# Patient Record
Sex: Female | Born: 1990 | Hispanic: No | Marital: Single | Smoking: Never smoker
Health system: Southern US, Community
[De-identification: ages and names within clinical notes are randomized; demographics above are authoritative.]

## PROBLEM LIST (undated history)

## (undated) DIAGNOSIS — G932 Benign intracranial hypertension: Secondary | ICD-10-CM

## (undated) DIAGNOSIS — Z789 Other specified health status: Secondary | ICD-10-CM

## (undated) DIAGNOSIS — D649 Anemia, unspecified: Secondary | ICD-10-CM

## (undated) DIAGNOSIS — O1410 Severe pre-eclampsia, unspecified trimester: Secondary | ICD-10-CM

## (undated) DIAGNOSIS — D6859 Other primary thrombophilia: Secondary | ICD-10-CM

## (undated) DIAGNOSIS — I82409 Acute embolism and thrombosis of unspecified deep veins of unspecified lower extremity: Secondary | ICD-10-CM

## (undated) DIAGNOSIS — Z86711 Personal history of pulmonary embolism: Secondary | ICD-10-CM

## (undated) DIAGNOSIS — I639 Cerebral infarction, unspecified: Secondary | ICD-10-CM

## (undated) HISTORY — DX: Acute embolism and thrombosis of unspecified deep veins of unspecified lower extremity: I82.409

## (undated) HISTORY — PX: NO PAST SURGERIES: SHX2092

---
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
## 2012-10-23 ENCOUNTER — Emergency Department (HOSPITAL_BASED_OUTPATIENT_CLINIC_OR_DEPARTMENT_OTHER)
Admission: EM | Admit: 2012-10-23 | Discharge: 2012-10-23 | Disposition: A | Discharge status: Home / Self Care | Payer: Self-pay | Attending: Emergency Medicine | Admitting: Emergency Medicine

## 2012-10-23 ENCOUNTER — Encounter (HOSPITAL_BASED_OUTPATIENT_CLINIC_OR_DEPARTMENT_OTHER): Payer: Self-pay | Admitting: *Deleted

## 2012-10-23 ENCOUNTER — Other Ambulatory Visit: Payer: Self-pay

## 2012-10-23 DIAGNOSIS — R55 Syncope and collapse: Secondary | ICD-10-CM | POA: Insufficient documentation

## 2012-10-23 LAB — CBC WITH DIFFERENTIAL/PLATELET
Basophils Absolute: 0.1 10*3/uL (ref 0.0–0.1)
Basophils Relative: 1 % (ref 0–1)
Eosinophils Absolute: 0.5 10*3/uL (ref 0.0–0.7)
Eosinophils Relative: 7 % — ABNORMAL HIGH (ref 0–5)
HCT: 41.5 % (ref 36.0–46.0)
Hemoglobin: 13.3 g/dL (ref 12.0–15.0)
Lymphocytes Relative: 27 % (ref 12–46)
Lymphs Abs: 1.8 10*3/uL (ref 0.7–4.0)
MCH: 26.3 pg (ref 26.0–34.0)
MCHC: 32 g/dL (ref 30.0–36.0)
MCV: 82.2 fL (ref 78.0–100.0)
Monocytes Absolute: 0.7 10*3/uL (ref 0.1–1.0)
Monocytes Relative: 11 % (ref 3–12)
Neutro Abs: 3.7 10*3/uL (ref 1.7–7.7)
Neutrophils Relative %: 55 % (ref 43–77)
Platelets: 317 10*3/uL (ref 150–400)
RBC: 5.05 MIL/uL (ref 3.87–5.11)
RDW: 14.6 % (ref 11.5–15.5)
WBC: 6.7 10*3/uL (ref 4.0–10.5)

## 2012-10-23 LAB — URINE MICROSCOPIC-ADD ON

## 2012-10-23 LAB — BASIC METABOLIC PANEL WITH GFR
BUN: 8 mg/dL (ref 6–23)
CO2: 26 meq/L (ref 19–32)
Calcium: 10.3 mg/dL (ref 8.4–10.5)
Chloride: 101 meq/L (ref 96–112)
Creatinine, Ser: 0.7 mg/dL (ref 0.50–1.10)
GFR calc Af Amer: >90 mL/min
GFR calc non Af Amer: >90 mL/min
Glucose, Bld: 88 mg/dL (ref 70–99)
Potassium: 3.8 meq/L (ref 3.5–5.1)
Sodium: 139 meq/L (ref 135–145)

## 2012-10-23 LAB — URINALYSIS, ROUTINE W REFLEX MICROSCOPIC
Bilirubin Urine: NEGATIVE
Glucose, UA: NEGATIVE mg/dL
Hgb urine dipstick: NEGATIVE
Ketones, ur: NEGATIVE mg/dL
Nitrite: NEGATIVE
Protein, ur: NEGATIVE mg/dL
Specific Gravity, Urine: 1.009 (ref 1.005–1.030)
Urobilinogen, UA: 0.2 mg/dL (ref 0.0–1.0)
pH: 7 (ref 5.0–8.0)

## 2012-10-23 LAB — HCG, QUANTITATIVE, PREGNANCY: hCG, Beta Chain, Quant, S: 1 m[IU]/mL (ref <5)

## 2012-10-23 MED ORDER — SODIUM CHLORIDE 0.9 % IV BOLUS (SEPSIS)
1000.0000 mL | Freq: Once | INTRAVENOUS | Status: AC
  Administered 2012-10-23: 1000 mL via INTRAVENOUS
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
## 2012-10-23 NOTE — ED Notes (Signed)
Per ems report of witnesses on scene, pt slid out of her chair onto the floor while at work, laid down on the floor and refused to speak to anyone. Pt responds to ammonia capsule waved under her nose, but will not answer questions or follow commands. Iv access obtained and ekg performed while pt being triaged by this rn. 3 family members now at bedside. Pt cont refuse to speak to family or this rn.

## 2012-10-23 NOTE — ED Notes (Signed)
Pt is crying, comforted by her family members.

## 2012-10-23 NOTE — ED Notes (Signed)
Pt is now awake and alert, talking with her family members. Pt denies any pain or any other c/o.

## 2012-10-23 NOTE — ED Notes (Signed)
Patient ambulates to restroom & around RN station with emt without difficulty.

## 2012-10-23 NOTE — ED Notes (Signed)
When asked if she has any pain, pt nods head to indicate "no".

## 2012-10-23 NOTE — ED Notes (Signed)
Pt to room 9 by ems via stretcher. Pt refuses to open her eyes or follow commands, when arm is raised over her head and dropped, pt moves arm to fall onto chest. Ammonia ampule waved under pt nose, she opens her eyes and states "where am I?" pt is now awake and alert, moe + x 4 ext. , resp are easy and regular.
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
## 2015-01-04 ENCOUNTER — Encounter: Payer: Self-pay | Admitting: *Deleted

## 2015-01-04 ENCOUNTER — Ambulatory Visit (INDEPENDENT_AMBULATORY_CARE_PROVIDER_SITE_OTHER): Payer: 59 | Admitting: Family Medicine

## 2015-01-04 ENCOUNTER — Ambulatory Visit (INDEPENDENT_AMBULATORY_CARE_PROVIDER_SITE_OTHER): Payer: 59

## 2015-01-04 VITALS — BP 96/64 | HR 74 | Temp 98.0°F | Resp 16 | Ht 64.5 in | Wt 115.0 lb

## 2015-01-04 DIAGNOSIS — M79645 Pain in left finger(s): Secondary | ICD-10-CM

## 2015-01-04 DIAGNOSIS — S60012A Contusion of left thumb without damage to nail, initial encounter: Secondary | ICD-10-CM

## 2015-01-04 NOTE — Progress Notes (Signed)
  Subjective:  Patient ID: Andrea Hayes, female    DOB: 12/08/1990  Age: 24 y.o. MRN: [id number]  24 year old lady who is from [place]. She works doing Education officer, environmentalcleaning. Last night at about 8 PM she closed a door on her left thumb. It is swollen some and been very painful. She is not scheduled to work today. She told her managers, and has been in phone conversation with them today, but workers compensation has not yet been filed apparently.  Menstrual cycle onset last Thursday. Denies pregnancy.   Objective:   Very tender DIP joint of the left thumb and very tender around the tip of the finger including the nailbed. The nail does not appear to be a viral syndrome at all. There is a small puncture wound in the flash of the palmar surface of the thumb tip. No other lacerations. Neurovascular appears to be intact. Too tender to really test the strength.  Assessment & Plan:   Assessment: Contusion and pain left thumb  Plan:  [facility] reading (PRIMARY) by  Dr. Alwyn RenHopper No fracture  Advised her that she needs to go ahead and make sure that she is properly filed for a workers compensation since this appears to be a workers comp injury..   There are no Patient Instructions on file for this visit.   Gianno Volner, MD 01/04/2015

## 2015-01-04 NOTE — Patient Instructions (Addendum)
I advise you to speak to your boss about getting this filed on workers compensation since it occurred at work.  Apply a baggy of ice to the fingertip for 15-30 minutes 3-4 times daily  Take ibuprofen 800 mg (available over-the-counter, 200 mg 4 equals 800 mg) 3 times daily for pain. Take with food   If you are continuing to have a great deal of pain plan to stay off work on Thursday and return Friday for a recheck  Wear the thumb protector until you're thumb is feeling better so you do not reinjure it.

## 2017-03-09 ENCOUNTER — Inpatient Hospital Stay (HOSPITAL_COMMUNITY)
Admission: AD | Admit: 2017-03-09 | Discharge: 2017-03-09 | Disposition: A | Discharge status: Home / Self Care | Payer: Self-pay | Source: Ambulatory Visit | Attending: Obstetrics & Gynecology | Admitting: Obstetrics & Gynecology

## 2017-03-09 ENCOUNTER — Encounter (HOSPITAL_COMMUNITY): Payer: Self-pay

## 2017-03-09 DIAGNOSIS — R1030 Lower abdominal pain, unspecified: Secondary | ICD-10-CM

## 2017-03-09 DIAGNOSIS — N926 Irregular menstruation, unspecified: Secondary | ICD-10-CM

## 2017-03-09 DIAGNOSIS — R109 Unspecified abdominal pain: Secondary | ICD-10-CM | POA: Insufficient documentation

## 2017-03-09 DIAGNOSIS — Z3202 Encounter for pregnancy test, result negative: Secondary | ICD-10-CM | POA: Insufficient documentation

## 2017-03-09 LAB — URINALYSIS, ROUTINE W REFLEX MICROSCOPIC
Bilirubin Urine: NEGATIVE
Glucose, UA: NEGATIVE mg/dL
Hgb urine dipstick: NEGATIVE
Ketones, ur: NEGATIVE mg/dL
Nitrite: NEGATIVE
Protein, ur: NEGATIVE mg/dL
Specific Gravity, Urine: 1.017 (ref 1.005–1.030)
pH: 7 (ref 5.0–8.0)

## 2017-03-09 LAB — WET PREP, GENITAL
Clue Cells Wet Prep HPF POC: NONE SEEN
Sperm: NONE SEEN
Trich, Wet Prep: NONE SEEN
Yeast Wet Prep HPF POC: NONE SEEN

## 2017-03-09 LAB — POCT PREGNANCY, URINE: Preg Test, Ur: NEGATIVE

## 2017-03-09 NOTE — Discharge Instructions (Signed)

## 2017-03-09 NOTE — MAU Provider Note (Signed)
History   nulparous in with abd pain that has been going on for apx 1 month. States her periods are not reg and she often skips periods. She has never been pregnant. States was on antibiotic and now has severe vag itching.  CSN: [id number]  Arrival date & time 03/09/17  1227   None     Chief Complaint  Patient presents with  . Abdominal Pain  . Vaginal Itching    HPI  No past medical history on file.  No past surgical history on file.  No family history on file.  Social History  Substance Use Topics  . Smoking status: Never Smoker  . Smokeless tobacco: Not on file  . Alcohol use Not on file    OB History    No data available      Review of Systems  Constitutional: Negative.   HENT: Negative.   Eyes: Negative.   Respiratory: Negative.   Cardiovascular: Negative.   Gastrointestinal: Positive for abdominal pain.  Endocrine: Negative.   Genitourinary: Positive for vaginal discharge.       Vag itching.  Musculoskeletal: Negative.   Skin: Negative.   Allergic/Immunologic: Negative.   Neurological: Negative.   Hematological: Negative.   Psychiatric/Behavioral: Negative.     Allergies  Patient has no known allergies.  Home Medications    BP 113/73   Pulse 80   Temp 97.8 F (36.6 C) (Oral)   Resp 16   Ht 5\' 3"  (1.6 m)   Wt 140 lb (63.5 kg)   LMP 01/14/2017   BMI 24.80 kg/m   Physical Exam  Constitutional: She is oriented to person, place, and time. She appears well-developed and well-nourished.  HENT:  Head: Normocephalic.  Eyes: Pupils are equal, round, and reactive to light.  Neck: Normal range of motion.  Cardiovascular: Normal rate, regular rhythm, normal heart sounds and intact distal pulses.   Pulmonary/Chest: Effort normal and breath sounds normal.  Abdominal: Soft. Bowel sounds are normal.  Genitourinary: Vagina normal and uterus normal.  Musculoskeletal: Normal range of motion.  Neurological: She is alert and oriented to person, place,  and time. She has normal reflexes.  Skin: Skin is warm and dry.  Psychiatric: She has a normal mood and affect. Her behavior is normal. Judgment and thought content normal.    MAU Course  Procedures (including critical care time)  Labs Reviewed  WET PREP, GENITAL  URINALYSIS, ROUTINE W REFLEX MICROSCOPIC  POCT PREGNANCY, URINE  GC/CHLAMYDIA PROBE AMP ([place]) NOT AT [facility]   No results found.   No diagnosis found.    MDM  Wet prep neg , preg test neg, VSS, exam WNL. Cultures obtained. Will d/c home to follow up with primary care.

## 2017-03-09 NOTE — MAU Note (Addendum)
Pt. States she has not had a menstrual cycle since July 16,2018. Currently she is having pain in her abdominal area and vaginal itching that started about a month ago.

## 2017-03-11 LAB — GC/CHLAMYDIA PROBE AMP (~~LOC~~) NOT AT ARMC
Chlamydia: NEGATIVE
Neisseria Gonorrhea: NEGATIVE

## 2017-07-13 ENCOUNTER — Other Ambulatory Visit: Payer: Self-pay

## 2017-07-13 ENCOUNTER — Encounter (HOSPITAL_COMMUNITY): Payer: Self-pay | Admitting: Emergency Medicine

## 2017-07-13 ENCOUNTER — Emergency Department (HOSPITAL_COMMUNITY): Payer: Self-pay

## 2017-07-13 ENCOUNTER — Emergency Department (HOSPITAL_COMMUNITY)
Admission: EM | Admit: 2017-07-13 | Discharge: 2017-07-13 | Disposition: A | Discharge status: Home / Self Care | Payer: Self-pay | Attending: Emergency Medicine | Admitting: Emergency Medicine

## 2017-07-13 DIAGNOSIS — K5900 Constipation, unspecified: Secondary | ICD-10-CM | POA: Insufficient documentation

## 2017-07-13 DIAGNOSIS — R1084 Generalized abdominal pain: Secondary | ICD-10-CM | POA: Insufficient documentation

## 2017-07-13 DIAGNOSIS — K5909 Other constipation: Secondary | ICD-10-CM

## 2017-07-13 LAB — COMPREHENSIVE METABOLIC PANEL
ALT: 23 U/L (ref 14–54)
AST: 24 U/L (ref 15–41)
Albumin: 3.7 g/dL (ref 3.5–5.0)
Alkaline Phosphatase: 51 U/L (ref 38–126)
Anion gap: 8 (ref 5–15)
BUN: 5 mg/dL — ABNORMAL LOW (ref 6–20)
CO2: 21 mmol/L — ABNORMAL LOW (ref 22–32)
Calcium: 9.2 mg/dL (ref 8.9–10.3)
Chloride: 104 mmol/L (ref 101–111)
Creatinine, Ser: 0.7 mg/dL (ref 0.44–1.00)
GFR calc Af Amer: >60 mL/min (ref >60)
GFR calc non Af Amer: >60 mL/min (ref >60)
Glucose, Bld: 89 mg/dL (ref 65–99)
Potassium: 4.2 mmol/L (ref 3.5–5.1)
Sodium: 133 mmol/L — ABNORMAL LOW (ref 135–145)
Total Bilirubin: 1.2 mg/dL (ref 0.3–1.2)
Total Protein: 7.5 g/dL (ref 6.5–8.1)

## 2017-07-13 LAB — URINALYSIS, ROUTINE W REFLEX MICROSCOPIC
Bilirubin Urine: NEGATIVE
Glucose, UA: NEGATIVE mg/dL
Hgb urine dipstick: NEGATIVE
Ketones, ur: NEGATIVE mg/dL
Leukocytes, UA: NEGATIVE
Nitrite: NEGATIVE
Protein, ur: NEGATIVE mg/dL
Specific Gravity, Urine: 1.035 — ABNORMAL HIGH (ref 1.005–1.030)
pH: 6 (ref 5.0–8.0)

## 2017-07-13 LAB — CBC
HCT: 42.1 % (ref 36.0–46.0)
Hemoglobin: 13.7 g/dL (ref 12.0–15.0)
MCH: 28.1 pg (ref 26.0–34.0)
MCHC: 32.5 g/dL (ref 30.0–36.0)
MCV: 86.3 fL (ref 78.0–100.0)
Platelets: 133 10*3/uL — ABNORMAL LOW (ref 150–400)
RBC: 4.88 MIL/uL (ref 3.87–5.11)
RDW: 13.9 % (ref 11.5–15.5)
WBC: 6.3 10*3/uL (ref 4.0–10.5)

## 2017-07-13 LAB — LIPASE, BLOOD: Lipase: 34 U/L (ref 11–51)

## 2017-07-13 LAB — I-STAT BETA HCG BLOOD, ED (MC, WL, AP ONLY): I-stat hCG, quantitative: <5 m[IU]/mL (ref <5)

## 2017-07-13 MED ORDER — SODIUM CHLORIDE 0.9 % IV BOLUS (SEPSIS)
1000.0000 mL | Freq: Once | INTRAVENOUS | Status: AC
  Administered 2017-07-13: 1000 mL via INTRAVENOUS

## 2017-07-13 MED ORDER — MORPHINE SULFATE (PF) 4 MG/ML IV SOLN
4.0000 mg | Freq: Once | INTRAVENOUS | Status: AC
  Administered 2017-07-13: 4 mg via INTRAVENOUS
  Filled 2017-07-13: qty 1

## 2017-07-13 MED ORDER — IOPAMIDOL (ISOVUE-300) INJECTION 61%
INTRAVENOUS | Status: AC
  Administered 2017-07-13: 100 mL
  Filled 2017-07-13: qty 100

## 2017-07-13 MED ORDER — DOCUSATE SODIUM 100 MG PO CAPS: 100.0000 mg | ORAL_CAPSULE | Freq: Two times a day (BID) | ORAL | 0 refills | Status: DC

## 2017-07-13 NOTE — ED Triage Notes (Signed)
Pt. Stated, Andrea Hayes had stomach pain since  Dec. 24, denies any other symptoms.

## 2017-07-13 NOTE — ED Provider Notes (Signed)
[facility] EMERGENCY DEPARTMENT Provider Note   CSN: [id number] Arrival date & time: 07/13/17  0957     History   Chief Complaint Chief Complaint  Patient presents with  . Abdominal Pain    HPI Andrea Hayes is a 27 y.o. female.  HPI Patient presents to the emergency department with generalized abdominal pain this been ongoing since December 24.  The patient states that the pain seems to come and go.  She states that her pain is diffuse throughout her entire abdomen.  Patient states she did not take any medications prior to arrival for her symptoms she states nothing seems to make the condition better or worse.  Patient states that she has not seen anyone else for the symptoms.  She states she has had similar symptoms in the past intermittently.  The patient denies chest pain, shortness of breath, headache,blurred vision, neck pain, fever, cough, weakness, numbness, dizziness, anorexia, edema,nausea, vomiting, diarrhea, rash, back pain, dysuria, hematemesis, bloody stool, near syncope, or syncope. History reviewed. No pertinent past medical history.  There are no active problems to display for this patient.   History reviewed. No pertinent surgical history.  OB History    No data available       Home Medications    Prior to Admission medications   Medication Sig Start Date End Date Taking? Authorizing Provider  nystatin-triamcinolone (MYCOLOG II) cream Apply 1 application topically 2 (two) times daily.   Yes [provider]  docusate sodium (COLACE) 100 MG capsule Take 1 capsule (100 mg total) by mouth every 12 (twelve) hours. 07/13/17   Brittinee Risk, Cristal Deer, PA-C    Family History No family history on file.  Social History Social History   Tobacco Use  . Smoking status: Never Smoker  . Smokeless tobacco: Never Used  Substance Use Topics  . Alcohol use: No    Alcohol/week: 0.0 oz    Frequency: Never  . Drug use: No     Allergies     Patient has no known allergies.   Review of Systems Review of Systems All other systems negative except as documented in the HPI. All pertinent positives and negatives as reviewed in the HPI.  Physical Exam Updated Vital Signs BP 108/78 (BP Location: Right Arm)   Pulse 89   Temp 98.6 F (37 C) (Oral)   Resp 14   Ht 5\' 3"  (1.6 m)   Wt 54.4 kg (120 lb)   LMP 06/19/2017   SpO2 99%   BMI 21.26 kg/m   Physical Exam  Constitutional: She is oriented to person, place, and time. She appears well-developed and well-nourished. No distress.  HENT:  Head: Normocephalic and atraumatic.  Mouth/Throat: Oropharynx is clear and moist.  Eyes: Pupils are equal, round, and reactive to light.  Neck: Normal range of motion. Neck supple.  Cardiovascular: Normal rate, regular rhythm and normal heart sounds. Exam reveals no gallop and no friction rub.  No murmur heard. Pulmonary/Chest: Effort normal and breath sounds normal. No respiratory distress. She has no wheezes.  Abdominal: Soft. Bowel sounds are normal. She exhibits no distension. There is generalized tenderness. There is no rigidity, no rebound and no guarding.  Neurological: She is alert and oriented to person, place, and time. She exhibits normal muscle tone. Coordination normal.  Skin: Skin is warm and dry. Capillary refill takes less than 2 seconds. No rash noted. No erythema.  Psychiatric: She has a normal mood and affect. Her behavior is normal.  Nursing note  and vitals reviewed.    ED Treatments / Results  Labs (all labs ordered are listed, but only abnormal results are displayed) Labs Reviewed  COMPREHENSIVE METABOLIC PANEL - Abnormal; Notable for the following components:      Result Value   Sodium 133 (*)    CO2 21 (*)    BUN 5 (*)    All other components within normal limits  CBC - Abnormal; Notable for the following components:   Platelets 133 (*)    All other components within normal limits  URINALYSIS, ROUTINE W  REFLEX MICROSCOPIC - Abnormal; Notable for the following components:   Color, Urine STRAW (*)    Specific Gravity, Urine 1.035 (*)    All other components within normal limits  LIPASE, BLOOD  I-STAT BETA HCG BLOOD, ED ([facility], [facility], [facility] ONLY)    EKG  EKG Interpretation None       Radiology Ct Abdomen Pelvis W Contrast  Result Date: 07/13/2017 CLINICAL DATA:  Abdominal pain since Christmas. EXAM: CT ABDOMEN AND PELVIS WITH CONTRAST TECHNIQUE: Multidetector CT imaging of the abdomen and pelvis was performed using the standard protocol following bolus administration of intravenous contrast. CONTRAST:  ISOVUE-300 IOPAMIDOL (ISOVUE-300) INJECTION 61% COMPARISON:  None. FINDINGS: Lower chest: Clear lung bases. Normal heart size without pericardial or pleural effusion. Hepatobiliary: Variant lateral segment left liver lobe extending in the left upper quadrant. Normal gallbladder, without biliary ductal dilatation. Pancreas: Normal, without mass or ductal dilatation. Spleen: Normal in size, without focal abnormality. Adrenals/Urinary Tract: Normal adrenal glands. Normal kidneys, without hydronephrosis. Normal urinary bladder. Stomach/Bowel: Normal stomach, without wall thickening. Colonic stool burden suggests constipation. Normal terminal ileum. Appendix partially visualized on image 67/series 6. Normal small bowel. Vascular/Lymphatic: Normal caliber of the aorta and branch vessels. No abdominopelvic adenopathy. Reproductive: Normal uterus. Suspect a right ovarian corpus luteal cyst, including at 1.4 cm on image 66/series 3. Other: Small volume cul-de-sac and right adnexal fluid could relate to recent cyst rupture. Musculoskeletal: No acute osseous abnormality. Variant transitional anatomy at S1. Minimal motion degradation throughout. IMPRESSION: 1. Minimally motion degraded exam. 2.  Possible constipation. 3. Probable right ovarian corpus luteal cyst. Electronically Signed   By: Jeronimo Greaves M.D.   On:  07/13/2017 13:27    Procedures Procedures (including critical care time)  Medications Ordered in ED Medications  sodium chloride 0.9 % bolus 1,000 mL (0 mLs Intravenous Stopped 07/13/17 1234)  morphine 4 MG/ML injection 4 mg (4 mg Intravenous Given 07/13/17 1225)  iopamidol (ISOVUE-300) 61 % injection (100 mLs  Contrast Given 07/13/17 1235)     Initial Impression / Assessment and Plan / ED Course  I have reviewed the triage vital signs and the nursing notes.  Pertinent labs & imaging results that were available during my care of the patient were reviewed by me and considered in my medical decision making (see chart for details).     She has negative CT scan imaging.  The patient will be referred to GI.  Patient will be referred to primary doctor told return here as needed patient has no pain at this time I do not feel there is a significant physiological issue causing her abdominal pain at this time based on her testing.  Patient agrees to the plan and all questions were answered  Final Clinical Impressions(s) / ED Diagnoses   Final diagnoses:  Generalized abdominal pain  Other constipation    ED Discharge Orders        Ordered  docusate sodium (COLACE) 100 MG capsule  Every 12 hours     07/13/17 1455       Charlestine NightLawyer, Damaree Sargent, PA-C 07/14/17 1535    Doug SouJacubowitz, Sam, MD 07/14/17 1537

## 2017-07-13 NOTE — ED Notes (Signed)
Patient transported to CT 

## 2017-07-13 NOTE — ED Notes (Signed)
ED Provider at bedside. 

## 2017-07-13 NOTE — Discharge Instructions (Signed)
Use Miralax daily. Return here as needed. Follow up with a primary doctor..Marland Kitchen

## 2018-06-13 ENCOUNTER — Emergency Department (HOSPITAL_COMMUNITY)
Admission: EM | Admit: 2018-06-13 | Discharge: 2018-06-13 | Disposition: A | Discharge status: Home / Self Care | Payer: BLUE CROSS/BLUE SHIELD | Attending: Emergency Medicine | Admitting: Emergency Medicine

## 2018-06-13 ENCOUNTER — Emergency Department (HOSPITAL_COMMUNITY): Payer: BLUE CROSS/BLUE SHIELD

## 2018-06-13 DIAGNOSIS — Z5320 Procedure and treatment not carried out because of patient's decision for unspecified reasons: Secondary | ICD-10-CM | POA: Diagnosis not present

## 2018-06-13 DIAGNOSIS — R51 Headache: Secondary | ICD-10-CM | POA: Insufficient documentation

## 2018-06-13 DIAGNOSIS — R519 Headache, unspecified: Secondary | ICD-10-CM

## 2018-06-13 DIAGNOSIS — H538 Other visual disturbances: Secondary | ICD-10-CM | POA: Diagnosis present

## 2018-06-13 DIAGNOSIS — G932 Benign intracranial hypertension: Secondary | ICD-10-CM | POA: Diagnosis not present

## 2018-06-13 LAB — CBC WITH DIFFERENTIAL/PLATELET
Abs Immature Granulocytes: 0.02 10*3/uL (ref 0.00–0.07)
Basophils Absolute: 0 10*3/uL (ref 0.0–0.1)
Basophils Relative: 1 %
Eosinophils Absolute: 0.4 10*3/uL (ref 0.0–0.5)
Eosinophils Relative: 6 %
HCT: 41.5 % (ref 36.0–46.0)
Hemoglobin: 12.7 g/dL (ref 12.0–15.0)
Immature Granulocytes: 0 %
Lymphocytes Relative: 24 %
Lymphs Abs: 1.4 10*3/uL (ref 0.7–4.0)
MCH: 26.7 pg (ref 26.0–34.0)
MCHC: 30.6 g/dL (ref 30.0–36.0)
MCV: 87.2 fL (ref 80.0–100.0)
Monocytes Absolute: 0.6 10*3/uL (ref 0.1–1.0)
Monocytes Relative: 10 %
Neutro Abs: 3.6 10*3/uL (ref 1.7–7.7)
Neutrophils Relative %: 59 %
Platelets: 190 10*3/uL (ref 150–400)
RBC: 4.76 MIL/uL (ref 3.87–5.11)
RDW: 14.6 % (ref 11.5–15.5)
WBC: 6.1 10*3/uL (ref 4.0–10.5)
nRBC: 0 % (ref 0.0–0.2)

## 2018-06-13 LAB — BASIC METABOLIC PANEL
Anion gap: 9 (ref 5–15)
BUN: 9 mg/dL (ref 6–20)
CO2: 24 mmol/L (ref 22–32)
Calcium: 9.2 mg/dL (ref 8.9–10.3)
Chloride: 106 mmol/L (ref 98–111)
Creatinine, Ser: 0.69 mg/dL (ref 0.44–1.00)
GFR calc Af Amer: >60 mL/min (ref >60)
GFR calc non Af Amer: >60 mL/min (ref >60)
Glucose, Bld: 106 mg/dL — ABNORMAL HIGH (ref 70–99)
Potassium: 3.9 mmol/L (ref 3.5–5.1)
Sodium: 139 mmol/L (ref 135–145)

## 2018-06-13 MED ORDER — GADOBUTROL 1 MMOL/ML IV SOLN
6.0000 mL | Freq: Once | INTRAVENOUS | Status: AC | PRN
  Administered 2018-06-13: 6 mL via INTRAVENOUS

## 2018-06-13 NOTE — ED Triage Notes (Signed)
[facility] times 1 month

## 2018-06-13 NOTE — ED Notes (Signed)
Pt returned from MRI °

## 2018-06-13 NOTE — ED Notes (Signed)
Patient verbalizes understanding of discharge instructions. Opportunity for questioning and answers were provided. Armband removed by staff, pt discharged from ED ambulatory w/ husband  

## 2018-06-13 NOTE — ED Provider Notes (Signed)
[facility] EMERGENCY DEPARTMENT Provider Note   CSN: [id number] Arrival date & time: 06/13/18  1106     History   Chief Complaint Chief Complaint  Patient presents with  . Blurred Vision    HPI Andrea Hayes is a 27 y.o. female who presents to the ED with complaints of 2 months of blurry vision, difficulty adjusting to light, and intermittent headaches.  She was seen by Dr. Arvil Chaco, Montez Hageman. of the eye consultants of 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  She states that her vision has slowly deteriorated and that occasionally she cannot see very well when she turns on lights, she describes it as a blurry vision.  No known aggravating or alleviating factors, no treatments tried.  She also reports intermittent headaches.  She denies any double vision or loss of vision, spots in her vision, dizziness, lightheadedness, fevers, chills, CP, SOB, abd pain, N/V/D/C, hematuria, dysuria, myalgias, arthralgias, numbness, tingling, focal weakness, or any other complaints at this time.   The history is provided by the patient and medical records. No language interpreter was used.    No past medical history on file.  There are no active problems to display for this patient.   No past surgical history on file.   OB History   No obstetric history on file.      Home Medications    Prior to Admission medications   Medication Sig Start Date End Date Taking? Authorizing Provider  docusate sodium (COLACE) 100 MG capsule Take 1 capsule (100 mg total) by mouth every 12 (twelve) hours. 07/13/17   Lawyer, Cristal Deer, PA-C  nystatin-triamcinolone (MYCOLOG II) cream Apply 1 application topically 2 (two) times daily.    [provider]    Family History No family history on file.  Social  History Social History   Tobacco Use  . Smoking status: Never Smoker  . Smokeless tobacco: Never Used  Substance Use Topics  . Alcohol use: No    Alcohol/week: 0.0 standard drinks    Frequency: Never  . Drug use: No     Allergies   Patient has no known allergies.   Review of Systems Review of Systems  Constitutional: Negative for chills and fever.  Eyes: Positive for visual disturbance.  Respiratory: Negative for shortness of breath.   Cardiovascular: Negative for chest pain.  Gastrointestinal: Negative for abdominal pain, constipation, diarrhea, nausea and vomiting.  Genitourinary: Negative for dysuria and hematuria.  Musculoskeletal: Negative for arthralgias and myalgias.  Skin: Negative for color change.  Allergic/Immunologic: Negative for immunocompromised state.  Neurological: Positive for headaches. Negative for dizziness, weakness, light-headedness and numbness.  Psychiatric/Behavioral: Negative for confusion.   All other systems reviewed and are negative for acute change except as noted in the HPI.    Physical Exam Updated Vital Signs BP 99/66 (BP Location: Right Arm)   Pulse 85   Temp 97.7 F (36.5 C) (Oral)   Resp 16   Ht 5\' 6"  (1.676 m)   Wt 63.5 kg   SpO2 99%   BMI 22.60 kg/m   Physical Exam Vitals signs and nursing note reviewed.  Constitutional:      General: She is not in acute distress.    Appearance: Normal appearance. She is well-developed. She is not toxic-appearing.     Comments: Afebrile, nontoxic, NAD  HENT:     Head: Normocephalic and atraumatic.  Eyes:     General: Vision grossly intact. No visual field deficit.       Right eye: No discharge.        Left eye: No discharge.     Extraocular Movements: Extraocular movements intact.     Conjunctiva/sclera: Conjunctivae normal.     Pupils: Pupils are equal, round, and reactive to light.     Comments: PERRL, EOMI, no nystagmus, unable to evaluate for papilledema due to undilated exam    Neck:     Musculoskeletal: Normal range of motion and neck supple. Normal range of motion. No neck rigidity, spinous process tenderness or muscular tenderness.     Comments: FROM intact without spinous process TTP, no bony stepoffs or deformities, no paraspinous muscle TTP or muscle spasms. No rigidity or meningeal signs. No bruising or swelling.  Cardiovascular:     Rate and Rhythm: Normal rate and regular rhythm.     Heart sounds: Normal heart sounds. No murmur. No friction rub. No gallop.   Pulmonary:     Effort: Pulmonary effort is normal. No respiratory distress.     Breath sounds: Normal breath sounds. No decreased breath sounds, wheezing, rhonchi or rales.  Abdominal:     General: Bowel sounds are normal. There is no distension.     Palpations: Abdomen is soft. Abdomen is not rigid.     Tenderness: There is no abdominal tenderness. There is no guarding or rebound. Negative signs include Murphy's sign and McBurney's sign.  Musculoskeletal: Normal range of motion.     Comments: MAE x4 Strength and sensation grossly intact in all extremities Distal pulses intact Gait steady  Skin:    General: Skin is warm and dry.     Findings: No rash.  Neurological:     General: No focal deficit present.     Mental Status: She is alert and oriented to person, place, and time.     GCS: GCS eye subscore is 4. GCS verbal subscore is 5. GCS motor subscore is 6.     Cranial Nerves: No cranial nerve deficit.     Sensory: Sensation is intact. No sensory deficit.     Motor: Motor function is intact.     Coordination: Coordination is intact. Coordination normal.     Gait: Gait normal.     Comments: CN 2-12 grossly intact A&O x4 GCS 15 Sensation and strength intact Gait nonataxic including with tandem walking Coordination with finger-to-nose WNL Neg pronator drift   Psychiatric:        Mood and Affect: Mood and affect normal.        Behavior: Behavior normal.      ED Treatments / Results   Labs (all labs ordered are listed, but only abnormal results are displayed) Labs Reviewed  BASIC METABOLIC PANEL - Abnormal; Notable for the following components:      Result Value   Glucose, Bld 106 (*)    All other components within normal limits  CBC WITH DIFFERENTIAL/PLATELET    EKG None  Radiology Mr Laqueta JeanBrain W And Wo Contrast  Result Date: 06/13/2018 CLINICAL DATA:  Headaches and blurred vision over the last 2 months. Papilledema. EXAM: MRI HEAD WITHOUT AND WITH CONTRAST TECHNIQUE: Multiplanar, multiecho pulse sequences of the brain and surrounding structures were obtained without and with intravenous contrast. CONTRAST:  6 cc Gadavist COMPARISON:  None. FINDINGS: Brain: The brain itself has a normal appearance without evidence of malformation, atrophy, old  or acute infarction, mass lesion, hemorrhage, hydrocephalus or extra-axial collection. No abnormal contrast enhancement occurs. Vascular: Major vessels at the base of the brain show flow. Skull and upper cervical spine: Negative Sinuses/Orbits: Sinuses are clear. Orbits are normal except for some tortuosity of the optic nerves with prominence of the fluid in the optic nerve sheaths. This can be seen with intracranial hypertension. Other: None IMPRESSION: Normal appearance of the brain itself. Tortuous optic nerves with prominent optic nerve sheath fluid, which can be seen with intracranial hypertension. Electronically Signed   By: Paulina Fusi M.D.   On: 06/13/2018 16:17    Procedures Procedures (including critical care time)  Medications Ordered in ED Medications  gadobutrol (GADAVIST) 1 MMOL/ML injection 6 mL (6 mLs Intravenous Contrast Given 06/13/18 1611)     Initial Impression / Assessment and Plan / ED Course  I have reviewed the triage vital signs and the nursing notes.  Pertinent labs & imaging results that were available during my care of the patient were reviewed by me and considered in my medical decision making  (see chart for details).     27 y.o. female here with 2 months of blurred vision and intermittent headaches, was sent here by her eye doctor for MRI because papilledema showed up on her eye exam.  Physical exam benign here, papilledema unable to be evaluated due to on dilated eye exam, no focal neurologic deficits noted.  Given that her eye specialist recommended MRI, will proceed with this, will get basic labs, and reassess shortly. Discussed case with my attending Dr. Adriana Simas who agrees with plan.   4:31 PM CBC w/diff WNL. BMP WNL. MRI brain showing tortuous optic nerve with prominent optic nerve sheath fluid which can be seen with intracranial hypertension. Will consult neurology. Pt not yet back from MRI, will update when she returns.   5:35 PM Dr. 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 I explained this to the pt, regarding performing LP today, and she does not want this done. I informed the pt that her issue could lead to permanent profound vision loss, and although she understands this, she prefers to not have an LP done today. I asked the pt if she would like a translator to ensure she fully understands the gravity of the situation, and she declined this service, states she understands and does not want an interpreter. She would like to f/up outpatient, requests that we give her the f/up information. I advised that I will have my attending also come speak with her to ensure she has fully understood the situation before she makes a decision. Will have my current attending Dr. Madilyn Hook go speak with the pt.   6:31 PM Myself and my attending Dr. Madilyn Hook both spoke with pt, she still wishes to leave AGAINST MEDICAL  ADVICE. I discussed with her that she needs to f/up with neurology ASAP, ambulatory referral was placed. Advised that she could lose her vision or have progressively worsening vision loss, and she still wants to leave AMA. I explained the diagnosis and have given explicit precautions to return to the ER including for any other new or worsening symptoms. The patient understands and accepts the medical plan as it's been  dictated and I have answered their questions. The patient is STABLE and is discharged to home AGAINST MEDICAL ADVICE.    Final Clinical Impressions(s) / ED Diagnoses   Final diagnoses:  Blurred vision, bilateral  Generalized headaches  Intracranial hypertension    ED Discharge Orders         Ordered    Ambulatory referral to Neurology    Comments:  An appointment is requested in approximately: 1 week   06/13/18 [address], [place], [place] 06/13/18 1832    Donnetta Hutching, MD 06/16/18 1308

## 2018-06-13 NOTE — Discharge Instructions (Addendum)
YOU HAVE DECIDED TO LEAVE AGAINST MEDICAL ADVICE BEFORE THE ENTIRE WORK UP IS COMPLETED. YOU MUST FOLLOW UP WITH NEUROLOGY AS SOON AS POSSIBLE. They will need to perform further testing on you to continue your evaluation. Please keep in mind that your vision may worsen and you could have permanent vision loss and blindness if you don't take care of the issue you have going on. Please call the neurologist on Monday and set up an appointment as soon as possible. Return to the ER for emergent changes or worsening symptoms.

## 2018-06-29 NOTE — Progress Notes (Signed)
NEUROLOGY CONSULTATION NOTE  Armanda Magicissatou Tisdell MRN: [id number] DOB: 09/11/1990  Referring provider: Rhona RaiderMercedes Street, PA-C (ED) Primary care provider: No PCP  Reason for consult:  Headaches, papilledema  HISTORY OF PRESENT ILLNESS: Andrea Hayes is a 27 year old female who presents for papilledema and headache.  History supplemented by referring provider's note.  She began experiencing blurred vision and intermittent headaches on top of head.  In October.  She also reports visual obscurations as well.  No pulsatile tinnitus.  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  The next day she went to the ED for further evaluation and treatment.  MRI of brain with and without contrast was personally reviewed and demonstrated tortuous optic nerves with prominent optic nerve sheath fluid which may be seen in setting of intracranial hypertension.  Neurology was consulted and LP was recommended.  However, she declined the procedure and left against medical advice.   CBC and BMP from 06/13/18 were unremarkable.  PAST MEDICAL HISTORY: No past medical history on file.  PAST SURGICAL HISTORY: No past surgical history on file.  MEDICATIONS: Current Outpatient Medications on File Prior to Visit  Medication Sig Dispense Refill  . docusate sodium (COLACE) 100 MG capsule Take 1 capsule (100 mg total) by mouth every 12 (twelve) hours. (Patient not taking: Reported on 06/13/2018) 60 capsule 0  . ibuprofen (ADVIL,MOTRIN) 200 MG tablet Take 200 mg by mouth every 6 (six) hours as needed for moderate pain.    Marland Kitchen. nystatin-triamcinolone (MYCOLOG II) cream Apply 1 application topically 2 (two) times daily.     No current facility-administered medications on file prior to visit.     ALLERGIES: No Known Allergies  FAMILY HISTORY: No family history on file.  SOCIAL HISTORY: Social History   Socioeconomic History  . Marital status: Single    Spouse name: Not on file  . Number of  children: Not on file  . Years of education: Not on file  . Highest education level: Not on file  Occupational History  . Not on file  Social Needs  . Financial resource strain: Not on file  . Food insecurity:    Worry: Not on file    Inability: Not on file  . Transportation needs:    Medical: Not on file    Non-medical: Not on file  Tobacco Use  . Smoking status: Never Smoker  . Smokeless tobacco: Never Used  Substance and Sexual Activity  . Alcohol use: No    Alcohol/week: 0.0 standard drinks    Frequency: Never  . Drug use: No  . Sexual activity: Not on file  Lifestyle  . Physical activity:    Days per week: Not on file    Minutes per session: Not on file  . Stress: Not on file  Relationships  . Social connections:    Talks on phone: Not on file    Gets together: Not on file    Attends religious service: Not on file    Active member of club or organization: Not on file    Attends meetings of clubs or organizations: Not on file    Relationship status: Not on file  . Intimate partner violence:    Fear of current or ex partner: Not on file    Emotionally abused: Not on file    Physically abused: Not on file    Forced sexual activity: Not on file  Other Topics Concern  . Not on file  Social History  Narrative  . Not on file    REVIEW OF SYSTEMS: Constitutional: No fevers, chills, or sweats, no generalized fatigue, change in appetite Eyes: as above Ear, nose and throat: No hearing loss, ear pain, nasal congestion, sore throat Cardiovascular: No chest pain, palpitations Respiratory:  No shortness of breath at rest or with exertion, wheezes GastrointestinaI: No nausea, vomiting, diarrhea, abdominal pain, fecal incontinence Genitourinary:  No dysuria, urinary retention or frequency Musculoskeletal:  No neck pain, back pain Integumentary: No rash, pruritus, skin lesions Neurological: as above Psychiatric: No depression, insomnia, anxiety Endocrine: No palpitations,  fatigue, diaphoresis, mood swings, change in appetite, change in weight, increased thirst Hematologic/Lymphatic:  No purpura, petechiae. Allergic/Immunologic: no itchy/runny eyes, nasal congestion, recent allergic reactions, rashes  PHYSICAL EXAM: Blood pressure 90/60, pulse 96, height 5' 4.5" (1.638 m), weight 136 lb 4 oz (61.8 kg), SpO2 97 %. General: No acute distress.  Patient appears well-groomed.   Head:  Normocephalic/atraumatic Eyes:  fundi examined but not visualized Neck: supple, no paraspinal tenderness, full range of motion Back: No paraspinal tenderness Heart: regular rate and rhythm Lungs: Clear to auscultation bilaterally. Vascular: No carotid bruits. Neurological Exam: Mental status: alert and oriented to person, place, and time, recent and remote memory intact, fund of knowledge intact, attention and concentration intact, speech fluent and not dysarthric, language intact. Cranial nerves: CN I: not tested CN II: pupils equal, round and reactive to light, visual fields intact CN III, IV, VI:  full range of motion, no nystagmus, no ptosis CN V: facial sensation intact CN VII: upper and lower face symmetric CN VIII: hearing intact CN IX, X: gag intact, uvula midline CN XI: sternocleidomastoid and trapezius muscles intact CN XII: tongue midline Bulk & Tone: normal, no fasciculations. Motor:  5/5 throughout  Sensation: temperature and vibration sensation intact. Deep Tendon Reflexes:  2+ throughout, toes downgoing.   Finger to nose testing:  Without dysmetria.   Heel to shin:  Without dysmetria.   Gait:  Normal station and stride.  Romberg negative.  IMPRESSION: Papilledema, intermittent headache, visual obscurations.  Concern for intracranial hypertension  PLAN: 1.  Will schedule lumbar puncture to assess opening pressure.  CSF analysis for cell count, gram stain and culture, protein and glucose. 2.  Check MRV of head to rule out venous sinus thrombosis. 3.   Pending results, will likely start acetazolamide 500mg  twice daily 4.  Follow up after tesing.  Thank you for allowing me to take part in the care of this patient.  Shon MilletAdam Calisa Luckenbaugh, DO

## 2018-06-30 ENCOUNTER — Ambulatory Visit (INDEPENDENT_AMBULATORY_CARE_PROVIDER_SITE_OTHER): Payer: BLUE CROSS/BLUE SHIELD | Admitting: Neurology

## 2018-06-30 ENCOUNTER — Encounter: Payer: Self-pay | Admitting: Neurology

## 2018-06-30 ENCOUNTER — Encounter

## 2018-06-30 VITALS — BP 90/60 | HR 96 | Ht 64.5 in | Wt 136.2 lb

## 2018-06-30 DIAGNOSIS — H547 Unspecified visual loss: Secondary | ICD-10-CM

## 2018-06-30 DIAGNOSIS — H4710 Unspecified papilledema: Secondary | ICD-10-CM

## 2018-06-30 NOTE — Patient Instructions (Addendum)
1.  We need to first order a spinal tap to check the pressure of your spinal fluid.  We will also check spinal fluid for cell count, protein, glucose and gram stain and culture. 2.  We will also check MRV of brain to look at the blood vessels 3.  Follow up after testing.   [facility] and [facility] and they will call you directly to schedule your appt. They are located at [address] If you need to contact them directly please call [phone].

## 2018-07-09 ENCOUNTER — Telehealth: Payer: Self-pay

## 2018-07-09 ENCOUNTER — Ambulatory Visit
Admission: RE | Admit: 2018-07-09 | Discharge: 2018-07-09 | Disposition: A | Discharge status: Home / Self Care | Payer: BLUE CROSS/BLUE SHIELD | Source: Ambulatory Visit | Attending: Neurology | Admitting: Neurology

## 2018-07-09 VITALS — BP 106/74 | HR 71

## 2018-07-09 DIAGNOSIS — H547 Unspecified visual loss: Secondary | ICD-10-CM

## 2018-07-09 MED ORDER — ACETAZOLAMIDE 250 MG PO TABS: 500.0000 mg | ORAL_TABLET | Freq: Two times a day (BID) | ORAL | 3 refills | Status: DC

## 2018-07-09 NOTE — Telephone Encounter (Signed)
Called and spoke with Pt, advised her of LP results and to start acetazolamide 500 mg BID. I advised her of possible side effects and confirmed pharmacy

## 2018-07-09 NOTE — Discharge Instructions (Signed)

## 2018-07-09 NOTE — Telephone Encounter (Signed)
-----   Message from Van Clines, MD sent at 07/09/2018 12:11 PM EST ----- Pls let her know the spinal tap confirmed elevated pressure, Dr. Everlena Cooper wanted to start acetazolamide 500mg  BID after LP, pls send in Rx. Pls inform her of side effects,most commonly paresthesias. Thanks

## 2018-07-12 ENCOUNTER — Ambulatory Visit
Admission: RE | Admit: 2018-07-12 | Discharge: 2018-07-12 | Disposition: A | Discharge status: Home / Self Care | Payer: BLUE CROSS/BLUE SHIELD | Source: Ambulatory Visit | Attending: Neurology | Admitting: Neurology

## 2018-07-12 DIAGNOSIS — H547 Unspecified visual loss: Secondary | ICD-10-CM

## 2018-07-12 DIAGNOSIS — H4710 Unspecified papilledema: Secondary | ICD-10-CM

## 2018-07-14 LAB — CSF CELL COUNT WITH DIFFERENTIAL
RBC Count, CSF: 2 cells/uL (ref 0–10)
WBC, CSF: 2 cells/uL (ref 0–5)

## 2018-07-14 LAB — CSF CULTURE W GRAM STAIN
MICRO NUMBER:: 27643
Result:: NO GROWTH
SPECIMEN QUALITY:: ADEQUATE

## 2018-07-14 LAB — PROTEIN, CSF: Total Protein, CSF: 23 mg/dL (ref 15–45)

## 2018-07-14 LAB — GLUCOSE, CSF: Glucose, CSF: 57 mg/dL (ref 40–80)

## 2018-07-15 ENCOUNTER — Telehealth: Payer: Self-pay

## 2018-07-15 NOTE — Telephone Encounter (Signed)
Called and spoke with Pt, advised her of imaging results. Made follow up appointment. Pt states headaches are still resent and extremely painful, wanted to know if there was a medication she could take.  Ok to start the azetazolamide 500 mg BID?

## 2018-07-15 NOTE — Telephone Encounter (Signed)
-----   Message from Glendale Chard, DO sent at 07/14/2018  7:50 AM EST ----- Please inform patient that her MR venogram is normal.  No signs of blood clots or blood vessel problems causing increased pressure.

## 2018-07-15 NOTE — Telephone Encounter (Signed)
Yes, please inform her to start Diamox 500mg  BID, this is to treat her headaches.

## 2018-07-16 ENCOUNTER — Telehealth: Payer: Self-pay | Admitting: Neurology

## 2018-07-16 NOTE — Telephone Encounter (Signed)
Patient returned your call.  Thanks!

## 2018-07-16 NOTE — Telephone Encounter (Signed)
Called and LMOVM advising Pt if has not started azetazolamide, to do so for her headaches.

## 2018-07-24 ENCOUNTER — Other Ambulatory Visit: Payer: Self-pay

## 2018-07-24 ENCOUNTER — Inpatient Hospital Stay (HOSPITAL_COMMUNITY)
Admission: EM | Admit: 2018-07-24 | Discharge: 2018-07-29 | DRG: 093 | Disposition: A | Discharge status: Home / Self Care | Payer: BLUE CROSS/BLUE SHIELD | Attending: Neurology | Admitting: Neurology

## 2018-07-24 ENCOUNTER — Encounter (HOSPITAL_COMMUNITY): Payer: Self-pay

## 2018-07-24 ENCOUNTER — Emergency Department (HOSPITAL_COMMUNITY): Payer: BLUE CROSS/BLUE SHIELD

## 2018-07-24 ENCOUNTER — Inpatient Hospital Stay (HOSPITAL_COMMUNITY): Payer: BLUE CROSS/BLUE SHIELD

## 2018-07-24 DIAGNOSIS — I63511 Cerebral infarction due to unspecified occlusion or stenosis of right middle cerebral artery: Secondary | ICD-10-CM | POA: Diagnosis not present

## 2018-07-24 DIAGNOSIS — G08 Intracranial and intraspinal phlebitis and thrombophlebitis: Secondary | ICD-10-CM | POA: Diagnosis present

## 2018-07-24 DIAGNOSIS — E876 Hypokalemia: Secondary | ICD-10-CM | POA: Diagnosis not present

## 2018-07-24 DIAGNOSIS — R51 Headache: Secondary | ICD-10-CM | POA: Diagnosis present

## 2018-07-24 DIAGNOSIS — I959 Hypotension, unspecified: Secondary | ICD-10-CM | POA: Diagnosis present

## 2018-07-24 DIAGNOSIS — Z79899 Other long term (current) drug therapy: Secondary | ICD-10-CM

## 2018-07-24 DIAGNOSIS — D62 Acute posthemorrhagic anemia: Secondary | ICD-10-CM | POA: Diagnosis not present

## 2018-07-24 DIAGNOSIS — Z793 Long term (current) use of hormonal contraceptives: Secondary | ICD-10-CM | POA: Diagnosis not present

## 2018-07-24 DIAGNOSIS — G932 Benign intracranial hypertension: Secondary | ICD-10-CM | POA: Diagnosis present

## 2018-07-24 LAB — I-STAT BETA HCG BLOOD, ED (MC, WL, AP ONLY): I-stat hCG, quantitative: <5 m[IU]/mL (ref <5)

## 2018-07-24 LAB — BASIC METABOLIC PANEL
Anion gap: 9 (ref 5–15)
BUN: 10 mg/dL (ref 6–20)
CO2: 16 mmol/L — ABNORMAL LOW (ref 22–32)
Calcium: 9.6 mg/dL (ref 8.9–10.3)
Chloride: 113 mmol/L — ABNORMAL HIGH (ref 98–111)
Creatinine, Ser: 0.7 mg/dL (ref 0.44–1.00)
GFR calc Af Amer: >60 mL/min (ref >60)
GFR calc non Af Amer: >60 mL/min (ref >60)
Glucose, Bld: 107 mg/dL — ABNORMAL HIGH (ref 70–99)
Potassium: 3.5 mmol/L (ref 3.5–5.1)
Sodium: 138 mmol/L (ref 135–145)

## 2018-07-24 LAB — CBC WITH DIFFERENTIAL/PLATELET
Abs Immature Granulocytes: 0.03 10*3/uL (ref 0.00–0.07)
Basophils Absolute: 0 10*3/uL (ref 0.0–0.1)
Basophils Relative: 0 %
Eosinophils Absolute: 0 10*3/uL (ref 0.0–0.5)
Eosinophils Relative: 0 %
HCT: 41.6 % (ref 36.0–46.0)
Hemoglobin: 13 g/dL (ref 12.0–15.0)
Immature Granulocytes: 0 %
Lymphocytes Relative: 10 %
Lymphs Abs: 0.9 10*3/uL (ref 0.7–4.0)
MCH: 26.5 pg (ref 26.0–34.0)
MCHC: 31.3 g/dL (ref 30.0–36.0)
MCV: 84.7 fL (ref 80.0–100.0)
Monocytes Absolute: 0.4 10*3/uL (ref 0.1–1.0)
Monocytes Relative: 5 %
Neutro Abs: 7.7 10*3/uL (ref 1.7–7.7)
Neutrophils Relative %: 85 %
Platelets: 163 10*3/uL (ref 150–400)
RBC: 4.91 MIL/uL (ref 3.87–5.11)
RDW: 15.7 % — ABNORMAL HIGH (ref 11.5–15.5)
WBC: 9.1 10*3/uL (ref 4.0–10.5)
nRBC: 0 % (ref 0.0–0.2)

## 2018-07-24 LAB — ANTITHROMBIN III: AntiThromb III Func: 128 % — ABNORMAL HIGH (ref 75–120)

## 2018-07-24 LAB — PROTIME-INR
INR: 1.09
Prothrombin Time: 14 seconds (ref 11.4–15.2)

## 2018-07-24 LAB — APTT: aPTT: 23 seconds — ABNORMAL LOW (ref 24–36)

## 2018-07-24 MED ORDER — HEPARIN (PORCINE) 25000 UT/250ML-% IV SOLN
1150.0000 [IU]/h | INTRAVENOUS | Status: AC
  Administered 2018-07-25 – 2018-07-26 (×2): 1100 [IU]/h via INTRAVENOUS
  Filled 2018-07-24 (×3): qty 250

## 2018-07-24 MED ORDER — HEPARIN BOLUS VIA INFUSION
4000.0000 [IU] | Freq: Once | INTRAVENOUS | Status: DC
  Filled 2018-07-24: qty 4000

## 2018-07-24 MED ORDER — HEPARIN (PORCINE) 25000 UT/250ML-% IV SOLN: 1100.0000 [IU]/h | INTRAVENOUS | Status: DC

## 2018-07-24 MED ORDER — IOHEXOL 300 MG/ML  SOLN
75.0000 mL | Freq: Once | INTRAMUSCULAR | Status: AC | PRN
  Administered 2018-07-24: 75 mL via INTRAVENOUS

## 2018-07-24 MED ORDER — ACETAMINOPHEN 325 MG PO TABS: 650.0000 mg | ORAL_TABLET | ORAL | Status: DC | PRN

## 2018-07-24 MED ORDER — STROKE: EARLY STAGES OF RECOVERY BOOK
Freq: Once | Status: AC
  Administered 2018-07-25: 01:00:00
  Filled 2018-07-24: qty 1

## 2018-07-24 MED ORDER — SENNOSIDES-DOCUSATE SODIUM 8.6-50 MG PO TABS
1.0000 | ORAL_TABLET | Freq: Every evening | ORAL | Status: DC | PRN
  Administered 2018-07-26 – 2018-07-27 (×2): 1 via ORAL
  Filled 2018-07-24 (×2): qty 1

## 2018-07-24 MED ORDER — ACETAMINOPHEN 160 MG/5ML PO SOLN: 650.0000 mg | ORAL | Status: DC | PRN

## 2018-07-24 MED ORDER — ACETAMINOPHEN 650 MG RE SUPP: 650.0000 mg | RECTAL | Status: DC | PRN

## 2018-07-24 MED ORDER — SODIUM CHLORIDE 0.9 % IV SOLN
INTRAVENOUS | Status: DC
  Administered 2018-07-25 – 2018-07-28 (×8): via INTRAVENOUS

## 2018-07-24 NOTE — ED Provider Notes (Signed)
[facility] Provider Note   CSN: [id number] Arrival date & time: 07/24/18  1805     History   Chief Complaint Chief Complaint  Patient presents with  . Headache    HPI Andrea Hayes is a 28 y.o. female who presents to ED for frontal headache, dizziness, blurry vision that began 36 hours ago.  States that she woke up with the symptoms yesterday.  She has had 3 episodes of vomiting today.  She has not tried any medications at home to help with her symptoms.  She was seen and evaluated in the ED on 06/13/2018 and had negative MRI of her brain.  She left AMA prior to further work-up.  She did follow-up with neurology on 06/30/2018. MRV of her head was done on 07/12/2017 which was negative. LP was also done with increased OP of 40.  She was given Diamox 500 mg twice a day.  She felt better until yesterday.  She denies any injuries or falls, numbness in arms or legs, shortness of breath, fever or neck pain.  HPI  History reviewed. No pertinent past medical history.  There are no active problems to display for this patient.   History reviewed. No pertinent surgical history.   OB History   No obstetric history on file.      Home Medications    Prior to Admission medications   Medication Sig Start Date End Date Taking? Authorizing Provider  acetaZOLAMIDE (DIAMOX) 250 MG tablet Take 2 tablets (500 mg total) by mouth 2 (two) times daily. 07/09/18   Drema DallasJaffe, Adam R, DO  ibuprofen (ADVIL,MOTRIN) 200 MG tablet Take 200 mg by mouth every 6 (six) hours as needed for moderate pain.    [provider]    Family History History reviewed. No pertinent family history.  Social History Social History   Tobacco Use  . Smoking status: Never Smoker  . Smokeless tobacco: Never Used  Substance Use Topics  . Alcohol use: No    Alcohol/week: 0.0 standard drinks    Frequency: Never  . Drug use: No     Allergies   Patient has no known  allergies.   Review of Systems Review of Systems  Constitutional: Negative for appetite change, chills and fever.  HENT: Negative for ear pain, rhinorrhea, sneezing and sore throat.   Eyes: Positive for visual disturbance. Negative for photophobia.  Respiratory: Negative for cough, chest tightness, shortness of breath and wheezing.   Cardiovascular: Negative for chest pain and palpitations.  Gastrointestinal: Positive for vomiting. Negative for abdominal pain, blood in stool, constipation, diarrhea and nausea.  Genitourinary: Negative for dysuria, hematuria and urgency.  Musculoskeletal: Negative for myalgias.  Skin: Negative for rash.  Neurological: Positive for dizziness and headaches. Negative for weakness and light-headedness.     Physical Exam Updated Vital Signs BP 108/81   Pulse (!) 53   Temp 98.2 F (36.8 C) (Oral)   Resp (!) 21   Wt 63.5 kg   LMP 07/20/2018   SpO2 100%   BMI 23.66 kg/m   Physical Exam Vitals signs and nursing note reviewed.  Constitutional:      General: She is not in acute distress.    Appearance: She is well-developed.  HENT:     Head: Normocephalic and atraumatic.     Nose: Nose normal.  Eyes:     General: No scleral icterus.       Left eye: No discharge.     Conjunctiva/sclera: Conjunctivae normal.  Pupils: Pupils are equal, round, and reactive to light.  Neck:     Musculoskeletal: Normal range of motion and neck supple.  Cardiovascular:     Rate and Rhythm: Normal rate and regular rhythm.     Heart sounds: Normal heart sounds. No murmur. No friction rub. No gallop.   Pulmonary:     Effort: Pulmonary effort is normal. No respiratory distress.     Breath sounds: Normal breath sounds.  Abdominal:     General: Bowel sounds are normal. There is no distension.     Palpations: Abdomen is soft.     Tenderness: There is no abdominal tenderness. There is no guarding.  Musculoskeletal: Normal range of motion.  Skin:    General: Skin is  warm and dry.     Findings: No rash.  Neurological:     Mental Status: She is alert.     Cranial Nerves: No cranial nerve deficit.     Sensory: No sensory deficit.     Motor: No abnormal muscle tone.     Coordination: Coordination normal.     Comments: Alert and oriented x3. Unable to perform finger to nose coordination secondary to blurry vision. No facial asymmetry noted. No sensory deficit of face, bilateral upper and lower extremities.  Ambulatory.      ED Treatments / Results  Labs (all labs ordered are listed, but only abnormal results are displayed) Labs Reviewed  BASIC METABOLIC PANEL - Abnormal; Notable for the following components:      Result Value   Chloride 113 (*)    CO2 16 (*)    Glucose, Bld 107 (*)    All other components within normal limits  CBC WITH DIFFERENTIAL/PLATELET - Abnormal; Notable for the following components:   RDW 15.7 (*)    All other components within normal limits  CBC WITH DIFFERENTIAL/PLATELET  CBC  ANTITHROMBIN III  PROTEIN C ACTIVITY  PROTEIN C, TOTAL  PROTEIN S ACTIVITY  PROTEIN S, TOTAL  LUPUS ANTICOAGULANT PANEL  BETA-2-GLYCOPROTEIN I ABS, IGG/M/A  HOMOCYSTEINE  FACTOR 5 LEIDEN  PROTHROMBIN GENE MUTATION  CARDIOLIPIN ANTIBODIES, IGG, IGM, IGA  SICKLE CELL SCREEN  HEPARIN LEVEL (UNFRACTIONATED)  I-STAT BETA HCG BLOOD, ED ([facility], [facility], [facility] ONLY)    EKG None  Radiology Ct Head Wo Contrast  Result Date: 07/24/2018 CLINICAL DATA:  28 y/o  F; severe headache. EXAM: CT HEAD WITHOUT CONTRAST TECHNIQUE: Contiguous axial images were obtained from the base of the skull through the vertex without intravenous contrast. COMPARISON:  07/12/2018 MRV head. FINDINGS: Brain: No evidence of acute infarction, hemorrhage, hydrocephalus, extra-axial collection or mass lesion/mass effect. Vascular: Increased density within the posterior aspect of superior sagittal sinus, torcula, straight sinus, right transverse sinus, right sigmoid sinus, a large  posterior falcine vein, the right basal vein of Rosenthal, and the left internal cerebral vein compatible with dural venous sinus thrombosis. Skull: Normal. Negative for fracture or focal lesion. Sinuses/Orbits: No acute finding. Other: None. IMPRESSION: 1. 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 2. No acute abnormality of the brain parenchyma identified. These results were called by telephone at the time of interpretation on 07/24/2018 at 7:36 pm to Dr. Silverio LayYao, who verbally acknowledged these results. Electronically Signed   By: Mitzi HansenLance  Furusawa-Stratton M.D.   On: 07/24/2018 19:51    Procedures Procedures (including critical care time)  CRITICAL CARE Performed by: Dietrich PatesHina Mirelle Biskup   Total critical care  time: 45 minutes  Critical care time was exclusive of separately billable procedures and treating other patients.  Critical care was necessary to treat or prevent imminent or life-threatening deterioration.  Critical care was time spent personally by me on the following activities: development of treatment plan with patient and/or surrogate as well as nursing, discussions with consultants, evaluation of patient's response to treatment, examination of patient, obtaining history from patient or surrogate, ordering and performing treatments and interventions, ordering and review of laboratory studies, ordering and review of radiographic studies, pulse oximetry and re-evaluation of patient's condition.   Medications Ordered in ED Medications  heparin ADULT infusion 100 units/mL (25000 units/262mL sodium chloride 0.45%) (has no administration in time range)     Initial Impression / Assessment and Plan / ED Course  I have reviewed the triage vital signs and the nursing notes.  Pertinent labs & imaging results that were available during my care of the patient were reviewed  by me and considered in my medical decision making (see chart for details).  Clinical Course as of Jul 25 2219  Thu Jul 24, 2018  2143 Neurology, Dr. Laurence Slate states that he will put in heparin orders as well as other lab work which needs to be completed before heparin.  He will be in to evaluate patient.   [HK]    Clinical Course User Index [HK] Dietrich Pates, PA-C    28 year old female with a past medical history of intracranial hypertension presents to ED complaining of a frontal headache, vomiting, dizziness and lightheadedness since yesterday.  Patient was seen and evaluated for similar complaints last month, negative MRI.  She was evaluated by neurology about 3 weeks ago with a negative MRV.  She had an LP done at the neurologist office showing opening pressure of 40.  States that she felt better with the Diamox given.  She returns today for ongoing symptoms since yesterday.  CT of the head done in triage shows dural venous sinus thrombosis.  I talked to Dr. Jerrell Belfast of neurology who will consult on the patient, asked that we obtain lab work as noted above, placed on heparin drip, obtain CT venogram of the head and admit to neuro ICU. Dr. Laurence Slate to admit.     Portions of this note were generated with Scientist, clinical (histocompatibility and immunogenetics). Dictation errors may occur despite best attempts at proofreading.  Final Clinical Impressions(s) / ED Diagnoses   Final diagnoses:  Dural venous sinus thrombosis    ED Discharge Orders    None       Dietrich Pates, PA-C 07/24/18 2221    Benjiman Core, MD 07/25/18 0001

## 2018-07-24 NOTE — Progress Notes (Addendum)
ANTICOAGULATION CONSULT NOTE - Initial Consult  Pharmacy Consult for heparin Indication: sinus thrombus  No Known Allergies  Patient Measurements: Weight: 140 lb (63.5 kg)  Vital Signs: Temp: 98.2 F (36.8 C) (01/23 2052) Temp Source: Oral (01/23 2052) BP: 110/68 (01/23 2115) Pulse Rate: 44 (01/23 2115)  Labs: No results for input(s): HGB, HCT, PLT, APTT, LABPROT, INR, HEPARINUNFRC, HEPRLOWMOCWT, CREATININE, CKTOTAL, CKMB, TROPONINI in the last 72 hours.  CrCl cannot be calculated (Patient's most recent lab result is older than the maximum 21 days allowed.).  Assessment: CC/HPI: 28 yo f presenting with severe frontal HA  PMH: idiopathic intracranial HTN  Anticoag: none pta - venous sinus thrombosis on ct head  Goal of Therapy:  Heparin level 0.3-0.7 units/ml Monitor platelets by anticoagulation protocol: Yes   Plan:  No bolus per neuro; but asked for frequent checks to get therapeutic faster, to start after hypercoagulable labs Heparin gtt 1100 units/hr Initial HL 0400 Daily HL CBC F/U oral ac plans/w/u  Isaac Bliss, PharmD, BCPS, BCCCP Clinical Pharmacist [phone]  Please check AMION for all [facility] numbers

## 2018-07-24 NOTE — ED Notes (Signed)
Lab to add on PT INR STAT per Neurology, if results in normal range, start Heparin infusion

## 2018-07-24 NOTE — ED Triage Notes (Signed)
Pt endorses severe frontal ha that began this morning with vomiting. No neuro deficits.

## 2018-07-25 LAB — RAPID URINE DRUG SCREEN, HOSP PERFORMED
Amphetamines: NOT DETECTED
Barbiturates: NOT DETECTED
Benzodiazepines: NOT DETECTED
Cocaine: NOT DETECTED
Opiates: POSITIVE — AB
Tetrahydrocannabinol: NOT DETECTED

## 2018-07-25 LAB — CBC
HCT: 38.9 % (ref 36.0–46.0)
Hemoglobin: 12.6 g/dL (ref 12.0–15.0)
MCH: 26.7 pg (ref 26.0–34.0)
MCHC: 32.4 g/dL (ref 30.0–36.0)
MCV: 82.4 fL (ref 80.0–100.0)
Platelets: 161 10*3/uL (ref 150–400)
RBC: 4.72 MIL/uL (ref 3.87–5.11)
RDW: 15.4 % (ref 11.5–15.5)
WBC: 8.7 10*3/uL (ref 4.0–10.5)
nRBC: 0 % (ref 0.0–0.2)

## 2018-07-25 LAB — LIPID PANEL
Cholesterol: 111 mg/dL (ref 0–200)
HDL: 50 mg/dL (ref >40)
LDL Cholesterol: 51 mg/dL (ref 0–99)
Total CHOL/HDL Ratio: 2.2 RATIO
Triglycerides: 50 mg/dL (ref <150)
VLDL: 10 mg/dL (ref 0–40)

## 2018-07-25 LAB — PREGNANCY, URINE: Preg Test, Ur: NEGATIVE

## 2018-07-25 LAB — HEPARIN LEVEL (UNFRACTIONATED)
Heparin Unfractionated: 0.13 IU/mL — ABNORMAL LOW (ref 0.30–0.70)
Heparin Unfractionated: 0.46 IU/mL (ref 0.30–0.70)
Heparin Unfractionated: 0.51 IU/mL (ref 0.30–0.70)

## 2018-07-25 LAB — MRSA PCR SCREENING: MRSA by PCR: NEGATIVE

## 2018-07-25 LAB — HEMOGLOBIN A1C
Hgb A1c MFr Bld: 5.4 % (ref 4.8–5.6)
Mean Plasma Glucose: 108.28 mg/dL

## 2018-07-25 MED ORDER — ACETAZOLAMIDE 250 MG PO TABS
500.0000 mg | ORAL_TABLET | Freq: Two times a day (BID) | ORAL | Status: DC
  Administered 2018-07-25 – 2018-07-29 (×9): 500 mg via ORAL
  Filled 2018-07-25 (×11): qty 2

## 2018-07-25 MED ORDER — SODIUM CHLORIDE 0.9 % IV BOLUS: 500.0000 mL | Freq: Once | INTRAVENOUS | Status: DC

## 2018-07-25 MED ORDER — WARFARIN - PHARMACIST DOSING INPATIENT
Freq: Every day | Status: DC
  Administered 2018-07-28: 17:00:00

## 2018-07-25 MED ORDER — ONDANSETRON HCL 4 MG/2ML IJ SOLN
4.0000 mg | Freq: Four times a day (QID) | INTRAMUSCULAR | Status: DC | PRN
  Administered 2018-07-25: 4 mg via INTRAVENOUS
  Filled 2018-07-25: qty 2

## 2018-07-25 MED ORDER — TOPIRAMATE 25 MG PO TABS
25.0000 mg | ORAL_TABLET | Freq: Two times a day (BID) | ORAL | Status: DC
  Administered 2018-07-25 – 2018-07-26 (×3): 25 mg via ORAL
  Filled 2018-07-25 (×3): qty 1

## 2018-07-25 MED ORDER — ACETAMINOPHEN-CODEINE #3 300-30 MG PO TABS
1.0000 | ORAL_TABLET | Freq: Four times a day (QID) | ORAL | Status: DC | PRN
  Administered 2018-07-25 – 2018-07-26 (×4): 1 via ORAL
  Filled 2018-07-25 (×4): qty 1

## 2018-07-25 MED ORDER — ONDANSETRON HCL 4 MG/2ML IJ SOLN: 4.0000 mg | Freq: Four times a day (QID) | INTRAMUSCULAR | Status: DC

## 2018-07-25 MED ORDER — WARFARIN SODIUM 5 MG PO TABS
5.0000 mg | ORAL_TABLET | Freq: Once | ORAL | Status: AC
  Administered 2018-07-25: 5 mg via ORAL
  Filled 2018-07-25: qty 1

## 2018-07-25 MED ORDER — SODIUM CHLORIDE 0.9 % IV BOLUS
500.0000 mL | Freq: Once | INTRAVENOUS | Status: AC
  Administered 2018-07-25: 500 mL via INTRAVENOUS

## 2018-07-25 NOTE — Evaluation (Signed)
Physical Therapy Evaluation Patient Details Name: Andrea Hayes MRN: [id number] DOB: [birth year] Today's Date: 07/25/2018   History of Present Illness  28 y.o. female with past medical history of newly diagnosed idiopathic intracranial hypertension started on Diamox presents to the ER with severe headache, generalized weakness. Pt reports headache is continuous, dull and associated with nausea and vomiting and photophobia. 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  Clinical Impression  Pt admitted with/for severe headache and found to have new dx of idiopathic intracranial HTN with dural venous sinus thrombosis.  Pt is now not at her functional baseline.  She is needing minimal to light moderate assist from basic mobility and gait and she is not answering questions reliably..  Pt currently limited functionally due to the problems listed. ( See problems list.)   Pt will benefit from PT to maximize function and safety in order to get ready for next venue listed below.     Follow Up Recommendations CIR;Supervision/Assistance - 24 hour    Equipment Recommendations  Other (comment)(TBA)    Recommendations for Other Services Rehab consult     Precautions / Restrictions Precautions Precautions: Fall Restrictions Weight Bearing Restrictions: No      Mobility  Bed Mobility Overal bed mobility: Needs Assistance Bed Mobility: Supine to Sit;Sit to Supine     Supine to sit: Min assist Sit to supine: Min guard      Transfers Overall transfer level: Needs assistance Equipment used: 1 person hand held assist Transfers: Sit to/from Stand Sit to Stand: Min assist         General transfer comment: steady assist  Ambulation/Gait Ambulation/Gait assistance: Min assist(to light mod assist with fatigue ) Gait Distance (Feet): 200 Feet Assistive  device: 1 person hand held assist Gait Pattern/deviations: Step-through pattern Gait velocity: moderate to slower Gait velocity interpretation: <1.8 ft/sec, indicate of risk for recurrent falls General Gait Details: mildly unsteady overall, with drifting and scissoring.  Pt not scanning or interact unless spoken to.  Stairs            Wheelchair Mobility    Modified Rankin (Stroke Patients Only)       Balance Overall balance assessment: Needs assistance Sitting-balance support: Single extremity supported;Bilateral upper extremity supported Sitting balance-Leahy Scale: Poor Sitting balance - Comments: listing off to the Left in bed.  Could be due to just getting up for the first time.     Standing balance-Leahy Scale: Poor Standing balance comment: reliant on external support                             Pertinent Vitals/Pain Pain Assessment: Faces Faces Pain Scale: Hurts a little bit Pain Location: pt not able to specify where, generalized Pain Descriptors / Indicators: Grimacing(maintains eyes closed intermittently) Pain Intervention(s): Monitored during session    Home Living Family/patient expects to be discharged to:: Private residence Living Arrangements: Parent Available Help at Discharge: Family Type of Home: Other(Comment)           Additional Comments: pt unable at this time to relate her home situation or/ potential plans for post discharge    Prior Function           Comments: pt unable to accurately respond to questions regarding home setup, pt's mother present during session and per RN father has also been  present; 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, suspect she is most likely independent PTA      Hand Dominance        Extremity/Trunk Assessment   Upper Extremity Assessment Upper Extremity Assessment: Generalized weakness    Lower Extremity Assessment Lower Extremity Assessment: Generalized weakness(no  discernable focal weakness with MMT not tested formally)       Communication   Communication: Prefers language other than English  Cognition Arousal/Alertness: Lethargic;Awake/alert(intermittently keeps eyes closed) Behavior During Therapy: Flat affect Overall Cognitive Status: Impaired/Different from baseline Area of Impairment: Safety/judgement;Following commands;Orientation                 Orientation Level: Disoriented to;Place;Situation     Following Commands: Follows one step commands inconsistently;Follows one step commands with increased time Safety/Judgement: Decreased awareness of safety;Decreased awareness of deficits     General Comments: difficult to fully assess due to pt non-english speaking, though able to state simple words/phrases; pt stating she needs to see a doctor (after given cues related to orientation/location); pt often responds yes to all questions; states she is 28 when asked       General Comments      Exercises     Assessment/Plan    PT Assessment Patient needs continued PT services  PT Problem List Decreased strength;Decreased activity tolerance;Decreased balance;Decreased mobility;Decreased cognition;Decreased safety awareness;Pain       PT Treatment Interventions DME instruction;Gait training;Functional mobility training;Stair training;Therapeutic activities;Balance training;Patient/family education    PT Goals (Current goals can be found in the Care Plan section)  Acute Rehab PT Goals Patient Stated Goal: pt unable to participate with goals PT Goal Formulation: Patient unable to participate in goal setting Time For Goal Achievement: 08/08/18 Potential to Achieve Goals: Good    Frequency Min 3X/week   Barriers to discharge        Co-evaluation PT/OT/SLP Co-Evaluation/Treatment: Yes Reason for Co-Treatment: For patient/therapist safety PT goals addressed during session: Mobility/safety with mobility OT goals addressed during  session: Strengthening/ROM;ADL's and self-care       AM-PAC PT "6 Clicks" Mobility  Outcome Measure Help needed turning from your back to your side while in a flat bed without using bedrails?: A Little Help needed moving from lying on your back to sitting on the side of a flat bed without using bedrails?: A Little Help needed moving to and from a bed to a chair (including a wheelchair)?: A Little Help needed standing up from a chair using your arms (e.g., wheelchair or bedside chair)?: A Little Help needed to walk in hospital room?: A Lot Help needed climbing 3-5 steps with a railing? : A Lot 6 Click Score: 16    End of Session   Activity Tolerance: Patient tolerated treatment well Patient left: in bed;with call bell/phone within reach;with family/visitor present Nurse Communication: Mobility status PT Visit Diagnosis: Unsteadiness on feet (R26.81);Muscle weakness (generalized) (M62.81);Pain Pain - part of body: (headache)    Time: [id number] PT Time Calculation (min) (ACUTE ONLY): 24 min   Charges:   PT Evaluation $PT Eval Moderate Complexity: 1 Mod          07/25/2018  Perryton BingKen Ahad Colarusso, PT Acute Rehabilitation Services [phone]  (pager) [phone]  (office)  Eliseo GumKenneth V Meka Lewan 07/25/2018, 4:08 PM

## 2018-07-25 NOTE — Progress Notes (Signed)
BP soft; 85/45 (57). MD aware. Orders for a 500cc bolus. Family at the bedside. Justen Fonda, [place] Scrape, RN

## 2018-07-25 NOTE — Progress Notes (Addendum)
STROKE TEAM PROGRESS NOTE   INTERVAL HISTORY Her mother is at the bedside.  [place] from [place], speaks [place]. She complains of a HA - no help from plan tylenol. Will add additional prn meds and add topamax. On IV heparin, start warfarin per pharmacy. No hx miscarriage, use of BCPs. [place] she was not sick there. Denies any other medical issues. Her father arrived at the end of rounds. Dr. Pearlean BrownieSethi went back to discuss dx and treatment with him as well.  Vitals:   07/25/18 0500 07/25/18 0600 07/25/18 0700 07/25/18 0800  BP: 90/62 93/61 (!) 84/50 (!) 90/52  Pulse: (!) 56 84 (!) 48 (!) 50  Resp: (!) 27 14 11 14   Temp:      TempSrc:      SpO2: 100% 100% 100% 100%  Weight:        CBC:  Recent Labs  Lab 07/24/18 2148 07/25/18 0140  WBC 9.1 8.7  NEUTROABS 7.7  --   HGB 13.0 12.6  HCT 41.6 38.9  MCV 84.7 82.4  PLT 163 161    Basic Metabolic Panel:  Recent Labs  Lab 07/24/18 2148  NA 138  K 3.5  CL 113*  CO2 16*  GLUCOSE 107*  BUN 10  CREATININE 0.70  CALCIUM 9.6   Lipid Panel:     Component Value Date/Time   CHOL 111 07/25/2018 0140   TRIG 50 07/25/2018 0140   HDL 50 07/25/2018 0140   CHOLHDL 2.2 07/25/2018 0140   VLDL 10 07/25/2018 0140   LDLCALC 51 07/25/2018 0140   HgbA1c:  Lab Results  Component Value Date   HGBA1C 5.4 07/25/2018   Urine Drug Screen: No results found for: LABOPIA, COCAINSCRNUR, LABBENZ, AMPHETMU, THCU, LABBARB  Alcohol Level No results found for: [facility]ETH  IMAGING Dg Chest 2 View  Result Date: 07/24/2018 CLINICAL DATA:  28 year old female with headache and nausea. EXAM: CHEST - 2 VIEW COMPARISON:  None. FINDINGS: The lungs are clear. There is no pleural effusion or pneumothorax. The cardiac silhouette is within normal limits. No acute osseous pathology. Excreted contrast within the renal collecting system. IMPRESSION: No active cardiopulmonary disease. Electronically Signed   By: Elgie CollardArash  Radparvar M.D.    On: 07/24/2018 23:21   Ct Head Wo Contrast  Result Date: 07/24/2018 CLINICAL DATA:  28 y/o  F; severe headache. EXAM: CT HEAD WITHOUT CONTRAST TECHNIQUE: Contiguous axial images were obtained from the base of the skull through the vertex without intravenous contrast. COMPARISON:  07/12/2018 MRV head. FINDINGS: Brain: No evidence of acute infarction, hemorrhage, hydrocephalus, extra-axial collection or mass lesion/mass effect. Vascular: Increased density within the posterior aspect of superior sagittal sinus, torcula, straight sinus, right transverse sinus, right sigmoid sinus, a large posterior falcine vein, the right basal vein of Rosenthal, and the left internal cerebral vein compatible with dural venous sinus thrombosis. Skull: Normal. Negative for fracture or focal lesion. Sinuses/Orbits: No acute finding. Other: None. IMPRESSION: 1. 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 2. No acute abnormality of the brain parenchyma identified. These results were called by telephone at the time of interpretation on 07/24/2018 at 7:36 pm to Dr. Silverio LayYao, who verbally acknowledged these results. Electronically Signed   By: Mitzi HansenLance  Furusawa-Stratton M.D.   On: 07/24/2018 19:51   Ct Venogram Head  Result Date: 07/24/2018 CLINICAL DATA:  Follow-up suspected dural venous sinus  thrombosis. EXAM: CT VENOGRAM HEAD TECHNIQUE: CT HEAD July 24, 2018 at 1913 hours CONTRAST:  31mL OMNIPAQUE IOHEXOL 300 MG/ML  SOLN COMPARISON:  CT HEAD July 24, 2018 at 1913 hours. FINDINGS: Intrinsically hypodense thrombus from proximal superior sagittal sinus extending into torcula parotid Foley, RIGHT transverse and sigmoid sinus. In addition, thrombus within the internal cerebral veins including straight vein. Contrast surrounds the thrombus within the superior sagittal sinus with patent remaining superior  sagittal sinus. IMPRESSION: 1. Acute dural venous sinus thrombosis from superior sagittal sinus to RIGHT sigmoid sinus and, within internal cerebral veins. 2. Critical Value/emergent results text paged to Dr.AROOR, Neurology via AMION secure system on 07/24/2018 at 11:23 pm, including interpreting physician's phone number. Electronically Signed   By: Awilda Metro M.D.   On: 07/24/2018 23:24    PHYSICAL EXAM Per Dr. Pearlean Brownie    ASSESSMENT/PLAN Ms. Freda Amborn is a 28 y.o. female with history of newly diagnosed idiopathic intracranial hypertension started on Diamox presenting with severe headache and generalized weakness with nausea, vomiting and photophobia.   Dural venous sinus thrombosis Headache, secondary  CT head dural venous sinus thrombosis involving superior sagittal sinus, torcula, straight sinus, right transverse sinus, right sigmoid sinus and several small branching veins  CT VENOGRAM acute dural venous sinus thrombosis from superior sagittal sinus to right sigmoid sinus and within intracerebral veins  CXR no active disease follow  LDL 51  HgbA1c 5.4  Has SCD and IV heparin for VTE prophylaxis. d/c SCDs  No antithrombotic prior to admission, now on heparin IV. Will ask pharmacy to dose warfarin for transition from IV heparin. D/c when INR therapeutic  Increase IVF  to 125/h  Add tylenol. #3 for HA management as well as topamax 25 bid  Resume diamox Friday 1/25  Therapy recommendations:  pending   Disposition:  pending - anticipate return home  Idiopathic intracranial hypertension   On Diamox   Followed by Dr. Everlena Cooper   hypotension  90-110s  Asymptomatic. Appears baseline  1x saline bolus w/ SBP 84  Hospital day # 1  Annie Main, MSN, APRN, ANVP-BC, AGPCNP-BC Advanced Practice Stroke Nurse [facility] See Amion for Schedule & Pager information 07/25/2018 2:22 PM  I have personally obtained history,examined this patient, reviewed notes,  independently viewed imaging studies, participated in medical decision making and plan of care.ROS completed by me personally and pertinent positives fully documented  I have made any additions or clarifications directly to the above note. Agree with note above. .she presented with subacute headaches secondary to benign intracranial Hypertension likely from sagittal and lateral sinus thrombosis.recommend anticoagulation with IV heparin and start warfarin with target INR goal of 2-3 for at least 6-9 months. Check hypercoagulable panel labs and if abnormal may need longer anticoagulation duration. Maintain aggressive hydration. Add Topamax 25 mg twice daily for headaches as well as continue Diamox for intracranial hypertension.long discussion with patient, mother and father and answered questions. This patient is critically ill and at significant risk of neurological worsening, death and care requires constant monitoring of vital signs, hemodynamics,respiratory and cardiac monitoring, extensive review of multiple databases, frequent neurological assessment, discussion with family, other specialists and medical decision making of high complexity.I have made any additions or clarifications directly to the above note.This critical care time does not reflect procedure time, or teaching time or supervisory time of PA/NP/Med Resident etc but could involve care discussion time.  I spent 45 minutes of neurocritical care time  in the care of  this patient.  Delia HeadyPramod Albertus Chiarelli, MD Medical Director [facility] Pager: [phone] 07/25/2018 3:57 PM  To contact Stroke Continuity provider, please refer to [email or web address]. After hours, contact General Neurology

## 2018-07-25 NOTE — Progress Notes (Addendum)
ANTICOAGULATION CONSULT NOTE - Initial Consult  Pharmacy Consult for heparin / warfarin Indication: sinus thrombus  Patient Measurements: Weight: 126 lb 12.2 oz (57.5 kg)  Vital Signs: Temp: 99 F (37.2 C) (01/24 1200) Temp Source: Axillary (01/24 1200) BP: 97/66 (01/24 1200) Pulse Rate: 71 (01/24 1200)  Labs: Recent Labs    07/24/18 2148 07/24/18 2251 07/25/18 0140 07/25/18 0636  HGB 13.0  --  12.6  --   HCT 41.6  --  38.9  --   PLT 163  --  161  --   APTT  --  23*  --   --   LABPROT  --  14.0  --   --   INR  --  1.09  --   --   HEPARINUNFRC  --   --  0.13* 0.46  CREATININE 0.70  --   --   --     Assessment: CC/HPI: 28 yo f presenting with severe frontal headache  She was started on heparin for venous sinus thrombosis. Planning now to initiate warfarin. Baseline INR 1. Heparin level this morning is therapeutic. She was not taking any anticoagulation prior to admission.   Goal of Therapy:  Heparin level 0.3-0.7 units/ml Monitor platelets by anticoagulation protocol: Yes    Plan:  -Continue heparin at 1100 units/hr -Daily HL, CBC -Warfarin 5 mg po x1 -Daily INR -Discontinue heparin when INR > 2   Baldemar FridayMasters, Odella Appelhans M 07/25/2018 12:26 PM  Addendum -Confirmatory heparin level is therapeutic -Next level with AM labs  Baldemar FridayMasters, Bianco Cange M 07/25/2018 2:37 PM

## 2018-07-25 NOTE — H&P (Signed)
Chief Complaint: Headache, weakness  History obtained from: Patient and Chart     HPI:                                                                                                                                       Andrea Hayes is an 28 y.o. female with past medical history of newly diagnosed idiopathic intracranial hypertension started on Diamox presents to the ER with severe headache that began this morning as well as generalized weakness.  Headache is continuous, dull and associated with nausea and vomiting and photophobia.  She underwent a CT head shows dural venous sinus thrombosis involving the superior sagittal sinus, straight sinus transverse sinus on the right side as well as sigmoid sinus and smaller branching veins.  Recently under and MR venogram on 07/12/2018 which is negative for any venous thrombosis.  She has been having having intermittent headaches and evaluation by outpatient neurologist showed papilledema.  She saw Dr. Everlena Cooper who diagnosed her with idiopathic intracranial hypertension and started her on Diamox after opening pressure was 47 on LP performed on 1/8 /2020.    PMH:  Idiopathic intracranial hypertension   History reviewed. No pertinent surgical history.  Family History  No family h/o of blood clots/stroke/multiple miscarriages  Social History:  reports that she has never smoked. She has never used smokeless tobacco. She reports that she does not drink alcohol or use drugs.  Allergies: No Known Allergies  Medications:                                                                                                                        I reviewed home medications   ROS:  14 systems reviewed and negative except above   Examination:                                                                                                       General: Appears well-developed  Psych: Affect appropriate to situation Eyes: No scleral injection HENT: No OP obstrucion Head: Normocephalic.  Cardiovascular: Normal rate and regular rhythm.  Respiratory: Effort normal and breath sounds normal to anterior ascultation GI: Soft.  No distension. There is no tenderness.  Skin: WDI    Neurological Examination Mental Status: Alert, oriented, thought content appropriate.  Speech fluent without evidence of aphasia. Able to follow 3 step commands without difficulty. Cranial Nerves: II: Visual fields grossly normal,  III,IV, VI: ptosis not present, extra-ocular motions intact bilaterally, pupils equal, round, reactive to light and accommodation V,VII: smile symmetric, facial light touch sensation normal bilaterally VIII: hearing normal bilaterally IX,X: uvula rises symmetrically XI: bilateral shoulder shrug XII: midline tongue extension Motor: Right : Upper extremity   5/5    Left:     Upper extremity   4+/5  Lower extremity   5/5     Lower extremity   4/5 Tone and bulk:normal tone throughout; no atrophy noted Sensory: Pinprick and light touch intact throughout, bilaterally Deep Tendon Reflexes: 2+ and symmetric throughout Plantars: Right: downgoing   Left: downgoing Cerebellar: Mild ataxia on the left upper and lower extremity Gait: normal gait and station     Lab Results: Basic Metabolic Panel: Recent Labs  Lab 07/24/18 2148  NA 138  K 3.5  CL 113*  CO2 16*  GLUCOSE 107*  BUN 10  CREATININE 0.70  CALCIUM 9.6    CBC: Recent Labs  Lab 07/24/18 2148 07/25/18 0140  WBC 9.1 8.7  NEUTROABS 7.7  --   HGB 13.0 12.6  HCT 41.6 38.9  MCV 84.7 82.4  PLT 163 161    Coagulation Studies: Recent Labs    07/24/18 2251  LABPROT 14.0  INR 1.09    Imaging: Dg Chest 2 View  Result Date: 07/24/2018 CLINICAL DATA:  28 year old female with headache and nausea. EXAM: CHEST - 2 VIEW COMPARISON:  None. FINDINGS:  The lungs are clear. There is no pleural effusion or pneumothorax. The cardiac silhouette is within normal limits. No acute osseous pathology. Excreted contrast within the renal collecting system. IMPRESSION: No active cardiopulmonary disease. Electronically Signed   By: Elgie CollardArash  Radparvar M.D.   On: 07/24/2018 23:21   Ct Head Wo Contrast  Result Date: 07/24/2018 CLINICAL DATA:  28 y/o  F; severe headache. EXAM: CT HEAD WITHOUT CONTRAST TECHNIQUE: Contiguous axial images were obtained from the base of the skull through the vertex without intravenous contrast. COMPARISON:  07/12/2018 MRV head. FINDINGS: Brain: No evidence of acute infarction, hemorrhage, hydrocephalus, extra-axial collection or mass lesion/mass effect. Vascular: Increased density within the posterior aspect of superior sagittal sinus, torcula, straight sinus, right transverse sinus, right sigmoid sinus, a large posterior falcine vein, the right basal vein of Rosenthal, and the left internal cerebral vein compatible with dural venous sinus thrombosis. Skull: Normal.  Negative for fracture or focal lesion. Sinuses/Orbits: No acute finding. Other: None. IMPRESSION: 1. 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 2. No acute abnormality of the brain parenchyma identified. These results were called by telephone at the time of interpretation on 07/24/2018 at 7:36 pm to Dr. Silverio Lay, who verbally acknowledged these results. Electronically Signed   By: Mitzi Hansen M.D.   On: 07/24/2018 19:51   Ct Venogram Head  Result Date: 07/24/2018 CLINICAL DATA:  Follow-up suspected dural venous sinus thrombosis. EXAM: CT VENOGRAM HEAD TECHNIQUE: CT HEAD July 24, 2018 at 1913 hours CONTRAST:  50mL OMNIPAQUE IOHEXOL 300 MG/ML  SOLN COMPARISON:  CT HEAD July 24, 2018 at 1913 hours. FINDINGS: Intrinsically hypodense thrombus from  proximal superior sagittal sinus extending into torcula parotid Foley, RIGHT transverse and sigmoid sinus. In addition, thrombus within the internal cerebral veins including straight vein. Contrast surrounds the thrombus within the superior sagittal sinus with patent remaining superior sagittal sinus. IMPRESSION: 1. Acute dural venous sinus thrombosis from superior sagittal sinus to RIGHT sigmoid sinus and, within internal cerebral veins. 2. Critical Value/emergent results text paged to Dr.Jeriah Skufca, Neurology via AMION secure system on 07/24/2018 at 11:23 pm, including interpreting physician's phone number. Electronically Signed   By: Awilda Metro M.D.   On: 07/24/2018 23:24     ASSESSMENT AND PLAN  28 y.o. female with past medical history of newly diagnosed idiopathic intracranial hypertension started on Diamox.  Presented with headache this morning. CT head shows extensive sinus venous thrombosis.   Discussed MR venogram performed on 1/11 with Dr. Grace Isaac -agrees that there was no thrombus noted on MRV, however sagittal sinus appeared abnormal and and feels patient may have had previous sinus venous thrombus with recanalization left transverse sinus stenosis.  Likely Diamox may have precipitated this event.  We will hold off Diamox and start patient on heparin drip.  Sinus venous thrombosis -Start therapeutic anticoagulation with heparin drip -Neurochecks every 4 -Admit to neuro ICU -We will check hypercoagulable panel -Will discontinue Diamox   Please page stroke NP  Or  PA  Or MD from 8am -4 pm  as this patient from this time will be  followed by the stroke.   You can look them up on [email or web address]  Password [email or web address]    This patient is neurologically critically ill due to extensive cerebral venous thrombosis.  She is at risk for significant risk of neurological worsening from cerebral edema, hemorrhagic conversion, infection, respiratory failure and seizure. This patient's care requires constant  monitoring of vital signs, hemodynamics, respiratory and cardiac monitoring, review of multiple databases, neurological assessment, discussion with family, other specialists and medical decision making of high complexity.  I spent 50  minutes of neurocritical time in the care of this patient.     Ceclia Koker [facility] Pager Number [phone]

## 2018-07-25 NOTE — Progress Notes (Signed)
Rehab Admissions Coordinator Note:  Patient was screened by Clois Dupes for appropriateness for an Inpatient Acute Rehab Consult per PT and SLP recommendations.  At this time, we are recommending Inpatient Rehab consult if Dr. Pearlean Brownie feels appropriate. Please advise.  Clois Dupes RN MSN 07/25/2018, 4:30 PM  I can be reached at [phone].

## 2018-07-25 NOTE — Evaluation (Signed)
Speech Language Pathology Evaluation Patient Details Name: Andrea Hayes MRN: [id number] DOB: [birth year] Today's Date: 07/25/2018 Time: 3536-1443 SLP Time Calculation (min) (ACUTE ONLY): 15 min  Problem List:  Patient Active Problem List   Diagnosis Date Noted  . Cerebral venous thrombosis 07/24/2018   Past Medical History: History reviewed. No pertinent past medical history. Past Surgical History: History reviewed. No pertinent surgical history. HPI:  Andrea Hayes is an 28 y.o. female with past medical history of newly diagnosed idiopathic intracranial hypertension started on Diamox presents to the ER with severe headache and generalized weakness associated with nausea and vomiting and photophobia. 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   Assessment / Plan / Recommendation Clinical Impression  Pt was unable to sustain attention or achieve alert state via verbal and tactile measures with SLP. PT/OT arrived and facilitated movement stimulating pt's reticular activating system. Accompanied pt as she walked around unit; requiring cues to open eyes; pt is lethargic as well as photophobic. Presently she demonstrates impairments impacting ability to effectively communicate or navigate cognitively in environment without significant assistance. She responded "yes" to most questions, accurately stated her name, mom's name not intelligible and pt unable to overarticulate given model/cues. She affirmed she worked, could not elaborate and mother communicated that pt is in school although difficulty to confirm with pt. As alertness improves, SLP will be able to gain knowledge of pt's comprehension, expression and cognition. Continue ST.       SLP Assessment  SLP Recommendation/Assessment: Patient needs continued Speech Lanaguage Pathology Services SLP Visit  Diagnosis: Cognitive communication deficit (R41.841)    Follow Up Recommendations  Inpatient Rehab    Frequency and Duration min 2x/week  2 weeks      SLP Evaluation Cognition  Overall Cognitive Status: Impaired/Different from baseline Orientation Level: Disoriented to place Attention: Sustained Sustained Attention: Impaired Sustained Attention Impairment: Verbal basic Memory: (TBA) Awareness: Impaired Awareness Impairment: Intellectual impairment;Emergent impairment;Anticipatory impairment Problem Solving: Impaired Problem Solving Impairment: Functional basic Safety/Judgment: Impaired       Comprehension  Auditory Comprehension Overall Auditory Comprehension: Impaired Yes/No Questions: (2/4) Commands: Impaired One Step Basic Commands: 75-100% accurate Interfering Components: Attention Visual Recognition/Discrimination Discrimination: Not tested Reading Comprehension Reading Status: (TBA)    Expression Expression Primary Mode of Expression: Verbal Verbal Expression Overall Verbal Expression: Impaired Initiation: Impaired(possibly d/t lethargy) Level of Generative/Spontaneous Verbalization: Word Repetition: (TBA) Naming: (TBA) Pragmatics: Impairment Impairments: Abnormal affect;Eye contact Written Expression Written Expression: Not tested   Oral / Motor  Oral Motor/Sensory Function Overall Oral Motor/Sensory Function: (will assess further) Motor Speech Overall Motor Speech: Appears within functional limits for tasks assessed   GO                    Andrea Hayes 07/25/2018, 3:05 PM  Andrea Hayes.Ed Nurse, children's [phone] Office [phone]

## 2018-07-25 NOTE — Evaluation (Signed)
Occupational Therapy Evaluation Patient Details Name: Andrea Hayes MRN: [id number] DOB: [birth year] Today's Date: 07/25/2018    History of Present Illness 28 y.o. female with past medical history of newly diagnosed idiopathic intracranial hypertension started on Diamox presents to the ER with severe headache, generalized weakness. Pt reports headache is continuous, dull and associated with nausea and vomiting and photophobia. 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   Clinical Impression   This 28 y/o female presents with the above. Difficult to obtain full PLOF as pt demonstrating cognitive impairments and also minimally speaks [place]. She intermittently understands/follows simple commands given multimodal cues, though often only responds yes to all questions. 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 Pt requiring at least minA (use of HHA, +2 for lines/safety), and intermittently modA for functional mobility. Increased assist required when pt fatigued and with direction changes, occasionally with staggering/scissoring gait. Pt currently requires minA for UB ADL, maxA for LB ADL. Pt requires cues to maintain eyes open during session. She will benefit from continued acute OT services and recommend follow up therapy services at CIR level to maximize her safety and independence with ADL and mobility prior to return home. Will follow.     Follow Up Recommendations  CIR;Supervision/Assistance - 24 hour    Equipment Recommendations  Other (comment)(TBD in next venue)           Precautions / Restrictions Precautions Precautions: Fall Restrictions Weight Bearing Restrictions: No      Mobility Bed Mobility Overal bed mobility: Needs  Assistance Bed Mobility: Supine to Sit;Sit to Supine     Supine to sit: Min assist Sit to supine: Min guard   General bed mobility comments: some assist to come to EOB due to lethargy  Transfers Overall transfer level: Needs assistance Equipment used: 1 person hand held assist Transfers: Sit to/from Stand Sit to Stand: Min assist         General transfer comment: steady assist    Balance Overall balance assessment: Needs assistance Sitting-balance support: Single extremity supported;Bilateral upper extremity supported Sitting balance-Leahy Scale: Poor Sitting balance - Comments: listing off to the Left in bed.  Could be due to just getting up for the first time.     Standing balance-Leahy Scale: Poor Standing balance comment: reliant on external support                           ADL either performed or assessed with clinical judgement   ADL Overall ADL's : Needs assistance/impaired Eating/Feeding: Minimal assistance;Sitting   Grooming: Minimal assistance;Set up;Sitting Grooming Details (indicate cue type and reason): at least minA for standing balance, suspect pt may require increased assist due to impaired cognition; pt would not complete during this session Upper Body Bathing: Minimal assistance;Sitting   Lower Body Bathing: Moderate assistance;Sit to/from stand;+2 for safety/equipment   Upper Body Dressing : Minimal assistance;Sitting   Lower Body Dressing: Maximal assistance;+2 for safety/equipment;Sit to/from stand Lower Body Dressing Details (indicate cue type and reason): pt with pants around her legs upon entry; mother assisting to pull up pants over hips in standing with minA for standing balance Toilet Transfer: Minimal assistance;+2 for safety/equipment;Ambulation Toilet Transfer Details (indicate cue type and reason): simulated in transfer to/from EOB Toileting- Clothing  Manipulation and Hygiene: Minimal assistance;Sit to/from stand        Functional mobility during ADLs: Minimal assistance;Moderate assistance;+2 for safety/equipment General ADL Comments: pt requires at least minA up to [facility] for functional mobility when fatigued, occasionally with staggering/scissoring gait; pt maintains eyes closed at times during mobility requiring cues to maintain open      Vision Baseline Vision/History: No visual deficits Additional Comments: pt unable to fully state; occasionally keeping eyes closed requires cues to maintain open; will continue to assess     Perception     Praxis      Pertinent Vitals/Pain Pain Assessment: Faces Faces Pain Scale: Hurts a little bit Pain Location: pt not able to specify where, generalized Pain Descriptors / Indicators: Grimacing(maintains eyes closed intermittently) Pain Intervention(s): Monitored during session     Hand Dominance     Extremity/Trunk Assessment Upper Extremity Assessment Upper Extremity Assessment: Generalized weakness;Difficult to assess due to impaired cognition   Lower Extremity Assessment Lower Extremity Assessment: Defer to PT evaluation       Communication Communication Communication: Prefers language other than English(French (unsure of dialect))   Cognition Arousal/Alertness: Lethargic;Awake/alert(intermittently keeps eyes closed) Behavior During Therapy: Flat affect Overall Cognitive Status: Impaired/Different from baseline Area of Impairment: Safety/judgement;Following commands;Orientation                 Orientation Level: Disoriented to;Place;Situation     Following Commands: Follows one step commands inconsistently;Follows one step commands with increased time Safety/Judgement: Decreased awareness of safety;Decreased awareness of deficits     General Comments: difficult to fully assess due to pt non-english speaking, though able to state simple words/phrases; pt stating she needs to see a doctor (after given cues related to orientation/location);  pt often responds yes to all questions; states she is 20 when asked    General Comments       Exercises     Shoulder Instructions      Home Living Family/patient expects to be discharged to:: Private residence Living Arrangements: Parent Available Help at Discharge: Family Type of Home: Other(Comment)(pt unable to accurately state)                           Additional Comments: pt unable at this time to relate her home situation or/ potential plans for post discharge  Lives With: Other (Comment)(unknown)    Prior Functioning/Environment          Comments: pt unable to accurately respond to questions regarding home setup, pt's mother present during session and per RN father has also been present; 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, suspect she is most likely independent PTA         OT Problem List: Decreased strength;Decreased range of motion;Decreased activity tolerance;Impaired balance (sitting and/or standing);Impaired vision/perception;Decreased cognition;Decreased safety awareness      OT Treatment/Interventions: Self-care/ADL training;Therapeutic exercise;Neuromuscular education;Energy conservation;DME and/or AE instruction;Therapeutic activities;Visual/perceptual remediation/compensation;Patient/family education;Balance training    OT Goals(Current goals can be found in the care plan section) Acute Rehab OT Goals Patient Stated Goal: pt unable to participate with goals OT Goal Formulation: Patient unable to participate in goal setting Time For Goal Achievement: 08/08/18 Potential to Achieve Goals: Good  OT Frequency: Min 2X/week   Barriers to D/C:            Co-evaluation PT/OT/SLP Co-Evaluation/Treatment: Yes Reason for Co-Treatment: For patient/therapist safety PT goals addressed during session: Mobility/safety with mobility OT goals addressed during session: Strengthening/ROM;ADL's and  self-care      AM-PAC OT "6 Clicks"  Daily Activity     Outcome Measure Help from another person eating meals?: None Help from another person taking care of personal grooming?: A Little Help from another person toileting, which includes using toliet, bedpan, or urinal?: A Lot Help from another person bathing (including washing, rinsing, drying)?: A Lot Help from another person to put on and taking off regular upper body clothing?: A Little Help from another person to put on and taking off regular lower body clothing?: A Lot 6 Click Score: 16   End of Session Equipment Utilized During Treatment: Gait belt Nurse Communication: Mobility status  Activity Tolerance: Patient tolerated treatment well;Patient limited by fatigue;Patient limited by lethargy(fatigue vs lethargy) Patient left: in bed;with call bell/phone within reach;with nursing/sitter in room;with family/visitor present  OT Visit Diagnosis: Unsteadiness on feet (R26.81);Muscle weakness (generalized) (M62.81);Other symptoms and signs involving cognitive function                Time: 1423-1440 OT Time Calculation (min): 17 min Charges:  OT General Charges $OT Visit: 1 Visit OT Evaluation $OT Eval Moderate Complexity: 1 Mod  Marcy Siren, OT [employer] Pager [phone] Office [phone]   Orlando Penner 07/25/2018, 4:25 PM

## 2018-07-25 NOTE — Progress Notes (Signed)
PIV consult: Arrived to unit, site has already been established- per Maralyn Sago, Charity fundraiser

## 2018-07-26 LAB — HOMOCYSTEINE: Homocysteine: 4.8 umol/L (ref 0.0–14.5)

## 2018-07-26 LAB — CARDIOLIPIN ANTIBODIES, IGG, IGM, IGA
Anticardiolipin IgA: <9 APL U/mL (ref 0–11)
Anticardiolipin IgG: <9 GPL U/mL (ref 0–14)
Anticardiolipin IgM: <9 MPL U/mL (ref 0–12)

## 2018-07-26 LAB — CBC
HCT: 35.3 % — ABNORMAL LOW (ref 36.0–46.0)
Hemoglobin: 11.4 g/dL — ABNORMAL LOW (ref 12.0–15.0)
MCH: 27.2 pg (ref 26.0–34.0)
MCHC: 32.3 g/dL (ref 30.0–36.0)
MCV: 84.2 fL (ref 80.0–100.0)
Platelets: 145 10*3/uL — ABNORMAL LOW (ref 150–400)
RBC: 4.19 MIL/uL (ref 3.87–5.11)
RDW: 15.9 % — ABNORMAL HIGH (ref 11.5–15.5)
WBC: 7.2 10*3/uL (ref 4.0–10.5)
nRBC: 0 % (ref 0.0–0.2)

## 2018-07-26 LAB — PROTEIN C, TOTAL: Protein C, Total: 127 % (ref 60–150)

## 2018-07-26 LAB — PROTIME-INR
INR: 1.21
Prothrombin Time: 15.2 seconds (ref 11.4–15.2)

## 2018-07-26 LAB — SICKLE CELL SCREEN: Sickle Cell Screen: NEGATIVE

## 2018-07-26 LAB — HEPARIN LEVEL (UNFRACTIONATED): Heparin Unfractionated: 0.66 IU/mL (ref 0.30–0.70)

## 2018-07-26 MED ORDER — ACETAMINOPHEN 325 MG PO TABS
650.0000 mg | ORAL_TABLET | Freq: Four times a day (QID) | ORAL | Status: DC | PRN
  Administered 2018-07-26 – 2018-07-28 (×3): 650 mg via ORAL
  Filled 2018-07-26 (×3): qty 2

## 2018-07-26 MED ORDER — WARFARIN SODIUM 5 MG PO TABS
5.0000 mg | ORAL_TABLET | Freq: Once | ORAL | Status: AC
  Administered 2018-07-26: 5 mg via ORAL
  Filled 2018-07-26 (×2): qty 1

## 2018-07-26 MED ORDER — TOPIRAMATE 25 MG PO TABS
50.0000 mg | ORAL_TABLET | Freq: Two times a day (BID) | ORAL | Status: DC
  Administered 2018-07-26 – 2018-07-29 (×6): 50 mg via ORAL
  Filled 2018-07-26 (×6): qty 2

## 2018-07-26 MED ORDER — BUTALBITAL-APAP-CAFFEINE 50-325-40 MG PO TABS
1.0000 | ORAL_TABLET | Freq: Three times a day (TID) | ORAL | Status: DC | PRN
  Administered 2018-07-26 – 2018-07-29 (×7): 1 via ORAL
  Filled 2018-07-26 (×8): qty 1

## 2018-07-26 NOTE — Progress Notes (Addendum)
STROKE TEAM PROGRESS NOTE   INTERVAL HISTORY Her mother is at the bedside.  Pt from [place], speaks [place] and [place]. She complains of a HA - no help from plan tylenol. Will add additional prn meds and add topamax. On IV heparin, start warfarin per pharmacy. No hx miscarriage, use of BCPs. Last month she went to [place], she was not sick there. Denies any other medical issues. Her father arrived at the end of rounds. Dr. Pearlean Brownie went back to discuss dx and treatment with him as well.  Vitals:   07/26/18 0400 07/26/18 0500 07/26/18 0600 07/26/18 0700  BP: 116/77 118/77 119/73 (!) 103/59  Pulse: 82 67 (!) 47 91  Resp: 15 16 17  (!) 23  Temp: 98.4 F (36.9 C)     TempSrc: Oral     SpO2: 100% 99% 100% 100%  Weight:        CBC:  Recent Labs  Lab 07/24/18 2148 07/25/18 0140 07/26/18 0550  WBC 9.1 8.7 7.2  NEUTROABS 7.7  --   --   HGB 13.0 12.6 11.4*  HCT 41.6 38.9 35.3*  MCV 84.7 82.4 84.2  PLT 163 161 145*    Basic Metabolic Panel:  Recent Labs  Lab 07/24/18 2148  NA 138  K 3.5  CL 113*  CO2 16*  GLUCOSE 107*  BUN 10  CREATININE 0.70  CALCIUM 9.6   Lipid Panel:     Component Value Date/Time   CHOL 111 07/25/2018 0140   TRIG 50 07/25/2018 0140   HDL 50 07/25/2018 0140   CHOLHDL 2.2 07/25/2018 0140   VLDL 10 07/25/2018 0140   LDLCALC 51 07/25/2018 0140   HgbA1c:  Lab Results  Component Value Date   HGBA1C 5.4 07/25/2018   Urine Drug Screen:     Component Value Date/Time   LABOPIA POSITIVE (A) 07/25/2018 1033   COCAINSCRNUR NONE DETECTED 07/25/2018 1033   LABBENZ NONE DETECTED 07/25/2018 1033   AMPHETMU NONE DETECTED 07/25/2018 1033   THCU NONE DETECTED 07/25/2018 1033   LABBARB NONE DETECTED 07/25/2018 1033    Alcohol Level No results found for: [facility]  IMAGING  Dg Chest 2 View 07/24/2018 IMPRESSION:  No active cardiopulmonary disease.    Ct Head Wo Contrast 07/24/2018 IMPRESSION:  1. Dural venous sinus thrombosis involving the  superior sagittal sinus, torcula, straight sinus, right transverse sinus, right sigmoid sinus, and severed small branching veins.  2. No acute abnormality of the brain parenchyma identified.    Ct Venogram Head 07/24/2018 IMPRESSION:  Acute dural venous sinus thrombosis from superior sagittal sinus to RIGHT sigmoid sinus and, within internal cerebral veins.      PHYSICAL EXAM  Temp:  [97.6 F (36.4 C)-98.5 F (36.9 C)] 97.7 F (36.5 C) (01/25 1942) Pulse Rate:  [47-94] 71 (01/25 1942) Resp:  [13-27] 18 (01/25 1942) BP: (85-132)/(59-88) 107/80 (01/25 1942) SpO2:  [98 %-100 %] 100 % (01/25 1942)  General - Well nourished, well developed, in no apparent distress.  Ophthalmologic - fundi not visualized due to photophobia.  Cardiovascular - Regular rate and rhythm.  Mental Status -  Level of arousal and orientation to time, place, and person were intact. Language including expression, naming, repetition, comprehension was assessed and found intact. Fund of Knowledge was assessed and was intact.  Cranial Nerves II - XII - II - Visual field intact OU. III, IV, VI - Extraocular movements intact. V - Facial sensation intact bilaterally. VII - Facial movement intact bilaterally. VIII - Hearing &  vestibular intact bilaterally. X - Palate elevates symmetrically. XI - Chin turning & shoulder shrug intact bilaterally. XII - Tongue protrusion intact.  Motor Strength - The patient's strength was normal in all extremities and pronator drift was absent.  Bulk was normal and fasciculations were absent.   Motor Tone - Muscle tone was assessed at the neck and appendages and was normal.  Reflexes - The patient's reflexes were symmetrical in all extremities and she had no pathological reflexes.  Sensory - Light touch, temperature/pinprick were assessed and were symmetrical.    Coordination - The patient had normal movements in the hands and feet with no ataxia or dysmetria.  Tremor was  absent.  Gait and Station - deferred.    ASSESSMENT/PLAN [name] is a 28 y.o. female with history of newly diagnosed idiopathic intracranial hypertension started on Diamox presenting with severe headache and generalized weakness with nausea, vomiting and photophobia.   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 d/c SCDs  No antithrombotic prior to admission, now on heparin IV and coumadin. D/c heparin IV when INR 2-3. Consider to transition to lovenox therapeutic dosing tomorrow if stable.   Continue IVF  Therapy recommendations:  CIR   Disposition: CIR  Idiopathic intracranial hypertension   On Diamox 500mg  bid  Followed by Dr. Everlena Cooper   07/09/18 LP opening pressure 47 cmH2O  07/12/18 MRV negative for CVT  Also increase topamax to 50mg  bid  Still has HA - on fioricet and tylenol PRN  Other Problems    Hospital day # 2  This patient is critically ill and at significant risk of neurological worsening, death and care requires constant monitoring of vital signs, hemodynamics,respiratory and cardiac monitoring, extensive review of multiple databases, frequent neurological assessment, discussion with family, other specialists and medical decision making of high complexity.I have made any additions or clarifications directly to the above note.This critical care time does not reflect procedure time, or teaching time or supervisory time of PA/NP/Med Resident etc but could involve care discussion time.  I spent 35 minutes of neurocritical care  time  in the care of  this patient.   Marvel Plan, MD PhD Stroke Neurology 07/26/2018 10:25 PM    To contact Stroke Continuity provider, please refer to [email or web address]. After hours, contact General Neurology

## 2018-07-26 NOTE — Progress Notes (Signed)
ANTICOAGULATION CONSULT NOTE  Pharmacy Consult for heparin / warfarin Indication: sinus thrombus  Vital Signs: Temp: 97.6 F (36.4 C) (01/25 1200) Temp Source: Oral (01/25 1200) BP: 111/83 (01/25 1300) Pulse Rate: 80 (01/25 1300)  Labs: Recent Labs    07/24/18 2148 07/24/18 2251  07/25/18 0140 07/25/18 0636 07/25/18 1330 07/26/18 0550  HGB 13.0  --   --  12.6  --   --  11.4*  HCT 41.6  --   --  38.9  --   --  35.3*  PLT 163  --   --  161  --   --  145*  APTT  --  23*  --   --   --   --   --   LABPROT  --  14.0  --   --   --   --  15.2  INR  --  1.09  --   --   --   --  1.21  HEPARINUNFRC  --   --    < > 0.13* 0.46 0.51 0.66  CREATININE 0.70  --   --   --   --   --   --    < > = values in this interval not displayed.    Assessment: CC/HPI: 28 yo f presenting with severe frontal headache  She was started on heparin for venous sinus thrombosis and now have initiated warfarin. Heparin level this morning is therapeutic. She was not taking any anticoagulation prior to admission. Current INR 1.2.   Goal of Therapy:  Heparin level 0.3-0.7 units/ml Monitor platelets by anticoagulation protocol: Yes    Plan:  -Continue heparin at 1100 units/hr -Daily HL, CBC -Warfarin 5 mg po x1 -Daily INR -Discontinue heparin when INR > 2   Baldemar Friday 07/26/2018 2:30 PM

## 2018-07-27 LAB — BASIC METABOLIC PANEL
Anion gap: 3 — ABNORMAL LOW (ref 5–15)
BUN: 8 mg/dL (ref 6–20)
CO2: 18 mmol/L — ABNORMAL LOW (ref 22–32)
Calcium: 8.4 mg/dL — ABNORMAL LOW (ref 8.9–10.3)
Chloride: 117 mmol/L — ABNORMAL HIGH (ref 98–111)
Creatinine, Ser: 0.63 mg/dL (ref 0.44–1.00)
GFR calc Af Amer: >60 mL/min (ref >60)
GFR calc non Af Amer: >60 mL/min (ref >60)
Glucose, Bld: 105 mg/dL — ABNORMAL HIGH (ref 70–99)
Potassium: 3.2 mmol/L — ABNORMAL LOW (ref 3.5–5.1)
Sodium: 138 mmol/L (ref 135–145)

## 2018-07-27 LAB — CBC
HCT: 34 % — ABNORMAL LOW (ref 36.0–46.0)
Hemoglobin: 10.9 g/dL — ABNORMAL LOW (ref 12.0–15.0)
MCH: 26.7 pg (ref 26.0–34.0)
MCHC: 32.1 g/dL (ref 30.0–36.0)
MCV: 83.1 fL (ref 80.0–100.0)
Platelets: 171 10*3/uL (ref 150–400)
RBC: 4.09 MIL/uL (ref 3.87–5.11)
RDW: 15.7 % — ABNORMAL HIGH (ref 11.5–15.5)
WBC: 8 10*3/uL (ref 4.0–10.5)
nRBC: 0 % (ref 0.0–0.2)

## 2018-07-27 LAB — MAGNESIUM: Magnesium: 1.8 mg/dL (ref 1.7–2.4)

## 2018-07-27 LAB — PROTIME-INR
INR: 1.14
Prothrombin Time: 14.5 seconds (ref 11.4–15.2)

## 2018-07-27 LAB — HEPARIN LEVEL (UNFRACTIONATED): Heparin Unfractionated: 0.34 IU/mL (ref 0.30–0.70)

## 2018-07-27 MED ORDER — POTASSIUM CHLORIDE 10 MEQ/100ML IV SOLN
10.0000 meq | INTRAVENOUS | Status: AC
  Administered 2018-07-27 (×3): 10 meq via INTRAVENOUS
  Filled 2018-07-27 (×3): qty 100

## 2018-07-27 MED ORDER — ENOXAPARIN SODIUM 60 MG/0.6ML ~~LOC~~ SOLN
60.0000 mg | Freq: Two times a day (BID) | SUBCUTANEOUS | Status: DC
  Administered 2018-07-27 – 2018-07-29 (×4): 60 mg via SUBCUTANEOUS
  Filled 2018-07-27 (×5): qty 0.6

## 2018-07-27 MED ORDER — WARFARIN SODIUM 7.5 MG PO TABS
7.5000 mg | ORAL_TABLET | Freq: Once | ORAL | Status: AC
  Administered 2018-07-27: 7.5 mg via ORAL
  Filled 2018-07-27: qty 1

## 2018-07-27 MED ORDER — POTASSIUM CHLORIDE CRYS ER 20 MEQ PO TBCR
40.0000 meq | EXTENDED_RELEASE_TABLET | Freq: Once | ORAL | Status: AC
  Administered 2018-07-27: 40 meq via ORAL
  Filled 2018-07-27: qty 2

## 2018-07-27 NOTE — Progress Notes (Signed)
STROKE TEAM PROGRESS NOTE   INTERVAL HISTORY Her mother is at the bedside. Pt stated that she took fioricet yesterday, HA much better but this morning her HA still bad. She still on heparin IV, will switch to lovenox. She seems more awake and active than yesterday. She follows with Dr. [facility]lena [facility] at [facility].   Vitals:   07/26/18 1400 07/26/18 1942 07/26/18 2330 07/27/18 0414  BP: 97/66 107/80 99/64 99/66   Pulse: 88 71 86 63  Resp: 19 18 18 18   Temp:  97.7 F (36.5 C) 98.2 F (36.8 C) (!) 97.4 F (36.3 C)  TempSrc:  Oral Oral Oral  SpO2: 98% 100% 100% 100%  Weight:        CBC:  Recent Labs  Lab 07/24/18 2148  07/26/18 0550 07/27/18 0002  WBC 9.1   < > 7.2 8.0  NEUTROABS 7.7  --   --   --   HGB 13.0   < > 11.4* 10.9*  HCT 41.6   < > 35.3* 34.0*  MCV 84.7   < > 84.2 83.1  PLT 163   < > 145* 171   < > = values in this interval not displayed.    Basic Metabolic Panel:  Recent Labs  Lab 07/24/18 2148 07/27/18 0002  NA 138 138  K 3.5 3.2*  CL 113* 117*  CO2 16* 18*  GLUCOSE 107* 105*  BUN 10 8  CREATININE 0.70 0.63  CALCIUM 9.6 8.4*  MG  --  1.8   Lipid Panel:     Component Value Date/Time   CHOL 111 07/25/2018 0140   TRIG 50 07/25/2018 0140   HDL 50 07/25/2018 0140   CHOLHDL 2.2 07/25/2018 0140   VLDL 10 07/25/2018 0140   LDLCALC 51 07/25/2018 0140   HgbA1c:  Lab Results  Component Value Date   HGBA1C 5.4 07/25/2018   Urine Drug Screen:     Component Value Date/Time   LABOPIA POSITIVE (A) 07/25/2018 1033   COCAINSCRNUR NONE DETECTED 07/25/2018 1033   LABBENZ NONE DETECTED 07/25/2018 1033   AMPHETMU NONE DETECTED 07/25/2018 1033   THCU NONE DETECTED 07/25/2018 1033   LABBARB NONE DETECTED 07/25/2018 1033    Alcohol Level No results found for: [facility]  IMAGING  Dg Chest 2 View 07/24/2018 IMPRESSION:  No active cardiopulmonary disease.    Ct Head Wo Contrast 07/24/2018 IMPRESSION:  1. Dural venous sinus thrombosis involving the superior sagittal sinus,  torcula, straight sinus, right transverse sinus, right sigmoid sinus, and severed small branching veins.  2. No acute abnormality of the brain parenchyma identified.    Ct Venogram Head 07/24/2018 IMPRESSION:  Acute dural venous sinus thrombosis from superior sagittal sinus to RIGHT sigmoid sinus and, within internal cerebral veins.      PHYSICAL EXAM  Temp:  [97.4 F (36.3 C)-98.4 F (36.9 C)] 97.4 F (36.3 C) (01/26 0414) Pulse Rate:  [51-94] 63 (01/26 0414) Resp:  [15-27] 18 (01/26 0414) BP: (97-132)/(64-88) 99/66 (01/26 0414) SpO2:  [98 %-100 %] 100 % (01/26 0414)  General - Well nourished, well developed, in no apparent distress.  Ophthalmologic - fundi not visualized due to photophobia.  Cardiovascular - Regular rate and rhythm.  Mental Status -  Level of arousal and orientation to time, place, and person were intact. Language including expression, naming, repetition, comprehension was assessed and found intact. Fund of Knowledge was assessed and was intact.  Cranial Nerves II - XII - II - Visual field intact OU. III, IV, VI - Extraocular  movements intact. V - Facial sensation intact bilaterally. VII - Facial movement intact bilaterally. VIII - Hearing & vestibular intact bilaterally. X - Palate elevates symmetrically. XI - Chin turning & shoulder shrug intact bilaterally. XII - Tongue protrusion intact.  Motor Strength - The patient's strength was normal in all extremities and pronator drift was absent.  Bulk was normal and fasciculations were absent.   Motor Tone - Muscle tone was assessed at the neck and appendages and was normal.  Reflexes - The patient's reflexes were symmetrical in all extremities and she had no pathological reflexes.  Sensory - Light touch, temperature/pinprick were assessed and were symmetrical.    Coordination - The patient had normal movements in the hands and feet with no ataxia or dysmetria.  Tremor was absent.  Gait and Station -  deferred.    ASSESSMENT/PLAN [name] is a 28 y.o. female with history of newly diagnosed idiopathic intracranial hypertension started on Diamox presenting with severe headache and generalized weakness with nausea, vomiting and photophobia.   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 d/c SCDs  No antithrombotic prior to admission, now on heparin IV and coumadin. Will transition to lovenox therapeutic dosing. INR 1.21->1.14  Continue IVF  Therapy recommendations:  CIR   Disposition: CIR  Idiopathic intracranial hypertension   On Diamox 500mg  bid  Followed by Dr. Everlena Cooper   07/09/18 LP opening pressure 47 cmH2O  07/12/18 MRV negative for CVT  Continue topamax to 50mg  bid  Still has HA - continue fioricet and tylenol PRN  Other Problems  Hypokalemia - supplement  Hospital day # 3  Marvel Plan, MD PhD Stroke Neurology 07/27/2018 1:34 PM     To contact Stroke Continuity provider, please refer to [email or web address]. After hours, contact General Neurology

## 2018-07-27 NOTE — Progress Notes (Addendum)
ANTICOAGULATION CONSULT NOTE  Pharmacy Consult for heparin / warfarin>>enoxaparin/warfarin Indication: sinus thrombus  Vital Signs: Temp: 98 F (36.7 C) (01/26 0803) Temp Source: Oral (01/26 0803) BP: 96/68 (01/26 0803) Pulse Rate: 72 (01/26 0803)  Labs: Recent Labs    07/24/18 2148 07/24/18 2251 07/25/18 0140  07/25/18 1330 07/26/18 0550 07/27/18 0002  HGB 13.0  --  12.6  --   --  11.4* 10.9*  HCT 41.6  --  38.9  --   --  35.3* 34.0*  PLT 163  --  161  --   --  145* 171  APTT  --  23*  --   --   --   --   --   LABPROT  --  14.0  --   --   --  15.2 14.5  INR  --  1.09  --   --   --  1.21 1.14  HEPARINUNFRC  --   --  0.13*   < > 0.51 0.66 0.34  CREATININE 0.70  --   --   --   --   --  0.63   < > = values in this interval not displayed.    Assessment: CC/HPI: 28 yo f presenting with severe frontal headache  She was started on heparin for venous sinus thrombosis and now have initiated warfarin. Heparin level this morning is therapeutic. She was not taking any anticoagulation prior to admission.   Heparin level is on lower end of therapeutic range. INR down to 1.14   Goal of Therapy:  INR 2-3 Heparin level 0.3-0.7 units/ml Monitor platelets by anticoagulation protocol: Yes    Plan:  -Increase heparin to 1150 units/hr - Heparin level in 6 hours -Daily HL, CBC -Warfarin 7.5 mg po x1 -Daily INR -Discontinue heparin when INR > 2   Ryman Rathgeber A. Jeanella Craze, PharmD, BCPS Clinical Pharmacist [place] Please utilize Amion for appropriate phone number to reach the unit pharmacist [facility])   Addendum:  MD wants to transition to an enoxaparin bridge to warfarin. Patient weighs  57.5 kg. CrCl ~90 ml/min. Will round to 60mg  SQ q12h. Will d/c heparin at 1700 and give first dose of enoxaparin at 1800. Continue with warfarin 7.5mg  tonight.   Harmonii Karle A. Jeanella Craze, PharmD, BCPS Clinical Pharmacist [place] Please utilize Amion for appropriate phone number to reach the unit  pharmacist [facility])    07/27/2018 10:17 AM

## 2018-07-27 NOTE — Plan of Care (Signed)
  Problem: Education: Goal: Knowledge of patient specific risk factors addressed and post discharge goals established will improve Outcome: Progressing   Problem: Coping: Goal: Will verbalize positive feelings about self Outcome: Progressing   Problem: Coping: Goal: Will identify appropriate support needs Outcome: Progressing

## 2018-07-27 NOTE — Progress Notes (Signed)
Pt continue to c/o fh/a pain medication  administered with  snack as pt did not want pain medication in an empty tommy. Will continue to monitor.  P.t K +  level was 3.2 neurololoy on call text paged,  No new orders at this time.

## 2018-07-28 DIAGNOSIS — G08 Intracranial and intraspinal phlebitis and thrombophlebitis: Principal | ICD-10-CM

## 2018-07-28 DIAGNOSIS — D62 Acute posthemorrhagic anemia: Secondary | ICD-10-CM | POA: Diagnosis not present

## 2018-07-28 DIAGNOSIS — I63511 Cerebral infarction due to unspecified occlusion or stenosis of right middle cerebral artery: Secondary | ICD-10-CM

## 2018-07-28 DIAGNOSIS — G932 Benign intracranial hypertension: Secondary | ICD-10-CM

## 2018-07-28 LAB — BASIC METABOLIC PANEL
Anion gap: 8 (ref 5–15)
BUN: 6 mg/dL (ref 6–20)
CO2: 16 mmol/L — ABNORMAL LOW (ref 22–32)
Calcium: 8.8 mg/dL — ABNORMAL LOW (ref 8.9–10.3)
Chloride: 113 mmol/L — ABNORMAL HIGH (ref 98–111)
Creatinine, Ser: 0.7 mg/dL (ref 0.44–1.00)
GFR calc Af Amer: >60 mL/min (ref >60)
GFR calc non Af Amer: >60 mL/min (ref >60)
Glucose, Bld: 97 mg/dL (ref 70–99)
Potassium: 3.6 mmol/L (ref 3.5–5.1)
Sodium: 137 mmol/L (ref 135–145)

## 2018-07-28 LAB — CBC
HCT: 34.8 % — ABNORMAL LOW (ref 36.0–46.0)
Hemoglobin: 11.6 g/dL — ABNORMAL LOW (ref 12.0–15.0)
MCH: 27.6 pg (ref 26.0–34.0)
MCHC: 33.3 g/dL (ref 30.0–36.0)
MCV: 82.9 fL (ref 80.0–100.0)
Platelets: 193 10*3/uL (ref 150–400)
RBC: 4.2 MIL/uL (ref 3.87–5.11)
RDW: 15.7 % — ABNORMAL HIGH (ref 11.5–15.5)
WBC: 6.7 10*3/uL (ref 4.0–10.5)
nRBC: 0 % (ref 0.0–0.2)

## 2018-07-28 LAB — PROTEIN S ACTIVITY: Protein S Activity: 22 % — ABNORMAL LOW (ref 63–140)

## 2018-07-28 LAB — BETA-2-GLYCOPROTEIN I ABS, IGG/M/A
Beta-2 Glyco I IgG: <9 GPI IgG units (ref 0–20)
Beta-2-Glycoprotein I IgA: <9 GPI IgA units (ref 0–25)
Beta-2-Glycoprotein I IgM: <9 GPI IgM units (ref 0–32)

## 2018-07-28 LAB — LUPUS ANTICOAGULANT PANEL
DRVVT: 44.7 s (ref 0.0–47.0)
PTT Lupus Anticoagulant: 25.3 s (ref 0.0–51.9)

## 2018-07-28 LAB — PROTIME-INR
INR: 1.74
Prothrombin Time: 20.2 seconds — ABNORMAL HIGH (ref 11.4–15.2)

## 2018-07-28 LAB — PROTEIN C ACTIVITY: Protein C Activity: 180 % (ref 73–180)

## 2018-07-28 LAB — PROTEIN S, TOTAL: Protein S Ag, Total: 109 % (ref 60–150)

## 2018-07-28 MED ORDER — WARFARIN SODIUM 1 MG PO TABS
1.0000 mg | ORAL_TABLET | Freq: Once | ORAL | Status: AC
  Administered 2018-07-28: 1 mg via ORAL
  Filled 2018-07-28: qty 1

## 2018-07-28 NOTE — Progress Notes (Addendum)
Occupational Therapy Treatment Patient Details Name: Andrea Hayes MRN: [id number] DOB: Oct 17, 1990 Today's Date: 07/28/2018    History of present illness 28 y.o. female with past medical history of newly diagnosed idiopathic intracranial hypertension started on Diamox presents to the ER with severe headache, generalized weakness. Pt reports headache is continuous, dull and associated with nausea and vomiting and photophobia. 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   OT comments  Patient supine in bed and eager to participate.    Able to complete basic ADL activities with min guard to close supervision, functional mobility approx 75 feet with min guard for safety but noted impaired dynamic balance with head turns. Patient reports "wanting to walk more" and requested to exit room, and upon returning reports significantly worse headache and automatically returning to bed/closing eyes.  Plan for next session to focus on sensory re-integration and compensatory techniques to introduce light into her room in order to better engage in ADLs; as well as higher level executive functioning tasks. DC plan updated to OP OT, as patient progressing well.    Follow Up Recommendations  Outpatient OT;Supervision/Assistance - 24 hour    Equipment Recommendations  3 in 1 bedside commode    Recommendations for Other Services      Precautions / Restrictions Precautions Precautions: Fall Restrictions Weight Bearing Restrictions: No       Mobility Bed Mobility Overal bed mobility: Needs Assistance Bed Mobility: Supine to Sit;Sit to Supine     Supine to sit: Supervision Sit to supine: Supervision   General bed mobility comments: supervision for safety, no assist required  Transfers Overall transfer level: Needs assistance Equipment used: None Transfers:  Sit to/from Stand Sit to Stand: Min guard;Supervision         General transfer comment: min guard to close supervision for transfers     Balance Overall balance assessment: Needs assistance Sitting-balance support: No upper extremity supported;Feet supported Sitting balance-Leahy Scale: Good     Standing balance support: No upper extremity supported;During functional activity Standing balance-Leahy Scale: Fair Standing balance comment: min guard to close supervision for safety                           ADL either performed or assessed with clinical judgement   ADL Overall ADL's : Needs assistance/impaired     Grooming: Supervision/safety;Standing;Oral care;Wash/dry hands Grooming Details (indicate cue type and reason): sequencing oral care with supervision, no assist required                 Toilet Transfer: Ambulation;Min guard Toilet Transfer Details (indicate cue type and reason): simulated in room         Functional mobility during ADLs: Min guard General ADL Comments: min guard for safety during mobility, limited by headache and impaired balance      Vision   Additional Comments: visual scanning to locate items on wall with supervision, denies visual changes; continue to assess   Perception     Praxis      Cognition Arousal/Alertness: Awake/alert Behavior During Therapy: Flat affect Overall Cognitive Status: Impaired/Different from baseline Area of Impairment: Safety/judgement;Awareness;Problem solving                         Safety/Judgement: Decreased awareness of safety Awareness: Emergent Problem Solving: Requires verbal cues General  Comments: Patient oriented and alert today.  Difficulty recalling/explaining "situation" but reports a really bad headache that worsens with mobility and light.  Basic ADL problem solving with superivison, some emergent awareness. Further assessment for higher level cognition required.          Exercises     Shoulder Instructions       General Comments patient requested to complete increased functional mobility today, mobility in hall with min guard for safety no AD, with noted slight losses of balance with head turns and greatly increased headache once returning to room     Pertinent Vitals/ Pain       Pain Assessment: Faces Faces Pain Scale: Hurts whole lot(initially 4, but increases with activity out of room) Pain Intervention(s): Monitored during session;Repositioned;Other (comment)(RN notified )  Home Living                                          Prior Functioning/Environment              Frequency  Min 2X/week        Progress Toward Goals  OT Goals(current goals can now be found in the care plan section)  Progress towards OT goals: Progressing toward goals;OT to reassess next treatment  Acute Rehab OT Goals Patient Stated Goal: to get home  OT Goal Formulation: With patient Time For Goal Achievement: 08/08/18 Potential to Achieve Goals: Good  Plan Discharge plan needs to be updated;Frequency remains appropriate    Co-evaluation                 AM-PAC OT "6 Clicks" Daily Activity     Outcome Measure   Help from another person eating meals?: None Help from another person taking care of personal grooming?: A Little Help from another person toileting, which includes using toliet, bedpan, or urinal?: A Little Help from another person bathing (including washing, rinsing, drying)?: A Little Help from another person to put on and taking off regular upper body clothing?: A Little Help from another person to put on and taking off regular lower body clothing?: A Little 6 Click Score: 19    End of Session Equipment Utilized During Treatment: Gait belt  OT Visit Diagnosis: Unsteadiness on feet (R26.81);Muscle weakness (generalized) (M62.81);Other symptoms and signs involving cognitive function   Activity Tolerance Patient  tolerated treatment well;Patient limited by pain   Patient Left in bed;with call bell/phone within reach;with family/visitor present   Nurse Communication Mobility status        Time: 1505-6979 OT Time Calculation (min): 19 min  Charges: OT General Charges $OT Visit: 1 Visit OT Treatments $Self Care/Home Management : 8-22 mins  Chancy Milroy, OT Acute Rehabilitation Services Pager [phone] Office [phone]    Chancy Milroy 07/28/2018, 4:43 PM

## 2018-07-28 NOTE — Progress Notes (Signed)
ANTICOAGULATION CONSULT NOTE  Pharmacy Consult for enoxaparin / warfarin Indication: sinus thrombus  Vital Signs: Temp: 97.7 F (36.5 C) (01/27 0338) Temp Source: Oral (01/27 0338) BP: 97/74 (01/27 0338) Pulse Rate: 50 (01/27 0338)  Labs: Recent Labs    07/25/18 1330  07/26/18 0550 07/27/18 0002 07/28/18 0555  HGB  --    < > 11.4* 10.9* 11.6*  HCT  --   --  35.3* 34.0* 34.8*  PLT  --   --  145* 171 193  LABPROT  --   --  15.2 14.5 20.2*  INR  --   --  1.21 1.14 1.74  HEPARINUNFRC 0.51  --  0.66 0.34  --   CREATININE  --   --   --  0.63 0.70   < > = values in this interval not displayed.    Assessment: CC/HPI: 28 yo f presenting with severe frontal headache. No anticoag PTA. Venous sinus thrombosis on CT head. Using Lovenox to bridge to Coumadin. INR this morning jumped to 1.74 after 3 doses of Coumadin. Hgb 11.6, plts wnl.  Goal of Therapy:  INR 2-3 Heparin level 0.3-0.7 units/ml Monitor platelets by anticoagulation protocol: Yes   Plan:  Continue enoxaparin 60mg  [place] Q12h Give Coumadin 1mg  PO x 1 tonight Monitor daily INR, CBC, s/s of bleed  Enzo Bi, PharmD, BCPS, [facility] Clinical Pharmacist Phone number [phone] 07/28/2018 7:41 AM

## 2018-07-28 NOTE — Progress Notes (Addendum)
STROKE TEAM PROGRESS NOTE   INTERVAL HISTORY Mom at bedside.  Patient sitting up in bed, awake, talkative.  Looks much better than when I saw her last on Friday.  States she still has headache but it is much improved.  States when she walks she zigzags and is unsteady.  Therapy has not seen her yet today or over the weekend.  Rehab consult is pending. She is bright, awake and alert. Translating for her mother. She has little memory of last week d/t severe HA.  Vitals:   07/27/18 2258 07/28/18 0005 07/28/18 0338 07/28/18 0758  BP:  95/61 97/74 102/72  Pulse:  69 (!) 50 61  Resp:  18 18 18   Temp:  98.4 F (36.9 C) 97.7 F (36.5 C) 98.1 F (36.7 C)  TempSrc:  Oral Oral Oral  SpO2:  100% 100% 100%  Weight: 57.5 kg       CBC:  Recent Labs  Lab 07/24/18 2148  07/27/18 0002 07/28/18 0555  WBC 9.1   < > 8.0 6.7  NEUTROABS 7.7  --   --   --   HGB 13.0   < > 10.9* 11.6*  HCT 41.6   < > 34.0* 34.8*  MCV 84.7   < > 83.1 82.9  PLT 163   < > 171 193   < > = values in this interval not displayed.    Basic Metabolic Panel:  Recent Labs  Lab 07/27/18 0002 07/28/18 0555  NA 138 137  K 3.2* 3.6  CL 117* 113*  CO2 18* 16*  GLUCOSE 105* 97  BUN 8 6  CREATININE 0.63 0.70  CALCIUM 8.4* 8.8*  MG 1.8  --    Lipid Panel:     Component Value Date/Time   CHOL 111 07/25/2018 0140   TRIG 50 07/25/2018 0140   HDL 50 07/25/2018 0140   CHOLHDL 2.2 07/25/2018 0140   VLDL 10 07/25/2018 0140   LDLCALC 51 07/25/2018 0140   HgbA1c:  Lab Results  Component Value Date   HGBA1C 5.4 07/25/2018   Urine Drug Screen:     Component Value Date/Time   LABOPIA POSITIVE (A) 07/25/2018 1033   COCAINSCRNUR NONE DETECTED 07/25/2018 1033   LABBENZ NONE DETECTED 07/25/2018 1033   AMPHETMU NONE DETECTED 07/25/2018 1033   THCU NONE DETECTED 07/25/2018 1033   LABBARB NONE DETECTED 07/25/2018 1033    Alcohol Level No results found for: ETH  IMAGING Ct Head Wo Contrast 07/24/2018 IMPRESSION:  1.  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  2. No acute abnormality of the brain parenchyma identified.   Ct Venogram Head 07/24/2018 IMPRESSION:  Acute dural venous sinus thrombosis from superior sagittal sinus to RIGHT sigmoid sinus and, within internal cerebral veins.   Dg Chest 2 View 07/24/2018 IMPRESSION:  No active cardiopulmonary disease.   Imaging past 24h No results found. .   PHYSICAL EXAM General - Well nourished, well developed, in no apparent distress. Cardiovascular - Regular rate and rhythm.  Mental Status -  Level of arousal and orientation to time, place, and person were intact. Language including expression, naming, repetition, comprehension was assessed and found intact. Fund of Knowledge was assessed and was intact.  Cranial Nerves II - XII - II - Visual field intact OU. III, IV, VI - Extraocular movements intact. V - Facial sensation intact bilaterally. VII - Facial movement intact bilaterally. VIII - Hearing &  vestibular intact bilaterally. X - Palate elevates symmetrically. XI - Chin turning & shoulder shrug intact bilaterally. XII - Tongue protrusion intact.  Motor Strength - The patient's strength was normal in all extremities and pronator drift was absent.  Bulk was normal and fasciculations were absent.   Motor Tone - Muscle tone was assessed at the neck and appendages and was normal.  Reflexes - The patient's reflexes were symmetrical in all extremities and she had no pathological reflexes.  Sensory - Light touch, temperature/pinprick were assessed and were symmetrical.    Coordination - The patient had normal movements in the hands and feet with no ataxia or dysmetria.  Tremor was absent.  Gait and Station - deferred.   ASSESSMENT/PLAN [name] is a 28 y.o. female with history of newly diagnosed  idiopathic intracranial hypertension started on Diamox presenting with severe headache and generalized weakness with nausea, vomiting and photophobia.   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 d/c SCDs  No antithrombotic prior to admission, now on heparin IV and coumadin. Will transition to lovenox therapeutic dosing. INR 1.21->1.74. pharmacy managing transition.   Continue IVF  Therapy recommendations:  CIR   Disposition: pending  - pt states she weaves when she walks - CIR to see today - therapy to reassess - ok for d/c from medical standpoint once bed available on CIR or cleared from rehab need  Follow up Dr. Everlena Cooper at d/c  Idiopathic intracranial hypertension   On Diamox 500mg  bid  Followed by Dr. Everlena Cooper   07/09/18 LP opening pressure 47 cmH2O  07/12/18 MRV negative for CVT  Continue topamax to 50mg  bid  Still has HA - continue fioricet and tylenol PRN  Other Problems  Hypokalemia - supplement - 3.6 - resolved  Hospital day # 4  Annie Main, MSN, APRN, ANVP-BC, AGPCNP-BC Advanced Practice Stroke Nurse [facility] See Amion for Schedule & Pager information 07/28/2018 4:08 PM   ATTENDING NOTE: I reviewed above note and agree with the assessment and plan. Pt was seen and examined.   No acute event overnight.  No neuro changes, denies vision changes.  Still has severe headache, Fioricet helps but not lasting long.  Heparin IV switched to Lovenox therapeutic dosing.  INR this morning 1.74.  CIR consulted, recommend home health  PT/OT.  Continue Diamox and Topamax as well as Fioricet as needed for headache control.  Plan to discharge tomorrow.  Marvel Plan, MD PhD Stroke Neurology 07/28/2018 6:54 PM    To contact Stroke Continuity provider, please refer to [email or web address]. After hours, contact General Neurology

## 2018-07-28 NOTE — Consult Note (Signed)
Physical Medicine and Rehabilitation Consult Reason for Consult:  Decreased functional mobility with headache and generalized weakness Referring Physician: Dr.Xu   HPI: Charlsey Jeni Sallesiang is a 28 y.o.right handed female with past medical history of newly diagnosed idiopathic intracranial hypertension per Dr. Everlena CooperJaffe and recently started on Diamox. Per chart review and patient, patient lives with her parents. Independent prior to admission. Family can assist as needed. Presented to the ED 07/24/2018 with severe headache and generalized weakness. Noted bouts of nausea vomiting and photophobia. 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 No acute abnormality of the brain parenchyma identified. Recently underwent MR venogram 07/12/2018 which was negative for any venous thrombosis. 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 Initially placed on intravenous heparin transition to Lovenox/Coumadin. Hypercoagulable workup pending. Maintained on Topamax for headaches. Tolerating a regular diet. Therapy evaluations completed with recommendations of physical medicine rehabilitation consult.  Review of Systems  Constitutional: Negative for chills and fever.  HENT: Negative for hearing loss.   Eyes: Positive for photophobia. Negative for blurred vision and double vision.  Respiratory: Negative for cough and shortness of breath.   Cardiovascular: Negative for chest pain, palpitations and leg swelling.  Gastrointestinal: Positive for nausea and vomiting.  Genitourinary: Negative for dysuria, flank pain and hematuria.  Skin: Negative for rash.  Neurological: Positive for dizziness, focal weakness and headaches. Negative for seizures and weakness.  All other systems reviewed and  are negative.  Past medical history: intracranial hypertension No past surgical history. No pertinent family history of intracranial hypertention Social History:  reports that she has never smoked. She has never used smokeless tobacco. She reports that she does not drink alcohol or use drugs. Allergies: No Known Allergies Medications Prior to Admission  Medication Sig Dispense Refill  . acetaZOLAMIDE (DIAMOX) 250 MG tablet Take 2 tablets (500 mg total) by mouth 2 (two) times daily. (Patient not taking: Reported on 07/24/2018) 120 tablet 3    Home: Home Living Family/patient expects to be discharged to:: Private residence Living Arrangements: Parent Available Help at Discharge: Family Type of Home: Other(Comment)(pt unable to accurately state) Additional Comments: pt unable at this time to relate her home situation or/ potential plans for post discharge  Lives With: Other (Comment)(unknown)  Functional History: Prior Function Comments: pt unable to accurately respond to questions regarding home setup, pt's mother present during session and per RN father has also been present; 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, suspect she is most likely independent PTA  Functional Status:  Mobility: Bed Mobility Overal bed mobility: Needs Assistance Bed Mobility: Supine to Sit, Sit to Supine Supine to sit: Min assist Sit to supine: Min guard General bed mobility comments: some assist to come to EOB due to lethargy Transfers Overall transfer level: Needs assistance Equipment used: 1 person hand held assist Transfers: Sit to/from Stand Sit to Stand: Min assist General transfer comment: steady assist Ambulation/Gait Ambulation/Gait assistance: Min assist(to light mod assist with fatigue ) Gait Distance (Feet): 200 Feet Assistive device: 1 person hand held assist Gait Pattern/deviations: Step-through pattern General Gait Details: mildly unsteady overall, with drifting and  scissoring.  Pt not scanning or interact unless spoken to. Gait velocity: moderate to slower Gait velocity interpretation: <1.8 ft/sec, indicate of risk for recurrent falls  ADL: ADL Overall ADL's : Needs assistance/impaired Eating/Feeding: Minimal assistance, Sitting Grooming: Minimal assistance, Set up, Sitting Grooming Details (indicate cue type and reason): at least minA for standing balance, suspect pt may require increased assist due to impaired cognition; pt would not complete during this session Upper Body Bathing: Minimal assistance, Sitting Lower Body Bathing: Moderate assistance, Sit to/from stand, +2 for safety/equipment Upper Body Dressing : Minimal assistance, Sitting Lower Body Dressing: Maximal assistance, +2 for safety/equipment, Sit to/from stand Lower Body Dressing Details (indicate cue type and reason): pt with pants around her legs upon entry; mother assisting to pull up pants over hips in standing with minA for standing balance Toilet Transfer: Minimal assistance, +2 for safety/equipment, Ambulation Toilet Transfer Details (indicate cue type and reason): simulated in transfer to/from EOB Toileting- Clothing Manipulation and Hygiene: Minimal assistance, Sit to/from stand Functional mobility during ADLs: Minimal assistance, Moderate assistance, +2 for safety/equipment General ADL Comments: pt requires at least minA up to [facility] for functional mobility when fatigued, occasionally with staggering/scissoring gait; pt maintains eyes closed at times during mobility requiring cues to maintain open   Cognition: Cognition Overall Cognitive Status: Impaired/Different from baseline Orientation Level: Oriented X4 Attention: Sustained Sustained Attention: Impaired Sustained Attention Impairment: Verbal basic Memory: (TBA) Awareness: Impaired Awareness Impairment: Intellectual impairment, Emergent impairment, Anticipatory impairment Problem Solving: Impaired Problem Solving  Impairment: Functional basic Safety/Judgment: Impaired Cognition Arousal/Alertness: Lethargic, Awake/alert(intermittently keeps eyes closed) Behavior During Therapy: Flat affect Overall Cognitive Status: Impaired/Different from baseline Area of Impairment: Safety/judgement, Following commands, Orientation Orientation Level: Disoriented to, Place, Situation Following Commands: Follows one step commands inconsistently, Follows one step commands with increased time Safety/Judgement: Decreased awareness of safety, Decreased awareness of deficits General Comments: difficult to fully assess due to pt non-english speaking, though able to state simple words/phrases; pt stating she needs to see a doctor (after given cues related to orientation/location); pt often responds yes to all questions; states she is 20 when asked   Blood pressure 97/74, pulse (!) 50, temperature 97.7 F (36.5 C), temperature source Oral, resp. rate 18, weight 57.5 kg, last menstrual period 07/20/2018, SpO2 100 %. Physical Exam  Vitals reviewed. Constitutional: She appears well-developed and well-nourished.  HENT:  Head: Normocephalic and atraumatic.  Eyes: EOM are normal. Right eye exhibits no discharge. Left eye exhibits no discharge.  Neck: Normal range of motion. Neck supple. No thyromegaly present.  Cardiovascular: Normal rate and regular rhythm.  Respiratory: Effort normal and breath sounds normal. No respiratory distress.  GI: Soft. Bowel sounds are normal. She exhibits no distension.  Musculoskeletal:     Comments: No edema or tenderness in extremities  Neurological: She is alert.  Follows commands Motor: RUE: 5/5 proximal to distal RLE: 5/5 proximal to distal LUE: 4+/5 proximal to distal LLE: 4-4+/5 proxima to distal  Skin: Skin is warm and dry.  Psychiatric: She has a normal mood and affect. Her behavior is normal.    No results found for this or any previous visit (from the past 24 hour(s)). No results  found.  Assessment/Plan: Diagnosis: Intracranial thrombosis Labs independently reviewed.  Records reviewed and summated above.  1. Does the need for close, 24 hr/day medical supervision in concert with the patient's rehab needs make it unreasonable for this patient to be served in a less intensive setting? No  2. Co-Morbidities requiring supervision/potential complications: newly diagnosed idiopathic intracranial hypertension (cont to monitor, cont meds), ABLA (repeat labs, transfuse to ensure appropriate perfusion for increased activity tolerance) 3. Due to safety, skin/wound care,  disease management, pain management and patient education, does the patient require 24 hr/day rehab nursing? Potentially 4. Does the patient require coordinated care of a physician, rehab nurse, PT (1-2 hrs/day, 5 days/week) and OT (1-2 hrs/day, 5 days/week) to address physical and functional deficits in the context of the above medical diagnosis(es)? No Addressing deficits in the following areas: balance, endurance, locomotion, strength, transferring, bathing, dressing, toileting and psychosocial support 5. Can the patient actively participate in an intensive therapy program of at least 3 hrs of therapy per day at least 5 days per week? Yes 6. The potential for patient to make measurable gains while on inpatient rehab is good 7. Anticipated functional outcomes upon discharge from inpatient rehab are modified independent  with PT, modified independent with OT, n/a with SLP. 8. Estimated rehab length of stay to reach the above functional goals is: 3-5 days. 9. Anticipated D/C setting: Home 10. Anticipated post D/C treatments: HH therapy and Home excercise program 11. Overall Rehab/Functional Prognosis: excellent and good  RECOMMENDATIONS: This patient's condition is appropriate for continued rehabilitative care in the following setting: Patient progressing well, seen ambulating with IV pole from bed to restroom and  toileting without assistance.  [facility]. Further, patient does not believe she requires CIR at present. Patient has agreed to participate in recommended program. No Note that insurance prior authorization may be required for reimbursement for recommended care.  Comment: Rehab Admissions Coordinator to follow up.   I have personally performed a face to face diagnostic evaluation, including, but not limited to relevant history and physical exam findings, of this patient and developed relevant assessment and plan.  Additionally, I have reviewed and concur with the physician assistant's documentation above.   Maryla MorrowAnkit Katriana Dortch, MD, ABPMR Mcarthur Rossettianiel J Angiulli, PA-C 07/28/2018

## 2018-07-28 NOTE — Progress Notes (Addendum)
  Speech Language Pathology Treatment: Cognitive-Linquistic  Patient Details Name: Andrea Hayes MRN: [id number] DOB: [birth year] Today's Date: 07/28/2018 Time: 8453-6468 SLP Time Calculation (min) (ACUTE ONLY): 33 min  Assessment / Plan / Recommendation Clinical Impression  Pt has made significant improvements since evaluation Friday. She reports "waking up" sometime Friday for the first time since admit. She is alert, oriented to person, place, time with some confusion re: her diagnosis (language would be a barrier for more abstract/medical info- recommend MD review diagnosis, future treatment/expectations using the interpreter pad to fully comprehend). She wrote a basic sentence re: her siblings but had trouble expounding on different topics). Required max verbal/visual cues for simple math calculation and hypothetical situation/problem solving re: time. Speech is intelligible from neurologic standpoint (sometimes challenging due to accent). 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   Pt needs further cognitive therapy re: executive functioning to return to independence at work and college. (home health)     HPI HPI: Andrea Hayes is an 28 y.o. female with past medical history of newly diagnosed idiopathic intracranial hypertension started on Diamox presents to the ER with severe headache and generalized weakness associated with nausea and vomiting and photophobia. 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

## 2018-07-28 NOTE — Progress Notes (Signed)
Physical Therapy Treatment Patient Details Name: Andrea Hayes MRN: [id number] DOB: January 28, 1991 Today's Date: 07/28/2018    History of Present Illness 28 y.o. female with past medical history of newly diagnosed idiopathic intracranial hypertension started on Diamox presents to the ER with severe headache, generalized weakness. Pt reports headache is continuous, dull and associated with nausea and vomiting and photophobia. 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    PT Comments    Pt was seen after OT had visited, and pt was experiencing a HA related apparently to the exposure of hallway light and activity.  Her plan with PT and OT conferring is to add sunglasses and even a hat if needed to avoid stimulation that will increase her vascular HA.  Pt is in bed at end of session and asked nursing to please help with her pain.  Follow acutely for progression of mobility after 1) pain managed and 2) pt has sufficient comfort of protection from the factors that are still irritating her pain.  Will work on more standing balance challenges after her symptoms are better managed.  Follow Up Recommendations  CIR;Supervision/Assistance - 24 hour     Equipment Recommendations  Other (comment)(determine in CIR)    Recommendations for Other Services Rehab consult     Precautions / Restrictions Precautions Precautions: Fall Restrictions Weight Bearing Restrictions: No    Mobility  Bed Mobility Overal bed mobility: Needs Assistance Bed Mobility: Supine to Sit;Sit to Supine     Supine to sit: Supervision Sit to supine: Supervision   General bed mobility comments: for safety  Transfers Overall transfer level: Needs assistance Equipment used: None Transfers: Sit to/from Stand Sit to Stand: Supervision         General transfer comment: for  safety  Ambulation/Gait Ambulation/Gait assistance: Min guard(for safety) Gait Distance (Feet): 200 Feet Assistive device: 1 person hand held assist Gait Pattern/deviations: Step-through pattern;Wide base of support;Trunk flexed;Decreased stride length Gait velocity: reduced Gait velocity interpretation: <1.31 ft/sec, indicative of household ambulator General Gait Details: guarded pattern but after walk pt then told PT her HA is a Environmental consultant    Modified Rankin (Stroke Patients Only)       Balance Overall balance assessment: Needs assistance Sitting-balance support: No upper extremity supported;Feet supported Sitting balance-Leahy Scale: Good     Standing balance support: Single extremity supported Standing balance-Leahy Scale: Fair Standing balance comment: min guard, not significant                            Cognition Arousal/Alertness: Awake/alert Behavior During Therapy: Flat affect Overall Cognitive Status: Impaired/Different from baseline Area of Impairment: Awareness;Problem solving                       Following Commands: Follows one step commands with increased time Safety/Judgement: Decreased awareness of safety Awareness: Emergent Problem Solving: Slow processing;Requires verbal cues General Comments: pt was reminded she could ask to hold PT or ask for meds      Exercises      General Comments General comments (skin integrity, edema, etc.): walked with slow pace and pt could control her balance, but would not be able to add turns of head      Pertinent  Vitals/Pain Pain Assessment: 0-10 Pain Score: 10-Worst pain ever Faces Pain Scale: Hurts whole lot(initially 4, but increases with activity out of room) Pain Location: HA Pain Descriptors / Indicators: Grimacing Pain Intervention(s): Monitored during session;Repositioned;Other (comment);Patient requesting pain meds-RN notified(contacted RN  to ask for meds)    Home Living                      Prior Function            PT Goals (current goals can now be found in the care plan section) Acute Rehab PT Goals Patient Stated Goal: to get home  Progress towards PT goals: Progressing toward goals    Frequency    Min 3X/week      PT Plan Current plan remains appropriate    Co-evaluation              AM-PAC PT "6 Clicks" Mobility   Outcome Measure  Help needed turning from your back to your side while in a flat bed without using bedrails?: None Help needed moving from lying on your back to sitting on the side of a flat bed without using bedrails?: A Little Help needed moving to and from a bed to a chair (including a wheelchair)?: A Little Help needed standing up from a chair using your arms (e.g., wheelchair or bedside chair)?: A Little Help needed to walk in hospital room?: A Little Help needed climbing 3-5 steps with a railing? : A Lot 6 Click Score: 18    End of Session Equipment Utilized During Treatment: Gait belt Activity Tolerance: Patient limited by pain Patient left: in bed;with call bell/phone within reach;with family/visitor present Nurse Communication: Mobility status PT Visit Diagnosis: Unsteadiness on feet (R26.81);Muscle weakness (generalized) (M62.81);Pain Pain - Right/Left: (head) Pain - part of body: (head)     Time: [id number] PT Time Calculation (min) (ACUTE ONLY): 13 min  Charges:  $Gait Training: 8-22 mins                    Ivar DrapeRuth E Kramer Hanrahan 07/28/2018, 7:19 PM  Samul Dadauth Octavie Westerhold, PT MS Acute Rehab Dept. Number: [facility] [phone] and [facility] [phone]

## 2018-07-29 LAB — CBC
HCT: 37.6 % (ref 36.0–46.0)
Hemoglobin: 12.2 g/dL (ref 12.0–15.0)
MCH: 26.8 pg (ref 26.0–34.0)
MCHC: 32.4 g/dL (ref 30.0–36.0)
MCV: 82.5 fL (ref 80.0–100.0)
Platelets: 193 10*3/uL (ref 150–400)
RBC: 4.56 MIL/uL (ref 3.87–5.11)
RDW: 15.8 % — ABNORMAL HIGH (ref 11.5–15.5)
WBC: 5.6 10*3/uL (ref 4.0–10.5)
nRBC: 0 % (ref 0.0–0.2)

## 2018-07-29 LAB — BASIC METABOLIC PANEL
Anion gap: 9 (ref 5–15)
BUN: 9 mg/dL (ref 6–20)
CO2: 16 mmol/L — ABNORMAL LOW (ref 22–32)
Calcium: 8.8 mg/dL — ABNORMAL LOW (ref 8.9–10.3)
Chloride: 113 mmol/L — ABNORMAL HIGH (ref 98–111)
Creatinine, Ser: 0.67 mg/dL (ref 0.44–1.00)
GFR calc Af Amer: >60 mL/min (ref >60)
GFR calc non Af Amer: >60 mL/min (ref >60)
Glucose, Bld: 96 mg/dL (ref 70–99)
Potassium: 3.5 mmol/L (ref 3.5–5.1)
Sodium: 138 mmol/L (ref 135–145)

## 2018-07-29 LAB — PROTIME-INR
INR: 1.51
Prothrombin Time: 18 seconds — ABNORMAL HIGH (ref 11.4–15.2)

## 2018-07-29 MED ORDER — ACETAMINOPHEN 325 MG PO TABS: 650.0000 mg | ORAL_TABLET | Freq: Four times a day (QID) | ORAL | Status: DC | PRN

## 2018-07-29 MED ORDER — TOPIRAMATE 50 MG PO TABS: 50.0000 mg | ORAL_TABLET | Freq: Two times a day (BID) | ORAL | 2 refills | Status: DC

## 2018-07-29 MED ORDER — BUTALBITAL-APAP-CAFFEINE 50-325-40 MG PO TABS: 1.0000 | ORAL_TABLET | Freq: Three times a day (TID) | ORAL | 0 refills | Status: DC | PRN

## 2018-07-29 MED ORDER — WARFARIN SODIUM 5 MG PO TABS: 5.0000 mg | ORAL_TABLET | Freq: Once | ORAL | Status: DC

## 2018-07-29 MED ORDER — WARFARIN SODIUM 5 MG PO TABS: 5.0000 mg | ORAL_TABLET | Freq: Once | ORAL | 2 refills | Status: DC

## 2018-07-29 MED ORDER — ENOXAPARIN SODIUM 60 MG/0.6ML ~~LOC~~ SOLN: 60.0000 mg | Freq: Two times a day (BID) | SUBCUTANEOUS | 0 refills | Status: DC

## 2018-07-29 NOTE — Discharge Instructions (Signed)
Due to hemorrhage and risk of bleeding, do not take aspirin, aspirin-containing medications, or ibuprofen products (for example, Ibuprofen, Anacin, Alka-Seltzer, Bufferin, Ecotrin, Dristan, Sine-Off, Midol, Nuprin, Aleve, Thera-Flu, Advil). You may take Tylenol (acetaminophen). Do not take over 3000 mg of tylenol in a 24h period.   You need to get your blood checked in 2 days by your medical doctor to monitor and manage warfarin. Once warfarin is therapeutic, we will stop the lovenox (shots). Only anticipate you will need 2-3 days. I tried to get you an appt at [facility] but they were closed for lunch. They should call me back. Please call them to arrange an appt on Thursday.   Avoid pregnancy while on coumadin. Follow up with OB/GYN for birth control concerns. Barrier method recommended (avoid birth control pills and other types of estrogen)

## 2018-07-29 NOTE — Progress Notes (Signed)
ANTICOAGULATION CONSULT NOTE  Pharmacy Consult for enoxaparin / warfarin Indication: sinus thrombus  Vital Signs: Temp: 98.2 F (36.8 C) (01/28 0512) Temp Source: Oral (01/28 0512) BP: 100/74 (01/28 0512) Pulse Rate: 73 (01/28 0512)  Labs: Recent Labs    07/27/18 0002 07/28/18 0555 07/29/18 0501  HGB 10.9* 11.6* 12.2  HCT 34.0* 34.8* 37.6  PLT 171 193 193  LABPROT 14.5 20.2* 18.0*  INR 1.14 1.74 1.51  HEPARINUNFRC 0.34  --   --   CREATININE 0.63 0.70 0.67    Assessment: CC/HPI: 28 yo f presenting with severe frontal headache. No anticoag PTA. Venous sinus thrombosis on CT head. Using Lovenox to bridge to Coumadin.   INR subtherapeutic at 1.51, CBC wnl and no bleeding reported.  Goal of Therapy:  INR 2-3 Heparin level 0.3-0.7 units/ml Monitor platelets by anticoagulation protocol: Yes   Plan:  Continue enoxaparin 60mg  [place] q12h Warfarin 5mg  PO x 1 tonight Daily INR, CBC, s/s bleeding  Daylene Posey, PharmD Clinical Pharmacist Please check AMION for all [facility] numbers 07/29/2018 8:07 AM

## 2018-07-29 NOTE — Progress Notes (Signed)
Irregular hr noted, labeled afib by tele, however p-waves are seen. MD notified.

## 2018-07-29 NOTE — Progress Notes (Signed)
Physical Therapy Treatment Patient Details Name: Andrea Hayes MRN: [id number] DOB: 03/03/1991 Today's Date: 07/29/2018    History of Present Illness Pt is a 28 y.o. female admitted 07/24/18 with severe headache, vomiting and photophobia. 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   PT Comments    Pt progressing with mobility. Continues to demonstrate unsteady gait, but pt not interested in DME use for added stability. Able to ambulate in hallway while wearing sunglasses to reduce photophobia; sunglasses provided to aide with transition to outdoor sunlight since pt discharging home this afternoon. Will have necessary assist from home. Recommend follow-up with outpatient neuro PT to address higher level balance deficits.   Follow Up Recommendations  Outpatient PT (neuro);Supervision for mobility/OOB     Equipment Recommendations  None recommended by PT    Recommendations for Other Services       Precautions / Restrictions Precautions Precautions: Fall;Other (comment) Precaution Comments: Photophobia Restrictions Weight Bearing Restrictions: No    Mobility  Bed Mobility Overal bed mobility: Modified Independent                Transfers Overall transfer level: Needs assistance Equipment used: None Transfers: Sit to/from Stand Sit to Stand: Supervision         General transfer comment: for safety  Ambulation/Gait Ambulation/Gait assistance: Supervision;Min guard Gait Distance (Feet): 200 Feet Assistive device: None;Straight cane Gait Pattern/deviations: Step-through pattern;Decreased stride length;Scissoring;Narrow base of support Gait velocity: Decreased Gait velocity interpretation: <1.8 ft/sec, indicate of risk for recurrent falls General Gait Details: Slow, intermittently unsteady gait without DME and intermittent min guard for balance. Attempted gait training with SPC, but pt did not enjoy using. Pt  able to self-correct instability   Stairs             Wheelchair Mobility    Modified Rankin (Stroke Patients Only)       Balance Overall balance assessment: Needs assistance Sitting-balance support: No upper extremity supported;Feet supported Sitting balance-Leahy Scale: Good     Standing balance support: No upper extremity supported;During functional activity Standing balance-Leahy Scale: Fair Standing balance comment: min guard, patient reliant on UE support (1)              High level balance activites: Backward walking;Side stepping;Direction changes;Sudden stops;Turns;Head turns High Level Balance Comments: 1x self-corrected LOB when turning quickly to look behind her, reliant on UE support on furniture to prevent fall            Cognition Arousal/Alertness: Awake/alert Behavior During Therapy: WFL for tasks assessed/performed Overall Cognitive Status: Impaired/Different from baseline Area of Impairment: Awareness;Safety/judgement                         Safety/Judgement: Decreased awareness of deficits Awareness: Emergent Problem Solving: Requires verbal cues General Comments: patient with improving awareness to deficits and sensory needs, continues to require cueing to problem solve through needs when she leaves controlled hospital room enviorment       Exercises      General Comments General comments (skin integrity, edema, etc.): pt reports more comfortable with UE support during mobility, educated on use of 3:1 as chair in bathroom       Pertinent Vitals/Pain Pain Assessment: Faces Pain Score: 1  Faces Pain Scale: Hurts little more Pain Location: Headache with increased light brightness Pain Descriptors / Indicators: Grimacing;Guarding Pain Intervention(s): Monitored during session;Limited activity within patient's tolerance;Other (comment)(wore sunglasses)    Home Living  Prior Function             PT Goals (current goals can now be found in the care plan section) Acute Rehab PT Goals Patient Stated Goal: to get home  PT Goal Formulation: With patient Time For Goal Achievement: 08/08/18 Potential to Achieve Goals: Good Progress towards PT goals: Progressing toward goals    Frequency    Min 3X/week      PT Plan Discharge plan needs to be updated;Frequency needs to be updated    Co-evaluation              AM-PAC PT "6 Clicks" Mobility   Outcome Measure  Help needed turning from your back to your side while in a flat bed without using bedrails?: None Help needed moving from lying on your back to sitting on the side of a flat bed without using bedrails?: None Help needed moving to and from a bed to a chair (including a wheelchair)?: A Little Help needed standing up from a chair using your arms (e.g., wheelchair or bedside chair)?: A Little Help needed to walk in hospital room?: A Little Help needed climbing 3-5 steps with a railing? : A Little 6 Click Score: 20    End of Session Equipment Utilized During Treatment: Gait belt Activity Tolerance: Patient tolerated treatment well;Patient limited by pain Patient left: in bed;with call bell/phone within reach;with family/visitor present Nurse Communication: Mobility status PT Visit Diagnosis: Unsteadiness on feet (R26.81);Muscle weakness (generalized) (M62.81);Pain     Time: 2440-1027 PT Time Calculation (min) (ACUTE ONLY): 20 min  Charges:  $Gait Training: 8-22 mins                    Ina Homes, PT, DPT Acute Rehabilitation Services  Pager [phone] Office [phone]  Malachy Chamber 07/29/2018, 3:08 PM

## 2018-07-29 NOTE — Progress Notes (Signed)
IP rehab admissions - Please see rehab consult recommending HH for therapies.  Please also note that OT has changed recommendations to outpatient therapies.  Patient will not need a CIR stay since she is doing very well.  Call me for questions.  [phone]

## 2018-07-29 NOTE — Progress Notes (Addendum)
Occupational Therapy Treatment Patient Details Name: Andrea Hayes MRN: [id number] DOB: 04/12/1991 Today's Date: 07/29/2018    History of present illness 28 y.o. female with past medical history of newly diagnosed idiopathic intracranial hypertension started on Diamox presents to the ER with severe headache, generalized weakness. Pt reports headache is continuous, dull and associated with nausea and vomiting and photophobia. 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   OT comments  Patient pleasant and cooperative.  Supine in bed with 'reading light' on upon therapist entering room.  Session focused on education of sensory input and understanding of effects to assist in engaging in environment in order to optimize independence and decrease headache.  Therapist turned off reading light and used natural light from blinds, pt only able to comfortably tolerate 1/2 of the blinds open during session.  Positional changes affecting headache as well, quick transitions from supine to sitting increase to 10/10 headache, but able to transition from supine to sidelying with headache decreasing to 5/10 then to sitting increased to 9/10.  Educated slowly reintegrating positional changes, light and sounds, adapting environment, and compensating when needed, as well as continued re-assessment of her needs during each activity.   Plan to return and see how patient is incorporating recommendations into day.  Recommend OP OT. Will continue to follow.     Follow Up Recommendations  Outpatient OT;Supervision/Assistance - 24 hour    Equipment Recommendations  3 in 1 bedside commode    Recommendations for Other Services      Precautions / Restrictions Precautions Precautions: Fall Restrictions Weight Bearing Restrictions: No       Mobility Bed Mobility Overal bed  mobility: Needs Assistance Bed Mobility: Rolling;Sidelying to Sit;Supine to Sit;Sit to Supine Rolling: Supervision Sidelying to sit: Supervision Supine to sit: Supervision Sit to supine: Supervision      Transfers                      Balance                                           ADL either performed or assessed with clinical judgement   ADL Overall ADL's : Needs assistance/impaired                                       General ADL Comments: session focused on sensory integration and desensatization to prepare for increased participation in ADLs without intense headache.  Reviewed sensory input (light, sound, smells), enviormental affect on ADLs, and positional changes.       Vision       Perception     Praxis      Cognition Arousal/Alertness: Awake/alert Behavior During Therapy: Flat affect Overall Cognitive Status: Impaired/Different from baseline Area of Impairment: Awareness;Problem solving                           Awareness: Emergent Problem Solving: Requires verbal cues          Exercises     Shoulder Instructions       General Comments      Pertinent Vitals/ Pain  Pain Assessment: 0-10 Pain Score: 8 (flucutates from 5-10 throughout session ) Pain Location: HA Pain Descriptors / Indicators: Grimacing Pain Intervention(s): Monitored during session;Other (comment);Utilized relaxation techniques(RN notified, utilized decreased sensory inputs )  Home Living                                          Prior Functioning/Environment              Frequency  Min 2X/week        Progress Toward Goals  OT Goals(current goals can now be found in the care plan section)  Progress towards OT goals: Progressing toward goals  Acute Rehab OT Goals Patient Stated Goal: to get home  OT Goal Formulation: With patient Time For Goal Achievement: 08/08/18 Potential to  Achieve Goals: Good ADL Goals Additional ADL Goal #3: Patient will demonstrate self awareness and activity modification to control sensory input with supervision.  Plan Discharge plan remains appropriate;Frequency remains appropriate    Co-evaluation                 AM-PAC OT "6 Clicks" Daily Activity     Outcome Measure   Help from another person eating meals?: None Help from another person taking care of personal grooming?: A Little Help from another person toileting, which includes using toliet, bedpan, or urinal?: A Little Help from another person bathing (including washing, rinsing, drying)?: A Little Help from another person to put on and taking off regular upper body clothing?: A Little Help from another person to put on and taking off regular lower body clothing?: A Little 6 Click Score: 19    End of Session    OT Visit Diagnosis: Unsteadiness on feet (R26.81);Muscle weakness (generalized) (M62.81);Other symptoms and signs involving cognitive function   Activity Tolerance Patient tolerated treatment well;Patient limited by pain   Patient Left in bed;with call bell/phone within reach;with family/visitor present   Nurse Communication Mobility status        Time: 0731-0802 OT Time Calculation (min): 31 min  Charges: OT General Charges $OT Visit: 1 Visit OT Treatments $Self Care/Home Management : 8-22 mins $Therapeutic Activity: 8-22 mins  Chancy Milroy, OT Acute Rehabilitation Services Pager [phone] Office [phone]    Chancy Milroy 07/29/2018, 8:30 AM

## 2018-07-29 NOTE — Progress Notes (Signed)
Occupational Therapy Treatment Patient Details Name: Andrea Hayes MRN: [id number] DOB: 04/10/1991 Today's Date: 07/29/2018    History of present illness 28 y.o. female with past medical history of newly diagnosed idiopathic intracranial hypertension started on Diamox presents to the ER with severe headache, generalized weakness. Pt reports headache is continuous, dull and associated with nausea and vomiting and photophobia. 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   OT comments  Patient in much better spirits this afternoon.  Reports headache at 1/10, reports it taking about an hour of environmental changes to improve headache (probably combination of medication as well). Patient declined continued gradual opening of blinds at this time.  Continued education on compensatory techniques and adaptations to environment upon dc for home and community reintegration (ex: wearing sunglasses/hat, slow progression of light into room in the morning, reclining seat back in car if needed, allowing more time to adjust to activities, etc), as well as self 'checks' to determine if she needs to adjust her input or take breaks, patient verbalizes understanding. Educated on continued recommendation of outpatient OT services, pt agreeable.  Will continue to follow.     Follow Up Recommendations  Outpatient OT;Supervision/Assistance - 24 hour    Equipment Recommendations  3 in 1 bedside commode    Recommendations for Other Services      Precautions / Restrictions Precautions Precautions: Fall Restrictions Weight Bearing Restrictions: No       Mobility Bed Mobility Overal bed mobility: Modified Independent                Transfers Overall transfer level: Needs assistance Equipment used: None Transfers: Sit to/from Stand Sit to Stand: Supervision          General transfer comment: for safety    Balance Overall balance assessment: Needs assistance   Sitting balance-Leahy Scale: Good     Standing balance support: No upper extremity supported;During functional activity Standing balance-Leahy Scale: Fair Standing balance comment: min guard, patient reliant on UE support (1)                            ADL either performed or assessed with clinical judgement   ADL Overall ADL's : Needs assistance/impaired     Grooming: Supervision/safety;Standing                   Toilet Transfer: Ambulation;Min P[employer] scheme managerguard Toilet Transfer Details (indicate cue type and reason): simulated in room     Tub/ Shower Transfer: Tub transfer;Min guard;Ambulation;3 in 1 Tub/Shower Transfer Details (indicate cue type and reason): simulated in room  Functional mobility during ADLs: Min guard General ADL Comments: continued sensory integration education, patient with no increased pain throughout session reporting it took about an hour for pain to decrease with natural light in room.  pt declined when therapist requested to allow more natural light into room today.  reviewed enviomental adapations, positional changes and plans for home/community re-integration     Vision       Perception     Praxis      Cognition Arousal/Alertness: Awake/alert Behavior During Therapy: WFL for tasks assessed/performed Overall Cognitive Status: Impaired/Different from baseline Area of Impairment: Awareness;Problem solving                           Awareness:  Emergent Problem Solving: Requires verbal cues General Comments: patient with improving awareness to deficits and sensory needs, continues to require cueing to problem solve through needs when she leaves controlled hospital room enviorment         Exercises     Shoulder Instructions       General Comments pt reports more comfortable with UE support during mobility, educated on  use of 3:1 as chair in bathroom     Pertinent Vitals/ Pain       Pain Assessment: 0-10 Pain Score: 1  Pain Intervention(s): Monitored during session  Home Living                                          Prior Functioning/Environment              Frequency  Min 2X/week        Progress Toward Goals  OT Goals(current goals can now be found in the care plan section)  Progress towards OT goals: Progressing toward goals  Acute Rehab OT Goals Patient Stated Goal: to get home  OT Goal Formulation: With patient Time For Goal Achievement: 08/08/18 Potential to Achieve Goals: Good  Plan Discharge plan remains appropriate;Frequency remains appropriate    Co-evaluation                 AM-PAC OT "6 Clicks" Daily Activity     Outcome Measure   Help from another person eating meals?: None Help from another person taking care of personal grooming?: None Help from another person toileting, which includes using toliet, bedpan, or urinal?: A Little Help from another person bathing (including washing, rinsing, drying)?: A Little Help from another person to put on and taking off regular upper body clothing?: None Help from another person to put on and taking off regular lower body clothing?: A Little 6 Click Score: 21    End of Session    OT Visit Diagnosis: Unsteadiness on feet (R26.81);Muscle weakness (generalized) (M62.81);Other symptoms and signs involving cognitive function   Activity Tolerance Patient tolerated treatment well;Patient limited by pain   Patient Left in bed;with call bell/phone within reach;with family/visitor present   Nurse Communication Mobility status        Time: 1202-1215 OT Time Calculation (min): 13 min  Charges: OT General Charges $OT Visit: 1 Visit OT Treatments $Self Care/Home Management : 8-22 mins  Chancy Milroy, OT Acute Rehabilitation Services Pager [phone] Office [phone]    Chancy Milroy 07/29/2018, 1:25 PM

## 2018-07-29 NOTE — Discharge Summary (Addendum)
Stroke Discharge Summary  Patient ID: Andrea Hayes    l   MRN: [id number]      DOB: 04/19/1991  Date of Admission: 07/24/2018 Date of Discharge: 07/29/2018  Attending Physician:  Marvel PlanXu, Stephanie Mcglone, MD, Stroke MD Consultant(s):     Maryla MorrowAnkit Patel, MD (Physical Medicine & Rehabtilitation)  Patient's PCP:  Patient, No Pcp Per  DISCHARGE DIAGNOSIS:  Principal Problem:   Dural venous sinus thrombosis Active Problems:   Acute blood loss anemia   Intracranial hypertension   History reviewed. No pertinent past medical history. History reviewed. No pertinent surgical history.  Allergies as of 07/29/2018   No Known Allergies     Medication List    TAKE these medications   acetaminophen 325 MG tablet Commonly known as:  TYLENOL Take 2 tablets (650 mg total) by mouth every 6 (six) hours as needed for mild pain or headache (do not take over 3000 mg in a day of tylenol. please note there is tylenol in the fiorecet).   acetaZOLAMIDE 250 MG tablet Commonly known as:  DIAMOX Take 2 tablets (500 mg total) by mouth 2 (two) times daily.   butalbital-acetaminophen-caffeine 50-325-40 MG tablet Commonly known as:  FIORICET, ESGIC Take 1 tablet by mouth every 8 (eight) hours as needed for headache.   enoxaparin 60 MG/0.6ML injection Commonly known as:  LOVENOX Inject 0.6 mLs (60 mg total) into the skin every 12 (twelve) hours.   topiramate 50 MG tablet Commonly known as:  TOPAMAX Take 1 tablet (50 mg total) by mouth 2 (two) times daily.   warfarin 5 MG tablet Commonly known as:  COUMADIN Take 1 tablet (5 mg total) by mouth one time only at 6 PM.            Durable Medical Equipment  (From admission, onward)         Start     Ordered   07/29/18 1146  For home use only DME 3 n 1  Once     07/29/18 1145          LABORATORY STUDIES CBC    Component Value Date/Time   WBC 5.6 07/29/2018 0501   RBC 4.56 07/29/2018 0501   HGB 12.2 07/29/2018 0501   HCT 37.6 07/29/2018 0501   PLT  193 07/29/2018 0501   MCV 82.5 07/29/2018 0501   MCH 26.8 07/29/2018 0501   MCHC 32.4 07/29/2018 0501   RDW 15.8 (H) 07/29/2018 0501   LYMPHSABS 0.9 07/24/2018 2148   MONOABS 0.4 07/24/2018 2148   EOSABS 0.0 07/24/2018 2148   BASOSABS 0.0 07/24/2018 2148   CMP    Component Value Date/Time   NA 138 07/29/2018 0501   K 3.5 07/29/2018 0501   CL 113 (H) 07/29/2018 0501   CO2 16 (L) 07/29/2018 0501   GLUCOSE 96 07/29/2018 0501   BUN 9 07/29/2018 0501   CREATININE 0.67 07/29/2018 0501   CALCIUM 8.8 (L) 07/29/2018 0501   PROT 7.5 07/13/2017 1011   ALBUMIN 3.7 07/13/2017 1011   AST 24 07/13/2017 1011   ALT 23 07/13/2017 1011   ALKPHOS 51 07/13/2017 1011   BILITOT 1.2 07/13/2017 1011   GFRNONAA >60 07/29/2018 0501   GFRAA >60 07/29/2018 0501   COAGS Lab Results  Component Value Date   INR 1.51 07/29/2018   INR 1.74 07/28/2018   INR 1.14 07/27/2018   Lipid Panel    Component Value Date/Time   CHOL 111 07/25/2018 0140   TRIG 50 07/25/2018 0140   HDL 50  07/25/2018 0140   CHOLHDL 2.2 07/25/2018 0140   VLDL 10 07/25/2018 0140   LDLCALC 51 07/25/2018 0140   HgbA1C  Lab Results  Component Value Date   HGBA1C 5.4 07/25/2018   Urinalysis    Component Value Date/Time   COLORURINE STRAW (A) 07/13/2017 1425   APPEARANCEUR CLEAR 07/13/2017 1425   LABSPEC 1.035 (H) 07/13/2017 1425   PHURINE 6.0 07/13/2017 1425   GLUCOSEU NEGATIVE 07/13/2017 1425   HGBUR NEGATIVE 07/13/2017 1425   BILIRUBINUR NEGATIVE 07/13/2017 1425   KETONESUR NEGATIVE 07/13/2017 1425   PROTEINUR NEGATIVE 07/13/2017 1425   UROBILINOGEN 0.2 10/23/2012 1135   NITRITE NEGATIVE 07/13/2017 1425   LEUKOCYTESUR NEGATIVE 07/13/2017 1425   Urine Drug Screen     Component Value Date/Time   LABOPIA POSITIVE (A) 07/25/2018 1033   COCAINSCRNUR NONE DETECTED 07/25/2018 1033   LABBENZ NONE DETECTED 07/25/2018 1033   AMPHETMU NONE DETECTED 07/25/2018 1033   THCU NONE DETECTED 07/25/2018 1033   LABBARB NONE  DETECTED 07/25/2018 1033    Alcohol Level No results found for: [facility]   SIGNIFICANT DIAGNOSTIC STUDIES Ct Head Wo Contrast 07/24/2018 IMPRESSION:  1. 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  2. No acute abnormality of the brain parenchyma identified.   Ct Venogram Head 07/24/2018 IMPRESSION:  Acute dural venous sinus thrombosis from superior sagittal sinus to RIGHT sigmoid sinus and, within internal cerebral veins.   Dg Chest 2 View 07/24/2018 IMPRESSION:  No active cardiopulmonary disease.      HISTORY OF PRESENT ILLNESS Andrea Hayes is an 28 y.o. female with past medical history of newly diagnosed idiopathic intracranial hypertension started on Diamox presents to the ER with severe headache that began this morning as well as generalized weakness.  Headache is continuous, dull and associated with nausea and vomiting and photophobia. She underwent a CT head shows dural venous sinus thrombosis involving the superior sagittal sinus, straight sinus transverse sinus on the right side as well as sigmoid sinus and smaller branching veins. Recent MR venogram on 07/12/2018 was negative for venous thrombosis.  She has been having having intermittent headaches and evaluation by outpatient neurologist showed papilledema.  She saw Dr. Everlena CooperJaffe who diagnosed her with idiopathic intracranial hypertension and started her on Diamox after opening pressure was 47 on LP performed on 1/8 /2020. She has known PMH of Idiopathic intracranial hypertension. She was admitted to the neuro ICU for further evaluation and treatment.    HOSPITAL COURSE Andrea Hayes is a 28 y.o. female with history of newly diagnosed idiopathic intracranial hypertension started on Diamox presenting with severe headache and generalized weakness with nausea, vomiting and photophobia.   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 INR 1.21->1.74->1.51. in hospital, pharmacy managing transition. Can continue as an OP by PCP.  Therapy recommendations:  CIR -> progressed to OP therapy, equipment  Disposition: home  Idiopathic intracranial hypertension   On  Diamox 500mg  bid  Followed by Dr. Everlena Cooper   07/09/18 LP opening pressure 47 cmH2O  07/12/18 MRV negative for CVT  Continue topamax to 50mg  bid  Still has HA - continue fioricet and tylenol PRN  Other Problems  Hypokalemia - supplement - 3.6 - resolved   DISCHARGE EXAM Blood pressure 99/67, pulse 86, temperature 98 F (36.7 C), temperature source Oral, resp. rate 20, weight 57.5 kg, last menstrual period 07/20/2018, SpO2 100 %. General - Well nourished, well developed, in no apparent distress. Cardiovascular - Regular rate and rhythm.  Mental Status -  Level of arousal and orientation to time, place, and person were intact. Language including expression, naming, repetition, comprehension was assessed and found intact. Fund of Knowledge was assessed and was intact.  Cranial Nerves II - XII - II - Visual field intact OU. III, IV, VI - Extraocular movements intact. V - Facial sensation intact bilaterally. VII - Facial movement intact bilaterally. VIII - Hearing & vestibular intact bilaterally. X - Palate elevates symmetrically. XI - Chin turning & shoulder shrug intact  bilaterally. XII - Tongue protrusion intact.  Motor Strength - The patient's strength was normal in all extremities and pronator drift was absent.  Bulk was normal and fasciculations were absent.   Motor Tone - Muscle tone was assessed at the neck and appendages and was normal.  Reflexes - The patient's reflexes were symmetrical in all extremities and she had no pathological reflexes.  Sensory - Light touch, temperature/pinprick were assessed and were symmetrical.    Coordination - The patient had normal movements in the hands and feet with no ataxia or dysmetria.  Tremor was absent.  Gait and Station - slightly unsteady  Discharge Diet   Regular diet, thin liquids  DISCHARGE PLAN  Disposition:  Home  Outpatient PT and OT  lovenox bridge to warfarin. Anticipate 2-3 more days of lovenox needed. Goal INR 2-3  Advised to avoid pregnancy using barrier methods. Not ok for OCP.  Follow-up [facility] in 2 days for INR check, adjust warfarin. Have called office for appt. Instructed pt to call for appt if not in place prior to d/c. She understands and is agreeable  Follow-up Dr. Everlena Cooper on 08/13/18.   45 minutes were spent preparing discharge.  Annie Main, MSN, APRN, ANVP-BC, AGPCNP-BC Advanced Practice Stroke Nurse [facility] See Amion for Schedule & Pager information 07/29/2018 1:36 PM    ATTENDING NOTE: I reviewed above note and agree with the assessment and plan. Pt was seen and examined.   Pt no acute event overnight. Neuro intact. Still complains of HA but some better than yesterday. INR 1.51. pt still on lovenox for bridge. She worked with PT/OT and recommend HH vs. outpt therapy. Pt will be trained how to inject lovenox at home. She needs to see her PCP in Thursday or Friday to check INR. Once INR at goal 2-3, lovenox can be discontinued. Continue diamox and topamax now. Topamax may be discontinued later once pt improved to avoid weight loss side effect.  She will also follow up with Dr. Everlena Cooper on 08/13/18 as scheduled.   Marvel Plan, MD PhD Stroke Neurology 07/29/2018 3:12 PM

## 2018-07-29 NOTE — Care Management Note (Addendum)
Case Management Note  Patient Details  Name: Andrea Hayes MRN: [id number] Date of Birth: 07/03/1990  Subjective/Objective:   Pt admitted with cerebral venous thrombosis. She is from home with her family. DME; none PCP: Dr Jeanie Cooks at [facility] No issues obtaining meds. No issues with transportation.                 Action/Plan: CM consulted for outpatient therapy. CM met with the patient and she would like to attend [facility] Neuro rehab. Orders in Epic and information on the AVS.  Pt with orders for 3 in 1. James with [facility] DME notified and will deliver to the room. Family able to provide transport home.    Addendum: checked the co pay for home lovenox. Came back at $10.   Expected Discharge Date:                  Expected Discharge Plan:  OP Rehab  In-House Referral:     Discharge planning Services  CM Consult  Post Acute Care Choice:  Durable Medical Equipment Choice offered to:  Patient  DME Arranged:  3-N-1 DME Agency:  [place]:    [place]:     Status of Service:  Completed, signed off  If discussed at [place] of Stay Meetings, dates discussed:    Additional Comments:  Pollie Friar, RN 07/29/2018, 11:48 AM

## 2018-07-29 NOTE — Progress Notes (Signed)
Pt and family given discharge instructions and paperwork, able to verbalize understanding and teach back. No new questions or concerns. IV and telemetry d/c.  Pt asked RN to call MD before d/c. When RN returned to room, pt and family not in room. D/c summary and handouts given.

## 2018-07-31 LAB — FACTOR 5 LEIDEN

## 2018-08-01 LAB — PROTHROMBIN GENE MUTATION

## 2018-08-04 ENCOUNTER — Emergency Department (HOSPITAL_COMMUNITY)
Admission: EM | Admit: 2018-08-04 | Discharge: 2018-08-05 | Disposition: A | Discharge status: Home / Self Care | Payer: BLUE CROSS/BLUE SHIELD | Attending: Emergency Medicine | Admitting: Emergency Medicine

## 2018-08-04 ENCOUNTER — Encounter (HOSPITAL_COMMUNITY): Payer: Self-pay | Admitting: *Deleted

## 2018-08-04 DIAGNOSIS — R51 Headache: Secondary | ICD-10-CM | POA: Diagnosis not present

## 2018-08-04 DIAGNOSIS — Z79899 Other long term (current) drug therapy: Secondary | ICD-10-CM | POA: Diagnosis not present

## 2018-08-04 DIAGNOSIS — G08 Intracranial and intraspinal phlebitis and thrombophlebitis: Secondary | ICD-10-CM

## 2018-08-04 DIAGNOSIS — R519 Headache, unspecified: Secondary | ICD-10-CM

## 2018-08-04 MED ORDER — DIPHENHYDRAMINE HCL 50 MG/ML IJ SOLN
25.0000 mg | Freq: Once | INTRAMUSCULAR | Status: AC
  Administered 2018-08-04: 25 mg via INTRAVENOUS
  Filled 2018-08-04: qty 1

## 2018-08-04 MED ORDER — METOCLOPRAMIDE HCL 5 MG/ML IJ SOLN
10.0000 mg | Freq: Once | INTRAMUSCULAR | Status: AC
  Administered 2018-08-05: 10 mg via INTRAVENOUS
  Filled 2018-08-04: qty 2

## 2018-08-04 MED ORDER — SODIUM CHLORIDE 0.9 % IV BOLUS (SEPSIS)
1000.0000 mL | Freq: Once | INTRAVENOUS | Status: AC
  Administered 2018-08-05: 1000 mL via INTRAVENOUS

## 2018-08-04 NOTE — ED Triage Notes (Signed)
Pt in c/o headache for the last week, recently admitted for the same

## 2018-08-05 ENCOUNTER — Emergency Department (HOSPITAL_COMMUNITY): Payer: BLUE CROSS/BLUE SHIELD

## 2018-08-05 ENCOUNTER — Encounter (HOSPITAL_COMMUNITY): Payer: Self-pay | Admitting: Radiology

## 2018-08-05 LAB — I-STAT BETA HCG BLOOD, ED (MC, WL, AP ONLY): I-stat hCG, quantitative: <5 m[IU]/mL (ref <5)

## 2018-08-05 LAB — CBC WITH DIFFERENTIAL/PLATELET
Abs Immature Granulocytes: 0.03 10*3/uL (ref 0.00–0.07)
Basophils Absolute: 0.1 10*3/uL (ref 0.0–0.1)
Basophils Relative: 1 %
Eosinophils Absolute: 0.3 10*3/uL (ref 0.0–0.5)
Eosinophils Relative: 4 %
HCT: 43.2 % (ref 36.0–46.0)
Hemoglobin: 13.1 g/dL (ref 12.0–15.0)
Immature Granulocytes: 0 %
Lymphocytes Relative: 28 %
Lymphs Abs: 1.9 10*3/uL (ref 0.7–4.0)
MCH: 26.5 pg (ref 26.0–34.0)
MCHC: 30.3 g/dL (ref 30.0–36.0)
MCV: 87.4 fL (ref 80.0–100.0)
Monocytes Absolute: 0.7 10*3/uL (ref 0.1–1.0)
Monocytes Relative: 10 %
Neutro Abs: 3.9 10*3/uL (ref 1.7–7.7)
Neutrophils Relative %: 57 %
Platelets: 259 10*3/uL (ref 150–400)
RBC: 4.94 MIL/uL (ref 3.87–5.11)
RDW: 15.3 % (ref 11.5–15.5)
WBC: 6.9 10*3/uL (ref 4.0–10.5)
nRBC: 0 % (ref 0.0–0.2)

## 2018-08-05 LAB — BASIC METABOLIC PANEL
Anion gap: 11 (ref 5–15)
BUN: 8 mg/dL (ref 6–20)
CO2: 21 mmol/L — ABNORMAL LOW (ref 22–32)
Calcium: 9.7 mg/dL (ref 8.9–10.3)
Chloride: 105 mmol/L (ref 98–111)
Creatinine, Ser: 0.77 mg/dL (ref 0.44–1.00)
GFR calc Af Amer: >60 mL/min (ref >60)
GFR calc non Af Amer: >60 mL/min (ref >60)
Glucose, Bld: 101 mg/dL — ABNORMAL HIGH (ref 70–99)
Potassium: 3.8 mmol/L (ref 3.5–5.1)
Sodium: 137 mmol/L (ref 135–145)

## 2018-08-05 LAB — PROTIME-INR
INR: 2.43
Prothrombin Time: 26.1 seconds — ABNORMAL HIGH (ref 11.4–15.2)

## 2018-08-05 MED ORDER — METOCLOPRAMIDE HCL 10 MG PO TABS: 10.0000 mg | ORAL_TABLET | Freq: Three times a day (TID) | ORAL | 0 refills | Status: DC | PRN

## 2018-08-05 MED ORDER — MORPHINE SULFATE (PF) 4 MG/ML IV SOLN: 4.0000 mg | Freq: Once | INTRAVENOUS | Status: DC

## 2018-08-05 MED ORDER — IOHEXOL 300 MG/ML  SOLN
75.0000 mL | Freq: Once | INTRAMUSCULAR | Status: AC | PRN
  Administered 2018-08-05: 75 mL via INTRAVENOUS

## 2018-08-05 MED ORDER — ONDANSETRON HCL 4 MG/2ML IJ SOLN: 4.0000 mg | Freq: Once | INTRAMUSCULAR | Status: DC

## 2018-08-05 NOTE — ED Provider Notes (Signed)
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  States that she has had headaches ever since her admission on 07/24/2018 when she had an abnormal CT and CTV.  States her headache never got better.  Is compliant with her Coumadin.  Denies head injury.  Denies numbness, tingling or focal weakness.  No fever.  No vision changes.  No medications tried prior to arrival.  ROS: See HPI Constitutional: no fever  Eyes: no drainage  ENT: no runny nose   Cardiovascular:  no chest pain  Resp: no SOB  GI: no vomiting GU: no dysuria Integumentary: no rash  Allergy: no hives  Musculoskeletal: no leg swelling  Neurological: no slurred speech ROS otherwise negative  PAST MEDICAL HISTORY/PAST SURGICAL HISTORY:  History reviewed. No pertinent past medical history.  MEDICATIONS:  Prior to Admission medications   Medication Sig Start Date End Date Taking? Authorizing Provider  acetaminophen (TYLENOL) 325 MG tablet Take 2 tablets (650 mg total) by mouth every 6 (six) hours as needed for mild pain or headache (do not take over 3000 mg in a day of tylenol. please note there is tylenol in the fiorecet). 07/29/18   Layne Benton, NP  acetaZOLAMIDE (DIAMOX) 250 MG tablet Take 2 tablets (500 mg total) by mouth 2 (two) times daily. Patient not taking: Reported on 07/24/2018 07/09/18   Drema Dallas, DO  butalbital-acetaminophen-caffeine (FIORICET, ESGIC) [id number] MG tablet Take 1 tablet by mouth every 8 (eight) hours as needed for headache. 07/29/18   Layne Benton, NP  enoxaparin (LOVENOX) 60 MG/0.6ML injection Inject 0.6 mLs (60 mg total) into the skin every 12 (twelve) hours. 07/29/18   Layne Benton, NP  topiramate (TOPAMAX) 50 MG tablet Take 1 tablet (50 mg total) by mouth 2 (two) times daily. 07/29/18   Layne Benton, NP   warfarin (COUMADIN) 5 MG tablet Take 1 tablet (5 mg total) by mouth one time only at 6 PM. 07/29/18   Layne Benton, NP    ALLERGIES:  No Known Allergies  SOCIAL HISTORY:  Social History   Tobacco Use  . Smoking status: Never Smoker  . Smokeless tobacco: Never Used  Substance Use Topics  . Alcohol use: No    Alcohol/week: 0.0 standard drinks    Frequency: Never    FAMILY HISTORY: History reviewed. No pertinent family history.  EXAM: BP 108/88   Pulse 80   Temp 98.7 F (37.1 C) (Oral)   Resp 16   LMP 07/20/2018   SpO2 100%  CONSTITUTIONAL: Alert and oriented and responds appropriately to questions. Well-appearing; well-nourished, appears uncomfortable, afebrile HEAD: Normocephalic EYES: Conjunctivae clear, pupils appear equal, EOMI, I do not notice any papilledema ENT: normal nose; moist mucous membranes NECK: Supple, no meningismus, no nuchal rigidity, no LAD  CARD: RRR; S1 and S2 appreciated; no murmurs, no clicks, no rubs, no gallops RESP: Normal chest excursion without splinting or tachypnea; breath sounds clear and equal bilaterally; no wheezes, no rhonchi, no rales, no hypoxia or respiratory distress, speaking full sentences ABD/GI: Normal bowel sounds; non-distended; soft, non-tender, no rebound, no guarding, no peritoneal signs, no hepatosplenomegaly BACK:  The back appears normal and is non-tender to palpation, there is no CVA tenderness EXT: Normal ROM in all joints; non-tender to palpation; no edema; normal capillary refill; no cyanosis, no calf tenderness or swelling  SKIN: Normal color for age and race; warm; no rash NEURO: Moves all extremities equally, strength 5/5 in all 4 extremities, cranial nerves II through XII intact, normal speech, normal sensation diffusely PSYCH: The patient's mood and manner are appropriate. Grooming and personal hygiene are appropriate.  MEDICAL DECISION MAKING: Patient here with complaints of headache.  States that the headache  is similar to when she was diagnosed with a cavernous sinus thrombosis and admitted to the hospital on January 23.  Reports compliance with Coumadin.  No focal neurologic deficits.  No head injury.  Will obtain imaging of her head to ensure there is no intracranial hemorrhage today or worsening of her cavernous sinus thrombosis.  She also has history of pseudotumor but denies vision changes.  He was taken off of Diamox during her last admission.  Unable to perform lumbar puncture at this time given she is anticoagulated as I feel risk outweigh benefits.  No papilledema on examination or vision changes.  Doubt meningitis or encephalitis.  She is currently neurologically intact.  Will treat with IV Reglan, Benadryl, IV fluids.  ED PROGRESS: Patient's labs unremarkable.  INR is therapeutic.  CT scan shows subacute dural venous sinus thrombosis and internal cerebral vein thrombosis with partial recanalization and no acute component or propagation.  Headache completely resolved with IV Reglan and Benadryl.  She is smiling, asking to be discharged.  She has outpatient neurology follow-up.  Recommended Tylenol as needed for pain and will discharge with prescription of Reglan.  Discussed return precautions.   At this time, I do not feel there is any life-threatening condition present. I have reviewed and discussed all results (EKG, imaging, lab, urine as appropriate) and exam findings with patient/family. I have reviewed nursing notes and appropriate previous records.  I feel the patient is safe to be discharged home without further emergent workup and can continue workup as an outpatient as needed. Discussed usual and customary return precautions. Patient/family verbalize understanding and are comfortable with this plan.  Outpatient follow-up has been provided as needed. All questions have been answered.     Khyren Hing, Layla Maw, DO 08/05/18 [id number]

## 2018-08-05 NOTE — Discharge Instructions (Signed)
You may take Tylenol 1000 mg every 6 hours as needed for pain.  This medication is found over-the-counter.  Please follow-up with your neurologist if headaches return.  Please continue your Coumadin as prescribed.  Your labs were normal today including a normal, therapeutic Coumadin level.  Your head CT showed no changes compared to previous.  If you develop worsening headaches that are uncontrolled at home, vision changes, numbness or weakness on one side of your body, fever, please return to the emergency department.

## 2018-08-05 NOTE — ED Notes (Signed)
Patient verbalizes understanding of discharge instructions. Opportunity for questioning and answers were provided. Armband removed by staff, pt discharged from ED ambulatory.   

## 2018-08-11 NOTE — Progress Notes (Signed)
NEUROLOGY FOLLOW UP OFFICE NOTE  Andrea Hayes [id number]  HISTORY OF PRESENT ILLNESS: Andrea Hayes is a 28 year old woman who follow up for papilledema.  UPDATE: Since last visit, she underwent workup for intracranial hypertension.  Lumbar puncture from 07/09/18 demonstrated an opening pressure of 47 cm water with closing pressure of 16 cm water.  CSF cell count, glucose, protein and culture were normal.  She was started on Diamox 500mg  twice daily.  MRV of head from 07/12/18 was personally reviewed and was normal with no evidence of thrombosis or stenosis.  She [facility] on 07/24/18 for severe headache with nausea, vomiting and photophobia.  CT of head was personally reviewed and showed dural venous sinus thrombosis involving the superior sagittal sinus, straight sinus, right transverse sinus and sigmoid sinus and smaller branching veins.  Follow up CTV of head confirmed acute venous sinus thrombosis from superior sagittal sinus to right sigmoid sinus and within intracerebral veins.  Hypercoagulable workup was unremarkable, including homocystein 4.8, antithrombin III mildly elevated at 128, low protein S activity of 22, total protein S 109, protein C activity 180, total protein C 127, lupus anticoagulant 25.3, DRVVT 44.7, beta-2-glycoprotein antibodies <9, factor V leiden mutation negative, prothrombin gene mutation negative, cardiolipin antibodies negative, sickle cell screen negative and negative for pregnancy.  She was started on lovenox and bridged to warfarin.  She was discharged on Fioricet for her headaches.  She was started on topiramate 50mg  twice daily.  Headache never improved and returned to the ED on 08/04/18. INR was therapeutic at 2.43. CT personally reviewed and showed subacute dural venous sinus thrombosis and intracerebral veins with partial recanalization and no acute component.  She received IV Reglan and Benadryl and headache resolved.  She always has a constant  10/10 left sided and bifrontal headache.  She reports nausea.   Fioricet has ran out.    HISTORY: She began experiencing blurred vision and intermittent headaches on top of head.  In October.  She also reports visual obscurations as well.  No pulsatile tinnitus.  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  The next day she went to the ED for further evaluation and treatment.  MRI of brain with and without contrast was personally reviewed and demonstrated tortuous optic nerves with prominent optic nerve sheath fluid which may be seen in setting of intracranial hypertension.  Neurology was consulted and LP was recommended.  However, she declined the procedure and left against medical advice.   PAST MEDICAL HISTORY: No past medical history on file.  MEDICATIONS: Current Outpatient Medications on File Prior to Visit  Medication Sig Dispense Refill  . acetaminophen (TYLENOL) 325 MG tablet Take 2 tablets (650 mg total) by mouth every 6 (six) hours as needed for mild pain or headache (do not take over 3000 mg in a day of tylenol. please note there is tylenol in the fiorecet).    Marland Kitchen. acetaZOLAMIDE (DIAMOX) 250 MG tablet Take 2 tablets (500 mg total) by mouth 2 (two) times daily. (Patient not taking: Reported on 07/24/2018) 120 tablet 3  . butalbital-acetaminophen-caffeine (FIORICET, ESGIC) 50-325-40 MG tablet Take 1 tablet by mouth every 8 (eight) hours as needed for headache. 14 tablet 0  . enoxaparin (LOVENOX) 60 MG/0.6ML injection Inject 0.6 mLs (60 mg total) into the skin every 12 (twelve) hours. (Patient not taking: Reported on 08/05/2018) 6 Syringe 0  . metoCLOPramide (REGLAN) 10 MG tablet Take 1 tablet (10 mg total)  by mouth every 8 (eight) hours as needed for nausea or vomiting (headache). 30 tablet 0  . topiramate (TOPAMAX) 50 MG tablet Take 1 tablet (50 mg total) by mouth 2 (two) times daily. 60 tablet 2  . warfarin (COUMADIN) 5 MG tablet Take 1 tablet (5 mg total) by mouth one  time only at 6 PM. 60 tablet 2   No current facility-administered medications on file prior to visit.     ALLERGIES: No Known Allergies  FAMILY HISTORY: No family history on file.  SOCIAL HISTORY: Social History   Socioeconomic History  . Marital status: Single    Spouse name: Not on file  . Number of children: Not on file  . Years of education: Not on file  . Highest education level: Not on file  Occupational History  . Not on file  Social Needs  . Financial resource strain: Not on file  . Food insecurity:    Worry: Not on file    Inability: Not on file  . Transportation needs:    Medical: Not on file    Non-medical: Not on file  Tobacco Use  . Smoking status: Never Smoker  . Smokeless tobacco: Never Used  Substance and Sexual Activity  . Alcohol use: No    Alcohol/week: 0.0 standard drinks    Frequency: Never  . Drug use: No  . Sexual activity: Not on file  Lifestyle  . Physical activity:    Days per week: Not on file    Minutes per session: Not on file  . Stress: Not on file  Relationships  . Social connections:    Talks on phone: Not on file    Gets together: Not on file    Attends religious service: Not on file    Active member of club or organization: Not on file    Attends meetings of clubs or organizations: Not on file    Relationship status: Not on file  . Intimate partner violence:    Fear of current or ex partner: Not on file    Emotionally abused: Not on file    Physically abused: Not on file    Forced sexual activity: Not on file  Other Topics Concern  . Not on file  Social History Narrative  . Not on file    REVIEW OF SYSTEMS: Constitutional: No fevers, chills, or sweats, no generalized fatigue, change in appetite Eyes: No visual changes, double vision, eye pain Ear, nose and throat: No hearing loss, ear pain, nasal congestion, sore throat Cardiovascular: No chest pain, palpitations Respiratory:  No shortness of breath at rest or with  exertion, wheezes GastrointestinaI: No nausea, vomiting, diarrhea, abdominal pain, fecal incontinence Genitourinary:  No dysuria, urinary retention or frequency Musculoskeletal:  No neck pain, back pain Integumentary: No rash, pruritus, skin lesions Neurological: as above Psychiatric: No depression, insomnia, anxiety Endocrine: No palpitations, fatigue, diaphoresis, mood swings, change in appetite, change in weight, increased thirst Hematologic/Lymphatic:  No purpura, petechiae. Allergic/Immunologic: no itchy/runny eyes, nasal congestion, recent allergic reactions, rashes  PHYSICAL EXAM: Blood pressure 98/78, pulse 84, height 5' 4.5" (1.638 m), weight 132 lb (59.9 kg), last menstrual period 07/20/2018, SpO2 99 %. General: No acute distress.  Patient appears well-groomed.   Head:  Normocephalic/atraumatic Eyes:  Fundi examined but not visualized Neck: supple, no paraspinal tenderness, full range of motion Heart:  Regular rate and rhythm Lungs:  Clear to auscultation bilaterally Back: No paraspinal tenderness Neurological Exam: alert and oriented to person, place, and time. Attention  span and concentration intact, recent and remote memory intact, fund of knowledge intact.  Speech fluent and not dysarthric, language intact.  CN II-XII intact. Bulk and tone normal, muscle strength 5/5 throughout.  Sensation to light touch, temperature and vibration intact.  Deep tendon reflexes 2+ throughout, toes downgoing.  Finger to nose and heel to shin testing intact.  Gait normal, Romberg negative.  IMPRESSION: 1.  Idiopathic intracranial hypertension 2.  Cerebral venous thrombosis, secondary to #1.  Likely will need to be on anticoagulation for at least 6 months. 3.  Intractable chronic headache, secondary to #1 and 2   PLAN: 1.  Continue Diamox 500mg  twice daily and topiramate 50mg  twice daily.  If headaches not improved in 2 weeks, she is to contact us and we can titrate topiramate up to 100mg  twice  daily.  Check BMP prior to follow up. 2.  Acetaminophen-caffeine for acute headache pain.  Limit use of pain relievers to no more than 2 days out of week to prevent risk of rebound or medication-overuse headache. 3.  Zofran for nausea 4.  Warfarin.  Should be managed by PCP as I do not manage anticoagulation. 5.  Referral to ophthalmology 6.  Follow up with PCP.  Follow up with me in 4 months.  Shon MilletAdam Malini Flemings, DO  CC: Fleet ContrasEdwin Avbuere, MD

## 2018-08-13 ENCOUNTER — Encounter

## 2018-08-13 ENCOUNTER — Ambulatory Visit: Payer: BLUE CROSS/BLUE SHIELD | Admitting: Neurology

## 2018-08-13 ENCOUNTER — Encounter: Payer: Self-pay | Admitting: Neurology

## 2018-08-13 VITALS — BP 98/78 | HR 84 | Ht 64.5 in | Wt 132.0 lb

## 2018-08-13 DIAGNOSIS — G08 Intracranial and intraspinal phlebitis and thrombophlebitis: Secondary | ICD-10-CM

## 2018-08-13 DIAGNOSIS — R51 Headache: Secondary | ICD-10-CM | POA: Diagnosis not present

## 2018-08-13 DIAGNOSIS — G932 Benign intracranial hypertension: Secondary | ICD-10-CM

## 2018-08-13 DIAGNOSIS — R519 Headache, unspecified: Secondary | ICD-10-CM

## 2018-08-13 DIAGNOSIS — G8929 Other chronic pain: Secondary | ICD-10-CM

## 2018-08-13 DIAGNOSIS — Z79899 Other long term (current) drug therapy: Secondary | ICD-10-CM | POA: Diagnosis not present

## 2018-08-13 MED ORDER — TOPIRAMATE 50 MG PO TABS: 50.0000 mg | ORAL_TABLET | Freq: Two times a day (BID) | ORAL | 3 refills | Status: DC

## 2018-08-13 MED ORDER — ACETAZOLAMIDE 250 MG PO TABS: 500.0000 mg | ORAL_TABLET | Freq: Two times a day (BID) | ORAL | 3 refills | Status: DC

## 2018-08-13 MED ORDER — ONDANSETRON HCL 4 MG PO TABS: 4.0000 mg | ORAL_TABLET | Freq: Three times a day (TID) | ORAL | 3 refills | Status: DC | PRN

## 2018-08-13 MED ORDER — ACETAMINOPHEN-CAFFEINE 500-65 MG PO TABS: 1.0000 | ORAL_TABLET | Freq: Four times a day (QID) | ORAL | 3 refills | Status: DC | PRN

## 2018-08-13 NOTE — Progress Notes (Signed)
Faxed office notes to PCP at  [facility] [address] [place] [address] P# [phone] F# [phone] Dr. Fleet Contras, M.D.

## 2018-08-13 NOTE — Patient Instructions (Addendum)
1.  Continue acetazolamide 500mg  twice daily and topiramate 50mg  twice daily 2.  Take acetaminophen-caffeine as directed for acute headache pain.  Limit use of pain relievers to no more than 2 days out of week to prevent risk of rebound or medication-overuse headache.  Ondansetron for nausea. 3.  Follow up with the eye doctor 4.  Continue warfarin.  It should be handled by Dr. Concepcion Elk at [facility].  Will send him my note. 5.  Repeat BMP in 4 months (prior to follow up)

## 2018-08-19 ENCOUNTER — Ambulatory Visit: Payer: BLUE CROSS/BLUE SHIELD | Admitting: Neurology

## 2018-09-10 ENCOUNTER — Telehealth: Payer: Self-pay | Admitting: Neurology

## 2018-09-10 MED ORDER — ACETAZOLAMIDE ER 500 MG PO CP12: 1000.0000 mg | ORAL_CAPSULE | Freq: Two times a day (BID) | ORAL | 3 refills | Status: DC

## 2018-09-10 NOTE — Telephone Encounter (Signed)
Called and spoke with Pt, advised her to take two 500 mg acetazolamide BID and to make appt with Dr Alben Spittle in 6 weeks.

## 2018-09-10 NOTE — Telephone Encounter (Signed)
Recent eye exam revealed significant papilledema with visual field defects encroaching central fixation (left greater than right).  Would increase acetazolamide to 1000mg  twice daily.  Should follow up with Dr. Alben Spittle in 6 weeks.

## 2018-09-18 ENCOUNTER — Ambulatory Visit: Payer: BLUE CROSS/BLUE SHIELD | Admitting: Neurology

## 2018-10-19 ENCOUNTER — Other Ambulatory Visit: Payer: Self-pay | Admitting: Neurology

## 2018-12-15 ENCOUNTER — Telehealth: Payer: Self-pay

## 2018-12-15 NOTE — Telephone Encounter (Signed)
Called Dr. Cherlynn Kaiser office to check status of repeat eye exam. Pt had appt 3/26 that had to cx due to COVID, appt 5/14 she r/s till 7/16.

## 2018-12-15 NOTE — Progress Notes (Signed)
-  Virtual Visit via Video Note The purpose of this virtual visit is to provide medical care while limiting exposure to the novel coronavirus.    Consent was obtained for video visit:  Yes.   Answered questions that patient had about telehealth interaction:  Yes.   I discussed the limitations, risks, security and privacy concerns of performing an evaluation and management service by telemedicine. I also discussed with the patient that there may be a patient responsible charge related to this service. The patient expressed understanding and agreed to proceed.  Pt location: Home Physician Location: office Name of referring provider:  Fleet ContrasAvbuere, Edwin, MD I connected with Sharian Goffredo at patients initiation/request on 12/16/2018 at 10:10 AM EDT by video enabled telemedicine application and verified that I am speaking with the correct person using two identifiers. Pt MRN:  [id number] Pt DOB:  09/03/1990 Video Participants:  Shere Villada   History of Present Illness:  Andrea Hayes is a 28 year old woman who follow up for papilledema.  UPDATE: Current medications:  Diamox 1000mg  twice daily; topiramate 50mg  twice daily; acetaminophen-caffeine PRN; Zofran; Warfarin.  She had a repeat eye exam by Dr. Alben SpittleWeaver in March 2020 and was found to have significant papilledema with visual field defects encroaching central fixation (left greater than right).  She was advised to increase Diamox to 1000mg  twice daily.   She may get a moderate headache once a week for a couple of hours.  Often triggered by hunger.    HISTORY: She began experiencing blurred vision and intermittent headacheson top of head. In October.She also reports visual obscurations as well. 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 The next day she went to the ED for further evaluation and treatment. MRI of brain with and without contrast was personally reviewed and demonstrated  tortuous optic nerves with prominent optic nerve sheath fluid which may be seen in setting of intracranial hypertension. Neurology was consulted and LP was recommended. However, she declined the procedure and left against medical advice.   She followed up with me in January 2020 and underwent workup for intracranial hypertension.  Lumbar puncture from 07/09/18 demonstrated an opening pressure of 47 cm water with closing pressure of 16 cm water.  CSF cell count, glucose, protein and culture were normal.  She was started on Diamox 500mg  twice daily.  MRV of head from 07/12/18 was personally reviewed and was normal with no evidence of thrombosis or stenosis.  She presented to [facility] on 07/24/18 for severe headache with nausea, vomiting and photophobia.  CT of head was personally reviewed and showed dural venous sinus thrombosis involving the superior sagittal sinus, straight sinus, right transverse sinus and sigmoid sinus and smaller branching veins.  Follow up CTV of head confirmed acute venous sinus thrombosis from superior sagittal sinus to right sigmoid sinus and within intracerebral veins.  Hypercoagulable workup was unremarkable, including homocystein 4.8, antithrombin III mildly elevated at 128, low protein S activity of 22, total protein S 109, protein C activity 180, total protein C 127, lupus anticoagulant 25.3, DRVVT 44.7, beta-2-glycoprotein antibodies <9, factor V leiden mutation negative, prothrombin gene mutation negative, cardiolipin antibodies negative, sickle cell screen negative and negative for pregnancy.  She was started on lovenox and bridged to warfarin.  She was discharged on Fioricet for her headaches.  She was started on topiramate 50mg  twice daily.  Headache never improved and returned to the ED on 08/04/18. INR was therapeutic at  2.43. CT personally reviewed and showed subacute dural venous sinus thrombosis and intracerebral veins with partial recanalization and no acute  component.  She received IV Reglan and Benadryl and headache resolved.  She continued to have a constant 10/10 left sided and bifrontal headache.   Past Medical History: No past medical history on file.  Medications: Outpatient Encounter Medications as of 12/16/2018  Medication Sig  . acetaminophen (TYLENOL) 325 MG tablet Take 2 tablets (650 mg total) by mouth every 6 (six) hours as needed for mild pain or headache (do not take over 3000 mg in a day of tylenol. please note there is tylenol in the fiorecet).  . Acetaminophen-Caffeine 500-65 MG TABS Take 1 tablet by mouth every 6 (six) hours as needed.  Marland Kitchen acetaZOLAMIDE (DIAMOX SEQUELS) 500 MG capsule Take 2 capsules (1,000 mg total) by mouth 2 (two) times daily.  . butalbital-acetaminophen-caffeine (FIORICET, ESGIC) 50-325-40 MG tablet Take 1 tablet by mouth every 8 (eight) hours as needed for headache.  . enoxaparin (LOVENOX) 60 MG/0.6ML injection Inject 0.6 mLs (60 mg total) into the skin every 12 (twelve) hours. (Patient not taking: Reported on 08/05/2018)  . metoCLOPramide (REGLAN) 10 MG tablet Take 1 tablet (10 mg total) by mouth every 8 (eight) hours as needed for nausea or vomiting (headache).  . ondansetron (ZOFRAN) 4 MG tablet Take 1 tablet (4 mg total) by mouth every 8 (eight) hours as needed for nausea or vomiting.  . topiramate (TOPAMAX) 50 MG tablet TAKE 1 TABLET BY MOUTH TWICE A DAY  . warfarin (COUMADIN) 5 MG tablet Take 1 tablet (5 mg total) by mouth one time only at 6 PM.   No facility-administered encounter medications on file as of 12/16/2018.     Allergies: No Known Allergies  Family History: No family history on file.  Social History: Social History   Socioeconomic History  . Marital status: Single    Spouse name: Not on file  . Number of children: Not on file  . Years of education: Not on file  . Highest education level: Not on file  Occupational History  . Not on file  Social Needs  . Financial resource strain:  Not on file  . Food insecurity    Worry: Not on file    Inability: Not on file  . Transportation needs    Medical: Not on file    Non-medical: Not on file  Tobacco Use  . Smoking status: Never Smoker  . Smokeless tobacco: Never Used  Substance and Sexual Activity  . Alcohol use: No    Alcohol/week: 0.0 standard drinks    Frequency: Never  . Drug use: No  . Sexual activity: Not on file  Lifestyle  . Physical activity    Days per week: Not on file    Minutes per session: Not on file  . Stress: Not on file  Relationships  . Social Herbalist on phone: Not on file    Gets together: Not on file    Attends religious service: Not on file    Active member of club or organization: Not on file    Attends meetings of clubs or organizations: Not on file    Relationship status: Not on file  . Intimate partner violence    Fear of current or ex partner: Not on file    Emotionally abused: Not on file    Physically abused: Not on file    Forced sexual activity: Not on file  Other Topics  Concern  . Not on file  Social History Narrative  . Not on file   Observations/Objective:   There were no vitals taken for this visit. No acute distress, alert and oriented.  Speech fluent and not dysarthric.  Language intact.  Face symmetric.    Assessment and Plan:   1.  Intracranial Hypertension 2.  Cerebral venous thrombosis 3.  Headache, not intractable, improved  1.  Continue Diamox 1000mg  twice daily and topiramate 50mg  twice daily. 2.  Fioricet PRN for headache.  Limit use of pain relievers to no more than 2 days out of week to prevent risk of rebound or medication-overuse headache. 3.  Warfarin as per PCP 4.  Follow up with Dr. Alben SpittleWeaver in July as scheduled 5.  Follow up with me in 4 months.  Follow Up Instructions:    -I discussed the assessment and treatment plan with the patient. The patient was provided an opportunity to ask questions and all were answered. The patient  agreed with the plan and demonstrated an understanding of the instructions.   The patient was advised to call back or seek an in-person evaluation if the symptoms worsen or if the condition fails to improve as anticipated.    Cira ServantAdam Robert Devora Tortorella, DO

## 2018-12-16 ENCOUNTER — Telehealth (INDEPENDENT_AMBULATORY_CARE_PROVIDER_SITE_OTHER): Payer: BLUE CROSS/BLUE SHIELD | Admitting: Neurology

## 2018-12-16 ENCOUNTER — Encounter: Payer: Self-pay | Admitting: Neurology

## 2018-12-16 ENCOUNTER — Other Ambulatory Visit: Payer: Self-pay

## 2018-12-16 DIAGNOSIS — G932 Benign intracranial hypertension: Secondary | ICD-10-CM | POA: Diagnosis not present

## 2018-12-16 DIAGNOSIS — R519 Headache, unspecified: Secondary | ICD-10-CM

## 2018-12-16 DIAGNOSIS — G08 Intracranial and intraspinal phlebitis and thrombophlebitis: Secondary | ICD-10-CM

## 2019-01-14 ENCOUNTER — Other Ambulatory Visit: Payer: Self-pay | Admitting: Neurology

## 2019-04-16 NOTE — Progress Notes (Deleted)
NEUROLOGY FOLLOW UP OFFICE NOTE  [name] [id number]  HISTORY OF PRESENT ILLNESS: Andrea Hayes is a 28 year old woman who follow up for intracranial hypertension and cerebral venous thrombosis  UPDATE: Current medications:  Diamox 1000mg  twice daily; topiramate 50mg  twice daily; acetaminophen-caffeine PRN; Zofran; Warfarin.  She had a repeat eye exam by Dr. in March 2020 and was found to have significant papilledema with visual field defects encroaching central fixation (left greater than right).  She was advised to increase Diamox to 1000mg  twice daily.   She may get a moderate headache once a week for a couple of hours.  Often triggered by hunger.    HISTORY: She began experiencing blurred vision and intermittent headacheson top of head. In October.She also reports visual obscurations as well. 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 The next day she went to the ED for further evaluation and treatment. MRI of brain with and without contrast was personally reviewed and demonstrated tortuous optic nerves with prominent optic nerve sheath fluid which may be seen in setting of intracranial hypertension. Neurology was consulted and LP was recommended. However, she declined the procedure and left against medical advice.  She followed up with me in January 2020 and underwent workup for intracranial hypertension. Lumbar puncture from 07/09/18 demonstrated an opening pressure of 47 cm water with closing pressure of 16 cm water. CSF cell count, glucose, protein and culture were normal. She was started on Diamox 500mg  twice daily. MRV of head from 07/12/18 was personally reviewed and was normal with no evidence of thrombosis or stenosis. She presented to [facility] on 07/24/18 for severe headache with nausea, vomiting and photophobia. CT of head was personally reviewed and showed dural venous sinus thrombosis  involving the superior sagittal sinus, straight sinus, right transverse sinus and sigmoid sinus and smaller branching veins. Follow up CTV of head confirmed acute venous sinus thrombosis from superior sagittal sinus to right sigmoid sinus and within intracerebral veins. Hypercoagulable workup was unremarkable, includinghomocystein 4.8, antithrombin III mildly elevated at 128, low protein S activity of 22, total protein S 109, protein C activity 180, total protein C 127, lupus anticoagulant 25.3, DRVVT 44.7, beta-2-glycoprotein antibodies <9, factor V leiden mutation negative, prothrombin gene mutation negative, cardiolipin antibodies negative, sickle cell screen negative and negative for pregnancy. She was started on lovenox and bridged to warfarin. She was discharged on Fioricet for her headaches. She was started on topiramate 50mg  twice daily.Headache never improved and returned to the ED on 08/04/18. INR was therapeutic at2.43. CT personally reviewed and showed subacute dural venous sinus thrombosis and intracerebral veins with partial recanalization and no acute component. She received IV Reglan and Benadryl and headache resolved. She continued to have a constant 10/10 left sided and bifrontal headache.   PAST MEDICAL HISTORY: No past medical history on file.  MEDICATIONS: Current Outpatient Medications on File Prior to Visit  Medication Sig Dispense Refill  . acetaminophen (TYLENOL) 325 MG tablet Take 2 tablets (650 mg total) by mouth every 6 (six) hours as needed for mild pain or headache (do not take over 3000 mg in a day of tylenol. please note there is tylenol in the fiorecet).    . Acetaminophen-Caffeine 500-65 MG TABS Take 1 tablet by mouth every 6 (six) hours as needed. 30 each 3  . acetaZOLAMIDE (DIAMOX SEQUELS) 500 MG capsule Take 2 capsules (1,000 mg total) by mouth 2 (two) times daily. 120 capsule  3  . butalbital-acetaminophen-caffeine (FIORICET, ESGIC) 50-325-40 MG tablet Take 1  tablet by mouth every 8 (eight) hours as needed for headache. 14 tablet 0  . metoCLOPramide (REGLAN) 10 MG tablet Take 1 tablet (10 mg total) by mouth every 8 (eight) hours as needed for nausea or vomiting (headache). 30 tablet 0  . ondansetron (ZOFRAN) 4 MG tablet Take 1 tablet (4 mg total) by mouth every 8 (eight) hours as needed for nausea or vomiting. 20 tablet 3  . topiramate (TOPAMAX) 50 MG tablet TAKE 1 TABLET BY MOUTH TWICE A DAY 180 tablet 1  . warfarin (COUMADIN) 5 MG tablet Take 1 tablet (5 mg total) by mouth one time only at 6 PM. 60 tablet 2   No current facility-administered medications on file prior to visit.     ALLERGIES: No Known Allergies  FAMILY HISTORY: ***.  SOCIAL HISTORY: Social History   Socioeconomic History  . Marital status: Single    Spouse name: Not on file  . Number of children: Not on file  . Years of education: Not on file  . Highest education level: Not on file  Occupational History  . Not on file  Social Needs  . Financial resource strain: Not on file  . Food insecurity    Worry: Not on file    Inability: Not on file  . Transportation needs    Medical: Not on file    Non-medical: Not on file  Tobacco Use  . Smoking status: Never Smoker  . Smokeless tobacco: Never Used  Substance and Sexual Activity  . Alcohol use: No    Alcohol/week: 0.0 standard drinks    Frequency: Never  . Drug use: No  . Sexual activity: Not on file  Lifestyle  . Physical activity    Days per week: Not on file    Minutes per session: Not on file  . Stress: Not on file  Relationships  . Social Musicianconnections    Talks on phone: Not on file    Gets together: Not on file    Attends religious service: Not on file    Active member of club or organization: Not on file    Attends meetings of clubs or organizations: Not on file    Relationship status: Not on file  . Intimate partner violence    Fear of current or ex partner: Not on file    Emotionally abused: Not on  file    Physically abused: Not on file    Forced sexual activity: Not on file  Other Topics Concern  . Not on file  Social History Narrative  . Not on file    REVIEW OF SYSTEMS: Constitutional: No fevers, chills, or sweats, no generalized fatigue, change in appetite Eyes: No visual changes, double vision, eye pain Ear, nose and throat: No hearing loss, ear pain, nasal congestion, sore throat Cardiovascular: No chest pain, palpitations Respiratory:  No shortness of breath at rest or with exertion, wheezes GastrointestinaI: No nausea, vomiting, diarrhea, abdominal pain, fecal incontinence Genitourinary:  No dysuria, urinary retention or frequency Musculoskeletal:  No neck pain, back pain Integumentary: No rash, pruritus, skin lesions Neurological: as above Psychiatric: No depression, insomnia, anxiety Endocrine: No palpitations, fatigue, diaphoresis, mood swings, change in appetite, change in weight, increased thirst Hematologic/Lymphatic:  No purpura, petechiae. Allergic/Immunologic: no itchy/runny eyes, nasal congestion, recent allergic reactions, rashes  PHYSICAL EXAM: *** General: No acute distress.  Patient appears ***-groomed.   Head:  Normocephalic/atraumatic Eyes:  Fundi examined but not  visualized Neck: supple, no paraspinal tenderness, full range of motion Heart:  Regular rate and rhythm Lungs:  Clear to auscultation bilaterally Back: No paraspinal tenderness Neurological Exam: alert and oriented to person, place, and time. Attention span and concentration intact, recent and remote memory intact, fund of knowledge intact.  Speech fluent and not dysarthric, language intact.  CN II-XII intact. Bulk and tone normal, muscle strength 5/5 throughout.  Sensation to light touch, temperature and vibration intact.  Deep tendon reflexes 2+ throughout, toes downgoing.  Finger to nose and heel to shin testing intact.  Gait normal, Romberg negative.  IMPRESSION: 1.  Intracranial  Hypertension 2.  Cerebral venous thrombosis 3.  Headache, not intractable, improved  PLAN: ***  Metta Clines, DO  CC: [name], MD

## 2019-04-20 ENCOUNTER — Ambulatory Visit: Payer: BLUE CROSS/BLUE SHIELD | Admitting: Neurology

## 2019-05-26 ENCOUNTER — Other Ambulatory Visit: Payer: Self-pay

## 2019-05-26 ENCOUNTER — Emergency Department (HOSPITAL_COMMUNITY): Payer: BLUE CROSS/BLUE SHIELD

## 2019-05-26 ENCOUNTER — Emergency Department (HOSPITAL_COMMUNITY)
Admission: EM | Admit: 2019-05-26 | Discharge: 2019-05-26 | Disposition: A | Discharge status: Home / Self Care | Payer: BLUE CROSS/BLUE SHIELD | Attending: Emergency Medicine | Admitting: Emergency Medicine

## 2019-05-26 DIAGNOSIS — Y999 Unspecified external cause status: Secondary | ICD-10-CM | POA: Insufficient documentation

## 2019-05-26 DIAGNOSIS — Z79899 Other long term (current) drug therapy: Secondary | ICD-10-CM | POA: Insufficient documentation

## 2019-05-26 DIAGNOSIS — M25532 Pain in left wrist: Secondary | ICD-10-CM | POA: Diagnosis not present

## 2019-05-26 DIAGNOSIS — S6992XA Unspecified injury of left wrist, hand and finger(s), initial encounter: Secondary | ICD-10-CM | POA: Diagnosis present

## 2019-05-26 DIAGNOSIS — X500XXA Overexertion from strenuous movement or load, initial encounter: Secondary | ICD-10-CM | POA: Diagnosis not present

## 2019-05-26 DIAGNOSIS — Y92481 Parking lot as the place of occurrence of the external cause: Secondary | ICD-10-CM | POA: Insufficient documentation

## 2019-05-26 DIAGNOSIS — Y939 Activity, unspecified: Secondary | ICD-10-CM | POA: Insufficient documentation

## 2019-05-26 NOTE — Progress Notes (Signed)
Orthopedic Tech Progress Note Patient Details:  Andrea Hayes [birth year] [id number]  Ortho Devices Type of Ortho Device: Velcro wrist splint Ortho Device/Splint Location: LUE Ortho Device/Splint Interventions: Ordered, Application, Adjustment   Post Interventions Patient Tolerated: Well Instructions Provided: Adjustment of device, Care of device   [place] 05/26/2019, 8:10 PM
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
## 2019-05-26 NOTE — ED Notes (Signed)
Called ortho for splint 

## 2019-05-26 NOTE — Discharge Instructions (Signed)
Your arm and wrist are not broken. I have placed your arm in a splint to help with stability and pain. You may take over the counter ibuprofen as needed for pain. Follow-up with your PCP if your symptoms do not improve within the next week. Return to the ER for new or worsening symptoms.
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
## 2019-05-26 NOTE — ED Notes (Signed)
Patient verbalizes understanding of discharge instructions. Opportunity for questioning and answers were provided. Armband removed by staff, pt discharged from ED.  

## 2019-05-26 NOTE — ED Notes (Signed)
Unaware pt had legal guardian until dispo. Do not see numbers for names mentioned as guardians.

## 2019-11-03 ENCOUNTER — Encounter (HOSPITAL_COMMUNITY): Payer: Self-pay

## 2019-11-03 ENCOUNTER — Emergency Department (HOSPITAL_COMMUNITY)
Admission: EM | Admit: 2019-11-03 | Discharge: 2019-11-03 | Disposition: A | Discharge status: Home / Self Care | Payer: 59 | Attending: Emergency Medicine | Admitting: Emergency Medicine

## 2019-11-03 DIAGNOSIS — H60391 Other infective otitis externa, right ear: Secondary | ICD-10-CM | POA: Diagnosis not present

## 2019-11-03 DIAGNOSIS — Z79899 Other long term (current) drug therapy: Secondary | ICD-10-CM | POA: Diagnosis not present

## 2019-11-03 DIAGNOSIS — H9201 Otalgia, right ear: Secondary | ICD-10-CM | POA: Diagnosis present

## 2019-11-03 MED ORDER — ACETAMINOPHEN 500 MG PO TABS
1000.0000 mg | ORAL_TABLET | Freq: Once | ORAL | Status: AC
  Administered 2019-11-03: 1000 mg via ORAL
  Filled 2019-11-03: qty 2

## 2019-11-03 MED ORDER — NEOMYCIN-POLYMYXIN-HC 3.5-10000-1 OT SUSP: 4.0000 [drp] | Freq: Three times a day (TID) | OTIC | 0 refills | Status: AC

## 2019-11-03 NOTE — Discharge Instructions (Addendum)
Use Tylenol every 4 hours as needed for pain. Use antibiotic drops in your ear 3 times a day. Return for worsening swelling, no improvement in 1 week or new concerns. Avoid swimming pools and bathtubs until healed

## 2019-11-03 NOTE — ED Triage Notes (Signed)
Pt reports R ear pain since Sat that is getting worse

## 2019-11-03 NOTE — ED Provider Notes (Signed)
[facility] EMERGENCY DEPARTMENT Provider Note   CSN: [id number] Arrival date & time: 11/03/19  2841     History Chief Complaint  Patient presents with  . Otalgia    Andrea Hayes is a 29 y.o. female.  Patient with history of stroke, on Coumadin presents with right ear pain since Saturday gradually worsening.  Patient denies any swimming pool or injury.  No history of similar.  Pain with movement of her ear.  No fevers chills or vomiting.  No other concerns.        History reviewed. No pertinent past medical history.  Patient Active Problem List   Diagnosis Date Noted  . Acute right arterial ischemic stroke, middle cerebral artery (MCA) ([place])   . Dural venous sinus thrombosis   . Acute blood loss anemia   . Intracranial hypertension   . Cerebral venous thrombosis 07/24/2018    History reviewed. No pertinent surgical history.   OB History   No obstetric history on file.     No family history on file.  Social History   Tobacco Use  . Smoking status: Never Smoker  . Smokeless tobacco: Never Used  Substance Use Topics  . Alcohol use: No    Alcohol/week: 0.0 standard drinks  . Drug use: No    Home Medications Prior to Admission medications   Medication Sig Start Date End Date Taking? Authorizing Provider  acetaminophen (TYLENOL) 325 MG tablet Take 2 tablets (650 mg total) by mouth every 6 (six) hours as needed for mild pain or headache (do not take over 3000 mg in a day of tylenol. please note there is tylenol in the fiorecet). 07/29/18   Donzetta Starch, NP  Acetaminophen-Caffeine 500-65 MG TABS Take 1 tablet by mouth every 6 (six) hours as needed. 08/13/18   Tomi Likens, Adam R, DO  acetaZOLAMIDE (DIAMOX SEQUELS) 500 MG capsule Take 2 capsules (1,000 mg total) by mouth 2 (two) times daily. 09/10/18   [name], DO  butalbital-acetaminophen-caffeine (FIORICET, ESGIC) [phone] MG tablet Take 1 tablet by mouth every 8 (eight) hours as needed for  headache. 07/29/18   Donzetta Starch, NP  metoCLOPramide (REGLAN) 10 MG tablet Take 1 tablet (10 mg total) by mouth every 8 (eight) hours as needed for nausea or vomiting (headache). 08/05/18   Ward, Delice Bison, DO  neomycin-polymyxin-hydrocortisone (CORTISPORIN) 3.5-10000-1 OTIC suspension Place 4 drops into the right ear 3 (three) times daily for 7 days. 11/03/19 11/10/19  Elnora Morrison, MD  ondansetron (ZOFRAN) 4 MG tablet Take 1 tablet (4 mg total) by mouth every 8 (eight) hours as needed for nausea or vomiting. 08/13/18   Tomi Likens, Adam R, DO  topiramate (TOPAMAX) 50 MG tablet TAKE 1 TABLET BY MOUTH TWICE A DAY 01/14/19   Tomi Likens, Adam R, DO  warfarin (COUMADIN) 5 MG tablet Take 1 tablet (5 mg total) by mouth one time only at 6 PM. 07/29/18   Donzetta Starch, NP    Allergies    Patient has no known allergies.  Review of Systems   Review of Systems  Constitutional: Negative for chills and fever.  HENT: Positive for ear pain. Negative for congestion.   Eyes: Negative for visual disturbance.  Gastrointestinal: Negative for vomiting.  Musculoskeletal: Negative for back pain, neck pain and neck stiffness.  Skin: Negative for rash.  Neurological: Negative for light-headedness and headaches.    Physical Exam Updated Vital Signs BP 110/71 (BP Location: Left Arm)   Pulse 94   Temp 97.6  F (36.4 C) (Oral)   Resp 16   SpO2 98%   Physical Exam Vitals and nursing note reviewed.  Constitutional:      General: She is not in acute distress.    Appearance: She is well-developed.  HENT:     Head: Normocephalic and atraumatic.     Comments: Patient has inflamed external auditory canal on the right, tympanic membrane intact without sign of deep infection.  Pain with moving ear on the right.  Mild lymphadenopathy tender periauricular.  No mastoid tenderness. Eyes:     Conjunctiva/sclera: Conjunctivae normal.  Cardiovascular:     Rate and Rhythm: Normal rate.     Heart sounds: No murmur.  Pulmonary:      Effort: Pulmonary effort is normal. No respiratory distress.  Musculoskeletal:     Cervical back: Neck supple.  Skin:    General: Skin is warm and dry.  Neurological:     Mental Status: She is alert.     ED Results / Procedures / Treatments   Labs (all labs ordered are listed, but only abnormal results are displayed) Labs Reviewed - No data to display  EKG None  Radiology No results found.  Procedures Procedures (including critical care time)  Medications Ordered in ED Medications  acetaminophen (TYLENOL) tablet 1,000 mg (has no administration in time range)    ED Course  I have reviewed the triage vital signs and the nursing notes.  Pertinent labs & imaging results that were available during my care of the patient were reviewed by me and considered in my medical decision making (see chart for details).    MDM Rules/Calculators/A&P                      Patient presents with clinical concern for otitis externa.  No sign of mastoiditis on exam.  Plan for Tylenol for pain and topical antibiotics with steroids with reassessment outpatient. Final Clinical Impression(s) / ED Diagnoses Final diagnoses:  Acute infective otitis externa, right    Rx / DC Orders ED Discharge Orders         Ordered    neomycin-polymyxin-hydrocortisone (CORTISPORIN) 3.5-10000-1 OTIC suspension  3 times daily     11/03/19 0737           Blane Ohara, MD 11/03/19 [phone]

## 2020-01-27 ENCOUNTER — Emergency Department (HOSPITAL_COMMUNITY)
Admission: EM | Admit: 2020-01-27 | Discharge: 2020-01-27 | Disposition: A | Discharge status: Home / Self Care | Payer: 59 | Attending: Emergency Medicine | Admitting: Emergency Medicine

## 2020-01-27 ENCOUNTER — Other Ambulatory Visit: Payer: Self-pay

## 2020-01-27 ENCOUNTER — Emergency Department (HOSPITAL_COMMUNITY): Payer: 59

## 2020-01-27 ENCOUNTER — Encounter (HOSPITAL_COMMUNITY): Payer: Self-pay | Admitting: *Deleted

## 2020-01-27 DIAGNOSIS — Z5321 Procedure and treatment not carried out due to patient leaving prior to being seen by health care provider: Secondary | ICD-10-CM | POA: Diagnosis not present

## 2020-01-27 DIAGNOSIS — R0789 Other chest pain: Secondary | ICD-10-CM | POA: Diagnosis not present

## 2020-01-27 LAB — BASIC METABOLIC PANEL
Anion gap: 8 (ref 5–15)
BUN: 10 mg/dL (ref 6–20)
CO2: 26 mmol/L (ref 22–32)
Calcium: 9.4 mg/dL (ref 8.9–10.3)
Chloride: 99 mmol/L (ref 98–111)
Creatinine, Ser: 0.59 mg/dL (ref 0.44–1.00)
GFR calc Af Amer: >60 mL/min (ref >60)
GFR calc non Af Amer: >60 mL/min (ref >60)
Glucose, Bld: 120 mg/dL — ABNORMAL HIGH (ref 70–99)
Potassium: 4.4 mmol/L (ref 3.5–5.1)
Sodium: 133 mmol/L — ABNORMAL LOW (ref 135–145)

## 2020-01-27 LAB — CBC
HCT: 41.3 % (ref 36.0–46.0)
Hemoglobin: 13.4 g/dL (ref 12.0–15.0)
MCH: 29.5 pg (ref 26.0–34.0)
MCHC: 32.4 g/dL (ref 30.0–36.0)
MCV: 90.8 fL (ref 80.0–100.0)
Platelets: 251 10*3/uL (ref 150–400)
RBC: 4.55 MIL/uL (ref 3.87–5.11)
RDW: 13.6 % (ref 11.5–15.5)
WBC: 6.4 10*3/uL (ref 4.0–10.5)
nRBC: 0 % (ref 0.0–0.2)

## 2020-01-27 LAB — I-STAT BETA HCG BLOOD, ED (MC, WL, AP ONLY): I-stat hCG, quantitative: <5 m[IU]/mL (ref <5)

## 2020-01-27 LAB — TROPONIN I (HIGH SENSITIVITY): Troponin I (High Sensitivity): 4 ng/L (ref <18)

## 2020-01-27 MED ORDER — SODIUM CHLORIDE 0.9% FLUSH: 3.0000 mL | Freq: Once | INTRAVENOUS | Status: DC

## 2020-01-27 NOTE — ED Notes (Signed)
Pt is walking out stated she has to go to work

## 2020-01-27 NOTE — ED Triage Notes (Signed)
Pt is here with upper chest pain that stated last week.  Reports intermittent and increases with movement and working hard.  No cough, fever, or sob

## 2020-03-22 ENCOUNTER — Encounter (HOSPITAL_COMMUNITY): Payer: Self-pay | Admitting: Obstetrics & Gynecology

## 2020-03-22 ENCOUNTER — Other Ambulatory Visit: Payer: Self-pay

## 2020-03-22 ENCOUNTER — Inpatient Hospital Stay (HOSPITAL_COMMUNITY)
Admission: AD | Admit: 2020-03-22 | Discharge: 2020-03-22 | Disposition: A | Discharge status: Home / Self Care | Payer: 59 | Attending: Obstetrics & Gynecology | Admitting: Obstetrics & Gynecology

## 2020-03-22 ENCOUNTER — Inpatient Hospital Stay (HOSPITAL_COMMUNITY): Payer: 59

## 2020-03-22 DIAGNOSIS — O99281 Endocrine, nutritional and metabolic diseases complicating pregnancy, first trimester: Secondary | ICD-10-CM | POA: Insufficient documentation

## 2020-03-22 DIAGNOSIS — R141 Gas pain: Secondary | ICD-10-CM | POA: Insufficient documentation

## 2020-03-22 DIAGNOSIS — Z7901 Long term (current) use of anticoagulants: Secondary | ICD-10-CM | POA: Insufficient documentation

## 2020-03-22 DIAGNOSIS — R1084 Generalized abdominal pain: Secondary | ICD-10-CM | POA: Insufficient documentation

## 2020-03-22 DIAGNOSIS — K9049 Malabsorption due to intolerance, not elsewhere classified: Secondary | ICD-10-CM

## 2020-03-22 DIAGNOSIS — Z79899 Other long term (current) drug therapy: Secondary | ICD-10-CM | POA: Insufficient documentation

## 2020-03-22 DIAGNOSIS — R109 Unspecified abdominal pain: Secondary | ICD-10-CM

## 2020-03-22 DIAGNOSIS — O26899 Other specified pregnancy related conditions, unspecified trimester: Secondary | ICD-10-CM

## 2020-03-22 DIAGNOSIS — Z3A01 Less than 8 weeks gestation of pregnancy: Secondary | ICD-10-CM | POA: Diagnosis not present

## 2020-03-22 DIAGNOSIS — O99891 Other specified diseases and conditions complicating pregnancy: Secondary | ICD-10-CM | POA: Diagnosis not present

## 2020-03-22 DIAGNOSIS — E739 Lactose intolerance, unspecified: Secondary | ICD-10-CM | POA: Insufficient documentation

## 2020-03-22 DIAGNOSIS — O26891 Other specified pregnancy related conditions, first trimester: Secondary | ICD-10-CM | POA: Diagnosis not present

## 2020-03-22 LAB — CBC
HCT: 39.6 % (ref 36.0–46.0)
Hemoglobin: 12.8 g/dL (ref 12.0–15.0)
MCH: 28.4 pg (ref 26.0–34.0)
MCHC: 32.3 g/dL (ref 30.0–36.0)
MCV: 88 fL (ref 80.0–100.0)
Platelets: 87 10*3/uL — ABNORMAL LOW (ref 150–400)
RBC: 4.5 MIL/uL (ref 3.87–5.11)
RDW: 13.5 % (ref 11.5–15.5)
WBC: 8.8 10*3/uL (ref 4.0–10.5)
nRBC: 0 % (ref 0.0–0.2)

## 2020-03-22 LAB — WET PREP, GENITAL
Clue Cells Wet Prep HPF POC: NONE SEEN
Sperm: NONE SEEN
Trich, Wet Prep: NONE SEEN
Yeast Wet Prep HPF POC: NONE SEEN

## 2020-03-22 LAB — URINALYSIS, ROUTINE W REFLEX MICROSCOPIC
Bilirubin Urine: NEGATIVE
Glucose, UA: NEGATIVE mg/dL
Hgb urine dipstick: NEGATIVE
Ketones, ur: NEGATIVE mg/dL
Leukocytes,Ua: NEGATIVE
Nitrite: NEGATIVE
Protein, ur: NEGATIVE mg/dL
Specific Gravity, Urine: 1.011 (ref 1.005–1.030)
pH: 5 (ref 5.0–8.0)

## 2020-03-22 LAB — HCG, QUANTITATIVE, PREGNANCY: hCG, Beta Chain, Quant, S: 3061 m[IU]/mL — ABNORMAL HIGH (ref <5)

## 2020-03-22 LAB — POCT PREGNANCY, URINE: Preg Test, Ur: POSITIVE — AB

## 2020-03-22 MED ORDER — SIMETHICONE 80 MG PO CHEW
80.0000 mg | CHEWABLE_TABLET | Freq: Once | ORAL | Status: AC
  Administered 2020-03-22: 80 mg via ORAL
  Filled 2020-03-22: qty 1

## 2020-03-22 NOTE — MAU Note (Signed)
.   Andrea Hayes is a 29 y.o. at [redacted]w[redacted]d here in MAU reporting:she has had lower abdominal cramping for a few days. 2 + home pregnancy test. Denies any vaginal bleeding LMP: 02/14/20  Onset of complaint: 2 days Pain score: 10 Vitals:   03/22/20 0734  BP: 115/68  Pulse: 98  Resp: 20  Temp: 97.7 F (36.5 C)  SpO2: 99%     FHT: Lab orders placed from triage: UA/UPT

## 2020-03-22 NOTE — MAU Provider Note (Signed)
Chief Complaint: Abdominal Pain   First Provider Initiated Contact with Patient 03/22/20 [id number]     SUBJECTIVE HPI: Andrea Hayes is a 29 y.o. G1P0 at [redacted]w[redacted]d who presents to [facility] Admissions reporting diffuse abdominal pain. Initially she described her pain as lower abdominal cramping, but when asked again, she described it as diffuse abdominal pain with NO pelvic pain or cramping. She had several bowel movements yesterday, and denies n/v/d today. Also denies vaginal bleeding or discharge.   History reviewed. No pertinent past medical history. OB History  Gravida Para Term Preterm AB Living  1            SAB TAB Ectopic Multiple Live Births               # Outcome Date GA Lbr Len/2nd Weight Sex Delivery Anes PTL Lv  1 Current            History reviewed. No pertinent surgical history. Social History   Socioeconomic History  . Marital status: Single    Spouse name: Not on file  . Number of children: Not on file  . Years of education: Not on file  . Highest education level: Not on file  Occupational History  . Not on file  Tobacco Use  . Smoking status: Never Smoker  . Smokeless tobacco: Never Used  Vaping Use  . Vaping Use: Never used  Substance and Sexual Activity  . Alcohol use: No    Alcohol/week: 0.0 standard drinks  . Drug use: No  . Sexual activity: Not on file  Other Topics Concern  . Not on file  Social History Narrative  . Not on file   Social Determinants of Health   Financial Resource Strain:   . Difficulty of Paying Living Expenses: Not on file  Food Insecurity:   . Worried About Programme researcher, broadcasting/film/video in the Last Year: Not on file  . Ran Out of Food in the Last Year: Not on file  Transportation Needs:   . Lack of Transportation (Medical): Not on file  . Lack of Transportation (Non-Medical): Not on file  Physical Activity:   . Days of Exercise per Week: Not on file  . Minutes of Exercise per Session: Not on file  Stress:   . Feeling of Stress : Not on  file  Social Connections:   . Frequency of Communication with Friends and Family: Not on file  . Frequency of Social Gatherings with Friends and Family: Not on file  . Attends Religious Services: Not on file  . Active Member of Clubs or Organizations: Not on file  . Attends Banker Meetings: Not on file  . Marital Status: Not on file  Intimate Partner Violence:   . Fear of Current or Ex-Partner: Not on file  . Emotionally Abused: Not on file  . Physically Abused: Not on file  . Sexually Abused: Not on file   History reviewed. No pertinent family history. No current facility-administered medications on file prior to encounter.   Current Outpatient Medications on File Prior to Encounter  Medication Sig Dispense Refill  . acetaminophen (TYLENOL) 325 MG tablet Take 2 tablets (650 mg total) by mouth every 6 (six) hours as needed for mild pain or headache (do not take over 3000 mg in a day of tylenol. please note there is tylenol in the fiorecet).    . Acetaminophen-Caffeine 500-65 MG TABS Take 1 tablet by mouth every 6 (six) hours as needed. 30 each 3  .  acetaZOLAMIDE (DIAMOX SEQUELS) 500 MG capsule Take 2 capsules (1,000 mg total) by mouth 2 (two) times daily. 120 capsule 3  . butalbital-acetaminophen-caffeine (FIORICET, ESGIC) 50-325-40 MG tablet Take 1 tablet by mouth every 8 (eight) hours as needed for headache. 14 tablet 0  . metoCLOPramide (REGLAN) 10 MG tablet Take 1 tablet (10 mg total) by mouth every 8 (eight) hours as needed for nausea or vomiting (headache). 30 tablet 0  . ondansetron (ZOFRAN) 4 MG tablet Take 1 tablet (4 mg total) by mouth every 8 (eight) hours as needed for nausea or vomiting. 20 tablet 3  . topiramate (TOPAMAX) 50 MG tablet TAKE 1 TABLET BY MOUTH TWICE A DAY 180 tablet 1  . warfarin (COUMADIN) 5 MG tablet Take 1 tablet (5 mg total) by mouth one time only at 6 PM. 60 tablet 2   No Known Allergies  I have reviewed patient's Past Medical Hx, Surgical  Hx, Family Hx, Social Hx, medications and allergies.   Review of Systems  Constitutional: Negative for appetite change, fatigue and fever.  Respiratory: Negative for cough and shortness of breath.   Gastrointestinal: Positive for abdominal distention and abdominal pain. Negative for constipation, diarrhea, nausea, rectal pain and vomiting.  Genitourinary: Negative for dysuria, flank pain, pelvic pain, vaginal bleeding, vaginal discharge and vaginal pain.  Musculoskeletal: Negative for back pain.  All other systems reviewed and are negative.   OBJECTIVE Patient Vitals for the past 24 hrs:  BP Temp Pulse Resp SpO2 Height Weight  03/22/20 0734 115/68 97.7 F (36.5 C) 98 20 99 % 5\' 3"  (1.6 m) 151 lb (68.5 kg)   Constitutional: Well-developed, well-nourished female in no acute distress.  Cardiovascular: normal rate & rhythm, no murmur Respiratory: normal rate and effort. Lung sounds clear throughout GI: Abd soft, guarding and tender when pressed along the large intestines but no rebound tenderness, Pos BS x 4 MS: Extremities nontender, no edema, normal ROM Neurologic: Alert and oriented x 4.  SPECULUM EXAM: NEFG, physiologic discharge, no blood noted, cervix clean BIMANUAL: No CMT, uterus normal size, no adnexal tenderness or masses.    LAB RESULTS Results for orders placed or performed during the hospital encounter of 03/22/20 (from the past 24 hour(s))  Pregnancy, urine POC     Status: Abnormal   Collection Time: 03/22/20  7:15 AM  Result Value Ref Range   Preg Test, Ur POSITIVE (A) NEGATIVE  Urinalysis, Routine w reflex microscopic Urine, Clean Catch     Status: None   Collection Time: 03/22/20  7:16 AM  Result Value Ref Range   Color, Urine YELLOW YELLOW   APPearance CLEAR CLEAR   Specific Gravity, Urine 1.011 1.005 - 1.030   pH 5.0 5.0 - 8.0   Glucose, UA NEGATIVE NEGATIVE mg/dL   Hgb urine dipstick NEGATIVE NEGATIVE   Bilirubin Urine NEGATIVE NEGATIVE   Ketones, ur NEGATIVE  NEGATIVE mg/dL   Protein, ur NEGATIVE NEGATIVE mg/dL   Nitrite NEGATIVE NEGATIVE   Leukocytes,Ua NEGATIVE NEGATIVE  CBC     Status: Abnormal   Collection Time: 03/22/20  7:56 AM  Result Value Ref Range   WBC 8.8 4.0 - 10.5 K/uL   RBC 4.50 3.87 - 5.11 MIL/uL   Hemoglobin 12.8 12.0 - 15.0 g/dL   HCT 16.139.6 36 - 46 %   MCV 88.0 80.0 - 100.0 fL   MCH 28.4 26.0 - 34.0 pg   MCHC 32.3 30.0 - 36.0 g/dL   RDW 09.613.5 04.511.5 - 40.915.5 %   Platelets  87 (L) 150 - 400 K/uL   nRBC 0.0 0.0 - 0.2 %  hCG, quantitative, pregnancy     Status: Abnormal   Collection Time: 03/22/20  7:56 AM  Result Value Ref Range   hCG, Beta Chain, Quant, S 3,061 (H) <5 mIU/mL  Wet prep, genital     Status: Abnormal   Collection Time: 03/22/20  8:21 AM   Specimen: Urine, Clean Catch  Result Value Ref Range   Yeast Wet Prep HPF POC NONE SEEN NONE SEEN   Trich, Wet Prep NONE SEEN NONE SEEN   Clue Cells Wet Prep HPF POC NONE SEEN NONE SEEN   WBC, Wet Prep HPF POC MANY (A) NONE SEEN   Sperm NONE SEEN     IMAGING US OB LESS THAN 14 WEEKS WITH OB TRANSVAGINAL  Result Date: 03/22/2020 CLINICAL DATA:  Abdominal pain affecting first-trimester pregnancy EXAM: OBSTETRIC <14 WK [place] AND TRANSVAGINAL OB US TECHNIQUE: Both transabdominal and transvaginal ultrasound examinations were performed for complete evaluation of the gestation as well as the maternal uterus, adnexal regions, and pelvic cul-de-sac. Transvaginal technique was performed to assess early pregnancy. COMPARISON:  None. FINDINGS: Intrauterine gestational sac: Likely present. There is eccentric sac-like structure within the endometrium. Yolk sac:  An early yolk sac is likely present. Embryo:  Not seen MSD: 3.1 mm   5 w   0 d Subchorionic hemorrhage:  None visualized. Maternal uterus/adnexae: Corpus luteum on the right. No extra ovarian adnexal mass or pelvic fluid. IMPRESSION: 1. Intrauterine sac with probable yolk sac, mean sac diameter measuring 5 weeks 0 days. 2. No unexpected  finding. Electronically Signed   By: Marnee Spring M.D.   On: 03/22/2020 09:13    MAU COURSE Orders Placed This Encounter  Procedures  . Wet prep, genital  . US OB LESS THAN 14 WEEKS WITH OB TRANSVAGINAL  . Urinalysis, Routine w reflex microscopic Urine, Clean Catch  . CBC  . hCG, quantitative, pregnancy  . Pregnancy, urine POC  . Discharge patient   Meds ordered this encounter  Medications  . simethicone (MYLICON) chewable tablet 80 mg    MDM Simethicone ordered - pain entirely relieved. Pt then stated she thinks the pain is from the chocolate milk she had yesterday. Discussed lactose intolerance and advised her to avoid lactose/dairy products.  ASSESSMENT 1. Gas pain   2. Abdominal pain affecting pregnancy   3. Dairy product intolerance   4. [redacted] weeks gestation of pregnancy     PLAN Discharge home in stable condition.  Follow-up Information    [facility]. Call.   Specialty: Obstetrics and Gynecology Why: to establish routine obstetrical care Contact information: [address] [place] [address] [phone]             Allergies as of 03/22/2020   No Known Allergies     Medication List    STOP taking these medications   Acetaminophen-Caffeine 500-65 MG Tabs   acetaZOLAMIDE 500 MG capsule Commonly known as: Diamox Sequels   butalbital-acetaminophen-caffeine 50-325-40 MG tablet Commonly known as: FIORICET   metoCLOPramide 10 MG tablet Commonly known as: REGLAN   ondansetron 4 MG tablet Commonly known as: Zofran   topiramate 50 MG tablet Commonly known as: TOPAMAX   warfarin 5 MG tablet Commonly known as: COUMADIN     TAKE these medications   acetaminophen 325 MG tablet Commonly known as: TYLENOL Take 2 tablets (650 mg total) by mouth every 6 (six)  hours as needed for mild pain or headache (do not take over 3000 mg in a day of tylenol. please note there is tylenol in the  fiorecet).       Edd Arbour, CNM, MSN, [facility] 03/22/20 10:17 AM

## 2020-03-22 NOTE — Discharge Instructions (Signed)
Lactose Intolerance, Adult Lactose is a natural sugar that is found in dairy milk and dairy products such as cheese and yogurt. Lactose is digested by lactase, a protein (enzyme) in your small intestine. Some people do not produce enough lactase to digest lactose. This is called lactose intolerance. Lactose intolerance is different from milk allergy, which is a more serious reaction to the protein in milk. What are the causes? Causes of lactose intolerance may include:  Normal aging. The ability to produce lactase may lessen with age, causing lactose intolerance over time.  Being born without the ability to make lactase.  Digestive diseases such as gastroenteritis or inflammatory bowel disease (IBD).  Surgery or injury to your small intestine.  Infection in your intestines.  Certain antibiotic medicines and cancer treatments. What are the signs or symptoms? Lactose intolerance can cause discomfort within 30 minutes to 2 hours after you eat or drink something that contains lactose. Symptoms may include:  Nausea.  Diarrhea.  Cramps or pain in the abdomen.  A full, tight, or painful feeling in the abdomen (bloating).  Gas. How is this diagnosed? This condition may be diagnosed based on:  Your symptoms and medical history.  Lactose tolerance test. This test involves drinking a lactose solution and then having blood tests to measure the amount of glucose in your blood. If your blood glucose level does not go up, it means your body is not able to digest the lactose.  Lactose breath test (hydrogen breath test). This test involves drinking a lactose solution and then exhaling into a type of bag while you digest the solution. Having a lot of hydrogen in your breath can be a sign of lactose intolerance.  Stool acidity test. This involves drinking a lactose solution and then having your stool samples tested for bacteria. Having a lot of bacteria causes stool to be considered acidic, which  is a sign of lactose intolerance. How is this treated? There is no treatment to improve your body's ability to produce lactase. However, you can manage your symptoms at home by:  Limiting or avoiding dairy milk, dairy products, and other sources of lactose.  Taking lactase tablets when you eat milk products. Lactase tablets are over-the-counter medicines that help to improve lactose digestion. You may also add lactase drops to regular milk.  Adjusting your diet, such as drinking lactose-free milk. Lactose tolerance varies from person to person. Some people may be able to eat or drink small amounts of products that contain lactose, and other people may need to avoid all foods and drinks that contain lactose. Talk with your health care provider about what treatment is best for you. Follow these instructions at home:  Limit or avoid foods, beverages, and medicines that contain lactose, as told by your health care provider. Keep track of which foods, beverages, or medicines cause symptoms so you can decide what to avoid in the future.  Read food and medicine labels carefully. Avoid products that contain: ? Lactose. ? Milk solids. ? Casein. ? Whey.  Take over-the-counter and prescription medicines (including lactase tablets) only as told by your health care provider.  If you stop eating and drinking dairy products (eliminate dairy from your diet), make sure to get enough protein, calcium, and vitamin D from other foods. Work with your health care provider or a diet and nutrition specialist (dietitian) to make sure you get enough of those nutrients.  Choose a milk substitute that is fortified with calcium and vitamin D. ? Soy milk   contains high-quality protein. ? Milks that are made from nuts or grains (such as almond milk and rice milk) contain very small amounts of protein.  Keep all follow-up visits as told by your health care provider. This is important. Contact a health care provider  if:  You have no relief from your symptoms after you have eliminated milk products and other sources of lactose. Get help right away if:  You have blood in your stool.  You have severe abdomen (abdominal) pain. Summary  Lactose is a natural sugar that is found in dairy milk and dairy products such as cheese and yogurt. Lactose is digested by lactase, which is a protein (enzyme) in the small intestine.  Some people do not produce enough lactase to digest lactose. This is called lactose intolerance.  Lactose intolerance can cause discomfort within 30 minutes to 2 hours after you eat or drink something that contains lactose.  Limit or avoid foods, beverages, and medicines that contain lactose, as told by your health care provider. This information is not intended to replace advice given to you by your health care provider. Make sure you discuss any questions you have with your health care provider. Document Revised: 05/31/2017 Document Reviewed: 02/01/2017 Elsevier Patient Education  2020 Elsevier Inc.  

## 2020-03-23 ENCOUNTER — Other Ambulatory Visit: Payer: Self-pay

## 2020-03-23 ENCOUNTER — Encounter (HOSPITAL_COMMUNITY): Payer: Self-pay | Admitting: Obstetrics and Gynecology

## 2020-03-23 ENCOUNTER — Inpatient Hospital Stay (HOSPITAL_COMMUNITY)
Admission: AD | Admit: 2020-03-23 | Discharge: 2020-03-23 | Disposition: A | Discharge status: Home / Self Care | Payer: 59 | Attending: Obstetrics and Gynecology | Admitting: Obstetrics and Gynecology

## 2020-03-23 DIAGNOSIS — O26891 Other specified pregnancy related conditions, first trimester: Secondary | ICD-10-CM | POA: Insufficient documentation

## 2020-03-23 DIAGNOSIS — R109 Unspecified abdominal pain: Secondary | ICD-10-CM | POA: Diagnosis not present

## 2020-03-23 DIAGNOSIS — O99891 Other specified diseases and conditions complicating pregnancy: Secondary | ICD-10-CM | POA: Diagnosis not present

## 2020-03-23 DIAGNOSIS — O9A211 Injury, poisoning and certain other consequences of external causes complicating pregnancy, first trimester: Secondary | ICD-10-CM | POA: Diagnosis not present

## 2020-03-23 DIAGNOSIS — Z3A01 Less than 8 weeks gestation of pregnancy: Secondary | ICD-10-CM

## 2020-03-23 DIAGNOSIS — T148XXA Other injury of unspecified body region, initial encounter: Secondary | ICD-10-CM

## 2020-03-23 LAB — URINALYSIS, ROUTINE W REFLEX MICROSCOPIC
Bilirubin Urine: NEGATIVE
Glucose, UA: NEGATIVE mg/dL
Hgb urine dipstick: NEGATIVE
Ketones, ur: NEGATIVE mg/dL
Leukocytes,Ua: NEGATIVE
Nitrite: NEGATIVE
Protein, ur: NEGATIVE mg/dL
Specific Gravity, Urine: 1.003 — ABNORMAL LOW (ref 1.005–1.030)
pH: 6 (ref 5.0–8.0)

## 2020-03-23 LAB — GC/CHLAMYDIA PROBE AMP (~~LOC~~) NOT AT ARMC
Chlamydia: NEGATIVE
Comment: NEGATIVE
Comment: NORMAL
Neisseria Gonorrhea: NEGATIVE

## 2020-03-23 MED ORDER — CYCLOBENZAPRINE HCL 10 MG PO TABS
10.0000 mg | ORAL_TABLET | Freq: Three times a day (TID) | ORAL | 0 refills | Status: DC | PRN
Start: 2020-03-23 — End: 2020-04-20

## 2020-03-23 NOTE — MAU Note (Signed)
Pt returns to MAU complaining of abdominal pain. Pt states that this pain is different from yesterday as it is on her left side and radiates to her back. Pt describes the pain as intermittent and "like someone is punching it." 10/10 pain score. This pain began today at 0700.  Pt denies vaginal bleeding or abnormal vaginal discharge.

## 2020-03-23 NOTE — Discharge Instructions (Signed)
You can take 1000mg  Tylenol every 8 hours. It is safe to take hot showers or use a warm cloth or towel for your pain.   [facility] [phone] Located at [address], [place], [place]

## 2020-03-23 NOTE — MAU Provider Note (Signed)
History     CSN: [id number]  Arrival date and time: 03/23/20 2101   None     Chief Complaint  Patient presents with  . Abdominal Pain  . Back Pain   HPI  Patient is a 29 y/o G1P0 at [redacted]w[redacted]d presenting for abdominal pain. Patient was seen yesterday for abdominal pain, ultimately felt that it was gas pain and she was given simethicone and counseled on avoiding dairy.   Reports today her pain is different. She was at work lifting items up to approximately 20lbs and started having left sided abdominal pain. She reports that her pain from yesterday resolved with simethicone. She is passing gas and had a BM yesterday. This pain is on her side and hurts with movement and walking. She wanted to make sure it was not a problem with baby. She reports she has been trying to establish prenatal care and has not had success in finding an office.    She had an [place] yesterday showing an intrauterine sac measuring to be approx 5wks, no abnormal findings. Normal CBC. Normal wet prep.   Denies vaginal bleeding or vaginal discharge. No abdominal pain, just side pain. No back pain. No dysuria. No SOB or chest pain.     History reviewed. No pertinent past medical history.  History reviewed. No pertinent surgical history.  History reviewed. No pertinent family history.  Social History   Tobacco Use  . Smoking status: Never Smoker  . Smokeless tobacco: Never Used  Vaping Use  . Vaping Use: Never used  Substance Use Topics  . Alcohol use: No    Alcohol/week: 0.0 standard drinks  . Drug use: No    Allergies: No Known Allergies  Medications Prior to Admission  Medication Sig Dispense Refill Last Dose  . acetaminophen (TYLENOL) 325 MG tablet Take 2 tablets (650 mg total) by mouth every 6 (six) hours as needed for mild pain or headache (do not take over 3000 mg in a day of tylenol. please note there is tylenol in the fiorecet).   Past Week at Unknown time    Review of Systems  Constitutional:  Negative for activity change and appetite change.  Respiratory: Negative for chest tightness.   Cardiovascular: Negative for chest pain.  Gastrointestinal: Negative for abdominal distention, abdominal pain, diarrhea, nausea and vomiting.  Genitourinary: Negative for dysuria and flank pain.  Neurological: Negative for dizziness.   Physical Exam   Last menstrual period 02/14/2020.  Physical Exam Vitals and nursing note reviewed.  Constitutional:      Appearance: She is well-developed.  Cardiovascular:     Rate and Rhythm: Normal rate and regular rhythm.  Abdominal:     General: Bowel sounds are normal.     Palpations: Abdomen is soft.     Tenderness: There is no abdominal tenderness.     Comments: BS are soft, non tender and non distended. There is no pain with palpation of abdomen in all four quadrants.   Tender to palpation of left oblique muscles. It does not radiate. Pain is elicited with side flexion to the right and rotation to the right. There is no CVA tenderness. No vertebral tenderness.   Neurological:     Mental Status: She is alert.     MAU Course  Procedures  MDM Patient assessed at bedside Physical exam performed as above, suspect musculoskeletal etiology Discussed w patient and partner, stable for d/c from MAU    Assessment and Plan   Left sided oblique muscular strain  -  discussed with patient. Provided with at home stretches -encouraged heat, tylenol, rest. Script sent for flexeril prn, counseled on using at night time 2/2 sedation  -provided w contact information for [facility] office and message sent to office to see if availability for establishing care -stable for d/c from MAU    Gita Kudo 03/23/2020, 10:24 PM

## 2020-04-19 ENCOUNTER — Other Ambulatory Visit: Payer: Self-pay

## 2020-04-19 ENCOUNTER — Encounter (HOSPITAL_COMMUNITY): Payer: Self-pay | Admitting: Obstetrics and Gynecology

## 2020-04-19 ENCOUNTER — Inpatient Hospital Stay (HOSPITAL_COMMUNITY)
Admission: AD | Admit: 2020-04-19 | Discharge: 2020-04-19 | Disposition: A | Discharge status: Home / Self Care | Payer: 59 | Attending: Obstetrics and Gynecology | Admitting: Obstetrics and Gynecology

## 2020-04-19 ENCOUNTER — Inpatient Hospital Stay (HOSPITAL_COMMUNITY): Payer: 59

## 2020-04-19 DIAGNOSIS — R109 Unspecified abdominal pain: Secondary | ICD-10-CM

## 2020-04-19 DIAGNOSIS — O99891 Other specified diseases and conditions complicating pregnancy: Secondary | ICD-10-CM

## 2020-04-19 DIAGNOSIS — R140 Abdominal distension (gaseous): Secondary | ICD-10-CM | POA: Insufficient documentation

## 2020-04-19 DIAGNOSIS — O26891 Other specified pregnancy related conditions, first trimester: Secondary | ICD-10-CM | POA: Diagnosis present

## 2020-04-19 DIAGNOSIS — R1084 Generalized abdominal pain: Secondary | ICD-10-CM | POA: Diagnosis not present

## 2020-04-19 DIAGNOSIS — O219 Vomiting of pregnancy, unspecified: Secondary | ICD-10-CM | POA: Insufficient documentation

## 2020-04-19 DIAGNOSIS — Z3A09 9 weeks gestation of pregnancy: Secondary | ICD-10-CM | POA: Diagnosis not present

## 2020-04-19 DIAGNOSIS — O26899 Other specified pregnancy related conditions, unspecified trimester: Secondary | ICD-10-CM

## 2020-04-19 DIAGNOSIS — M545 Low back pain, unspecified: Secondary | ICD-10-CM | POA: Insufficient documentation

## 2020-04-19 DIAGNOSIS — M549 Dorsalgia, unspecified: Secondary | ICD-10-CM

## 2020-04-19 DIAGNOSIS — R141 Gas pain: Secondary | ICD-10-CM | POA: Diagnosis not present

## 2020-04-19 LAB — URINALYSIS, ROUTINE W REFLEX MICROSCOPIC
Bilirubin Urine: NEGATIVE
Glucose, UA: NEGATIVE mg/dL
Ketones, ur: NEGATIVE mg/dL
Leukocytes,Ua: NEGATIVE
Nitrite: NEGATIVE
Protein, ur: NEGATIVE mg/dL
Specific Gravity, Urine: 1.004 — ABNORMAL LOW (ref 1.005–1.030)
pH: 6 (ref 5.0–8.0)

## 2020-04-19 LAB — LIPASE, BLOOD: Lipase: 35 U/L (ref 11–51)

## 2020-04-19 LAB — AMYLASE: Amylase: 102 U/L — ABNORMAL HIGH (ref 28–100)

## 2020-04-19 MED ORDER — PRENATAL VITAMIN 27-0.8 MG PO TABS: 1.0000 | ORAL_TABLET | Freq: Every day | ORAL | 12 refills | Status: DC

## 2020-04-19 MED ORDER — PROMETHAZINE HCL 12.5 MG PO TABS: 12.5000 mg | ORAL_TABLET | Freq: Four times a day (QID) | ORAL | 0 refills | Status: DC | PRN

## 2020-04-19 MED ORDER — SIMETHICONE 80 MG PO CHEW
80.0000 mg | CHEWABLE_TABLET | Freq: Once | ORAL | Status: AC
  Administered 2020-04-19: 80 mg via ORAL
  Filled 2020-04-19: qty 1

## 2020-04-19 MED ORDER — HYOSCYAMINE SULFATE 0.125 MG SL SUBL
0.1250 mg | SUBLINGUAL_TABLET | Freq: Once | SUBLINGUAL | Status: AC
  Administered 2020-04-19: 0.125 mg via SUBLINGUAL
  Filled 2020-04-19: qty 1

## 2020-04-19 MED ORDER — ACETAMINOPHEN 325 MG PO TABS
650.0000 mg | ORAL_TABLET | Freq: Once | ORAL | Status: AC
  Administered 2020-04-19: 650 mg via ORAL
  Filled 2020-04-19: qty 2

## 2020-04-19 MED ORDER — SIMETHICONE 80 MG PO CHEW: 80.0000 mg | CHEWABLE_TABLET | Freq: Four times a day (QID) | ORAL | 0 refills | Status: DC | PRN

## 2020-04-19 NOTE — MAU Note (Signed)
Pt reports lower abd pain, lower back pain since yesterday. Vomited x 2 yesterday. Denies dysuria, denies vaginal bleeding.

## 2020-04-19 NOTE — MAU Provider Note (Signed)
Chief Complaint: Abdominal Pain and Back Pain   First Provider Initiated Contact with Patient 04/19/20 2002        SUBJECTIVE HPI: Andrea Hayes is a 29 y.o. G1P0 at [redacted]w[redacted]d by LMP who presents to maternity admissions reporting generalized abdominal pain since yesterday.  States she thinks it is related to chicken and fries she ate yesterday.  Also has back pain.  Was seen here 03/22/20 and US showed IUGS with questionable Yolk sac (5 weeks). She denies vaginal bleeding, vaginal itching/burning, urinary symptoms, h/a, dizziness, n/v, or fever/chills.    Abdominal Pain This is a new problem. The current episode started yesterday. The onset quality is gradual. The problem occurs constantly. The problem has been unchanged. The pain is located in the generalized abdominal region. The quality of the pain is cramping and colicky. The abdominal pain radiates to the back. Associated symptoms include nausea and vomiting (twice yesterday). Pertinent negatives include no constipation, diarrhea, dysuria, fever, frequency, headaches or myalgias. The pain is aggravated by palpation. The pain is relieved by nothing. She has tried nothing for the symptoms.  Back Pain This is a new problem. The current episode started yesterday. The problem occurs constantly. The problem is unchanged. The quality of the pain is described as aching. Stiffness is present all day. Associated symptoms include abdominal pain. Pertinent negatives include no dysuria, fever or headaches. She has tried nothing for the symptoms.   RN Note: Pt reports lower abd pain, lower back pain since yesterday. Vomited x 2 yesterday. Denies dysuria, denies vaginal bleeding.   Past Medical History:  Diagnosis Date  . Medical history non-contributory    Past Surgical History:  Procedure Laterality Date  . NO PAST SURGERIES     Social History   Socioeconomic History  . Marital status: Single    Spouse name: Not on file  . Number of children: Not on  file  . Years of education: Not on file  . Highest education level: Not on file  Occupational History  . Not on file  Tobacco Use  . Smoking status: Never Smoker  . Smokeless tobacco: Never Used  Vaping Use  . Vaping Use: Never used  Substance and Sexual Activity  . Alcohol use: No    Alcohol/week: 0.0 standard drinks  . Drug use: No  . Sexual activity: Yes    Birth control/protection: None  Other Topics Concern  . Not on file  Social History Narrative  . Not on file   Social Determinants of Health   Financial Resource Strain:   . Difficulty of Paying Living Expenses: Not on file  Food Insecurity:   . Worried About Programme researcher, broadcasting/film/video in the Last Year: Not on file  . Ran Out of Food in the Last Year: Not on file  Transportation Needs:   . Lack of Transportation (Medical): Not on file  . Lack of Transportation (Non-Medical): Not on file  Physical Activity:   . Days of Exercise per Week: Not on file  . Minutes of Exercise per Session: Not on file  Stress:   . Feeling of Stress : Not on file  Social Connections:   . Frequency of Communication with Friends and Family: Not on file  . Frequency of Social Gatherings with Friends and Family: Not on file  . Attends Religious Services: Not on file  . Active Member of Clubs or Organizations: Not on file  . Attends Banker Meetings: Not on file  . Marital Status: Not  on file  Intimate Partner Violence:   . Fear of Current or Ex-Partner: Not on file  . Emotionally Abused: Not on file  . Physically Abused: Not on file  . Sexually Abused: Not on file   No current facility-administered medications on file prior to encounter.   Current Outpatient Medications on File Prior to Encounter  Medication Sig Dispense Refill  . acetaminophen (TYLENOL) 325 MG tablet Take 2 tablets (650 mg total) by mouth every 6 (six) hours as needed for mild pain or headache (do not take over 3000 mg in a day of tylenol. please note there is  tylenol in the fiorecet).    . cyclobenzaprine (FLEXERIL) 10 MG tablet Take 1 tablet (10 mg total) by mouth 3 (three) times daily as needed for muscle spasms. 30 tablet 0   No Known Allergies  I have reviewed patient's Past Medical Hx, Surgical Hx, Family Hx, Social Hx, medications and allergies.   ROS:  Review of Systems  Constitutional: Negative for fever.  Gastrointestinal: Positive for abdominal pain, nausea and vomiting (twice yesterday). Negative for constipation and diarrhea.  Genitourinary: Negative for dysuria and frequency.  Musculoskeletal: Positive for back pain. Negative for myalgias.  Neurological: Negative for headaches.   Review of Systems  Other systems negative   Physical Exam  Physical Exam Patient Vitals for the past 24 hrs:  BP Temp Pulse Resp SpO2 Height Weight  04/19/20 1924 120/83 98.6 F (37 C) 88 17 100 % 5\' 3"  (1.6 m) 70.8 kg   Constitutional: Well-developed, well-nourished female in no acute distress.  Cardiovascular: normal rate Respiratory: normal effort GI: Abd soft but distended (c/w gas), with generalized tenderness. Pos BS x 4 MS: Extremities nontender, no edema, normal ROM Neurologic: Alert and oriented x 4.  GU: Neg CVAT.  PELVIC EXAM: deferred due to planning vaginal ultrasound  LAB RESULTS Results for orders placed or performed during the hospital encounter of 04/19/20 (from the past 24 hour(s))  Urinalysis, Routine w reflex microscopic Urine, Clean Catch     Status: Abnormal   Collection Time: 04/19/20  7:42 PM  Result Value Ref Range   Color, Urine STRAW (A) YELLOW   APPearance CLEAR CLEAR   Specific Gravity, Urine 1.004 (L) 1.005 - 1.030   pH 6.0 5.0 - 8.0   Glucose, UA NEGATIVE NEGATIVE mg/dL   Hgb urine dipstick MODERATE (A) NEGATIVE   Bilirubin Urine NEGATIVE NEGATIVE   Ketones, ur NEGATIVE NEGATIVE mg/dL   Protein, ur NEGATIVE NEGATIVE mg/dL   Nitrite NEGATIVE NEGATIVE   Leukocytes,Ua NEGATIVE NEGATIVE   RBC / HPF 0-5 0  - 5 RBC/hpf   WBC, UA 0-5 0 - 5 WBC/hpf   Bacteria, UA RARE (A) NONE SEEN   Squamous Epithelial / LPF 0-5 0 - 5  CBC     Status: Abnormal   Collection Time: 04/19/20  8:42 PM  Result Value Ref Range   WBC 10.2 4.0 - 10.5 K/uL   RBC 4.62 3.87 - 5.11 MIL/uL   Hemoglobin 13.1 12.0 - 15.0 g/dL   HCT 04/21/20 36 - 46 %   MCV 86.8 80.0 - 100.0 fL   MCH 28.4 26.0 - 34.0 pg   MCHC 32.7 30.0 - 36.0 g/dL   RDW 24.2 35.3 - 61.4 %   Platelets 110 (L) 150 - 400 K/uL   nRBC 0.0 0.0 - 0.2 %  Comprehensive metabolic panel     Status: Abnormal   Collection Time: 04/19/20  8:42 PM  Result Value Ref  Range   Sodium 130 (L) 135 - 145 mmol/L   Potassium 3.8 3.5 - 5.1 mmol/L   Chloride 99 98 - 111 mmol/L   CO2 21 (L) 22 - 32 mmol/L   Glucose, Bld 87 70 - 99 mg/dL   BUN 5 (L) 6 - 20 mg/dL   Creatinine, Ser 6.960.63 0.44 - 1.00 mg/dL   Calcium 9.1 8.9 - 29.510.3 mg/dL   Total Protein 7.4 6.5 - 8.1 g/dL   Albumin 3.4 (L) 3.5 - 5.0 g/dL   AST 20 15 - 41 U/L   ALT 17 0 - 44 U/L   Alkaline Phosphatase 64 38 - 126 U/L   Total Bilirubin 0.4 0.3 - 1.2 mg/dL   GFR, Estimated >28>60 >41>60 mL/min   Anion gap 10 5 - 15  Amylase     Status: Abnormal   Collection Time: 04/19/20  8:42 PM  Result Value Ref Range   Amylase 102 (H) 28 - 100 U/L  Lipase, blood     Status: None   Collection Time: 04/19/20  8:42 PM  Result Value Ref Range   Lipase 35 11 - 51 U/L     IMAGING US OB Comp Less 14 Wks  Result Date: 04/19/2020 CLINICAL DATA:  Initial evaluation for acute lower abdominal pain for 2 days, early pregnancy. EXAM: OBSTETRIC <14 WK ULTRASOUND TECHNIQUE: Transabdominal ultrasound was performed for evaluation of the gestation as well as the maternal uterus and adnexal regions. COMPARISON:  Prior ultrasound from 03/22/2020. FINDINGS: Intrauterine gestational sac: Single Yolk sac:  Present Embryo:  Present Cardiac Activity: Present Heart Rate: 160 bpm CRL: 20.3 mm   8 w 4 d                  US EDC: 11/25/2020 Subchorionic  hemorrhage:  None visualized. Maternal uterus/adnexae: Ovaries are normal in appearance bilaterally. No adnexal mass or free fluid. IMPRESSION: 1. Single viable intrauterine pregnancy as above, estimated gestational age [redacted] weeks and 4 days by crown-rump length, with ultrasound EDC of 11/25/2020. No complication. 2. No other acute maternal uterine or adnexal abnormality identified. Electronically Signed   By: Rise MuBenjamin  McClintock M.D.   On: 04/19/2020 21:15   US OB LESS THAN 14 WEEKS WITH OB TRANSVAGINAL  Result Date: 03/22/2020 CLINICAL DATA:  Abdominal pain affecting first-trimester pregnancy EXAM: OBSTETRIC <14 WK US AND TRANSVAGINAL OB US TECHNIQUE: Both transabdominal and transvaginal ultrasound examinations were performed for complete evaluation of the gestation as well as the maternal uterus, adnexal regions, and pelvic cul-de-sac. Transvaginal technique was performed to assess early pregnancy. COMPARISON:  None. FINDINGS: Intrauterine gestational sac: Likely present. There is eccentric sac-like structure within the endometrium. Yolk sac:  An early yolk sac is likely present. Embryo:  Not seen MSD: 3.1 mm   5 w   0 d Subchorionic hemorrhage:  None visualized. Maternal uterus/adnexae: Corpus luteum on the right. No extra ovarian adnexal mass or pelvic fluid. IMPRESSION: 1. Intrauterine sac with probable yolk sac, mean sac diameter measuring 5 weeks 0 days. 2. No unexpected finding. Electronically Signed   By: Marnee SpringJonathon  Watts M.D.   On: 03/22/2020 09:13     MAU Management/MDM: Ordered followup transvaginal Ultrasound to rule out ectopic.US showed live single IUP  Reviewed these findings with patient and her spouse  Exam was consistent with abdominal gas and bloating We gave her Levsin and Simethicone with good relief Tylenol given for back pain, helped somewhat  ASSESSMENT Single IUP at 4965w3d by LMP, 582w4d by [place]  Abdominal pain, due to gas and bloating Low back pain, likely  strain/spasm  PLAN Discharge home Rx sent for prenatal vitamin Rx Promethazine for nausea Rx for simethicone for bloating  Pt stable at time of discharge. Encouraged to return here or to other Urgent Care/ED if she develops worsening of symptoms, increase in pain, fever, or other concerning symptoms.    Wynelle Bourgeois CNM, MSN Certified Nurse-Midwife 04/19/2020  8:02 PM

## 2020-04-19 NOTE — Discharge Instructions (Signed)
Abdominal Bloating When you have abdominal bloating, your abdomen may feel full, tight, or painful. It may also look bigger than normal or swollen (distended). Common causes of abdominal bloating include:  Swallowing air.  Constipation.  Problems digesting food.  Eating too much.  Irritable bowel syndrome. This is a condition that affects the large intestine.  Lactose intolerance. This is an inability to digest lactose, a natural sugar in dairy products.  Celiac disease. This is a condition that affects the ability to digest gluten, a protein found in some grains.  Gastroparesis. This is a condition that slows down the movement of food in the stomach and small intestine. It is more common in people with diabetes mellitus.  Gastroesophageal reflux disease (GERD). This is a digestive condition that makes stomach acid flow back into the esophagus.  Urinary retention. This means that the body is holding onto urine, and the bladder cannot be emptied all the way. Follow these instructions at home: Eating and drinking  Avoid eating too much.  Try not to swallow air while talking or eating.  Avoid eating while lying down.  Avoid these foods and drinks: ? Foods that cause gas, such as broccoli, cabbage, cauliflower, and baked beans. ? Carbonated drinks. ? Hard candy. ? Chewing gum. Medicines  Take over-the-counter and prescription medicines only as told by your health care provider.  Take probiotic medicines. These medicines contain live bacteria or yeasts that can help digestion.  Take coated peppermint oil capsules. Activity  Try to exercise regularly. Exercise may help to relieve bloating that is caused by gas and relieve constipation. General instructions  Keep all follow-up visits as told by your health care provider. This is important. Contact a health care provider if:  You have nausea and vomiting.  You have diarrhea.  You have abdominal pain.  You have unusual  weight loss or weight gain.  You have severe pain, and medicines do not help. Get help right away if:  You have severe chest pain.  You have trouble breathing.  You have shortness of breath.  You have trouble urinating.  You have darker urine than normal.  You have blood in your stools or have dark, tarry stools. Summary  Abdominal bloating means that the abdomen is swollen.  Common causes of abdominal bloating are swallowing air, constipation, and problems digesting food.  Avoid eating too much and avoid swallowing air.  Avoid foods that cause gas, carbonated drinks, hard candy, and chewing gum. This information is not intended to replace advice given to you by your health care provider. Make sure you discuss any questions you have with your health care provider. Document Revised: 10/06/2018 Document Reviewed: 07/20/2016 Elsevier Patient Education  2020 Elsevier Inc.  

## 2020-04-20 ENCOUNTER — Other Ambulatory Visit: Payer: Self-pay | Admitting: Obstetrics and Gynecology

## 2020-04-28 ENCOUNTER — Ambulatory Visit: Payer: 59

## 2020-04-28 DIAGNOSIS — Z3400 Encounter for supervision of normal first pregnancy, unspecified trimester: Secondary | ICD-10-CM

## 2020-04-28 DIAGNOSIS — O0990 Supervision of high risk pregnancy, unspecified, unspecified trimester: Secondary | ICD-10-CM | POA: Insufficient documentation

## 2020-04-28 MED ORDER — BLOOD PRESSURE KIT DEVI: 1.0000 | 0 refills | Status: DC | PRN

## 2020-04-28 NOTE — Progress Notes (Signed)
L..  Virtual Visit via Telephone Note  I connected with Andrea Hayes on 04/28/20 at  2:00 PM EDT by telephone and verified that I am speaking with the correct person using two identifiers.  Location:[facility] Patient: Andrea Hayes Provider: Nurse   I discussed the limitations, risks, security and privacy concerns of performing an evaluation and management service by telephone and the availability of in person appointments. I also discussed with the patient that there may be a patient responsible charge related to this service. The patient expressed understanding and agreed to proceed.   History of Present Illness: PRENATAL INTAKE SUMMARY  Andrea Hayes presents today New OB Nurse Interview.  OB History    Gravida  1   Para      Term      Preterm      AB      Living        SAB      TAB      Ectopic      Multiple      Live Births             I have reviewed the patient's medical, obstetrical, social, and family histories, medications, and available lab results.  SUBJECTIVE She complains of headache and states that she has not tried any medication for it. Pt denies hx of pre-existing conditions   Observations/Objective: Initial nurse interview for history/labs (New OB)  EDD: 11-20-20 GA: [redacted]w[redacted]d GP: G1P0  GENERAL APPEARANCE: alert, well appearing  Assessment and Plan: Normal pregnancy Advised pt to try Tylenol for headache and let us know if it does not help, pt agreed Labs to be completed at next visit with on 05-05-20 BP cuff sent to [facility] Babyscripts ordered PHQ-9= 0   Follow Up Instructions:   I discussed the assessment and treatment plan with the patient. The patient was provided an opportunity to ask questions and all were answered. The patient agreed with the plan and demonstrated an understanding of the instructions.   The patient was advised to call back or seek an in-person evaluation if the symptoms worsen or if the condition fails to  improve as anticipated.  I provided 15 minutes of non-face-to-face time during this encounter.   Katrina Stack, RN

## 2020-04-28 NOTE — Progress Notes (Signed)
Patient was assessed and managed by nursing staff during this encounter. I have reviewed the chart and agree with the documentation and plan. I have also made any necessary editorial changes.  Warden Fillers, MD 04/28/2020 2:56 PM

## 2020-05-05 ENCOUNTER — Other Ambulatory Visit (HOSPITAL_COMMUNITY)
Admission: RE | Admit: 2020-05-05 | Discharge: 2020-05-05 | Disposition: A | Discharge status: Home / Self Care | Payer: 59 | Source: Ambulatory Visit | Attending: Obstetrics and Gynecology | Admitting: Obstetrics and Gynecology

## 2020-05-05 ENCOUNTER — Ambulatory Visit (INDEPENDENT_AMBULATORY_CARE_PROVIDER_SITE_OTHER): Payer: 59 | Admitting: Obstetrics and Gynecology

## 2020-05-05 ENCOUNTER — Other Ambulatory Visit: Payer: Self-pay

## 2020-05-05 ENCOUNTER — Encounter: Payer: 59 | Admitting: Obstetrics and Gynecology

## 2020-05-05 VITALS — BP 113/78 | HR 91 | Wt 154.0 lb

## 2020-05-05 DIAGNOSIS — I63511 Cerebral infarction due to unspecified occlusion or stenosis of right middle cerebral artery: Secondary | ICD-10-CM

## 2020-05-05 DIAGNOSIS — R519 Headache, unspecified: Secondary | ICD-10-CM

## 2020-05-05 DIAGNOSIS — Z3400 Encounter for supervision of normal first pregnancy, unspecified trimester: Secondary | ICD-10-CM | POA: Diagnosis present

## 2020-05-05 DIAGNOSIS — O26891 Other specified pregnancy related conditions, first trimester: Secondary | ICD-10-CM

## 2020-05-05 DIAGNOSIS — Z3A11 11 weeks gestation of pregnancy: Secondary | ICD-10-CM

## 2020-05-05 DIAGNOSIS — G08 Intracranial and intraspinal phlebitis and thrombophlebitis: Secondary | ICD-10-CM

## 2020-05-05 DIAGNOSIS — G932 Benign intracranial hypertension: Secondary | ICD-10-CM

## 2020-05-05 MED ORDER — BUTALBITAL-APAP-CAFFEINE 50-325-40 MG PO CAPS: 1.0000 | ORAL_CAPSULE | Freq: Four times a day (QID) | ORAL | 0 refills | Status: DC | PRN

## 2020-05-05 NOTE — Patient Instructions (Signed)
First Trimester of Pregnancy  The first trimester of pregnancy is from week 1 until the end of week 13 (months 1 through 3). During this time, your baby will begin to develop inside you. At 6-8 weeks, the eyes and face are formed, and the heartbeat can be seen on ultrasound. At the end of 12 weeks, all the baby's organs are formed. Prenatal care is all the medical care you receive before the birth of your baby. Make sure you get good prenatal care and follow all of your doctor's instructions. Follow these instructions at home: Medicines  Take over-the-counter and prescription medicines only as told by your doctor. Some medicines are safe and some medicines are not safe during pregnancy.  Take a prenatal vitamin that contains at least 600 micrograms (mcg) of folic acid.  If you have trouble pooping (constipation), take medicine that will make your stool soft (stool softener) if your doctor approves. Eating and drinking   Eat regular, healthy meals.  Your doctor will tell you the amount of weight gain that is right for you.  Avoid raw meat and uncooked cheese.  If you feel sick to your stomach (nauseous) or throw up (vomit): ? Eat 4 or 5 small meals a day instead of 3 large meals. ? Try eating a few soda crackers. ? Drink liquids between meals instead of during meals.  To prevent constipation: ? Eat foods that are high in fiber, like fresh fruits and vegetables, whole grains, and beans. ? Drink enough fluids to keep your pee (urine) clear or pale yellow. Activity  Exercise only as told by your doctor. Stop exercising if you have cramps or pain in your lower belly (abdomen) or low back.  Do not exercise if it is too hot, too humid, or if you are in a place of great height (high altitude).  Try to avoid standing for long periods of time. Move your legs often if you must stand in one place for a long time.  Avoid heavy lifting.  Wear low-heeled shoes. Sit and stand up  straight.  You can have sex unless your doctor tells you not to. Relieving pain and discomfort  Wear a good support bra if your breasts are sore.  Take warm water baths (sitz baths) to soothe pain or discomfort caused by hemorrhoids. Use hemorrhoid cream if your doctor says it is okay.  Rest with your legs raised if you have leg cramps or low back pain.  If you have puffy, bulging veins (varicose veins) in your legs: ? Wear support hose or compression stockings as told by your doctor. ? Raise (elevate) your feet for 15 minutes, 3-4 times a day. ? Limit salt in your food. Prenatal care  Schedule your prenatal visits by the twelfth week of pregnancy.  Write down your questions. Take them to your prenatal visits.  Keep all your prenatal visits as told by your doctor. This is important. Safety  Wear your seat belt at all times when driving.  Make a list of emergency phone numbers. The list should include numbers for family, friends, the hospital, and police and fire departments. General instructions  Ask your doctor for a referral to a local prenatal class. Begin classes no later than at the start of month 6 of your pregnancy.  Ask for help if you need counseling or if you need help with nutrition. Your doctor can give you advice or tell you where to go for help.  Do not use hot tubs, steam   rooms, or saunas.  Do not douche or use tampons or scented sanitary pads.  Do not cross your legs for long periods of time.  Avoid all herbs and alcohol. Avoid drugs that are not approved by your doctor.  Do not use any tobacco products, including cigarettes, chewing tobacco, and electronic cigarettes. If you need help quitting, ask your doctor. You may get counseling or other support to help you quit.  Avoid cat litter boxes and soil used by cats. These carry germs that can cause birth defects in the baby and can cause a loss of your baby (miscarriage) or stillbirth.  Visit your dentist.  At home, brush your teeth with a soft toothbrush. Be gentle when you floss. Contact a doctor if:  You are dizzy.  You have mild cramps or pressure in your lower belly.  You have a nagging pain in your belly area.  You continue to feel sick to your stomach, you throw up, or you have watery poop (diarrhea).  You have a bad smelling fluid coming from your vagina.  You have pain when you pee (urinate).  You have increased puffiness (swelling) in your face, hands, legs, or ankles. Get help right away if:  You have a fever.  You are leaking fluid from your vagina.  You have spotting or bleeding from your vagina.  You have very bad belly cramping or pain.  You gain or lose weight rapidly.  You throw up blood. It may look like coffee grounds.  You are around people who have German measles, fifth disease, or chickenpox.  You have a very bad headache.  You have shortness of breath.  You have any kind of trauma, such as from a fall or a car accident. Summary  The first trimester of pregnancy is from week 1 until the end of week 13 (months 1 through 3).  To take care of yourself and your unborn baby, you will need to eat healthy meals, take medicines only if your doctor tells you to do so, and do activities that are safe for you and your baby.  Keep all follow-up visits as told by your doctor. This is important as your doctor will have to ensure that your baby is healthy and growing well. This information is not intended to replace advice given to you by your health care provider. Make sure you discuss any questions you have with your health care provider. Document Revised: 10/09/2018 Document Reviewed: 06/26/2016 Elsevier Patient Education  2020 Elsevier Inc.  

## 2020-05-05 NOTE — Progress Notes (Signed)
INITIAL PRENATAL VISIT NOTE  Subjective:  Andrea Hayes is a 29 y.o. G1P0 at 35w4dby LMP c/w 8.4 week u/s being seen today for her initial prenatal visit. This is a unplanned pregnancy.  She was using nothing for birth control previously.  She has a medical history significant for intracranial hypertension and cerebral venous thrombosis.  Pt is a poor historian and seems somewhat confused.  Chart review showed she had an incident 07/2018 with the cerebral thrombosis.  The patient was on lovenox and coumadin 6-12 months after the event.  Current med list does not show any anticoagulants.  Patient reports headache.  Contractions: Not present. Vag. Bleeding: None.  Movement: Present. Denies leaking of fluid.    Past Medical History:  Diagnosis Date  . Medical history non-contributory     Past Surgical History:  Procedure Laterality Date  . NO PAST SURGERIES      OB History  Gravida Para Term Preterm AB Living  1            SAB TAB Ectopic Multiple Live Births               # Outcome Date GA Lbr Len/2nd Weight Sex Delivery Anes PTL Lv  1 Current             Social History   Socioeconomic History  . Marital status: Single    Spouse name: Not on file  . Number of children: Not on file  . Years of education: Not on file  . Highest education level: Not on file  Occupational History  . Not on file  Tobacco Use  . Smoking status: Never Smoker  . Smokeless tobacco: Never Used  Vaping Use  . Vaping Use: Never used  Substance and Sexual Activity  . Alcohol use: No    Alcohol/week: 0.0 standard drinks  . Drug use: No  . Sexual activity: Yes    Birth control/protection: None  Other Topics Concern  . Not on file  Social History Narrative  . Not on file   Social Determinants of Health   Financial Resource Strain:   . Difficulty of Paying Living Expenses: Not on file  Food Insecurity:   . Worried About RCharity fundraiserin the Last Year: Not on file  . Ran Out of Food  in the Last Year: Not on file  Transportation Needs:   . Lack of Transportation (Medical): Not on file  . Lack of Transportation (Non-Medical): Not on file  Physical Activity:   . Days of Exercise per Week: Not on file  . Minutes of Exercise per Session: Not on file  Stress:   . Feeling of Stress : Not on file  Social Connections:   . Frequency of Communication with Friends and Family: Not on file  . Frequency of Social Gatherings with Friends and Family: Not on file  . Attends Religious Services: Not on file  . Active Member of Clubs or Organizations: Not on file  . Attends CArchivistMeetings: Not on file  . Marital Status: Not on file    No family history on file.   Current Outpatient Medications:  .  acetaminophen (TYLENOL) 325 MG tablet, Take 2 tablets (650 mg total) by mouth every 6 (six) hours as needed for mild pain or headache (do not take over 3000 mg in a day of tylenol. please note there is tylenol in the fiorecet)., Disp: , Rfl:  .  Blood Pressure Monitoring (BLOOD PRESSURE  KIT) DEVI, 1 kit by Does not apply route as needed., Disp: 1 each, Rfl: 0 .  cyclobenzaprine (FLEXERIL) 10 MG tablet, TAKE 1 TABLET BY MOUTH 3 TIMES DAILY AS NEEDED FOR MUSCLE SPASMS, Disp: 30 tablet, Rfl: 0 .  Prenatal Vit-Fe Fumarate-FA (PRENATAL VITAMIN) 27-0.8 MG TABS, Take 1 tablet by mouth daily., Disp: 30 tablet, Rfl: 12 .  promethazine (PHENERGAN) 12.5 MG tablet, Take 1 tablet (12.5 mg total) by mouth every 6 (six) hours as needed for nausea or vomiting. (Patient not taking: Reported on 05/05/2020), Disp: 30 tablet, Rfl: 0 .  simethicone (GAS-X) 80 MG chewable tablet, Chew 1 tablet (80 mg total) by mouth every 6 (six) hours as needed for flatulence. (Patient not taking: Reported on 05/05/2020), Disp: 30 tablet, Rfl: 0  No Known Allergies  Review of Systems: Negative except for what is mentioned in HPI.  Objective:   Vitals:   05/05/20 1315  BP: 113/78  Pulse: 91  Weight: 154 lb  (69.9 kg)    Fetal Status: Fetal Heart Rate (bpm): 132   Movement: Present     Physical Exam: BP 113/78   Pulse 91   Wt 154 lb (69.9 kg)   LMP 02/14/2020   BMI 27.28 kg/m  CONSTITUTIONAL: Well-developed, well-nourished female in no acute distress.  NEUROLOGIC: Alert and oriented to person, place, and time. Normal  muscle tone coordination. No cranial nerve deficit noted. PSYCHIATRIC: Normal mood and affect. Normal behavior. Normal judgment and thought content.  Thought processes appeared somewhat slowed. SKIN: Skin is warm and dry. No rash noted. Not diaphoretic. No erythema. No pallor. HENT:  Normocephalic, atraumatic, External right and left ear normal. Oropharynx is clear and moist EYES: Conjunctivae and EOM are normal.   NECK: Normal range of motion, supple, no masses CARDIOVASCULAR: Normal heart rate noted, regular rhythm RESPIRATORY: Effort and breath sounds normal, no problems with respiration noted BREASTS: symmetric, non-tender, no masses palpable ABDOMEN: Soft, nontender, nondistended, gravid. GU: normal appearing external female genitalia, nulliparous normal appearing cervix, scant white discharge in vagina, no lesions noted, pap taken today Bimanual: 11 weeks sized uterus, no adnexal tenderness or palpable lesions noted MUSCULOSKELETAL: Normal range of motion. EXT:  No edema and no tenderness.   Assessment and Plan:  Pregnancy: G1P0 at [redacted]w[redacted]d by LMP  1. Dural venous sinus thrombosis  - Ambulatory referral to Neurology  2. Acute right arterial ischemic stroke, middle cerebral artery (MCA) (HCC)  - Ambulatory referral to Neurology  3. Intracranial hypertension Pt referred to neurology for evaluation since pt is now pregnant, need considerations for ? Need for anticoagulation while pregnant, any follow ups or interventions, medication usage (diamox) which it appears the patient is not taking and is she able to push, after review of chart pt has taken fioricet before  and tolerated it, will prescribe small rx of fioricet due to persistent headaches - Ambulatory referral to Neurology  4. Supervision of normal first pregnancy, antepartum  - Cytology - PAP - Cervicovaginal ancillary only - Culture, OB Urine - CBC/D/Plt+RPR+Rh+ABO+Rub Ab... - Genetic Screening  5. Cerebral venous thrombosis   6. [redacted] weeks gestation of pregnancy    Preterm labor symptoms and general obstetric precautions including but not limited to vaginal bleeding, contractions, leaking of fluid and fetal movement were reviewed in detail with the patient.  Please refer to After Visit Summary for other counseling recommendations.   Return in about 2 weeks (around 05/19/2020) for HOB, in person.  Lawrence A Bass 05/05/2020 2:36 PM 

## 2020-05-06 LAB — CBC/D/PLT+RPR+RH+ABO+RUB AB...
Antibody Screen: NEGATIVE
Basophils Absolute: 0.1 10*3/uL (ref 0.0–0.2)
Basos: 1 %
EOS (ABSOLUTE): 0.1 10*3/uL (ref 0.0–0.4)
Eos: 1 %
HCV Ab: <0.1 s/co ratio (ref 0.0–0.9)
HIV Screen 4th Generation wRfx: NONREACTIVE
Hematocrit: 37.6 % (ref 34.0–46.6)
Hemoglobin: 12.5 g/dL (ref 11.1–15.9)
Hepatitis B Surface Ag: NEGATIVE
Immature Grans (Abs): 0 10*3/uL (ref 0.0–0.1)
Immature Granulocytes: 0 %
Lymphocytes Absolute: 1.3 10*3/uL (ref 0.7–3.1)
Lymphs: 17 %
MCH: 28.4 pg (ref 26.6–33.0)
MCHC: 33.2 g/dL (ref 31.5–35.7)
MCV: 86 fL (ref 79–97)
Monocytes Absolute: 0.9 10*3/uL (ref 0.1–0.9)
Monocytes: 12 %
Neutrophils Absolute: 5.3 10*3/uL (ref 1.4–7.0)
Neutrophils: 69 %
Platelets: 185 10*3/uL (ref 150–450)
RBC: 4.4 x10E6/uL (ref 3.77–5.28)
RDW: 13.5 % (ref 11.7–15.4)
RPR Ser Ql: NONREACTIVE
Rh Factor: POSITIVE
Rubella Antibodies, IGG: 17.5 index (ref >0.99)
WBC: 7.6 10*3/uL (ref 3.4–10.8)

## 2020-05-06 LAB — HCV INTERPRETATION

## 2020-05-07 LAB — CULTURE, OB URINE

## 2020-05-07 LAB — URINE CULTURE, OB REFLEX

## 2020-05-09 LAB — CERVICOVAGINAL ANCILLARY ONLY
Bacterial Vaginitis (gardnerella): NEGATIVE
Candida Glabrata: NEGATIVE
Candida Vaginitis: NEGATIVE
Chlamydia: NEGATIVE
Comment: NEGATIVE
Comment: NEGATIVE
Comment: NEGATIVE
Comment: NEGATIVE
Comment: NEGATIVE
Comment: NORMAL
Neisseria Gonorrhea: NEGATIVE
Trichomonas: NEGATIVE

## 2020-05-10 ENCOUNTER — Encounter: Payer: Self-pay | Admitting: Obstetrics and Gynecology

## 2020-05-10 LAB — CYTOLOGY - PAP
Chlamydia: NEGATIVE
Comment: NEGATIVE
Comment: NEGATIVE
Comment: NORMAL
Diagnosis: NEGATIVE
High risk HPV: NEGATIVE
Neisseria Gonorrhea: NEGATIVE

## 2020-05-15 NOTE — Progress Notes (Signed)
NEUROLOGY FOLLOW UP OFFICE NOTE  Fletcher Pask [id number]   Subjective:  Andrea Hayes is a 29 year old woman who follow up for papilledema.  UPDATE: Last seen in June 2020.  At that time, she was taking Diamox 1056m twice daily; topiramate 516mtwice daily; acetaminophen-caffeine PRN; Zofran; Warfarin.  She was subsequently lost to follow-up.  She was subsequently taken off of warfarin.  Last eye exam in February 2020 showed significant papilledema and she acetazolamide was increased to 10003mwice daily.  She stopped topiramate, acetazolamide and warfarin a while ago because she was feeling okay and never followed up for an eye exam.  She is now pregnant [redacted] weeks gestation.  She has been having daily headaches, sometimes severe, and has been taking Tylenol.  Notes pulsatile tinnitus.  Denies vision changes.  CBC and CMP from last month reviewed.  HISTORY: She began experiencing blurred vision and intermittent headacheson top of head. In October.She also reports visual obscurations as well. 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 The next day she went to the ED for further evaluation and treatment. MRI of brain with and without contrast was personally reviewed and demonstrated tortuous optic nerves with prominent optic nerve sheath fluid which may be seen in setting of intracranial hypertension. Neurology was consulted and LP was recommended. However, she declined the procedure and left against medical advice.  She followed up with me in January 2020 and underwent workup for intracranial hypertension. Lumbar puncture from 07/09/18 demonstrated an opening pressure of 47 cm water with closing pressure of 16 cm water. CSF cell count, glucose, protein and culture were normal. She was started on Diamox 500m1mice daily. MRV of head from 07/12/18 was personally reviewed and was normal with no evidence of thrombosis or  stenosis. [facility] for severe headache with nausea, vomiting and photophobia. CT of head was personally reviewed and showed dural venous sinus thrombosis involving the superior sagittal sinus, straight sinus, right transverse sinus and sigmoid sinus and smaller branching veins. Follow up CTV of head confirmed acute venous sinus thrombosis from superior sagittal sinus to right sigmoid sinus and within intracerebral veins. Hypercoagulable workup was unremarkable, includinghomocystein 4.8, antithrombin III mildly elevated at 128, low protein S activity of 22, total protein S 109, protein C activity 180, total protein C 127, lupus anticoagulant 25.3, DRVVT 44.7, beta-2-glycoprotein antibodies <9, factor V leiden mutation negative, prothrombin gene mutation negative, cardiolipin antibodies negative, sickle cell screen negative and negative for pregnancy. She was started on lovenox and bridged to warfarin. She was discharged on Fioricet for her headaches. She was started on topiramate 50mg47mce daily.Headache never improved and returned to the ED on 08/04/18. INR was therapeutic at2.43. CT personally reviewed and showed subacute dural venous sinus thrombosis and intracerebral veins with partial recanalization and no acute component. She received IV Reglan and Benadryl and headache resolved. She continued to have a constant 10/10 left sided and bifrontal headache.   PAST MEDICAL HISTORY: Past Medical History:  Diagnosis Date  . Medical history non-contributory     MEDICATIONS: Current Outpatient Medications on File Prior to Visit  Medication Sig Dispense Refill  . acetaminophen (TYLENOL) 325 MG tablet Take 2 tablets (650 mg total) by mouth every 6 (six) hours as needed for mild pain or headache (do not take over 3000 mg in a day of tylenol. please note there is tylenol in the fiorecet).    .Marland Kitchen  Blood Pressure Monitoring (BLOOD PRESSURE KIT) DEVI 1 kit by Does not  apply route as needed. 1 each 0  . Butalbital-APAP-Caffeine 50-325-40 MG capsule Take 1-2 capsules by mouth every 6 (six) hours as needed for headache. 20 capsule 0  . cyclobenzaprine (FLEXERIL) 10 MG tablet TAKE 1 TABLET BY MOUTH 3 TIMES DAILY AS NEEDED FOR MUSCLE SPASMS 30 tablet 0  . Prenatal Vit-Fe Fumarate-FA (PRENATAL VITAMIN) 27-0.8 MG TABS Take 1 tablet by mouth daily. 30 tablet 12  . promethazine (PHENERGAN) 12.5 MG tablet Take 1 tablet (12.5 mg total) by mouth every 6 (six) hours as needed for nausea or vomiting. (Patient not taking: Reported on 05/05/2020) 30 tablet 0  . simethicone (GAS-X) 80 MG chewable tablet Chew 1 tablet (80 mg total) by mouth every 6 (six) hours as needed for flatulence. (Patient not taking: Reported on 05/05/2020) 30 tablet 0   No current facility-administered medications on file prior to visit.    ALLERGIES: No Known Allergies  FAMILY HISTORY: No family history on file.  SOCIAL HISTORY: Social History   Socioeconomic History  . Marital status: Single    Spouse name: Not on file  . Number of children: Not on file  . Years of education: Not on file  . Highest education level: Not on file  Occupational History  . Not on file  Tobacco Use  . Smoking status: Never Smoker  . Smokeless tobacco: Never Used  Vaping Use  . Vaping Use: Never used  Substance and Sexual Activity  . Alcohol use: No    Alcohol/week: 0.0 standard drinks  . Drug use: No  . Sexual activity: Yes    Birth control/protection: None  Other Topics Concern  . Not on file  Social History Narrative  . Not on file   Social Determinants of Health   Financial Resource Strain:   . Difficulty of Paying Living Expenses: Not on file  Food Insecurity:   . Worried About Charity fundraiser in the Last Year: Not on file  . Ran Out of Food in the Last Year: Not on file  Transportation Needs:   . Lack of Transportation (Medical): Not on file  . Lack of Transportation (Non-Medical): Not  on file  Physical Activity:   . Days of Exercise per Week: Not on file  . Minutes of Exercise per Session: Not on file  Stress:   . Feeling of Stress : Not on file  Social Connections:   . Frequency of Communication with Friends and Family: Not on file  . Frequency of Social Gatherings with Friends and Family: Not on file  . Attends Religious Services: Not on file  . Active Member of Clubs or Organizations: Not on file  . Attends Archivist Meetings: Not on file  . Marital Status: Not on file  Intimate Partner Violence:   . Fear of Current or Ex-Partner: Not on file  . Emotionally Abused: Not on file  . Physically Abused: Not on file  . Sexually Abused: Not on file     Objective:  Blood pressure 105/74, pulse 88, height 5' 1"  (1.549 m), weight 153 lb 9.6 oz (69.7 kg), last menstrual period 02/14/2020, SpO2 97 %. General: No acute distress.  Patient appears well-groomed.   Head:  Normocephalic/atraumatic Eyes:  Fundi examined but not visualized Neck: supple, no paraspinal tenderness, full range of motion Heart:  Regular rate and rhythm Lungs:  Clear to auscultation bilaterally Back: No paraspinal tenderness Neurological Exam: alert and oriented  to person, place, and time. Attention span and concentration intact, recent and remote memory intact, fund of knowledge intact.  Speech fluent and not dysarthric, language intact.  CN II-XII intact. Bulk and tone normal, muscle strength 5/5 throughout.  Sensation to light touch, temperature and vibration intact.  Deep tendon reflexes 2+ throughout, toes downgoing.  Finger to nose and heel to shin testing intact.  Gait normal, Romberg negative.   Assessment/Plan:   1.  Idiopathic intracranial hypertension 2.  History of venous sinus thrombosis 3.  Pregnant at 11 gestation  1.  As headaches have returned since pregnancy, I would like to get MRV of head without contrast to evaluate for any new possible thrombosis 2.  Will restart  acetazolamide 576m twice daily, unless Dr. BElgie Congohas any objection.  It is generally safe during pregnancy. 3.  I would like her to get a repeat eye exam with Dr. WKathlen Modyin about 6 to 8 weeks to assess efficacy of the acetazolamide. 4.  May use Tylenol as needed but try to limit use of pain relievers to no more than 2 days out of week to prevent risk of rebound or medication-overuse headache. 5.  Follow up 4 to 6 months.  AMetta Clines DO  CC:  ENolene Ebbs MD  LLynnda Shields MD

## 2020-05-16 ENCOUNTER — Encounter: Payer: Self-pay | Admitting: Neurology

## 2020-05-16 ENCOUNTER — Other Ambulatory Visit: Payer: Self-pay

## 2020-05-16 ENCOUNTER — Ambulatory Visit (INDEPENDENT_AMBULATORY_CARE_PROVIDER_SITE_OTHER): Payer: 59 | Admitting: Neurology

## 2020-05-16 ENCOUNTER — Encounter: Payer: Self-pay | Admitting: Obstetrics and Gynecology

## 2020-05-16 VITALS — BP 105/74 | HR 88 | Ht 61.0 in | Wt 153.6 lb

## 2020-05-16 DIAGNOSIS — Z86718 Personal history of other venous thrombosis and embolism: Secondary | ICD-10-CM | POA: Diagnosis not present

## 2020-05-16 DIAGNOSIS — G932 Benign intracranial hypertension: Secondary | ICD-10-CM

## 2020-05-16 DIAGNOSIS — Z3A11 11 weeks gestation of pregnancy: Secondary | ICD-10-CM

## 2020-05-16 MED ORDER — ACETAZOLAMIDE 250 MG PO TABS: 500.0000 mg | ORAL_TABLET | Freq: Two times a day (BID) | ORAL | 5 refills | Status: DC

## 2020-05-16 NOTE — Telephone Encounter (Signed)
Patient called in and left message with Access Nurse. She is returning a call from [place]

## 2020-05-16 NOTE — Patient Instructions (Addendum)
1.  I have sent a prescription for acetazolamide.  Take as directed.  I want you to schedule a follow up appointment with Dr. Alben Spittle at [facility] in 6 weeks.   2.  We will also check MRV of head without contrast. 3.  Follow up in 4 months.

## 2020-05-18 ENCOUNTER — Other Ambulatory Visit: Payer: Self-pay

## 2020-05-18 ENCOUNTER — Encounter (HOSPITAL_COMMUNITY): Payer: Self-pay | Admitting: Obstetrics and Gynecology

## 2020-05-18 ENCOUNTER — Inpatient Hospital Stay (HOSPITAL_COMMUNITY)
Admission: AD | Admit: 2020-05-18 | Discharge: 2020-05-18 | Disposition: A | Discharge status: Home / Self Care | Payer: 59 | Attending: Obstetrics and Gynecology | Admitting: Obstetrics and Gynecology

## 2020-05-18 DIAGNOSIS — R109 Unspecified abdominal pain: Secondary | ICD-10-CM

## 2020-05-18 DIAGNOSIS — R102 Pelvic and perineal pain: Secondary | ICD-10-CM | POA: Insufficient documentation

## 2020-05-18 DIAGNOSIS — Z3A13 13 weeks gestation of pregnancy: Secondary | ICD-10-CM | POA: Diagnosis not present

## 2020-05-18 DIAGNOSIS — O26891 Other specified pregnancy related conditions, first trimester: Secondary | ICD-10-CM | POA: Diagnosis not present

## 2020-05-18 DIAGNOSIS — N949 Unspecified condition associated with female genital organs and menstrual cycle: Secondary | ICD-10-CM

## 2020-05-18 LAB — URINALYSIS, ROUTINE W REFLEX MICROSCOPIC
Bilirubin Urine: NEGATIVE
Glucose, UA: NEGATIVE mg/dL
Hgb urine dipstick: NEGATIVE
Ketones, ur: NEGATIVE mg/dL
Leukocytes,Ua: NEGATIVE
Nitrite: NEGATIVE
Protein, ur: NEGATIVE mg/dL
Specific Gravity, Urine: 1.009 (ref 1.005–1.030)
pH: 6 (ref 5.0–8.0)

## 2020-05-18 MED ORDER — IBUPROFEN 800 MG PO TABS
400.0000 mg | ORAL_TABLET | Freq: Once | ORAL | Status: AC
  Administered 2020-05-18: 400 mg via ORAL
  Filled 2020-05-18: qty 1

## 2020-05-18 NOTE — Discharge Instructions (Signed)
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
## 2020-05-20 ENCOUNTER — Encounter: Payer: Self-pay | Admitting: Obstetrics and Gynecology

## 2020-05-20 ENCOUNTER — Encounter: Payer: Self-pay | Admitting: Obstetrics

## 2020-05-20 ENCOUNTER — Other Ambulatory Visit: Payer: Self-pay

## 2020-05-20 ENCOUNTER — Ambulatory Visit (INDEPENDENT_AMBULATORY_CARE_PROVIDER_SITE_OTHER): Payer: 59 | Admitting: Obstetrics and Gynecology

## 2020-05-20 VITALS — BP 105/72 | HR 87 | Wt 154.0 lb

## 2020-05-20 DIAGNOSIS — Z3400 Encounter for supervision of normal first pregnancy, unspecified trimester: Secondary | ICD-10-CM

## 2020-05-20 DIAGNOSIS — Z3A13 13 weeks gestation of pregnancy: Secondary | ICD-10-CM

## 2020-05-20 DIAGNOSIS — G08 Intracranial and intraspinal phlebitis and thrombophlebitis: Secondary | ICD-10-CM

## 2020-05-20 DIAGNOSIS — R519 Headache, unspecified: Secondary | ICD-10-CM

## 2020-05-20 DIAGNOSIS — O26891 Other specified pregnancy related conditions, first trimester: Secondary | ICD-10-CM

## 2020-05-20 DIAGNOSIS — Z3401 Encounter for supervision of normal first pregnancy, first trimester: Secondary | ICD-10-CM

## 2020-05-20 NOTE — Progress Notes (Signed)
ROB, patient has questions but wants to discuss it privately with the Provider.

## 2020-05-20 NOTE — Progress Notes (Signed)
   PRENATAL VISIT NOTE  Subjective:  Andrea Hayes is a 29 y.o. G1P0 at [redacted]w[redacted]d being seen today for ongoing prenatal care.  She is currently monitored for the following issues for this low-risk pregnancy and has Cerebral venous thrombosis; Acute right arterial ischemic stroke, middle cerebral artery (MCA) (HCC); Dural venous sinus thrombosis; Acute blood loss anemia; Intracranial hypertension; Supervision of normal first pregnancy, antepartum; [redacted] weeks gestation of pregnancy; and Headache in pregnancy, antepartum, first trimester on their problem list.  Patient reports no complaints.  Contractions: Not present. Vag. Bleeding: None.  Movement: Present. Denies leaking of fluid.   The following portions of the patient's history were reviewed and updated as appropriate: allergies, current medications, past family history, past medical history, past social history, past surgical history and problem list.   Objective:   Vitals:   05/20/20 1048  BP: 105/72  Pulse: 87  Weight: 154 lb (69.9 kg)    Fetal Status: Fetal Heart Rate (bpm): 159   Movement: Present     General:  Alert, oriented and cooperative. Patient is in no acute distress.  Skin: Skin is warm and dry. No rash noted.   Cardiovascular: Normal heart rate noted  Respiratory: Normal respiratory effort, no problems with respiration noted  Abdomen: Soft, gravid, appropriate for gestational age.  Pain/Pressure: Present     Pelvic: Cervical exam deferred        Extremities: Normal range of motion.  Edema: None  Mental Status: Normal mood and affect. Normal behavior. Normal judgment and thought content.   Assessment and Plan:  Pregnancy: G1P0 at [redacted]w[redacted]d 1. Supervision of normal first pregnancy, antepartum Patient is doing well without complaints Anatomy ultrasound ordered - US MFM OB COMP + 14 WK; Future  2. Headache in pregnancy, antepartum, first trimester Significantly improved  3. Dural venous sinus thrombosis Patient seen by  neurologist Scheduled for MRV next week  Preterm labor symptoms and general obstetric precautions including but not limited to vaginal bleeding, contractions, leaking of fluid and fetal movement were reviewed in detail with the patient. Please refer to After Visit Summary for other counseling recommendations.   Return in about 4 weeks (around 06/17/2020) for in person, ROB, High risk.  Future Appointments  Date Time Provider Department Center  05/24/2020  7:00 AM [facility] 1 [facility]  06/17/2020  9:30 AM Adam Phenix, MD [facility] None  07/06/2020  8:45 AM [facility] US5 [facility]  09/14/2020 11:10 AM Drema Dallas, DO [facility] None    Catalina Antigua, MD

## 2020-05-24 ENCOUNTER — Telehealth: Payer: Self-pay | Admitting: Neurology

## 2020-05-24 ENCOUNTER — Other Ambulatory Visit: Payer: Self-pay | Admitting: Family Medicine

## 2020-05-24 ENCOUNTER — Other Ambulatory Visit: Payer: Self-pay

## 2020-05-24 ENCOUNTER — Ambulatory Visit (HOSPITAL_COMMUNITY)
Admission: RE | Admit: 2020-05-24 | Discharge: 2020-05-24 | Disposition: A | Discharge status: Home / Self Care | Payer: 59 | Source: Ambulatory Visit | Attending: Neurology | Admitting: Neurology

## 2020-05-24 DIAGNOSIS — G932 Benign intracranial hypertension: Secondary | ICD-10-CM

## 2020-05-24 DIAGNOSIS — Z3A11 11 weeks gestation of pregnancy: Secondary | ICD-10-CM | POA: Diagnosis present

## 2020-05-24 DIAGNOSIS — Z86718 Personal history of other venous thrombosis and embolism: Secondary | ICD-10-CM | POA: Diagnosis present

## 2020-05-24 MED ORDER — ENOXAPARIN SODIUM 80 MG/0.8ML ~~LOC~~ SOLN: 80.0000 mg | Freq: Two times a day (BID) | SUBCUTANEOUS | 3 refills | Status: DC

## 2020-05-24 NOTE — Telephone Encounter (Signed)
Pt advised.

## 2020-05-24 NOTE — Telephone Encounter (Signed)
Pt advised of DR.JAffe note.    Pt wanted to know if you will or if Dr.Bass will send a script in for the Anticoagulate?

## 2020-05-24 NOTE — Telephone Encounter (Signed)
Reviewed MRV of head.  It continues to show blood clot in the head, but unable to tell if all chronic or if some of it is a new clot.  Therefore, I recommend that she be on anticoagulation again during her pregnancy (since pregnancy increases risk of blood clots).  I have relayed this information to her OB, Dr. Donavan Foil.  I suspect that his office will contact her but she should feel free to contact his office for further instruction.

## 2020-05-24 NOTE — Telephone Encounter (Signed)
I do not prescribe anticoagulants.  Dr. Donavan Foil is aware and I have sent this information to one of his colleagues that helps manage complicated cases.  Dr. Laverna Peace office will likely reach out to her.

## 2020-05-25 ENCOUNTER — Telehealth: Payer: Self-pay | Admitting: Neurology

## 2020-05-25 NOTE — Telephone Encounter (Signed)
The following message was left with [employer] on 05/25/20 at 12:19 PM.  Caller is requesting her MRI results from yesterday.

## 2020-05-25 NOTE — Telephone Encounter (Signed)
Spoke to pt yesterday about results and recommendation from DR. Jaffe.

## 2020-05-27 ENCOUNTER — Encounter: Payer: Self-pay | Admitting: Advanced Practice Midwife

## 2020-05-27 DIAGNOSIS — D696 Thrombocytopenia, unspecified: Secondary | ICD-10-CM | POA: Insufficient documentation

## 2020-05-27 LAB — COMPREHENSIVE METABOLIC PANEL
ALT: 17 U/L (ref 0–44)
AST: 20 U/L (ref 15–41)
Albumin: 3.4 g/dL — ABNORMAL LOW (ref 3.5–5.0)
Alkaline Phosphatase: 64 U/L (ref 38–126)
Anion gap: 10 (ref 5–15)
BUN: 5 mg/dL — ABNORMAL LOW (ref 6–20)
CO2: 21 mmol/L — ABNORMAL LOW (ref 22–32)
Calcium: 9.1 mg/dL (ref 8.9–10.3)
Chloride: 99 mmol/L (ref 98–111)
Creatinine, Ser: 0.63 mg/dL (ref 0.44–1.00)
GFR, Estimated: >60 mL/min (ref >60)
Glucose, Bld: 87 mg/dL (ref 70–99)
Potassium: 3.8 mmol/L (ref 3.5–5.1)
Sodium: 130 mmol/L — ABNORMAL LOW (ref 135–145)
Total Bilirubin: 0.4 mg/dL (ref 0.3–1.2)
Total Protein: 7.4 g/dL (ref 6.5–8.1)

## 2020-05-27 LAB — CBC
HCT: 40.1 % (ref 36.0–46.0)
Hemoglobin: 13.1 g/dL (ref 12.0–15.0)
MCH: 28.4 pg (ref 26.0–34.0)
MCHC: 32.7 g/dL (ref 30.0–36.0)
MCV: 86.8 fL (ref 80.0–100.0)
Platelets: 110 10*3/uL — ABNORMAL LOW (ref 150–400)
RBC: 4.62 MIL/uL (ref 3.87–5.11)
RDW: 13.2 % (ref 11.5–15.5)
WBC: 10.2 10*3/uL (ref 4.0–10.5)
nRBC: 0 % (ref 0.0–0.2)

## 2020-05-30 ENCOUNTER — Inpatient Hospital Stay (HOSPITAL_COMMUNITY)
Admission: AD | Admit: 2020-05-30 | Discharge: 2020-05-31 | Disposition: A | Discharge status: Home / Self Care | Payer: 59 | Attending: Obstetrics and Gynecology | Admitting: Obstetrics and Gynecology

## 2020-05-30 ENCOUNTER — Other Ambulatory Visit: Payer: Self-pay

## 2020-05-30 ENCOUNTER — Telehealth: Payer: Self-pay | Admitting: Lactation Services

## 2020-05-30 DIAGNOSIS — R102 Pelvic and perineal pain: Secondary | ICD-10-CM | POA: Insufficient documentation

## 2020-05-30 DIAGNOSIS — Z3A15 15 weeks gestation of pregnancy: Secondary | ICD-10-CM

## 2020-05-30 DIAGNOSIS — O26899 Other specified pregnancy related conditions, unspecified trimester: Secondary | ICD-10-CM

## 2020-05-30 DIAGNOSIS — Z7901 Long term (current) use of anticoagulants: Secondary | ICD-10-CM | POA: Insufficient documentation

## 2020-05-30 DIAGNOSIS — O26892 Other specified pregnancy related conditions, second trimester: Secondary | ICD-10-CM | POA: Insufficient documentation

## 2020-05-30 DIAGNOSIS — R1031 Right lower quadrant pain: Secondary | ICD-10-CM | POA: Insufficient documentation

## 2020-05-30 DIAGNOSIS — Z79899 Other long term (current) drug therapy: Secondary | ICD-10-CM | POA: Insufficient documentation

## 2020-05-30 DIAGNOSIS — M5431 Sciatica, right side: Secondary | ICD-10-CM

## 2020-05-30 HISTORY — DX: Cerebral infarction, unspecified: I63.9

## 2020-05-30 NOTE — Telephone Encounter (Signed)
Called patient with assistance of [employer] [place] interpreter, Aram Beecham # [phone]. Patient did not use the interpreter although she was on the phone.   Patient was informed that it is recommended that she take Lovenox during pregnancy to prevent further thrombosis. Informed patient she needs to pick up today and begin taking by injection every 12 hours. Reviewed the Pharmacist can show her how to draw up and inject and if that does not work out she can call the office and schedule an appointment with the nurse to learn how to give herself the injections. Patient voiced understanding.

## 2020-05-30 NOTE — Telephone Encounter (Signed)
-----   Message from Levie Heritage, DO sent at 05/24/2020  3:25 PM EST ----- Lovenox is the best anticoagulation during pregnancy. She'll need 1mg /kg BID dosing, which I prescribed.   I will have my office staff reach out to her and let her know that this is available to pick up. These should be prefilled syringes and she may need teaching on how to use.   Thanks for advising of the situation!  [place] ----- Message ----- From: Gerilyn Pilgrim, DO Sent: 05/24/2020   9:58 AM EST To: 05/26/2020, DO  Hi Dr. Levie Heritage,  I was asked by Dr. Adrian Blackwater to forward you this information about one of our complicated cases.  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  She was on acetazolamide and warfarin and at some point lost to follow up.  Apparently, headaches improved and she took herself off of acetazolamide and warfarin without supervision of a physician.  She became pregnant and is now under the care of Dr. 2021.  Due to her complicated history and reporting increased headaches, she followed up with me.  I ordered an MRV of the head.  It still shows thrombosis but cannot tell if it is all chronic or if it may also be acute unless she has a CTV (which she cannot have).  Given this uncertain finding of acuity, her hypercoagulable state and the fact that she didn't complete her treatment of warfarin, I would recommend that she be anticoagulated during her pregnancy.  IIs the anticoagulation something that you feel comfortable managing, because I have never been the one to manage anticoagulation in stroke patients.  Thank you, Donavan Foil

## 2020-05-31 ENCOUNTER — Encounter (HOSPITAL_COMMUNITY): Payer: Self-pay | Admitting: Obstetrics and Gynecology

## 2020-05-31 DIAGNOSIS — Z7901 Long term (current) use of anticoagulants: Secondary | ICD-10-CM | POA: Diagnosis not present

## 2020-05-31 DIAGNOSIS — Z3A15 15 weeks gestation of pregnancy: Secondary | ICD-10-CM | POA: Diagnosis not present

## 2020-05-31 DIAGNOSIS — R1031 Right lower quadrant pain: Secondary | ICD-10-CM | POA: Diagnosis not present

## 2020-05-31 DIAGNOSIS — R102 Pelvic and perineal pain: Secondary | ICD-10-CM

## 2020-05-31 DIAGNOSIS — M5431 Sciatica, right side: Secondary | ICD-10-CM | POA: Diagnosis not present

## 2020-05-31 DIAGNOSIS — Z79899 Other long term (current) drug therapy: Secondary | ICD-10-CM | POA: Diagnosis not present

## 2020-05-31 DIAGNOSIS — O26892 Other specified pregnancy related conditions, second trimester: Secondary | ICD-10-CM

## 2020-05-31 LAB — URINALYSIS, ROUTINE W REFLEX MICROSCOPIC
Bilirubin Urine: NEGATIVE
Glucose, UA: NEGATIVE mg/dL
Hgb urine dipstick: NEGATIVE
Ketones, ur: NEGATIVE mg/dL
Nitrite: NEGATIVE
Protein, ur: NEGATIVE mg/dL
Specific Gravity, Urine: 1.009 (ref 1.005–1.030)
pH: 6 (ref 5.0–8.0)

## 2020-05-31 MED ORDER — CYCLOBENZAPRINE HCL 10 MG PO TABS: 10.0000 mg | ORAL_TABLET | Freq: Two times a day (BID) | ORAL | 0 refills | Status: DC | PRN

## 2020-05-31 MED ORDER — CYCLOBENZAPRINE HCL 5 MG PO TABS
10.0000 mg | ORAL_TABLET | Freq: Once | ORAL | Status: AC
  Administered 2020-05-31: 10 mg via ORAL
  Filled 2020-05-31: qty 2

## 2020-05-31 MED ORDER — COMFORT FIT MATERNITY SUPP LG MISC: 1.0000 [IU] | Freq: Every day | 0 refills | Status: DC

## 2020-05-31 MED ORDER — ACETAMINOPHEN 325 MG PO TABS
650.0000 mg | ORAL_TABLET | Freq: Once | ORAL | Status: AC
  Administered 2020-05-31: 650 mg via ORAL
  Filled 2020-05-31: qty 2

## 2020-05-31 NOTE — MAU Provider Note (Signed)
History     CSN: [id number]  Arrival date and time: 05/30/20 2357   First Provider Initiated Contact with Patient 05/31/20 0036      Chief Complaint  Patient presents with  . Abdominal Pain   Andrea Hayes is a 29 y.o. G1P0 at 77w2dwho presents to MAU with complaints of abdominal pain. Patient reports that the abdominal pain started occurring yesterday. Describes as sharp shooting pain that starts in RLQ and shoots down thigh. Patient rates pain 10/10- has not taken any medication for pain. Patient reports pain gets worse with movement or when she changing positions. Denies vaginal bleeding or discharge.    OB History    Gravida  1   Para      Term      Preterm      AB      Living        SAB      TAB      Ectopic      Multiple      Live Births              Past Medical History:  Diagnosis Date  . Stroke ([facility]     Past Surgical History:  Procedure Laterality Date  . NO PAST SURGERIES      History reviewed. No pertinent family history.  Social History   Tobacco Use  . Smoking status: Never Smoker  . Smokeless tobacco: Never Used  Vaping Use  . Vaping Use: Never used  Substance Use Topics  . Alcohol use: No    Alcohol/week: 0.0 standard drinks  . Drug use: No    Allergies: No Known Allergies  Medications Prior to Admission  Medication Sig Dispense Refill Last Dose  . Prenatal Vit-Fe Fumarate-FA (PRENATAL VITAMIN) 27-0.8 MG TABS Take 1 tablet by mouth daily. 30 tablet 12 05/30/2020 at Unknown time  . acetaminophen (TYLENOL) 325 MG tablet Take 2 tablets (650 mg total) by mouth every 6 (six) hours as needed for mild pain or headache (do not take over 3000 mg in a day of tylenol. please note there is tylenol in the fiorecet).     .Marland KitchenacetaZOLAMIDE (DIAMOX) 250 MG tablet Take 2 tablets (500 mg total) by mouth 2 (two) times daily. 120 tablet 5   . Blood Pressure Monitoring (BLOOD PRESSURE KIT) DEVI 1 kit by Does not apply route as needed. 1 each 0    . Butalbital-APAP-Caffeine 50-325-40 MG capsule Take 1-2 capsules by mouth every 6 (six) hours as needed for headache. 20 capsule 0   . cyclobenzaprine (FLEXERIL) 10 MG tablet TAKE 1 TABLET BY MOUTH 3 TIMES DAILY AS NEEDED FOR MUSCLE SPASMS 30 tablet 0   . enoxaparin (LOVENOX) 80 MG/0.8ML injection Inject 0.8 mLs (80 mg total) into the skin every 12 (twelve) hours. 144 mL 3   . promethazine (PHENERGAN) 12.5 MG tablet Take 1 tablet (12.5 mg total) by mouth every 6 (six) hours as needed for nausea or vomiting. 30 tablet 0   . simethicone (GAS-X) 80 MG chewable tablet Chew 1 tablet (80 mg total) by mouth every 6 (six) hours as needed for flatulence. 30 tablet 0     Review of Systems  Constitutional: Negative.   Respiratory: Negative.   Cardiovascular: Negative.   Gastrointestinal: Positive for abdominal pain. Negative for constipation, diarrhea, nausea and vomiting.  Genitourinary: Negative.   Musculoskeletal: Negative.   Neurological: Negative.   Psychiatric/Behavioral: Negative.    Physical Exam   Blood pressure 106/72, pulse  80, temperature 98.2 F (36.8 C), temperature source Oral, resp. rate 15, weight 70.2 kg, last menstrual period 02/14/2020, SpO2 100 %.  Physical Exam Vitals and nursing note reviewed.  HENT:     Head: Normocephalic.  Cardiovascular:     Rate and Rhythm: Normal rate and regular rhythm.  Pulmonary:     Effort: Pulmonary effort is normal. No respiratory distress.     Breath sounds: Normal breath sounds. No wheezing.  Abdominal:     General: There is no distension.     Palpations: Abdomen is soft. There is no mass.     Tenderness: There is abdominal tenderness in the right lower quadrant. There is no right CVA tenderness, left CVA tenderness or guarding. Negative signs include McBurney's sign.     Hernia: No hernia is present.     Comments: Tenderness with palpation in RLQ. No rebound or McBurney's sign   Genitourinary:    Comments: Bimanual exam: Cervix  0/long/high, firm, anterior, neg CMT, uterus nontender  Skin:    General: Skin is warm and dry.  Neurological:     Mental Status: She is alert and oriented to person, place, and time.    MAU Course  Procedures  MDM Cervix closed/thick  Treatments in MAU including Flexeril and Tylenol for RLP and sciatica.   Meds ordered this encounter  Medications  . cyclobenzaprine (FLEXERIL) tablet 10 mg  . acetaminophen (TYLENOL) tablet 650 mg   Educated and discussed with patient RLP/sciatica and what to expect during pregnancy. Educated and discussed stretches and massages to help with pain. Also discussed maternity support belt.   Reassessment after medication - patient reports pain is resolved after medication. Rx for maternity support belt printed for patient. Refill for flexeril sent to pharmacy of choice.    Discussed reasons to return to MAU. Follow up as scheduled in the office. Return to MAU as needed. Pt stable at time of discharge.   Assessment and Plan   1. Pain of round ligament during pregnancy   2. Sciatica of right side   3. [redacted] weeks gestation of pregnancy    Discharge home Follow up as scheduled in the office for prenatal care Return to MAU as needed for reasons discussed and/or emergencies  Rx for maternity support belt and flexeril    Follow-up [facility] Follow up.   Specialty: Obstetrics and Gynecology Why: Follow up as scheduled  Contact information: [address], [place] [phone]             Allergies as of 05/31/2020   No Known Allergies     Medication List    TAKE these medications   acetaminophen 325 MG tablet Commonly known as: TYLENOL Take 2 tablets (650 mg total) by mouth every 6 (six) hours as needed for mild pain or headache (do not take over 3000 mg in a day of tylenol. please note there is tylenol in the fiorecet).   acetaZOLAMIDE 250 MG  tablet Commonly known as: DIAMOX Take 2 tablets (500 mg total) by mouth 2 (two) times daily.   Blood Pressure Kit Devi 1 kit by Does not apply route as needed.   Butalbital-APAP-Caffeine 50-325-40 MG capsule Take 1-2 capsules by mouth every 6 (six) hours as needed for headache.   Comfort Fit Maternity Supp Lg Misc 1 Units by Does not apply route daily.   cyclobenzaprine 10 MG tablet Commonly known as: FLEXERIL TAKE 1  TABLET BY MOUTH 3 TIMES DAILY AS NEEDED FOR MUSCLE SPASMS What changed: Another medication with the same name was added. Make sure you understand how and when to take each.   cyclobenzaprine 10 MG tablet Commonly known as: FLEXERIL Take 1 tablet (10 mg total) by mouth 2 (two) times daily as needed for muscle spasms. What changed: You were already taking a medication with the same name, and this prescription was added. Make sure you understand how and when to take each.   enoxaparin 80 MG/0.8ML injection Commonly known as: LOVENOX Inject 0.8 mLs (80 mg total) into the skin every 12 (twelve) hours.   Prenatal Vitamin 27-0.8 MG Tabs Take 1 tablet by mouth daily.   promethazine 12.5 MG tablet Commonly known as: PHENERGAN Take 1 tablet (12.5 mg total) by mouth every 6 (six) hours as needed for nausea or vomiting.   simethicone 80 MG chewable tablet Commonly known as: Gas-X Chew 1 tablet (80 mg total) by mouth every 6 (six) hours as needed for flatulence.       Lajean Manes 05/31/2020, 7:07 AM

## 2020-05-31 NOTE — Discharge Instructions (Signed)
   PREGNANCY SUPPORT BELT: You are not alone, Seventy-five percent of women have some sort of abdominal or back pain at some point in their pregnancy. Your baby is growing at a fast pace, which means that your whole body is rapidly trying to adjust to the changes. As your uterus grows, your back may start feeling a bit under stress and this can result in back or abdominal pain that can go from mild, and therefore bearable, to severe pains that will not allow you to sit or lay down comfortably, When it comes to dealing with pregnancy-related pains and cramps, some pregnant women usually prefer natural remedies, which the market is filled with nowadays. For example, wearing a pregnancy support belt can help ease and lessen your discomfort and pain. WHAT ARE THE BENEFITS OF WEARING A PREGNANCY SUPPORT BELT? A pregnancy support belt provides support to the lower portion of the belly taking some of the weight of the growing uterus and distributing to the other parts of your body. It is designed make you comfortable and gives you extra support. Over the years, the pregnancy apparel market has been studying the needs and wants of pregnant women and they have come up with the most comfortable pregnancy support belts that woman could ever ask for. In fact, you will no longer have to wear a stretched-out or bulky pregnancy belt that is visible underneath your clothes and makes you feel even more uncomfortable. Nowadays, a pregnancy support belt is made of comfortable and stretchy materials that will not irritate your skin but will actually make you feel at ease and you will not even notice you are wearing it. They are easy to put on and adjust during the day and can be worn at night for additional support.  BENEFITS: . Relives Back pain . Relieves Abdominal Muscle and Leg Pain . Stabilizes the Pelvic Ring . Offers a Cushioned Abdominal Lift Pad . Relieves pressure on the Sciatic Nerve Within Minutes WHERE TO GET  YOUR PREGNANCY BELT: [employer] [phone] @[phone]  [address] [place], [place]   Round Ligament Massage & Stretches  Massage: Starting at the middle of your pubic bone, trace little circles in a wide U from your pubic bone to your hip bones on both sides.  Then starting just above your pubic bone, press in and down, alternating sides to create a gentle rocking of your uterus back and forth.  Move your hands up the sides of your belly and back down. Do this 3-5 times upon waking and before bed.  Stretches: Get on hands and knees and alternate arching your back deeply while inhaling, and then rounding your back while exhaling. Modified runners lunge:  - Sit on a chair with half of your bottom on the chair and half off.  - Sit up tall, plant your front foot, and stretch your other foot out behind you.  - Breathe deeply for 5 breaths and then do the other side.

## 2020-05-31 NOTE — MAU Note (Signed)
.   Andrea Hayes is a 29 y.o. at [redacted]w[redacted]d here in MAU reporting: Patient has lower abdominal pain that started yesterday. She states that it shoots down her right leg. No VB or LOF. Has had N/V the past two days for a total of 4x in the past 24 hours. No recent intercourse.   Pain score: 10 Vitals:   05/31/20 0003  BP: 106/72  Pulse: 80  Resp: 15  Temp: 98.2 F (36.8 C)  SpO2: 100%     Lab orders placed from triage: UA

## 2020-06-01 LAB — CULTURE, OB URINE

## 2020-06-07 IMAGING — CT CT VENOGRAM HEAD
1 of 4 series · 19 of 47 positions shown · IV contrast (APPLIED)
Comparison: CT HEAD July 24, 2018 at 4343 hours.

CLINICAL DATA: Follow-up suspected dural venous sinus thrombosis.

EXAM:
CT VENOGRAM HEAD
TECHNIQUE: CT HEAD July 24, 2018 at 4343 hours
CONTRAST:  75mL OMNIPAQUE IOHEXOL 300 MG/ML  SOLN

[Series 7: venogram thins · axial · 0.43mm/px · z∈[-141,+0]mm · 19 of 388 slices shown]
[im 17/388  brain]
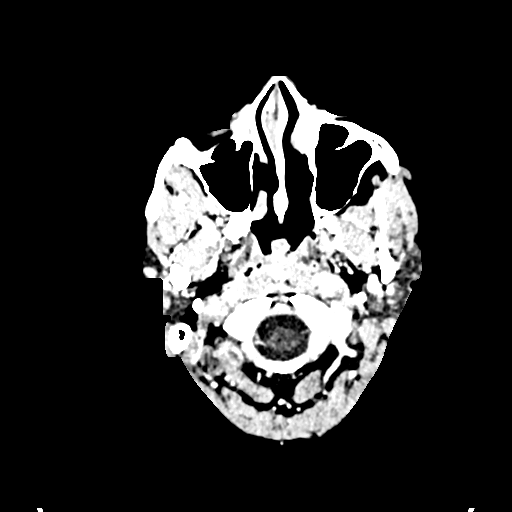
[im 33/388  bone]
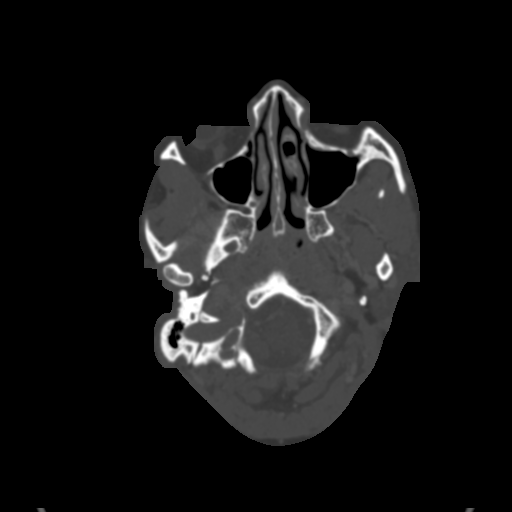
[im 65/388  brain]
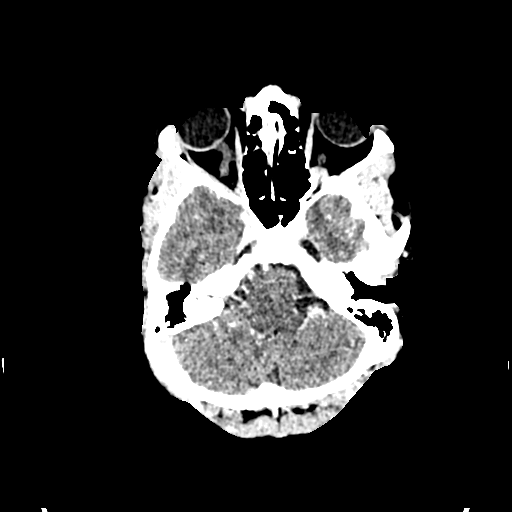
[im 81/388  bone]
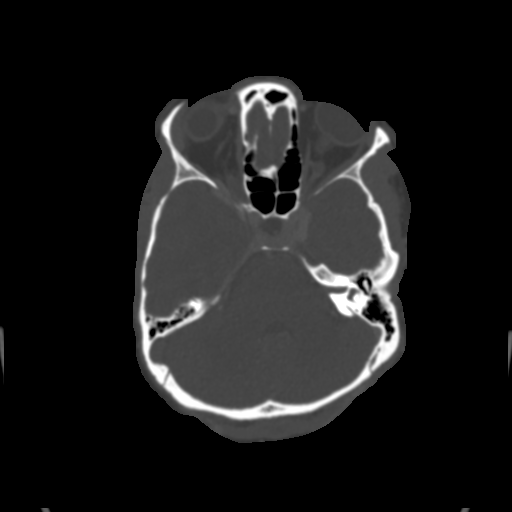
[im 97/388  brain]
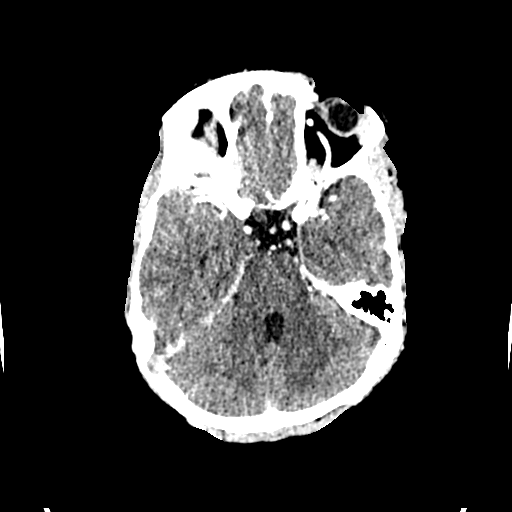
[im 113/388  bone]
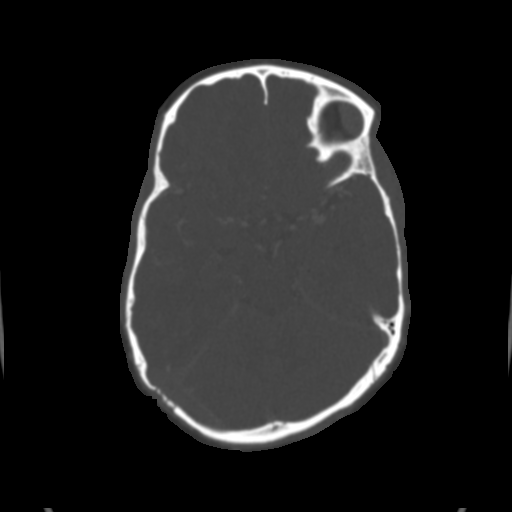
[im 130/388  brain]
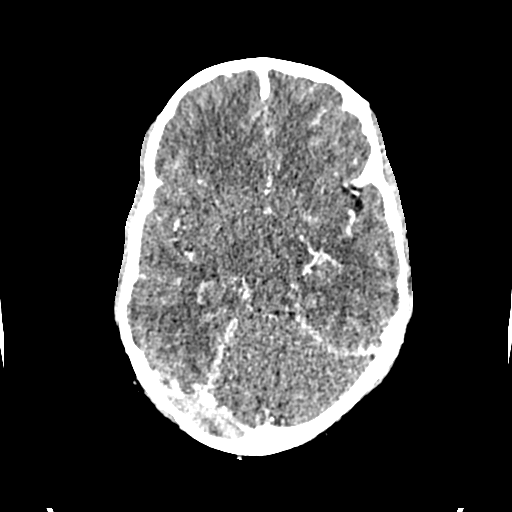
[im 162/388  bone]
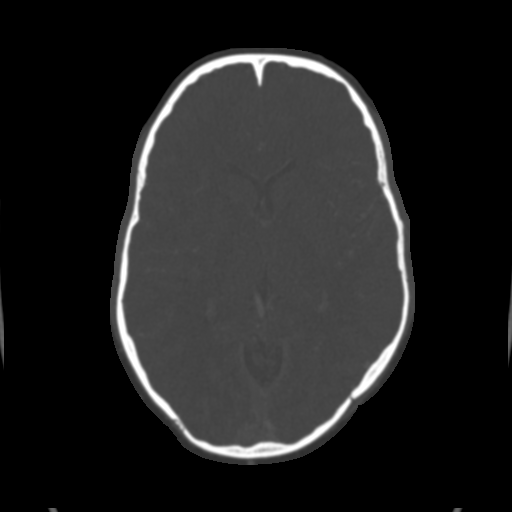
[im 178/388  brain]
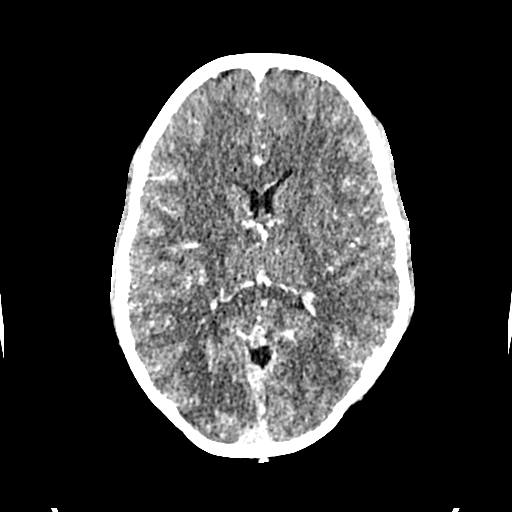
[im 194/388  bone]
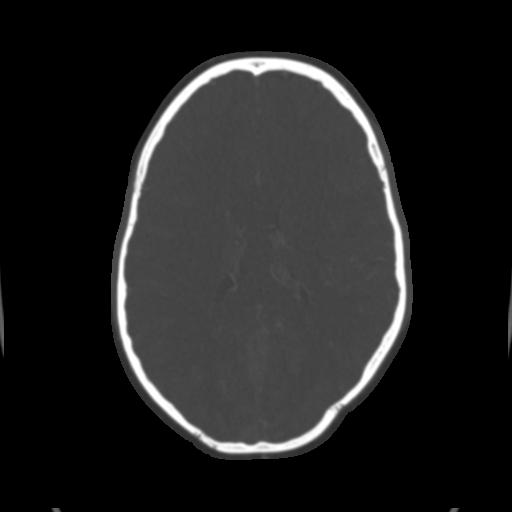
[im 210/388  brain]
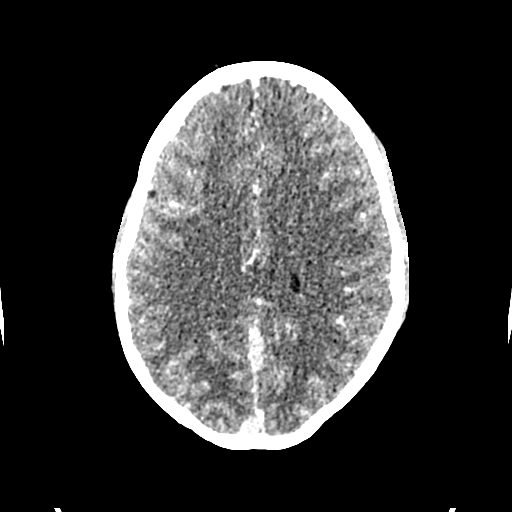
[im 226/388  bone]
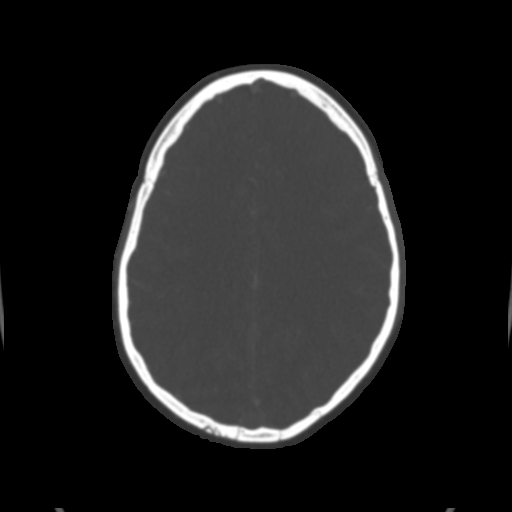
[im 259/388  brain]
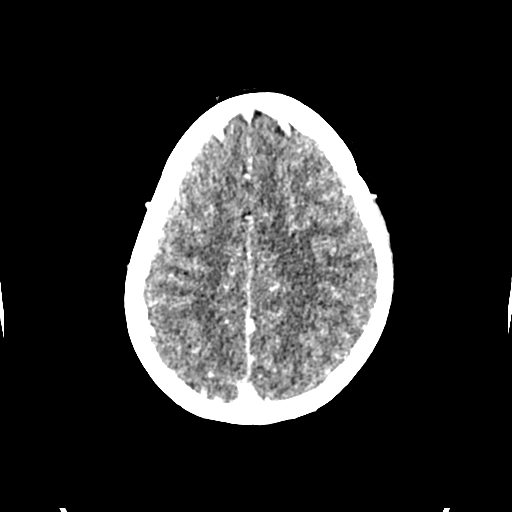
[im 275/388  bone]
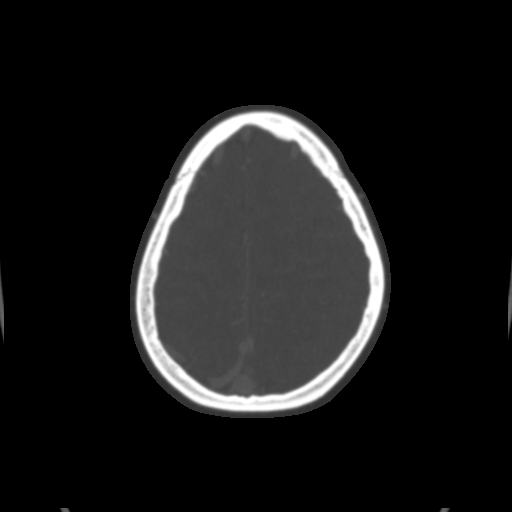
[im 291/388  brain]
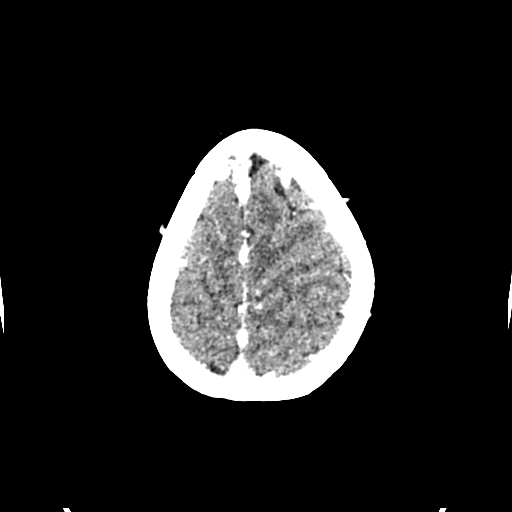
[im 307/388  bone]
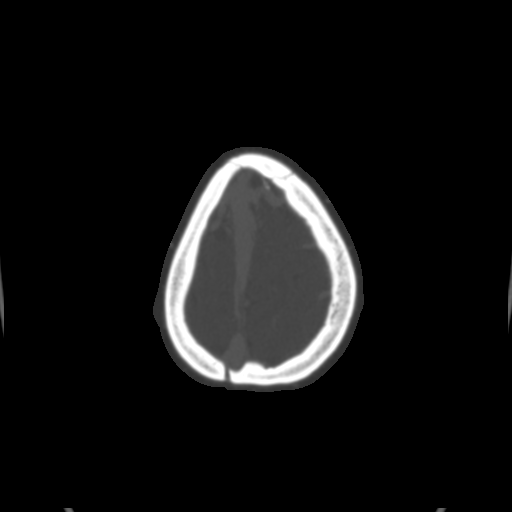
[im 323/388  brain]
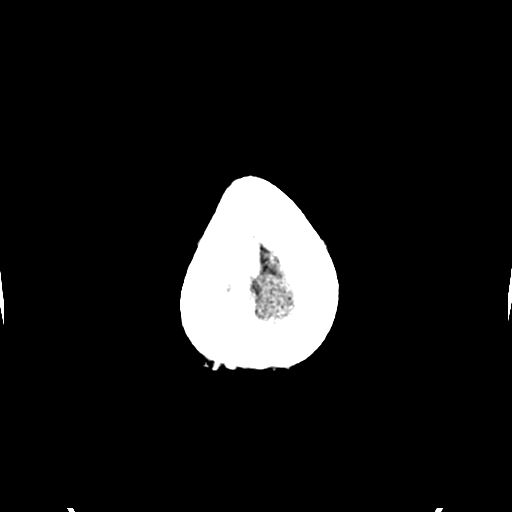
[im 355/388  bone]
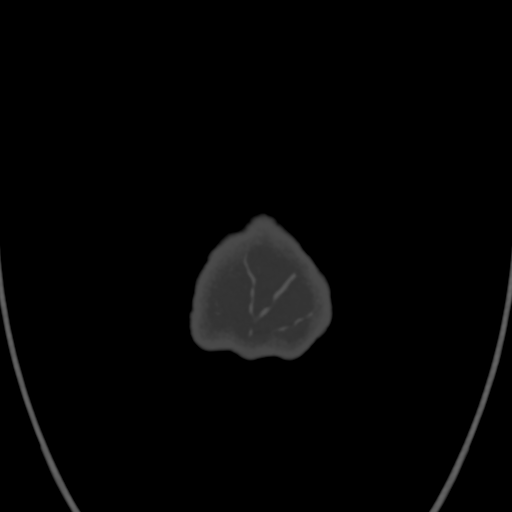
[im 371/388  brain]
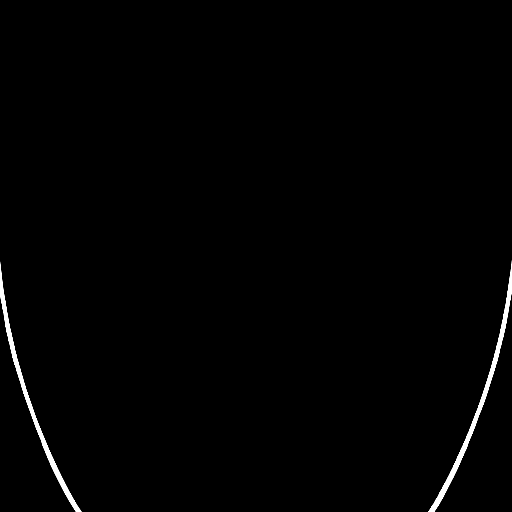

[19 of 47 positions shown; findings below may reference images not displayed]

FINDINGS: Intrinsically hypodense thrombus from proximal superior sagittal
sinus extending into torcula parotid Foley, RIGHT transverse and
sigmoid sinus. In addition, thrombus within the internal cerebral
veins including straight vein. Contrast surrounds the thrombus
within the superior sagittal sinus with patent remaining superior
sagittal sinus.
IMPRESSION: 1. Acute dural venous sinus thrombosis from superior sagittal sinus
to RIGHT sigmoid sinus and, within internal cerebral veins.
2. Critical Value/emergent results text paged to Dr.J CARLO, Neurology
via [employer] secure system on 07/24/2018 at [DATE], including
interpreting physician's phone number.

## 2020-06-17 ENCOUNTER — Other Ambulatory Visit: Payer: Self-pay

## 2020-06-17 ENCOUNTER — Ambulatory Visit (INDEPENDENT_AMBULATORY_CARE_PROVIDER_SITE_OTHER): Payer: 59 | Admitting: Obstetrics & Gynecology

## 2020-06-17 VITALS — BP 96/66 | HR 93 | Wt 159.0 lb

## 2020-06-17 DIAGNOSIS — O0990 Supervision of high risk pregnancy, unspecified, unspecified trimester: Secondary | ICD-10-CM

## 2020-06-17 DIAGNOSIS — G08 Intracranial and intraspinal phlebitis and thrombophlebitis: Secondary | ICD-10-CM

## 2020-06-17 DIAGNOSIS — G932 Benign intracranial hypertension: Secondary | ICD-10-CM

## 2020-06-17 MED ORDER — ENOXAPARIN SODIUM 80 MG/0.8ML ~~LOC~~ SOLN: 80.0000 mg | Freq: Two times a day (BID) | SUBCUTANEOUS | 3 refills | Status: DC

## 2020-06-17 NOTE — Progress Notes (Signed)
   PRENATAL VISIT NOTE  Subjective:  Andrea Hayes is a 29 y.o. G1P0 at [redacted]w[redacted]d being seen today for ongoing prenatal care.  She is currently monitored for the following issues for this high-risk pregnancy and has Cerebral venous thrombosis; Acute right arterial ischemic stroke, middle cerebral artery (MCA) (HCC); Dural venous sinus thrombosis; Acute blood loss anemia; Intracranial hypertension; Supervision of high risk pregnancy, antepartum; [redacted] weeks gestation of pregnancy; Headache in pregnancy, antepartum, first trimester; and Thrombocytopenia affecting pregnancy (HCC) on their problem list.  Patient reports no complaints.  Contractions: Not present. Vag. Bleeding: None.  Movement: Present. Denies leaking of fluid.   The following portions of the patient's history were reviewed and updated as appropriate: allergies, current medications, past family history, past medical history, past social history, past surgical history and problem list.   Objective:   Vitals:   06/17/20 0940  BP: 96/66  Pulse: 93  Weight: 159 lb (72.1 kg)    Fetal Status: Fetal Heart Rate (bpm): 148 Fundal Height: 18 cm Movement: Present     General:  Alert, oriented and cooperative. Patient is in no acute distress.  Skin: Skin is warm and dry. No rash noted.   Cardiovascular: Normal heart rate noted  Respiratory: Normal respiratory effort, no problems with respiration noted  Abdomen: Soft, gravid, appropriate for gestational age.  Pain/Pressure: Absent     Pelvic: Cervical exam deferred        Extremities: Normal range of motion.  Edema: None  Mental Status: Normal mood and affect. Normal behavior. Normal judgment and thought content.   Assessment and Plan:  Pregnancy: G1P0 at [redacted]w[redacted]d 1. Supervision of high risk pregnancy, antepartum Genetic screen - AFP, Serum, Open Spina Bifida  2. Intracranial hypertension Followed by Neuro  3. Dural venous sinus thrombosis Meds ordered this encounter  Medications  .  enoxaparin (LOVENOX) 80 MG/0.8ML injection    Sig: Inject 0.8 mLs (80 mg total) into the skin every 12 (twelve) hours.    Dispense:  144 mL    Refill:  3    - enoxaparin (LOVENOX) 80 MG/0.8ML injection; Inject 0.8 mLs (80 mg total) into the skin every 12 (twelve) hours.  Dispense: 144 mL; Refill: 3  Preterm labor symptoms and general obstetric precautions including but not limited to vaginal bleeding, contractions, leaking of fluid and fetal movement were reviewed in detail with the patient. Please refer to After Visit Summary for other counseling recommendations.   Return in about 4 weeks (around 07/15/2020).  Future Appointments  Date Time Provider Department Center  07/06/2020  8:45 AM [facility] US5 [facility]  09/14/2020 11:10 AM Drema Dallas, DO [facility] None    Scheryl Darter, MD

## 2020-06-17 NOTE — Progress Notes (Signed)
ROB/AFP 

## 2020-06-17 NOTE — Patient Instructions (Signed)

## 2020-06-19 LAB — AFP, SERUM, OPEN SPINA BIFIDA
AFP MoM: 1.01
AFP Value: 39.9 ng/mL
Gest. Age on Collection Date: 17.5 weeks
Maternal Age At EDD: 30.5 yr
OSBR Risk 1 IN: 10000
Test Results:: NEGATIVE
Weight: 159 [lb_av]

## 2020-06-22 ENCOUNTER — Telehealth: Payer: Self-pay | Admitting: *Deleted

## 2020-06-22 NOTE — Telephone Encounter (Signed)
Per Dr Donavan Foil, unknown if safe in pregnancy. He recommends pt be seen at Urgent Care or PCP and be given oral antibiotics.   Placed call to pt and made her aware of recommendations.

## 2020-06-22 NOTE — Telephone Encounter (Signed)
Pt called to office and states she feels like she has ear infection again and wants to know if medication she has at home is safe in pregnancy.  Pt was given Cortisporin for previous infection and wants to know if she can use this now for her current problem, if it is safe in pregnancy.   Pt made aware message to be sent to provider for recommendation.     Please advise.

## 2020-06-23 ENCOUNTER — Other Ambulatory Visit: Payer: Self-pay

## 2020-06-23 ENCOUNTER — Inpatient Hospital Stay (HOSPITAL_COMMUNITY)
Admission: AD | Admit: 2020-06-23 | Discharge: 2020-06-23 | Disposition: A | Discharge status: Home / Self Care | Payer: 59 | Attending: Family Medicine | Admitting: Family Medicine

## 2020-06-23 DIAGNOSIS — J069 Acute upper respiratory infection, unspecified: Secondary | ICD-10-CM | POA: Insufficient documentation

## 2020-06-23 DIAGNOSIS — O99412 Diseases of the circulatory system complicating pregnancy, second trimester: Secondary | ICD-10-CM | POA: Diagnosis not present

## 2020-06-23 DIAGNOSIS — Z3A18 18 weeks gestation of pregnancy: Secondary | ICD-10-CM | POA: Insufficient documentation

## 2020-06-23 DIAGNOSIS — I82890 Acute embolism and thrombosis of other specified veins: Secondary | ICD-10-CM | POA: Insufficient documentation

## 2020-06-23 DIAGNOSIS — Z79899 Other long term (current) drug therapy: Secondary | ICD-10-CM | POA: Diagnosis not present

## 2020-06-23 DIAGNOSIS — O99352 Diseases of the nervous system complicating pregnancy, second trimester: Secondary | ICD-10-CM | POA: Insufficient documentation

## 2020-06-23 DIAGNOSIS — I676 Nonpyogenic thrombosis of intracranial venous system: Secondary | ICD-10-CM | POA: Diagnosis not present

## 2020-06-23 DIAGNOSIS — R109 Unspecified abdominal pain: Secondary | ICD-10-CM | POA: Diagnosis not present

## 2020-06-23 DIAGNOSIS — O26892 Other specified pregnancy related conditions, second trimester: Secondary | ICD-10-CM | POA: Diagnosis not present

## 2020-06-23 DIAGNOSIS — O99891 Other specified diseases and conditions complicating pregnancy: Secondary | ICD-10-CM

## 2020-06-23 DIAGNOSIS — M7918 Myalgia, other site: Secondary | ICD-10-CM

## 2020-06-23 DIAGNOSIS — Z8673 Personal history of transient ischemic attack (TIA), and cerebral infarction without residual deficits: Secondary | ICD-10-CM | POA: Insufficient documentation

## 2020-06-23 DIAGNOSIS — O99512 Diseases of the respiratory system complicating pregnancy, second trimester: Secondary | ICD-10-CM | POA: Diagnosis present

## 2020-06-23 DIAGNOSIS — Z20822 Contact with and (suspected) exposure to covid-19: Secondary | ICD-10-CM | POA: Diagnosis not present

## 2020-06-23 DIAGNOSIS — G08 Intracranial and intraspinal phlebitis and thrombophlebitis: Secondary | ICD-10-CM

## 2020-06-23 LAB — URINALYSIS, ROUTINE W REFLEX MICROSCOPIC
Bilirubin Urine: NEGATIVE
Glucose, UA: NEGATIVE mg/dL
Hgb urine dipstick: NEGATIVE
Ketones, ur: NEGATIVE mg/dL
Leukocytes,Ua: NEGATIVE
Nitrite: NEGATIVE
Protein, ur: NEGATIVE mg/dL
Specific Gravity, Urine: 1.004 — ABNORMAL LOW (ref 1.005–1.030)
pH: 6 (ref 5.0–8.0)

## 2020-06-23 LAB — RESP PANEL BY RT-PCR (FLU A&B, COVID) ARPGX2
Influenza A by PCR: NEGATIVE
Influenza B by PCR: NEGATIVE
SARS Coronavirus 2 by RT PCR: NEGATIVE

## 2020-06-23 MED ORDER — ACETAMINOPHEN 500 MG PO TABS
1000.0000 mg | ORAL_TABLET | Freq: Once | ORAL | Status: AC
  Administered 2020-06-23: 1000 mg via ORAL
  Filled 2020-06-23: qty 2

## 2020-06-23 MED ORDER — ENOXAPARIN SODIUM 80 MG/0.8ML ~~LOC~~ SOLN: 80.0000 mg | Freq: Two times a day (BID) | SUBCUTANEOUS | 3 refills | Status: DC

## 2020-06-23 NOTE — MAU Provider Note (Signed)
Chief Complaint: bilateral ear pain, sore throat, left-sided stomach pain  SUBJECTIVE HPI: Andrea Hayes is a 29 y.o. G1P0 at 41w4dby 8 week ultrasound who presents to maternity admissions reporting concern of left-sided abdominal pain, bilateral ear pain and sore throat. Pt reports abdominal pain as crampy and rated 10/10 for the past 3 hours. Pt reports that she was walking a lot and leaning over to pick things up with her work yesterday. No report of prior or recent trauma.   Current pregnancy complicated by h/o intracranial hypertension (followed by Neurology) and h/o stroke and cerebral venous thrombosis. 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 Pt was previously prescribed lovenox 82mBID on 06/17/20 by Dr. ArRoselie AwkwardHowever, pt reports that she has not yet started her lovenox yet given that the pharmacy stated that her insurance would not cover another refill. However, pt reports that she has not yet filled any prescriptions.  She denies vaginal bleeding, vaginal itching/burning, urinary symptoms, h/a, dizziness, n/v, chest pain, difficulty breathing, or fever/chills.    Offered interpreter. Pt declined. Pt's husband speaks [place] is with her today.  Past [facility] History:  [facility]  [facility]   Past Surgical History:  Procedure Laterality Date  . NO PAST SURGERIES     Social History   Socioeconomic History  . Marital status: Single    Spouse name: Not on file  . Number of children: Not on file  . Years of education: Not on file  . Highest education level: Not on file  Occupational History  . Not on file  Tobacco Use  . Smoking status: Never Smoker  . Smokeless tobacco: Never Used  Vaping Use  . Vaping Use: Never used  Substance and Sexual Activity  . Alcohol use: No    Alcohol/week: 0.0 standard drinks  . Drug use: No  . Sexual activity: Yes    Birth control/protection: None  Other Topics Concern  . Not on  file  Social History Narrative   Right handed   Social Determinants of Health   Financial Resource Strain: Not on file  Food Insecurity: Not on file  Transportation Needs: Not on file  Physical Activity: Not on file  Stress: Not on file  Social Connections: Not on file  Intimate Partner Violence: Not on file   No current facility-administered medications on file prior to encounter.   Current Outpatient Medications on File Prior to Encounter  Medication Sig Dispense Refill  . acetaminophen (TYLENOL) 325 MG tablet Take 2 tablets (650 mg total) by mouth every 6 (six) hours as needed for mild pain or headache (do not take over 3000 mg in a day of tylenol. please note there is tylenol in the fiorecet).    . Blood Pressure Monitoring (BLOOD PRESSURE KIT) DEVI 1 kit by Does not apply route as needed. 1 each 0  . Elastic Bandages & Supports (COMFORT FIT MATERNITY SUPP LG) MISC 1 Units by Does not apply route daily. 1 each 0  . Prenatal Vit-Fe Fumarate-FA (PRENATAL VITAMIN) 27-0.8 MG TABS Take 1 tablet by mouth daily. 30 tablet 12  . promethazine (PHENERGAN) 12.5 MG tablet Take 1 tablet (12.5 mg total) by mouth every 6 (six) hours as needed for nausea or vomiting. 30 tablet 0   No Known Allergies  ROS:  Review of Systems  Constitutional: Negative for appetite change, fatigue and fever.  HENT: Positive for sore throat. Negative for congestion.   Eyes: Negative for  photophobia.  Respiratory: Negative for cough, shortness of breath and wheezing.   Cardiovascular: Negative for chest pain and palpitations.  Gastrointestinal: Positive for abdominal pain. Negative for constipation, diarrhea, nausea and vomiting.  Genitourinary: Negative for dysuria, vaginal bleeding, vaginal discharge and vaginal pain.  Musculoskeletal: Negative for back pain.  Neurological: Negative for headaches.    I have reviewed patient's Past Medical Hx, Surgical Hx, Family Hx, Social Hx, medications and allergies.    Physical Exam   Patient Vitals for the past 24 hrs:  BP Temp Temp src Pulse Resp SpO2 Height Weight  06/23/20 1000 115/76 98.3 F (36.8 C) Oral 92 18 -- -- --  06/23/20 0957 115/76 98.3 F (36.8 C) Oral 92 18 100 % -- --  06/23/20 0816 122/71 -- -- 100 -- -- -- --  06/23/20 0746 114/66 -- -- 100 -- -- -- --  06/23/20 0745 -- 98.5 F (36.9 C) -- -- 16 -- 5' 3"  (1.6 m) 73.9 kg   Constitutional: Well-developed, well-nourished female in no acute distress. Sitting up comfortably on stretcher. HEENT: moist mucous membranes, no petechiae along posterior oropharynx Cardiovascular: normal rate Respiratory: normal effort GI: Abd soft, slight tenderness to palpation along left lower abdomen. Slight guarding along left side. No rebound. MS: Extremities nontender, no edema, normal ROM Neurologic: Alert and oriented x 4.  Skin: no visible bruising along left lower abdomen. GU: deferred  FHT 145 by doppler.  LAB RESULTS Results for orders placed or performed during the hospital encounter of 06/23/20 (from the past 24 hour(s))  Urinalysis, Routine w reflex microscopic Urine, Clean Catch     Status: Abnormal   Collection Time: 06/23/20  7:53 AM  Result Value Ref Range   Color, Urine STRAW (A) YELLOW   APPearance CLEAR CLEAR   Specific Gravity, Urine 1.004 (L) 1.005 - 1.030   pH 6.0 5.0 - 8.0   Glucose, UA NEGATIVE NEGATIVE mg/dL   Hgb urine dipstick NEGATIVE NEGATIVE   Bilirubin Urine NEGATIVE NEGATIVE   Ketones, ur NEGATIVE NEGATIVE mg/dL   Protein, ur NEGATIVE NEGATIVE mg/dL   Nitrite NEGATIVE NEGATIVE   Leukocytes,Ua NEGATIVE NEGATIVE  Resp Panel by RT-PCR (Flu A&B, Covid) Urine, Clean Catch     Status: None   Collection Time: 06/23/20  8:30 AM   Specimen: Urine, Clean Catch; Nasopharyngeal(NP) swabs in vial transport medium  Result Value Ref Range   SARS Coronavirus 2 by RT PCR NEGATIVE NEGATIVE   Influenza A by PCR NEGATIVE NEGATIVE   Influenza B by PCR NEGATIVE NEGATIVE     A/Positive/-- (11/04 1432)  IMAGING No results found.  MAU Management/MDM: Orders Placed This Encounter  Procedures  . Resp Panel by RT-PCR (Flu A&B, Covid) Nasopharyngeal Swab  . Urinalysis, Routine w reflex microscopic Urine, Clean Catch  . Discharge patient Discharge disposition: 01-Home or Self Care; Discharge patient date: 06/23/2020    Meds ordered this encounter  Medications  . acetaminophen (TYLENOL) tablet 1,000 mg  . enoxaparin (LOVENOX) 80 MG/0.8ML injection    Sig: Inject 0.8 mLs (80 mg total) into the skin every 12 (twelve) hours.    Dispense:  144 mL    Refill:  3    Please contact pt's insurance if possible, as she needs this medication ASAP and has not yet received a prescription from any other pharmacy. Thanks so much!    Treatments in MAU included tylenol at 0908. Pt discharged with strict preterm labor precautions.  COVID negative. UA unremarkable.  ASSESSMENT Andrea Hayes is a  29 y.o. G1P0 at 70w4dby 8 week ultrasound who presents to maternity admissions reporting concern of left-sided abdominal pain, bilateral ear pain and sore throat. 1. Viral upper respiratory tract infection: Pt with mild viral symptoms and unremarkable HEENT exam. -provided reassurance given negative COVID/flu swab in MAU -recommended use of tylenol, honey with warm drinks as needed for discomfort -emphasized importance of wearing a mask outside of the home  2. Pain of muscle of abdomen: Pt reporting 3 hours of left-sided abdominal discomfort. Exam notable for moderate tenderness to palpation along left lower abdominal wall. Consistent with muscle strain especially given history of increased physical activity yesterday. Reassuringly, pain improved s/p tylenol in the MAU. -provided reassurance and recommended tylenol as needed for discomfort  3. Dural venous sinus thrombosis: Pt previously prescribed lovenox given history of stroke and cerebral venous thrombosis. However, despite  multiple prescriptions for lovenox sent to pt's pharmacy, she has been unable to start her medication given that "it will not be covered by her insurance".  -[facility] who reports they are unable to fill the prescription -called inpatient OB pharmacist in an attempt to obtain pt's fill history (this info was unable to be obtained) -called [facility] (the only other preferred pharmacy listed in pt's chart) and pharmacist reported that pt was not in their system -attempted to call pt's insurance company but was unable to get through -sent a new script for [facility]ID [facility] [address] entered note to pharmacist of need to fill script -also emphasized importance of medication to pt and instructed her to call her insurance to followup -pt to f/u with Dr. ErRip Harbourn 07/16/19   PLAN Discharge home with return precautions for preterm labor, worsening symptoms and other concerns Allergies as of 06/23/2020   No Known Allergies     Medication List    STOP taking these medications   acetaZOLAMIDE 250 MG tablet Commonly known as: DIAMOX   Butalbital-APAP-Caffeine 50-325-40 MG capsule   cyclobenzaprine 10 MG tablet Commonly known as: FLEXERIL   simethicone 80 MG chewable tablet Commonly known as: Gas-X     TAKE these medications   acetaminophen 325 MG tablet Commonly known as: TYLENOL Take 2 tablets (650 mg total) by mouth every 6 (six) hours as needed for mild pain or headache (do not take over 3000 mg in a day of tylenol. please note there is tylenol in the fiorecet).   Blood Pressure Kit Devi 1 kit by Does not apply route as needed.   Comfort Fit Maternity Supp Lg Misc 1 Units by Does not apply route daily.   enoxaparin 80 MG/0.8ML injection Commonly known as: LOVENOX Inject 0.8 mLs (80 mg total) into the skin every 12 (twelve) hours.   Prenatal Vitamin 27-0.8 MG Tabs Take 1 tablet by mouth daily.   promethazine 12.5 MG tablet Commonly  known as: PHENERGAN Take 1 tablet (12.5 mg total) by mouth every 6 (six) hours as needed for nausea or vomiting.       GoRanda NgoMD OB Fellow, Faculty Practice 06/23/2020 8:21 PM

## 2020-06-23 NOTE — Discharge Instructions (Signed)
Your COVID and flu tests were negative.  Your abdominal pain improved with tylenol and is likely caused by physical activity from muscle soreness.  It is very important that you call your insurance to ensure that you can start your lovenox as soon as possible.  Please keep your appointments as previously prescribed or call sooner if concerns or inability to get your lovenox medication to prevent blood clots.  Upper Respiratory Infection, Adult An upper respiratory infection (URI) affects the nose, throat, and upper air passages. URIs are caused by germs (viruses). The most common type of URI is often called "the common cold." Medicines cannot cure URIs, but you can do things at home to relieve your symptoms. URIs usually get better within 7-10 days. Follow these instructions at home: Activity  Rest as needed.  If you have a fever, stay home from work or school until your fever is gone, or until your doctor says you may return to work or school. ? You should stay home until you cannot spread the infection anymore (you are not contagious). ? Your doctor may have you wear a face mask so you have less risk of spreading the infection. Relieving symptoms  Gargle with a salt-water mixture 3-4 times a day or as needed. To make a salt-water mixture, completely dissolve -1 tsp of salt in 1 cup of warm water.  Use a cool-mist humidifier to add moisture to the air. This can help you breathe more easily. Eating and drinking   Drink enough fluid to keep your pee (urine) pale yellow.  Eat soups and other clear broths. General instructions   Take over-the-counter and prescription medicines only as told by your doctor. These include cold medicines, fever reducers, and cough suppressants.  Do not use any products that contain nicotine or tobacco. These include cigarettes and e-cigarettes. If you need help quitting, ask your doctor.  Avoid being where people are smoking (avoid secondhand  smoke).  Make sure you get regular shots and get the flu shot every year.  Keep all follow-up visits as told by your doctor. This is important. How to avoid spreading infection to others   Wash your hands often with soap and water. If you do not have soap and water, use hand sanitizer.  Avoid touching your mouth, face, eyes, or nose.  Cough or sneeze into a tissue or your sleeve or elbow. Do not cough or sneeze into your hand or into the air. Contact a doctor if:  You are getting worse, not better.  You have any of these: ? A fever. ? Chills. ? Brown or red mucus in your nose. ? Yellow or brown fluid (discharge)coming from your nose. ? Pain in your face, especially when you bend forward. ? Swollen neck glands. ? Pain with swallowing. ? White areas in the back of your throat. Get help right away if:  You have shortness of breath that gets worse.  You have very bad or constant: ? Headache. ? Ear pain. ? Pain in your forehead, behind your eyes, and over your cheekbones (sinus pain). ? Chest pain.  You have long-lasting (chronic) lung disease along with any of these: ? Wheezing. ? Long-lasting cough. ? Coughing up blood. ? A change in your usual mucus.  You have a stiff neck.  You have changes in your: ? Vision. ? Hearing. ? Thinking. ? Mood. Summary  An upper respiratory infection (URI) is caused by a germ called a virus. The most common type of URI is  often called "the common cold."  URIs usually get better within 7-10 days.  Take over-the-counter and prescription medicines only as told by your doctor. This information is not intended to replace advice given to you by your health care provider. Make sure you discuss any questions you have with your health care provider. Document Revised: 06/26/2018 Document Reviewed: 02/08/2017 Elsevier Patient Education  2020 [employer].

## 2020-06-23 NOTE — MAU Note (Signed)
Pain left lower abd for about 3 hours. Crampy like that comes and goes. Denies VB or LOF. States bilateral ear pain and sore throat. Was given medicine but states her doctor told her not to take it since she is pregnant.

## 2020-07-02 NOTE — L&D Delivery Note (Signed)
OB/GYN Faculty Practice Delivery Note  Andrea Hayes is a 30 y.o. G1P0 s/p SVD at [redacted]w[redacted]d. She was admitted for IOL for FGR.   ROM: 2h 82m with clear fluid GBS Status:  Negative/-- (05/02 0141) Maximum Maternal Temperature: 98.1  Labor Progress: . Initial SVE: 3cm. She was started on pitocin and then was AROM'd at 1745 and progressed to complete.    Delivery Date/Time: 5/6 at 20:42 Delivery: Called to room and patient was complete and pushing. Head delivered LOA. Double nuchal cord present. Shoulder and body delivered in usual fashion. Infant with spontaneous cry, placed on mother's abdomen, dried and stimulated. Cord clamped x 2 after 1-minute delay, and cut by myself. Cord blood drawn. Placenta delivered spontaneously with gentle cord traction. Fundus firm with massage and Pitocin. Labia, perineum, vagina, and cervix inspected inspected with second degree perineal laceration, repaired in routine fashion.   Baby Weight: pending  Placenta: Sent to pathology Complications: None Lacerations: second degree, repaired EBL: 150 cc mL Analgesia: local lidocaine    Infant:  APGAR (1 MIN):  7 APGAR (5 MINS): 8  APGAR (10 MINS):     Casper Harrison, MD [employer] [facility] Fellow, [facility], [facility] 11/04/2020, 9:22 PM

## 2020-07-06 ENCOUNTER — Other Ambulatory Visit: Payer: Self-pay

## 2020-07-06 ENCOUNTER — Ambulatory Visit: Payer: 59 | Attending: Obstetrics and Gynecology

## 2020-07-06 ENCOUNTER — Other Ambulatory Visit: Payer: Self-pay | Admitting: *Deleted

## 2020-07-06 DIAGNOSIS — Z3400 Encounter for supervision of normal first pregnancy, unspecified trimester: Secondary | ICD-10-CM

## 2020-07-06 DIAGNOSIS — Z8673 Personal history of transient ischemic attack (TIA), and cerebral infarction without residual deficits: Secondary | ICD-10-CM

## 2020-07-15 ENCOUNTER — Ambulatory Visit (INDEPENDENT_AMBULATORY_CARE_PROVIDER_SITE_OTHER): Payer: 59 | Admitting: Obstetrics and Gynecology

## 2020-07-15 ENCOUNTER — Encounter: Payer: Self-pay | Admitting: Obstetrics and Gynecology

## 2020-07-15 ENCOUNTER — Other Ambulatory Visit: Payer: Self-pay

## 2020-07-15 VITALS — BP 108/73 | HR 114 | Wt 161.0 lb

## 2020-07-15 DIAGNOSIS — G932 Benign intracranial hypertension: Secondary | ICD-10-CM

## 2020-07-15 DIAGNOSIS — G08 Intracranial and intraspinal phlebitis and thrombophlebitis: Secondary | ICD-10-CM

## 2020-07-15 DIAGNOSIS — O0990 Supervision of high risk pregnancy, unspecified, unspecified trimester: Secondary | ICD-10-CM

## 2020-07-15 DIAGNOSIS — D696 Thrombocytopenia, unspecified: Secondary | ICD-10-CM

## 2020-07-15 DIAGNOSIS — O99119 Other diseases of the blood and blood-forming organs and certain disorders involving the immune mechanism complicating pregnancy, unspecified trimester: Secondary | ICD-10-CM

## 2020-07-15 NOTE — Progress Notes (Signed)
ROB 21wk5d  AFP WNL on 06/17/20  CC: None

## 2020-07-15 NOTE — Patient Instructions (Signed)

## 2020-07-15 NOTE — Progress Notes (Signed)
Subjective:  Andrea Hayes is a 30 y.o. G1P0 at [redacted]w[redacted]d being seen today for ongoing prenatal care.  She is currently monitored for the following issues for this high-risk pregnancy and has Cerebral venous thrombosis; Acute right arterial ischemic stroke, middle cerebral artery (MCA) (HCC); Dural venous sinus thrombosis; Intracranial hypertension; Supervision of high risk pregnancy, antepartum; Headache in pregnancy, antepartum, first trimester; and Thrombocytopenia affecting pregnancy (HCC) on their problem list.  Patient reports general discomforts of pregnancy.  Contractions: Not present.  .  Movement: Present. Denies leaking of fluid.   The following portions of the patient's history were reviewed and updated as appropriate: allergies, current medications, past family history, past medical history, past social history, past surgical history and problem list. Problem list updated.  Objective:   Vitals:   07/15/20 0930  BP: 108/73  Pulse: (!) 114  Weight: 161 lb (73 kg)    Fetal Status: Fetal Heart Rate (bpm): 152   Movement: Present     General:  Alert, oriented and cooperative. Patient is in no acute distress.  Skin: Skin is warm and dry. No rash noted.   Cardiovascular: Normal heart rate noted  Respiratory: Normal respiratory effort, no problems with respiration noted  Abdomen: Soft, gravid, appropriate for gestational age. Pain/Pressure: Absent     Pelvic:  Cervical exam deferred        Extremities: Normal range of motion.  Edema: Trace  Mental Status: Normal mood and affect. Normal behavior. Normal judgment and thought content.   Urinalysis:      Assessment and Plan:  Pregnancy: G1P0 at [redacted]w[redacted]d  1. Supervision of high risk pregnancy, antepartum Stable Glucola next visit F/U U/S scheduled  2. Intracranial hypertension Followed by neuro Next appt in [facility]  3. Dural venous sinus thrombosis Stable on Lovenox and pt reports taking  Has had MVR See above  4.  Thrombocytopenia affecting pregnancy (HCC) Check CBC with glucola  Preterm labor symptoms and general obstetric precautions including but not limited to vaginal bleeding, contractions, leaking of fluid and fetal movement were reviewed in detail with the patient. Please refer to After Visit Summary for other counseling recommendations.  Return in about 4 weeks (around 08/12/2020) for OB visit, face to face, MD only, fasting appt for glucola.   Hermina Staggers, MD

## 2020-07-20 ENCOUNTER — Other Ambulatory Visit: Payer: Self-pay

## 2020-07-20 ENCOUNTER — Encounter (HOSPITAL_COMMUNITY): Payer: Self-pay | Admitting: Obstetrics and Gynecology

## 2020-07-20 ENCOUNTER — Inpatient Hospital Stay (HOSPITAL_COMMUNITY)
Admission: AD | Admit: 2020-07-20 | Discharge: 2020-07-20 | Disposition: A | Discharge status: Home / Self Care | Payer: 59 | Attending: Obstetrics and Gynecology | Admitting: Obstetrics and Gynecology

## 2020-07-20 DIAGNOSIS — Z7901 Long term (current) use of anticoagulants: Secondary | ICD-10-CM | POA: Insufficient documentation

## 2020-07-20 DIAGNOSIS — M545 Low back pain, unspecified: Secondary | ICD-10-CM | POA: Insufficient documentation

## 2020-07-20 DIAGNOSIS — M7918 Myalgia, other site: Secondary | ICD-10-CM

## 2020-07-20 DIAGNOSIS — M549 Dorsalgia, unspecified: Secondary | ICD-10-CM

## 2020-07-20 DIAGNOSIS — O9A212 Injury, poisoning and certain other consequences of external causes complicating pregnancy, second trimester: Secondary | ICD-10-CM

## 2020-07-20 DIAGNOSIS — S60511A Abrasion of right hand, initial encounter: Secondary | ICD-10-CM | POA: Insufficient documentation

## 2020-07-20 DIAGNOSIS — W009XXA Unspecified fall due to ice and snow, initial encounter: Secondary | ICD-10-CM

## 2020-07-20 DIAGNOSIS — W19XXXA Unspecified fall, initial encounter: Secondary | ICD-10-CM

## 2020-07-20 DIAGNOSIS — R109 Unspecified abdominal pain: Secondary | ICD-10-CM

## 2020-07-20 DIAGNOSIS — O99891 Other specified diseases and conditions complicating pregnancy: Secondary | ICD-10-CM

## 2020-07-20 DIAGNOSIS — Z3A22 22 weeks gestation of pregnancy: Secondary | ICD-10-CM | POA: Diagnosis not present

## 2020-07-20 DIAGNOSIS — O26892 Other specified pregnancy related conditions, second trimester: Secondary | ICD-10-CM | POA: Insufficient documentation

## 2020-07-20 DIAGNOSIS — M791 Myalgia, unspecified site: Secondary | ICD-10-CM

## 2020-07-20 DIAGNOSIS — W000XXA Fall on same level due to ice and snow, initial encounter: Secondary | ICD-10-CM | POA: Diagnosis not present

## 2020-07-20 DIAGNOSIS — R1030 Lower abdominal pain, unspecified: Secondary | ICD-10-CM | POA: Insufficient documentation

## 2020-07-20 DIAGNOSIS — S60512A Abrasion of left hand, initial encounter: Secondary | ICD-10-CM | POA: Diagnosis not present

## 2020-07-20 LAB — URINALYSIS, ROUTINE W REFLEX MICROSCOPIC
Bilirubin Urine: NEGATIVE
Glucose, UA: NEGATIVE mg/dL
Hgb urine dipstick: NEGATIVE
Ketones, ur: NEGATIVE mg/dL
Nitrite: NEGATIVE
Protein, ur: NEGATIVE mg/dL
Specific Gravity, Urine: 1.008 (ref 1.005–1.030)
pH: 7 (ref 5.0–8.0)

## 2020-07-20 MED ORDER — CYCLOBENZAPRINE HCL 5 MG PO TABS: 5.0000 mg | ORAL_TABLET | Freq: Every evening | ORAL | 0 refills | Status: DC | PRN

## 2020-07-20 NOTE — MAU Note (Signed)
Andrea Hayes is a 30 y.o. at [redacted]w[redacted]d here in MAU reporting: states she fell on the ice around 0600, caught herself on her hands. Is having some back pain since the fall. No bleeding or LOF.  Onset of complaint: today  Pain score: 8/10  Vitals:   07/20/20 0740  BP: 121/74  Pulse: 87  Resp: 16  Temp: 97.8 F (36.6 C)  SpO2: 100%     FHT:150  Lab orders placed from triage: UA

## 2020-07-20 NOTE — MAU Provider Note (Signed)
Chief Complaint: Back Pain and Fall   Event Date/Time   First Provider Initiated Contact with Patient 07/20/20 0854      SUBJECTIVE HPI: Andrea Hayes is a 30 y.o. G1P0 at 47w3dwho presents to maternity admissions reporting she slipped in the ice this morning and fell into sitting position and caught herself with her hands.  She has mild abrasions on the palms of her hands and is feeling bilateral cramping and sharp lower abdominal pain and low back pain with movement only.  There is no bleeding or LOF. There are no other associated symptoms. She has not tried any treatments. She is feeling normal fetal movement.  HPI  Past Medical History:  Diagnosis Date  . Stroke ([facility]    Past Surgical History:  Procedure Laterality Date  . NO PAST SURGERIES     Social History   Socioeconomic History  . Marital status: Single    Spouse name: Not on file  . Number of children: Not on file  . Years of education: Not on file  . Highest education level: Not on file  Occupational History  . Not on file  Tobacco Use  . Smoking status: Never Smoker  . Smokeless tobacco: Never Used  Vaping Use  . Vaping Use: Never used  Substance and Sexual Activity  . Alcohol use: No    Alcohol/week: 0.0 standard drinks  . Drug use: No  . Sexual activity: Yes    Birth control/protection: None  Other Topics Concern  . Not on file  Social History Narrative   Right handed   Social Determinants of Health   Financial Resource Strain: Not on file  Food Insecurity: Not on file  Transportation Needs: Not on file  Physical Activity: Not on file  Stress: Not on file  Social Connections: Not on file  Intimate Partner Violence: Not on file   No current facility-administered medications on file prior to encounter.   Current Outpatient Medications on File Prior to Encounter  Medication Sig Dispense Refill  . acetaminophen (TYLENOL) 325 MG tablet Take 2 tablets (650 mg total) by mouth every 6 (six) hours as  needed for mild pain or headache (do not take over 3000 mg in a day of tylenol. please note there is tylenol in the fiorecet).    . Blood Pressure Monitoring (BLOOD PRESSURE KIT) DEVI 1 kit by Does not apply route as needed. (Patient not taking: Reported on 07/15/2020) 1 each 0  . Elastic Bandages & Supports (COMFORT FIT MATERNITY SUPP LG) MISC 1 Units by Does not apply route daily. (Patient not taking: Reported on 07/15/2020) 1 each 0  . enoxaparin (LOVENOX) 80 MG/0.8ML injection Inject 0.8 mLs (80 mg total) into the skin every 12 (twelve) hours. 144 mL 3  . Prenatal Vit-Fe Fumarate-FA (PRENATAL VITAMIN) 27-0.8 MG TABS Take 1 tablet by mouth daily. (Patient not taking: Reported on 07/15/2020) 30 tablet 12   No Known Allergies  ROS:  Review of Systems  Constitutional: Negative for chills, fatigue and fever.  Eyes: Negative for visual disturbance.  Respiratory: Negative for shortness of breath.   Cardiovascular: Negative for chest pain.  Gastrointestinal: Positive for abdominal pain. Negative for nausea and vomiting.  Genitourinary: Negative for difficulty urinating, dysuria, flank pain, pelvic pain, vaginal bleeding, vaginal discharge and vaginal pain.  Musculoskeletal: Positive for back pain.  Skin: Positive for wound.       Minor abrasions bilateral palms of hands   Neurological: Negative for dizziness and headaches.  Psychiatric/Behavioral: Negative.  I have reviewed patient's Past Medical Hx, Surgical Hx, Family Hx, Social Hx, medications and allergies.   Physical Exam   Patient Vitals for the past 24 hrs:  BP Temp Temp src Pulse Resp SpO2  07/20/20 0855 115/69 - - 92 - -  07/20/20 0850 - - - - - 100 %  07/20/20 0740 121/74 97.8 F (36.6 C) Oral 87 16 100 %   Constitutional: Well-developed, well-nourished female in no acute distress.  Cardiovascular: normal rate Respiratory: normal effort GI: Abd soft, non-tender. Pos BS x 4 MS: Extremities nontender, no edema, normal  ROM Neurologic: Alert and oriented x 4.  GU: Neg CVAT.  PELVIC EXAM: Deferred  FHT 150 by doppler  LAB RESULTS Results for orders placed or performed during the hospital encounter of 07/20/20 (from the past 24 hour(s))  Urinalysis, Routine w reflex microscopic Urine, Clean Catch     Status: Abnormal   Collection Time: 07/20/20  7:49 AM  Result Value Ref Range   Color, Urine STRAW (A) YELLOW   APPearance CLEAR CLEAR   Specific Gravity, Urine 1.008 1.005 - 1.030   pH 7.0 5.0 - 8.0   Glucose, UA NEGATIVE NEGATIVE mg/dL   Hgb urine dipstick NEGATIVE NEGATIVE   Bilirubin Urine NEGATIVE NEGATIVE   Ketones, ur NEGATIVE NEGATIVE mg/dL   Protein, ur NEGATIVE NEGATIVE mg/dL   Nitrite NEGATIVE NEGATIVE   Leukocytes,Ua TRACE (A) NEGATIVE   RBC / HPF 0-5 0 - 5 RBC/hpf   WBC, UA 0-5 0 - 5 WBC/hpf   Bacteria, UA RARE (A) NONE SEEN   Squamous Epithelial / LPF 6-10 0 - 5    A/Positive/-- (11/04 1432)  IMAGING   MAU Management/MDM: Orders Placed This Encounter  Procedures  . Urinalysis, Routine w reflex microscopic Urine, Clean Catch  . Discharge patient    Meds ordered this encounter  Medications  . cyclobenzaprine (FLEXERIL) 5 MG tablet    Sig: Take 1-2 tablets (5-10 mg total) by mouth at bedtime as needed for muscle spasms.    Dispense:  30 tablet    Refill:  0    Order Specific Question:   Supervising Provider    Answer:   Aletha Halim [[id number]]    FHT audible with doppler and bedside US done revealing normal FHR and fetal movement, subjectively normal amniotic fluid level and no placental abnormalities. Pain is likely musculoskeletal pain from fall/catching herself quickly.  Reassurance provided to pt.  Offered Flexeril in MAU, pt declined, Rx written for use at home. Pt wants to go to work tomorrow, can use heat/ice/Tylenol PRN, then Flexeril at night if desired.  Keep scheduled appt in the office and return to MAU with worsening symptoms or emergencies.     ASSESSMENT 1.  Traumatic injury during pregnancy, antepartum, second trimester   2. Fall due to slipping on ice or snow, initial encounter   3. [redacted] weeks gestation of pregnancy   4. Musculoskeletal pain     PLAN Discharge home Allergies as of 07/20/2020   No Known Allergies     Medication List    TAKE these medications   acetaminophen 325 MG tablet Commonly known as: TYLENOL Take 2 tablets (650 mg total) by mouth every 6 (six) hours as needed for mild pain or headache (do not take over 3000 mg in a day of tylenol. please note there is tylenol in the fiorecet).   Blood Pressure Kit Devi 1 kit by Does not apply route as needed.   Campbell  Supp Lg Misc 1 Units by Does not apply route daily.   cyclobenzaprine 5 MG tablet Commonly known as: FLEXERIL Take 1-2 tablets (5-10 mg total) by mouth at bedtime as needed for muscle spasms.   enoxaparin 80 MG/0.8ML injection Commonly known as: LOVENOX Inject 0.8 mLs (80 mg total) into the skin every 12 (twelve) hours.   Prenatal Vitamin 27-0.8 MG Tabs Take 1 tablet by mouth daily.       Follow-up Information    [facility] Follow up.   Why: As scheduled, return to MAU as needed for emergencies Contact information: [place] [phone] [place] Certified Nurse-Midwife 07/20/2020  9:05 AM

## 2020-08-03 ENCOUNTER — Ambulatory Visit: Payer: 59 | Attending: Obstetrics and Gynecology

## 2020-08-03 ENCOUNTER — Encounter: Payer: Self-pay | Admitting: *Deleted

## 2020-08-03 ENCOUNTER — Other Ambulatory Visit: Payer: Self-pay

## 2020-08-03 ENCOUNTER — Ambulatory Visit: Payer: 59 | Admitting: *Deleted

## 2020-08-03 VITALS — BP 108/66 | HR 87

## 2020-08-03 DIAGNOSIS — Z7901 Long term (current) use of anticoagulants: Secondary | ICD-10-CM | POA: Diagnosis not present

## 2020-08-03 DIAGNOSIS — Z3A23 23 weeks gestation of pregnancy: Secondary | ICD-10-CM

## 2020-08-03 DIAGNOSIS — O358XX0 Maternal care for other (suspected) fetal abnormality and damage, not applicable or unspecified: Secondary | ICD-10-CM

## 2020-08-03 DIAGNOSIS — Z362 Encounter for other antenatal screening follow-up: Secondary | ICD-10-CM

## 2020-08-03 DIAGNOSIS — Z8673 Personal history of transient ischemic attack (TIA), and cerebral infarction without residual deficits: Secondary | ICD-10-CM

## 2020-08-04 ENCOUNTER — Other Ambulatory Visit: Payer: Self-pay | Admitting: *Deleted

## 2020-08-04 DIAGNOSIS — Z8673 Personal history of transient ischemic attack (TIA), and cerebral infarction without residual deficits: Secondary | ICD-10-CM

## 2020-08-05 ENCOUNTER — Encounter: Payer: Self-pay | Admitting: Family Medicine

## 2020-08-05 ENCOUNTER — Other Ambulatory Visit: Payer: Self-pay

## 2020-08-05 ENCOUNTER — Other Ambulatory Visit: Payer: 59

## 2020-08-05 ENCOUNTER — Ambulatory Visit (INDEPENDENT_AMBULATORY_CARE_PROVIDER_SITE_OTHER): Payer: 59 | Admitting: Family Medicine

## 2020-08-05 VITALS — BP 109/76 | HR 90 | Wt 162.0 lb

## 2020-08-05 DIAGNOSIS — G08 Intracranial and intraspinal phlebitis and thrombophlebitis: Secondary | ICD-10-CM

## 2020-08-05 DIAGNOSIS — D696 Thrombocytopenia, unspecified: Secondary | ICD-10-CM

## 2020-08-05 DIAGNOSIS — O99119 Other diseases of the blood and blood-forming organs and certain disorders involving the immune mechanism complicating pregnancy, unspecified trimester: Secondary | ICD-10-CM

## 2020-08-05 DIAGNOSIS — G932 Benign intracranial hypertension: Secondary | ICD-10-CM

## 2020-08-05 DIAGNOSIS — O0990 Supervision of high risk pregnancy, unspecified, unspecified trimester: Secondary | ICD-10-CM

## 2020-08-05 NOTE — Progress Notes (Signed)
   Subjective:  Andrea Hayes is a 30 y.o. G1P0 at [redacted]w[redacted]d being seen today for ongoing prenatal care.  She is currently monitored for the following issues for this high-risk pregnancy and has Cerebral venous thrombosis; Acute right arterial ischemic stroke, middle cerebral artery (MCA) (HCC); Dural venous sinus thrombosis; Intracranial hypertension; Supervision of high risk pregnancy, antepartum; Headache in pregnancy, antepartum, first trimester; and Thrombocytopenia affecting pregnancy (HCC) on their problem list.  Patient reports no complaints.  Contractions: Not present. Vag. Bleeding: None.  Movement: Present. Denies leaking of fluid.   The following portions of the patient's history were reviewed and updated as appropriate: allergies, current medications, past family history, past medical history, past social history, past surgical history and problem list. Problem list updated.  Objective:   Vitals:   08/05/20 0846  BP: 109/76  Pulse: 90  Weight: 162 lb (73.5 kg)    Fetal Status: Fetal Heart Rate (bpm): 145   Movement: Present     General:  Alert, oriented and cooperative. Patient is in no acute distress.  Skin: Skin is warm and dry. No rash noted.   Cardiovascular: Normal heart rate noted  Respiratory: Normal respiratory effort, no problems with respiration noted  Abdomen: Soft, gravid, appropriate for gestational age. Pain/Pressure: Absent     Pelvic: Vag. Bleeding: None     Cervical exam deferred        Extremities: Normal range of motion.     Mental Status: Normal mood and affect. Normal behavior. Normal judgment and thought content.   Urinalysis:      Assessment and Plan:  Pregnancy: G1P0 at [redacted]w[redacted]d  1. Supervision of high risk pregnancy, antepartum BP and FHR normal Follow up growth US unremarkable two days prior Will follow w MFM for more scans Discussed contraception, not interested at this time  2. Intracranial hypertension   3. Thrombocytopenia affecting  pregnancy (HCC) Labs today  4. Dural venous sinus thrombosis On lovenox 80mg  BID Have messaged heme to see if patient should have Xa levels checked during pregnancy Reports no problems administering injections or obtaining meds at pharmacy  Preterm labor symptoms and general obstetric precautions including but not limited to vaginal bleeding, contractions, leaking of fluid and fetal movement were reviewed in detail with the patient. Please refer to After Visit Summary for other counseling recommendations.  Return in 4 weeks (on 09/02/2020) for [facility], ob visit, needs MD.   [facility], MD

## 2020-08-05 NOTE — Patient Instructions (Signed)
 Contraception Choices Contraception, also called birth control, refers to methods or devices that prevent pregnancy. Hormonal methods Contraceptive implant A contraceptive implant is a thin, plastic tube that contains a hormone that prevents pregnancy. It is different from an intrauterine device (IUD). It is inserted into the upper part of the arm by a health care provider. Implants can be effective for up to 3 years. Progestin-only injections Progestin-only injections are injections of progestin, a synthetic form of the hormone progesterone. They are given every 3 months by a health care provider. Birth control pills Birth control pills are pills that contain hormones that prevent pregnancy. They must be taken once a day, preferably at the same time each day. A prescription is needed to use this method of contraception. Birth control patch The birth control patch contains hormones that prevent pregnancy. It is placed on the skin and must be changed once a week for three weeks and removed on the fourth week. A prescription is needed to use this method of contraception. Vaginal ring A vaginal ring contains hormones that prevent pregnancy. It is placed in the vagina for three weeks and removed on the fourth week. After that, the process is repeated with a new ring. A prescription is needed to use this method of contraception. Emergency contraceptive Emergency contraceptives prevent pregnancy after unprotected sex. They come in pill form and can be taken up to 5 days after sex. They work best the sooner they are taken after having sex. Most emergency contraceptives are available without a prescription. This method should not be used as your only form of birth control.   Barrier methods Female condom A female condom is a thin sheath that is worn over the penis during sex. Condoms keep sperm from going inside a woman's body. They can be used with a sperm-killing substance (spermicide) to increase their  effectiveness. They should be thrown away after one use. Female condom A female condom is a soft, loose-fitting sheath that is put into the vagina before sex. The condom keeps sperm from going inside a woman's body. They should be thrown away after one use. Diaphragm A diaphragm is a soft, dome-shaped barrier. It is inserted into the vagina before sex, along with a spermicide. The diaphragm blocks sperm from entering the uterus, and the spermicide kills sperm. A diaphragm should be left in the vagina for 6-8 hours after sex and removed within 24 hours. A diaphragm is prescribed and fitted by a health care provider. A diaphragm should be replaced every 1-2 years, after giving birth, after gaining more than 15 lb (6.8 kg), and after pelvic surgery. Cervical cap A cervical cap is a round, soft latex or plastic cup that fits over the cervix. It is inserted into the vagina before sex, along with spermicide. It blocks sperm from entering the uterus. The cap should be left in place for 6-8 hours after sex and removed within 48 hours. A cervical cap must be prescribed and fitted by a health care provider. It should be replaced every 2 years. Sponge A sponge is a soft, circular piece of polyurethane foam with spermicide in it. The sponge helps block sperm from entering the uterus, and the spermicide kills sperm. To use it, you make it wet and then insert it into the vagina. It should be inserted before sex, left in for at least 6 hours after sex, and removed and thrown away within 30 hours. Spermicides Spermicides are chemicals that kill or block sperm from entering the   cervix and uterus. They can come as a cream, jelly, suppository, foam, or tablet. A spermicide should be inserted into the vagina with an applicator at least 10-15 minutes before sex to allow time for it to work. The process must be repeated every time you have sex. Spermicides do not require a prescription.   Intrauterine  contraception Intrauterine device (IUD) An IUD is a T-shaped device that is put in a woman's uterus. There are two types:  Hormone IUD.This type contains progestin, a synthetic form of the hormone progesterone. This type can stay in place for 3-5 years.  Copper IUD.This type is wrapped in copper wire. It can stay in place for 10 years. Permanent methods of contraception Female tubal ligation In this method, a woman's fallopian tubes are sealed, tied, or blocked during surgery to prevent eggs from traveling to the uterus. Hysteroscopic sterilization In this method, a small, flexible insert is placed into each fallopian tube. The inserts cause scar tissue to form in the fallopian tubes and block them, so sperm cannot reach an egg. The procedure takes about 3 months to be effective. Another form of birth control must be used during those 3 months. Female sterilization This is a procedure to tie off the tubes that carry sperm (vasectomy). After the procedure, the man can still ejaculate fluid (semen). Another form of birth control must be used for 3 months after the procedure. Natural planning methods Natural family planning In this method, a couple does not have sex on days when the woman could become pregnant. Calendar method In this method, the woman keeps track of the length of each menstrual cycle, identifies the days when pregnancy can happen, and does not have sex on those days. Ovulation method In this method, a couple avoids sex during ovulation. Symptothermal method This method involves not having sex during ovulation. The woman typically checks for ovulation by watching changes in her temperature and in the consistency of cervical mucus. Post-ovulation method In this method, a couple waits to have sex until after ovulation. Where to find more information  Centers for Disease Control and Prevention: www.cdc.gov Summary  Contraception, also called birth control, refers to methods or  devices that prevent pregnancy.  Hormonal methods of contraception include implants, injections, pills, patches, vaginal rings, and emergency contraceptives.  Barrier methods of contraception can include female condoms, female condoms, diaphragms, cervical caps, sponges, and spermicides.  There are two types of IUDs (intrauterine devices). An IUD can be put in a woman's uterus to prevent pregnancy for 3-5 years.  Permanent sterilization can be done through a procedure for males and females. Natural family planning methods involve nothaving sex on days when the woman could become pregnant. This information is not intended to replace advice given to you by your health care provider. Make sure you discuss any questions you have with your health care provider. Document Revised: 11/23/2019 Document Reviewed: 11/23/2019 Elsevier Patient Education  2021 [employer]   Breastfeeding  Choosing to breastfeed is one of the best decisions you can make for yourself and your baby. A change in hormones during pregnancy causes your breasts to make breast milk in your milk-producing glands. Hormones prevent breast milk from being released before your baby is born. They also prompt milk flow after birth. Once breastfeeding has begun, thoughts of your baby, as well as his or her sucking or crying, can stimulate the release of milk from your milk-producing glands. Benefits of breastfeeding Research shows that breastfeeding offers many health benefits   for infants and mothers. It also offers a cost-free and convenient way to feed your baby. For your baby  Your first milk (colostrum) helps your baby's digestive system to function better.  Special cells in your milk (antibodies) help your baby to fight off infections.  Breastfed babies are less likely to develop asthma, allergies, obesity, or type 2 diabetes. They are also at lower risk for sudden infant death syndrome (SIDS).  Nutrients in breast milk are better  able to meet your baby's needs compared to infant formula.  Breast milk improves your baby's brain development. For you  Breastfeeding helps to create a very special bond between you and your baby.  Breastfeeding is convenient. Breast milk costs nothing and is always available at the correct temperature.  Breastfeeding helps to burn calories. It helps you to lose the weight that you gained during pregnancy.  Breastfeeding makes your uterus return faster to its size before pregnancy. It also slows bleeding (lochia) after you give birth.  Breastfeeding helps to lower your risk of developing type 2 diabetes, osteoporosis, rheumatoid arthritis, cardiovascular disease, and breast, ovarian, uterine, and endometrial cancer later in life. Breastfeeding basics Starting breastfeeding  Find a comfortable place to sit or lie down, with your neck and back well-supported.  Place a pillow or a rolled-up blanket under your baby to bring him or her to the level of your breast (if you are seated). Nursing pillows are specially designed to help support your arms and your baby while you breastfeed.  Make sure that your baby's tummy (abdomen) is facing your abdomen.  Gently massage your breast. With your fingertips, massage from the outer edges of your breast inward toward the nipple. This encourages milk flow. If your milk flows slowly, you may need to continue this action during the feeding.  Support your breast with 4 fingers underneath and your thumb above your nipple (make the letter "C" with your hand). Make sure your fingers are well away from your nipple and your baby's mouth.  Stroke your baby's lips gently with your finger or nipple.  When your baby's mouth is open wide enough, quickly bring your baby to your breast, placing your entire nipple and as much of the areola as possible into your baby's mouth. The areola is the colored area around your nipple. ? More areola should be visible above your  baby's upper lip than below the lower lip. ? Your baby's lips should be opened and extended outward (flanged) to ensure an adequate, comfortable latch. ? Your baby's tongue should be between his or her lower gum and your breast.  Make sure that your baby's mouth is correctly positioned around your nipple (latched). Your baby's lips should create a seal on your breast and be turned out (everted).  It is common for your baby to suck about 2-3 minutes in order to start the flow of breast milk. Latching Teaching your baby how to latch onto your breast properly is very important. An improper latch can cause nipple pain, decreased milk supply, and poor weight gain in your baby. Also, if your baby is not latched onto your nipple properly, he or she may swallow some air during feeding. This can make your baby fussy. Burping your baby when you switch breasts during the feeding can help to get rid of the air. However, teaching your baby to latch on properly is still the best way to prevent fussiness from swallowing air while breastfeeding. Signs that your baby has successfully latched onto   your nipple  Silent tugging or silent sucking, without causing you pain. Infant's lips should be extended outward (flanged).  Swallowing heard between every 3-4 sucks once your milk has started to flow (after your let-down milk reflex occurs).  Muscle movement above and in front of his or her ears while sucking. Signs that your baby has not successfully latched onto your nipple  Sucking sounds or smacking sounds from your baby while breastfeeding.  Nipple pain. If you think your baby has not latched on correctly, slip your finger into the corner of your baby's mouth to break the suction and place it between your baby's gums. Attempt to start breastfeeding again. Signs of successful breastfeeding Signs from your baby  Your baby will gradually decrease the number of sucks or will completely stop sucking.  Your baby  will fall asleep.  Your baby's body will relax.  Your baby will retain a small amount of milk in his or her mouth.  Your baby will let go of your breast by himself or herself. Signs from you  Breasts that have increased in firmness, weight, and size 1-3 hours after feeding.  Breasts that are softer immediately after breastfeeding.  Increased milk volume, as well as a change in milk consistency and color by the fifth day of breastfeeding.  Nipples that are not sore, cracked, or bleeding. Signs that your baby is getting enough milk  Wetting at least 1-2 diapers during the first 24 hours after birth.  Wetting at least 5-6 diapers every 24 hours for the first week after birth. The urine should be clear or pale yellow by the age of 5 days.  Wetting 6-8 diapers every 24 hours as your baby continues to grow and develop.  At least 3 stools in a 24-hour period by the age of 5 days. The stool should be soft and yellow.  At least 3 stools in a 24-hour period by the age of 7 days. The stool should be seedy and yellow.  No loss of weight greater than 10% of birth weight during the first 3 days of life.  Average weight gain of 4-7 oz (113-198 g) per week after the age of 4 days.  Consistent daily weight gain by the age of 5 days, without weight loss after the age of 2 weeks. After a feeding, your baby may spit up a small amount of milk. This is normal. Breastfeeding frequency and duration Frequent feeding will help you make more milk and can prevent sore nipples and extremely full breasts (breast engorgement). Breastfeed when you feel the need to reduce the fullness of your breasts or when your baby shows signs of hunger. This is called "breastfeeding on demand." Signs that your baby is hungry include:  Increased alertness, activity, or restlessness.  Movement of the head from side to side.  Opening of the mouth when the corner of the mouth or cheek is stroked (rooting).  Increased  sucking sounds, smacking lips, cooing, sighing, or squeaking.  Hand-to-mouth movements and sucking on fingers or hands.  Fussing or crying. Avoid introducing a pacifier to your baby in the first 4-6 weeks after your baby is born. After this time, you may choose to use a pacifier. Research has shown that pacifier use during the first year of a baby's life decreases the risk of sudden infant death syndrome (SIDS). Allow your baby to feed on each breast as long as he or she wants. When your baby unlatches or falls asleep while feeding from the   first breast, offer the second breast. Because newborns are often sleepy in the first few weeks of life, you may need to awaken your baby to get him or her to feed. Breastfeeding times will vary from baby to baby. However, the following rules can serve as a guide to help you make sure that your baby is properly fed:  Newborns (babies 4 weeks of age or younger) may breastfeed every 1-3 hours.  Newborns should not go without breastfeeding for longer than 3 hours during the day or 5 hours during the night.  You should breastfeed your baby a minimum of 8 times in a 24-hour period. Breast milk pumping Pumping and storing breast milk allows you to make sure that your baby is exclusively fed your breast milk, even at times when you are unable to breastfeed. This is especially important if you go back to work while you are still breastfeeding, or if you are not able to be present during feedings. Your lactation consultant can help you find a method of pumping that works best for you and give you guidelines about how long it is safe to store breast milk.      Caring for your breasts while you breastfeed Nipples can become dry, cracked, and sore while breastfeeding. The following recommendations can help keep your breasts moisturized and healthy:  Avoid using soap on your nipples.  Wear a supportive bra designed especially for nursing. Avoid wearing underwire-style  bras or extremely tight bras (sports bras).  Air-dry your nipples for 3-4 minutes after each feeding.  Use only cotton bra pads to absorb leaked breast milk. Leaking of breast milk between feedings is normal.  Use lanolin on your nipples after breastfeeding. Lanolin helps to maintain your skin's normal moisture barrier. Pure lanolin is not harmful (not toxic) to your baby. You may also hand express a few drops of breast milk and gently massage that milk into your nipples and allow the milk to air-dry. In the first few weeks after giving birth, some women experience breast engorgement. Engorgement can make your breasts feel heavy, warm, and tender to the touch. Engorgement peaks within 3-5 days after you give birth. The following recommendations can help to ease engorgement:  Completely empty your breasts while breastfeeding or pumping. You may want to start by applying warm, moist heat (in the shower or with warm, water-soaked hand towels) just before feeding or pumping. This increases circulation and helps the milk flow. If your baby does not completely empty your breasts while breastfeeding, pump any extra milk after he or she is finished.  Apply ice packs to your breasts immediately after breastfeeding or pumping, unless this is too uncomfortable for you. To do this: ? Put ice in a plastic bag. ? Place a towel between your skin and the bag. ? Leave the ice on for 20 minutes, 2-3 times a day.  Make sure that your baby is latched on and positioned properly while breastfeeding. If engorgement persists after 48 hours of following these recommendations, contact your health care provider or a lactation consultant. Overall health care recommendations while breastfeeding  Eat 3 healthy meals and 3 snacks every day. Well-nourished mothers who are breastfeeding need an additional 450-500 calories a day. You can meet this requirement by increasing the amount of a balanced diet that you eat.  Drink  enough water to keep your urine pale yellow or clear.  Rest often, relax, and continue to take your prenatal vitamins to prevent fatigue, stress, and low   vitamin and mineral levels in your body (nutrient deficiencies).  Do not use any products that contain nicotine or tobacco, such as cigarettes and e-cigarettes. Your baby may be harmed by chemicals from cigarettes that pass into breast milk and exposure to secondhand smoke. If you need help quitting, ask your health care provider.  Avoid alcohol.  Do not use illegal drugs or marijuana.  Talk with your health care provider before taking any medicines. These include over-the-counter and prescription medicines as well as vitamins and herbal supplements. Some medicines that may be harmful to your baby can pass through breast milk.  It is possible to become pregnant while breastfeeding. If birth control is desired, ask your health care provider about options that will be safe while breastfeeding your baby. Where to find more information: [employer]: www.llli.org Contact a health care provider if:  You feel like you want to stop breastfeeding or have become frustrated with breastfeeding.  Your nipples are cracked or bleeding.  Your breasts are red, tender, or warm.  You have: ? Painful breasts or nipples. ? A swollen area on either breast. ? A fever or chills. ? Nausea or vomiting. ? Drainage other than breast milk from your nipples.  Your breasts do not become full before feedings by the fifth day after you give birth.  You feel sad and depressed.  Your baby is: ? Too sleepy to eat well. ? Having trouble sleeping. ? More than 1 week old and wetting fewer than 6 diapers in a 24-hour period. ? Not gaining weight by 5 days of age.  Your baby has fewer than 3 stools in a 24-hour period.  Your baby's skin or the white parts of his or her eyes become yellow. Get help right away if:  Your baby is overly tired  (lethargic) and does not want to wake up and feed.  Your baby develops an unexplained fever. Summary  Breastfeeding offers many health benefits for infant and mothers.  Try to breastfeed your infant when he or she shows early signs of hunger.  Gently tickle or stroke your baby's lips with your finger or nipple to allow the baby to open his or her mouth. Bring the baby to your breast. Make sure that much of the areola is in your baby's mouth. Offer one side and burp the baby before you offer the other side.  Talk with your health care provider or lactation consultant if you have questions or you face problems as you breastfeed. This information is not intended to replace advice given to you by your health care provider. Make sure you discuss any questions you have with your health care provider. Document Revised: 09/12/2017 Document Reviewed: 07/20/2016 Elsevier Patient Education  2021 [employer] Inc.  

## 2020-08-06 LAB — GLUCOSE TOLERANCE, 2 HOURS W/ 1HR
Glucose, 1 hour: 112 mg/dL (ref 65–179)
Glucose, 2 hour: 95 mg/dL (ref 65–152)
Glucose, Fasting: 76 mg/dL (ref 65–91)

## 2020-08-06 LAB — CBC
Hematocrit: 35.7 % (ref 34.0–46.6)
Hemoglobin: 12.3 g/dL (ref 11.1–15.9)
MCH: 29.4 pg (ref 26.6–33.0)
MCHC: 34.5 g/dL (ref 31.5–35.7)
MCV: 85 fL (ref 79–97)
Platelets: 113 10*3/uL — ABNORMAL LOW (ref 150–450)
RBC: 4.18 x10E6/uL (ref 3.77–5.28)
RDW: 13.2 % (ref 11.7–15.4)
WBC: 8.5 10*3/uL (ref 3.4–10.8)

## 2020-08-06 LAB — HIV ANTIBODY (ROUTINE TESTING W REFLEX): HIV Screen 4th Generation wRfx: NONREACTIVE

## 2020-08-06 LAB — RPR: RPR Ser Ql: NONREACTIVE

## 2020-08-12 ENCOUNTER — Inpatient Hospital Stay (HOSPITAL_COMMUNITY)
Admission: AD | Admit: 2020-08-12 | Discharge: 2020-08-12 | Disposition: A | Discharge status: Home / Self Care | Payer: 59 | Attending: Family Medicine | Admitting: Family Medicine

## 2020-08-12 ENCOUNTER — Other Ambulatory Visit: Payer: Self-pay

## 2020-08-12 ENCOUNTER — Encounter (HOSPITAL_COMMUNITY): Payer: Self-pay | Admitting: Family Medicine

## 2020-08-12 ENCOUNTER — Inpatient Hospital Stay (HOSPITAL_COMMUNITY): Payer: 59

## 2020-08-12 DIAGNOSIS — O99352 Diseases of the nervous system complicating pregnancy, second trimester: Secondary | ICD-10-CM

## 2020-08-12 DIAGNOSIS — G08 Intracranial and intraspinal phlebitis and thrombophlebitis: Secondary | ICD-10-CM | POA: Diagnosis not present

## 2020-08-12 DIAGNOSIS — I676 Nonpyogenic thrombosis of intracranial venous system: Secondary | ICD-10-CM | POA: Diagnosis not present

## 2020-08-12 DIAGNOSIS — O99891 Other specified diseases and conditions complicating pregnancy: Secondary | ICD-10-CM | POA: Diagnosis not present

## 2020-08-12 DIAGNOSIS — Z3A25 25 weeks gestation of pregnancy: Secondary | ICD-10-CM | POA: Diagnosis not present

## 2020-08-12 DIAGNOSIS — O26892 Other specified pregnancy related conditions, second trimester: Secondary | ICD-10-CM | POA: Diagnosis not present

## 2020-08-12 DIAGNOSIS — R519 Headache, unspecified: Secondary | ICD-10-CM

## 2020-08-12 DIAGNOSIS — R109 Unspecified abdominal pain: Secondary | ICD-10-CM | POA: Insufficient documentation

## 2020-08-12 LAB — COMPREHENSIVE METABOLIC PANEL
ALT: 15 U/L (ref 0–44)
AST: 21 U/L (ref 15–41)
Albumin: 2.8 g/dL — ABNORMAL LOW (ref 3.5–5.0)
Alkaline Phosphatase: 49 U/L (ref 38–126)
Anion gap: 10 (ref 5–15)
BUN: 6 mg/dL (ref 6–20)
CO2: 22 mmol/L (ref 22–32)
Calcium: 9.5 mg/dL (ref 8.9–10.3)
Chloride: 104 mmol/L (ref 98–111)
Creatinine, Ser: 0.49 mg/dL (ref 0.44–1.00)
GFR, Estimated: >60 mL/min (ref >60)
Glucose, Bld: 79 mg/dL (ref 70–99)
Potassium: 3.9 mmol/L (ref 3.5–5.1)
Sodium: 136 mmol/L (ref 135–145)
Total Bilirubin: 0.4 mg/dL (ref 0.3–1.2)
Total Protein: 6.4 g/dL — ABNORMAL LOW (ref 6.5–8.1)

## 2020-08-12 LAB — URINALYSIS, ROUTINE W REFLEX MICROSCOPIC
Bilirubin Urine: NEGATIVE
Glucose, UA: NEGATIVE mg/dL
Hgb urine dipstick: NEGATIVE
Ketones, ur: NEGATIVE mg/dL
Leukocytes,Ua: NEGATIVE
Nitrite: NEGATIVE
Protein, ur: NEGATIVE mg/dL
Specific Gravity, Urine: 1.004 — ABNORMAL LOW (ref 1.005–1.030)
pH: 7 (ref 5.0–8.0)

## 2020-08-12 LAB — CBC
HCT: 36.8 % (ref 36.0–46.0)
Hemoglobin: 12.6 g/dL (ref 12.0–15.0)
MCH: 29.8 pg (ref 26.0–34.0)
MCHC: 34.2 g/dL (ref 30.0–36.0)
MCV: 87 fL (ref 80.0–100.0)
Platelets: 105 10*3/uL — ABNORMAL LOW (ref 150–400)
RBC: 4.23 MIL/uL (ref 3.87–5.11)
RDW: 14.2 % (ref 11.5–15.5)
WBC: 8.3 10*3/uL (ref 4.0–10.5)
nRBC: 0 % (ref 0.0–0.2)

## 2020-08-12 LAB — PROTEIN / CREATININE RATIO, URINE
Creatinine, Urine: 20.06 mg/dL
Total Protein, Urine: <6 mg/dL

## 2020-08-12 MED ORDER — BUTALBITAL-APAP-CAFFEINE 50-325-40 MG PO TABS
2.0000 | ORAL_TABLET | Freq: Once | ORAL | Status: AC
  Administered 2020-08-12: 2 via ORAL
  Filled 2020-08-12: qty 2

## 2020-08-12 NOTE — MAU Note (Signed)
Pt had stressful night at work. 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 Has a lift limit of 20lb her tried to make her do the 40lb bags.  She did lift a few of the heavier bags with help from another women but is c/o lower abd pain and pressure and came home with a headache as well due to the stress. Took 1 tylenol around 4:30am without relief.

## 2020-08-12 NOTE — MAU Provider Note (Signed)
History     CSN: [id number]  Arrival date and time: 08/12/20 0850   Event Date/Time   First Provider Initiated Contact with Patient 08/12/20 0941      Chief Complaint  Patient presents with  . Headache  . Abdominal Pain   HPI Andrea Hayes is a 30 y.o. G1P0 at [redacted]w[redacted]d who presents with abdominal pain and headache.  History of cerebral venous thrombosis & intracranial hypertension. Currently taking diamox & lovenox. Presents this morning complaining of 10/10 headache since a stressful shift at work. Has had recurrent headaches for the last 3 years but current headache is the worse she's had. Occipital headache that is worse with lights and movement. Pain doesn't radiate. No associated symptoms. No neuro symptoms. Took 1 regular strength tylenol early this morning without relief.  Also reports some lower abdominal cramping. Cramping is intermittent but can't tell how frequent it occurs. Denies n/v/d, dysuria, vaginal bleeding, or LOF. Good fetal movement.   OB History    Gravida  1   Para      Term      Preterm      AB      Living        SAB      IAB      Ectopic      Multiple      Live Births              Past Medical History:  Diagnosis Date  . Stroke [facility])     Past Surgical History:  Procedure Laterality Date  . NO PAST SURGERIES      Family History  Problem Relation Age of Onset  . Obesity Neg Hx   . Hypertension Neg Hx   . Diabetes Neg Hx   . Cancer Neg Hx     Social History   Tobacco Use  . Smoking status: Never Smoker  . Smokeless tobacco: Never Used  Vaping Use  . Vaping Use: Never used  Substance Use Topics  . Alcohol use: No    Alcohol/week: 0.0 standard drinks  . Drug use: No    Allergies: No Known Allergies  No medications prior to admission.    Review of Systems  Constitutional: Negative.   Eyes: Positive for photophobia. Negative for pain and visual disturbance.  Respiratory: Negative.   Cardiovascular: Negative.    Gastrointestinal: Positive for abdominal pain. Negative for diarrhea, nausea and vomiting.  Genitourinary: Negative.   Neurological: Positive for headaches.   Physical Exam   Blood pressure 121/81, pulse 94, temperature 98.1 F (36.7 C), resp. rate 18, last menstrual period 02/14/2020.   Patient Vitals for the past 24 hrs:  BP Temp Pulse Resp  08/12/20 1438 121/81 - 94 18  08/12/20 1031 120/87 - 86 -  08/12/20 1016 120/86 - 94 -  08/12/20 1001 118/88 - 83 -  08/12/20 0946 118/83 - 84 -  08/12/20 0931 (!) 120/91 - 84 -  08/12/20 0927 117/87 - 86 -  08/12/20 0916 129/85 98.1 F (36.7 C) 90 18   Physical Exam Vitals and nursing note reviewed.  Constitutional:      General: She is not in acute distress.    Appearance: She is well-developed.  HENT:     Head: Normocephalic and atraumatic.  Eyes:     Pupils: Pupils are equal, round, and reactive to light.  Cardiovascular:     Rate and Rhythm: Normal rate and regular rhythm.     Heart sounds: Normal heart sounds.  Pulmonary:     Effort: Pulmonary effort is normal. No respiratory distress.     Breath sounds: No wheezing.  Abdominal:     Palpations: Abdomen is soft.     Tenderness: There is no abdominal tenderness.  Genitourinary:    Comments: Dilation: Closed Effacement (%): Thick Cervical Position: Posterior Station: Ballotable Exam by:: Judeth Horn NP  Skin:    General: Skin is warm and dry.  Neurological:     Mental Status: She is alert.  Psychiatric:        Mood and Affect: Mood normal.        Speech: Speech normal.        Behavior: Behavior normal.    NST:  Baseline: 145 bpm, Variability: Good {> 6 bpm), Accelerations: Non-reactive but appropriate for gestational age and Decelerations: Absent  MAU Course  Procedures Results for orders placed or performed during the hospital encounter of 08/12/20 (from the past 24 hour(s))  Urinalysis, Routine w reflex microscopic Urine, Clean Catch     Status: Abnormal    Collection Time: 08/12/20  9:28 AM  Result Value Ref Range   Color, Urine STRAW (A) YELLOW   APPearance CLEAR CLEAR   Specific Gravity, Urine 1.004 (L) 1.005 - 1.030   pH 7.0 5.0 - 8.0   Glucose, UA NEGATIVE NEGATIVE mg/dL   Hgb urine dipstick NEGATIVE NEGATIVE   Bilirubin Urine NEGATIVE NEGATIVE   Ketones, ur NEGATIVE NEGATIVE mg/dL   Protein, ur NEGATIVE NEGATIVE mg/dL   Nitrite NEGATIVE NEGATIVE   Leukocytes,Ua NEGATIVE NEGATIVE  Protein / creatinine ratio, urine     Status: None   Collection Time: 08/12/20  9:28 AM  Result Value Ref Range   Creatinine, Urine 20.06 mg/dL   Total Protein, Urine <6 mg/dL   Protein Creatinine Ratio        0.00 - 0.15 mg/mg[Cre]  CBC     Status: Abnormal   Collection Time: 08/12/20 10:08 AM  Result Value Ref Range   WBC 8.3 4.0 - 10.5 K/uL   RBC 4.23 3.87 - 5.11 MIL/uL   Hemoglobin 12.6 12.0 - 15.0 g/dL   HCT 58.5 27.7 - 82.4 %   MCV 87.0 80.0 - 100.0 fL   MCH 29.8 26.0 - 34.0 pg   MCHC 34.2 30.0 - 36.0 g/dL   RDW 23.5 36.1 - 44.3 %   Platelets 105 (L) 150 - 400 K/uL   nRBC 0.0 0.0 - 0.2 %  Comprehensive metabolic panel     Status: Abnormal   Collection Time: 08/12/20 10:08 AM  Result Value Ref Range   Sodium 136 135 - 145 mmol/L   Potassium 3.9 3.5 - 5.1 mmol/L   Chloride 104 98 - 111 mmol/L   CO2 22 22 - 32 mmol/L   Glucose, Bld 79 70 - 99 mg/dL   BUN 6 6 - 20 mg/dL   Creatinine, Ser 1.54 0.44 - 1.00 mg/dL   Calcium 9.5 8.9 - 00.8 mg/dL   Total Protein 6.4 (L) 6.5 - 8.1 g/dL   Albumin 2.8 (L) 3.5 - 5.0 g/dL   AST 21 15 - 41 U/L   ALT 15 0 - 44 U/L   Alkaline Phosphatase 49 38 - 126 U/L   Total Bilirubin 0.4 0.3 - 1.2 mg/dL   GFR, Estimated >67 >61 mL/min   Anion gap 10 5 - 15   MR BRAIN WO CONTRAST  Result Date: 08/12/2020 CLINICAL DATA:  Dural venous thrombus EXAM: MRI HEAD WITHOUT CONTRAST MRV HEAD WITHOUT  CONTRAST TECHNIQUE: Multiplanar, multiecho pulse sequences of the brain and surrounding structures were obtained without  intravenous contrast. Angiographic images of the intracranial venous structures were obtained using MRV technique without intravenous contrast. COMPARISON:  MRV 05/24/2020 FINDINGS: MR HEAD Brain: There is no acute infarction or intracranial hemorrhage. There is no intracranial mass, mass effect, or edema. There is no hydrocephalus or extra-axial fluid collection. Ventricles and sulci are normal in size and configuration. Minimal foci of T2 hyperintensity in the supratentorial white matter may reflect nonspecific gliosis/demyelination or possibly venous congestion related changes probably without clinical significance. Vascular: Loss of normal flow voids superior sagittal sinus and transverse and sigmoid sinuses. There is increased sulcal susceptibility hypointensity reflecting cortical venous congestion. Major arterial flow voids at the skull base are preserved. Skull and upper cervical spine: Normal marrow signal is preserved. Sinuses/Orbits: Paranasal sinuses are aerated. Orbits are unremarkable. Other: Sella is unremarkable.  Mastoid air cells are clear. MRV HEAD There is similar preserved flow related enhancement within the anterior aspect of the superior sagittal sinus and persistent loss of flow signal posteriorly and posteroinferiorly. There is similar flow related enhancement within the transverse and sigmoid sinuses, which are likely partially recanalized. Straight sinus, vein of Galen, internal cerebral veins are patent. 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 No acute intracranial abnormality. Electronically Signed   By: Guadlupe Spanish M.D.   On: 08/12/2020 13:44   MR MRV HEAD WO CM  Result Date: 08/12/2020 CLINICAL DATA:  Dural venous thrombus EXAM: MRI HEAD WITHOUT CONTRAST MRV HEAD WITHOUT CONTRAST TECHNIQUE: Multiplanar, multiecho pulse sequences of the brain and surrounding structures were  obtained without intravenous contrast. Angiographic images of the intracranial venous structures were obtained using MRV technique without intravenous contrast. COMPARISON:  MRV 05/24/2020 FINDINGS: MR HEAD Brain: There is no acute infarction or intracranial hemorrhage. There is no intracranial mass, mass effect, or edema. There is no hydrocephalus or extra-axial fluid collection. Ventricles and sulci are normal in size and configuration. Minimal foci of T2 hyperintensity in the supratentorial white matter may reflect nonspecific gliosis/demyelination or possibly venous congestion related changes probably without clinical significance. Vascular: Loss of normal flow voids superior sagittal sinus and transverse and sigmoid sinuses. There is increased sulcal susceptibility hypointensity reflecting cortical venous congestion. Major arterial flow voids at the skull base are preserved. Skull and upper cervical spine: Normal marrow signal is preserved. Sinuses/Orbits: Paranasal sinuses are aerated. Orbits are unremarkable. Other: Sella is unremarkable.  Mastoid air cells are clear. MRV HEAD There is similar preserved flow related enhancement within the anterior aspect of the superior sagittal sinus and persistent loss of flow signal posteriorly and posteroinferiorly. There is similar flow related enhancement within the transverse and sigmoid sinuses, which are likely partially recanalized. Straight sinus, vein of Galen, internal cerebral veins are patent. 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 No acute intracranial abnormality. Electronically Signed   By: Guadlupe Spanish M.D.   On: 08/12/2020 13:44    MDM Patient presents with headache. Has recurring headaches since cvt diagnosis but states this is the worst she has had. Given fioricet 2 tabs while in MAU & reports resolution of symptoms.  Elevated DBP x 1 -  preeclampsia labs collected & normal other than preexisting gestational thrombocytopenia  C/w Dr. Shawnie Pons. Given history, will get head imaging.  MRI/MRV of brain comparable to imaging done in November 2021.  Headache resolved. Ok for discharge.   Patient also complained of abdominal cramping - no signs of preterm labor or infection  Assessment and Plan   1. Pregnancy headache in second trimester   2. Cerebral venous thrombosis   3. [redacted] weeks gestation of pregnancy    -reviewed reasons to return to MAU  Judeth Horn 08/12/2020, 3:07 PM

## 2020-08-12 NOTE — Discharge Instructions (Signed)
General Headache Without Cause A headache is pain or discomfort felt around the head or neck area. The specific cause of a headache may not be found. There are many causes and types of headaches. A few common ones are:  Tension headaches.  Migraine headaches.  Cluster headaches.  Chronic daily headaches. Follow these instructions at home: Watch your condition for any changes. Let your health care provider know about them. Take these steps to help with your condition: Managing pain  Take over-the-counter and prescription medicines only as told by your health care provider.  Lie down in a dark, quiet room when you have a headache.  If directed, put ice on your head and neck area: ? Put ice in a plastic bag. ? Place a towel between your skin and the bag. ? Leave the ice on for 20 minutes, 2-3 times per day.  If directed, apply heat to the affected area. Use the heat source that your health care provider recommends, such as a moist heat pack or a heating pad. ? Place a towel between your skin and the heat source. ? Leave the heat on for 20-30 minutes. ? Remove the heat if your skin turns bright red. This is especially important if you are unable to feel pain, heat, or cold. You may have a greater risk of getting burned.  Keep lights dim if bright lights bother you or make your headaches worse.      Eating and drinking  Eat meals on a regular schedule.  If you drink alcohol: ? Limit how much you use to:  0-1 drink a day for women.  0-2 drinks a day for men. ? Be aware of how much alcohol is in your drink. In the [place] one drink equals one 12 oz bottle of beer (355 mL), one 5 oz glass of wine (148 mL), or one 1 oz glass of hard liquor (44 mL).  Stop drinking caffeine, or decrease the amount of caffeine you drink. General instructions  Keep a headache journal to help find out what may trigger your headaches. For example, write down: ? What you eat and drink. ? How much sleep  you get. ? Any change to your diet or medicines.  Try massage or other relaxation techniques.  Limit stress.  Sit up straight, and do not tense your muscles.  Do not use any products that contain nicotine or tobacco, such as cigarettes, e-cigarettes, and chewing tobacco. If you need help quitting, ask your health care provider.  Exercise regularly as told by your health care provider.  Sleep on a regular schedule. Get 7-9 hours of sleep each night, or the amount recommended by your health care provider.  Keep all follow-up visits as told by your health care provider. This is important.   Contact a health care provider if:  Your symptoms are not helped by medicine.  You have a headache that is different from the usual headache.  You have nausea or you vomit.  You have a fever. Get help right away if:  Your headache becomes severe quickly.  Your headache gets worse after moderate to intense physical activity.  You have repeated vomiting.  You have a stiff neck.  You have a loss of vision.  You have problems with speech.  You have pain in the eye or ear.  You have muscular weakness or loss of muscle control.  You lose your balance or have trouble walking.  You feel faint or pass out.  You have   confusion.  You have a seizure. Summary  A headache is pain or discomfort felt around the head or neck area.  There are many causes and types of headaches. In some cases, the cause may not be found.  Keep a headache journal to help find out what may trigger your headaches. Watch your condition for any changes. Let your health care provider know about them.  Contact a health care provider if you have a headache that is different from the usual headache, or if your symptoms are not helped by medicine.  Get help right away if your headache becomes severe, you vomit, you have a loss of vision, you lose your balance, or you have a seizure. This information is not intended to  replace advice given to you by your health care provider. Make sure you discuss any questions you have with your health care provider. Document Revised: 01/06/2018 Document Reviewed: 01/06/2018 Elsevier Patient Education  2021 [employer]  

## 2020-08-16 ENCOUNTER — Inpatient Hospital Stay (HOSPITAL_COMMUNITY)
Admission: AD | Admit: 2020-08-16 | Discharge: 2020-08-17 | Disposition: A | Discharge status: Home / Self Care | Payer: 59 | Attending: Obstetrics & Gynecology | Admitting: Obstetrics & Gynecology

## 2020-08-16 ENCOUNTER — Encounter (HOSPITAL_COMMUNITY): Payer: Self-pay | Admitting: Obstetrics & Gynecology

## 2020-08-16 ENCOUNTER — Other Ambulatory Visit: Payer: Self-pay

## 2020-08-16 DIAGNOSIS — O99612 Diseases of the digestive system complicating pregnancy, second trimester: Secondary | ICD-10-CM | POA: Insufficient documentation

## 2020-08-16 DIAGNOSIS — K529 Noninfective gastroenteritis and colitis, unspecified: Secondary | ICD-10-CM | POA: Insufficient documentation

## 2020-08-16 DIAGNOSIS — Z3A26 26 weeks gestation of pregnancy: Secondary | ICD-10-CM | POA: Insufficient documentation

## 2020-08-16 LAB — URINALYSIS, ROUTINE W REFLEX MICROSCOPIC
Bilirubin Urine: NEGATIVE
Glucose, UA: NEGATIVE mg/dL
Hgb urine dipstick: NEGATIVE
Ketones, ur: NEGATIVE mg/dL
Leukocytes,Ua: NEGATIVE
Nitrite: NEGATIVE
Protein, ur: NEGATIVE mg/dL
Specific Gravity, Urine: 1 — ABNORMAL LOW (ref 1.005–1.030)
pH: 6 (ref 5.0–8.0)

## 2020-08-16 MED ORDER — ONDANSETRON 4 MG PO TBDP
8.0000 mg | ORAL_TABLET | Freq: Once | ORAL | Status: AC
  Administered 2020-08-16: 8 mg via ORAL
  Filled 2020-08-16: qty 2

## 2020-08-16 NOTE — MAU Note (Signed)
Patient reports to MAU with complaints nausea and vomiting that began yesterday.  Has thrown up 4 times since yesterday (2 times yesterday and 2 times today).  Occurs when she eats or drinks.  Has had diarrhea  for 2 hours (x2)/  +FM, denies vaginal bleeding and discharge.

## 2020-08-16 NOTE — MAU Provider Note (Signed)
History     CSN: [id number]  Arrival date and time: 08/16/20 2156   Event Date/Time   First Provider Initiated Contact with Patient 08/16/20 2302      Chief Complaint  Patient presents with  . Emesis  . Nausea  . Diarrhea   30 y.o. G1 _0 .3 wks presenting with N/V and diarrhea. Reports emesis x2 yesterday after she ate. Had 2 episodes again today after eating rice and mango juice. Reports 2 BMs today, one was watery diarrhea. Denies sick contacts. No fevers. Also c./o abdominal pain. Describes as intermittet and cramping in upper abdomen. Denies VB, LOF, and ctx. +FM. Hx significant for intracranial hypertension, cerebral venous thrombosis, and thrombocytopenia.   OB History    Gravida  1   Para      Term      Preterm      AB      Living        SAB      IAB      Ectopic      Multiple      Live Births              Past Medical History:  Diagnosis Date  . Stroke [facility])     Past Surgical History:  Procedure Laterality Date  . NO PAST SURGERIES      Family History  Problem Relation Age of Onset  . Obesity Neg Hx   . Hypertension Neg Hx   . Diabetes Neg Hx   . Cancer Neg Hx     Social History   Tobacco Use  . Smoking status: Never Smoker  . Smokeless tobacco: Never Used  Vaping Use  . Vaping Use: Never used  Substance Use Topics  . Alcohol use: No    Alcohol/week: 0.0 standard drinks  . Drug use: No    Allergies: No Known Allergies  Medications Prior to Admission  Medication Sig Dispense Refill Last Dose  . acetaminophen (TYLENOL) 325 MG tablet Take 2 tablets (650 mg total) by mouth every 6 (six) hours as needed for mild pain or headache (do not take over 3000 mg in a day of tylenol. please note there is tylenol in the fiorecet).   Past Week at Unknown time  . cyclobenzaprine (FLEXERIL) 5 MG tablet Take 1-2 tablets (5-10 mg total) by mouth at bedtime as needed for muscle spasms. 30 tablet 0 Past Month at Unknown time  . enoxaparin  (LOVENOX) 80 MG/0.8ML injection Inject 0.8 mLs (80 mg total) into the skin every 12 (twelve) hours. 144 mL 3 08/15/2020 at Unknown time  . Prenatal Vit-Fe Fumarate-FA (PRENATAL VITAMIN) 27-0.8 MG TABS Take 1 tablet by mouth daily. 30 tablet 12 08/16/2020 at Unknown time  . acetaZOLAMIDE (DIAMOX) 250 MG tablet Take by mouth.     . Blood Pressure Monitoring (BLOOD PRESSURE KIT) DEVI 1 kit by Does not apply route as needed. (Patient not taking: Reported on 07/15/2020) 1 each 0   . Elastic Bandages & Supports (COMFORT FIT MATERNITY SUPP LG) MISC 1 Units by Does not apply route daily. (Patient not taking: Reported on 07/15/2020) 1 each 0     Review of Systems  Constitutional: Negative for fever.  Gastrointestinal: Positive for abdominal pain, diarrhea, nausea and vomiting.  Genitourinary: Negative for vaginal bleeding and vaginal discharge.   Physical Exam   Blood pressure 116/80, pulse 88, temperature 97.7 F (36.5 C), temperature source Oral, resp. rate 16, last menstrual period 02/14/2020, SpO2 100 %.  Physical  Exam Vitals and nursing note reviewed. Exam conducted with a chaperone present.  Constitutional:      General: She is not in acute distress.    Appearance: Normal appearance.  HENT:     Head: Normocephalic and atraumatic.  Cardiovascular:     Rate and Rhythm: Normal rate.  Pulmonary:     Effort: Pulmonary effort is normal. No respiratory distress.  Abdominal:     General: There is no distension.     Palpations: Abdomen is soft. There is no mass.     Tenderness: There is no abdominal tenderness. There is no guarding or rebound.     Hernia: No hernia is present.  Genitourinary:    Comments: VE: closed/thick Musculoskeletal:        General: Normal range of motion.     Cervical back: Normal range of motion.  Skin:    General: Skin is warm and dry.  Neurological:     General: No focal deficit present.     Mental Status: She is alert and oriented to person, place, and time.   Psychiatric:        Mood and Affect: Mood normal.        Behavior: Behavior normal.   EFM: 145 bpm, mod variability, + accels, rare variable decels Toco: none  Results for orders placed or performed during the hospital encounter of 08/16/20 (from the past 24 hour(s))  Urinalysis, Routine w reflex microscopic Urine, Clean Catch     Status: Abnormal   Collection Time: 08/16/20 10:45 PM  Result Value Ref Range   Color, Urine COLORLESS (A) YELLOW   APPearance CLEAR CLEAR   Specific Gravity, Urine 1.000 (L) 1.005 - 1.030   pH 6.0 5.0 - 8.0   Glucose, UA NEGATIVE NEGATIVE mg/dL   Hgb urine dipstick NEGATIVE NEGATIVE   Bilirubin Urine NEGATIVE NEGATIVE   Ketones, ur NEGATIVE NEGATIVE mg/dL   Protein, ur NEGATIVE NEGATIVE mg/dL   Nitrite NEGATIVE NEGATIVE   Leukocytes,Ua NEGATIVE NEGATIVE  CBC     Status: Abnormal   Collection Time: 08/17/20 12:58 AM  Result Value Ref Range   WBC 8.8 4.0 - 10.5 K/uL   RBC 4.20 3.87 - 5.11 MIL/uL   Hemoglobin 12.1 12.0 - 15.0 g/dL   HCT 37.0 36.0 - 46.0 %   MCV 88.1 80.0 - 100.0 fL   MCH 28.8 26.0 - 34.0 pg   MCHC 32.7 30.0 - 36.0 g/dL   RDW 14.2 11.5 - 15.5 %   Platelets 115 (L) 150 - 400 K/uL   nRBC 0.0 0.0 - 0.2 %  Basic metabolic panel     Status: Abnormal   Collection Time: 08/17/20 12:58 AM  Result Value Ref Range   Sodium 133 (L) 135 - 145 mmol/L   Potassium 3.7 3.5 - 5.1 mmol/L   Chloride 106 98 - 111 mmol/L   CO2 19 (L) 22 - 32 mmol/L   Glucose, Bld 97 70 - 99 mg/dL   BUN <5 (L) 6 - 20 mg/dL   Creatinine, Ser 0.51 0.44 - 1.00 mg/dL   Calcium 8.9 8.9 - 10.3 mg/dL   GFR, Estimated >60 >60 mL/min   Anion gap 8 5 - 15   MAU Course  Procedures Zofran  MDM Labs ordered and reviewed. No signs of acute process or PTL, likely gastroenteritis. Discussed treatment and expectation of course. Tolerating po, no further emesis. Stable for discharge home.  Assessment and Plan   1. [redacted] weeks gestation of pregnancy   2.  Gastroenteritis     Discharge home [facility] as scheduled Return precautions Rx Zofran Imodium prn OTC Maintain hydration  Allergies as of 08/17/2020   No Known Allergies     Medication List    TAKE these medications   acetaminophen 325 MG tablet Commonly known as: TYLENOL Take 2 tablets (650 mg total) by mouth every 6 (six) hours as needed for mild pain or headache (do not take over 3000 mg in a day of tylenol. please note there is tylenol in the fiorecet).   acetaZOLAMIDE 250 MG tablet Commonly known as: DIAMOX Take by mouth.   Blood Pressure Kit Devi 1 kit by Does not apply route as needed.   Comfort Fit Maternity Supp Lg Misc 1 Units by Does not apply route daily.   cyclobenzaprine 5 MG tablet Commonly known as: FLEXERIL Take 1-2 tablets (5-10 mg total) by mouth at bedtime as needed for muscle spasms.   enoxaparin 80 MG/0.8ML injection Commonly known as: LOVENOX Inject 0.8 mLs (80 mg total) into the skin every 12 (twelve) hours.   ondansetron 4 MG disintegrating tablet Commonly known as: Zofran ODT Take 1 tablet (4 mg total) by mouth every 8 (eight) hours as needed for nausea or vomiting.   Prenatal Vitamin 27-0.8 MG Tabs Take 1 tablet by mouth daily.      Julianne Handler, CNM 08/17/2020, 1:59 AM

## 2020-08-17 LAB — CBC
HCT: 37 % (ref 36.0–46.0)
Hemoglobin: 12.1 g/dL (ref 12.0–15.0)
MCH: 28.8 pg (ref 26.0–34.0)
MCHC: 32.7 g/dL (ref 30.0–36.0)
MCV: 88.1 fL (ref 80.0–100.0)
Platelets: 115 10*3/uL — ABNORMAL LOW (ref 150–400)
RBC: 4.2 MIL/uL (ref 3.87–5.11)
RDW: 14.2 % (ref 11.5–15.5)
WBC: 8.8 10*3/uL (ref 4.0–10.5)
nRBC: 0 % (ref 0.0–0.2)

## 2020-08-17 LAB — BASIC METABOLIC PANEL
Anion gap: 8 (ref 5–15)
BUN: <5 mg/dL — ABNORMAL LOW (ref 6–20)
CO2: 19 mmol/L — ABNORMAL LOW (ref 22–32)
Calcium: 8.9 mg/dL (ref 8.9–10.3)
Chloride: 106 mmol/L (ref 98–111)
Creatinine, Ser: 0.51 mg/dL (ref 0.44–1.00)
GFR, Estimated: >60 mL/min (ref >60)
Glucose, Bld: 97 mg/dL (ref 70–99)
Potassium: 3.7 mmol/L (ref 3.5–5.1)
Sodium: 133 mmol/L — ABNORMAL LOW (ref 135–145)

## 2020-08-17 MED ORDER — ONDANSETRON 4 MG PO TBDP: 4.0000 mg | ORAL_TABLET | Freq: Three times a day (TID) | ORAL | 0 refills | Status: DC | PRN

## 2020-08-17 NOTE — MAU Note (Signed)
Reassessed pt. N/V after giving zofran.  Gave patient ginger ale to see how she feels after.

## 2020-08-17 NOTE — MAU Note (Signed)
Patient reports no new emesis occurrences since being in MAU

## 2020-08-17 NOTE — Discharge Instructions (Signed)

## 2020-09-02 ENCOUNTER — Encounter: Payer: Self-pay | Admitting: Obstetrics and Gynecology

## 2020-09-02 ENCOUNTER — Ambulatory Visit (INDEPENDENT_AMBULATORY_CARE_PROVIDER_SITE_OTHER): Payer: 59 | Admitting: Obstetrics and Gynecology

## 2020-09-02 ENCOUNTER — Other Ambulatory Visit: Payer: Self-pay

## 2020-09-02 VITALS — BP 118/78 | HR 108 | Wt 164.0 lb

## 2020-09-02 DIAGNOSIS — O0990 Supervision of high risk pregnancy, unspecified, unspecified trimester: Secondary | ICD-10-CM

## 2020-09-02 DIAGNOSIS — G932 Benign intracranial hypertension: Secondary | ICD-10-CM

## 2020-09-02 DIAGNOSIS — O99119 Other diseases of the blood and blood-forming organs and certain disorders involving the immune mechanism complicating pregnancy, unspecified trimester: Secondary | ICD-10-CM

## 2020-09-02 DIAGNOSIS — D696 Thrombocytopenia, unspecified: Secondary | ICD-10-CM

## 2020-09-02 DIAGNOSIS — Z3A28 28 weeks gestation of pregnancy: Secondary | ICD-10-CM

## 2020-09-02 DIAGNOSIS — G08 Intracranial and intraspinal phlebitis and thrombophlebitis: Secondary | ICD-10-CM

## 2020-09-02 NOTE — Progress Notes (Signed)
ROB, c/o being unhappy, stressed, sad.  PHQ-9=4

## 2020-09-02 NOTE — Progress Notes (Signed)
   PRENATAL VISIT NOTE  Subjective:  Andrea Hayes is a 30 y.o. G1P0 at [redacted]w[redacted]d being seen today for ongoing prenatal care.  She is currently monitored for the following issues for this high-risk pregnancy and has Cerebral venous thrombosis; Acute right arterial ischemic stroke, middle cerebral artery (MCA) (HCC); Dural venous sinus thrombosis; Intracranial hypertension; Supervision of high risk pregnancy, antepartum; Headache in pregnancy, antepartum, first trimester; and Thrombocytopenia affecting pregnancy (HCC) on their problem list.  Patient reports a lot of stress at work because they give her a hard time about breaks and using the restroom..  Contractions: Not present. Vag. Bleeding: None.  Movement: Present. Denies leaking of fluid.   The following portions of the patient's history were reviewed and updated as appropriate: allergies, current medications, past family history, past medical history, past social history, past surgical history and problem list.   Objective:   Vitals:   09/02/20 0858  BP: 118/78  Pulse: (!) 108  Weight: 164 lb (74.4 kg)    Fetal Status: Fetal Heart Rate (bpm): 151   Movement: Present     General:  Alert, oriented and cooperative. Patient is in no acute distress.  Skin: Skin is warm and dry. No rash noted.   Cardiovascular: Normal heart rate noted  Respiratory: Normal respiratory effort, no problems with respiration noted  Abdomen: Soft, gravid, appropriate for gestational age.  Pain/Pressure: Absent     Pelvic: Cervical exam deferred        Extremities: Normal range of motion.  Edema: None  Mental Status: Normal mood and affect. Normal behavior. Normal judgment and thought content.   Assessment and Plan:  Pregnancy: G1P0 at [redacted]w[redacted]d 1. Thrombocytopenia affecting pregnancy (HCC) Recheck platelets 36 weeks  2. Cerebral venous thrombosis Cont lovenox 80 mg BID  3. Intracranial hypertension Has follow up with neurology on 09/14/20  4. Supervision of  high risk pregnancy, antepartum Declines contraception Reports JJ vaccine prior to pregnancy Work restrictions letter given  5. [redacted] weeks gestation of pregnancy   Preterm labor symptoms and general obstetric precautions including but not limited to vaginal bleeding, contractions, leaking of fluid and fetal movement were reviewed in detail with the patient. Please refer to After Visit Summary for other counseling recommendations.   Return in about 2 weeks (around 09/16/2020) for high OB, in person.  Future Appointments  Date Time Provider Department Center  09/14/2020 11:10 AM Drema Dallas, DO [facility] None  09/28/2020  7:15 AM [facility] NURSE [facility]  09/28/2020  7:30 AM [facility] US3 [facility]    Conan Bowens, MD

## 2020-09-12 NOTE — Progress Notes (Signed)
NEUROLOGY FOLLOW UP OFFICE NOTE  Andrea Hayes [id number]  Assessment/Plan:   1.  Idiopathic intracranial hypertension 2.  History of cerebral dural sinus thrombosis 3.  Pregnant  Currently doing well.  She is no longer having headaches, pulsatile tinnitus or other symptoms suggestive of active elevated intracranial pressure.  She is now off of acetazolamide.  1.  She may remain off of acetazolamide as she has been doing well.   2.  She will continue Lovenox as per OBGYN 3.  I still recommended to follow up with ophthalmology to assess for papilledema in order to determine if acetazolamide is still warranted. 4.  Otherwise, follow up 6 months.  Subjective:  Andrea Hayes is a 30 year old woman who follow up for idiopathic intracranial hypertension with history of dural venous sinus thrombosis with right MCA stroke.  UPDATE: MRV of head without contrast on 05/24/2020 showed worsened segmental superior sagittal sinus thrombosis (compared to CTV 08/05/18) and flow in both transverse and sigmoid sinuses but with chronic scarring of right transverse sinus region, likely sequelae of previous thrombosis/stroke but cannot rule out active/recent thrombosis.  Given that she had prematurely stopped warfarin, we took precaution and had her on Lovenox during her pregnancy.  She was also restarted on acetazolamide 538m twice daily.  She was hospitalized for headache on 08/12/2020.  Repeat MRI/MRV of head without contrast at that time were stable.  Headaches are improved.  Denies visual obscurations, blurred vision or pulsatile tinnitus.  She stopped taking the acetazolamide regularly.  She has not followed up with ophthalmology.  She is expecting to give birth in May  HISTORY: She began experiencing blurred vision and intermittent headacheson top of head. In October.She also reports visual obscurations as well. 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 The next day she went to the ED for further evaluation and treatment. MRI of brain with and without contrast was personally reviewed and demonstrated tortuous optic nerves with prominent optic nerve sheath fluid which may be seen in setting of intracranial hypertension. Neurology was consulted and LP was recommended. However, she declined the procedure and left against medical advice.  She followed up with me in January 2020 andunderwent workup for intracranial hypertension. Lumbar puncture from 07/09/18 demonstrated an opening pressure of 47 cm water with closing pressure of 16 cm water. CSF cell count, glucose, protein and culture were normal. She was started on Diamox 5026mtwice daily. MRV of head from 07/12/18 was personally reviewed and was normal with no evidence of thrombosis or stenosis. [facility] severe headache with nausea, vomiting and photophobia. CT of head was personally reviewed and showed dural venous sinus thrombosis involving the superior sagittal sinus, straight sinus, right transverse sinus and sigmoid sinus and smaller branching veins. Follow up CTV of head confirmed acute venous sinus thrombosis from superior sagittal sinus to right sigmoid sinus and within intracerebral veins. Hypercoagulable workup was unremarkable, includinghomocystein 4.8, antithrombin III mildly elevated at 128, low protein S activity of 22, total protein S 109, protein C activity 180, total protein C 127, lupus anticoagulant 25.3, DRVVT 44.7, beta-2-glycoprotein antibodies <9, factor V leiden mutation negative, prothrombin gene mutation negative, cardiolipin antibodies negative, sickle cell screen negative and negative for pregnancy. She was started on lovenox and bridged to warfarin. She was discharged on Fioricet for her headaches. She was started on topiramate 5069mwice daily.Headache never improved and returned to the ED  on 08/04/18. INR was  therapeutic at2.43. CT personally reviewed and showed subacute dural venous sinus thrombosis and intracerebral veins with partial recanalization and no acute component. She received IV Reglan and Benadryl and headache resolved. Headaches subsequently subsided, so she self-discontinued medications - warfarin, acetazolamide and topiramate, and lost to follow up.  She became pregnant in late 2021.  Started having headaches and pulsatile tinnitus again.    PAST MEDICAL HISTORY: Past Medical History:  Diagnosis Date  . Stroke [facility])     MEDICATIONS: Current Outpatient Medications on File Prior to Visit  Medication Sig Dispense Refill  . acetaminophen (TYLENOL) 325 MG tablet Take 2 tablets (650 mg total) by mouth every 6 (six) hours as needed for mild pain or headache (do not take over 3000 mg in a day of tylenol. please note there is tylenol in the fiorecet).    Marland Kitchen acetaZOLAMIDE (DIAMOX) 250 MG tablet Take by mouth.    . Blood Pressure Monitoring (BLOOD PRESSURE KIT) DEVI 1 kit by Does not apply route as needed. (Patient not taking: Reported on 07/15/2020) 1 each 0  . cyclobenzaprine (FLEXERIL) 5 MG tablet Take 1-2 tablets (5-10 mg total) by mouth at bedtime as needed for muscle spasms. 30 tablet 0  . Elastic Bandages & Supports (COMFORT FIT MATERNITY SUPP LG) MISC 1 Units by Does not apply route daily. (Patient not taking: Reported on 07/15/2020) 1 each 0  . enoxaparin (LOVENOX) 80 MG/0.8ML injection Inject 0.8 mLs (80 mg total) into the skin every 12 (twelve) hours. 144 mL 3  . ondansetron (ZOFRAN ODT) 4 MG disintegrating tablet Take 1 tablet (4 mg total) by mouth every 8 (eight) hours as needed for nausea or vomiting. 20 tablet 0  . Prenatal Vit-Fe Fumarate-FA (PRENATAL VITAMIN) 27-0.8 MG TABS Take 1 tablet by mouth daily. 30 tablet 12   No current facility-administered medications on file prior to visit.    ALLERGIES: No Known Allergies  FAMILY HISTORY: Family History  Problem Relation Age  of Onset  . Obesity Neg Hx   . Hypertension Neg Hx   . Diabetes Neg Hx   . Cancer Neg Hx       Objective:  Blood pressure 112/64, pulse (!) 139, height _0  (1.651 m), weight 164 lb 3.2 oz (74.5 kg), last menstrual period 02/14/2020, SpO2 95 %. General: No acute distress.  Patient appears well-groomed.   Head:  Normocephalic/atraumatic Eyes:  Fundi examined but not visualized Neck: supple, no paraspinal tenderness, full range of motion Heart:  Regular rate and rhythm Lungs:  Clear to auscultation bilaterally Back: No paraspinal tenderness Neurological Exam: alert and oriented to person, place, and time. Attention span and concentration intact, recent and remote memory intact, fund of knowledge intact.  Speech fluent and not dysarthric, language intact.  CN II-XII intact. Bulk and tone normal, muscle strength 5/5 throughout.  Sensation to light touch intact.  Deep tendon reflexes 2+ throughout, toes downgoing.  Finger to nose and heel to shin testing intact.  Gait normal, Romberg negative.     Metta Clines, DO  CC:  [name], MD

## 2020-09-14 ENCOUNTER — Encounter: Payer: Self-pay | Admitting: Neurology

## 2020-09-14 ENCOUNTER — Other Ambulatory Visit: Payer: Self-pay

## 2020-09-14 ENCOUNTER — Ambulatory Visit: Payer: 59 | Admitting: Neurology

## 2020-09-14 VITALS — BP 112/64 | HR 139 | Ht 65.0 in | Wt 164.2 lb

## 2020-09-14 DIAGNOSIS — G932 Benign intracranial hypertension: Secondary | ICD-10-CM

## 2020-09-14 DIAGNOSIS — G08 Intracranial and intraspinal phlebitis and thrombophlebitis: Secondary | ICD-10-CM

## 2020-09-14 NOTE — Patient Instructions (Signed)
1.  Please follow up with Dr. Wynell Balloon at [facility] for repeat eye exam 2.  Follow up in 6 months.

## 2020-09-16 ENCOUNTER — Ambulatory Visit (INDEPENDENT_AMBULATORY_CARE_PROVIDER_SITE_OTHER): Payer: 59 | Admitting: Obstetrics & Gynecology

## 2020-09-16 ENCOUNTER — Other Ambulatory Visit: Payer: Self-pay

## 2020-09-16 VITALS — BP 111/77 | HR 114 | Wt 165.0 lb

## 2020-09-16 DIAGNOSIS — O0990 Supervision of high risk pregnancy, unspecified, unspecified trimester: Secondary | ICD-10-CM

## 2020-09-16 DIAGNOSIS — G08 Intracranial and intraspinal phlebitis and thrombophlebitis: Secondary | ICD-10-CM

## 2020-09-16 NOTE — Patient Instructions (Signed)
Third Trimester of Pregnancy  The third trimester of pregnancy is from week 28 through week 40. This is also called months 7 through 9. This trimester is when your unborn baby (fetus) is growing very fast. At the end of the ninth month, the unborn baby is about 20 inches long. It weighs about 6-10 pounds. Body changes during your third trimester Your body continues to go through many changes during this time. The changes vary and generally return to normal after the baby is born. Physical changes  Your weight will continue to increase. You may gain 25-35 pounds (11-16 kg) by the end of the pregnancy. If you are underweight, you may gain 28-40 lb (about 13-18 kg). If you are overweight, you may gain 15-25 lb (about 7-11 kg).  You may start to get stretch marks on your hips, belly (abdomen), and breasts.  Your breasts will continue to grow and may hurt. A yellow fluid (colostrum) may leak from your breasts. This is the first milk you are making for your baby.  You may have changes in your hair.  Your belly button may stick out.  You may have more swelling in your hands, face, or ankles. Health changes  You may have heartburn.  You may have trouble pooping (constipation).  You may get hemorrhoids. These are swollen veins in the butt that can itch or get painful.  You may have swollen veins (varicose veins) in your legs.  You may have more body aches in the pelvis, back, or thighs.  You may have more tingling or numbness in your hands, arms, and legs. The skin on your belly may also feel numb.  You may feel short of breath as your womb (uterus) gets bigger. Other changes  You may pee (urinate) more often.  You may have more problems sleeping.  You may notice the unborn baby "dropping," or moving lower in your belly.  You may have more discharge coming from your vagina.  Your joints may feel loose, and you may have pain around your pelvic bone. Follow these instructions at  home: Medicines  Take over-the-counter and prescription medicines only as told by your doctor. Some medicines are not safe during pregnancy.  Take a prenatal vitamin that contains at least 600 micrograms (mcg) of folic acid. Eating and drinking  Eat healthy meals that include: ? Fresh fruits and vegetables. ? Whole grains. ? Good sources of protein, such as meat, eggs, or tofu. ? Low-fat dairy products.  Avoid raw meat and unpasteurized juice, milk, and cheese. These carry germs that can harm you and your baby.  Eat 4 or 5 small meals rather than 3 large meals a day.  You may need to take these actions to prevent or treat trouble pooping: ? Drink enough fluids to keep your pee (urine) pale yellow. ? Eat foods that are high in fiber. These include beans, whole grains, and fresh fruits and vegetables. ? Limit foods that are high in fat and sugar. These include fried or sweet foods. Activity  Exercise only as told by your doctor. Stop exercising if you start to have cramps in your womb.  Avoid heavy lifting.  Do not exercise if it is too hot or too humid, or if you are in a place of great height (high altitude).  If you choose to, you may have sex unless your doctor tells you not to. Relieving pain and discomfort  Take breaks often, and rest with your legs raised (elevated) if you have   leg cramps or low back pain.  Take warm water baths (sitz baths) to soothe pain or discomfort caused by hemorrhoids. Use hemorrhoid cream if your doctor approves.  Wear a good support bra if your breasts are tender.  If you develop bulging, swollen veins in your legs: ? Wear support hose as told by your doctor. ? Raise your feet for 15 minutes, 3-4 times a day. ? Limit salt in your food. Safety  Talk to your doctor before traveling far distances.  Do not use hot tubs, steam rooms, or saunas.  Wear your seat belt at all times when you are in a car.  Talk with your doctor if someone is  hurting you or yelling at you a lot. Preparing for your baby's arrival To prepare for the arrival of your baby:  Take prenatal classes.  Visit the hospital and tour the maternity area.  Buy a rear-facing car seat. Learn how to install it in your car.  Prepare the baby's room. Take out all pillows and stuffed animals from the baby's crib. General instructions  Avoid cat litter boxes and soil used by cats. These carry germs that can cause harm to the baby and can cause a loss of your baby by miscarriage or stillbirth.  Do not douche or use tampons. Do not use scented sanitary pads.  Do not smoke or use any products that contain nicotine or tobacco. If you need help quitting, ask your doctor.  Do not drink alcohol.  Do not use herbal medicines, illegal drugs, or medicines that were not approved by your doctor. Chemicals in these products can affect your baby.  Keep all follow-up visits. This is important. Where to find more information  American Pregnancy Association: americanpregnancy.org  American College of Obstetricians and Gynecologists: www.acog.org  Office on Women's Health: womenshealth.gov/pregnancy Contact a doctor if:  You have a fever.  You have mild cramps or pressure in your lower belly.  You have a nagging pain in your belly area.  You vomit, or you have watery poop (diarrhea).  You have bad-smelling fluid coming from your vagina.  You have pain when you pee, or your pee smells bad.  You have a headache that does not go away when you take medicine.  You have changes in how you see, or you see spots in front of your eyes. Get help right away if:  Your water breaks.  You have regular contractions that are less than 5 minutes apart.  You are spotting or bleeding from your vagina.  You have very bad belly cramps or pain.  You have trouble breathing.  You have chest pain.  You faint.  You have not felt the baby move for the amount of time told by  your doctor.  You have new or increased pain, swelling, or redness in an arm or leg. Summary  The third trimester is from week 28 through week 40 (months 7 through 9). This is the time when your unborn baby is growing very fast.  During this time, your discomfort may increase as you gain weight and as your baby grows.  Get ready for your baby to arrive by taking prenatal classes, buying a rear-facing car seat, and preparing the baby's room.  Get help right away if you are bleeding from your vagina, you have chest pain and trouble breathing, or you have not felt the baby move for the amount of time told by your doctor. This information is not intended to replace advice given   to you by your health care provider. Make sure you discuss any questions you have with your health care provider. Document Revised: 11/25/2019 Document Reviewed: 10/01/2019 Elsevier Patient Education  2021 Elsevier Inc.  

## 2020-09-16 NOTE — Progress Notes (Addendum)
ROB c/o pressure. 

## 2020-09-16 NOTE — Progress Notes (Signed)
   PRENATAL VISIT NOTE  Subjective:  Andrea Hayes is a 30 y.o. G1P0 at [redacted]w[redacted]d being seen today for ongoing prenatal care.  She is currently monitored for the following issues for this high-risk pregnancy and has Cerebral venous thrombosis; Acute right arterial ischemic stroke, middle cerebral artery (MCA) (HCC); Dural venous sinus thrombosis; Intracranial hypertension; Supervision of high risk pregnancy, antepartum; Headache in pregnancy, antepartum, first trimester; and Thrombocytopenia affecting pregnancy (HCC) on their problem list.  Patient reports no complaints.  Contractions: Not present. Vag. Bleeding: None.  Movement: Present. Denies leaking of fluid.   The following portions of the patient's history were reviewed and updated as appropriate: allergies, current medications, past family history, past medical history, past social history, past surgical history and problem list.   Objective:   Vitals:   09/16/20 0845  BP: 111/77  Pulse: (!) 114  Weight: 165 lb (74.8 kg)    Fetal Status: Fetal Heart Rate (bpm): 143   Movement: Present     General:  Alert, oriented and cooperative. Patient is in no acute distress.  Skin: Skin is warm and dry. No rash noted.   Cardiovascular: Normal heart rate noted  Respiratory: Normal respiratory effort, no problems with respiration noted  Abdomen: Soft, gravid, appropriate for gestational age.  Pain/Pressure: Present     Pelvic: Cervical exam deferred        Extremities: Normal range of motion.  Edema: None  Mental Status: Normal mood and affect. Normal behavior. Normal judgment and thought content.   Assessment and Plan:  Pregnancy: G1P0 at [redacted]w[redacted]d 1. Supervision of high risk pregnancy, antepartum Has f/u US 1 week  2. Cerebral venous thrombosis On Lovenox  Preterm labor symptoms and general obstetric precautions including but not limited to vaginal bleeding, contractions, leaking of fluid and fetal movement were reviewed in detail with the  patient. Please refer to After Visit Summary for other counseling recommendations.   Return in about 2 weeks (around 09/30/2020).  Future Appointments  Date Time Provider Department Center  09/28/2020  7:15 AM [facility] NURSE [facility]  09/28/2020  7:30 AM [facility] US3 [facility]  03/21/2021 10:30 AM Drema Dallas, DO [facility] None    Scheryl Darter, MD

## 2020-09-20 ENCOUNTER — Inpatient Hospital Stay (HOSPITAL_COMMUNITY)
Admission: AD | Admit: 2020-09-20 | Discharge: 2020-09-20 | Disposition: A | Discharge status: Home / Self Care | Payer: 59 | Attending: Obstetrics and Gynecology | Admitting: Obstetrics and Gynecology

## 2020-09-20 ENCOUNTER — Encounter (HOSPITAL_COMMUNITY): Payer: Self-pay | Admitting: Obstetrics and Gynecology

## 2020-09-20 DIAGNOSIS — Z8673 Personal history of transient ischemic attack (TIA), and cerebral infarction without residual deficits: Secondary | ICD-10-CM | POA: Insufficient documentation

## 2020-09-20 DIAGNOSIS — R109 Unspecified abdominal pain: Secondary | ICD-10-CM | POA: Insufficient documentation

## 2020-09-20 DIAGNOSIS — Z79899 Other long term (current) drug therapy: Secondary | ICD-10-CM | POA: Insufficient documentation

## 2020-09-20 DIAGNOSIS — Z3A31 31 weeks gestation of pregnancy: Secondary | ICD-10-CM | POA: Diagnosis not present

## 2020-09-20 DIAGNOSIS — O26893 Other specified pregnancy related conditions, third trimester: Secondary | ICD-10-CM | POA: Diagnosis not present

## 2020-09-20 DIAGNOSIS — O99891 Other specified diseases and conditions complicating pregnancy: Secondary | ICD-10-CM | POA: Diagnosis not present

## 2020-09-20 DIAGNOSIS — M549 Dorsalgia, unspecified: Secondary | ICD-10-CM | POA: Diagnosis not present

## 2020-09-20 LAB — URINALYSIS, ROUTINE W REFLEX MICROSCOPIC
Bilirubin Urine: NEGATIVE
Glucose, UA: NEGATIVE mg/dL
Hgb urine dipstick: NEGATIVE
Ketones, ur: NEGATIVE mg/dL
Nitrite: NEGATIVE
Protein, ur: NEGATIVE mg/dL
Specific Gravity, Urine: 1.01 (ref 1.005–1.030)
pH: 6 (ref 5.0–8.0)

## 2020-09-20 MED ORDER — CYCLOBENZAPRINE HCL 5 MG PO TABS: 5.0000 mg | ORAL_TABLET | Freq: Three times a day (TID) | ORAL | 0 refills | Status: DC | PRN

## 2020-09-20 MED ORDER — ACETAMINOPHEN 500 MG PO TABS
1000.0000 mg | ORAL_TABLET | Freq: Once | ORAL | Status: AC
  Administered 2020-09-20: 1000 mg via ORAL
  Filled 2020-09-20: qty 2

## 2020-09-20 MED ORDER — CYCLOBENZAPRINE HCL 5 MG PO TABS
10.0000 mg | ORAL_TABLET | Freq: Once | ORAL | Status: AC
  Administered 2020-09-20: 10 mg via ORAL
  Filled 2020-09-20: qty 2

## 2020-09-20 NOTE — MAU Note (Signed)
Having back pain and stomach pain, started in the night, is very bad. Pain is constant. Denies bleeding or leaking. Baby is moving. Denies GI or GU complaints.

## 2020-09-20 NOTE — Discharge Instructions (Signed)
Back Pain in Pregnancy Back pain during pregnancy is common. Back pain may be caused by several factors that are related to changes during your pregnancy. Follow these instructions at home: Managing pain, stiffness, and swelling  If directed, for sudden (acute) back pain, put ice on the painful area. ? Put ice in a plastic bag. ? Place a towel between your skin and the bag. ? Leave the ice on for 20 minutes, 2-3 times per day.  If directed, apply heat to the affected area before you exercise. Use the heat source that your health care provider recommends, such as a moist heat pack or a heating pad. ? Place a towel between your skin and the heat source. ? Leave the heat on for 20-30 minutes. ? Remove the heat if your skin turns bright red. This is especially important if you are unable to feel pain, heat, or cold. You may have a greater risk of getting burned.  If directed, massage the affected area.      Activity  Exercise as told by your health care provider. Gentle exercise is the best way to prevent or manage back pain.  Listen to your body when lifting. If lifting hurts, ask for help or bend your knees. This uses your leg muscles instead of your back muscles.  Squat down when picking up something from the floor. Do not bend over.  Only use bed rest for short periods as told by your health care provider. Bed rest should only be used for the most severe episodes of back pain. Standing, sitting, and lying down  Do not stand in one place for long periods of time.  Use good posture when sitting. Make sure your head rests over your shoulders and is not hanging forward. Use a pillow on your lower back if necessary.  Try sleeping on your side, preferably the left side, with a pregnancy support pillow or 1-2 regular pillows between your legs. ? If you have back pain after a night's rest, your bed may be too soft. ? A firm mattress may provide more support for your back during  pregnancy. General instructions  Do not wear high heels.  Eat a healthy diet. Try to gain weight within your health care provider's recommendations.  Use a maternity girdle, elastic sling, or back brace as told by your health care provider.  Take over-the-counter and prescription medicines only as told by your health care provider.  Work with a physical therapist or massage therapist to find ways to manage back pain. Acupuncture or massage therapy may be helpful.  Keep all follow-up visits as told by your health care provider. This is important. Contact a health care provider if:  Your back pain interferes with your daily activities.  You have increasing pain in other parts of your body. Get help right away if:  You develop numbness, tingling, weakness, or problems with the use of your arms or legs.  You develop severe back pain that is not controlled with medicine.  You have a change in bowel or bladder control.  You develop shortness of breath, dizziness, or you faint.  You develop nausea, vomiting, or sweating.  You have back pain that is a rhythmic, cramping pain similar to labor pains. Labor pain is usually 1-2 minutes apart, lasts for about 1 minute, and involves a bearing down feeling or pressure in your pelvis.  You have back pain and your water breaks or you have vaginal bleeding.  You have back pain or   numbness that travels down your leg.  Your back pain developed after you fell.  You develop pain on one side of your back.  You see blood in your urine.  You develop skin blisters in the area of your back pain. Summary  Back pain may be caused by several factors that are related to changes during your pregnancy.  Follow instructions as told by your health care provider for managing pain, stiffness, and swelling.  Exercise as told by your health care provider. Gentle exercise is the best way to prevent or manage back pain.  Take over-the-counter and  prescription medicines only as told by your health care provider.  Keep all follow-up visits as told by your health care provider. This is important. This information is not intended to replace advice given to you by your health care provider. Make sure you discuss any questions you have with your health care provider. Document Revised: 10/07/2018 Document Reviewed: 12/04/2017 Elsevier Patient Education  2021 Elsevier Inc.  

## 2020-09-20 NOTE — MAU Provider Note (Signed)
History     CSN: [id number]  Arrival date and time: 09/20/20 1245   Event Date/Time   First Provider Initiated Contact with Patient 09/20/20 1344      Chief Complaint  Patient presents with  . Abdominal Pain  . Back Pain   HPI Andrea Hayes is a 30 y.o. G1P0 at 67w2dwho presents with abdominal & back pain. Symptoms started last night. Reports intermittent generalized abdominal pain. Back pain is throughout whole back and is constant. Can't tell how frequent abdominal pain is. Rates pain 10/10. Hasn't treated symptoms. "Everything" makes pain worse. Nothing makes pain better. Denies fever/chills, n/v/d, dysuria, vaginal bleeding, or LOF. Reports good fetal movement.   OB History    Gravida  1   Para      Term      Preterm      AB      Living        SAB      IAB      Ectopic      Multiple      Live Births              Past Medical History:  Diagnosis Date  . Stroke ([facility]     Past Surgical History:  Procedure Laterality Date  . NO PAST SURGERIES      Family History  Problem Relation Age of Onset  . Asthma Father   . Obesity Neg Hx   . Hypertension Neg Hx   . Diabetes Neg Hx   . Cancer Neg Hx     Social History   Tobacco Use  . Smoking status: Never Smoker  . Smokeless tobacco: Never Used  Vaping Use  . Vaping Use: Never used  Substance Use Topics  . Alcohol use: No    Alcohol/week: 0.0 standard drinks  . Drug use: No    Allergies: No Known Allergies  Medications Prior to Admission  Medication Sig Dispense Refill Last Dose  . enoxaparin (LOVENOX) 80 MG/0.8ML injection Inject 0.8 mLs (80 mg total) into the skin every 12 (twelve) hours. 144 mL 3 09/19/2020 at Unknown time  . Prenatal Vit-Fe Fumarate-FA (PRENATAL VITAMIN) 27-0.8 MG TABS Take 1 tablet by mouth daily. 30 tablet 12 09/20/2020 at Unknown time  . acetaminophen (TYLENOL) 325 MG tablet Take 2 tablets (650 mg total) by mouth every 6 (six) hours as needed for mild pain or headache  (do not take over 3000 mg in a day of tylenol. please note there is tylenol in the fiorecet).     .Marland KitchenacetaZOLAMIDE (DIAMOX) 250 MG tablet Take by mouth.     . Blood Pressure Monitoring (BLOOD PRESSURE KIT) DEVI 1 kit by Does not apply route as needed. (Patient not taking: Reported on 09/14/2020) 1 each 0   . cyclobenzaprine (FLEXERIL) 5 MG tablet Take 1-2 tablets (5-10 mg total) by mouth at bedtime as needed for muscle spasms. 30 tablet 0   . Elastic Bandages & Supports (COMFORT FIT MATERNITY SUPP LG) MISC 1 Units by Does not apply route daily. (Patient not taking: Reported on 07/15/2020) 1 each 0   . ondansetron (ZOFRAN ODT) 4 MG disintegrating tablet Take 1 tablet (4 mg total) by mouth every 8 (eight) hours as needed for nausea or vomiting. 20 tablet 0     Review of Systems  Constitutional: Negative.   Gastrointestinal: Positive for abdominal pain. Negative for constipation, diarrhea, nausea and vomiting.  Genitourinary: Negative.   Musculoskeletal: Positive for back pain.  Physical Exam   Blood pressure 118/75, pulse 95, temperature 98.6 F (37 C), temperature source Oral, resp. rate 16, height _0  (1.651 m), weight 74.8 kg, last menstrual period 02/14/2020, SpO2 99 %.  Physical Exam Vitals and nursing note reviewed.  Constitutional:      General: She is not in acute distress.    Appearance: She is well-developed.  HENT:     Head: Normocephalic and atraumatic.  Pulmonary:     Effort: Pulmonary effort is normal. No respiratory distress.  Abdominal:     General: Bowel sounds are normal.     Palpations: Abdomen is soft.     Tenderness: There is no abdominal tenderness. There is no right CVA tenderness or left CVA tenderness.  Skin:    General: Skin is warm and dry.  Neurological:     Mental Status: She is alert.    NST:  Baseline: 145 bpm, Variability: Good {> 6 bpm), Accelerations: Reactive and Decelerations: Absent  MAU Course  Procedures Results for orders placed or  performed during the hospital encounter of 09/20/20 (from the past 24 hour(s))  Urinalysis, Routine w reflex microscopic     Status: Abnormal   Collection Time: 09/20/20  1:58 PM  Result Value Ref Range   Color, Urine YELLOW YELLOW   APPearance HAZY (A) CLEAR   Specific Gravity, Urine 1.010 1.005 - 1.030   pH 6.0 5.0 - 8.0   Glucose, UA NEGATIVE NEGATIVE mg/dL   Hgb urine dipstick NEGATIVE NEGATIVE   Bilirubin Urine NEGATIVE NEGATIVE   Ketones, ur NEGATIVE NEGATIVE mg/dL   Protein, ur NEGATIVE NEGATIVE mg/dL   Nitrite NEGATIVE NEGATIVE   Leukocytes,Ua MODERATE (A) NEGATIVE   RBC / HPF 0-5 0 - 5 RBC/hpf   WBC, UA 11-20 0 - 5 WBC/hpf   Bacteria, UA FEW (A) NONE SEEN   Squamous Epithelial / LPF 11-20 0 - 5   Mucus PRESENT     MDM Reactive tracing. No contractions on monitor. Abdomen soft. Cervix closed/thick  Pt primarily complaining of constant back pain. Not one sided. No flank pain or CVA tenderness. Treated with tylenol & flexeril in MAU with complete resolution of pain.   U/a sent for culture.   Assessment and Plan   1. Back pain affecting pregnancy in third trimester   2. [redacted] weeks gestation of pregnancy    -pt to purchase maternity support belt -rx flexeril prn -reviewed reasons to return to MAU -urine culture pending  Jorje Guild 09/20/2020, 3:23 PM

## 2020-09-22 LAB — CULTURE, OB URINE

## 2020-09-28 ENCOUNTER — Other Ambulatory Visit: Payer: Self-pay | Admitting: *Deleted

## 2020-09-28 ENCOUNTER — Ambulatory Visit: Payer: 59 | Admitting: *Deleted

## 2020-09-28 ENCOUNTER — Ambulatory Visit: Payer: 59 | Attending: Obstetrics and Gynecology

## 2020-09-28 ENCOUNTER — Encounter: Payer: Self-pay | Admitting: *Deleted

## 2020-09-28 ENCOUNTER — Other Ambulatory Visit: Payer: Self-pay

## 2020-09-28 VITALS — BP 119/77 | HR 103

## 2020-09-28 DIAGNOSIS — Z3A31 31 weeks gestation of pregnancy: Secondary | ICD-10-CM

## 2020-09-28 DIAGNOSIS — O359XX0 Maternal care for (suspected) fetal abnormality and damage, unspecified, not applicable or unspecified: Secondary | ICD-10-CM | POA: Diagnosis not present

## 2020-09-28 DIAGNOSIS — I639 Cerebral infarction, unspecified: Secondary | ICD-10-CM

## 2020-09-28 DIAGNOSIS — Z7901 Long term (current) use of anticoagulants: Secondary | ICD-10-CM | POA: Diagnosis not present

## 2020-09-28 DIAGNOSIS — Z8673 Personal history of transient ischemic attack (TIA), and cerebral infarction without residual deficits: Secondary | ICD-10-CM

## 2020-09-28 DIAGNOSIS — O99891 Other specified diseases and conditions complicating pregnancy: Secondary | ICD-10-CM | POA: Diagnosis not present

## 2020-09-28 DIAGNOSIS — R6252 Short stature (child): Secondary | ICD-10-CM

## 2020-09-30 ENCOUNTER — Other Ambulatory Visit: Payer: Self-pay

## 2020-09-30 ENCOUNTER — Ambulatory Visit (INDEPENDENT_AMBULATORY_CARE_PROVIDER_SITE_OTHER): Payer: 59 | Admitting: Obstetrics & Gynecology

## 2020-09-30 VITALS — BP 122/83 | HR 106 | Wt 165.0 lb

## 2020-09-30 DIAGNOSIS — Z3A32 32 weeks gestation of pregnancy: Secondary | ICD-10-CM

## 2020-09-30 DIAGNOSIS — G08 Intracranial and intraspinal phlebitis and thrombophlebitis: Secondary | ICD-10-CM

## 2020-09-30 DIAGNOSIS — O0990 Supervision of high risk pregnancy, unspecified, unspecified trimester: Secondary | ICD-10-CM

## 2020-09-30 NOTE — Progress Notes (Signed)
PRENATAL VISIT NOTE  Subjective:  Andrea Hayes is a 30 y.o. G1P0 at [redacted]w[redacted]d being seen today for ongoing prenatal care.  She is currently monitored for the following issues for this high-risk pregnancy and has Cerebral venous thrombosis; Acute right arterial ischemic stroke, middle cerebral artery (MCA) (HCC); Dural venous sinus thrombosis; Intracranial hypertension; Supervision of high risk pregnancy, antepartum; Headache in pregnancy, antepartum, first trimester; and Thrombocytopenia affecting pregnancy (HCC) on their problem list.  Patient reports no complaints.  Contractions: Not present. Vag. Bleeding: None.  Movement: Present. Denies leaking of fluid.   The following portions of the patient's history were reviewed and updated as appropriate: allergies, current medications, past family history, past medical history, past social history, past surgical history and problem list.   Objective:   Vitals:   09/30/20 1001  BP: 122/83  Pulse: (!) 106  Weight: 165 lb (74.8 kg)    Fetal Status: Fetal Heart Rate (bpm): 145   Movement: Present     General:  Alert, oriented and cooperative. Patient is in no acute distress.  Skin: Skin is warm and dry. No rash noted.   Cardiovascular: Normal heart rate noted  Respiratory: Normal respiratory effort, no problems with respiration noted  Abdomen: Soft, gravid, appropriate for gestational age.  Pain/Pressure: Absent     Pelvic: Cervical exam deferred        Extremities: Normal range of motion.     Mental Status: Normal mood and affect. Normal behavior. Normal judgment and thought content.    [place] MFM OB FOLLOW UP  Result Date: 09/28/2020 ----------------------------------------------------------------------  OBSTETRICS REPORT                       (Signed Final 09/28/2020 09:34 am) ---------------------------------------------------------------------- Patient Info  ID #:       [id number]                          D.O.B.:  13-Feb-1991 (29 yrs)  Name:        Andrea Hayes                  Visit Date: 09/28/2020 07:24 am ---------------------------------------------------------------------- Performed By  Attending:        Noralee Space MD        Ref. Address:     [address]                                                             [address]                                                             [place]  [address]  Performed By:     Reinaldo Raddle            [facility]  Referred By:      [facility] ---------------------------------------------------------------------- Orders  #  Description                           Code        Ordered By  1  [place] MFM OB FOLLOW UP                   41287.86    Noralee Space ----------------------------------------------------------------------  #  Order #                     Accession #                Episode #  1  [id number]                   [id number]                 [id number] ---------------------------------------------------------------------- Indications  [redacted] weeks gestation of pregnancy                Z3A.31  Maternal care for suspected fetal abnormality  O35.9XX0  LOW risk NIPS, Negative Horizon, Negative  AFP  Encounter for other antenatal screening        Z36.2  follow-up  Medical complication of pregnancy (Arterial    O26.90  ischemic stroke, cerebral venous  thrombosis, Lovenox) ---------------------------------------------------------------------- Fetal Evaluation  Num Of Fetuses:         1  Fetal Heart Rate(bpm):  148  Cardiac Activity:       Observed  Presentation:           Cephalic  Placenta:               Anterior  P. Cord Insertion:      Visualized,  central  Amniotic Fluid  AFI FV:      Within normal limits  AFI Sum(cm)     %Tile       Largest Pocket(cm)  12.28           33          4.35  RUQ(cm)       RLQ(cm)  LUQ(cm)        LLQ(cm)  4.35          3.32          1.31           3.3 ---------------------------------------------------------------------- Biometry  BPD:      78.5  mm     G. Age:  31w 4d         34  %    CI:        79.33   %    70 - 86                                                          FL/HC:      20.9   %    19.1 - 21.3  HC:      278.6  mm     G. Age:  30w 3d        2.2  %    HC/AC:      1.05        0.96 - 1.17  AC:      266.5  mm     G. Age:  30w 5d         21  %    FL/BPD:     74.0   %    71 - 87  FL:       58.1  mm     G. Age:  30w 3d          9  %    FL/AC:      21.8   %    20 - 24  LV:        3.4  mm  Est. FW:    1617  gm      3 lb 9 oz     12  % ---------------------------------------------------------------------- Gestational Age  LMP:           32w 3d        Date:  02/14/20                 EDD:   11/20/20  U/S Today:     30w 6d                                        EDD:   12/01/20  Best:          31w 5d     Det. ByMarcella Dubs         EDD:   11/25/20                                      (04/19/20) ---------------------------------------------------------------------- Anatomy  Cranium:               Previously seen        LVOT:                   Previously seen  Cavum:                 Previously seen        Aortic  Arch:            Previously seen  Ventricles:            Appears normal         Ductal Arch:            Previously seen  Choroid Plexus:        Previously seen        Diaphragm:              Appears normal  Cerebellum:            Previously seen        Stomach:                Appears normal, left                                                                        sided  Posterior Fossa:       Previously seen        Abdomen:                Previously seen  Nuchal Fold:           Not applicable (>20    Abdominal  Wall:         Previously seen                         wks GA)  Face:                  Orbits and profile     Cord Vessels:           Previously seen                         previously seen  Lips:                  Previously seen        Kidneys:                Appear normal  Palate:                Previously seen        Bladder:                Appears normal  Thoracic:              Previously seen        Spine:                  Previously seen  Heart:                 Previously seen        Upper Extremities:      Previously seen  RVOT:                  Previously seen        Lower Extremities:      Previously seen  Other:  Fetus appears to be female. Heels prev visualized. Open hands prev          visualized. Nasal bone prev visualized. Technically difficult due to  fetal position. ---------------------------------------------------------------------- Cervix Uterus Adnexa  Cervix  Not visualized (advanced GA >24wks)  Uterus  No abnormality visualized.  Right Ovary  Not visualized.  Left Ovary  Not visualized.  Cul De Sac  No free fluid seen.  Adnexa  No adnexal mass visualized. ---------------------------------------------------------------------- Impression  Patient returned for fetal growth assessment. She has not  undergone screening for gestational diabetes. She takes  lovenox 80 mg daily because of her history of stroke.  Fetal growth is appropriate for gestational age. The estimated  fetal weight is at the 12th percentile. Amniotic fluid is normal.  I reassured the patient of the findings. I encouraged her to  screen for GDM.  Because of the possibility of potential fetal growth restriction  (EFW at the 12th percentile), I recommend fetal growth  assessment in 4 weeks. ---------------------------------------------------------------------- Recommendations  -An appointment was made for her to return in 4 weeks for  fetal growth assessment.  ----------------------------------------------------------------------                  Noralee Space, MD Electronically Signed Final Report   09/28/2020 09:34 am ----------------------------------------------------------------------   Assessment and Plan:  Pregnancy: G1P0 at [redacted]w[redacted]d 1. Cerebral venous thrombosis 2. [redacted] weeks gestation of pregnancy 3. Supervision of high risk pregnancy, antepartum MFM scan recommendations reviewed, patient already had normal 2 hr GTT at 24 weeks. Continue scans as per MFM. Continue Lovenox as prescribed. Preterm labor symptoms and general obstetric precautions including but not limited to vaginal bleeding, contractions, leaking of fluid and fetal movement were reviewed in detail with the patient. Please refer to After Visit Summary for other counseling recommendations.   Return in about 2 weeks (around 10/14/2020) for OFFICE OB VISIT (MD only).  Future Appointments  Date Time Provider Department Center  10/17/2020 10:15 AM Alric Seton, MD [facility] None  10/26/2020 10:30 AM [facility] NURSE [facility]  10/26/2020 10:45 AM [facility] US4 [facility]  03/21/2021 10:30 AM Drema Dallas, DO [facility] None    Jaynie Collins, MD

## 2020-09-30 NOTE — Patient Instructions (Signed)
Return to office for any scheduled appointments. Call the office or go to the MAU at [facility] if:  You begin to have strong, frequent contractions  Your water breaks.  Sometimes it is a big gush of fluid, sometimes it is just a trickle that keeps getting your panties wet or running down your legs  You have vaginal bleeding.  It is normal to have a small amount of spotting if your cervix was checked.   You do not feel your baby moving like normal.  If you do not, get something to eat and drink and lay down and focus on feeling your baby move.   If your baby is still not moving like normal, you should call the office or go to MAU.  Any other obstetric concerns.   

## 2020-10-17 ENCOUNTER — Ambulatory Visit (INDEPENDENT_AMBULATORY_CARE_PROVIDER_SITE_OTHER): Payer: 59 | Admitting: Family Medicine

## 2020-10-17 ENCOUNTER — Other Ambulatory Visit: Payer: Self-pay

## 2020-10-17 VITALS — BP 122/81 | HR 120 | Wt 168.0 lb

## 2020-10-17 DIAGNOSIS — G08 Intracranial and intraspinal phlebitis and thrombophlebitis: Secondary | ICD-10-CM

## 2020-10-17 DIAGNOSIS — G932 Benign intracranial hypertension: Secondary | ICD-10-CM

## 2020-10-17 DIAGNOSIS — Z23 Encounter for immunization: Secondary | ICD-10-CM | POA: Diagnosis not present

## 2020-10-17 DIAGNOSIS — R350 Frequency of micturition: Secondary | ICD-10-CM

## 2020-10-17 DIAGNOSIS — O0990 Supervision of high risk pregnancy, unspecified, unspecified trimester: Secondary | ICD-10-CM

## 2020-10-17 DIAGNOSIS — D696 Thrombocytopenia, unspecified: Secondary | ICD-10-CM

## 2020-10-17 DIAGNOSIS — O99119 Other diseases of the blood and blood-forming organs and certain disorders involving the immune mechanism complicating pregnancy, unspecified trimester: Secondary | ICD-10-CM

## 2020-10-17 DIAGNOSIS — I63511 Cerebral infarction due to unspecified occlusion or stenosis of right middle cerebral artery: Secondary | ICD-10-CM

## 2020-10-17 LAB — POCT URINALYSIS DIPSTICK
Bilirubin, UA: NEGATIVE
Glucose, UA: NEGATIVE
Ketones, UA: NEGATIVE
Leukocytes, UA: NEGATIVE
Nitrite, UA: NEGATIVE
Protein, UA: POSITIVE — AB
Spec Grav, UA: 1.02 (ref 1.010–1.025)
Urobilinogen, UA: 0.2 E.U./dL
pH, UA: 6 (ref 5.0–8.0)

## 2020-10-17 NOTE — Progress Notes (Signed)
Pt complains of urinary frequency with occasional pain, will dip urine today.

## 2020-10-17 NOTE — Progress Notes (Signed)
   PRENATAL VISIT NOTE  Subjective:  Andrea Hayes is a 30 y.o. G1P0 at [redacted]w[redacted]d being seen today for ongoing prenatal care.  She is currently monitored for the following issues for this high-risk pregnancy and has Cerebral venous thrombosis; Acute right arterial ischemic stroke, middle cerebral artery (MCA) (HCC); Dural venous sinus thrombosis; Intracranial hypertension; Supervision of high risk pregnancy, antepartum; Headache in pregnancy, antepartum, first trimester; and Thrombocytopenia affecting pregnancy (HCC) on their problem list.  Patient reports urinary frequency x1 week. She also endorses dysuria and urgency. Denies hematuria, abnormal vaginal discharge. No history of UTI this pregnancy.  Contractions: Not present. Vag. Bleeding: None.  Movement: Present. Denies leaking of fluid.   The following portions of the patient's history were reviewed and updated as appropriate: allergies, current medications, past family history, past medical history, past social history, past surgical history and problem list.   Objective:   Vitals:   10/17/20 0955  BP: 122/81  Pulse: (!) 120  Weight: 168 lb (76.2 kg)    Fetal Status:     Movement: Present     General:  Alert, oriented and cooperative. Patient is in no acute distress.  Skin: Skin is warm and dry. No rash noted.   Cardiovascular: Normal heart rate noted  Respiratory: Normal respiratory effort, no problems with respiration noted  Abdomen: Soft, gravid, appropriate for gestational age.  Pain/Pressure: Present     Pelvic: Cervical exam deferred        Extremities: Normal range of motion.     Mental Status: Normal mood and affect. Normal behavior. Normal judgment and thought content.   Assessment and Plan:  Pregnancy: G1P0 at [redacted]w[redacted]d 1. Supervision of high risk pregnancy, antepartum Doing well except for complaint of urinary frequency/dysuria as noted beow VSS Discussed contraception, patient declines Tdap today Breastfeeding GBS/gcc  at next appt  2. Thrombocytopenia affecting pregnancy (HCC) Platelets 115 on 08/17/20, repeat at 36 weeks (nex appt)  3. Acute right arterial ischemic stroke, middle cerebral artery (MCA) (HCC) 4. Dural venous sinus thrombosis 5. Intracranial hypertension 6. Cerebral venous thrombosis Currently asymptomatic. Declines headaches. No focal weakness/numbness/tingling. Taking lovenox as prescribed. Cleared for vaginal delivery.  Urinary frequency 1 week of urinary frequency, urgency, dysuria. No history of UTI this pregnancy. No signs/symptoms of preterm labor. POCT UA with leuks and blood. Will send for urine culture and treat as indicated.  Preterm labor symptoms and general obstetric precautions including but not limited to vaginal bleeding, contractions, leaking of fluid and fetal movement were reviewed in detail with the patient. Please refer to After Visit Summary for other counseling recommendations.   Return in about 2 weeks (around 10/31/2020) for HROB; in person; MD ony.  Future Appointments  Date Time Provider Department Center  10/26/2020 10:30 AM [facility] NURSE [facility]  10/26/2020 10:45 AM [facility] US4 [facility]  03/21/2021 10:30 AM Drema Dallas, DO [facility] None    Alric Seton, MD

## 2020-10-19 LAB — CULTURE, OB URINE

## 2020-10-19 LAB — URINE CULTURE, OB REFLEX

## 2020-10-24 ENCOUNTER — Encounter: Payer: 59 | Admitting: Obstetrics and Gynecology

## 2020-10-26 ENCOUNTER — Encounter: Payer: Self-pay | Admitting: *Deleted

## 2020-10-26 ENCOUNTER — Ambulatory Visit: Payer: 59 | Admitting: *Deleted

## 2020-10-26 ENCOUNTER — Other Ambulatory Visit: Payer: Self-pay

## 2020-10-26 ENCOUNTER — Other Ambulatory Visit: Payer: Self-pay | Admitting: Obstetrics and Gynecology

## 2020-10-26 ENCOUNTER — Ambulatory Visit: Payer: 59 | Attending: Obstetrics and Gynecology

## 2020-10-26 VITALS — BP 131/91 | HR 90

## 2020-10-26 DIAGNOSIS — O359XX0 Maternal care for (suspected) fetal abnormality and damage, unspecified, not applicable or unspecified: Secondary | ICD-10-CM | POA: Diagnosis not present

## 2020-10-26 DIAGNOSIS — O99413 Diseases of the circulatory system complicating pregnancy, third trimester: Secondary | ICD-10-CM | POA: Diagnosis not present

## 2020-10-26 DIAGNOSIS — Z8673 Personal history of transient ischemic attack (TIA), and cerebral infarction without residual deficits: Secondary | ICD-10-CM

## 2020-10-26 DIAGNOSIS — O283 Abnormal ultrasonic finding on antenatal screening of mother: Secondary | ICD-10-CM | POA: Diagnosis present

## 2020-10-26 DIAGNOSIS — R6252 Short stature (child): Secondary | ICD-10-CM | POA: Diagnosis present

## 2020-10-26 DIAGNOSIS — Z3A35 35 weeks gestation of pregnancy: Secondary | ICD-10-CM | POA: Diagnosis not present

## 2020-10-26 DIAGNOSIS — I639 Cerebral infarction, unspecified: Secondary | ICD-10-CM | POA: Diagnosis not present

## 2020-10-26 NOTE — Procedures (Signed)
Andrea Hayes 09-17-1990 [redacted]w[redacted]d  Fetus A Non-Stress Test Interpretation for 10/26/20  Indication: Unsatisfactory BPP  Fetal Heart Rate A Mode: External Baseline Rate (A): 135 bpm Variability: Moderate Accelerations: 15 x 15 Decelerations: None Multiple birth?: No  Uterine Activity Mode: Palpation,Toco Contraction Frequency (min): none Resting Tone Palpated: Relaxed Resting Time: Adequate  Interpretation (Fetal Testing) Nonstress Test Interpretation: Reactive Overall Impression: Reassuring for gestational age Comments: Dr. Judeth Cornfield reviewed tracing.

## 2020-10-27 ENCOUNTER — Other Ambulatory Visit: Payer: Self-pay | Admitting: *Deleted

## 2020-10-27 DIAGNOSIS — O365930 Maternal care for other known or suspected poor fetal growth, third trimester, not applicable or unspecified: Secondary | ICD-10-CM

## 2020-10-30 DIAGNOSIS — I639 Cerebral infarction, unspecified: Secondary | ICD-10-CM

## 2020-10-30 HISTORY — DX: Cerebral infarction, unspecified: I63.9

## 2020-10-31 ENCOUNTER — Other Ambulatory Visit: Payer: Self-pay

## 2020-10-31 ENCOUNTER — Encounter: Payer: Self-pay | Admitting: Obstetrics and Gynecology

## 2020-10-31 ENCOUNTER — Other Ambulatory Visit (HOSPITAL_COMMUNITY)
Admission: RE | Admit: 2020-10-31 | Discharge: 2020-10-31 | Disposition: A | Discharge status: Home / Self Care | Payer: 59 | Source: Ambulatory Visit | Attending: Obstetrics and Gynecology | Admitting: Obstetrics and Gynecology

## 2020-10-31 ENCOUNTER — Other Ambulatory Visit: Payer: Self-pay | Admitting: Advanced Practice Midwife

## 2020-10-31 ENCOUNTER — Ambulatory Visit (INDEPENDENT_AMBULATORY_CARE_PROVIDER_SITE_OTHER): Payer: 59 | Admitting: Obstetrics and Gynecology

## 2020-10-31 VITALS — BP 143/93 | HR 87 | Wt 181.0 lb

## 2020-10-31 DIAGNOSIS — O0990 Supervision of high risk pregnancy, unspecified, unspecified trimester: Secondary | ICD-10-CM | POA: Insufficient documentation

## 2020-10-31 DIAGNOSIS — G08 Intracranial and intraspinal phlebitis and thrombophlebitis: Secondary | ICD-10-CM

## 2020-10-31 DIAGNOSIS — O365930 Maternal care for other known or suspected poor fetal growth, third trimester, not applicable or unspecified: Secondary | ICD-10-CM

## 2020-10-31 DIAGNOSIS — O365990 Maternal care for other known or suspected poor fetal growth, unspecified trimester, not applicable or unspecified: Secondary | ICD-10-CM | POA: Insufficient documentation

## 2020-10-31 NOTE — Progress Notes (Signed)
   PRENATAL VISIT NOTE  Subjective:  Andrea Hayes is a 30 y.o. G1P0 at [redacted]w[redacted]d being seen today for ongoing prenatal care.  She is currently monitored for the following issues for this high-risk pregnancy and has Cerebral venous thrombosis; Acute right arterial ischemic stroke, middle cerebral artery (MCA) (HCC); Dural venous sinus thrombosis; Intracranial hypertension; Supervision of high risk pregnancy, antepartum; Headache in pregnancy, antepartum, first trimester; Thrombocytopenia affecting pregnancy (HCC); and IUGR (intrauterine growth restriction) affecting care of mother on their problem list.  Patient reports abdominal pruritis.  Contractions: Irritability. Vag. Bleeding: None.  Movement: Present. Denies leaking of fluid.   The following portions of the patient's history were reviewed and updated as appropriate: allergies, current medications, past family history, past medical history, past social history, past surgical history and problem list.   Objective:   Vitals:   10/31/20 1318  BP: (!) 143/93  Pulse: 87  Weight: 181 lb (82.1 kg)    Fetal Status: Fetal Heart Rate (bpm): 140 Fundal Height: 34 cm Movement: Present  Presentation: Vertex  General:  Alert, oriented and cooperative. Patient is in no acute distress.  Skin: Skin is warm and dry. No rash noted.   Cardiovascular: Normal heart rate noted  Respiratory: Normal respiratory effort, no problems with respiration noted  Abdomen: Soft, gravid, appropriate for gestational age.  Pain/Pressure: Present     Pelvic: Cervical exam performed in the presence of a chaperone Dilation: Fingertip Effacement (%): Thick Station: -3  Extremities: Normal range of motion.  Edema: Trace  Mental Status: Normal mood and affect. Normal behavior. Normal judgment and thought content.   Assessment and Plan:  Pregnancy: G1P0 at [redacted]w[redacted]d 1. Supervision of high risk pregnancy, antepartum Patient is doing well Cultures today Bile acids ordered  2.  Cerebral venous thrombosis Continue lovenox  3. Poor fetal growth affecting management of mother in third trimester, single or unspecified fetus Scheduled for IOL at 37 weeks per MFM  Preterm labor symptoms and general obstetric precautions including but not limited to vaginal bleeding, contractions, leaking of fluid and fetal movement were reviewed in detail with the patient. Please refer to After Visit Summary for other counseling recommendations.   Return for postpartum.  Future Appointments  Date Time Provider Department Center  11/02/2020  7:15 AM [facility] NURSE [facility]  11/02/2020  7:30 AM [facility] US2 [facility]  03/21/2021 10:30 AM Drema Dallas, DO [facility] None    Catalina Antigua, MD

## 2020-10-31 NOTE — Progress Notes (Signed)
ROB [redacted]w[redacted]d  GBS due today. Pt would like cervix check today.  CC: Back Pain. Itching on top of abdomen.  B/P elevated pt denies any HA's or visual changes.  *Repeated B/P : 137/95 P 91

## 2020-11-01 ENCOUNTER — Telehealth (HOSPITAL_COMMUNITY): Payer: Self-pay | Admitting: *Deleted

## 2020-11-01 LAB — CERVICOVAGINAL ANCILLARY ONLY
Chlamydia: NEGATIVE
Comment: NEGATIVE
Comment: NEGATIVE
Comment: NORMAL
Neisseria Gonorrhea: NEGATIVE
Trichomonas: NEGATIVE

## 2020-11-01 NOTE — Telephone Encounter (Signed)
Preadmission screen  

## 2020-11-02 ENCOUNTER — Other Ambulatory Visit (HOSPITAL_COMMUNITY)
Admission: RE | Admit: 2020-11-02 | Discharge: 2020-11-02 | Disposition: A | Discharge status: Home / Self Care | Payer: 59 | Source: Ambulatory Visit | Attending: Family Medicine | Admitting: Family Medicine

## 2020-11-02 ENCOUNTER — Other Ambulatory Visit: Payer: Self-pay

## 2020-11-02 ENCOUNTER — Ambulatory Visit: Payer: 59 | Admitting: *Deleted

## 2020-11-02 ENCOUNTER — Encounter: Payer: Self-pay | Admitting: *Deleted

## 2020-11-02 ENCOUNTER — Ambulatory Visit (HOSPITAL_BASED_OUTPATIENT_CLINIC_OR_DEPARTMENT_OTHER): Payer: 59

## 2020-11-02 VITALS — BP 136/92 | HR 84

## 2020-11-02 DIAGNOSIS — O365990 Maternal care for other known or suspected poor fetal growth, unspecified trimester, not applicable or unspecified: Secondary | ICD-10-CM

## 2020-11-02 DIAGNOSIS — Z20822 Contact with and (suspected) exposure to covid-19: Secondary | ICD-10-CM | POA: Insufficient documentation

## 2020-11-02 DIAGNOSIS — O99353 Diseases of the nervous system complicating pregnancy, third trimester: Secondary | ICD-10-CM

## 2020-11-02 DIAGNOSIS — O359XX0 Maternal care for (suspected) fetal abnormality and damage, unspecified, not applicable or unspecified: Secondary | ICD-10-CM

## 2020-11-02 DIAGNOSIS — O2693 Pregnancy related conditions, unspecified, third trimester: Secondary | ICD-10-CM | POA: Insufficient documentation

## 2020-11-02 DIAGNOSIS — O26893 Other specified pregnancy related conditions, third trimester: Secondary | ICD-10-CM | POA: Insufficient documentation

## 2020-11-02 DIAGNOSIS — Z3A36 36 weeks gestation of pregnancy: Secondary | ICD-10-CM | POA: Insufficient documentation

## 2020-11-02 DIAGNOSIS — Z362 Encounter for other antenatal screening follow-up: Secondary | ICD-10-CM

## 2020-11-02 DIAGNOSIS — O99323 Drug use complicating pregnancy, third trimester: Secondary | ICD-10-CM

## 2020-11-02 DIAGNOSIS — O365930 Maternal care for other known or suspected poor fetal growth, third trimester, not applicable or unspecified: Secondary | ICD-10-CM

## 2020-11-02 DIAGNOSIS — O2253 Cerebral venous thrombosis in pregnancy, third trimester: Secondary | ICD-10-CM

## 2020-11-02 DIAGNOSIS — I6350 Cerebral infarction due to unspecified occlusion or stenosis of unspecified cerebral artery: Secondary | ICD-10-CM

## 2020-11-02 LAB — STREP GP B NAA: Strep Gp B NAA: NEGATIVE

## 2020-11-02 LAB — CBC
Hematocrit: 40.7 % (ref 34.0–46.6)
Hemoglobin: 13.6 g/dL (ref 11.1–15.9)
MCH: 29.2 pg (ref 26.6–33.0)
MCHC: 33.4 g/dL (ref 31.5–35.7)
MCV: 88 fL (ref 79–97)
Platelets: 148 10*3/uL — ABNORMAL LOW (ref 150–450)
RBC: 4.65 x10E6/uL (ref 3.77–5.28)
RDW: 14.2 % (ref 11.7–15.4)
WBC: 9.8 10*3/uL (ref 3.4–10.8)

## 2020-11-02 LAB — BILE ACIDS, TOTAL: Bile Acids Total: 2.6 umol/L (ref 0.0–10.0)

## 2020-11-02 LAB — SARS CORONAVIRUS 2 (TAT 6-24 HRS): SARS Coronavirus 2: NEGATIVE

## 2020-11-02 NOTE — Progress Notes (Signed)
C/o" increased swelling in feet."

## 2020-11-04 ENCOUNTER — Encounter (HOSPITAL_COMMUNITY): Payer: Self-pay | Admitting: Obstetrics and Gynecology

## 2020-11-04 ENCOUNTER — Inpatient Hospital Stay (HOSPITAL_COMMUNITY): Payer: 59

## 2020-11-04 ENCOUNTER — Inpatient Hospital Stay (HOSPITAL_COMMUNITY)
Admission: AD | Admit: 2020-11-04 | Discharge: 2020-11-07 | DRG: 806 | Disposition: A | Discharge status: Home / Self Care | Payer: 59 | Attending: Obstetrics and Gynecology | Admitting: Obstetrics and Gynecology

## 2020-11-04 ENCOUNTER — Other Ambulatory Visit: Payer: Self-pay | Admitting: Neurology

## 2020-11-04 DIAGNOSIS — Z3490 Encounter for supervision of normal pregnancy, unspecified, unspecified trimester: Secondary | ICD-10-CM | POA: Diagnosis present

## 2020-11-04 DIAGNOSIS — R609 Edema, unspecified: Secondary | ICD-10-CM | POA: Diagnosis not present

## 2020-11-04 DIAGNOSIS — O88213 Thromboembolism in pregnancy, third trimester: Secondary | ICD-10-CM | POA: Diagnosis not present

## 2020-11-04 DIAGNOSIS — O99353 Diseases of the nervous system complicating pregnancy, third trimester: Secondary | ICD-10-CM | POA: Diagnosis not present

## 2020-11-04 DIAGNOSIS — O9912 Other diseases of the blood and blood-forming organs and certain disorders involving the immune mechanism complicating childbirth: Secondary | ICD-10-CM | POA: Diagnosis present

## 2020-11-04 DIAGNOSIS — O1092 Unspecified pre-existing hypertension complicating childbirth: Secondary | ICD-10-CM | POA: Diagnosis not present

## 2020-11-04 DIAGNOSIS — Z8673 Personal history of transient ischemic attack (TIA), and cerebral infarction without residual deficits: Secondary | ICD-10-CM

## 2020-11-04 DIAGNOSIS — Z3A37 37 weeks gestation of pregnancy: Secondary | ICD-10-CM

## 2020-11-04 DIAGNOSIS — O1414 Severe pre-eclampsia complicating childbirth: Secondary | ICD-10-CM | POA: Diagnosis present

## 2020-11-04 DIAGNOSIS — O1410 Severe pre-eclampsia, unspecified trimester: Secondary | ICD-10-CM

## 2020-11-04 DIAGNOSIS — D696 Thrombocytopenia, unspecified: Secondary | ICD-10-CM | POA: Diagnosis present

## 2020-11-04 DIAGNOSIS — O365930 Maternal care for other known or suspected poor fetal growth, third trimester, not applicable or unspecified: Secondary | ICD-10-CM | POA: Diagnosis present

## 2020-11-04 DIAGNOSIS — G08 Intracranial and intraspinal phlebitis and thrombophlebitis: Secondary | ICD-10-CM

## 2020-11-04 DIAGNOSIS — O114 Pre-existing hypertension with pre-eclampsia, complicating childbirth: Secondary | ICD-10-CM | POA: Diagnosis present

## 2020-11-04 DIAGNOSIS — O1002 Pre-existing essential hypertension complicating childbirth: Secondary | ICD-10-CM | POA: Diagnosis present

## 2020-11-04 DIAGNOSIS — I639 Cerebral infarction, unspecified: Secondary | ICD-10-CM | POA: Diagnosis not present

## 2020-11-04 DIAGNOSIS — Z86718 Personal history of other venous thrombosis and embolism: Secondary | ICD-10-CM

## 2020-11-04 DIAGNOSIS — Z9889 Other specified postprocedural states: Secondary | ICD-10-CM | POA: Diagnosis not present

## 2020-11-04 DIAGNOSIS — M79606 Pain in leg, unspecified: Secondary | ICD-10-CM | POA: Diagnosis not present

## 2020-11-04 DIAGNOSIS — Z20822 Contact with and (suspected) exposure to covid-19: Secondary | ICD-10-CM | POA: Diagnosis present

## 2020-11-04 DIAGNOSIS — O0990 Supervision of high risk pregnancy, unspecified, unspecified trimester: Secondary | ICD-10-CM

## 2020-11-04 DIAGNOSIS — Z7901 Long term (current) use of anticoagulants: Secondary | ICD-10-CM

## 2020-11-04 DIAGNOSIS — G932 Benign intracranial hypertension: Secondary | ICD-10-CM | POA: Diagnosis present

## 2020-11-04 DIAGNOSIS — I63511 Cerebral infarction due to unspecified occlusion or stenosis of right middle cerebral artery: Secondary | ICD-10-CM | POA: Diagnosis present

## 2020-11-04 DIAGNOSIS — O365990 Maternal care for other known or suspected poor fetal growth, unspecified trimester, not applicable or unspecified: Secondary | ICD-10-CM | POA: Diagnosis present

## 2020-11-04 LAB — COMPREHENSIVE METABOLIC PANEL
ALT: 12 U/L (ref 0–44)
AST: 18 U/L (ref 15–41)
Albumin: 2.1 g/dL — ABNORMAL LOW (ref 3.5–5.0)
Alkaline Phosphatase: 64 U/L (ref 38–126)
Anion gap: 6 (ref 5–15)
BUN: 11 mg/dL (ref 6–20)
CO2: 22 mmol/L (ref 22–32)
Calcium: 8.7 mg/dL — ABNORMAL LOW (ref 8.9–10.3)
Chloride: 106 mmol/L (ref 98–111)
Creatinine, Ser: 0.71 mg/dL (ref 0.44–1.00)
GFR, Estimated: >60 mL/min (ref >60)
Glucose, Bld: 86 mg/dL (ref 70–99)
Potassium: 4.1 mmol/L (ref 3.5–5.1)
Sodium: 134 mmol/L — ABNORMAL LOW (ref 135–145)
Total Bilirubin: 0.4 mg/dL (ref 0.3–1.2)
Total Protein: 5.9 g/dL — ABNORMAL LOW (ref 6.5–8.1)

## 2020-11-04 LAB — CBC
HCT: 42.2 % (ref 36.0–46.0)
HCT: 43.9 % (ref 36.0–46.0)
Hemoglobin: 14.1 g/dL (ref 12.0–15.0)
Hemoglobin: 14.7 g/dL (ref 12.0–15.0)
MCH: 29.6 pg (ref 26.0–34.0)
MCH: 29.5 pg (ref 26.0–34.0)
MCHC: 33.4 g/dL (ref 30.0–36.0)
MCHC: 33.5 g/dL (ref 30.0–36.0)
MCV: 88.7 fL (ref 80.0–100.0)
MCV: 88 fL (ref 80.0–100.0)
Platelets: 151 10*3/uL (ref 150–400)
Platelets: 171 10*3/uL (ref 150–400)
RBC: 4.76 MIL/uL (ref 3.87–5.11)
RBC: 4.99 MIL/uL (ref 3.87–5.11)
RDW: 13.8 % (ref 11.5–15.5)
RDW: 13.8 % (ref 11.5–15.5)
WBC: 7.1 10*3/uL (ref 4.0–10.5)
WBC: 8.8 10*3/uL (ref 4.0–10.5)
nRBC: 0 % (ref 0.0–0.2)
nRBC: 0 % (ref 0.0–0.2)

## 2020-11-04 LAB — TYPE AND SCREEN
ABO/RH(D): A POS
Antibody Screen: NEGATIVE

## 2020-11-04 LAB — PROTEIN / CREATININE RATIO, URINE
Creatinine, Urine: 65.14 mg/dL
Protein Creatinine Ratio: 11.39 mg/mg{Cre} — ABNORMAL HIGH (ref 0.00–0.15)
Total Protein, Urine: 742 mg/dL

## 2020-11-04 MED ORDER — LABETALOL HCL 5 MG/ML IV SOLN
40.0000 mg | INTRAVENOUS | Status: DC | PRN
  Administered 2020-11-04 (×2): 40 mg via INTRAVENOUS
  Filled 2020-11-04 (×3): qty 8

## 2020-11-04 MED ORDER — NIFEDIPINE ER OSMOTIC RELEASE 30 MG PO TB24
30.0000 mg | ORAL_TABLET | Freq: Every day | ORAL | Status: DC
  Administered 2020-11-04: 30 mg via ORAL
  Filled 2020-11-04: qty 1

## 2020-11-04 MED ORDER — OXYTOCIN BOLUS FROM INFUSION
333.0000 mL | Freq: Once | INTRAVENOUS | Status: AC
  Administered 2020-11-04: 333 mL via INTRAVENOUS

## 2020-11-04 MED ORDER — ACETAMINOPHEN 325 MG PO TABS
650.0000 mg | ORAL_TABLET | ORAL | Status: DC | PRN
  Administered 2020-11-05: 650 mg via ORAL
  Filled 2020-11-04: qty 2

## 2020-11-04 MED ORDER — COCONUT OIL OIL
1.0000 "application " | TOPICAL_OIL | Status: DC | PRN
  Administered 2020-11-05: 1 via TOPICAL

## 2020-11-04 MED ORDER — PRENATAL MULTIVITAMIN CH
1.0000 | ORAL_TABLET | Freq: Every day | ORAL | Status: DC
  Administered 2020-11-05 – 2020-11-07 (×3): 1 via ORAL
  Filled 2020-11-04 (×3): qty 1

## 2020-11-04 MED ORDER — TERBUTALINE SULFATE 1 MG/ML IJ SOLN: 0.2500 mg | Freq: Once | INTRAMUSCULAR | Status: DC | PRN

## 2020-11-04 MED ORDER — ONDANSETRON HCL 4 MG/2ML IJ SOLN: 4.0000 mg | INTRAMUSCULAR | Status: DC | PRN

## 2020-11-04 MED ORDER — MAGNESIUM SULFATE 40 GM/1000ML IV SOLN
2.0000 g/h | INTRAVENOUS | Status: AC
  Administered 2020-11-05: 2 g/h via INTRAVENOUS
  Filled 2020-11-04: qty 1000

## 2020-11-04 MED ORDER — LIDOCAINE HCL (PF) 1 % IJ SOLN
30.0000 mL | INTRAMUSCULAR | Status: AC | PRN
  Administered 2020-11-04: 30 mL via SUBCUTANEOUS
  Filled 2020-11-04: qty 30

## 2020-11-04 MED ORDER — MISOPROSTOL 50MCG HALF TABLET: 50.0000 ug | ORAL_TABLET | ORAL | Status: DC | PRN

## 2020-11-04 MED ORDER — FENTANYL CITRATE (PF) 100 MCG/2ML IJ SOLN
100.0000 ug | Freq: Once | INTRAMUSCULAR | Status: AC
  Administered 2020-11-04: 100 ug via INTRAVENOUS

## 2020-11-04 MED ORDER — NIFEDIPINE ER OSMOTIC RELEASE 30 MG PO TB24: 60.0000 mg | ORAL_TABLET | Freq: Every day | ORAL | Status: DC

## 2020-11-04 MED ORDER — LACTATED RINGERS IV SOLN: INTRAVENOUS | Status: DC

## 2020-11-04 MED ORDER — ONDANSETRON HCL 4 MG PO TABS: 4.0000 mg | ORAL_TABLET | ORAL | Status: DC | PRN

## 2020-11-04 MED ORDER — SOD CITRATE-CITRIC ACID 500-334 MG/5ML PO SOLN: 30.0000 mL | ORAL | Status: DC | PRN

## 2020-11-04 MED ORDER — SIMETHICONE 80 MG PO CHEW: 80.0000 mg | CHEWABLE_TABLET | ORAL | Status: DC | PRN

## 2020-11-04 MED ORDER — DIPHENHYDRAMINE HCL 50 MG/ML IJ SOLN: 12.5000 mg | INTRAMUSCULAR | Status: DC | PRN

## 2020-11-04 MED ORDER — ONDANSETRON HCL 4 MG/2ML IJ SOLN: 4.0000 mg | Freq: Four times a day (QID) | INTRAMUSCULAR | Status: DC | PRN

## 2020-11-04 MED ORDER — LABETALOL HCL 5 MG/ML IV SOLN
80.0000 mg | INTRAVENOUS | Status: DC | PRN
  Administered 2020-11-04: 80 mg via INTRAVENOUS
  Filled 2020-11-04: qty 16

## 2020-11-04 MED ORDER — OXYTOCIN-SODIUM CHLORIDE 30-0.9 UT/500ML-% IV SOLN: 2.5000 [IU]/h | INTRAVENOUS | Status: DC

## 2020-11-04 MED ORDER — TETANUS-DIPHTH-ACELL PERTUSSIS 5-2.5-18.5 LF-MCG/0.5 IM SUSY: 0.5000 mL | PREFILLED_SYRINGE | Freq: Once | INTRAMUSCULAR | Status: DC

## 2020-11-04 MED ORDER — BENZOCAINE-MENTHOL 20-0.5 % EX AERO
1.0000 "application " | INHALATION_SPRAY | CUTANEOUS | Status: DC | PRN
  Administered 2020-11-05 – 2020-11-07 (×2): 1 via TOPICAL
  Filled 2020-11-04 (×2): qty 56

## 2020-11-04 MED ORDER — LABETALOL HCL 5 MG/ML IV SOLN
20.0000 mg | INTRAVENOUS | Status: DC | PRN
  Administered 2020-11-04 (×2): 20 mg via INTRAVENOUS
  Filled 2020-11-04: qty 4

## 2020-11-04 MED ORDER — MAGNESIUM SULFATE BOLUS VIA INFUSION
4.0000 g | Freq: Once | INTRAVENOUS | Status: AC
  Administered 2020-11-04: 4 g via INTRAVENOUS
  Filled 2020-11-04: qty 1000

## 2020-11-04 MED ORDER — LACTATED RINGERS IV SOLN: 500.0000 mL | INTRAVENOUS | Status: DC | PRN

## 2020-11-04 MED ORDER — ACETAMINOPHEN 325 MG PO TABS: 650.0000 mg | ORAL_TABLET | ORAL | Status: DC | PRN

## 2020-11-04 MED ORDER — OXYTOCIN-SODIUM CHLORIDE 30-0.9 UT/500ML-% IV SOLN
1.0000 m[IU]/min | INTRAVENOUS | Status: DC
  Administered 2020-11-04: 2 m[IU]/min via INTRAVENOUS
  Filled 2020-11-04: qty 500

## 2020-11-04 MED ORDER — PHENYLEPHRINE 40 MCG/ML (10ML) SYRINGE FOR IV PUSH (FOR BLOOD PRESSURE SUPPORT): 80.0000 ug | PREFILLED_SYRINGE | INTRAVENOUS | Status: DC | PRN

## 2020-11-04 MED ORDER — MAGNESIUM SULFATE 40 GM/1000ML IV SOLN
2.0000 g/h | INTRAVENOUS | Status: DC
  Administered 2020-11-04: 2 g/h via INTRAVENOUS
  Filled 2020-11-04: qty 1000

## 2020-11-04 MED ORDER — IBUPROFEN 600 MG PO TABS
600.0000 mg | ORAL_TABLET | Freq: Four times a day (QID) | ORAL | Status: DC
  Administered 2020-11-05 – 2020-11-07 (×9): 600 mg via ORAL
  Filled 2020-11-04 (×9): qty 1

## 2020-11-04 MED ORDER — HYDRALAZINE HCL 20 MG/ML IJ SOLN
10.0000 mg | INTRAMUSCULAR | Status: DC | PRN
  Administered 2020-11-04: 10 mg via INTRAVENOUS
  Filled 2020-11-04: qty 1

## 2020-11-04 MED ORDER — OXYCODONE-ACETAMINOPHEN 5-325 MG PO TABS: 2.0000 | ORAL_TABLET | ORAL | Status: DC | PRN

## 2020-11-04 MED ORDER — LACTATED RINGERS IV SOLN: 500.0000 mL | Freq: Once | INTRAVENOUS | Status: DC

## 2020-11-04 MED ORDER — DIBUCAINE (PERIANAL) 1 % EX OINT: 1.0000 "application " | TOPICAL_OINTMENT | CUTANEOUS | Status: DC | PRN

## 2020-11-04 MED ORDER — FENTANYL CITRATE (PF) 100 MCG/2ML IJ SOLN
INTRAMUSCULAR | Status: AC
  Filled 2020-11-04: qty 2

## 2020-11-04 MED ORDER — EPHEDRINE 5 MG/ML INJ: 10.0000 mg | INTRAVENOUS | Status: DC | PRN

## 2020-11-04 MED ORDER — DOCUSATE SODIUM 100 MG PO CAPS
100.0000 mg | ORAL_CAPSULE | Freq: Two times a day (BID) | ORAL | Status: DC
  Administered 2020-11-05: 100 mg via ORAL
  Filled 2020-11-04: qty 1

## 2020-11-04 MED ORDER — WITCH HAZEL-GLYCERIN EX PADS: 1.0000 "application " | MEDICATED_PAD | CUTANEOUS | Status: DC | PRN

## 2020-11-04 MED ORDER — OXYCODONE-ACETAMINOPHEN 5-325 MG PO TABS
1.0000 | ORAL_TABLET | ORAL | Status: DC | PRN
Start: 2020-11-04 — End: 2020-11-04

## 2020-11-04 MED ORDER — FENTANYL-BUPIVACAINE-NACL 0.5-0.125-0.9 MG/250ML-% EP SOLN
12.0000 mL/h | EPIDURAL | Status: DC | PRN
  Filled 2020-11-04: qty 250

## 2020-11-04 MED ORDER — DIPHENHYDRAMINE HCL 25 MG PO CAPS: 25.0000 mg | ORAL_CAPSULE | Freq: Four times a day (QID) | ORAL | Status: DC | PRN

## 2020-11-04 MED ORDER — SENNOSIDES-DOCUSATE SODIUM 8.6-50 MG PO TABS
2.0000 | ORAL_TABLET | ORAL | Status: DC
  Administered 2020-11-05 (×2): 2 via ORAL
  Filled 2020-11-04 (×3): qty 2

## 2020-11-04 NOTE — Progress Notes (Signed)
Labor Progress Note Andrea Hayes is a 30 y.o. G1P0 at [redacted]w[redacted]d presented for IOL-cHTN, now with SIPE w/SF. S: Doing well without complaints.  O:  BP (!) 150/104   Pulse 81   Temp 98.1 F (36.7 C) (Oral)   Resp 16   Ht 5\' 5"  (1.651 m)   Wt 82.7 kg   LMP 02/14/2020   SpO2 97%   BMI 30.35 kg/m  EFM: baseline 120bpm/mod variability/+ accels/no decels Toco: q2-3 min  CVE: Dilation: 5 Effacement (%): 50 Station: -1 Presentation: Vertex Exam by:: [email or web address], MD   A&P: 30 y.o. G1P0 [redacted]w[redacted]d presented for IOL-cHTN, now with SIPE w/SF. #IOL: Pitocin started @1026 , currently at 64mL/hr. Continue to titrate. AROM risks/benefits discussed and performed without difficulty @1745 , clear fluid. #Pain: PRN, desires epidural #FWB: cat 1 #GBS negative #SIPE w/SF: Asymptomatic. Severe range BP requiring anti-htn protocol. On Mg. P/c ~11. Platelets and LFTs wnl. Continue to monitor. Will start procardia xl 30 mg qd given anti-hypertensive requirements throughout the day today. #History of stroke/central venous thrombosis: Lovenox 40mg  qd postpartum x6 weeks.  , MD 6:00 PM

## 2020-11-04 NOTE — Discharge Summary (Signed)
Postpartum Discharge Summary      Patient Name: Andrea Hayes DOB: Sep 21, 1990 MRN: [id number]  Date of admission: 11/04/2020 Delivery date:11/04/2020  Delivering provider: Janet Berlin  Date of discharge: 11/07/2020  Admitting diagnosis: Encounter for induction of labor [Z34.90] Intrauterine pregnancy: [redacted]w[redacted]d    Secondary diagnosis:  Principal Problem:   Severe preeclampsia, delivered Active Problems:   Cerebral venous thrombosis   Acute right arterial ischemic stroke, middle cerebral artery (MCA) (HCC)   Dural venous sinus thrombosis   Intracranial hypertension   Supervision of high risk pregnancy, antepartum   [place]   IUGR (intrauterine growth restriction) affecting care of mother  Additional problems: None    Discharge diagnosis: Term Pregnancy Delivered and Preeclampsia (severe)                                              Post partum procedures: None Augmentation: AROM and Pitocin Complications: None  Hospital course: Induction of Labor With Vaginal Delivery   30y.o. yo G1P0 at 34w0das admitted to the hospital 11/04/2020 for induction of labor.  Indication for induction: FGR. Patient held her lovenox for 24 hours prior to coming in for induction. She was initiated on magnesium for severe range blood pressures and received multiple doses of IV antihypertensives. Patient had an uncomplicated labor course as follows: Membrane Rupture Time/Date: 5:46 PM ,11/04/2020   Delivery Method:Vaginal, Spontaneous  Episiotomy: None  Lacerations:  2nd degree;Perineal  Details of delivery can be found in separate delivery note.  Patient was continued on magnesium for 24 hours postpartum as per protocol. Her lovenox was restarted on PPD#1. She was started on 60 mg procardia XL for BP control. Patient otherwise had a routine postpartum course. Patient is discharged home 11/07/20.  Newborn Data: Birth date:11/04/2020  Birth time:8:42 PM  Gender:Female   Living status:Living  Apgars:8 ,9  Weight:2180 g   Magnesium Sulfate received: Yes: Seizure prophylaxis BMZ received: No Rhophylac:N/A MMR:N/A T-DaP:Given prenatally Flu: No Transfusion:No  Physical exam  Vitals:   11/06/20 2348 11/07/20 0549 11/07/20 0550 11/07/20 0748  BP: 119/78 (!) 140/96  129/84  Pulse: (!) 114 (!) 119  (!) 103  Resp: 16 18  18   Temp: 98.9 F (37.2 C) 98.9 F (37.2 C)  98.3 F (36.8 C)  TempSrc: Oral Oral  Oral  SpO2: 96% 99% 98% 98%  Weight:      Height:       General: alert, cooperative and no distress Lochia: appropriate Uterine Fundus: firm, NT DVT Evaluation: No evidence of DVT seen on physical exam. Negative Homan's sign. No cords or calf tenderness. 2+ BLE edema, symmetric.  Labs: Lab Results  Component Value Date   WBC 8.8 11/04/2020   HGB 14.7 11/04/2020   HCT 43.9 11/04/2020   MCV 88.0 11/04/2020   PLT 171 11/04/2020   CMP Latest Ref Rng & Units 11/04/2020  Glucose 70 - 99 mg/dL 86  BUN 6 - 20 mg/dL 11  Creatinine 0.44 - 1.00 mg/dL 0.71  Sodium 135 - 145 mmol/L 134(L)  Potassium 3.5 - 5.1 mmol/L 4.1  Chloride 98 - 111 mmol/L 106  CO2 22 - 32 mmol/L 22  Calcium 8.9 - 10.3 mg/dL 8.7(L)  Total Protein 6.5 - 8.1 g/dL 5.9(L)  Total Bilirubin 0.3 - 1.2 mg/dL 0.4  Alkaline Phos 38 - 126 U/L 64  AST  15 - 41 U/L 18  ALT 0 - 44 U/L 12   Edinburgh Score: Edinburgh Postnatal Depression Scale Screening Tool 11/05/2020  I have been able to laugh and see the funny side of things. 0  I have looked forward with enjoyment to things. 0  I have blamed myself unnecessarily when things went wrong. 2  I have been anxious or worried for no good reason. 2  I have felt scared or panicky for no good reason. 1  Things have been getting on top of me. 1  I have been so unhappy that I have had difficulty sleeping. 1  I have felt sad or miserable. 1  I have been so unhappy that I have been crying. 2  The thought of harming myself has occurred to me. 0   Edinburgh Postnatal Depression Scale Total 10     After visit meds:  Allergies as of 11/07/2020   No Known Allergies     Medication List    STOP taking these medications   acetaZOLAMIDE 250 MG tablet Commonly known as: [place] Supp Lg Misc   enoxaparin 80 MG/0.8ML injection Commonly known as: LOVENOX Replaced by: enoxaparin 40 MG/0.4ML injection   ondansetron 4 MG disintegrating tablet Commonly known as: Zofran ODT     TAKE these medications   acetaminophen 325 MG tablet Commonly known as: TYLENOL Take 2 tablets (650 mg total) by mouth every 6 (six) hours as needed for mild pain or headache (do not take over 3000 mg in a day of tylenol. please note there is tylenol in the fiorecet).   Blood Pressure Kit Devi 1 kit by Does not apply route as needed.   cyclobenzaprine 5 MG tablet Commonly known as: FLEXERIL Take 1 tablet (5 mg total) by mouth 3 (three) times daily as needed for muscle spasms.   enoxaparin 40 MG/0.4ML injection Commonly known as: LOVENOX Inject 0.4 mLs (40 mg total) into the skin daily. Replaces: enoxaparin 80 MG/0.8ML injection   furosemide 20 MG tablet Commonly known as: LASIX Take 1 tablet (20 mg total) by mouth daily for 3 days.   ibuprofen 600 MG tablet Commonly known as: ADVIL Take 1 tablet (600 mg total) by mouth every 6 (six) hours as needed for headache, mild pain, moderate pain or cramping.   NIFEdipine 60 MG 24 hr tablet Commonly known as: ADALAT CC Take 1 tablet (60 mg total) by mouth daily.   potassium chloride SA 20 MEQ tablet Commonly known as: KLOR-CON Take 1 tablet (20 mEq total) by mouth 2 (two) times daily for 3 days.   Prenatal Vitamin 27-0.8 MG Tabs Take 1 tablet by mouth daily.   senna-docusate 8.6-50 MG tablet Commonly known as: Senokot-S Take 2 tablets by mouth at bedtime as needed for mild constipation or moderate constipation.        Discharge home in stable condition Infant Feeding:  Breast Infant Disposition:home with mother Discharge instruction: per After Visit Summary and Postpartum booklet. Activity: Advance as tolerated. Pelvic rest for 6 weeks.  Diet: routine diet Future Appointments: Future Appointments  Date Time Provider [place]  03/21/2021 10:30 AM [name], DO [facility] None   Follow up Visit:  [place] Follow up on 11/11/2020.   Specialty: Obstetrics and Gynecology Why: BP check. You will be called with appointment details Contact information: [address] [place] [phone]  Please schedule this patient for a In person postpartum visit in 4 weeks with the following provider: MD. Additional Postpartum F/U:BP check later this week  High risk pregnancy complicated by: central venous thrombosis on lovenox, FGR, PEC w SF Delivery mode:  Vaginal, Spontaneous  Anticipated Birth Control:  Unsure   11/07/2020 Verita Schneiders, MD

## 2020-11-04 NOTE — H&P (Signed)
OBSTETRIC ADMISSION HISTORY AND PHYSICAL  Andrea Hayes is a 30 y.o. female G1P0 with IUP at 19w0dby 8 wk u/s presenting for IOL-FGR. She reports +FMs, No LOF, no VB, no blurry vision, headaches or peripheral edema, and RUQ pain.  She plans on breast feeding. She declines birth control. [facility] --->  Estimated Date of Delivery: 11/25/20  Sono:    10/26/20@[redacted]w[redacted]d , CWD, normal anatomy, cephalic presentation, anterior placental lie, 2096g, 3% EFW   Prenatal History/Complications:  FGR Language barrier ([place] Intracranial htn History of central venous thrombosis (Lovenox)  Past Medical History: Past Medical History:  Diagnosis Date  . Stroke ([facility]     Past Surgical History: Past Surgical History:  Procedure Laterality Date  . NO PAST SURGERIES      Obstetrical History: OB History    Gravida  1   Para      Term      Preterm      AB      Living        SAB      IAB      Ectopic      Multiple      Live Births              Social History Social History   Socioeconomic History  . Marital status: Single    Spouse name: Not on file  . Number of children: Not on file  . Years of education: Not on file  . Highest education level: Not on file  Occupational History  . Not on file  Tobacco Use  . Smoking status: Never Smoker  . Smokeless tobacco: Never Used  Vaping Use  . Vaping Use: Never used  Substance and Sexual Activity  . Alcohol use: No    Alcohol/week: 0.0 standard drinks  . Drug use: No  . Sexual activity: Yes    Birth control/protection: None  Other Topics Concern  . Not on file  Social History Narrative   Right handed   Social Determinants of Health   Financial Resource Strain: Not on file  Food Insecurity: Not on file  Transportation Needs: Not on file  Physical Activity: Not on file  Stress: Not on file  Social Connections: Not on file    Family History: Family History   Problem Relation Age of Onset  . Asthma Father   . Obesity Neg Hx   . Hypertension Neg Hx   . Diabetes Neg Hx   . Cancer Neg Hx     Allergies: No Known Allergies  Medications Prior to Admission  Medication Sig Dispense Refill Last Dose  . acetaminophen (TYLENOL) 325 MG tablet Take 2 tablets (650 mg total) by mouth every 6 (six) hours as needed for mild pain or headache (do not take over 3000 mg in a day of tylenol. please note there is tylenol in the fiorecet).     .Marland KitchenacetaZOLAMIDE (DIAMOX) 250 MG tablet Take by mouth. (Patient not taking: No sig reported)     . Blood Pressure Monitoring (BLOOD PRESSURE KIT) DEVI 1 kit by Does not apply route as needed. (Patient not taking: Reported on 09/14/2020) 1 each 0   . cyclobenzaprine (FLEXERIL) 5 MG tablet Take 1 tablet (5 mg total) by mouth 3 (three) times daily as needed for muscle spasms. 20 tablet 0   . Elastic Bandages & Supports (COMFORT FIT MATERNITY SUPP LG) MISC 1 Units by Does not  apply route daily. (Patient not taking: Reported on 07/15/2020) 1 each 0   . enoxaparin (LOVENOX) 80 MG/0.8ML injection Inject 0.8 mLs (80 mg total) into the skin every 12 (twelve) hours. 144 mL 3   . ondansetron (ZOFRAN ODT) 4 MG disintegrating tablet Take 1 tablet (4 mg total) by mouth every 8 (eight) hours as needed for nausea or vomiting. (Patient not taking: Reported on 10/31/2020) 20 tablet 0   . Prenatal Vit-Fe Fumarate-FA (PRENATAL VITAMIN) 27-0.8 MG TABS Take 1 tablet by mouth daily. 30 tablet 12      Review of Systems   All systems reviewed and negative except as stated in HPI  Last menstrual period 02/14/2020. General appearance: alert, cooperative and no distress Lungs: normal respiratory effort Heart: regular rate and rhythm Abdomen: soft, non-tender; gravid Pelvic: as noted below Extremities: Homans sign is negative, no sign of DVT Presentation: cephalic by cervical exam Fetal monitoringBaseline: 135 bpm, Variability: Good {> 6 bpm),  Accelerations: Reactive and Decelerations: Absent Uterine activityFrequency: Every 2-5 minutes     Prenatal labs: ABO, Rh: A/Positive/-- (11/04 1432) Antibody: Negative (11/04 1432) Rubella: 17.50 (11/04 1432) RPR: Non Reactive (02/04 1009)  HBsAg: Negative (11/04 1432)  HIV: Non Reactive (02/04 1009)  GBS: Negative/-- (05/02 0141)  2 hr Glucola passed Genetic screening  normal Anatomy US normal  Prenatal Transfer Tool  Maternal Diabetes: No Genetic Screening: Normal Maternal Ultrasounds/Referrals: IUGR Fetal Ultrasounds or other Referrals:  Referred to Materal Fetal Medicine  Maternal Substance Abuse:  No Significant Maternal Medications:  Meds include: Other: Lovenox Significant Maternal Lab Results: Group B Strep negative  No results found for this or any previous visit (from the past 24 hour(s)).  Patient Active Problem List   Diagnosis Date Noted  . IUGR (intrauterine growth restriction) affecting care of mother 10/31/2020  . Thrombocytopenia affecting pregnancy ([place]) 05/27/2020  . Headache in pregnancy, antepartum, first trimester 05/05/2020  . Supervision of high risk pregnancy, antepartum 04/28/2020  . Acute right arterial ischemic stroke, middle cerebral artery (MCA) ([place])   . Dural venous sinus thrombosis   . Intracranial hypertension   . Cerebral venous thrombosis 07/24/2018    Assessment/Plan:  Andrea Hayes is a 30 y.o. G1P0 at 67w0dhere for IOL-FGR.  #IOL: Discussed IOL process with patient. Given cervical exam will start pitocin at this time and AROM when able. #Pain: PRN, desires epidural #FWB:  cat 1 #ID: GBS neg #MOF: breast #MOC:declines #Circ: n/a  #Language barrier: [employer] interpreter used for entirety of visit  #Intracranial htn: asymptomatic currently, continue to monitor.  #History of central venous thrombosis: previously on lovenox 86mBID, held for 1 week prior to presentation. restart after delivery.  AlArrie SenateMD   11/04/2020, 9:13 AM

## 2020-11-04 NOTE — Progress Notes (Signed)
Labor Progress Note Andrea Hayes is a 30 y.o. G1P0 at [redacted]w[redacted]d presented for IOL-cHTN, now with SIPE w/SF. S: Doing well without complaints.  O:  BP (!) 165/95   Pulse 79   Temp 98.1 F (36.7 C) (Oral)   Resp 16   Ht 5\' 5"  (1.651 m)   Wt 82.7 kg   LMP 02/14/2020   SpO2 97%   BMI 30.35 kg/m  EFM: baseline 120bpm/mod variability/+ accels/no decels Toco: q2-3 min  CVE: Dilation: 3 Effacement (%): 80 Station: -1 Presentation: Vertex Exam by:: [email or web address] RN   A&P: 30 y.o. G1P0 [redacted]w[redacted]d presented for IOL-cHTN, now with SIPE w/SF. #IOL: Pitocin started @1026 , currently at 44mL/hr. Continue to titrate. AROM when able. #Pain: PRN, desires epidural #FWB: cat 1 #GBS negative #SIPE w/SF: Asymptomatic. Severe range BP requiring anti-htn protocol. On Mg. P/c ~11. Platelets and LFTs wnl. Continue to monitor. #History of stroke/central venous thrombosis: Lovenox 40mg  qd postpartum x6 weeks.  , MD 3:04 PM

## 2020-11-04 NOTE — Lactation Note (Signed)
This note was copied from a baby's chart. Lactation Consultation Note  Patient Name: Andrea Hayes Today's Date: 11/04/2020 Reason for consult: L&D Initial assessment;Mother's request;1st time breastfeeding;Primapara;Early term 37-38.6wks;Other (Comment) (GHTN (Mag)) Age:31 hours   Mom trying to latch infant on arrival. LC demonstrated hand expression and drops of colostrum given. Infant latched in cross cradle on the right breast with signs of milk transfer.   Mom to receive further LC support on the floor. Mom has WIC and does not have a pump at home.   Maternal Data Has patient been taught Hand Expression?: Yes Does the patient have breastfeeding experience prior to this delivery?: No  Feeding Mother's Current Feeding Choice: Breast Milk  LATCH Score Latch: Repeated attempts needed to sustain latch, nipple held in mouth throughout feeding, stimulation needed to elicit sucking reflex.  Audible Swallowing: A few with stimulation  Type of Nipple: Everted at rest and after stimulation  Comfort (Breast/Nipple): Soft / non-tender  Hold (Positioning): Assistance needed to correctly position infant at breast and maintain latch.  LATCH Score: 7   Lactation Tools Discussed/Used    Interventions Interventions: Breast feeding basics reviewed;Breast compression;Assisted with latch;Adjust position;Skin to skin;Support pillows;Breast massage;Position options;Hand express;Expressed milk;Education  Discharge WIC Program: Yes  Consult Status Consult Status: Follow-up Date: 11/05/20 Follow-up type: In-patient    Bright Spielmann  Nicholson-Springer 11/04/2020, 9:51 PM

## 2020-11-05 ENCOUNTER — Encounter (HOSPITAL_COMMUNITY): Payer: Self-pay | Admitting: Obstetrics and Gynecology

## 2020-11-05 ENCOUNTER — Other Ambulatory Visit: Payer: Self-pay

## 2020-11-05 DIAGNOSIS — Z7901 Long term (current) use of anticoagulants: Secondary | ICD-10-CM

## 2020-11-05 DIAGNOSIS — O365930 Maternal care for other known or suspected poor fetal growth, third trimester, not applicable or unspecified: Secondary | ICD-10-CM | POA: Diagnosis not present

## 2020-11-05 DIAGNOSIS — I639 Cerebral infarction, unspecified: Secondary | ICD-10-CM

## 2020-11-05 DIAGNOSIS — O88213 Thromboembolism in pregnancy, third trimester: Secondary | ICD-10-CM

## 2020-11-05 DIAGNOSIS — O99353 Diseases of the nervous system complicating pregnancy, third trimester: Secondary | ICD-10-CM | POA: Diagnosis not present

## 2020-11-05 LAB — RPR: RPR Ser Ql: NONREACTIVE

## 2020-11-05 MED ORDER — ENOXAPARIN SODIUM 40 MG/0.4ML IJ SOSY
40.0000 mg | PREFILLED_SYRINGE | INTRAMUSCULAR | Status: DC
  Administered 2020-11-05 – 2020-11-06 (×2): 40 mg via SUBCUTANEOUS
  Filled 2020-11-05 (×2): qty 0.4

## 2020-11-05 MED ORDER — NIFEDIPINE ER OSMOTIC RELEASE 30 MG PO TB24
30.0000 mg | ORAL_TABLET | Freq: Every day | ORAL | Status: DC
  Administered 2020-11-05 – 2020-11-06 (×2): 30 mg via ORAL
  Filled 2020-11-05 (×2): qty 1

## 2020-11-05 NOTE — Lactation Note (Signed)
This note was copied from a baby's chart. Lactation Consultation Note At time of start of consult baby almost 5 hrs old. LC has spent over an hour working w/mom.  Mom was trying to give Donor milk in bottle when LC came into rm. Mom stated baby was finished. Now she is sleepy.  Mom stated baby has been to the breast but she doesn't have any milk so she doesn't think she is getting anything. LC asked mom if she has seen any colostrum, mom stated she doesn't have any milk. LC asked if LC can demonstrated hand expression, mom agreed. LC demonstrated hand expression w/ colostrum pouring out. praised mom. Mom excited but continues to say she doesn't have milk. LC explained the importance of colostrum and how that is a part of milk. Collected 10 ml easily. Milk storage reviewed.  Mom wanted to try to latch baby. Baby not very eager. Discussed feeding positions. Baby would open and get onto nipple but wasn't taking everted nipples back into mouth, just occasionally sucking on the nipple. LC wonders if baby's mouth is to small for mom's nipples at this time. Hopefully in another day or so baby will open wider therefore taking more than just the nipple when latching.  Mom shown how to use DEBP & how to disassemble, clean, & reassemble parts. Mom knows to pump q3h for 15-20 min.  Mom encouraged to feed baby 8-12 times/24 hours and with feeding cues.  LPI information sheet given and reviewed importance of supplementing after BF and not tiring baby.  LC reviewed the importance of I&O d/t baby's small weight, STS, supply and demand.  LC gave baby 5 ml mom's EBM w/purple slow flow nipple. Mom had been using yellow nipples. Mom happy baby took that much. Mom pumped while LC giving baby EBM.  Mom has WIC. Mom signed for [facility] referral for DEBP. LC will fax information.  Lactation brochure given. Mom thanks [facility] for assistance.  Patient Name: Andrea Hayes Today's Date: 11/05/2020 Reason for  consult: Initial assessment;Primapara;Infant < 6lbs;Early term 37-38.6wks Age:7 hours  Maternal Data Has patient been taught Hand Expression?: Yes Does the patient have breastfeeding experience prior to this delivery?: No  Feeding    LATCH Score Latch: Too sleepy or reluctant, no latch achieved, no sucking elicited.  Audible Swallowing: None  Type of Nipple: Everted at rest and after stimulation  Comfort (Breast/Nipple): Soft / non-tender  Hold (Positioning): Full assist, staff holds infant at breast  LATCH Score: 4   Lactation Tools Discussed/Used Tools: Pump Breast pump type: Double-Electric Breast Pump Pump Education: Setup, frequency, and cleaning;Milk Storage Reason for Pumping: less than 5 lbs Pumping frequency: q 3 hrs Pumped volume: 2 mL  Interventions Interventions: Breast feeding basics reviewed;Support pillows;Assisted with latch;Position options;Skin to skin;Expressed milk;Breast massage;Hand express;Breast compression;Adjust position;DEBP  Discharge Pump: DEBP WIC Program: Yes  Consult Status Consult Status: Follow-up Date: 11/05/20 Follow-up type: In-patient    Charyl Dancer 11/05/2020, 2:49 AM

## 2020-11-05 NOTE — Lactation Note (Addendum)
This note was copied from a baby's chart. Lactation Consultation Note  Patient Name: Andrea Hayes Today's Date: 11/05/2020 Reason for consult: Follow-up assessment;1st time breastfeeding;Primapara;Early term 37-38.6wks;Infant < 6lbs Age:31 hours  Mom on MgSO4 currently  LC in to visit with P1 Mom of ET infant weighing 4 lbs 12.7 oz at birth and noted to be at 3.6% weight loss this am.   Pecola Leisure has breastfed twice and supplemented with 10 ml of colostrum by bottle.  Baby currently in CN for low temp.  Assisted Mom to pump on initiation setting, and changed flange size from 27 mm to 24 mm for a better fit.  Recommendations written on Mom's dry erase board in room.  Plan- 1- STS as much as possible 2- Offer breast with cues, asking for help prn 3- supplement with 5-10 ml increasing volume as tolerated EBM +/donor milk by slow flow bottle 4- Pump both breasts on initiation setting, adding breast massage and hand expression to support a full milk supply.  Mom aware of lactation support available to her and encouraged to ask for help prn.   Lactation Tools Discussed/Used Tools: Pump;Flanges Flange Size: 24 Breast pump type: Double-Electric Breast Pump Pumping frequency: Q 3 hrs (Mom didn't pump all night) Pumped volume: 10 mL  Interventions Interventions: Breast feeding basics reviewed;Skin to skin;Breast massage;Hand express;DEBP;Support pillows   Consult Status Consult Status: Follow-up Date: 11/06/20 Follow-up type: In-patient    Andrea Hayes 11/05/2020, 8:43 AM

## 2020-11-05 NOTE — Plan of Care (Signed)
  Problem: Education: Goal: Knowledge of disease or condition will improve Outcome: Progressing   

## 2020-11-06 MED ORDER — SODIUM CHLORIDE 0.9% FLUSH
3.0000 mL | Freq: Two times a day (BID) | INTRAVENOUS | Status: DC
  Administered 2020-11-06 – 2020-11-07 (×2): 3 mL via INTRAVENOUS

## 2020-11-06 MED ORDER — NIFEDIPINE ER OSMOTIC RELEASE 30 MG PO TB24
30.0000 mg | ORAL_TABLET | Freq: Once | ORAL | Status: AC
  Administered 2020-11-06: 30 mg via ORAL
  Filled 2020-11-06: qty 1

## 2020-11-06 MED ORDER — NIFEDIPINE ER OSMOTIC RELEASE 30 MG PO TB24
60.0000 mg | ORAL_TABLET | Freq: Every day | ORAL | Status: DC
  Administered 2020-11-07: 60 mg via ORAL
  Filled 2020-11-06: qty 2

## 2020-11-06 MED ORDER — FUROSEMIDE 40 MG PO TABS
20.0000 mg | ORAL_TABLET | Freq: Every day | ORAL | Status: DC
  Administered 2020-11-06 – 2020-11-07 (×2): 20 mg via ORAL
  Filled 2020-11-06 (×2): qty 1

## 2020-11-06 MED ORDER — POTASSIUM CHLORIDE CRYS ER 20 MEQ PO TBCR
20.0000 meq | EXTENDED_RELEASE_TABLET | Freq: Two times a day (BID) | ORAL | Status: DC
  Administered 2020-11-06 – 2020-11-07 (×2): 20 meq via ORAL
  Filled 2020-11-06 (×2): qty 1

## 2020-11-06 NOTE — Progress Notes (Signed)
Post Partum Day 1 Subjective: no complaints, up ad lib, voiding and tolerating PO  Objective: Blood pressure (!) 129/93, pulse 97, temperature 98.5 F (36.9 C), temperature source Oral, resp. rate 18, height 5\' 5"  (1.651 m), weight 82.7 kg, last menstrual period 02/14/2020, SpO2 98 %, unknown if currently breastfeeding.  Physical Exam:  General: alert, cooperative and no distress Lochia: appropriate Uterine Fundus: firm Incision: na DVT Evaluation: No evidence of DVT seen on physical exam.  Recent Labs    11/04/20 0942 11/04/20 1939  HGB 14.1 14.7  HCT 42.2 43.9    Assessment/Plan: Plan for discharge tomorrow, Breastfeeding and Circumcision prior to discharge   LOS: 2 days   01/04/21

## 2020-11-06 NOTE — Lactation Note (Signed)
This note was copied from a baby's chart. Lactation Consultation Note LC to room for f/u visit. Baby was breastfeeding during visit with wide latch and rhythmic suckling. A few audible swallows heard. Infant wt loss and output are wnl at this time. Mom denies pain with bf. She continues to wake baby q3 to bf and supplements per protocol p bf. LC demonstrated use of hand pump. No s/s engorgement at this time. Reviewed importance of frequent milk removal in early days pp to avoid. Video interpreter ipad in room but mother declined use and communicated with LC in [place].   Patient Name: Andrea Hayes Today's Date: 11/06/2020 Reason for consult: Follow-up assessment;Early term 37-38.6wks Age:76 hours  Feeding Mother's Current Feeding Choice: Breast Milk and Donor Milk   Interventions Interventions: Education;Ice;Breast feeding basics reviewed  Discharge Discharge Education: Engorgement and breast care  Consult Status Consult Status: Follow-up Follow-up type: In-patient   Elder Negus, MA IBCLC 11/06/2020, 8:37 AM

## 2020-11-06 NOTE — Progress Notes (Addendum)
CSW received consult due to score 10 on Edinburgh Depression Screen.    When CSW met with MOB she was resting in bed and infant was asleep in the bassinet. MOB's mother was also present and MOB gave CSW permission to complete the assessment while her mom was present. CSW explained CSW's role and MOB was receptive to meeting with CSW. CSW was polite and easy to engage. CSW reviewed MOB's [place] results and discussed natural interventions that MOB can utilized if PMAD symptoms start to present. CSW provided education regarding Baby Blues vs PMADs and provided MOB with resources for mental health follow up.  CSW encouraged MOB to evaluate her mental health throughout the postpartum period with the use of the New Mom Checklist developed by Postpartum Progress (printed in [place]) as well as the [place] Postnatal Depression Scale and notify a medical professional if symptoms arise. MOB presented with insight and awareness and did not demonstrate any acute MH symptoms.  MOB reported having a good support team and communicated feeling comfortable seeking help if needed. CSW assessed for safety and MOB denied SI, HI, and DV.   CSW reported having all essential items to care for infant and feeling prepared to care for infant post discharge. MOB requested a list of pediatrician; CSW provided MOB with a list. However, MOB expressed interest in infant receiving follow-up care with [facility].   There are no barriers to discharge.   Laurey Arrow, MSW, LCSW Clinical Social Work [phone]

## 2020-11-06 NOTE — Progress Notes (Signed)
Post Partum Day 2 Subjective: No complaints, up ad lib, voiding and tolerating PO.  Reports BM x 2. Complains of bilateral leg swelling. Patient denies any headaches, visual symptoms, RUQ/epigastric pain or other concerning symptoms.    Objective: Blood pressure (!) 147/82, pulse 88, temperature 98.6 F (37 C), temperature source Oral, resp. rate 18, height 5\' 5"  (1.651 m), weight 82.7 kg, last menstrual period 02/14/2020, SpO2 98 %, unknown if currently breastfeeding.  Physical Exam:  General: alert, cooperative and no distress Lochia: appropriate Uterine Fundus: firm Incision: na DVT Evaluation: BLE 2+ edema, symmetric, cords, no calf tenderness.  No evidence of DVT seen on physical exam.  Recent Labs    11/04/20 0942 11/04/20 1939  HGB 14.1 14.7  HCT 42.2 43.9    Assessment/Plan: Severe PEC on CHTN: Off magnesium sulfate.  Continue Procardia 60 qd. Lasix given for edema. Continue to watch BP. If stable, home tomorrow.  H/P central venous thrombosis: Continue Lovenox 40 mg qd Routine postpartum care Breastfeeding, baby is doing well.    LOS: 2 days   01/04/21, MD

## 2020-11-07 ENCOUNTER — Other Ambulatory Visit (HOSPITAL_COMMUNITY): Payer: Self-pay

## 2020-11-07 MED ORDER — IBUPROFEN 600 MG PO TABS
600.0000 mg | ORAL_TABLET | Freq: Four times a day (QID) | ORAL | 0 refills | Status: DC | PRN
  Filled 2020-11-07: qty 30, 8d supply, fill #0

## 2020-11-07 MED ORDER — NIFEDIPINE ER 60 MG PO TB24
60.0000 mg | ORAL_TABLET | Freq: Every day | ORAL | 0 refills | Status: DC
  Filled 2020-11-07: qty 30, 30d supply, fill #0

## 2020-11-07 MED ORDER — ENOXAPARIN SODIUM 40 MG/0.4ML IJ SOSY
40.0000 mg | PREFILLED_SYRINGE | INTRAMUSCULAR | 0 refills | Status: DC
  Filled 2020-11-07: qty 16.8, 42d supply, fill #0

## 2020-11-07 MED ORDER — SENNOSIDES-DOCUSATE SODIUM 8.6-50 MG PO TABS
2.0000 | ORAL_TABLET | Freq: Every evening | ORAL | 0 refills | Status: DC | PRN
  Filled 2020-11-07: qty 30, 15d supply, fill #0

## 2020-11-07 MED ORDER — POTASSIUM CHLORIDE CRYS ER 20 MEQ PO TBCR
20.0000 meq | EXTENDED_RELEASE_TABLET | Freq: Two times a day (BID) | ORAL | 0 refills | Status: DC
  Filled 2020-11-07: qty 6, 3d supply, fill #0

## 2020-11-07 MED ORDER — FUROSEMIDE 20 MG PO TABS
20.0000 mg | ORAL_TABLET | Freq: Every day | ORAL | 0 refills | Status: DC
  Filled 2020-11-07: qty 3, 3d supply, fill #0

## 2020-11-07 NOTE — Discharge Instructions (Signed)

## 2020-11-07 NOTE — Lactation Note (Signed)
This note was copied from a baby's chart. Lactation Consultation Note  Patient Name: Andrea Hayes Today's Date: 11/07/2020 Reason for consult: Follow-up assessment  Per mom prefers speaking in [place].  Age:30 years 7 % weight loss ,  Per mom baby recently fed at 7 am for 25 mins per mom.  Baby awake and hungry, LC checked and changed a yellow/ brown stool/ med size. LC assisted to show mom the football position on the right breast/ baby latched easily with depth/ fed 22 mins and was satisfied. After feeding the baby LC reviewed the settings on the pump and had mom pump both breast for 15 mins with  22 ml EBM yield with a #24 F ( per mom comfortable. Baby sound asleep . LC recommended keeping the milk for the next feeding.  LC reviewed the [facility] plan for a Early term less than 5 pound baby with 7 % weight loss .   WIC referral was sent on 5/7 and per mom WIC Has not called her as of yet.  LC encouraged mom to call WIC this am and gave her a written list to discuss with WIC .   See below for D/C teaching.   Maternal Data Has patient been taught Hand Expression?: Yes Does the patient have breastfeeding experience prior to this delivery?: No  Feeding    LATCH Score Latch: Grasps breast easily, tongue down, lips flanged, rhythmical sucking.  Audible Swallowing: Spontaneous and intermittent  Type of Nipple: Everted at rest and after stimulation  Comfort (Breast/Nipple): Soft / non-tender  Hold (Positioning): Assistance needed to correctly position infant at breast and maintain latch.  LATCH Score: 9   Lactation Tools Discussed/Used    Interventions Interventions: Breast feeding basics reviewed;Assisted with latch;Skin to skin;Breast massage;Hand express;Breast compression;Adjust position;Support pillows;Position options;Hand pump;DEBP;Education  Discharge Discharge Education: Engorgement and breast care;Warning signs for feeding baby Pump: DEBP;Manual WIC Program:  Yes  Consult Status Consult Status: Complete Date: 11/07/20    Kathrin Greathouse 11/07/2020, 9:21 AM

## 2020-11-07 NOTE — Plan of Care (Signed)
  Problem: Education: Goal: Knowledge of condition will improve Outcome: Adequate for Discharge Goal: Individualized Educational Video(s) Outcome: Adequate for Discharge Goal: Individualized Newborn Educational Video(s) Outcome: Adequate for Discharge   Problem: Activity: Goal: Will verbalize the importance of balancing activity with adequate rest periods Outcome: Adequate for Discharge Goal: Ability to tolerate increased activity will improve Outcome: Adequate for Discharge   Problem: Coping: Goal: Ability to identify and utilize available resources and services will improve Outcome: Adequate for Discharge   Problem: Life Cycle: Goal: Chance of risk for complications during the postpartum period will decrease Outcome: Adequate for Discharge   Problem: Role Relationship: Goal: Ability to demonstrate positive interaction with newborn will improve Outcome: Adequate for Discharge   Problem: Skin Integrity: Goal: Demonstration of wound healing without infection will improve Outcome: Adequate for Discharge   Problem: Education: Goal: Knowledge of disease or condition will improve Outcome: Adequate for Discharge Goal: Knowledge of the prescribed therapeutic regimen will improve Outcome: Adequate for Discharge   Problem: Fluid Volume: Goal: Peripheral tissue perfusion will improve Outcome: Adequate for Discharge   Problem: Clinical Measurements: Goal: Complications related to disease process, condition or treatment will be avoided or minimized Outcome: Adequate for Discharge

## 2020-11-08 LAB — SURGICAL PATHOLOGY

## 2020-11-09 ENCOUNTER — Other Ambulatory Visit: Payer: Self-pay

## 2020-11-10 ENCOUNTER — Other Ambulatory Visit: Payer: Self-pay

## 2020-11-10 ENCOUNTER — Ambulatory Visit (INDEPENDENT_AMBULATORY_CARE_PROVIDER_SITE_OTHER): Payer: 59

## 2020-11-10 DIAGNOSIS — Z0130 Encounter for examination of blood pressure without abnormal findings: Secondary | ICD-10-CM

## 2020-11-10 NOTE — Progress Notes (Signed)
Patient was assessed and managed by nursing staff during this encounter. I have reviewed the chart and agree with the documentation and plan. I have also made any necessary editorial changes.  Catalina Antigua, MD 11/10/2020 11:47 AM

## 2020-11-10 NOTE — Progress Notes (Signed)
Subjective:  Andrea Hayes is a 30 y.o. female here for BP check s/p NSVD on 11/04/2020. Patient admits to not taking Nifedipine 60 mg daily.   Hypertension ROS: not taking medications regularly as instructed, patient does not perform home BP monitoring, no TIA's, no chest pain on exertion, no dyspnea on exertion and noting swelling of ankles.   Objective:  BP (!) 134/94   Pulse 93   LMP 02/14/2020   Appearance alert, well appearing, and in no distress. Edema in feet noted.  General exam BP noted need improvement.  Assessment:   Blood Pressure needs improvement.   Plan:  Patient to take BP meds daily as instructed. Keep upcoming postpartum visit

## 2020-11-14 ENCOUNTER — Telehealth (HOSPITAL_COMMUNITY): Payer: Self-pay

## 2020-11-14 ENCOUNTER — Ambulatory Visit: Payer: 59

## 2020-11-14 ENCOUNTER — Other Ambulatory Visit (HOSPITAL_COMMUNITY): Payer: Self-pay

## 2020-11-14 NOTE — Telephone Encounter (Signed)
Pharmacy Transitions of Care Follow-up Telephone Call  Date of discharge: 11/07/20 Discharge Diagnosis: Term Pregnancy with Eclampsia  How have you been since you were released from the hospital? Patient has been doing well, couldn't speak for too long since she was with her baby.   Medication changes made at discharge: 1. Start:  enoxaparin (LOVENOX)   furosemide (LASIX)   ibuprofen (ADVIL)   NIFEdipine (ADALAT CC)   potassium chloride SA (KLOR-CON)   Senexon-S    Medication changes verified by the patient? Verified Enoxaparin    Medication Accessibility:  Home Pharmacy: has a home pharmacy but no refills were sent for enoxaparin in TOC.    Medication Review: ENOXAPARIN - Patient started on 11/07/20 and confirmed 42 day therapy with patient. Went over signs and symptoms of bleeding to look out for while on enoxaparin (nosebleeds,bruising,blood in gums, blood in stool/urine).  Follow-up Appointments:  PCP Hospital f/u appt confirmed? No  Specialist Hospital f/u appt confirmed? Sees OBGYN on 12/05/20 after finishing course of enoxaparin.  If their condition worsens, is the pt aware to call PCP or go to the Emergency Dept.? yes  Final Patient Assessment: Patient doing well. Has follow ups after finishing course of blood thinner.

## 2020-11-28 ENCOUNTER — Other Ambulatory Visit: Payer: Self-pay

## 2020-11-28 ENCOUNTER — Inpatient Hospital Stay (HOSPITAL_COMMUNITY): Payer: 59

## 2020-11-28 ENCOUNTER — Encounter (HOSPITAL_COMMUNITY): Payer: Self-pay | Admitting: Obstetrics and Gynecology

## 2020-11-28 ENCOUNTER — Observation Stay (HOSPITAL_COMMUNITY)
Admission: AD | Admit: 2020-11-28 | Discharge: 2020-11-28 | Disposition: A | Discharge status: Home / Self Care | Payer: 59 | Attending: Internal Medicine | Admitting: Internal Medicine

## 2020-11-28 DIAGNOSIS — I10 Essential (primary) hypertension: Secondary | ICD-10-CM

## 2020-11-28 DIAGNOSIS — M79606 Pain in leg, unspecified: Secondary | ICD-10-CM

## 2020-11-28 DIAGNOSIS — R519 Headache, unspecified: Secondary | ICD-10-CM | POA: Diagnosis present

## 2020-11-28 DIAGNOSIS — G932 Benign intracranial hypertension: Secondary | ICD-10-CM | POA: Insufficient documentation

## 2020-11-28 DIAGNOSIS — O873 Cerebral venous thrombosis in the puerperium: Secondary | ICD-10-CM | POA: Diagnosis not present

## 2020-11-28 DIAGNOSIS — Z7901 Long term (current) use of anticoagulants: Secondary | ICD-10-CM | POA: Insufficient documentation

## 2020-11-28 DIAGNOSIS — R609 Edema, unspecified: Secondary | ICD-10-CM

## 2020-11-28 DIAGNOSIS — Z9889 Other specified postprocedural states: Secondary | ICD-10-CM

## 2020-11-28 DIAGNOSIS — I63511 Cerebral infarction due to unspecified occlusion or stenosis of right middle cerebral artery: Secondary | ICD-10-CM | POA: Diagnosis present

## 2020-11-28 DIAGNOSIS — O1043 Pre-existing secondary hypertension complicating the puerperium: Secondary | ICD-10-CM | POA: Diagnosis not present

## 2020-11-28 DIAGNOSIS — G08 Intracranial and intraspinal phlebitis and thrombophlebitis: Secondary | ICD-10-CM

## 2020-11-28 LAB — COMPREHENSIVE METABOLIC PANEL
ALT: 18 U/L (ref 0–44)
AST: 28 U/L (ref 15–41)
Albumin: 2.7 g/dL — ABNORMAL LOW (ref 3.5–5.0)
Alkaline Phosphatase: 79 U/L (ref 38–126)
Anion gap: 9 (ref 5–15)
BUN: 10 mg/dL (ref 6–20)
CO2: 26 mmol/L (ref 22–32)
Calcium: 8.9 mg/dL (ref 8.9–10.3)
Chloride: 102 mmol/L (ref 98–111)
Creatinine, Ser: 0.61 mg/dL (ref 0.44–1.00)
GFR, Estimated: >60 mL/min (ref >60)
Glucose, Bld: 93 mg/dL (ref 70–99)
Potassium: 3.7 mmol/L (ref 3.5–5.1)
Sodium: 137 mmol/L (ref 135–145)
Total Bilirubin: 0.6 mg/dL (ref 0.3–1.2)
Total Protein: 6.5 g/dL (ref 6.5–8.1)

## 2020-11-28 LAB — CBC WITH DIFFERENTIAL/PLATELET
Abs Immature Granulocytes: 0.02 10*3/uL (ref 0.00–0.07)
Basophils Absolute: 0.1 10*3/uL (ref 0.0–0.1)
Basophils Relative: 1 %
Eosinophils Absolute: 0.1 10*3/uL (ref 0.0–0.5)
Eosinophils Relative: 2 %
HCT: 37.9 % (ref 36.0–46.0)
Hemoglobin: 12.1 g/dL (ref 12.0–15.0)
Immature Granulocytes: 0 %
Lymphocytes Relative: 23 %
Lymphs Abs: 1.4 10*3/uL (ref 0.7–4.0)
MCH: 29 pg (ref 26.0–34.0)
MCHC: 31.9 g/dL (ref 30.0–36.0)
MCV: 90.9 fL (ref 80.0–100.0)
Monocytes Absolute: 0.7 10*3/uL (ref 0.1–1.0)
Monocytes Relative: 10 %
Neutro Abs: 4 10*3/uL (ref 1.7–7.7)
Neutrophils Relative %: 64 %
Platelets: 222 10*3/uL (ref 150–400)
RBC: 4.17 MIL/uL (ref 3.87–5.11)
RDW: 12.7 % (ref 11.5–15.5)
WBC: 6.3 10*3/uL (ref 4.0–10.5)
nRBC: 0 % (ref 0.0–0.2)

## 2020-11-28 LAB — T4, FREE: Free T4: 0.64 ng/dL (ref 0.61–1.12)

## 2020-11-28 LAB — TSH: TSH: 1.269 u[IU]/mL (ref 0.350–4.500)

## 2020-11-28 LAB — BRAIN NATRIURETIC PEPTIDE: B Natriuretic Peptide: 41.3 pg/mL (ref 0.0–100.0)

## 2020-11-28 MED ORDER — NIFEDIPINE ER OSMOTIC RELEASE 30 MG PO TB24: 60.0000 mg | ORAL_TABLET | Freq: Every day | ORAL | Status: DC

## 2020-11-28 MED ORDER — PRENATAL MULTIVITAMIN CH: 1.0000 | ORAL_TABLET | Freq: Every day | ORAL | Status: DC

## 2020-11-28 MED ORDER — AMOXICILLIN 500 MG PO CAPS: 500.0000 mg | ORAL_CAPSULE | Freq: Two times a day (BID) | ORAL | 0 refills | Status: AC

## 2020-11-28 MED ORDER — ACETAZOLAMIDE 125 MG PO TABS: 250.0000 mg | ORAL_TABLET | Freq: Two times a day (BID) | ORAL | 2 refills | Status: DC

## 2020-11-28 MED ORDER — FUROSEMIDE 20 MG PO TABS
20.0000 mg | ORAL_TABLET | Freq: Every day | ORAL | Status: DC
  Filled 2020-11-28: qty 1

## 2020-11-28 MED ORDER — SODIUM CHLORIDE 0.9 % IV SOLN
3.0000 g | Freq: Once | INTRAVENOUS | Status: DC
  Filled 2020-11-28: qty 8

## 2020-11-28 MED ORDER — LACTATED RINGERS IV SOLN: INTRAVENOUS | Status: DC

## 2020-11-28 MED ORDER — LIDOCAINE HCL (PF) 1 % IJ SOLN
INTRAMUSCULAR | Status: AC
  Filled 2020-11-28: qty 5

## 2020-11-28 MED ORDER — SODIUM CHLORIDE 0.9 % IV SOLN: 1.5000 g | Freq: Four times a day (QID) | INTRAVENOUS | Status: DC

## 2020-11-28 MED ORDER — LIDOCAINE HCL (PF) 1 % IJ SOLN
INTRAMUSCULAR | Status: AC
  Administered 2020-11-28: 5 mL via INTRADERMAL
  Filled 2020-11-28: qty 30

## 2020-11-28 MED ORDER — GADOBUTROL 1 MMOL/ML IV SOLN
7.5000 mL | Freq: Once | INTRAVENOUS | Status: AC | PRN
  Administered 2020-11-28: 7.5 mL via INTRAVENOUS

## 2020-11-28 MED ORDER — LIDOCAINE HCL (PF) 1 % IJ SOLN: 5.0000 mL | Freq: Once | INTRAMUSCULAR | Status: AC

## 2020-11-28 NOTE — H&P (Signed)
History and Physical    Andrea Hayes MRN:[id number] DOB: 08/12/1990 DOA: 11/28/2020  PCP: Avbuere, Edwin, MD    Patient coming from:  Home.   Chief Complaint:  Headache.   HPI: Andrea Hayes is a 30 y.o. 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  Patient reports headache that started about a week ago has been progressively worsening 10 out of 10, left side of the head and also left side of the face having swelling that she is noted.  Also reports swelling in both her legs.  No visual changes or blurred vision, no hearing loss but has a pressure sensation in both her ears feeling like they are full.  Patient was seen by neurologist today with LP attempted and is scheduled to have IR guided lumbar puncture tomorrow. Hypercoagulable work-up was unremarkable including homocystein 4.8, antithrombin III mildly elevated at 128, low protein S activity of 22, total protein S 109, protein C activity 180, total protein C 127, lupus anticoagulant 25.3, DRVVT 44.7, beta-2-glycoprotein antibodies <9, factor V leiden mutation negative, prothrombin gene mutation negative, cardiolipin antibodies negative, sickle cell screen negative    Pt has past medical history of IIH, carvernus sinus thrombosis. ED Course:  Vitals:   11/28/20 0838 11/28/20 0900 11/28/20 1030 11/28/20 1100  BP: (!) 127/92 130/88 128/90 128/89  Pulse: 92 88 94 88  Resp:      Temp:      TempSrc:      SpO2:      Weight:      Height:      In ed pt has negative doppler le, and Mri and MRV are stable per neuro note and plan is to restart diamox.CBC/CMP and BNP is normal.  Review of Systems:  Review of Systems  Constitutional: Negative.   HENT: Positive for congestion, ear pain and sinus pain.   Eyes: Positive for photophobia and pain. Negative for blurred vision, double vision,  discharge and redness.  Respiratory: Negative.   Cardiovascular: Positive for leg swelling. Negative for chest pain, palpitations, orthopnea, claudication and PND.  Gastrointestinal: Negative.   Genitourinary: Negative.   Musculoskeletal: Negative.   Skin: Negative.   Neurological: Positive for headaches. Negative for dizziness, tingling, tremors, sensory change, speech change, focal weakness, seizures, loss of consciousness and weakness.     Past Medical History:  Diagnosis Date  . Stroke (HCC)     Past Surgical History:  Procedure Laterality Date  . NO PAST SURGERIES       reports that she has never smoked. She has never used smokeless tobacco. She reports that she does not drink alcohol and does not use drugs.  No Known Allergies  Family History  Problem Relation Age of Onset  . Asthma Father   . Obesity Neg Hx   . Hypertension Neg Hx   . Diabetes Neg Hx   . Cancer Neg Hx     Prior to Admission medications   Medication Sig Start Date End Date Taking? Authorizing Provider  acetaZOLAMIDE (DIAMOX) 125 MG tablet Take 2 tablets (250 mg total) by mouth 2 (two) times daily. Can increase to 500 mg twice daily if symptoms persist after 2 weeks. 11/28/20  Yes Yadav, Priyanka O, MD  enoxaparin (LOVENOX) 40 MG/0.4ML injection Inject 0.4 mLs (40 mg total) into the skin daily. 11/07/20 12/19/20 Yes Anyanwu, Ugonna A, MD  ibuprofen (ADVIL) 600   MG tablet Take 1 tablet (600 mg total) by mouth every 6 (six) hours as needed for headache, mild pain, moderate pain or cramping. 11/07/20  Yes Anyanwu, Ugonna A, MD  NIFEdipine (ADALAT CC) 60 MG 24 hr tablet Take 1 tablet (60 mg total) by mouth daily. 11/07/20  Yes Anyanwu, Ugonna A, MD  Prenatal Vit-Fe Fumarate-FA (PRENATAL VITAMIN) 27-0.8 MG TABS Take 1 tablet by mouth daily. 04/19/20  Yes Williams, Marie L, CNM  senna-docusate (SENOKOT-S) 8.6-50 MG tablet Take 2 tablets by mouth at bedtime as needed for mild constipation or moderate constipation.  11/07/20  Yes Anyanwu, Ugonna A, MD  acetaminophen (TYLENOL) 325 MG tablet Take 2 tablets (650 mg total) by mouth every 6 (six) hours as needed for mild pain or headache (do not take over 3000 mg in a day of tylenol. please note there is tylenol in the fiorecet). Patient not taking: Reported on 11/10/2020 07/29/18   Biby, Sharon L, NP  Blood Pressure Monitoring (BLOOD PRESSURE KIT) DEVI 1 kit by Does not apply route as needed. Patient not taking: No sig reported 04/28/20   Bass, Lawrence A, MD  cyclobenzaprine (FLEXERIL) 5 MG tablet Take 1 tablet (5 mg total) by mouth 3 (three) times daily as needed for muscle spasms. Patient not taking: Reported on 11/10/2020 09/20/20   Lawrence, Erin, NP  furosemide (LASIX) 20 MG tablet Take 1 tablet (20 mg total) by mouth daily for 3 days. 11/07/20 11/10/20  Anyanwu, Ugonna A, MD  potassium chloride SA (KLOR-CON) 20 MEQ tablet Take 1 tablet (20 mEq total) by mouth 2 (two) times daily for 3 days. 11/07/20 11/10/20  Anyanwu, Ugonna A, MD    Physical Exam: Vitals:   11/28/20 0838 11/28/20 0900 11/28/20 1030 11/28/20 1100  BP: (!) 127/92 130/88 128/90 128/89  Pulse: 92 88 94 88  Resp:      Temp:      TempSrc:      SpO2:      Weight:      Height:       Physical Exam Vitals and nursing note reviewed.  Constitutional:      General: She is not in acute distress.    Appearance: She is well-developed. She is not ill-appearing, toxic-appearing or diaphoretic.  HENT:     Head: Normocephalic and atraumatic.     Salivary Glands: Left salivary gland is tender.     Right Ear: Hearing, tympanic membrane and external ear normal. No decreased hearing noted. No laceration, drainage, swelling or tenderness. No middle ear effusion. There is no impacted cerumen. No foreign body. No mastoid tenderness. No PE tube. No hemotympanum. Tympanic membrane is not injected, scarred, perforated, erythematous, retracted or bulging. Tympanic membrane has normal mobility.     Left Ear: Hearing  and external ear normal. No decreased hearing noted. No laceration, drainage, swelling or tenderness.  No middle ear effusion. There is impacted cerumen. No foreign body. No mastoid tenderness.     Nose: Nose normal. No nasal deformity, septal deviation, signs of injury, nasal tenderness, mucosal edema, congestion or rhinorrhea.     Right Nostril: No foreign body, epistaxis, septal hematoma or occlusion.     Left Nostril: No foreign body, epistaxis, septal hematoma or occlusion.     Mouth/Throat:     Mouth: Mucous membranes are moist.     Dentition: Normal dentition.     Pharynx: Oropharynx is clear.  Eyes:     Extraocular Movements: Extraocular movements intact.     Pupils: Pupils are   equal, round, and reactive to light.  Neck:     Vascular: No carotid bruit.  Cardiovascular:     Rate and Rhythm: Normal rate and regular rhythm.     Pulses: Normal pulses.     Heart sounds: Normal heart sounds.  Pulmonary:     Effort: Pulmonary effort is normal.     Breath sounds: Normal breath sounds.  Abdominal:     General: Bowel sounds are normal. There is no distension.     Palpations: Abdomen is soft.     Tenderness: There is no abdominal tenderness. There is no guarding.  Musculoskeletal:        General: No swelling.     Cervical back: Normal range of motion.     Right lower leg: Edema present.     Left lower leg: Edema present.  Neurological:     General: No focal deficit present.     Mental Status: She is alert and oriented to person, place, and time.     Gait: Gait normal.  Psychiatric:        Mood and Affect: Mood normal.        Behavior: Behavior normal.   Labs on Admission: I have personally reviewed following labs and imaging studies  No results for input(s): CKTOTAL, CKMB, TROPONINI in the last 72 hours. Lab Results  Component Value Date   WBC 6.3 11/28/2020   HGB 12.1 11/28/2020   HCT 37.9 11/28/2020   MCV 90.9 11/28/2020   PLT 222 11/28/2020    Recent Labs  Lab  11/28/20 0655  NA 137  K 3.7  CL 102  CO2 26  BUN 10  CREATININE 0.61  CALCIUM 8.9  PROT 6.5  BILITOT 0.6  ALKPHOS 79  ALT 18  AST 28  GLUCOSE 93   Lab Results  Component Value Date   CHOL 111 07/25/2018   HDL 50 07/25/2018   LDLCALC 51 07/25/2018   TRIG 50 07/25/2018   No results found for: DDIMER Invalid input(s): POCBNP COVID-19 Labs  Lab Results  Component Value Date   SARSCOV2NAA NEGATIVE 11/02/2020   SARSCOV2NAA NEGATIVE 06/23/2020    Radiological Exams on Admission: MR BRAIN W WO CONTRAST  Result Date: 11/28/2020 CLINICAL DATA:  Headaches. Recent pregnancy. Concern for dural venous sinus thrombosis. EXAM: MRI HEAD WITHOUT AND WITH CONTRAST MRV HEAD WITHOUT CONTRAST TECHNIQUE: Multiplanar, multiecho pulse sequences of the brain and surrounding structures were obtained without and with intravenous contrast. Angiographic images of the intracranial venous structures were obtained using MRV technique without intravenous contrast. CONTRAST:  7.5mL GADAVIST GADOBUTROL 1 MMOL/ML IV SOLN COMPARISON:  MRI August 12, 2020. FINDINGS: MRI: Brain: No acute infarct. No acute hemorrhage. No hydrocephalus. No mass lesion or abnormal mass effect. No extra-axial fluid collection. Normal position of cerebellar tonsils. Unremarkable sella. There are a few T2/FLAIR hyperintensities within the white matter. No abnormal enhancement. Vascular: Major arterial flow voids are maintained at the skull base. Dural venous sinuses appear patent on postcontrast imaging. Skull and upper cervical spine: No focal marrow replacement. Sinuses/orbits: Clear sinuses.  Unremarkable orbits. Other: No mastoid effusions. MRV: Both the MRV and postcontrast MRI are used to evaluate the dural venous sinuses. Similar filling defects in the posterior aspect of the superior sagittal sinus on MRV which appears nonocclusive on postcontrast imaging. The transverse and sigmoid sinuses appear patent. The dural venous sinuses  are nonexpanded and the visualized cortical and deep veins are patent. IMPRESSION: MRI: 1. No evidence of acute intracranial abnormality. 2.   A few T2/FLAIR hyperintensities within the white matter, mildly advanced for patient age. This finding is nonspecific but can be seen in the setting of chronic microvascular ischemia, a demyelinating process such as multiple sclerosis, chronic migraines, or as the sequelae of a prior infectious or inflammatory process. MRV: Similar filling defects within the posterior aspect of the superior sagittal sinus on MRV, which appear nonocclusive on postcontrast imaging. Electronically Signed   By: Frederick S Jones MD   On: 11/28/2020 13:00   MR MRV HEAD W WO CONTRAST  Result Date: 11/28/2020 CLINICAL DATA:  Headaches. Recent pregnancy. Concern for dural venous sinus thrombosis. EXAM: MRI HEAD WITHOUT AND WITH CONTRAST MRV HEAD WITHOUT CONTRAST TECHNIQUE: Multiplanar, multiecho pulse sequences of the brain and surrounding structures were obtained without and with intravenous contrast. Angiographic images of the intracranial venous structures were obtained using MRV technique without intravenous contrast. CONTRAST:  7.5mL GADAVIST GADOBUTROL 1 MMOL/ML IV SOLN COMPARISON:  MRI August 12, 2020. FINDINGS: MRI: Brain: No acute infarct. No acute hemorrhage. No hydrocephalus. No mass lesion or abnormal mass effect. No extra-axial fluid collection. Normal position of cerebellar tonsils. Unremarkable sella. There are a few T2/FLAIR hyperintensities within the white matter. No abnormal enhancement. Vascular: Major arterial flow voids are maintained at the skull base. Dural venous sinuses appear patent on postcontrast imaging. Skull and upper cervical spine: No focal marrow replacement. Sinuses/orbits: Clear sinuses.  Unremarkable orbits. Other: No mastoid effusions. MRV: Both the MRV and postcontrast MRI are used to evaluate the dural venous sinuses. Similar filling defects in the  posterior aspect of the superior sagittal sinus on MRV which appears nonocclusive on postcontrast imaging. The transverse and sigmoid sinuses appear patent. The dural venous sinuses are nonexpanded and the visualized cortical and deep veins are patent. IMPRESSION: MRI: 1. No evidence of acute intracranial abnormality. 2. A few T2/FLAIR hyperintensities within the white matter, mildly advanced for patient age. This finding is nonspecific but can be seen in the setting of chronic microvascular ischemia, a demyelinating process such as multiple sclerosis, chronic migraines, or as the sequelae of a prior infectious or inflammatory process. MRV: Similar filling defects within the posterior aspect of the superior sagittal sinus on MRV, which appear nonocclusive on postcontrast imaging. Electronically Signed   By: Frederick S Jones MD   On: 11/28/2020 13:00   VAS US LOWER EXTREMITY VENOUS (DVT)  Result Date: 11/28/2020  Lower Venous DVT Study Patient Name:  Kailee Laureano  Date of Exam:   11/28/2020 Medical Rec #: [id number]       Accession #:    [id number] Date of Birth: 01/18/1991       Patient Gender: F Patient Age:   29Y Exam Location:  [facility] Procedure:      VAS US LOWER EXTREMITY VENOUS (DVT) Referring Phys: [id number] NICOLE E NUGENT --------------------------------------------------------------------------------  Indications: Edema, Pain, and Vaginal delivery 11/04/2020.  Comparison Study: No prior study Performing Technologist: Michelle Simonetti MHA, RDMS, RVT, RDCS  Examination Guidelines: A complete evaluation includes B-mode imaging, spectral Doppler, color Doppler, and power Doppler as needed of all accessible portions of each vessel. Bilateral testing is considered an integral part of a complete examination. Limited examinations for reoccurring indications may be performed as noted. The reflux portion of the exam is performed with the patient in reverse Trendelenburg.   +---------+---------------+---------+-----------+----------+--------------+ RIGHT    CompressibilityPhasicitySpontaneityPropertiesThrombus Aging +---------+---------------+---------+-----------+----------+--------------+ CFV      Full           Yes        Yes                                 +---------+---------------+---------+-----------+----------+--------------+ SFJ      Full                                                        +---------+---------------+---------+-----------+----------+--------------+ FV Prox  Full                                                        +---------+---------------+---------+-----------+----------+--------------+ FV Mid   Full                                                        +---------+---------------+---------+-----------+----------+--------------+ FV DistalFull                                                        +---------+---------------+---------+-----------+----------+--------------+ PFV      Full                                                        +---------+---------------+---------+-----------+----------+--------------+ POP      Full           Yes      Yes                                 +---------+---------------+---------+-----------+----------+--------------+ PTV      Full                                                        +---------+---------------+---------+-----------+----------+--------------+ PERO     Full                                                        +---------+---------------+---------+-----------+----------+--------------+   +---------+---------------+---------+-----------+----------+--------------+ LEFT     CompressibilityPhasicitySpontaneityPropertiesThrombus Aging +---------+---------------+---------+-----------+----------+--------------+ CFV      Full           Yes      Yes                                  +---------+---------------+---------+-----------+----------+--------------+ SFJ      Full                                                        +---------+---------------+---------+-----------+----------+--------------+  FV Prox  Full                                                        +---------+---------------+---------+-----------+----------+--------------+ FV Mid   Full                                                        +---------+---------------+---------+-----------+----------+--------------+ FV DistalFull                                                        +---------+---------------+---------+-----------+----------+--------------+ PFV      Full                                                        +---------+---------------+---------+-----------+----------+--------------+ POP      Full           Yes      Yes                                 +---------+---------------+---------+-----------+----------+--------------+ PTV      Full                                                        +---------+---------------+---------+-----------+----------+--------------+ PERO     Full                                                        +---------+---------------+---------+-----------+----------+--------------+     Summary: BILATERAL: - No evidence of deep vein thrombosis seen in the lower extremities, bilaterally. -No evidence of popliteal cyst, bilaterally.   *See table(s) above for measurements and observations.    Preliminary     EKG: Independently reviewed.  None     Assessment/Plan Principal Problem:   Headache Active Problems:   Intracranial hypertension   Cerebral venous thrombosis   Acute right arterial ischemic stroke, middle cerebral artery (MCA) (HCC)   HTN (hypertension)  Headache: Most likely differential in this pt is her pseudotumour cerebri, we will get IR guided LP and start Pt back on diamox in am with neurology  following patient.  Prn tylenol for headaches.PRN Zofran for nausea. IV PPI. IV ABX for suspected Sinus infection as well.  ICH: Diamox to be started in am. LP in am by IR. PRN tylenol.  H/O cavernous venous thrombosis: Pt is off Lovenox for LP in AM. MRV negative so far.   H/O Rt MCA stroke: MRI negative. Will resume aspirin  in am once pt is post procedure.   HTN: Blood pressure 128/89, pulse 88, temperature 98.3 F (36.8 C), temperature source Oral, resp. rate 16, height 5' 5" (1.651 m), weight 73.6 kg, SpO2 100 %, unknown if currently breastfeeding. ? H/O htn pt had preeclampsia during her pregnancy and is on lasix and nifedipine and bp today is low normal and pt has not taken her meds. We will hold and monitor BP.  DVT prophylaxis:  Lovenox 40 mg  Code Status:  Full Code  Family Communication:  [phone] (Home Phone)    Disposition Plan:  Home   Consults called:  Neurology  Admission status: Inpatient.    Ekta V Patel MD [facility] [phone] How to contact the [facility] Attending or Consulting provider 7A - 7P or covering provider during after hours 7P -7A, for this patient.    1. Check the care team in [facility] and look for a) attending/consulting [facility] provider listed and b) the [facility] team listed 2. Log into [email or web address] and use [facility] universal password to access. If you do not have the password, please contact the hospital operator. 3. Locate the [facility] provider you are looking for under [facility] and page to a number that you can be directly reached. 4. If you still have difficulty reaching the provider, please page the DOC (Director on Call) for the Hospitalists listed on amion for assistance. [email or web address] Password [email or web address] 11/28/2020, 4:54 PM    

## 2020-11-28 NOTE — Consult Note (Addendum)
Neurology Consultation Reason for Consult: headache Referring Physician: Dr Darrick Grinder  CC: headache  History is obtained from: patient and chart review  HPI: Andrea Hayes is a 30 y.o. 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  Patient states she has not had headache for a long time started having headache about a week ago which is gradually been worsening. States that headache is predominantly on the left side of her head, unable to describe the character, states it is 10/10, associated with pressure-like feeling in her ears but no frank tenderness, denies any vision changes, tingling, numbness, denies any worsening with bending forward.  Patient does report swelling in her legs as well as pain on the left side of her face at times.  Denies any recent infection, change in medications.  Did not receive epidural for her delivery. States she took her Lovenox shot yesterday morning.   Per review of Dr. 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  She was started on Diamox.  However, continued to have worsening headache with nausea, vomiting and photophobia.  CT head and CTV head on 07/24/2018 showed acute venous sinus thrombosis from superior sagittal sinus to right sigmoid sinus and within intracerebral veins.  Hypercoagulable work-up was unremarkable including homocystein 4.8, antithrombin III mildly elevated at 128, low protein S activity of 22, total protein S 109, protein C activity 180, total protein C 127, lupus anticoagulant 25.3, DRVVT 44.7, beta-2-glycoprotein antibodies <9, factor V leiden mutation negative, prothrombin gene mutation negative, cardiolipin antibodies negative,  sickle cell screen negative and negative for pregnancy.  She was started on Lovenox.  Headache subsequently improved and patient self discontinued medications including warfarin, acetazolamide and topiramate.  She became pregnant in late 2021 with return of symptoms and was restarted on Lovenox.  ROS: All other systems reviewed and negative except as noted in the HPI.   Past Medical History:  Diagnosis Date  . Stroke [facility])     Family History  Problem Relation Age of Onset  . Asthma Father   . Obesity Neg Hx   . Hypertension Neg Hx   . Diabetes Neg Hx   . Cancer Neg Hx     Social History:  reports that she has never smoked. She has never used smokeless tobacco. She reports that she does not drink alcohol and does not use drugs.   Medications Prior to Admission  Medication Sig Dispense Refill Last Dose  . enoxaparin (LOVENOX) 40 MG/0.4ML injection Inject 0.4 mLs (40 mg total) into the skin daily. 16.8 mL 0 Past Week at Unknown time  . ibuprofen (ADVIL) 600 MG tablet Take 1 tablet (600 mg total) by mouth every 6 (six) hours as needed for headache, mild pain, moderate pain or cramping. 30 tablet 0 11/27/2020 at Unknown time  . NIFEdipine (ADALAT CC) 60 MG 24 hr tablet Take 1 tablet (60 mg total) by mouth daily. 30 tablet 0 11/27/2020 at 2330  . Prenatal Vit-Fe Fumarate-FA (PRENATAL VITAMIN) 27-0.8 MG TABS Take 1 tablet by mouth daily. 30 tablet 12 11/27/2020 at Unknown time  . senna-docusate (SENOKOT-S) 8.6-50 MG tablet Take 2 tablets by mouth at bedtime as needed for mild constipation or moderate constipation. 30 tablet 0 Past Month at  Unknown time  . acetaminophen (TYLENOL) 325 MG tablet Take 2 tablets (650 mg total) by mouth every 6 (six) hours as needed for mild pain or headache (do not take over 3000 mg in a day of tylenol. please note there is tylenol in the fiorecet). (Patient not taking: Reported on 11/10/2020)     . Blood Pressure Monitoring (BLOOD PRESSURE KIT) DEVI 1 kit by Does not  apply route as needed. (Patient not taking: No sig reported) 1 each 0   . cyclobenzaprine (FLEXERIL) 5 MG tablet Take 1 tablet (5 mg total) by mouth 3 (three) times daily as needed for muscle spasms. (Patient not taking: Reported on 11/10/2020) 20 tablet 0   . furosemide (LASIX) 20 MG tablet Take 1 tablet (20 mg total) by mouth daily for 3 days. 3 tablet 0   . potassium chloride SA (KLOR-CON) 20 MEQ tablet Take 1 tablet (20 mEq total) by mouth 2 (two) times daily for 3 days. 6 tablet 0       Exam: Current vital signs: BP 130/88   Pulse 88   Temp 98.3 F (36.8 C) (Oral)   Resp 16   Ht 5' 5" (1.651 m)   Wt 73.6 kg   SpO2 100% Comment: room air  BMI 26.99 kg/m  Vital signs in last 24 hours: Temp:  [98.3 F (36.8 C)] 98.3 F (36.8 C) (05/30 0826) Pulse Rate:  [88-98] 88 (05/30 0900) Resp:  [16-20] 16 (05/30 0826) BP: (102-130)/(76-94) 130/88 (05/30 0900) SpO2:  [99 %-100 %] 100 % (05/30 0826) Weight:  [73.6 kg-82.7 kg] 73.6 kg (05/30 0823)   Physical Exam  Constitutional: Appears well-developed and well-nourished.  Psych: Affect appropriate to situation Eyes: No scleral injection, unable to complete funduscopic exam as patient unable to cooperate HENT: No OP obstrucion Head: Normocephalic.  Cardiovascular: Normal rate and regular rhythm.  Respiratory: Effort normal, non-labored breathing GI: Soft.  No distension. There is no tenderness.  Skin: Warm Extremities: Nonpitting edema in bilateral lower extremities Neuro: AOx3, no evidence of aphasia, cranial nerves II through grossly intact, 5/5 in all extremities   I have reviewed labs in epic and the results pertinent to this consultation are: CBC:  Recent Labs  Lab 11/28/20 0655  WBC 6.3  NEUTROABS 4.0  HGB 12.1  HCT 37.9  MCV 90.9  PLT 916    Basic Metabolic Panel:  Lab Results  Component Value Date   NA 137 11/28/2020   K 3.7 11/28/2020   CO2 26 11/28/2020   GLUCOSE 93 11/28/2020   BUN 10 11/28/2020    CREATININE 0.61 11/28/2020   CALCIUM 8.9 11/28/2020   GFRNONAA >60 11/28/2020   GFRAA >60 01/27/2020   Lipid Panel:  Lab Results  Component Value Date   LDLCALC 51 07/25/2018   HgbA1c:  Lab Results  Component Value Date   HGBA1C 5.4 07/25/2018   Urine Drug Screen:     Component Value Date/Time   LABOPIA POSITIVE (A) 07/25/2018 1033   COCAINSCRNUR NONE DETECTED 07/25/2018 1033   LABBENZ NONE DETECTED 07/25/2018 1033   AMPHETMU NONE DETECTED 07/25/2018 1033   THCU NONE DETECTED 07/25/2018 1033   LABBARB NONE DETECTED 07/25/2018 1033    Alcohol Level No results found for: ETH  I have reviewed the images obtained:  MRV head without contrast on August 12, 2020 showed similar appearance of segmental thrombosis of the superior sagittal sinus and partially recanalized transverse and sigmoid sinuses.  MRI brain without contrast on February 05/2021 did not show  any acute abnormality.   ASSESSMENT/PLAN: 30 year old female with history of IIH, cerebral venous sinus thrombosis on Lovenox who presented with worsening headaches, facial pain and bilateral lower extremity edema.  Of note, patient recently gave birth to a baby girl on May 6.  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   Recommendations: -We will obtain MRI brain and MRV to assess for worsening of cerebral venous sinus thrombosis -If no acute worsening in renal sinus thrombosis, this could be secondary to worsening IH.  Consider obtaining lumbar puncture but patient has been on therapeutic Lovenox and therefore at high risk of bleeding   Addendum -Reviewed MRI brain and MRV, appears stable.  Patient had a diagnosis of IIH for a while prior to being diagnosed with venous sinus thrombosis -Discussed  about proceeding with lumbar puncture versus starting Diamox.  Patient states she has a baby at home and unfortunately no caregiver is available today.  Also she has a high BMI and therefore may require IR guided lumbar puncture -After discussion of risks and benefits with patient, we decided to start patient on acetazolamide 250 mg twice daily which can be increased to 500 mg twice daily in 2 weeks if symptoms persist.  I discussed the effects of Diamox and breast-feeding, told her to watch for excessive drowsiness, irritability, diarrhea, vomiting, etc. and the baby and to bring her to the hospital for any concerns -I also discussed with the patient to return to ED immediately for any worsening headache, vision changes, pulsatile tinnitus -Continue Lovenox and other medications -I discussed with patient that it can take a few weeks for Diamox to have significant effect on her headache.  In the interim, it is okay to take ibuprofen or Tylenol up to 3 times a day if needed for headache. -Follow-up with Dr. Tomi Likens at [facility] neurology in 2 weeks  Leg pain and swelling -ED team ordered lower extremity venous duplex.  Will defer further management to ED  ADDENDUM -Patient changed her mind and stated she would like to proceed with lumbar puncture.  Bedside lumbar puncture was attempted but was not successful. -Recommend admission to medicine team for IR guided lumbar puncture tomorrow.  Discussed with Dr. Posey Pronto (admitting hospitalist) to hold off any anticoagulation -Okay to resume his home medications.  Thank you for allowing [place] to participate in the care of this patient. If you have any further questions, please contact  me or neurohospitalist.   I have spent a total of 120 minutes with the patient reviewing hospital notes,  test results, labs and examining the patient as well as establishing an assessment and plan that was discussed personally with the patient and ED provider.  > 50% of time was spent  in direct patient care.   Zeb [facility] neurohospitalist

## 2020-11-28 NOTE — MAU Note (Signed)
Andrea Hayes is a 30 y.o. here in MAU reporting: SVD on 11/04/20, reporting increased swelling in her face and feet since last night. Also has a headache.  Onset of complaint: ongoing  Pain score: 10/10  Vitals:   11/28/20 0745 11/28/20 0826  BP: 122/89 (!) 128/92  Pulse: 93 88  Resp: 19 16  Temp:  98.3 F (36.8 C)  SpO2: 99% 100%     Lab orders placed from triage: none

## 2020-11-28 NOTE — ED Triage Notes (Signed)
The pt is c/o both legs red and swollen  Since may 6th when she delivered a baby.  For 3-4 days she has had face jaw and ear pain and no pain.  She is breast feeding her baby and she is afraid to take anything

## 2020-11-28 NOTE — ED Notes (Signed)
Pt being transported by tech to MAU at this time.

## 2020-11-28 NOTE — Discharge Summary (Signed)
Physician Discharge Summary  Andrea Hayes IPJ:[id number] DOB: 02-24-1991 DOA: 11/28/2020  PCP: [name], MD  Admit date: 11/28/2020 Discharge date: 11/28/2020   Discharge Diagnoses/Plan: Principal Problem:   Headache Active Problems:   Intracranial hypertension   Cerebral venous thrombosis   Acute right arterial ischemic stroke, middle cerebral artery (MCA) (HCC)   HTN (hypertension)   Discharge Condition:  Stable   Diet recommendation:  Heart healthy  Filed Weights   11/28/20 0616 11/28/20 0823  Weight: 82.7 kg 73.6 kg    Discharge activity: As tolerated with no lifting over 10-15 pounds.    History of present illness:  Pt admitted for headache and nausea and due to her h/o cavernous sinus thrombosis ,h/o cva and Pseudotumour cerebri pt was admitted for evaluation.   Hospital Course:  Pt refused to be stay for eval as she has no caretaker at home for hier newborn. Per neurology recommendation she si to resume diamox and Lovenox from tomorrow.  D/W pt about risk of leaving but she did not have anybody at home to  Are for her daughter.    Procedures: Attempted LP.  Consultations: Neurology.   Discharge Exam: Vitals:   11/28/20 1809 11/28/20 1813  BP: (!) 144/97 (!) 144/97  Pulse: 81 92  Resp: 18   Temp: 98.6 F (37 C)   SpO2: 100%     Physical Exam Vitals and nursing note reviewed.  Constitutional:      Appearance: She is well-developed.  HENT:     Head: Normocephalic and atraumatic.  Eyes:     Extraocular Movements: Extraocular movements intact.     Pupils: Pupils are equal, round, and reactive to light.  Cardiovascular:     Rate and Rhythm: Normal rate and regular rhythm.  Pulmonary:     Effort: Pulmonary effort is normal.     Breath sounds: Normal breath sounds.  Abdominal:     General: Bowel sounds are normal.     Palpations: Abdomen is soft.  Skin:    General: Skin is warm.  Neurological:     Mental Status: She is alert and  oriented to person, place, and time.     Deep Tendon Reflexes: Reflexes normal.  Psychiatric:        Mood and Affect: Mood normal.        Behavior: Behavior normal.    Physical Exam Vitals and nursing note reviewed.  Constitutional:      Appearance: She is well-developed.  HENT:     Head: Normocephalic and atraumatic.  Eyes:     Extraocular Movements: Extraocular movements intact.     Pupils: Pupils are equal, round, and reactive to light.  Cardiovascular:     Rate and Rhythm: Normal rate and regular rhythm.  Pulmonary:     Effort: Pulmonary effort is normal.     Breath sounds: Normal breath sounds.  Abdominal:     General: Bowel sounds are normal.     Palpations: Abdomen is soft.  Skin:    General: Skin is warm.  Neurological:     Mental Status: She is alert and oriented to person, place, and time.     Deep Tendon Reflexes: Reflexes normal.  Psychiatric:        Mood and Affect: Mood normal.        Behavior: Behavior normal.     Discharge Instructions  Discharge Instructions    Call MD for:  difficulty breathing, headache or visual disturbances   Complete by: As directed  Call MD for:  extreme fatigue   Complete by: As directed    Call MD for:  hives   Complete by: As directed    Call MD for:  persistant dizziness or light-headedness   Complete by: As directed    Call MD for:  persistant nausea and vomiting   Complete by: As directed    Call MD for:  severe uncontrolled pain   Complete by: As directed    Call MD for:  temperature >100.4   Complete by: As directed    Diet - low sodium heart healthy   Complete by: As directed    Discharge instructions   Complete by: As directed    Please follow up with PCP, and Neurologist . Please note your lasix and nifedipine have been stopped because your BP is low and it may make you dizzy.   Increase activity slowly   Complete by: As directed    Lifting restrictions   Complete by: As directed    Do not lift more    No wound care   Complete by: As directed      Allergies as of 11/28/2020   No Known Allergies     Medication List    STOP taking these medications   Blood Pressure Kit Devi   cyclobenzaprine 5 MG tablet Commonly known as: FLEXERIL   enoxaparin 40 MG/0.4ML injection Commonly known as: LOVENOX   furosemide 20 MG tablet Commonly known as: LASIX   ibuprofen 600 MG tablet Commonly known as: ADVIL   NIFEdipine 60 MG 24 hr tablet Commonly known as: ADALAT CC   potassium chloride SA 20 MEQ tablet Commonly known as: KLOR-CON   Senexon-S 8.6-50 MG tablet Generic drug: senna-docusate     TAKE these medications   acetaminophen 325 MG tablet Commonly known as: TYLENOL Take 2 tablets (650 mg total) by mouth every 6 (six) hours as needed for mild pain or headache (do not take over 3000 mg in a day of tylenol. please note there is tylenol in the fiorecet).   acetaZOLAMIDE 125 MG tablet Commonly known as: DIAMOX Take 2 tablets (250 mg total) by mouth 2 (two) times daily. Can increase to 500 mg twice daily if symptoms persist after 2 weeks.   amoxicillin 500 MG capsule Commonly known as: AMOXIL Take 1 capsule (500 mg total) by mouth 2 (two) times daily for 5 days.   Prenatal Vitamin 27-0.8 MG Tabs Take 1 tablet by mouth daily.      No Known Allergies    The results of significant diagnostics from this hospitalization (including imaging, microbiology, ancillary and laboratory) are listed below for reference.    Significant Diagnostic Studies: MR BRAIN W WO CONTRAST  Result Date: 11/28/2020 CLINICAL DATA:  Headaches. Recent pregnancy. Concern for dural venous sinus thrombosis. EXAM: MRI HEAD WITHOUT AND WITH CONTRAST MRV HEAD WITHOUT CONTRAST TECHNIQUE: Multiplanar, multiecho pulse sequences of the brain and surrounding structures were obtained without and with intravenous contrast. Angiographic images of the intracranial venous structures were obtained using MRV technique  without intravenous contrast. CONTRAST:  7.55mL GADAVIST GADOBUTROL 1 MMOL/ML IV SOLN COMPARISON:  MRI August 12, 2020. FINDINGS: MRI: Brain: No acute infarct. No acute hemorrhage. No hydrocephalus. No mass lesion or abnormal mass effect. No extra-axial fluid collection. Normal position of cerebellar tonsils. Unremarkable sella. There are a few T2/FLAIR hyperintensities within the white matter. No abnormal enhancement. Vascular: Major arterial flow voids are maintained at the skull base. Dural venous sinuses appear patent on  postcontrast imaging. Skull and upper cervical spine: No focal marrow replacement. Sinuses/orbits: Clear sinuses.  Unremarkable orbits. Other: No mastoid effusions. MRV: Both the MRV and postcontrast MRI are used to evaluate the dural venous sinuses. Similar filling defects in the posterior aspect of the superior sagittal sinus on MRV which appears nonocclusive on postcontrast imaging. The transverse and sigmoid sinuses appear patent. The dural venous sinuses are nonexpanded and the visualized cortical and deep veins are patent. IMPRESSION: MRI: 1. No evidence of acute intracranial abnormality. 2. A few T2/FLAIR hyperintensities within the white matter, mildly advanced for patient age. This finding is nonspecific but can be seen in the setting of chronic microvascular ischemia, a demyelinating process such as multiple sclerosis, chronic migraines, or as the sequelae of a prior infectious or inflammatory process. MRV: Similar filling defects within the posterior aspect of the superior sagittal sinus on MRV, which appear nonocclusive on postcontrast imaging. Electronically Signed   By: Margaretha Sheffield MD   On: 11/28/2020 13:00   [place] MFM FETAL BPP WO NON STRESS  Result Date: 11/05/2020 ----------------------------------------------------------------------  OBSTETRICS REPORT                      (Corrected Final 11/05/2020 10:53 am)  ---------------------------------------------------------------------- Patient Info  ID #:        [id number]                          D.O.B.:  10/31/1990 (29 yrs)  Name:        Andrea Hayes                  Visit Date: 11/02/2020 07:13 am ---------------------------------------------------------------------- Performed By  Attending:         Tama High MD        Ref. Address:      [address]                                                               [place]                                                               [place]  Performed By:      Germain Osgood RDMS       [facility]  Referred By:       [facility] ---------------------------------------------------------------------- Orders  #   Description                          Code         Ordered By  1   [place] MFM UA CORD DOPPLER               76820.02     RAVI SHANKAR  2   [place] MFM FETAL BPP WO NON              76819.01     RAVI [facility]      STRESS ----------------------------------------------------------------------  #   Order #                    Accession #                 Episode #  1   [id number]                  [id number]                  [id number]  2   [id number]                  [id number]                  [id number] ---------------------------------------------------------------------- Indications  [redacted] weeks gestation of pregnancy                 Z3A.36  Maternal care for suspected fetal abnormality   O35.9XX0  LOW risk NIPS, Negative Horizon, Negative AFP  Encounter for other antenatal screening         Z36.2  follow-up  Medical complication of pregnancy (Arterial      O26.90  ischemic stroke, cerebral venous thrombosis,  Lovenox) ---------------------------------------------------------------------- Fetal Evaluation  Num Of Fetuses:          1  Fetal Heart Rate(bpm):   138  Cardiac Activity:        Observed  Presentation:            Cephalic  Placenta:                Anterior  P. Cord Insertion:  Previously Visualized  Amniotic Fluid  AFI FV:      Within normal limits  AFI Sum(cm)     %Tile       Largest Pocket(cm)  11.8            37          7.84  RUQ(cm)       RLQ(cm)        LUQ(cm)        LLQ(cm)  7.84          3.27           0              0.69 ---------------------------------------------------------------------- Biophysical Evaluation  Amniotic F.V:   Pocket => 2 cm              F. Tone:         Observed  F. Movement:    Observed                    Score:           8/8  F. Breathing:   Observed ---------------------------------------------------------------------- Gestational Age  LMP:            37w 3d       Date:  02/14/20                   EDD:  11/20/20  Best:           36w 5d    Det. ByLoman Chroman           EDD:  11/25/20                                      (04/19/20) ---------------------------------------------------------------------- Anatomy  Cranium:                Previously seen        LVOT:                   Previously seen  Cavum:                  Previously seen        Aortic Arch:            Previously seen  Ventricles:             Previously seen        Ductal Arch:            Previously seen  Choroid Plexus:         Previously seen        Diaphragm:              Appears normal  Cerebellum:             Previously seen        Stomach:                Appears normal, left                                                                         sided  Posterior Fossa:        Previously seen        Abdomen:                Previously seen  Nuchal Fold:            Not applicable (>09    Abdominal Wall:         Previously seen                           wks GA)  Face:                   Orbits and profile     Cord Vessels:           Previously seen                          previously seen  Lips:                   Previously seen        Kidneys:                Appear normal  Palate:                 Previously seen        Bladder:                Appears normal  Thoracic:               Appears normal         Spine:                  Previously seen  Heart:                  Previously seen        Upper Extremities:      Previously seen  RVOT:                   Previously seen        Lower Extremities:      Previously seen  Other:   Female gender previously seen. Heels prev visualized. Open hands prev           visualized. Nasal bone prev visualized. Technically difficult due to fetal           position. ---------------------------------------------------------------------- Doppler - Fetal Vessels  Umbilical Artery    S/D     %tile      RI    %tile      PI    %tile            ADFV    RDFV   2.69        71   0.63        77   0.97        79               No      No ---------------------------------------------------------------------- Cervix Uterus Adnexa  Cervix  Not visualized (advanced GA >24wks) ---------------------------------------------------------------------- Impression  Severe fetal growth restriction.  Patient takes heparin  prophylaxis because of her history of stroke  Amniotic fluid is normal good fetal activity seen.  Cephalic  presentation.  Antenatal testing is reassuring.  BPP 8/8.  Umbilical artery Doppler showed normal forward diastolic flow.  Patient will be undergoing induction of labor on 11/04/2020 (  37  weeks). ----------------------------------------------------------------------                       Tama High, MD Electronically Signed Corrected Final Report  11/05/2020 10:53 am ----------------------------------------------------------------------  MR MRV HEAD W WO CONTRAST  Result Date: 11/28/2020 CLINICAL DATA:  Headaches. Recent  pregnancy. Concern for dural venous sinus thrombosis. EXAM: MRI HEAD WITHOUT AND WITH CONTRAST MRV HEAD WITHOUT CONTRAST TECHNIQUE: Multiplanar, multiecho pulse sequences of the brain and surrounding structures were obtained without and with intravenous contrast. Angiographic images of the intracranial venous structures were obtained using MRV technique without intravenous contrast. CONTRAST:  7.48mL GADAVIST GADOBUTROL 1 MMOL/ML IV SOLN COMPARISON:  MRI August 12, 2020. FINDINGS: MRI: Brain: No acute infarct. No acute hemorrhage. No hydrocephalus. No mass lesion or abnormal mass effect. No extra-axial fluid collection. Normal position of cerebellar tonsils. Unremarkable sella. There are a few T2/FLAIR hyperintensities within the white matter. No abnormal enhancement. Vascular: Major arterial flow voids are maintained at the skull base. Dural venous sinuses appear patent on postcontrast imaging. Skull and upper cervical spine: No focal marrow replacement. Sinuses/orbits: Clear sinuses.  Unremarkable orbits. Other: No mastoid effusions. MRV: Both the MRV and postcontrast MRI are used to evaluate the dural venous sinuses. Similar filling defects in the posterior aspect of the superior sagittal sinus on MRV which appears nonocclusive on postcontrast imaging. The transverse and sigmoid sinuses appear patent. The dural venous sinuses are nonexpanded and the visualized cortical and deep veins are patent. IMPRESSION: MRI: 1. No evidence of acute intracranial abnormality. 2. A few T2/FLAIR hyperintensities within the white matter, mildly advanced for patient age. This finding is nonspecific but can be seen in the setting of chronic microvascular ischemia, a demyelinating process such as multiple sclerosis, chronic migraines, or as the sequelae of a prior infectious or inflammatory process. MRV: Similar filling defects within the posterior aspect of the superior sagittal sinus on MRV, which appear nonocclusive on  postcontrast imaging. Electronically Signed   By: Margaretha Sheffield MD   On: 11/28/2020 13:00   VAS [place] LOWER EXTREMITY VENOUS (DVT)  Result Date: 11/28/2020  Lower Venous DVT Study Patient Name:  Andrea Hayes  Date of Exam:   11/28/2020 Medical Rec #: [id number]       Accession #:    [id number] Date of Birth: Sep 04, 1990       Patient Gender: F Patient Age:   29Y Exam Location:  [facility] Procedure:      VAS [place] LOWER EXTREMITY VENOUS (DVT) Referring Phys: [id number] NICOLE E NUGENT --------------------------------------------------------------------------------  Indications: Edema, Pain, and Vaginal delivery 11/04/2020.  Comparison Study: No prior study Performing Technologist: Maudry Mayhew MHA, RDMS, RVT, RDCS  Examination Guidelines: A complete evaluation includes B-mode imaging, spectral Doppler, color Doppler, and power Doppler as needed of all accessible portions of each vessel. Bilateral testing is considered an integral part of a complete examination. Limited examinations for reoccurring indications may be performed as noted. The reflux portion of the exam is performed with the patient in reverse Trendelenburg.  +---------+---------------+---------+-----------+----------+--------------+ RIGHT    CompressibilityPhasicitySpontaneityPropertiesThrombus Aging +---------+---------------+---------+-----------+----------+--------------+ CFV      Full           Yes      Yes                                 +---------+---------------+---------+-----------+----------+--------------+ SFJ      Full                                                        +---------+---------------+---------+-----------+----------+--------------+  FV Prox  Full                                                        +---------+---------------+---------+-----------+----------+--------------+ FV Mid   Full                                                         +---------+---------------+---------+-----------+----------+--------------+ FV DistalFull                                                        +---------+---------------+---------+-----------+----------+--------------+ PFV      Full                                                        +---------+---------------+---------+-----------+----------+--------------+ POP      Full           Yes      Yes                                 +---------+---------------+---------+-----------+----------+--------------+ PTV      Full                                                        +---------+---------------+---------+-----------+----------+--------------+ PERO     Full                                                        +---------+---------------+---------+-----------+----------+--------------+   +---------+---------------+---------+-----------+----------+--------------+ LEFT     CompressibilityPhasicitySpontaneityPropertiesThrombus Aging +---------+---------------+---------+-----------+----------+--------------+ CFV      Full           Yes      Yes                                 +---------+---------------+---------+-----------+----------+--------------+ SFJ      Full                                                        +---------+---------------+---------+-----------+----------+--------------+ FV Prox  Full                                                        +---------+---------------+---------+-----------+----------+--------------+  FV Mid   Full                                                        +---------+---------------+---------+-----------+----------+--------------+ FV DistalFull                                                        +---------+---------------+---------+-----------+----------+--------------+ PFV      Full                                                         +---------+---------------+---------+-----------+----------+--------------+ POP      Full           Yes      Yes                                 +---------+---------------+---------+-----------+----------+--------------+ PTV      Full                                                        +---------+---------------+---------+-----------+----------+--------------+ PERO     Full                                                        +---------+---------------+---------+-----------+----------+--------------+     Summary: BILATERAL: - No evidence of deep vein thrombosis seen in the lower extremities, bilaterally. -No evidence of popliteal cyst, bilaterally.   *See table(s) above for measurements and observations. Electronically signed by Monica Martinez MD on 11/28/2020 at 6:27:57 PM.    Final    [place] MFM UA CORD DOPPLER  Result Date: 11/05/2020 ----------------------------------------------------------------------  OBSTETRICS REPORT                      (Corrected Final 11/05/2020 10:53 am) ---------------------------------------------------------------------- Patient Info  ID #:        [id number]                          D.O.B.:  10/31/1990 (29 yrs)  Name:        Andrea Hayes                  Visit Date: 11/02/2020 07:13 am ---------------------------------------------------------------------- Performed By  Attending:         Tama High MD        Ref. Address:      [address]  [address]                                                               [place]                                                               [place]  Performed By:      Germain Osgood RDMS       [facility]          [facility]                                                               [facility]                                                                [facility]  Referred By:       [facility] ---------------------------------------------------------------------- Orders  #   Description                          Code         Ordered By  1   [place] MFM UA CORD DOPPLER               76820.02     RAVI SHANKAR  2   [place] MFM FETAL BPP WO NON              20355.97     RAVI [facility]      STRESS ----------------------------------------------------------------------  #   Order #                    Accession #                 Episode #  1   [id number]  [id number]                  [id number]  2   [id number]                  [id number]                  [id number] ---------------------------------------------------------------------- Indications  [redacted] weeks gestation of pregnancy                 Z3A.36  Maternal care for suspected fetal abnormality   O35.9XX0  LOW risk NIPS, Negative Horizon, Negative AFP  Encounter for other antenatal screening         Z36.2  follow-up  Medical complication of pregnancy (Arterial     O26.90  ischemic stroke, cerebral venous thrombosis,  Lovenox) ---------------------------------------------------------------------- Fetal Evaluation  Num Of Fetuses:          1  Fetal Heart Rate(bpm):   138  Cardiac Activity:        Observed  Presentation:            Cephalic  Placenta:                Anterior  P. Cord Insertion:       Previously Visualized  Amniotic Fluid  AFI FV:      Within normal limits  AFI Sum(cm)     %Tile       Largest Pocket(cm)  11.8            37          7.84  RUQ(cm)       RLQ(cm)        LUQ(cm)        LLQ(cm)  7.84          3.27           0              0.69 ---------------------------------------------------------------------- Biophysical Evaluation  Amniotic F.V:   Pocket => 2 cm              F. Tone:         Observed  F. Movement:    Observed                    Score:           8/8  F. Breathing:   Observed ----------------------------------------------------------------------  Gestational Age  LMP:            37w 3d       Date:  02/14/20                   EDD:  11/20/20  Best:           36w 5d    Det. ByLoman Chroman           EDD:  11/25/20                                      (04/19/20) ---------------------------------------------------------------------- Anatomy  Cranium:                Previously seen        LVOT:                   Previously seen  Cavum:  Previously seen        Aortic Arch:            Previously seen  Ventricles:             Previously seen        Ductal Arch:            Previously seen  Choroid Plexus:         Previously seen        Diaphragm:              Appears normal  Cerebellum:             Previously seen        Stomach:                Appears normal, left                                                                         sided  Posterior Fossa:        Previously seen        Abdomen:                Previously seen  Nuchal Fold:            Not applicable (>08    Abdominal Wall:         Previously seen                          wks GA)  Face:                   Orbits and profile     Cord Vessels:           Previously seen                          previously seen  Lips:                   Previously seen        Kidneys:                Appear normal  Palate:                 Previously seen        Bladder:                Appears normal  Thoracic:               Appears normal         Spine:                  Previously seen  Heart:                  Previously seen        Upper Extremities:      Previously seen  RVOT:                   Previously seen        Lower Extremities:      Previously seen  Other:   Female gender previously seen. Heels prev  visualized. Open hands prev           visualized. Nasal bone prev visualized. Technically difficult due to fetal           position. ---------------------------------------------------------------------- Doppler - Fetal Vessels  Umbilical Artery    S/D     %tile      RI    %tile      PI    %tile             ADFV    RDFV   2.69        71   0.63        77   0.97        79               No      No ---------------------------------------------------------------------- Cervix Uterus Adnexa  Cervix  Not visualized (advanced GA >24wks) ---------------------------------------------------------------------- Impression  Severe fetal growth restriction.  Patient takes heparin  prophylaxis because of her history of stroke  Amniotic fluid is normal good fetal activity seen.  Cephalic  presentation.  Antenatal testing is reassuring.  BPP 8/8.  Umbilical artery Doppler showed normal forward diastolic flow.  Patient will be undergoing induction of labor on 11/04/2020 (37  weeks). ----------------------------------------------------------------------                       Tama High, MD Electronically Signed Corrected Final Report  11/05/2020 10:53 am ----------------------------------------------------------------------   Microbiology: No results found for this or any previous visit (from the past 240 hour(s)).   Labs: Basic Metabolic Panel: Recent Labs  Lab 11/28/20 0655  NA 137  K 3.7  CL 102  CO2 26  GLUCOSE 93  BUN 10  CREATININE 0.61  CALCIUM 8.9   Liver Function Tests: Recent Labs  Lab 11/28/20 0655  AST 28  ALT 18  ALKPHOS 79  BILITOT 0.6  PROT 6.5  ALBUMIN 2.7*   No results for input(s): LIPASE, AMYLASE in the last 168 hours. No results for input(s): AMMONIA in the last 168 hours. CBC: Recent Labs  Lab 11/28/20 0655  WBC 6.3  NEUTROABS 4.0  HGB 12.1  HCT 37.9  MCV 90.9  PLT 222   Cardiac Enzymes: No results for input(s): CKTOTAL, CKMB, CKMBINDEX, TROPONINI in the last 168 hours. BNP: BNP (last 3 results) Recent Labs    11/28/20 0655  BNP 41.3    ProBNP (last 3 results) No results for input(s): PROBNP in the last 8760 hours.  CBG: No results for input(s): GLUCAP in the last 168 hours.  Time spent:15 minutes.  Signed:  Para Skeans MD.  [facility] 11/28/2020, 7:16 PM

## 2020-11-28 NOTE — Progress Notes (Signed)
Bilateral lower extremity venous duplex completed. Refer to "CV Proc" under chart review to view preliminary results.  11/28/2020 1:47 PM Eula Fried., MHA, RVT, RDCS, RDMS

## 2020-11-28 NOTE — [facility] Provider Note (Addendum)
History     CSN: [id number]  Arrival date and time: 11/28/20 0556   Event Date/Time   First Provider Initiated Contact with Patient 11/28/20 [phone]      Chief Complaint  Patient presents with  . Leg Swelling  . Headache   Andrea Hayes is a 30 y.o. G1P1001 at postpartum who presents to [facility]U for a headache and generalized facial pain. Patient delivered 11/04/2020 by vaginal delivery and had severe preeclampsia.  Patient reports her headache started one week ago. Patient reports the headache has been bad all week, but she did not come in because she had no one to take care of her baby. Patient reports she called her mom yesterday to come and watch the baby so she could come to the hospital to be seen. Patient reports she does have a history of migraine headaches and gets them frequently. Patient reports she took ibuprofen earlier in the week, which worked, but then the headache returned. Patient reports she only took one ibuprofen pill once time. Patient reports she took Tylenol last night, one pill, which took her headache away immediately after taking it, but then 30 minutes later, pt reports the pain returned. Patient reports headache is 10/10 at this time. Patient reports her headache is located at the back of her head. Patient reports the head pain is located on either side of her head, next to her ears. Patient also reports she feels pressure in her left eye and pain in her left cheek. These symptoms did not go away when she took her medication.  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 Patient also reports she had a blood clot in her brain this pregnancy. Patient reports she was taking Lovenox this pregnancy for the blood clot. Patient reports she is currently taking Lovenox and gives herself "one injection daily." Patient reports she had a severe headache when she was found to have a blood clot in her brain that is like today's headache, but  reports today's headache is worse.  Patient also reports swelling in her feet that started at the end of her pregnancy that will go away for about 30 minutes and then return. Patient reports when she eats salty or sugary things the swelling gets worse. Patient reports she does not know what makes it better. Patient reports the left foot is more swollen than her right foot, but both feet hurt occasionally, when walking or sitting. Patient was given Lasix for 3 days, but is unclear on how she took it reporting she took it "sometimes."  Pt denies VB, LOF, ctx, decreased FM, vaginal discharge/odor/itching. Pt denies N/V, abdominal pain, constipation, diarrhea, or urinary problems. Pt denies fever, chills, fatigue, sweating or changes in appetite. Pt denies SOB or chest pain. Pt denies dizziness, HA, light-headedness, weakness.  Problems this pregnancy include: severe preeclampsia. Allergies? NKDA Current medications/supplements? Lovenox, Nifedipine, ibuprofen, prenatal, Tylenol Pregnant/postpartum/breastfeeding? breastfeeding Prenatal care provider? [facility], next appt 12/05/2020   OB History    Gravida  1   Para  1   Term  1   Preterm      AB      Living  1     SAB      IAB      Ectopic      Multiple  0   Live Births  1           Past Medical History:  Diagnosis Date  . Stroke (  [facility])     Past Surgical History:  Procedure Laterality Date  . NO PAST SURGERIES      Family History  Problem Relation Age of Onset  . Asthma Father   . Obesity Neg Hx   . Hypertension Neg Hx   . Diabetes Neg Hx   . Cancer Neg Hx     Social History   Tobacco Use  . Smoking status: Never Smoker  . Smokeless tobacco: Never Used  Vaping Use  . Vaping Use: Never used  Substance Use Topics  . Alcohol use: No    Alcohol/week: 0.0 standard drinks  . Drug use: No    Allergies: No Known Allergies  Medications Prior to Admission  Medication Sig Dispense Refill Last Dose  .  enoxaparin (LOVENOX) 40 MG/0.4ML injection Inject 0.4 mLs (40 mg total) into the skin daily. 16.8 mL 0 Past Week at Unknown time  . ibuprofen (ADVIL) 600 MG tablet Take 1 tablet (600 mg total) by mouth every 6 (six) hours as needed for headache, mild pain, moderate pain or cramping. 30 tablet 0 11/27/2020 at Unknown time  . NIFEdipine (ADALAT CC) 60 MG 24 hr tablet Take 1 tablet (60 mg total) by mouth daily. 30 tablet 0 11/27/2020 at 2330  . Prenatal Vit-Fe Fumarate-FA (PRENATAL VITAMIN) 27-0.8 MG TABS Take 1 tablet by mouth daily. 30 tablet 12 11/27/2020 at Unknown time  . senna-docusate (SENOKOT-S) 8.6-50 MG tablet Take 2 tablets by mouth at bedtime as needed for mild constipation or moderate constipation. 30 tablet 0 Past Month at Unknown time  . acetaminophen (TYLENOL) 325 MG tablet Take 2 tablets (650 mg total) by mouth every 6 (six) hours as needed for mild pain or headache (do not take over 3000 mg in a day of tylenol. please note there is tylenol in the fiorecet). (Patient not taking: Reported on 11/10/2020)     . Blood Pressure Monitoring (BLOOD PRESSURE KIT) DEVI 1 kit by Does not apply route as needed. (Patient not taking: No sig reported) 1 each 0   . cyclobenzaprine (FLEXERIL) 5 MG tablet Take 1 tablet (5 mg total) by mouth 3 (three) times daily as needed for muscle spasms. (Patient not taking: Reported on 11/10/2020) 20 tablet 0   . furosemide (LASIX) 20 MG tablet Take 1 tablet (20 mg total) by mouth daily for 3 days. 3 tablet 0   . potassium chloride SA (KLOR-CON) 20 MEQ tablet Take 1 tablet (20 mEq total) by mouth 2 (two) times daily for 3 days. 6 tablet 0     Review of Systems  Constitutional: Negative for chills, diaphoresis, fatigue and fever.  HENT:       Pressure behind left eye, pain in left cheek  Eyes: Negative for visual disturbance.  Respiratory: Negative for shortness of breath.   Cardiovascular: Negative for chest pain.  Gastrointestinal: Negative for abdominal pain,  constipation, diarrhea, nausea and vomiting.  Genitourinary: Negative for dysuria, flank pain, frequency, pelvic pain, urgency, vaginal bleeding and vaginal discharge.  Musculoskeletal:       Bilateral foot swelling, worse in left with bilateral pain  Neurological: Positive for headaches. Negative for dizziness, weakness and light-headedness.   Physical Exam   Blood pressure 128/89, pulse 88, temperature 98.3 F (36.8 C), temperature source Oral, resp. rate 16, height $RemoveBe'5\' 5"'dsBZFSLRk$  (1.651 m), weight 73.6 kg, SpO2 100 %, unknown if currently breastfeeding.  Patient Vitals for the past 24 hrs:  BP Temp Temp src Pulse Resp SpO2 Height Weight  11/28/20  1100 128/89 -- -- 88 -- -- -- --  11/28/20 1030 128/90 -- -- 94 -- -- -- --  11/28/20 0900 130/88 -- -- 88 -- -- -- --  11/28/20 0838 (!) 127/92 -- -- 92 -- -- -- --  11/28/20 0826 (!) 128/92 98.3 F (36.8 C) Oral 88 16 100 % -- --  11/28/20 0823 -- -- -- -- -- -- $Rem'5\' 5"'JpDB$  (1.651 m) 73.6 kg  11/28/20 0745 122/89 -- -- 93 19 99 % -- --  11/28/20 0730 (!) 125/94 -- -- 92 20 99 % -- --  11/28/20 0715 125/89 -- -- 91 20 99 % -- --  11/28/20 0700 (!) 122/91 -- -- 92 17 99 % -- --  11/28/20 0616 -- -- -- -- -- -- $Rem'5\' 5"'qJuM$  (1.651 m) 82.7 kg  11/28/20 0602 102/76 -- -- 98 20 100 % -- --   Physical Exam Vitals and nursing note reviewed.  Constitutional:      General: She is not in acute distress.    Appearance: Normal appearance. She is not ill-appearing, toxic-appearing or diaphoretic.  HENT:     Head: Normocephalic and atraumatic.  Eyes:     Extraocular Movements: Extraocular movements intact.     Pupils: Pupils are equal, round, and reactive to light.  Pulmonary:     Effort: Pulmonary effort is normal.  Musculoskeletal:     Right lower leg: Tenderness present. No swelling.     Left lower leg: Tenderness present. No swelling.     Comments: Tenderness along calves bilaterally Non-pitting pedal edema bilaterally with left foot swollen more than right.   Skin:    General: Skin is warm and dry.  Neurological:     General: No focal deficit present.     Mental Status: She is alert and oriented to person, place, and time.     Cranial Nerves: Cranial nerves are intact.     Sensory: Sensation is intact.     Motor: No weakness.     Comments: CN III, IV, V, VI, VII, X, XI, XII assessed  Psychiatric:        Mood and Affect: Mood normal.        Behavior: Behavior normal.        Thought Content: Thought content normal.        Judgment: Judgment normal.    Results for orders placed or performed during the hospital encounter of 11/28/20 (from the past 24 hour(s))  CBC with Differential     Status: None   Collection Time: 11/28/20  6:55 AM  Result Value Ref Range   WBC 6.3 4.0 - 10.5 K/uL   RBC 4.17 3.87 - 5.11 MIL/uL   Hemoglobin 12.1 12.0 - 15.0 g/dL   HCT 37.9 36.0 - 46.0 %   MCV 90.9 80.0 - 100.0 fL   MCH 29.0 26.0 - 34.0 pg   MCHC 31.9 30.0 - 36.0 g/dL   RDW 12.7 11.5 - 15.5 %   Platelets 222 150 - 400 K/uL   nRBC 0.0 0.0 - 0.2 %   Neutrophils Relative % 64 %   Neutro Abs 4.0 1.7 - 7.7 K/uL   Lymphocytes Relative 23 %   Lymphs Abs 1.4 0.7 - 4.0 K/uL   Monocytes Relative 10 %   Monocytes Absolute 0.7 0.1 - 1.0 K/uL   Eosinophils Relative 2 %   Eosinophils Absolute 0.1 0.0 - 0.5 K/uL   Basophils Relative 1 %   Basophils Absolute 0.1 0.0 - 0.1 K/uL  Immature Granulocytes 0 %   Abs Immature Granulocytes 0.02 0.00 - 0.07 K/uL  Comprehensive metabolic panel     Status: Abnormal   Collection Time: 11/28/20  6:55 AM  Result Value Ref Range   Sodium 137 135 - 145 mmol/L   Potassium 3.7 3.5 - 5.1 mmol/L   Chloride 102 98 - 111 mmol/L   CO2 26 22 - 32 mmol/L   Glucose, Bld 93 70 - 99 mg/dL   BUN 10 6 - 20 mg/dL   Creatinine, Ser 0.61 0.44 - 1.00 mg/dL   Calcium 8.9 8.9 - 10.3 mg/dL   Total Protein 6.5 6.5 - 8.1 g/dL   Albumin 2.7 (L) 3.5 - 5.0 g/dL   AST 28 15 - 41 U/L   ALT 18 0 - 44 U/L   Alkaline Phosphatase 79 38 - 126 U/L    Total Bilirubin 0.6 0.3 - 1.2 mg/dL   GFR, Estimated >60 >60 mL/min   Anion gap 9 5 - 15  Brain natriuretic peptide     Status: None   Collection Time: 11/28/20  6:55 AM  Result Value Ref Range   B Natriuretic Peptide 41.3 0.0 - 100.0 pg/mL   MR BRAIN W WO CONTRAST  Result Date: 11/28/2020 CLINICAL DATA:  Headaches. Recent pregnancy. Concern for dural venous sinus thrombosis. EXAM: MRI HEAD WITHOUT AND WITH CONTRAST MRV HEAD WITHOUT CONTRAST TECHNIQUE: Multiplanar, multiecho pulse sequences of the brain and surrounding structures were obtained without and with intravenous contrast. Angiographic images of the intracranial venous structures were obtained using MRV technique without intravenous contrast. CONTRAST:  7.21mL GADAVIST GADOBUTROL 1 MMOL/ML IV SOLN COMPARISON:  MRI August 12, 2020. FINDINGS: MRI: Brain: No acute infarct. No acute hemorrhage. No hydrocephalus. No mass lesion or abnormal mass effect. No extra-axial fluid collection. Normal position of cerebellar tonsils. Unremarkable sella. There are a few T2/FLAIR hyperintensities within the white matter. No abnormal enhancement. Vascular: Major arterial flow voids are maintained at the skull base. Dural venous sinuses appear patent on postcontrast imaging. Skull and upper cervical spine: No focal marrow replacement. Sinuses/orbits: Clear sinuses.  Unremarkable orbits. Other: No mastoid effusions. MRV: Both the MRV and postcontrast MRI are used to evaluate the dural venous sinuses. Similar filling defects in the posterior aspect of the superior sagittal sinus on MRV which appears nonocclusive on postcontrast imaging. The transverse and sigmoid sinuses appear patent. The dural venous sinuses are nonexpanded and the visualized cortical and deep veins are patent. IMPRESSION: MRI: 1. No evidence of acute intracranial abnormality. 2. A few T2/FLAIR hyperintensities within the white matter, mildly advanced for patient age. This finding is nonspecific  but can be seen in the setting of chronic microvascular ischemia, a demyelinating process such as multiple sclerosis, chronic migraines, or as the sequelae of a prior infectious or inflammatory process. MRV: Similar filling defects within the posterior aspect of the superior sagittal sinus on MRV, which appear nonocclusive on postcontrast imaging. Electronically Signed   By: Margaretha Sheffield MD   On: 11/28/2020 13:00   [place] MFM FETAL BPP WO NON STRESS  Result Date: 11/05/2020 ----------------------------------------------------------------------  OBSTETRICS REPORT                      (Corrected Final 11/05/2020 10:53 am) ---------------------------------------------------------------------- Patient Info  ID #:        [id number]                          D.O.B.:  04/19/1991 (29 yrs)  Name:        Andrea Hayes                  Visit Date: 11/02/2020 07:13 am ---------------------------------------------------------------------- Performed By  Attending:         Noralee Space MD        Ref. Address:      [address]                                                               [address]                                                               [place]                                                               [address]  Performed By:      Reinaldo Raddle RDMS       [facility]  Referred By:       [facility] ---------------------------------------------------------------------- Orders  #   Description                          Code         Ordered By  1   [place] MFM UA CORD DOPPLER               76820.02     RAVI SHANKAR  2   [place] MFM FETAL BPP WO NON              76819.01     [facility]      STRESS  ----------------------------------------------------------------------  #   Order #                    Accession #                 Episode #  1   [id number]                  [id number]  2   [id number]                  [id number]                  [id number] ---------------------------------------------------------------------- Indications  [redacted] weeks gestation of pregnancy                 Z3A.36  Maternal care for suspected fetal abnormality   O35.9XX0  LOW risk NIPS, Negative Horizon, Negative AFP  Encounter for other antenatal screening         Z36.2  follow-up  Medical complication of pregnancy (Arterial     O26.90  ischemic stroke, cerebral venous thrombosis,  Lovenox) ---------------------------------------------------------------------- Fetal Evaluation  Num Of Fetuses:          1  Fetal Heart Rate(bpm):   138  Cardiac Activity:        Observed  Presentation:            Cephalic  Placenta:                Anterior  P. Cord Insertion:       Previously Visualized  Amniotic Fluid  AFI FV:      Within normal limits  AFI Sum(cm)     %Tile       Largest Pocket(cm)  11.8            37          7.84  RUQ(cm)       RLQ(cm)        LUQ(cm)        LLQ(cm)  7.84          3.27           0              0.69 ---------------------------------------------------------------------- Biophysical Evaluation  Amniotic F.V:   Pocket => 2 cm              F. Tone:         Observed  F. Movement:    Observed                    Score:           8/8  F. Breathing:   Observed ---------------------------------------------------------------------- Gestational Age  LMP:            37w 3d       Date:  02/14/20                   EDD:  11/20/20  Best:  36w 5d    Det. ByLoman Chroman           EDD:  11/25/20                                      (04/19/20) ---------------------------------------------------------------------- Anatomy  Cranium:                Previously seen        LVOT:                   Previously  seen  Cavum:                  Previously seen        Aortic Arch:            Previously seen  Ventricles:             Previously seen        Ductal Arch:            Previously seen  Choroid Plexus:         Previously seen        Diaphragm:              Appears normal  Cerebellum:             Previously seen        Stomach:                Appears normal, left                                                                         sided  Posterior Fossa:        Previously seen        Abdomen:                Previously seen  Nuchal Fold:            Not applicable (>56    Abdominal Wall:         Previously seen                          wks GA)  Face:                   Orbits and profile     Cord Vessels:           Previously seen                          previously seen  Lips:                   Previously seen        Kidneys:                Appear normal  Palate:                 Previously seen        Bladder:                Appears normal  Thoracic:               Appears normal         Spine:                  Previously seen  Heart:                  Previously seen        Upper Extremities:      Previously seen  RVOT:                   Previously seen        Lower Extremities:      Previously seen  Other:   Female gender previously seen. Heels prev visualized. Open hands prev           visualized. Nasal bone prev visualized. Technically difficult due to fetal           position. ---------------------------------------------------------------------- Doppler - Fetal Vessels  Umbilical Artery    S/D     %tile      RI    %tile      PI    %tile            ADFV    RDFV   2.69        71   0.63        77   0.97        79               No      No ---------------------------------------------------------------------- Cervix Uterus Adnexa  Cervix  Not visualized (advanced GA >24wks) ---------------------------------------------------------------------- Impression  Severe fetal growth restriction.  Patient takes heparin   prophylaxis because of her history of stroke  Amniotic fluid is normal good fetal activity seen.  Cephalic  presentation.  Antenatal testing is reassuring.  BPP 8/8.  Umbilical artery Doppler showed normal forward diastolic flow.  Patient will be undergoing induction of labor on 11/04/2020 (37  weeks). ----------------------------------------------------------------------                       Tama High, MD Electronically Signed Corrected Final Report  11/05/2020 10:53 am ----------------------------------------------------------------------  MR MRV HEAD W WO CONTRAST  Result Date: 11/28/2020 CLINICAL DATA:  Headaches. Recent pregnancy. Concern for dural venous sinus thrombosis. EXAM: MRI HEAD WITHOUT AND WITH CONTRAST MRV HEAD WITHOUT CONTRAST TECHNIQUE: Multiplanar, multiecho pulse sequences of the brain and surrounding structures were obtained without and with intravenous contrast. Angiographic images of the intracranial venous structures were obtained using MRV technique without intravenous contrast. CONTRAST:  7.37mL GADAVIST GADOBUTROL 1 MMOL/ML IV SOLN COMPARISON:  MRI August 12, 2020. FINDINGS: MRI: Brain: No acute infarct. No acute hemorrhage. No hydrocephalus. No mass lesion or abnormal mass effect. No extra-axial fluid collection. Normal position of cerebellar tonsils. Unremarkable sella. There are a few T2/FLAIR hyperintensities within the white matter. No abnormal enhancement. Vascular: Major arterial flow voids are maintained at the skull base. Dural venous sinuses appear patent on postcontrast imaging. Skull and upper cervical spine: No focal marrow replacement. Sinuses/orbits: Clear sinuses.  Unremarkable orbits. Other: No mastoid effusions. MRV: Both the MRV and postcontrast MRI are used to evaluate the dural venous sinuses. Similar filling defects in the posterior aspect of the superior sagittal sinus on MRV which appears nonocclusive on postcontrast imaging. The transverse and sigmoid  sinuses appear patent. The dural venous sinuses are nonexpanded and the visualized cortical and deep  veins are patent. IMPRESSION: MRI: 1. No evidence of acute intracranial abnormality. 2. A few T2/FLAIR hyperintensities within the white matter, mildly advanced for patient age. This finding is nonspecific but can be seen in the setting of chronic microvascular ischemia, a demyelinating process such as multiple sclerosis, chronic migraines, or as the sequelae of a prior infectious or inflammatory process. MRV: Similar filling defects within the posterior aspect of the superior sagittal sinus on MRV, which appear nonocclusive on postcontrast imaging. Electronically Signed   By: Margaretha Sheffield MD   On: 11/28/2020 13:00   VAS [place] LOWER EXTREMITY VENOUS (DVT)  Result Date: 11/28/2020  Lower Venous DVT Study Patient Name:  Andrea Hayes  Date of Exam:   11/28/2020 Medical Rec #: [id number]       Accession #:    [id number] Date of Birth: [birth year]       Patient Gender: F Patient Age:   29Y Exam Location:  [facility] Procedure:      VAS [place] LOWER EXTREMITY VENOUS (DVT) Referring Phys: [id number] Zackarie Chason E Jahara Dail --------------------------------------------------------------------------------  Indications: Edema, Pain, and Vaginal delivery 11/04/2020.  Comparison Study: No prior study Performing Technologist: Maudry Mayhew MHA, RDMS, RVT, RDCS  Examination Guidelines: A complete evaluation includes B-mode imaging, spectral Doppler, color Doppler, and power Doppler as needed of all accessible portions of each vessel. Bilateral testing is considered an integral part of a complete examination. Limited examinations for reoccurring indications may be performed as noted. The reflux portion of the exam is performed with the patient in reverse Trendelenburg.  +---------+---------------+---------+-----------+----------+--------------+ RIGHT    CompressibilityPhasicitySpontaneityPropertiesThrombus Aging  +---------+---------------+---------+-----------+----------+--------------+ CFV      Full           Yes      Yes                                 +---------+---------------+---------+-----------+----------+--------------+ SFJ      Full                                                        +---------+---------------+---------+-----------+----------+--------------+ FV Prox  Full                                                        +---------+---------------+---------+-----------+----------+--------------+ FV Mid   Full                                                        +---------+---------------+---------+-----------+----------+--------------+ FV DistalFull                                                        +---------+---------------+---------+-----------+----------+--------------+ PFV      Full                                                        +---------+---------------+---------+-----------+----------+--------------+  POP      Full           Yes      Yes                                 +---------+---------------+---------+-----------+----------+--------------+ PTV      Full                                                        +---------+---------------+---------+-----------+----------+--------------+ PERO     Full                                                        +---------+---------------+---------+-----------+----------+--------------+   +---------+---------------+---------+-----------+----------+--------------+ LEFT     CompressibilityPhasicitySpontaneityPropertiesThrombus Aging +---------+---------------+---------+-----------+----------+--------------+ CFV      Full           Yes      Yes                                 +---------+---------------+---------+-----------+----------+--------------+ SFJ      Full                                                         +---------+---------------+---------+-----------+----------+--------------+ FV Prox  Full                                                        +---------+---------------+---------+-----------+----------+--------------+ FV Mid   Full                                                        +---------+---------------+---------+-----------+----------+--------------+ FV DistalFull                                                        +---------+---------------+---------+-----------+----------+--------------+ PFV      Full                                                        +---------+---------------+---------+-----------+----------+--------------+ POP      Full           Yes      Yes                                 +---------+---------------+---------+-----------+----------+--------------+  PTV      Full                                                        +---------+---------------+---------+-----------+----------+--------------+ PERO     Full                                                        +---------+---------------+---------+-----------+----------+--------------+     Summary: BILATERAL: - No evidence of deep vein thrombosis seen in the lower extremities, bilaterally. -No evidence of popliteal cyst, bilaterally.   *See table(s) above for measurements and observations.    Preliminary    [place] MFM UA CORD DOPPLER  Result Date: 11/05/2020 ----------------------------------------------------------------------  OBSTETRICS REPORT                      (Corrected Final 11/05/2020 10:53 am) ---------------------------------------------------------------------- Patient Info  ID #:        [id number]                          D.O.B.:  1991-01-29 (29 yrs)  Name:        Andrea Hayes                  Visit Date: 11/02/2020 07:13 am ---------------------------------------------------------------------- Performed By  Attending:         Tama High MD        Ref. Address:       [address] [place]                                                               [place]  Performed By:      Germain Osgood RDMS       Location:          [facility]                                                               [place] [facility]                                                               [facility]  Referred By:       Maynard ---------------------------------------------------------------------- Orders  #   Description                          Code         Ordered By  1   [place] MFM UA CORD DOPPLER               76820.02     RAVI SHANKAR  2   [place] MFM FETAL BPP WO NON              76819.01     RAVI [facility]      STRESS ----------------------------------------------------------------------  #   Order #                    Accession #                 Episode #  1   [id number]                  [id number]  2   [id number]                  [id number]                  [id number] ---------------------------------------------------------------------- Indications  [redacted] weeks gestation of pregnancy                 Z3A.36  Maternal care for suspected fetal abnormality   O35.9XX0  LOW risk NIPS, Negative Horizon, Negative AFP  Encounter for other antenatal screening         Z36.2  follow-up  Medical complication of pregnancy (Arterial     O26.90  ischemic stroke, cerebral venous thrombosis,  Lovenox) ---------------------------------------------------------------------- Fetal Evaluation  Num Of Fetuses:          1  Fetal Heart Rate(bpm):   138  Cardiac Activity:        Observed  Presentation:            Cephalic  Placenta:                Anterior  P. Cord Insertion:       Previously Visualized  Amniotic Fluid  AFI FV:       Within normal limits  AFI Sum(cm)     %Tile       Largest Pocket(cm)  11.8            37          7.84  RUQ(cm)       RLQ(cm)  LUQ(cm)        LLQ(cm)  7.84          3.27           0              0.69 ---------------------------------------------------------------------- Biophysical Evaluation  Amniotic F.V:   Pocket => 2 cm              F. Tone:         Observed  F. Movement:    Observed                    Score:           8/8  F. Breathing:   Observed ---------------------------------------------------------------------- Gestational Age  LMP:            37w 3d       Date:  02/14/20                   EDD:  11/20/20  Best:           36w 5d    Det. ByLoman Chroman           EDD:  11/25/20                                      (04/19/20) ---------------------------------------------------------------------- Anatomy  Cranium:                Previously seen        LVOT:                   Previously seen  Cavum:                  Previously seen        Aortic Arch:            Previously seen  Ventricles:             Previously seen        Ductal Arch:            Previously seen  Choroid Plexus:         Previously seen        Diaphragm:              Appears normal  Cerebellum:             Previously seen        Stomach:                Appears normal, left                                                                         sided  Posterior Fossa:        Previously seen        Abdomen:                Previously seen  Nuchal Fold:            Not applicable (>25    Abdominal Wall:         Previously seen  wks GA)  Face:                   Orbits and profile     Cord Vessels:           Previously seen                          previously seen  Lips:                   Previously seen        Kidneys:                Appear normal  Palate:                 Previously seen        Bladder:                Appears normal  Thoracic:               Appears normal         Spine:                   Previously seen  Heart:                  Previously seen        Upper Extremities:      Previously seen  RVOT:                   Previously seen        Lower Extremities:      Previously seen  Other:   Female gender previously seen. Heels prev visualized. Open hands prev           visualized. Nasal bone prev visualized. Technically difficult due to fetal           position. ---------------------------------------------------------------------- Doppler - Fetal Vessels  Umbilical Artery    S/D     %tile      RI    %tile      PI    %tile            ADFV    RDFV   2.69        71   0.63        77   0.97        79               No      No ---------------------------------------------------------------------- Cervix Uterus Adnexa  Cervix  Not visualized (advanced GA >24wks) ---------------------------------------------------------------------- Impression  Severe fetal growth restriction.  Patient takes heparin  prophylaxis because of her history of stroke  Amniotic fluid is normal good fetal activity seen.  Cephalic  presentation.  Antenatal testing is reassuring.  BPP 8/8.  Umbilical artery Doppler showed normal forward diastolic flow.  Patient will be undergoing induction of labor on 11/04/2020 (37  weeks). ----------------------------------------------------------------------                       Tama High, MD Electronically Signed Corrected Final Report  11/05/2020 10:53 am ----------------------------------------------------------------------  MAU Course  Procedures  MDM -occipital HA in setting of multiple cerebral venous thromboses, including one this pregnancy; actively followed by neurology -HA unrelieved by inadequate dosing of ibuprofen and Tylenol at home -rates HA 10/10 -consulted with neurology (Dr. Curly Shores), per neurology pt is outside the window for treatment as pain  started almost one week ago, no need for code stroke at this time unless a significant change in neurological status. Neuro  provider will come to bedside to evaluate patient -neuro to bedside approximately 1020AM to evaluate patient, per RN, neurology will wait for results of MRI before treating -per Dr. Hortense Ramal from neurology, pt has Idiopathic Intracranial Hypertension and a lumbar puncture was attempted at the bedside, however it was unsuccessful, so the patient needs to be admitted for lumbar puncture with IR tomorrow as it is a holiday and they are not currently working -consulted with Dr. [name] who reports this patient should not be admitted through [facility] service, but should be admitted through medicine service -spoke with Dr. Florina Ou who agrees with plan for admission and will enter admission orders and come see the patient and then transfer to medicine service  -pt also with bilateral pedal and lower limb edema with tenderness on palpation of calves bilaterally and equally -unclear if pt took Lasix as previously prescribed -Vascular US Bilateral: no evidence of DVT  -elevated BP 120s/90s, interspersed with normal BP -pt on Nifedipine $RemoveBefor'30mg'llJeADZwszrT$  XL daily, but reports did not take it today -CBC/CMP WNL  -admit to medicine service  Orders Placed This Encounter  Procedures  . MR BRAIN W WO CONTRAST    Standing Status:   Standing    Number of Occurrences:   1    Order Specific Question:   If indicated for the ordered procedure, I authorize the administration of contrast media per Radiology protocol    Answer:   Yes    Order Specific Question:   What is the patient's sedation requirement?    Answer:   No Sedation    Order Specific Question:   Does the patient have a pacemaker or implanted devices?    Answer:   No  . MR MRV HEAD W WO CONTRAST    Standing Status:   Standing    Number of Occurrences:   1    Order Specific Question:   If indicated for the ordered procedure, I authorize the administration of contrast media per Radiology protocol    Answer:   Yes    Order Specific Question:   What is the patient's  sedation requirement?    Answer:   No Sedation    Order Specific Question:   Does the patient have a pacemaker or implanted devices?    Answer:   No  . CBC with Differential    Standing Status:   Standing    Number of Occurrences:   1  . Comprehensive metabolic panel    Standing Status:   Standing    Number of Occurrences:   1  . Brain natriuretic peptide    Standing Status:   Standing    Number of Occurrences:   1  . ED EKG    Leg swelling s/p pregnancy    Standing Status:   Standing    Number of Occurrences:   1    Order Specific Question:   Reason for Exam    Answer:   Other (See Comments)  . EKG 12-Lead    Standing Status:   Standing    Number of Occurrences:   1   Meds ordered this encounter  Medications  . gadobutrol (GADAVIST) 1 MMOL/ML injection 7.5 mL  . acetaZOLAMIDE (DIAMOX) 125 MG tablet    Sig: Take 2 tablets (250 mg total) by mouth 2 (two) times daily. Can increase to 500 mg twice daily  if symptoms persist after 2 weeks.    Dispense:  90 tablet    Refill:  2  . lidocaine (PF) (XYLOCAINE) 1 % injection 5 mL  . lidocaine (PF) (XYLOCAINE) 1 % injection    Price, Heather   : cabinet override  . lidocaine (PF) (XYLOCAINE) 1 % injection    Price, Heather   : cabinet override  . DISCONTD: lactated ringers infusion   Assessment and Plan   1. Idiopathic intracranial hypertension    -admit to medicine service -should patient need to return for further headache management, she needs to be seen in the Emergency Department, not in MAU  [place] 11/28/2020, 3:29 PM

## 2020-11-28 NOTE — ED Provider Notes (Signed)
Emergency Medicine Provider OB Triage Evaluation Note  Andrea Hayes is a 30 y.o. female, G1P1001, at [redacted]w[redacted]d gestation who presents to the emergency department with complaints of leg swelling, HA, and facial pain.  Pain and swelling worsened over the past week.  She gave birth on May 6, has not yet had her postop appointment.  She continues to take blood pressure medication and anticoagulation as prescribed.  She is not had her blood pressure medicine today.  She denies chest pain or shortness of breath.  No fevers, chills, cough.  Her pregnancy was complicated by severe preeclampsia, no other complications.  She is breast-feeding.  She took 1 ibuprofen without improvement of symptoms, has not taken anything else  Review of  Systems  Positive: HA, leg swelling, facial pain Negative: fever, cp sob  Physical Exam  BP 102/76 (BP Location: Right Arm)   Pulse 98   Resp 20   Ht 5\' 5"  (1.651 m)   Wt 82.7 kg   LMP 02/14/2020   SpO2 100%   BMI 30.34 kg/m  General: Awake, no distress  HEENT: Atraumatic.  Diffuse tenderness palpation of both cheeks and ears.  No AOM.  No obvious dental infection. Resp: Normal effort  Cardiac: Normal rate Abd: Nondistended, nontender  MSK: Moves all extremities without difficulty.  1-2+ pitting edema bilaterally. Neuro: Speech clear  Medical Decision Making  Pt evaluated for pregnancy concern and is stable for transfer to MAU. Pt is in agreement with plan for transfer.  7:04 AM Discussed with MAU APP, who accepts patient in transfer.  Clinical Impression  No diagnosis found.     02/16/2020, PA-C 11/28/20 11/30/20    5400, MD 11/29/20 1445

## 2020-11-28 NOTE — Care Plan (Signed)
Notified by RN that patient wont be able to stay because her infant cant stay with her at the hospital. Om to dc from neuro standpoint.   -After discussion of risks and benefits with patient, we decided to start patient on acetazolamide 250 mg twice daily which can be increased to 500 mg twice daily in 2 weeks if symptoms persist.  I discussed the effects of Diamox and breast-feeding, told her to watch for excessive drowsiness, irritability, diarrhea, vomiting, etc. and the baby and to bring her to the hospital for any concerns -I also discussed with the patient to return to ED immediately for any worsening headache, vision changes, pulsatile tinnitus - Resume Lovenox tomorrow ( hold today as she had LP)   -I discussed with patient that it can take a few weeks for Diamox to have significant effect on her headache.  In the interim, it is okay to take ibuprofen or Tylenol up to 3 times a day if needed for headache. -Follow-up with Dr. Everlena Cooper at [facility] neurology in 2 weeks  Analycia Khokhar Annabelle Harman

## 2020-11-28 NOTE — Progress Notes (Signed)
Patiet request infant to stay with her after transport to her new room. Advised patient that infant could not stay overnight. Patient states she can not stay in the hospital over night if her infant can not stay with her. Spoke to house coverage and confirmed that infant could not stay overnight on the other side of the hospital. Returned and informed patient and she request that her doctor be notified that she would not be staying without her infant due to childcare issues. Dr. Allena Katz notified of patients refusal of admission without infant. Dr. Allena Katz to come and speak with patient and educate importance of admission at this time.

## 2020-11-28 NOTE — Procedures (Signed)
LUMBAR PUNCTURE (SPINAL TAP) PROCEDURE NOTE  Indication: increased intracranial hypertension, headache   Proceduralists: Dr Lindie Spruce  Time of procedure: 11/28/2020 1500   Risks of the procedure were dicussed with the patient including post-LP headache, bleeding, infection, weakness/numbness of legs(radiculopathy), death.    Consent obtained from: patient or relative    Procedure Note The patient was prepped and draped, and using sterile technique a 20 gauge quinke spinal needle was inserted in the L4-5 space.   Procedure was  unsuccessful. Plan to do under IR guidance.  Patient tolerated the procedure well and blood loss was minimal.   Andrea Hayes

## 2020-11-29 ENCOUNTER — Telehealth: Payer: Self-pay

## 2020-11-29 LAB — HEMOGLOBIN A1C
Hgb A1c MFr Bld: 5.5 % (ref 4.8–5.6)
Mean Plasma Glucose: 111 mg/dL

## 2020-11-29 NOTE — Telephone Encounter (Signed)
Transition Care Management Follow-up Telephone Call    Date discharged? 11/28/2020  How have you been since you were released from the hospital? Pt stated that she feels ok she does not need anything she will call if she needs anything   Any patient concerns? no Concerns   Items Reviewed:  Medications reviewed: yes she understands the changes   Allergies reviewed: yes  Dietary changes reviewed: no change   Referrals reviewed: yes   Functional Questionnaire:  Independent - I Dependent - D    Activities of Daily Living (ADLs):    Personal hygiene - I Dressing - I Eating - I Maintaining continence - I Transferring - I  Independent Activities of Daily Living (iADLs): Basic communication skills - I Transportation - I Meal preparation  - I Shopping - I Housework - I Managing medications - I Managing personal finances - I   Confirmed importance and date/time of follow-up visits scheduled YES   Provider Appointment booked with Dr Everlena Cooper 12/02/20 at 10:30  Confirmed with patient if condition begins to worsen call PCP or go to the ER.  Patient was given the office number and encouraged to call back with question or concerns: Yes

## 2020-11-30 NOTE — Progress Notes (Addendum)
NEUROLOGY FOLLOW UP OFFICE NOTE  Andrea Hayes [id number]  Assessment/Plan:   1.  Idiopathic intracranial hypertension 2.  History of cerebral dural sinus thrombosis  1.  Continue acetazolamide 250mg  twice daily.  Her baby has a pediatrician appointment next month.  Advised to let the pediatrician know that she is taking the acetazolamide for pseudotumor cerebri. 2.  Advised to follow up with Dr. , ophthalmology, in 6 to 8 weeks for evaluation of papilledema. 3.  Follow up 6 months. 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  UPDATE: Underwent induction of labor at 37 weeks on 11/04/2020 and gave birth to a girl.  Baby is healthy. Patient is nursing.  She presented to the hospital on 11/28/2020 for severe headaches  MRI and MRV of brain with and without contrast was negative for acute stroke/bleed or acute venous thrombosis.  She was restarted on acetazolamide 250mg  twice daily.  Headaches have resolved.   HISTORY: She began experiencing blurred vision and intermittent headacheson top of head. In October.She also reports visual obscurations as well. 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 The next day she went to the ED for further evaluation and treatment. MRI of brain with and without contrast was personally reviewed and demonstrated tortuous optic nerves with prominent optic nerve sheath fluid which may be seen in setting of intracranial hypertension. Neurology was consulted and LP was recommended. However, she declined the procedure and left against medical advice.  She followed up with me in January 2020 andunderwent workup for intracranial hypertension. Lumbar puncture from  07/09/18 demonstrated an opening pressure of 47 cm water with closing pressure of 16 cm water. CSF cell count, glucose, protein and culture were normal. She was started on Diamox 500mg  twice daily. MRV of head from 07/12/18 was personally reviewed and was normal with no evidence of thrombosis or stenosis. She presented to [facility]18 for severe headache with nausea, vomiting and photophobia. CT of head was personally reviewed and showed dural venous sinus thrombosis involving the superior sagittal sinus, straight sinus, right transverse sinus and sigmoid sinus and smaller branching veins. Follow up CTV of head confirmed acute venous sinus thrombosis from superior sagittal sinus to right sigmoid sinus and within intracerebral veins. Hypercoagulable workup was unremarkable, includinghomocystein 4.8, antithrombin III mildly elevated at 128, low protein S activity of 22, total protein S 109, protein C activity 180, total protein C 127, lupus anticoagulant 25.3, DRVVT 44.7, beta-2-glycoprotein antibodies <9, factor V leiden mutation negative, prothrombin gene mutation negative, cardiolipin antibodies negative, sickle cell screen negative and negative for pregnancy. She was started on lovenox and bridged to warfarin. She was discharged on Fioricet for her headaches. She was started on topiramate 50mg  twice daily.Headache never improved and returned to the ED on 08/04/18. INR was therapeutic at2.43. CT personally reviewed and showed subacute dural venous sinus thrombosis and intracerebral veins with partial recanalization and no acute component. She received IV Reglan and Benadryl and headache resolved. Headaches subsequently subsided, so she self-discontinued medications - warfarin, acetazolamide and topiramate, and lost to follow up.  She became pregnant in late 2021.  Started having headaches and pulsatile tinnitus again.  MRV of head without contrast on 05/24/2020 showed  worsened segmental  superior sagittal sinus thrombosis (compared to CTV 08/05/18) and flow in both transverse and sigmoid sinuses but with chronic scarring of right transverse sinus region, likely sequelae of previous thrombosis/stroke but cannot rule out active/recent thrombosis.  Given that she had prematurely stopped warfarin, we took precaution and had her on Lovenox during her pregnancy.  She was also restarted on acetazolamide 500mg  twice daily.  She was hospitalized for headache on 08/12/2020.  Repeat MRI/MRV of head without contrast at that time were stable.  Headaches are improved.  Denies visual obscurations, blurred vision or pulsatile tinnitus.  She stopped taking the acetazolamide regularly.  She has not followed up with ophthalmology.   PAST MEDICAL HISTORY: Past Medical History:  Diagnosis Date  . Stroke [facility])     MEDICATIONS: Current Outpatient Medications on File Prior to Visit  Medication Sig Dispense Refill  . acetaminophen (TYLENOL) 325 MG tablet Take 2 tablets (650 mg total) by mouth every 6 (six) hours as needed for mild pain or headache (do not take over 3000 mg in a day of tylenol. please note there is tylenol in the fiorecet). (Patient not taking: Reported on 11/10/2020)    . acetaZOLAMIDE (DIAMOX) 125 MG tablet Take 2 tablets (250 mg total) by mouth 2 (two) times daily. Can increase to 500 mg twice daily if symptoms persist after 2 weeks. 90 tablet 2  . amoxicillin (AMOXIL) 500 MG capsule Take 1 capsule (500 mg total) by mouth 2 (two) times daily for 5 days. 10 capsule 0  . Prenatal Vit-Fe Fumarate-FA (PRENATAL VITAMIN) 27-0.8 MG TABS Take 1 tablet by mouth daily. 30 tablet 12   No current facility-administered medications on file prior to visit.    ALLERGIES: No Known Allergies  FAMILY HISTORY: Family History  Problem Relation Age of Onset  . Asthma Father   . Obesity Neg Hx   . Hypertension Neg Hx   . Diabetes Neg Hx   . Cancer Neg Hx       Objective:  Blood pressure 107/71,  pulse 84, height 5\' 5"  (1.651 m), weight 156 lb 12.8 oz (71.1 kg), SpO2 97 %, unknown if currently breastfeeding. General: No acute distress.  Patient appears well-groomed.   Head:  Normocephalic/atraumatic Eyes:  Fundi examined but not visualized Neck: supple, no paraspinal tenderness, full range of motion Heart:  Regular rate and rhythm Lungs:  Clear to auscultation bilaterally Back: No paraspinal tenderness Neurological Exam: alert and oriented to person, place, and time.  Speech fluent and not dysarthric, language intact.  CN II-XII intact. Bulk and tone normal, muscle strength 5/5 throughout.  Sensation to light touch intact.  Deep tendon reflexes 2+ throughout.  Finger to nose testing intact.  Gait normal, Romberg negative.   01/10/2021, DO  CC: , MD

## 2020-12-02 ENCOUNTER — Other Ambulatory Visit: Payer: Self-pay

## 2020-12-02 ENCOUNTER — Encounter: Payer: Self-pay | Admitting: Neurology

## 2020-12-02 ENCOUNTER — Ambulatory Visit (INDEPENDENT_AMBULATORY_CARE_PROVIDER_SITE_OTHER): Payer: 59 | Admitting: Neurology

## 2020-12-02 VITALS — BP 107/71 | HR 84 | Ht 65.0 in | Wt 156.8 lb

## 2020-12-02 DIAGNOSIS — G932 Benign intracranial hypertension: Secondary | ICD-10-CM | POA: Diagnosis not present

## 2020-12-02 DIAGNOSIS — Z86718 Personal history of other venous thrombosis and embolism: Secondary | ICD-10-CM | POA: Diagnosis not present

## 2020-12-02 MED ORDER — ACETAZOLAMIDE 125 MG PO TABS: 250.0000 mg | ORAL_TABLET | Freq: Two times a day (BID) | ORAL | 5 refills | Status: DC

## 2020-12-02 NOTE — Patient Instructions (Addendum)
1. Continue acetazolamide 250mg  twice daily.  Let your baby's pediatrician know that you are taking this for pseudotumor cerebri. 2.  Follow up with Dr. at [facility]hthalmology in 6 to 8 weeks (their phone number is [phone]) 3.  Follow up 6 months.

## 2020-12-05 ENCOUNTER — Ambulatory Visit (INDEPENDENT_AMBULATORY_CARE_PROVIDER_SITE_OTHER): Payer: 59 | Admitting: Advanced Practice Midwife

## 2020-12-05 ENCOUNTER — Other Ambulatory Visit: Payer: Self-pay

## 2020-12-05 DIAGNOSIS — Z8673 Personal history of transient ischemic attack (TIA), and cerebral infarction without residual deficits: Secondary | ICD-10-CM

## 2020-12-05 DIAGNOSIS — G932 Benign intracranial hypertension: Secondary | ICD-10-CM

## 2020-12-05 MED ORDER — ENOXAPARIN SODIUM 40 MG/0.4ML IJ SOSY: 40.0000 mg | PREFILLED_SYRINGE | INTRAMUSCULAR | 30 refills | Status: DC

## 2020-12-05 NOTE — Progress Notes (Signed)
Patient is here for postpartum check up. Postpartum course complicated by admission. She denies headache at present but states that she does have them sometimes. Patient is not using Lovenox as directed. Says she uses it sometimes but not everyday. Patient asking about a new RX for amoxicillin but this med is not on her med list. States she spilled the contents of the bottle. Patient declined use of a Nurse, learning disability.

## 2020-12-05 NOTE — Progress Notes (Signed)
Post Partum Visit Note  Andrea Hayes is a 30 y.o. [id number] female who presents for a postpartum visit. She is 5 weeks postpartum following a normal spontaneous vaginal delivery.  I have fully reviewed the prenatal and intrapartum course. The delivery was at [redacted]wks gestational weeks.  Anesthesia: none. Postpartum course has been complicated by stroke. Baby is doing well. Baby is feeding by breast. Bleeding thin lochia. Bowel function is normal. Bladder function is normal. Patient is sexually active. Contraception method is none. Postpartum depression screening: negative.   The pregnancy intention screening data noted above was reviewed. Potential methods of contraception were discussed. The patient elected to proceed with FAM or LAM.    Edinburgh Postnatal Depression Scale - 12/05/20 1326      Edinburgh Postnatal Depression Scale:  In the Past 7 Days   I have been able to laugh and see the funny side of things. 0    I have looked forward with enjoyment to things. 0    I have blamed myself unnecessarily when things went wrong. 0    I have been anxious or worried for no good reason. 0    I have felt scared or panicky for no good reason. 0    Things have been getting on top of me. 0    I have been so unhappy that I have had difficulty sleeping. 0    I have felt sad or miserable. 0    I have been so unhappy that I have been crying. 0    The thought of harming myself has occurred to me. 0    Edinburgh Postnatal Depression Scale Total 0           Health Maintenance Due  Topic Date Due  . Pneumococcal Vaccine 73-34 Years old (1 of 4 - PCV13) Never done    The following portions of the patient's history were reviewed and updated as appropriate: allergies, current medications, past family history, past medical history, past social history, past surgical history and problem list.  Review of Systems Pertinent items noted in HPI and remainder of comprehensive ROS otherwise  negative.  Objective:  BP 102/70   Pulse 90   Wt 160 lb (72.6 kg)   BMI 26.63 kg/m    VS reviewed, nursing note reviewed,  Constitutional: well developed, well nourished, no distress HEENT: normocephalic CV: normal rate Pulm/chest wall: normal effort Abdomen: soft Neuro: alert and oriented x 3 Skin: warm, dry Psych: affect normal   Assessment:   1. Postpartum care following vaginal delivery --PP course has been normal. Pt breastfeeding She desires another pregnancy and does not plan to prevent pregnancy other than LAM/natural family planning.    2. IIH (idiopathic intracranial hypertension)  3. History of arterial ischemic stroke --Consult Dr Earlene Plater.  Pt saw Dr Everlena Cooper, [facility] neurologist last week on 12/02/20.   --Pt was started on Lovenox during pregnancy but was transitioned off of anticoagulants before her pregnancy.   --Message to Dr Jaffe/Neurology for plan regarding anticoagulation after 6 weeks postpartum.  Pt is currently 5 weeks PP so Rx renewed and pt to continue until she follows up with neurology. - enoxaparin (LOVENOX) 40 MG/0.4ML injection; Inject 0.4 mLs (40 mg total) into the skin daily.  Dispense: 0.4 mL; Refill: 30  4. Pseudotumor cerebri --continue Diamox daily    Plan:   Essential components of care per ACOG recommendations:  1.  Mood and well being: Patient with negative depression screening today. Reviewed local resources for  support.  - Patient tobacco use? No.   - hx of drug use? No.    2. Infant care and feeding:  -Patient currently breastmilk feeding? Yes. Discussed returning to work and pumping.  -Social determinants of health (SDOH) reviewed in EPIC. No concerns  3. Sexuality, contraception and birth spacing - Patient does want a pregnancy in the next year.    - Reviewed forms of contraception in tiered fashion. Patient desired lactational amenorrhea method today.   - Discussed birth spacing of 18 months  4. Sleep and  fatigue -Encouraged family/partner/community support of 4 hrs of uninterrupted sleep to help with mood and fatigue  5. Physical Recovery  - Discussed patients delivery and complications. She describes her labor as good. - Patient had a Vaginal, no problems at delivery. Patient had a 2nd degree laceration. Perineal healing reviewed. Patient expressed understanding - Patient has urinary incontinence? No. - Patient is safe to resume physical and sexual activity  6.  Health Maintenance - HM due items addressed Yes - Last pap smear  Diagnosis  Date Value Ref Range Status  05/05/2020   Final   - Negative for intraepithelial lesion or malignancy (NILM)   Pap smear not done at today's visit.  -Breast Cancer screening indicated? No.   7. Chronic Disease/Pregnancy Condition follow up: See above, neurology follow up  - PCP follow up  Sharen Counter, CNM [facility], [facility]

## 2020-12-06 ENCOUNTER — Telehealth: Payer: Self-pay | Admitting: Neurology

## 2020-12-06 NOTE — Telephone Encounter (Signed)
No calls made from back staff today.

## 2020-12-06 NOTE — Telephone Encounter (Signed)
Patient called in returning our call

## 2020-12-07 ENCOUNTER — Telehealth: Payer: Self-pay

## 2020-12-07 NOTE — Telephone Encounter (Signed)
-----   Message from Hurshel Party, CNM sent at 12/07/2020 10:31 AM EDT ----- Regarding: FW: Postpartum patient with anticoagulation needs This postpartum patient is still doing Lovenox injections daily. I communicated with neurology (see below) and Dr Everlena Cooper says she can stop the injections. I am home until Monday. Please call to tell her to finish this week, until the baby is 61 weeks old, then stop and follow neurology as planned. She and I already talked about this so she is expecting a call. She does not need interpreter. Thank you. ----- Message ----- From: Drema Dallas, DO Sent: 12/06/2020  12:29 PM EDT To: Hurshel Party, CNM Subject: RE: Postpartum patient with anticoagulation #  The Lovenox was started more as a prophylaxis, so I think it would be okay to discontinue - I will addendum my note to address this.  Thank you ----- Message ----- From: Hurshel Party, CNM Sent: 12/05/2020   6:03 PM EDT To: Drema Dallas, DO Subject: Postpartum patient with anticoagulation needs  Dr Everlena Cooper,  This patient is now 5 weeks postpartum after vaginal delivery and was co-managed during her pregnancy by [facility] and [facility] [place]. She was then scheduled with me, a nurse-midwife, and not OB/Gyn physician for her postpartum visit.  My understanding from chart review is that she took Lovenox and was transitioned to warfarin after her ischemic stroke but was then off anticoagulants prior to pregnancy. During her pregnancy, you recommended Lovenox so she was on treatment dose during pregnancy. She was lowered to preventive dose postpartum and is currently taking 40 mg daily.  She reports at her postpartum visit today that she is NOT taking it every day.  I renewed the Rx and encouraged her to continue taking it daily until she follows up with you.  She saw you on 12/02/20, but I do not see a recommendation for anticoagulation after the postpartum period.  You  can let me know and I can discontinue the Lovenox or follow up with the patient directly about when to stop the therapy.  Thank you.  Sharen Counter, CNM

## 2020-12-07 NOTE — Telephone Encounter (Signed)
Unable to leave message for patient as phone rang out to a busy signal.

## 2020-12-08 ENCOUNTER — Telehealth: Payer: Self-pay

## 2020-12-08 NOTE — Telephone Encounter (Signed)
I was able to reach patient and make her aware of Lisa's recommendations. Pt voiced understanding and agreeable.

## 2020-12-08 NOTE — Telephone Encounter (Signed)
-----   Message from Lisa A Leftwich-Kirby, CNM sent at 12/07/2020 10:31 AM EDT ----- Regarding: FW: Postpartum patient with anticoagulation needs This postpartum patient is still doing Lovenox injections daily. I communicated with neurology (see below) and Dr Jaffe says she can stop the injections. I am home until Monday. Please call to tell her to finish this week, until the baby is 6 weeks old, then stop and follow neurology as planned. She and I already talked about this so she is expecting a call. She does not need interpreter. Thank you. ----- Message ----- From: Jaffe, Adam R, DO Sent: 12/06/2020  12:29 PM EDT To: Lisa A Leftwich-Kirby, CNM Subject: RE: Postpartum patient with anticoagulation #  The Lovenox was started more as a prophylaxis, so I think it would be okay to discontinue - I will addendum my note to address this.  Thank you ----- Message ----- From: Leftwich-Kirby, Lisa A, CNM Sent: 12/05/2020   6:03 PM EDT To: Adam R Jaffe, DO Subject: Postpartum patient with anticoagulation needs  Dr Jaffe,  This patient is now 5 weeks postpartum after vaginal delivery and was co-managed during her pregnancy by [place] and [facility] [place]. She was then scheduled with me, a nurse-midwife, and not OB/Gyn physician for her postpartum visit.  My understanding from chart review is that she took Lovenox and was transitioned to warfarin after her ischemic stroke but was then off anticoagulants prior to pregnancy. During her pregnancy, you recommended Lovenox so she was on treatment dose during pregnancy. She was lowered to preventive dose postpartum and is currently taking 40 mg daily.  She reports at her postpartum visit today that she is NOT taking it every day.  I renewed the Rx and encouraged her to continue taking it daily until she follows up with you.  She saw you on 12/02/20, but I do not see a recommendation for anticoagulation after the postpartum period.  You  can let me know and I can discontinue the Lovenox or follow up with the patient directly about when to stop the therapy.  Thank you.  Lisa Leftwich-Kirby, CNM   

## 2021-01-21 ENCOUNTER — Other Ambulatory Visit: Payer: Self-pay

## 2021-01-21 ENCOUNTER — Encounter (HOSPITAL_COMMUNITY): Payer: Self-pay

## 2021-01-21 ENCOUNTER — Emergency Department (HOSPITAL_COMMUNITY)
Admission: EM | Admit: 2021-01-21 | Discharge: 2021-01-22 | Disposition: A | Discharge status: Home / Self Care | Payer: 59 | Source: Home / Self Care

## 2021-01-21 DIAGNOSIS — I2699 Other pulmonary embolism without acute cor pulmonale: Secondary | ICD-10-CM | POA: Diagnosis not present

## 2021-01-21 DIAGNOSIS — Z5321 Procedure and treatment not carried out due to patient leaving prior to being seen by health care provider: Secondary | ICD-10-CM | POA: Insufficient documentation

## 2021-01-21 DIAGNOSIS — M549 Dorsalgia, unspecified: Secondary | ICD-10-CM | POA: Insufficient documentation

## 2021-01-21 DIAGNOSIS — R109 Unspecified abdominal pain: Secondary | ICD-10-CM | POA: Insufficient documentation

## 2021-01-21 DIAGNOSIS — Z20822 Contact with and (suspected) exposure to covid-19: Secondary | ICD-10-CM | POA: Insufficient documentation

## 2021-01-21 LAB — CBC WITH DIFFERENTIAL/PLATELET
Abs Immature Granulocytes: 0.08 10*3/uL — ABNORMAL HIGH (ref 0.00–0.07)
Basophils Absolute: 0.1 10*3/uL (ref 0.0–0.1)
Basophils Relative: 0 %
Eosinophils Absolute: 0 10*3/uL (ref 0.0–0.5)
Eosinophils Relative: 0 %
HCT: 42.4 % (ref 36.0–46.0)
Hemoglobin: 13.4 g/dL (ref 12.0–15.0)
Immature Granulocytes: 1 %
Lymphocytes Relative: 11 %
Lymphs Abs: 1.6 10*3/uL (ref 0.7–4.0)
MCH: 28.4 pg (ref 26.0–34.0)
MCHC: 31.6 g/dL (ref 30.0–36.0)
MCV: 89.8 fL (ref 80.0–100.0)
Monocytes Absolute: 1.5 10*3/uL — ABNORMAL HIGH (ref 0.1–1.0)
Monocytes Relative: 10 %
Neutro Abs: 12.1 10*3/uL — ABNORMAL HIGH (ref 1.7–7.7)
Neutrophils Relative %: 78 %
Platelets: 135 10*3/uL — ABNORMAL LOW (ref 150–400)
RBC: 4.72 MIL/uL (ref 3.87–5.11)
RDW: 12.6 % (ref 11.5–15.5)
WBC: 15.4 10*3/uL — ABNORMAL HIGH (ref 4.0–10.5)
nRBC: 0 % (ref 0.0–0.2)

## 2021-01-21 LAB — I-STAT BETA HCG BLOOD, ED (MC, WL, AP ONLY): I-stat hCG, quantitative: <5 m[IU]/mL (ref <5)

## 2021-01-21 MED ORDER — FENTANYL CITRATE (PF) 100 MCG/2ML IJ SOLN
INTRAMUSCULAR | Status: AC
  Administered 2021-01-21: 50 ug via NASAL
  Filled 2021-01-21: qty 2

## 2021-01-21 MED ORDER — FENTANYL CITRATE (PF) 100 MCG/2ML IJ SOLN: 50.0000 ug | INTRAMUSCULAR | Status: DC | PRN

## 2021-01-21 MED ORDER — FENTANYL CITRATE (PF) 100 MCG/2ML IJ SOLN: 50.0000 ug | Freq: Once | INTRAMUSCULAR | Status: DC

## 2021-01-21 NOTE — ED Triage Notes (Signed)
Pt arrives POV for eval of severe R sided back pain x 2 days. Appears remarkably uncomfortable in triage, tearful. Endorses hx of kidney stones. States pain all over, but reports primarily R side.+CVA tenderness.

## 2021-01-21 NOTE — ED Provider Notes (Signed)
Emergency Medicine Provider Triage Evaluation Note  Andrea Hayes , a 30 y.o. female  was evaluated in triage.  Pt complains of right flank pain that started 2 days ago.  States her symptoms have been worsening.  Reports intermittent abdominal pain.  She states her pain starts in her back and radiates through her abdomen, chest, and neck.  No nausea, vomiting, diarrhea, difficulty urinating, dysuria.  Patient denies any other complaints besides pain.  Physical Exam  There were no vitals taken for this visit. Gen:   Awake, tearful and appears to be in pain Resp:  Normal effort  MSK:   Moves extremities without difficulty  Other:  Right flank pain noted.  Medical Decision Making  Medically screening exam initiated at 11:14 PM.  Appropriate orders placed.  Bre Tafoya was informed that the remainder of the evaluation will be completed by another provider, this initial triage assessment does not replace that evaluation, and the importance of remaining in the ED until their evaluation is complete.  Will obtain basic labs.  Renal stone study.  Given intranasal fentanyl for acute pain.   Placido Sou, PA-C 01/21/21 2319    Gwyneth Sprout, MD 01/22/21 2330

## 2021-01-22 ENCOUNTER — Emergency Department (HOSPITAL_COMMUNITY): Payer: 59

## 2021-01-22 DIAGNOSIS — J189 Pneumonia, unspecified organism: Secondary | ICD-10-CM | POA: Diagnosis present

## 2021-01-22 DIAGNOSIS — M545 Low back pain, unspecified: Secondary | ICD-10-CM | POA: Diagnosis present

## 2021-01-22 DIAGNOSIS — R748 Abnormal levels of other serum enzymes: Secondary | ICD-10-CM | POA: Diagnosis present

## 2021-01-22 DIAGNOSIS — I2699 Other pulmonary embolism without acute cor pulmonale: Principal | ICD-10-CM | POA: Diagnosis present

## 2021-01-22 DIAGNOSIS — Z79899 Other long term (current) drug therapy: Secondary | ICD-10-CM

## 2021-01-22 DIAGNOSIS — I10 Essential (primary) hypertension: Secondary | ICD-10-CM | POA: Diagnosis present

## 2021-01-22 DIAGNOSIS — I82220 Acute embolism and thrombosis of inferior vena cava: Secondary | ICD-10-CM | POA: Diagnosis present

## 2021-01-22 DIAGNOSIS — J9601 Acute respiratory failure with hypoxia: Secondary | ICD-10-CM | POA: Diagnosis present

## 2021-01-22 DIAGNOSIS — Z8673 Personal history of transient ischemic attack (TIA), and cerebral infarction without residual deficits: Secondary | ICD-10-CM

## 2021-01-22 DIAGNOSIS — G932 Benign intracranial hypertension: Secondary | ICD-10-CM | POA: Diagnosis present

## 2021-01-22 DIAGNOSIS — D696 Thrombocytopenia, unspecified: Secondary | ICD-10-CM | POA: Diagnosis present

## 2021-01-22 DIAGNOSIS — Z20822 Contact with and (suspected) exposure to covid-19: Secondary | ICD-10-CM | POA: Diagnosis present

## 2021-01-22 LAB — COMPREHENSIVE METABOLIC PANEL
ALT: 111 U/L — ABNORMAL HIGH (ref 0–44)
AST: 67 U/L — ABNORMAL HIGH (ref 15–41)
Albumin: 3.4 g/dL — ABNORMAL LOW (ref 3.5–5.0)
Alkaline Phosphatase: 133 U/L — ABNORMAL HIGH (ref 38–126)
Anion gap: 9 (ref 5–15)
BUN: 6 mg/dL (ref 6–20)
CO2: 26 mmol/L (ref 22–32)
Calcium: 9.2 mg/dL (ref 8.9–10.3)
Chloride: 102 mmol/L (ref 98–111)
Creatinine, Ser: 0.66 mg/dL (ref 0.44–1.00)
GFR, Estimated: >60 mL/min (ref >60)
Glucose, Bld: 107 mg/dL — ABNORMAL HIGH (ref 70–99)
Potassium: 4.2 mmol/L (ref 3.5–5.1)
Sodium: 137 mmol/L (ref 135–145)
Total Bilirubin: 0.7 mg/dL (ref 0.3–1.2)
Total Protein: 8.1 g/dL (ref 6.5–8.1)

## 2021-01-22 LAB — URINALYSIS, ROUTINE W REFLEX MICROSCOPIC
Bilirubin Urine: NEGATIVE
Glucose, UA: NEGATIVE mg/dL
Hgb urine dipstick: NEGATIVE
Ketones, ur: NEGATIVE mg/dL
Nitrite: NEGATIVE
Protein, ur: >=300 mg/dL — AB
Specific Gravity, Urine: 1.024 (ref 1.005–1.030)
pH: 5 (ref 5.0–8.0)

## 2021-01-22 LAB — LIPASE, BLOOD: Lipase: 24 U/L (ref 11–51)

## 2021-01-22 LAB — RESP PANEL BY RT-PCR (FLU A&B, COVID) ARPGX2
Influenza A by PCR: NEGATIVE
Influenza B by PCR: NEGATIVE
SARS Coronavirus 2 by RT PCR: NEGATIVE

## 2021-01-22 MED ORDER — ACETAMINOPHEN 500 MG PO TABS
1000.0000 mg | ORAL_TABLET | Freq: Once | ORAL | Status: AC
  Administered 2021-01-22: 1000 mg via ORAL
  Filled 2021-01-22: qty 2

## 2021-01-22 MED ORDER — KETOROLAC TROMETHAMINE 15 MG/ML IJ SOLN
15.0000 mg | Freq: Once | INTRAMUSCULAR | Status: AC
  Administered 2021-01-22: 15 mg via INTRAMUSCULAR
  Filled 2021-01-22: qty 1

## 2021-01-22 NOTE — ED Notes (Addendum)
Pt left due to having a newborn at home

## 2021-01-23 ENCOUNTER — Inpatient Hospital Stay (HOSPITAL_COMMUNITY)
Admission: EM | Admit: 2021-01-23 | Discharge: 2021-01-26 | DRG: 175 | Disposition: A | Discharge status: Home / Self Care | Payer: 59 | Attending: Family Medicine | Admitting: Family Medicine

## 2021-01-23 ENCOUNTER — Emergency Department (HOSPITAL_COMMUNITY): Payer: 59

## 2021-01-23 ENCOUNTER — Other Ambulatory Visit: Payer: Self-pay

## 2021-01-23 ENCOUNTER — Inpatient Hospital Stay (HOSPITAL_COMMUNITY): Payer: 59

## 2021-01-23 ENCOUNTER — Encounter (HOSPITAL_COMMUNITY): Payer: Self-pay | Admitting: *Deleted

## 2021-01-23 DIAGNOSIS — I10 Essential (primary) hypertension: Secondary | ICD-10-CM | POA: Diagnosis present

## 2021-01-23 DIAGNOSIS — I2699 Other pulmonary embolism without acute cor pulmonale: Secondary | ICD-10-CM | POA: Diagnosis present

## 2021-01-23 DIAGNOSIS — D696 Thrombocytopenia, unspecified: Secondary | ICD-10-CM | POA: Diagnosis present

## 2021-01-23 DIAGNOSIS — J9601 Acute respiratory failure with hypoxia: Secondary | ICD-10-CM | POA: Diagnosis present

## 2021-01-23 DIAGNOSIS — I2609 Other pulmonary embolism with acute cor pulmonale: Secondary | ICD-10-CM | POA: Diagnosis not present

## 2021-01-23 DIAGNOSIS — M549 Dorsalgia, unspecified: Secondary | ICD-10-CM | POA: Diagnosis present

## 2021-01-23 DIAGNOSIS — M545 Low back pain, unspecified: Secondary | ICD-10-CM | POA: Diagnosis present

## 2021-01-23 DIAGNOSIS — I82220 Acute embolism and thrombosis of inferior vena cava: Secondary | ICD-10-CM

## 2021-01-23 DIAGNOSIS — R079 Chest pain, unspecified: Secondary | ICD-10-CM | POA: Diagnosis present

## 2021-01-23 DIAGNOSIS — Z8673 Personal history of transient ischemic attack (TIA), and cerebral infarction without residual deficits: Secondary | ICD-10-CM | POA: Diagnosis not present

## 2021-01-23 DIAGNOSIS — R748 Abnormal levels of other serum enzymes: Secondary | ICD-10-CM | POA: Diagnosis present

## 2021-01-23 DIAGNOSIS — J189 Pneumonia, unspecified organism: Secondary | ICD-10-CM | POA: Diagnosis present

## 2021-01-23 DIAGNOSIS — Z79899 Other long term (current) drug therapy: Secondary | ICD-10-CM | POA: Diagnosis not present

## 2021-01-23 DIAGNOSIS — R7989 Other specified abnormal findings of blood chemistry: Secondary | ICD-10-CM

## 2021-01-23 DIAGNOSIS — G932 Benign intracranial hypertension: Secondary | ICD-10-CM | POA: Diagnosis present

## 2021-01-23 DIAGNOSIS — I6930 Unspecified sequelae of cerebral infarction: Secondary | ICD-10-CM

## 2021-01-23 DIAGNOSIS — D72829 Elevated white blood cell count, unspecified: Secondary | ICD-10-CM | POA: Diagnosis present

## 2021-01-23 DIAGNOSIS — Z20822 Contact with and (suspected) exposure to covid-19: Secondary | ICD-10-CM | POA: Diagnosis present

## 2021-01-23 LAB — URINALYSIS, ROUTINE W REFLEX MICROSCOPIC
Bacteria, UA: NONE SEEN
Bilirubin Urine: NEGATIVE
Glucose, UA: NEGATIVE mg/dL
Hgb urine dipstick: NEGATIVE
Ketones, ur: NEGATIVE mg/dL
Leukocytes,Ua: NEGATIVE
Nitrite: NEGATIVE
Protein, ur: >=300 mg/dL — AB
Specific Gravity, Urine: 1.019 (ref 1.005–1.030)
pH: 6 (ref 5.0–8.0)

## 2021-01-23 LAB — APTT: aPTT: 30 seconds (ref 24–36)

## 2021-01-23 LAB — CBC WITH DIFFERENTIAL/PLATELET
Abs Immature Granulocytes: 0.12 10*3/uL — ABNORMAL HIGH (ref 0.00–0.07)
Basophils Absolute: 0.1 10*3/uL (ref 0.0–0.1)
Basophils Relative: 1 %
Eosinophils Absolute: 0 10*3/uL (ref 0.0–0.5)
Eosinophils Relative: 0 %
HCT: 37.8 % (ref 36.0–46.0)
Hemoglobin: 12 g/dL (ref 12.0–15.0)
Immature Granulocytes: 1 %
Lymphocytes Relative: 13 %
Lymphs Abs: 1.9 10*3/uL (ref 0.7–4.0)
MCH: 28.6 pg (ref 26.0–34.0)
MCHC: 31.7 g/dL (ref 30.0–36.0)
MCV: 90 fL (ref 80.0–100.0)
Monocytes Absolute: 1.7 10*3/uL — ABNORMAL HIGH (ref 0.1–1.0)
Monocytes Relative: 12 %
Neutro Abs: 11 10*3/uL — ABNORMAL HIGH (ref 1.7–7.7)
Neutrophils Relative %: 73 %
Platelets: 145 10*3/uL — ABNORMAL LOW (ref 150–400)
RBC: 4.2 MIL/uL (ref 3.87–5.11)
RDW: 12.8 % (ref 11.5–15.5)
WBC: 14.8 10*3/uL — ABNORMAL HIGH (ref 4.0–10.5)
nRBC: 0 % (ref 0.0–0.2)

## 2021-01-23 LAB — COMPREHENSIVE METABOLIC PANEL
ALT: 98 U/L — ABNORMAL HIGH (ref 0–44)
AST: 82 U/L — ABNORMAL HIGH (ref 15–41)
Albumin: 2.7 g/dL — ABNORMAL LOW (ref 3.5–5.0)
Alkaline Phosphatase: 151 U/L — ABNORMAL HIGH (ref 38–126)
Anion gap: 8 (ref 5–15)
BUN: 7 mg/dL (ref 6–20)
CO2: 26 mmol/L (ref 22–32)
Calcium: 9.1 mg/dL (ref 8.9–10.3)
Chloride: 101 mmol/L (ref 98–111)
Creatinine, Ser: 0.64 mg/dL (ref 0.44–1.00)
GFR, Estimated: >60 mL/min (ref >60)
Glucose, Bld: 117 mg/dL — ABNORMAL HIGH (ref 70–99)
Potassium: 3.9 mmol/L (ref 3.5–5.1)
Sodium: 135 mmol/L (ref 135–145)
Total Bilirubin: 1 mg/dL (ref 0.3–1.2)
Total Protein: 7.9 g/dL (ref 6.5–8.1)

## 2021-01-23 LAB — I-STAT VENOUS BLOOD GAS, ED
Acid-Base Excess: 3 mmol/L — ABNORMAL HIGH (ref 0.0–2.0)
Bicarbonate: 28.2 mmol/L — ABNORMAL HIGH (ref 20.0–28.0)
Calcium, Ion: 1.16 mmol/L (ref 1.15–1.40)
HCT: 38 % (ref 36.0–46.0)
Hemoglobin: 12.9 g/dL (ref 12.0–15.0)
O2 Saturation: 93 %
Potassium: 4 mmol/L (ref 3.5–5.1)
Sodium: 137 mmol/L (ref 135–145)
TCO2: 30 mmol/L (ref 22–32)
pCO2, Ven: 43.6 mmHg — ABNORMAL LOW (ref 44.0–60.0)
pH, Ven: 7.418 (ref 7.250–7.430)
pO2, Ven: 65 mmHg — ABNORMAL HIGH (ref 32.0–45.0)

## 2021-01-23 LAB — RESP PANEL BY RT-PCR (FLU A&B, COVID) ARPGX2
Influenza A by PCR: NEGATIVE
Influenza B by PCR: NEGATIVE
SARS Coronavirus 2 by RT PCR: NEGATIVE

## 2021-01-23 LAB — PROTIME-INR
INR: 1.1 (ref 0.8–1.2)
Prothrombin Time: 14.1 seconds (ref 11.4–15.2)

## 2021-01-23 LAB — LACTIC ACID, PLASMA: Lactic Acid, Venous: 0.7 mmol/L (ref 0.5–1.9)

## 2021-01-23 LAB — TROPONIN I (HIGH SENSITIVITY): Troponin I (High Sensitivity): 8 ng/L (ref <18)

## 2021-01-23 LAB — D-DIMER, QUANTITATIVE: D-Dimer, Quant: 9.43 ug/mL-FEU — ABNORMAL HIGH (ref 0.00–0.50)

## 2021-01-23 MED ORDER — LIDOCAINE 5 % EX PTCH
1.0000 | MEDICATED_PATCH | CUTANEOUS | Status: DC
  Administered 2021-01-23 – 2021-01-25 (×3): 1 via TRANSDERMAL
  Filled 2021-01-23 (×3): qty 1

## 2021-01-23 MED ORDER — HEPARIN (PORCINE) 25000 UT/250ML-% IV SOLN
1300.0000 [IU]/h | INTRAVENOUS | Status: DC
  Administered 2021-01-23: 1300 [IU]/h via INTRAVENOUS
  Filled 2021-01-23: qty 250

## 2021-01-23 MED ORDER — ENOXAPARIN SODIUM 80 MG/0.8ML IJ SOSY
80.0000 mg | PREFILLED_SYRINGE | Freq: Two times a day (BID) | INTRAMUSCULAR | Status: DC
  Administered 2021-01-23 – 2021-01-26 (×7): 80 mg via SUBCUTANEOUS
  Filled 2021-01-23 (×8): qty 0.8

## 2021-01-23 MED ORDER — ACETAMINOPHEN 325 MG PO TABS: 650.0000 mg | ORAL_TABLET | Freq: Four times a day (QID) | ORAL | Status: DC | PRN

## 2021-01-23 MED ORDER — HYDROMORPHONE HCL 1 MG/ML IJ SOLN
1.0000 mg | Freq: Once | INTRAMUSCULAR | Status: AC
  Administered 2021-01-23: 1 mg via INTRAVENOUS
  Filled 2021-01-23: qty 1

## 2021-01-23 MED ORDER — ACETAMINOPHEN 650 MG RE SUPP
650.0000 mg | Freq: Four times a day (QID) | RECTAL | Status: DC | PRN
Start: 2021-01-23 — End: 2021-01-23

## 2021-01-23 MED ORDER — SODIUM CHLORIDE 0.9 % IV BOLUS
2000.0000 mL | Freq: Once | INTRAVENOUS | Status: AC
  Administered 2021-01-23: 2000 mL via INTRAVENOUS

## 2021-01-23 MED ORDER — SODIUM CHLORIDE 0.9 % IV SOLN: 250.0000 mL | INTRAVENOUS | Status: DC | PRN

## 2021-01-23 MED ORDER — MORPHINE SULFATE (PF) 2 MG/ML IV SOLN
1.0000 mg | INTRAVENOUS | Status: DC | PRN
  Administered 2021-01-23: 1 mg via INTRAVENOUS
  Filled 2021-01-23 (×2): qty 1

## 2021-01-23 MED ORDER — HEPARIN BOLUS VIA INFUSION
5500.0000 [IU] | Freq: Once | INTRAVENOUS | Status: AC
  Administered 2021-01-23: 5500 [IU] via INTRAVENOUS
  Filled 2021-01-23: qty 5500

## 2021-01-23 MED ORDER — SODIUM CHLORIDE 0.9% FLUSH: 3.0000 mL | INTRAVENOUS | Status: DC | PRN

## 2021-01-23 MED ORDER — FENTANYL CITRATE (PF) 100 MCG/2ML IJ SOLN
100.0000 ug | Freq: Once | INTRAMUSCULAR | Status: AC
  Administered 2021-01-23: 100 ug via INTRAVENOUS
  Filled 2021-01-23: qty 2

## 2021-01-23 MED ORDER — WARFARIN - PHARMACIST DOSING INPATIENT: Freq: Every day | Status: DC

## 2021-01-23 MED ORDER — PRENATAL MULTIVITAMIN CH
1.0000 | ORAL_TABLET | Freq: Every day | ORAL | Status: DC
  Administered 2021-01-23 – 2021-01-25 (×2): 1 via ORAL
  Filled 2021-01-23 (×4): qty 1

## 2021-01-23 MED ORDER — HYDROCODONE-ACETAMINOPHEN 5-325 MG PO TABS
1.0000 | ORAL_TABLET | ORAL | Status: DC | PRN
  Administered 2021-01-23: 1 via ORAL
  Administered 2021-01-24 – 2021-01-25 (×2): 2 via ORAL
  Administered 2021-01-25: 1 via ORAL
  Filled 2021-01-23: qty 1
  Filled 2021-01-23: qty 2
  Filled 2021-01-23: qty 1
  Filled 2021-01-23: qty 2

## 2021-01-23 MED ORDER — SODIUM CHLORIDE 0.9% FLUSH
3.0000 mL | Freq: Two times a day (BID) | INTRAVENOUS | Status: DC
  Administered 2021-01-23 – 2021-01-26 (×6): 3 mL via INTRAVENOUS

## 2021-01-23 MED ORDER — IOHEXOL 350 MG/ML SOLN
100.0000 mL | Freq: Once | INTRAVENOUS | Status: AC | PRN
  Administered 2021-01-23: 100 mL via INTRAVENOUS

## 2021-01-23 MED ORDER — ACETAMINOPHEN 325 MG PO TABS
650.0000 mg | ORAL_TABLET | Freq: Once | ORAL | Status: AC
  Administered 2021-01-23: 650 mg via ORAL
  Filled 2021-01-23: qty 2

## 2021-01-23 MED ORDER — HYDROMORPHONE HCL 1 MG/ML IJ SOLN
0.5000 mg | Freq: Once | INTRAMUSCULAR | Status: AC
  Administered 2021-01-23: 0.5 mg via INTRAVENOUS
  Filled 2021-01-23: qty 1

## 2021-01-23 MED ORDER — WARFARIN SODIUM 10 MG PO TABS
10.0000 mg | ORAL_TABLET | Freq: Once | ORAL | Status: AC
  Administered 2021-01-23: 10 mg via ORAL
  Filled 2021-01-23 (×2): qty 1

## 2021-01-23 MED ORDER — ONDANSETRON HCL 4 MG/2ML IJ SOLN
4.0000 mg | Freq: Once | INTRAMUSCULAR | Status: AC
  Administered 2021-01-23: 4 mg via INTRAVENOUS
  Filled 2021-01-23: qty 2

## 2021-01-23 NOTE — ED Provider Notes (Signed)
[facility] EMERGENCY DEPARTMENT Provider Note   CSN: [id number] Arrival date & time: 01/22/21  2315     History Chief Complaint  Patient presents with  . Back Pain    Andrea Hayes is a 30 y.o. female.  HPI She presents for evaluation of right flank pain.  She is currently breast-feeding her 33-month-old child.  Presented to the ED, greater than 9 hours ago, and had a prolonged wait in the waiting room.  She reports having pain and her posterior right chest, right lower back, neck, and right arm for 3 days.  She reports having fever for 3 days.  She denies dysuria, urinary frequency, cough, nausea or vomiting.  She is feeling short of breath.  She is still breast-feeding. No known sick contacts.  There are no other known active modifying factors    Past Medical History:  Diagnosis Date  . Stroke [facility])     Patient Active Problem List   Diagnosis Date Noted  . Vaginal delivery 12/05/2020  . Headache 11/28/2020  . HTN (hypertension) 11/28/2020  . Severe preeclampsia, delivered 11/04/2020  . IUGR (intrauterine growth restriction) affecting care of mother 10/31/2020  . Thrombocytopenia affecting pregnancy (HCC) 05/27/2020  . Acute right arterial ischemic stroke, middle cerebral artery (MCA) (HCC)   . Dural venous sinus thrombosis   . Intracranial hypertension   . Cerebral venous thrombosis 07/24/2018    Past Surgical History:  Procedure Laterality Date  . NO PAST SURGERIES       OB History     Gravida  1   Para  1   Term  1   Preterm      AB      Living  1      SAB      IAB      Ectopic      Multiple  0   Live Births  1           Family History  Problem Relation Age of Onset  . Asthma Father   . Obesity Neg Hx   . Hypertension Neg Hx   . Diabetes Neg Hx   . Cancer Neg Hx     Social History   Tobacco Use  . Smoking status: Never  . Smokeless tobacco: Never  Vaping Use  . Vaping Use: Never used  Substance Use  Topics  . Alcohol use: No    Alcohol/week: 0.0 standard drinks  . Drug use: No    Home Medications Prior to Admission medications   Medication Sig Start Date End Date Taking? Authorizing Provider  acetaminophen (TYLENOL) 325 MG tablet Take 2 tablets (650 mg total) by mouth every 6 (six) hours as needed for mild pain or headache (do not take over 3000 mg in a day of tylenol. please note there is tylenol in the fiorecet). Patient not taking: Reported on 11/10/2020 07/29/18   Layne Benton, NP  acetaZOLAMIDE (DIAMOX) 125 MG tablet Take 2 tablets (250 mg total) by mouth 2 (two) times daily. 12/02/20   Everlena Cooper, Adam R, DO  enoxaparin (LOVENOX) 40 MG/0.4ML injection Inject 0.4 mLs (40 mg total) into the skin daily. 12/05/20   Leftwich-Kirby, Wilmer Floor, CNM  Prenatal Vit-Fe Fumarate-FA (PRENATAL VITAMIN) 27-0.8 MG TABS Take 1 tablet by mouth daily. 04/19/20   Aviva Signs, CNM    Allergies    Patient has no known allergies.  Review of Systems   Review of Systems  All other systems reviewed and are  negative.  Physical Exam Updated Vital Signs BP 117/74   Pulse (!) 117   Temp (!) 100.8 F (38.2 C) (Oral)   Resp (!) 26   Ht 5\' 5"  (1.651 m)   Wt 78.3 kg   SpO2 100%   BMI 28.73 kg/m   Physical Exam Vitals and nursing note reviewed.  Constitutional:      Appearance: She is well-developed. She is not ill-appearing.  HENT:     Head: Normocephalic and atraumatic.     Right Ear: External ear normal.     Left Ear: External ear normal.  Eyes:     Conjunctiva/sclera: Conjunctivae normal.     Pupils: Pupils are equal, round, and reactive to light.  Neck:     Trachea: Phonation normal.  Cardiovascular:     Rate and Rhythm: Normal rate.  Pulmonary:     Effort: Pulmonary effort is normal.  Abdominal:     General: There is no distension.  Musculoskeletal:        General: No swelling, tenderness or deformity. Normal range of motion.     Cervical back: Normal range of motion and neck supple.   Skin:    General: Skin is warm and dry.  Neurological:     Mental Status: She is alert and oriented to person, place, and time.     Cranial Nerves: No cranial nerve deficit.     Sensory: No sensory deficit.     Motor: No abnormal muscle tone.     Coordination: Coordination normal.  Psychiatric:        Mood and Affect: Mood normal.        Behavior: Behavior normal.        Thought Content: Thought content normal.        Judgment: Judgment normal.    ED Results / Procedures / Treatments   Labs (all labs ordered are listed, but only abnormal results are displayed) Labs Reviewed  COMPREHENSIVE METABOLIC PANEL - Abnormal; Notable for the following components:      Result Value   Glucose, Bld 117 (*)    Albumin 2.7 (*)    AST 82 (*)    ALT 98 (*)    Alkaline Phosphatase 151 (*)    All other components within normal limits  CBC WITH DIFFERENTIAL/PLATELET - Abnormal; Notable for the following components:   WBC 14.8 (*)    Platelets 145 (*)    Neutro Abs 11.0 (*)    Monocytes Absolute 1.7 (*)    Abs Immature Granulocytes 0.12 (*)    All other components within normal limits  D-DIMER, QUANTITATIVE - Abnormal; Notable for the following components:   D-Dimer, Quant 9.43 (*)    All other components within normal limits  I-STAT VENOUS BLOOD GAS, ED - Abnormal; Notable for the following components:   pCO2, Ven 43.6 (*)    pO2, Ven 65.0 (*)    Bicarbonate 28.2 (*)    Acid-Base Excess 3.0 (*)    All other components within normal limits  RESP PANEL BY RT-PCR (FLU A&B, COVID) ARPGX2  CULTURE, BLOOD (ROUTINE X 2)  CULTURE, BLOOD (ROUTINE X 2)  LACTIC ACID, PLASMA  APTT  PROTIME-INR  HEPARIN LEVEL (UNFRACTIONATED)    EKG EKG Interpretation  Date/Time:  Monday January 23 2021 00:45:10 EDT Ventricular Rate:  127 PR Interval:  142 QRS Duration: 62 QT Interval:  298 QTC Calculation: 433 R Axis:   65 Text Interpretation: Sinus tachycardia Biatrial enlargement Cannot rule out  Anterior infarct ,  age undetermined Abnormal ECG since last tracing no significant change Confirmed by Mancel Bale [phone]) on 01/23/2021 9:15:56 AM  Radiology DG Chest 2 View  Result Date: 01/23/2021 CLINICAL DATA:  Right upper back/chest pain. EXAM: CHEST - 2 VIEW COMPARISON:  January 27, 2020 FINDINGS: Decreased lung volumes are seen which is likely, in part, secondary to the degree of patient inspiration. Subsequent crowding of the bronchovascular lung markings is seen. Mild right basilar atelectasis and/or infiltrate is present. There is no evidence of a pleural effusion or pneumothorax. The heart size and mediastinal contours are within normal limits. The visualized skeletal structures are unremarkable. IMPRESSION: Low lung volumes with mild right basilar atelectasis and/or infiltrate. Electronically Signed   By: Aram Candela M.D.   On: 01/23/2021 01:58   CT Renal Stone Study  Result Date: 01/22/2021 CLINICAL DATA:  Severe right back pain for 2 days EXAM: CT ABDOMEN AND PELVIS WITHOUT CONTRAST TECHNIQUE: Multidetector CT imaging of the abdomen and pelvis was performed following the standard protocol without IV contrast. COMPARISON:  CT 07/13/2017 FINDINGS: Lower chest: Mixed consolidative and ground-glass opacities seen throughout the right lower lobe and minimally in the right middle lobe. Atelectatic changes in the lung bases as well. Normal heart size. No pericardial effusion. Hepatobiliary: No visible focal liver lesion within the limitations of this unenhanced CT. Smooth surface contour. No concerning focal liver lesion. Normal gallbladder and biliary tree. Pancreas: No pancreatic ductal dilatation or surrounding inflammatory changes. Spleen: Normal in size. No concerning splenic lesions. Adrenals/Urinary Tract: Normal adrenals. Kidneys are normally located with symmetric enhancement and excretion. No suspicious renal lesion, urolithiasis or hydronephrosis. Urinary bladder is largely  decompressed at the time of exam and therefore poorly evaluated by CT imaging. Mild bladder wall thickening likely related to underdistention. Stomach/Bowel: Distal esophagus, stomach and duodenum are unremarkable. Challenging assessment of the bowel and mesentery in the absence of contrast media and given limited intraperitoneal fat. No conspicuous bowel wall thickening or dilatation. Normal appendix in the right lower quadrant. Moderate colonic stool burden. No worrisome colonic thickening or dilatation. No evidence of bowel obstruction. Vascular/Lymphatic: No significant vascular findings are present. No enlarged abdominal or pelvic lymph nodes. Reproductive: Slightly retroverted uterus. No concerning adnexal masses. Other: No abdominopelvic free air or fluid. Postsurgical changes of the anterior abdominal wall. No bowel containing hernia. Small fat containing umbilical hernia. Musculoskeletal: No acute osseous abnormality or suspicious osseous lesion. 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 IMPRESSION: 1. Mixed consolidative and ground-glass opacities throughout the right lower lobe and minimally in the right middle lobe. Could reflect an acute infectious or inflammatory process. 2. No other acute CT abnormality to provide a cause for patient's symptoms. Specifically, no urolithiasis or hydronephrosis. 3. Mild bladder wall thickening may be related to underdistention though could correlate with urinalysis. 4. Moderate colonic stool burden. Correlate for slowed transit/features of constipation. 5. Trace free fluid in the deep pelvis, can be physiologic in a reproductive age female. 6. 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 Electronically Signed   By: Kreg Shropshire M.D.   On: 01/22/2021 00:29    Procedures .Critical Care  Date/Time: 01/23/2021 12:43 PM Performed by: Mancel Bale, MD Authorized by: Mancel Bale, MD   Critical care provider statement:    Critical care time  (minutes):  45   Critical care start time:  01/23/2021 9:10 AM   Critical care end time:  01/23/2021 12:43 PM   Critical care time was exclusive of:  Separately billable procedures and  treating other patients   Critical care was time spent personally by me on the following activities:  Blood draw for specimens, development of treatment plan with patient or surrogate, discussions with consultants, evaluation of patient's response to treatment, examination of patient, obtaining history from patient or surrogate, ordering and performing treatments and interventions, ordering and review of laboratory studies, pulse oximetry, re-evaluation of patient's condition, review of old charts and ordering and review of radiographic studies   Medications Ordered in ED Medications  heparin bolus via infusion 5,500 Units (has no administration in time range)  heparin ADULT infusion 100 units/mL (25000 units/292mL) (has no administration in time range)  fentaNYL (SUBLIMAZE) injection 100 mcg (100 mcg Intravenous Given 01/23/21 0946)  ondansetron (ZOFRAN) injection 4 mg (4 mg Intravenous Given 01/23/21 0944)  sodium chloride 0.9 % bolus 2,000 mL (2,000 mLs Intravenous New Bag/Given 01/23/21 0955)  acetaminophen (TYLENOL) tablet 650 mg (650 mg Oral Given 01/23/21 0958)  HYDROmorphone (DILAUDID) injection 1 mg (1 mg Intravenous Given 01/23/21 1047)  iohexol (OMNIPAQUE) 350 MG/ML injection 100 mL (100 mLs Intravenous Contrast Given 01/23/21 1129)    ED Course  I have reviewed the triage vital signs and the nursing notes.  Pertinent labs & imaging results that were available during my care of the patient were reviewed by me and considered in my medical decision making (see chart for details).  Clinical Course as of 01/23/21 1214  Mon Jan 23, 2021  0762 Initial evaluation concerning for right lower lobe pneumonia.  Add on testing including laboratory; and initiate treatment with IV fluids, narcotic analgesia and empiric  antibiotics.  Moderately high concern for COVID infection [EW]  1148 Case discussed with Dr. Grace Isaac, radiologist.  Patient has PE, acute, with likely pulmonary infarct.  Clot burden is small.  No heart strain. [EW]    Clinical Course User Index [EW] Mancel Bale, MD   MDM Rules/Calculators/A&P                            Patient Vitals for the past 24 hrs:  BP Temp Temp src Pulse Resp SpO2 Height Weight  01/23/21 1157 -- -- -- -- -- -- 5\' 5"  (1.651 m) 78.3 kg  01/23/21 1100 117/74 -- -- (!) 117 (!) 26 100 % -- --  01/23/21 1048 120/82 -- -- (!) 119 (!) 42 100 % -- --  01/23/21 1000 132/85 -- -- (!) 124 (!) 40 (!) 86 % -- --  01/23/21 0945 -- -- -- (!) 127 20 96 % -- --  01/23/21 0921 127/77 (!) 100.8 F (38.2 C) Oral (!) 134 20 94 % -- --  01/23/21 0626 121/81 -- -- (!) 134 16 95 % -- --  01/23/21 0346 118/80 (!) 102.6 F (39.2 C) Oral (!) 136 16 98 % -- --  01/23/21 0021 121/82 100 F (37.8 C) Oral (!) 126 18 99 % -- --    12:07 PM Reevaluation with update and discussion. After initial assessment and treatment, an updated evaluation reveals she is more comfortable at this time.  Findings discussed with patient and husband.  She is agreeable to treatment. 01/25/21   Medical Decision Making:  This patient is presenting for evaluation of painful condition with shortness of breath and fever, which does require a range of treatment options, and is a complaint that involves a high risk of morbidity and mortality. The differential diagnoses include pneumonia, viral illness, PE, complications from eclampsia. I decided  to review old records, and in summary Young adult female, currently breast-feeding, recent pregnancy complicated by severe eclampsia, and has a history of CVA.  She is mildly high risk for multiple problems including COVID infection.  I obtained additional historical information from husband at bedside.  Clinical Laboratory Tests Ordered, included CBC, Metabolic panel,  and venous blood gas, viral panel, lactic acid, blood type D-dimer . Review indicates normal except D-dimer elevated, white count high, albumin low, AST high, alkaline phosphatase high, PO2 on venous blood gas low. Radiologic Tests Ordered, included chest x-ray, CT chest to rule out PE.  I independently Visualized: Radiographic images, which show occlusive thrombus of the right lower lobe pulmonary artery with signs of pulmonary infarction.  No distinct pneumonia appreciated on imaging per radiologist report  Cardiac Monitor Tracing which shows sinus tachycardia   Critical Interventions-clinical evaluation, laboratory testing, IV fluids, radiography, advanced radiography, observation and reassessment  After These Interventions, the Patient was reevaluated and was found improved with initial treatment.  Patient at risk for decompensation with occlusive thrombus of the right lower lobe pulmonary artery.  Etiology of thrombus is not clear.  Patient has not had any extremity symptoms.  She is currently breast-feeding making treatment somewhat complicated because NOACs not indicated.  We will initiate treatment with heparin, and anticipate warfarin treatment.  CRITICAL CARE-yes Performed by: Mancel BaleElliott Grenda Lora  Nursing Notes Reviewed/ Care Coordinated Applicable Imaging Reviewed Interpretation of Laboratory Data incorporated into ED treatment  12:13 PM-Consult complete with hospitalist. Patient case explained and discussed.  She agrees to admit patient for further evaluation and treatment. Call ended at 12:38 PM    Final Clinical Impression(s) / ED Diagnoses Final diagnoses:  Other acute pulmonary embolism without acute cor pulmonale (HCC)  Pulmonary infarction [facility](HCC)    Rx / DC Orders ED Discharge Orders     None        Mancel BaleWentz, Thatcher Doberstein, MD 01/23/21 1243

## 2021-01-23 NOTE — Progress Notes (Signed)
Lower extremity venous has been completed.   Preliminary results in CV Proc.   Blanch Media 01/23/2021 1:03 PM

## 2021-01-23 NOTE — ED Notes (Signed)
Pt returned from CT °

## 2021-01-23 NOTE — Progress Notes (Addendum)
ANTICOAGULATION CONSULT NOTE - Initial Consult  Pharmacy Consult for heparin > lovenox bridging with warfarin Indication: pulmonary embolus  No Known Allergies  Patient Measurements:   Heparin Dosing Weight: 71.8 kg  Vital Signs: Temp: 100.8 F (38.2 C) (07/25 0921) Temp Source: Oral (07/25 0921) BP: 117/74 (07/25 1100) Pulse Rate: 117 (07/25 1100)  Labs: Recent Labs    01/21/21 2320 01/23/21 0947 01/23/21 0954  HGB 13.4 12.0 12.9  HCT 42.4 37.8 38.0  PLT 135* 145*  --   CREATININE 0.66 0.64  --     Estimated Creatinine Clearance: 102.9 mL/min (by C-G formula based on SCr of 0.64 mg/dL).   Medical History: Past Medical History:  Diagnosis Date   Stroke [facility])    Assessment: 30 yo F with R flank pain - reports having pain also in her posterior R chest x 3 days. CTA chest with near occlusive PE in RLL.   Goal of Therapy:  Heparin level 0.3-0.7 units/ml Monitor platelets by anticoagulation protocol: Yes   Plan:  Give 5500 units bolus x 1 Start heparin infusion at 1300 units/hr Check anti-Xa level in 6 hours and daily while on heparin Continue to monitor H&H and platelets F/u plan for oral AC, patient is breastfeeding and likely not a good candidate for a DOAC.  Addendum: new consult to d/c heparin gtt and start therapeutic lovenox for bridging with warfarin. Will give lovenox 80mg  SubQ BID starting now, and warfarin 10mg  x1 PO once. Initial INR 1.1.  F/u INR tomorrow AM.  , PharmD, [facility] Emergency Medicine Clinical Pharmacist ED RPh Phone: [phone] Main RX: [phone]

## 2021-01-23 NOTE — H&P (Addendum)
History and Physical    Andrea Hayes:[id number] DOB: January 27, 1991 DOA: 01/23/2021  PCP: Fleet Contras, MD Consultants:  neurology: Dr. Everlena Cooper  Patient coming from:  Home - lives with husband and new baby.   Chief Complaint: back and chest pain.   HPI: Andrea Hayes is a 30 y.o. female with medical history significant of idiopathic intracranial HTN, right arterial ischemic stroke, MCA, cerebral venous thrombosis who presented to ER with right sided back and chest pain that started on Friday morning and has gotten progressively worse. She has had shortness of breath associated with it as well. NO coughing. No fever/chills, but has had headaches. Pain in her back is a 10/10 and comes and goes. No radiation. States it can be unbearable. No trauma, long flight or car ride, no surgeries. She does not smoke and is not on OCP. She is breast feeding. Post partum x 2 months, birth: 5/6. No recent covid vaccines.   Was on lovenox in pregnancy, but stopped this in May after she delivered.   Per records on her last admit in 10/2020:  Hypercoagulable work-up was unremarkable including  homocystein 4.8, antithrombin III mildly elevated at 128, low protein S activity of 22, total protein S 109, protein C activity 180, total protein C 127, lupus anticoagulant 25.3, DRVVT 44.7, beta-2-glycoprotein antibodies <9, factor V leiden mutation negative, prothrombin gene mutation negative, cardiolipin antibodies negative, sickle cell screen negative   ED Course: vitals: temp: 100.8, bp: 121/83, HR: 126-->88, RR: 15 and oxygen: 97% on room air.  Pertinent labs: AST: 82 and ALT: 98, WBC: 14.8, Platelets: 145, D-dimer: 9.43 CTA: PE near occlusive pulmonary embolism solely involving the right lower lobe pulmonary artery. Asked to admit.   Review of Systems: As per HPI; otherwise review of systems reviewed and negative.   Ambulatory Status:  Ambulates without assistance   Past Medical History:  Diagnosis Date    Stroke [facility])     Past Surgical History:  Procedure Laterality Date   NO PAST SURGERIES      Social History   Socioeconomic History   Marital status: Single    Spouse name: Not on file   Number of children: Not on file   Years of education: Not on file   Highest education level: Not on file  Occupational History   Not on file  Tobacco Use   Smoking status: Never   Smokeless tobacco: Never  Vaping Use   Vaping Use: Never used  Substance and Sexual Activity   Alcohol use: No    Alcohol/week: 0.0 standard drinks   Drug use: No   Sexual activity: Yes    Birth control/protection: None  Other Topics Concern   Not on file  Social History Narrative   Right handed   Social Determinants of Health   Financial Resource Strain: Not on file  Food Insecurity: Not on file  Transportation Needs: Not on file  Physical Activity: Not on file  Stress: Not on file  Social Connections: Not on file  Intimate Partner Violence: Not on file    No Known Allergies  Family History  Problem Relation Age of Onset   Asthma Father    Obesity Neg Hx    Hypertension Neg Hx    Diabetes Neg Hx    Cancer Neg Hx     Prior to Admission medications   Medication Sig Start Date End Date Taking? Authorizing Provider  acetaminophen (TYLENOL) 325 MG tablet Take 2 tablets (650 mg total) by mouth every  6 (six) hours as needed for mild pain or headache (do not take over 3000 mg in a day of tylenol. please note there is tylenol in the fiorecet). Patient not taking: Reported on 11/10/2020 07/29/18   Layne BentonBiby, Sharon L, NP  acetaZOLAMIDE (DIAMOX) 125 MG tablet Take 2 tablets (250 mg total) by mouth 2 (two) times daily. 12/02/20   Everlena CooperJaffe, Adam R, DO  enoxaparin (LOVENOX) 40 MG/0.4ML injection Inject 0.4 mLs (40 mg total) into the skin daily. 12/05/20   Leftwich-Kirby, Wilmer FloorLisa A, CNM  Prenatal Vit-Fe Fumarate-FA (PRENATAL VITAMIN) 27-0.8 MG TABS Take 1 tablet by mouth daily. 04/19/20   Aviva SignsWilliams, Marie L, CNM    Physical  Exam: Vitals:   01/23/21 1200 01/23/21 1215 01/23/21 1230 01/23/21 1245  BP: (!) 115/91 (!) 125/91 (!) 120/91 124/81  Pulse: (!) 111 (!) 109 (!) 110 (!) 107  Resp: (!) 26 (!) 28 (!) 30 (!) 28  Temp:      TempSrc:      SpO2: 100% 100% 99% 100%  Weight:      Height:         General:  Appears calm and comfortable and is in NAD Eyes:  PERRL, EOMI, normal lids, iris ENT:  grossly normal hearing, lips & tongue, mmm; appropriate dentition Neck:  no LAD, masses or thyromegaly; no carotid bruits Cardiovascular:  RRR, no m/r/g. No LE edema.  Respiratory:   CTA bilaterally with no wheezes/rales/rhonchi.  Normal respiratory effort. Abdomen:  soft, NT, ND, NABS Back:   normal alignment, no CVAT. TTP over right mid-lower lung.  Skin:  no rash or induration seen on limited exam Musculoskeletal:  grossly normal tone BUE/BLE, good ROM, no bony abnormality Lower extremity:  No LE edema.  Limited foot exam with no ulcerations.  2+ distal pulses. Psychiatric:  grossly normal mood and affect, speech fluent and appropriate, AOx3 Neurologic:  CN 2-12 grossly intact, moves all extremities in coordinated fashion, sensation intact    Radiological Exams on Admission: Independently reviewed - see discussion in A/P where applicable  DG Chest 2 View  Result Date: 01/23/2021 CLINICAL DATA:  Right upper back/chest pain. EXAM: CHEST - 2 VIEW COMPARISON:  January 27, 2020 FINDINGS: Decreased lung volumes are seen which is likely, in part, secondary to the degree of patient inspiration. Subsequent crowding of the bronchovascular lung markings is seen. Mild right basilar atelectasis and/or infiltrate is present. There is no evidence of a pleural effusion or pneumothorax. The heart size and mediastinal contours are within normal limits. The visualized skeletal structures are unremarkable. IMPRESSION: Low lung volumes with mild right basilar atelectasis and/or infiltrate. Electronically Signed   By: Aram Candelahaddeus  Houston M.D.    On: 01/23/2021 01:58   CT Angio Chest PE W/Cm &/Or Wo Cm  Result Date: 01/23/2021 CLINICAL DATA:  Three months postpartum, now with right upper chest and back pain for the past several days. Evaluate for pulmonary embolism. EXAM: CT ANGIOGRAPHY CHEST WITH CONTRAST TECHNIQUE: Multidetector CT imaging of the chest was performed using the standard protocol during bolus administration of intravenous contrast. Multiplanar CT image reconstructions and MIPs were obtained to evaluate the vascular anatomy. CONTRAST:  100mL OMNIPAQUE IOHEXOL 350 MG/ML SOLN COMPARISON:  Chest radiograph-01/23/2021; noncontrast CT of the abdomen pelvis-01/22/2021 FINDINGS: Vascular Findings: There is adequate opacification of the pulmonary arterial system with the main pulmonary artery measuring 249 Hounsfield units. There is an occlusive filling defect involving the right lower lobe pulmonary artery (image 57, series 5), with associated consolidative opacities within the  right lower lobe favored to represent pulmonary infarction. These findings are associated with a trace presumably reactive right-sided pleural effusion. No additional discrete filling defects are seen within the pulmonary tree to suggest additional sites of pulmonary embolism. Normal caliber of the main pulmonary artery. There is no definitive CT evidence of right-sided heart strain. Borderline cardiomegaly. Trace amount of pericardial fluid, presumably physiologic. No evidence of thoracic aortic aneurysm or dissection on this non gated examination. 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 The branch vessels of the aortic arch appear patent throughout their imaged courses. Review of the MIP images confirms the above findings. ---------------------------------------------------------------------------------- Nonvascular Findings: Mediastinum/Lymph Nodes: No bulky mediastinal, hilar or axillary  lymphadenopathy. Lungs/Pleura: Mixed heterogeneous and consolidative airspace opacities within the right lower lobe, slightly progressed compared to noncontrast CT scan the abdomen pelvis performed day prior, nonspecific though in the setting of pulmonary embolism is favored to represent pulmonary infarction. Trace right-sided pleural effusion, likely reactive in etiology. Punctate nodular right lower lobe opacities are favored to represent areas of atelectasis. Minimal amount of dependent subpleural ground-glass atelectasis within the left lower lobe. The left lung remains well aerated.  No left-sided pleural effusion. Scattered occlusion several right lower lobe distal subsegmental bronchi. The remaining central pulmonary airways appear widely patent. No pneumothorax. Upper abdomen: Limited early arterial phase evaluation of the upper abdomen suggests hepatomegaly. Musculoskeletal: No acute or aggressive osseous abnormalities. Heterogeneous breast parenchyma compatible with postpartum state. Normal appearance of the thyroid gland. IMPRESSION: 1. Examination is positive for near occlusive pulmonary embolism solely involving the right lower lobe pulmonary artery with associated heterogeneous/consolidative right lower lobe opacities, nonspecific though favored to represent pulmonary infarction in the setting of pulmonary embolism. These findings are associated with a trace presumably reactive right-sided pleural effusion. No additional sites of pulmonary embolism are definitively identified. Overall clot burden is deemed small in volume and there is no CT evidence of right-sided heart strain. 2. 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 Critical Value/emergent results were called by telephone at the time of interpretation on 01/23/2021 at 11:43 am to provider [facility] , who verbally acknowledged these results. Electronically Signed   By: Simonne Come M.D.   On: 01/23/2021 11:52   CT Renal Stone Study  Result Date: 01/22/2021 CLINICAL DATA:  Severe right back pain for 2 days EXAM: CT ABDOMEN AND PELVIS WITHOUT CONTRAST TECHNIQUE: Multidetector CT imaging of the abdomen and pelvis was performed following the standard protocol without IV contrast. COMPARISON:  CT 07/13/2017 FINDINGS: Lower chest: Mixed consolidative and ground-glass opacities seen throughout the right lower lobe and minimally in the right middle lobe. Atelectatic changes in the lung bases as well. Normal heart size. No pericardial effusion. Hepatobiliary: No visible focal liver lesion within the limitations of this unenhanced CT. Smooth surface contour. No concerning focal liver lesion. Normal gallbladder and biliary tree. Pancreas: No pancreatic ductal dilatation or surrounding inflammatory changes. Spleen: Normal in size. No concerning splenic lesions. Adrenals/Urinary Tract: Normal adrenals. Kidneys are normally located with symmetric enhancement and excretion. No suspicious renal lesion, urolithiasis or hydronephrosis. Urinary bladder is largely decompressed at the time of exam and therefore poorly evaluated by CT imaging. Mild bladder wall thickening likely related to underdistention. Stomach/Bowel: Distal esophagus, stomach and duodenum are unremarkable. Challenging assessment of the bowel and mesentery in the absence of contrast media and given limited intraperitoneal fat. No conspicuous bowel  wall thickening or dilatation. Normal appendix in the right lower quadrant. Moderate colonic stool burden. No worrisome colonic thickening or dilatation. No evidence of bowel obstruction. Vascular/Lymphatic: No significant vascular findings are present. No enlarged abdominal or pelvic lymph nodes. Reproductive: Slightly retroverted uterus. No concerning adnexal masses. Other: No abdominopelvic free air or fluid. Postsurgical changes of the anterior abdominal wall. No bowel containing hernia.  Small fat containing umbilical hernia. Musculoskeletal: No acute osseous abnormality or suspicious osseous lesion. 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 IMPRESSION: 1. Mixed consolidative and ground-glass opacities throughout the right lower lobe and minimally in the right middle lobe. Could reflect an acute infectious or inflammatory process. 2. No other acute CT abnormality to provide a cause for patient's symptoms. Specifically, no urolithiasis or hydronephrosis. 3. Mild bladder wall thickening may be related to underdistention though could correlate with urinalysis. 4. Moderate colonic stool burden. Correlate for slowed transit/features of constipation. 5. Trace free fluid in the deep pelvis, can be physiologic in a reproductive age female. 6. 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 Electronically Signed   By: Kreg Shropshire M.D.   On: 01/22/2021 00:29   VAS [place] LOWER EXTREMITY VENOUS (DVT) (ONLY [facility] & [facility] 7a-7p)  Result Date: 01/23/2021  Lower Venous DVT Study Patient Name:  VASILIA DISE  Date of Exam:   01/23/2021 Medical Rec #: [id number]       Accession #:    [id number] Date of Birth: 02/07/1991       Patient Gender: F Patient Age:   29Y Exam Location:  [facility] Procedure:      VAS [place] LOWER EXTREMITY VENOUS (DVT) Referring Phys: [id number] Mancel Bale --------------------------------------------------------------------------------  Indications: Pulmonary embolism.  Comparison Study: 11/28/20 prior Performing Technologist: [place] Ponder RVS  Examination Guidelines: A complete evaluation includes B-mode imaging, spectral Doppler, color Doppler, and power Doppler as needed of all accessible portions of each vessel. Bilateral testing is considered an integral part of a complete examination. Limited examinations for reoccurring indications may be performed as noted. The reflux portion of the exam is performed with the patient in reverse Trendelenburg.   +---------+---------------+---------+-----------+----------+--------------+ RIGHT    CompressibilityPhasicitySpontaneityPropertiesThrombus Aging +---------+---------------+---------+-----------+----------+--------------+ CFV      Full           Yes      Yes                                 +---------+---------------+---------+-----------+----------+--------------+ SFJ      Full                                                        +---------+---------------+---------+-----------+----------+--------------+ FV Prox  Full                                                        +---------+---------------+---------+-----------+----------+--------------+ FV Mid   Full                                                        +---------+---------------+---------+-----------+----------+--------------+  FV DistalFull                                                        +---------+---------------+---------+-----------+----------+--------------+ PFV      Full                                                        +---------+---------------+---------+-----------+----------+--------------+ POP      Full           Yes      Yes                                 +---------+---------------+---------+-----------+----------+--------------+ PTV      Full                                                        +---------+---------------+---------+-----------+----------+--------------+ PERO     Full                                                        +---------+---------------+---------+-----------+----------+--------------+   +---------+---------------+---------+-----------+----------+--------------+ LEFT     CompressibilityPhasicitySpontaneityPropertiesThrombus Aging +---------+---------------+---------+-----------+----------+--------------+ CFV      Full           Yes      Yes                                  +---------+---------------+---------+-----------+----------+--------------+ SFJ      Full                                                        +---------+---------------+---------+-----------+----------+--------------+ FV Prox  Full                                                        +---------+---------------+---------+-----------+----------+--------------+ FV Mid   Full                                                        +---------+---------------+---------+-----------+----------+--------------+ FV DistalFull                                                        +---------+---------------+---------+-----------+----------+--------------+  PFV      Full                                                        +---------+---------------+---------+-----------+----------+--------------+ POP      Full           Yes      Yes                                 +---------+---------------+---------+-----------+----------+--------------+ PTV      Full                                                        +---------+---------------+---------+-----------+----------+--------------+ PERO     Full                                                        +---------+---------------+---------+-----------+----------+--------------+     Summary: BILATERAL: - No evidence of deep vein thrombosis seen in the lower extremities, bilaterally. -No evidence of popliteal cyst, bilaterally.   *See table(s) above for measurements and observations.    Preliminary     EKG: Independently reviewed.  Sinus tachycardia with rate 127; nonspecific ST changes with no evidence of acute ischemia   Labs on Admission: I have personally reviewed the available labs and imaging studies at the time of the admission.  Pertinent labs:  AST: 82 and ALT: 98 WBC: 14.8 Platelets: 145 D-dimer: 9.43 CTA: PE near occlusive pulmonary embolism solely involving the right lower lobe pulmonary  artery  Assessment/Plan Principal Problem:   Pulmonary embolism (HCC) -no oxygen requirement and no evidence or right heart strain  -started on heparin drip in ER, but she is breast feeding and will need lovenox bridge to coumadin. Changed her to lovenox with bridge to coumadin per pharmacy.  -has had negative coag w/u recently, but no precipitating factors. Pregnancy over 2+ months ago. May need long term anticoagulation -f/u with heme outpatient if needed.  -continue tele  -troponin negative x 1. 2nd pending.  -echo pending see below.   Active Problems: Chest pain -likely secondary to PE. Does have cardiomegaly on imaging with recent birth -checking echo and troponin.  -on tele   Back pain  -secondary to PE, hard to control -lidocaine patch and will continue with norco, hold tylenol except for what is in the norco with elevated LFTs, and try morphine over dilaudid. Discussed would pump and dump to be safe with baby while on narcotics. Pump given.  -may need adjustment if pain not controlled.     Leukocytosis Likely reactive. Had a mild fever, but likely secondary to PE. Does have blood cultures and urine pending. CTA favors changes from PE over infectious process in lungs. She has no cough or other indication of pneumonia. -if has any more fever would start abx to cover for pneumonia. Asked nurse to page floor doc if fever overnight or if pain not controlled.  Elevated liver enzymes Mildly elevated. Has been taking tylenol. Will continue to monitor and hold tylenol except for what is in her norco.  -repeat in AM.     Chronic right arterial ischemic stroke, MCA (middle cerebral artery) Stable, followed by neurology.     Thrombocytopenia (HCC)  Issue during pregnancy, will continue to monitor.   Idiopathic intracranial HTN -followed by neuro -continue diamox.   Body mass index is 28.73 kg/m.  Please Do MED REC   Level of care: Telemetry Medical DVT prophylaxis:   Lovenox with bridge to coumadin per pharmacy  Code Status:  Full - confirmed with patient Family Communication: husband present at bedside Disposition Plan:  The patient is from: home  Anticipated d/c is to: home   Anticipated d/c date will depend on clinical response to treatment and    anticoagulation. Consults called: none   Admission status:  inpatient    Orland Mustard MD [facility]   How to contact the [facility] Attending or Consulting provider 7A - 7P or covering provider during after hours 7P -7A, for this patient?  Check the care team in [facility] and look for a) attending/consulting [facility] provider listed and b) the [facility] team listed Log into [email or web address] and use [facility] universal password to access. If you do not have the password, please contact the hospital operator. Locate the [facility] provider you are looking for under [facility] and page to a number that you can be directly reached. If you still have difficulty reaching the provider, please page the [facility] (Director on Call) for the Hospitalists listed on amion for assistance.   01/23/2021, 2:25 PM

## 2021-01-23 NOTE — ED Triage Notes (Addendum)
Pt c/o R upper back/chest pain for a few days. Was seen yesterday but left due to wait and having to breastfeed her baby at home. Pt is three months postpartum. Pt denies cough

## 2021-01-23 NOTE — ED Notes (Signed)
Called pt x2 for room, no response. 

## 2021-01-23 NOTE — ED Notes (Signed)
Pt SpO2 dropped to 86% RA. Placed on 3 liters with improvement to 99%. Pt verbalized comfort

## 2021-01-24 ENCOUNTER — Inpatient Hospital Stay (HOSPITAL_COMMUNITY): Payer: 59

## 2021-01-24 DIAGNOSIS — I2699 Other pulmonary embolism without acute cor pulmonale: Principal | ICD-10-CM

## 2021-01-24 DIAGNOSIS — I2609 Other pulmonary embolism with acute cor pulmonale: Secondary | ICD-10-CM | POA: Diagnosis not present

## 2021-01-24 LAB — COMPREHENSIVE METABOLIC PANEL
ALT: 88 U/L — ABNORMAL HIGH (ref 0–44)
AST: 76 U/L — ABNORMAL HIGH (ref 15–41)
Albumin: 2.2 g/dL — ABNORMAL LOW (ref 3.5–5.0)
Alkaline Phosphatase: 154 U/L — ABNORMAL HIGH (ref 38–126)
Anion gap: 7 (ref 5–15)
BUN: 9 mg/dL (ref 6–20)
CO2: 26 mmol/L (ref 22–32)
Calcium: 8.7 mg/dL — ABNORMAL LOW (ref 8.9–10.3)
Chloride: 104 mmol/L (ref 98–111)
Creatinine, Ser: 0.68 mg/dL (ref 0.44–1.00)
GFR, Estimated: >60 mL/min (ref >60)
Glucose, Bld: 118 mg/dL — ABNORMAL HIGH (ref 70–99)
Potassium: 4 mmol/L (ref 3.5–5.1)
Sodium: 137 mmol/L (ref 135–145)
Total Bilirubin: 0.7 mg/dL (ref 0.3–1.2)
Total Protein: 6.7 g/dL (ref 6.5–8.1)

## 2021-01-24 LAB — CBC
HCT: 35.8 % — ABNORMAL LOW (ref 36.0–46.0)
Hemoglobin: 11.2 g/dL — ABNORMAL LOW (ref 12.0–15.0)
MCH: 28.6 pg (ref 26.0–34.0)
MCHC: 31.3 g/dL (ref 30.0–36.0)
MCV: 91.6 fL (ref 80.0–100.0)
Platelets: 154 10*3/uL (ref 150–400)
RBC: 3.91 MIL/uL (ref 3.87–5.11)
RDW: 13 % (ref 11.5–15.5)
WBC: 12.7 10*3/uL — ABNORMAL HIGH (ref 4.0–10.5)
nRBC: 0 % (ref 0.0–0.2)

## 2021-01-24 LAB — ECHOCARDIOGRAM COMPLETE
Area-P 1/2: 5.97 cm2
Calc EF: 63.3 %
Height: 65 in
S' Lateral: 2.5 cm
Single Plane A2C EF: 62.7 %
Single Plane A4C EF: 61.3 %
Weight: 2761.92 oz

## 2021-01-24 LAB — HEPATITIS PANEL, ACUTE
HCV Ab: NONREACTIVE
Hep A IgM: NONREACTIVE
Hep B C IgM: NONREACTIVE
Hepatitis B Surface Ag: NONREACTIVE

## 2021-01-24 LAB — PROCALCITONIN: Procalcitonin: 0.14 ng/mL

## 2021-01-24 LAB — PROTIME-INR
INR: 1.1 (ref 0.8–1.2)
Prothrombin Time: 14.6 seconds (ref 11.4–15.2)

## 2021-01-24 MED ORDER — SODIUM CHLORIDE 0.9 % IV SOLN
500.0000 mg | INTRAVENOUS | Status: DC
  Administered 2021-01-24 – 2021-01-25 (×2): 500 mg via INTRAVENOUS
  Filled 2021-01-24 (×2): qty 500

## 2021-01-24 MED ORDER — SODIUM CHLORIDE 0.9 % IV SOLN
1.0000 g | INTRAVENOUS | Status: DC
  Administered 2021-01-24: 1 g via INTRAVENOUS
  Filled 2021-01-24: qty 1

## 2021-01-24 MED ORDER — SODIUM CHLORIDE 0.9 % IV SOLN: INTRAVENOUS | Status: DC

## 2021-01-24 MED ORDER — SODIUM CHLORIDE 0.9 % IV SOLN
1.0000 g | Freq: Once | INTRAVENOUS | Status: AC
  Administered 2021-01-24: 1 g via INTRAVENOUS
  Filled 2021-01-24: qty 10

## 2021-01-24 MED ORDER — WARFARIN SODIUM 10 MG PO TABS
10.0000 mg | ORAL_TABLET | Freq: Once | ORAL | Status: AC
  Administered 2021-01-24: 10 mg via ORAL
  Filled 2021-01-24: qty 1

## 2021-01-24 MED ORDER — SODIUM CHLORIDE 0.9 % IV BOLUS
500.0000 mL | Freq: Once | INTRAVENOUS | Status: AC
  Administered 2021-01-24: 500 mL via INTRAVENOUS

## 2021-01-24 NOTE — ED Notes (Signed)
Breakfast Order Placed ?

## 2021-01-24 NOTE — ED Notes (Signed)
Attempt to call unit to provide report, nurse unavailable at this time

## 2021-01-24 NOTE — ED Notes (Signed)
Patient transported to us 

## 2021-01-24 NOTE — Progress Notes (Signed)
PROGRESS NOTE    Andrea Hayes  ZOX:[id number] DOB: 04/29/1991 DOA: 01/23/2021 PCP: Fleet Contras, MD   Brief Narrative:  HPI: Andrea Hayes is a 30 y.o. female with medical history significant of idiopathic intracranial HTN, right arterial ischemic stroke, MCA, cerebral venous thrombosis who presented to ER with right sided back and chest pain that started on Friday morning and has gotten progressively worse. She has had shortness of breath associated with it as well. NO coughing. No fever/chills, but has had headaches. Pain in her back is a 10/10 and comes and goes. No radiation. States it can be unbearable. No trauma, long flight or car ride, no surgeries. She does not smoke and is not on OCP. She is breast feeding. Post partum x 2 months, birth: 5/6. No recent covid vaccines.   Was on lovenox in pregnancy, but stopped this in May after she delivered.    Per records on her last admit in 10/2020:  Hypercoagulable work-up was unremarkable including  homocystein 4.8, antithrombin III mildly elevated at 128, low protein S activity of 22, total protein S 109, protein C activity 180, total protein C 127, lupus anticoagulant 25.3, DRVVT 44.7, beta-2-glycoprotein antibodies <9, factor V leiden mutation negative, prothrombin gene mutation negative, cardiolipin antibodies negative, sickle cell screen negative    ED Course: vitals: temp: 100.8, bp: 121/83, HR: 126-->88, RR: 15 and oxygen: 97% on room air. Pertinent labs: AST: 82 and ALT: 98, WBC: 14.8, Platelets: 145, D-dimer: 9.43 CTA: PE near occlusive pulmonary embolism solely involving the right lower lobe pulmonary artery. Asked to admit.   Assessment & Plan:   Principal Problem:   Pulmonary embolism (HCC) Active Problems:   Idiopathic intracranial hypertension   HTN (hypertension)   Chronic right arterial ischemic stroke, MCA (middle cerebral artery)   Leukocytosis   Thrombocytopenia (HCC)   Elevated liver enzymes   Chest pain   Back  pain  Acute hypoxic respiratory failure secondary to near obstructive right-sided PE/pulmonary infarction/chest pain/back pain: Patient has no chest pain.  She complains of right-sided back pain.  She states that her breathing and pain is better although visually, she appears to be slightly dyspneic.  Unable to take deep breath.  Slightly tachycardic and tachypneic.  On examination decreased lung sounds at the base bilaterally.  Consulted IR for possible consideration of thrombectomy/thrombolysis.  Per IR, she is not a candidate for that.  They recommended continuing anticoagulation.  Echo done but results pending.  Monitor closely.  Blood pressure stable.  Coumadin with bridging Lovenox.  DOAC's contraindicated in lactating woman so far due to lack of data available.  Fever: Persistent fever, last known temperature of 101.4 at midnight of 01/23/2021.  Chest x-ray did suspect infiltrates at the right base however clinically, she does not seem to have pneumonia.  Procalcitonin unremarkable.  She does have leukocytosis but that is likely reactive as well.  I think her fever is likely coming from PE.  She was started on Rocephin and Zithromax empirically.  I will continue that.  We will check urine antigen for Legionella and streptococci as well as sputum culture.  Idiopathic intracranial hypertension: Continue Diamox.  Followed by neurology as outpatient.  Elevated LFTs: Improved somewhat.  We will check viral hepatitis panel and ultrasound abdomen.  History of thrombocytopenia: Had this issue during pregnancy.  Currently within normal range.  Closely monitor while she is on anticoagulation.  DVT prophylaxis:    Code Status: Full Code  Family Communication: None present at bedside.  Plan of care discussed with patient in length and he verbalized understanding and agreed with it.  Status is: Inpatient  Remains inpatient appropriate because:Inpatient level of care appropriate due to severity of  illness  Dispo: The patient is from: Home              Anticipated d/c is to: Home              Patient currently is not medically stable to d/c.   Difficult to place patient No        Estimated body mass index is 28.73 kg/m as calculated from the following:   Height as of this encounter: 5\' 5"  (1.651 m).   Weight as of this encounter: 78.3 kg.      Nutritional status:               Consultants:  IR  Procedures:  None  Antimicrobials:  Anti-infectives (From admission, onward)    Start     Dose/Rate Route Frequency Ordered Stop   01/25/21 0000  cefTRIAXone (ROCEPHIN) 1 g in sodium chloride 0.9 % 100 mL IVPB        1 g 200 mL/hr over 30 Minutes Intravenous Every 24 hours 01/24/21 0251     01/24/21 0400  azithromycin (ZITHROMAX) 500 mg in sodium chloride 0.9 % 250 mL IVPB        500 mg 250 mL/hr over 60 Minutes Intravenous Every 24 hours 01/24/21 0251     01/24/21 0300  cefTRIAXone (ROCEPHIN) 1 g in sodium chloride 0.9 % 100 mL IVPB        1 g 200 mL/hr over 30 Minutes Intravenous  Once 01/24/21 0252 01/24/21 [id number]          Subjective: Seen and examined in the ED.  Feels better, breathing and chest pain better.  Visually looks slightly dyspneic.  Complains of right lower back pain.  Objective: Vitals:   01/24/21 0621 01/24/21 0645 01/24/21 0708 01/24/21 1000  BP: 129/86  129/86 118/88  Pulse: (!) 111 (!) 108 (!) 107 (!) 116  Resp: (!) 25 (!) 25 (!) 25 (!) 30  Temp:   98.7 F (37.1 C)   TempSrc:      SpO2: 99% 99% 99% 98%  Weight:      Height:        Intake/Output Summary (Last 24 hours) at 01/24/2021 1305 Last data filed at 01/24/2021 0450 Gross per 24 hour  Intake 850 ml  Output --  Net 850 ml   Filed Weights   01/23/21 1157  Weight: 78.3 kg    Examination:  General exam: Appears slightly dyspneic and tired Respiratory system: Diminished breath sounds bilaterally. Respiratory effort normal. Cardiovascular system: S1 & S2 heard,  sinus tachycardia. No JVD, murmurs, rubs, gallops or clicks. No pedal edema. Gastrointestinal system: Abdomen is nondistended, soft and nontender. No organomegaly or masses felt. Normal bowel sounds heard. Central nervous system: Alert and oriented. No focal neurological deficits. Extremities: Symmetric 5 x 5 power. Skin: No rashes, lesions or ulcers Psychiatry: Judgement and insight appear normal. Mood & affect appropriate.    Data Reviewed: I have personally reviewed following labs and imaging studies  CBC: Recent Labs  Lab 01/21/21 2320 01/23/21 0947 01/23/21 0954 01/24/21 0326  WBC 15.4* 14.8*  --  12.7*  NEUTROABS 12.1* 11.0*  --   --   HGB 13.4 12.0 12.9 11.2*  HCT 42.4 37.8 38.0 35.8*  MCV 89.8 90.0  --  91.6  PLT  135* 145*  --  154   Basic Metabolic Panel: Recent Labs  Lab 01/21/21 2320 01/23/21 0947 01/23/21 0954 01/24/21 0326  NA 137 135 137 137  K 4.2 3.9 4.0 4.0  CL 102 101  --  104  CO2 26 26  --  26  GLUCOSE 107* 117*  --  118*  BUN 6 7  --  9  CREATININE 0.66 0.64  --  0.68  CALCIUM 9.2 9.1  --  8.7*   GFR: Estimated Creatinine Clearance: 106.3 mL/min (by C-G formula based on SCr of 0.68 mg/dL). Liver Function Tests: Recent Labs  Lab 01/21/21 2320 01/23/21 0947 01/24/21 0326  AST 67* 82* 76*  ALT 111* 98* 88*  ALKPHOS 133* 151* 154*  BILITOT 0.7 1.0 0.7  PROT 8.1 7.9 6.7  ALBUMIN 3.4* 2.7* 2.2*   Recent Labs  Lab 01/21/21 2320  LIPASE 24   No results for input(s): AMMONIA in the last 168 hours. Coagulation Profile: Recent Labs  Lab 01/23/21 0947 01/24/21 0326  INR 1.1 1.1   Cardiac Enzymes: No results for input(s): CKTOTAL, CKMB, CKMBINDEX, TROPONINI in the last 168 hours. BNP (last 3 results) No results for input(s): PROBNP in the last 8760 hours. HbA1C: No results for input(s): HGBA1C in the last 72 hours. CBG: No results for input(s): GLUCAP in the last 168 hours. Lipid Profile: No results for input(s): CHOL, HDL, LDLCALC,  TRIG, CHOLHDL, LDLDIRECT in the last 72 hours. Thyroid Function Tests: No results for input(s): TSH, T4TOTAL, FREET4, T3FREE, THYROIDAB in the last 72 hours. Anemia Panel: No results for input(s): VITAMINB12, FOLATE, FERRITIN, TIBC, IRON, RETICCTPCT in the last 72 hours. Sepsis Labs: Recent Labs  Lab 01/23/21 0947 01/24/21 0917  PROCALCITON  --  0.14  LATICACIDVEN 0.7  --     Recent Results (from the past 240 hour(s))  Resp Panel by RT-PCR (Flu A&B, Covid) Nasopharyngeal Swab     Status: None   Collection Time: 01/21/21 11:21 PM   Specimen: Nasopharyngeal Swab; Nasopharyngeal(NP) swabs in vial transport medium  Result Value Ref Range Status   SARS Coronavirus 2 by RT PCR NEGATIVE NEGATIVE Final    Comment: (NOTE) SARS-CoV-2 target nucleic acids are NOT DETECTED.  The SARS-CoV-2 RNA is generally detectable in upper respiratory specimens during the acute phase of infection. The lowest concentration of SARS-CoV-2 viral copies this assay can detect is 138 copies/mL. A negative result does not preclude SARS-Cov-2 infection and should not be used as the sole basis for treatment or other patient management decisions. A negative result may occur with  improper specimen collection/handling, submission of specimen other than nasopharyngeal swab, presence of viral mutation(s) within the areas targeted by this assay, and inadequate number of viral copies(<138 copies/mL). A negative result must be combined with clinical observations, patient history, and epidemiological information. The expected result is Negative.  Fact Sheet for Patients:  BloggerCourse.com  Fact Sheet for Healthcare Providers:  SeriousBroker.it  This test is no t yet approved or cleared by the [place] FDA and  has been authorized for detection and/or diagnosis of SARS-CoV-2 by FDA under an Emergency Use Authorization (EUA). This EUA will remain  in effect  (meaning this test can be used) for the duration of the COVID-19 declaration under Section 564(b)(1) of the Act, 21 U.S.C.section 360bbb-3(b)(1), unless the authorization is terminated  or revoked sooner.       Influenza A by PCR NEGATIVE NEGATIVE Final   Influenza B by PCR NEGATIVE NEGATIVE Final  Comment: (NOTE) The Xpert Xpress SARS-CoV-2/FLU/RSV plus assay is intended as an aid in the diagnosis of influenza from Nasopharyngeal swab specimens and should not be used as a sole basis for treatment. Nasal washings and aspirates are unacceptable for Xpert Xpress SARS-CoV-2/FLU/RSV testing.  Fact Sheet for Patients: BloggerCourse.com  Fact Sheet for Healthcare Providers: SeriousBroker.it  This test is not yet approved or cleared by the [place] FDA and has been authorized for detection and/or diagnosis of SARS-CoV-2 by FDA under an Emergency Use Authorization (EUA). This EUA will remain in effect (meaning this test can be used) for the duration of the COVID-19 declaration under Section 564(b)(1) of the Act, 21 U.S.C. section 360bbb-3(b)(1), unless the authorization is terminated or revoked.  Performed at [facility], [address] [place], [place] [address]   Culture, blood (routine x 2)     Status: None (Preliminary result)   Collection Time: 01/23/21  9:44 AM   Specimen: BLOOD  Result Value Ref Range Status   Specimen Description BLOOD LEFT ANTECUBITAL  Final   Special Requests   Final    BOTTLES DRAWN AEROBIC AND ANAEROBIC Blood Culture results may not be optimal due to an excessive volume of blood received in culture bottles   Culture   Final    NO GROWTH < 24 HOURS Performed at [facility], [address], [place], [place] [address]    Report Status PENDING  Incomplete  Culture, blood (routine x 2)     Status: None (Preliminary result)   Collection Time: 01/23/21  9:47 AM   Specimen: BLOOD   Result Value Ref Range Status   Specimen Description BLOOD SITE NOT SPECIFIED  Final   Special Requests   Final    BOTTLES DRAWN AEROBIC AND ANAEROBIC Blood Culture adequate volume   Culture   Final    NO GROWTH < 24 HOURS Performed at [facility], [address] [place], [place] [address]    Report Status PENDING  Incomplete  Resp Panel by RT-PCR (Flu A&B, Covid) Nasopharyngeal Swab     Status: None   Collection Time: 01/23/21 10:00 AM   Specimen: Nasopharyngeal Swab; Nasopharyngeal(NP) swabs in vial transport medium  Result Value Ref Range Status   SARS Coronavirus 2 by RT PCR NEGATIVE NEGATIVE Final    Comment: (NOTE) SARS-CoV-2 target nucleic acids are NOT DETECTED.  The SARS-CoV-2 RNA is generally detectable in upper respiratory specimens during the acute phase of infection. The lowest concentration of SARS-CoV-2 viral copies this assay can detect is 138 copies/mL. A negative result does not preclude SARS-Cov-2 infection and should not be used as the sole basis for treatment or other patient management decisions. A negative result may occur with  improper specimen collection/handling, submission of specimen other than nasopharyngeal swab, presence of viral mutation(s) within the areas targeted by this assay, and inadequate number of viral copies(<138 copies/mL). A negative result must be combined with clinical observations, patient history, and epidemiological information. The expected result is Negative.  Fact Sheet for Patients:  BloggerCourse.com  Fact Sheet for Healthcare Providers:  SeriousBroker.it  This test is no t yet approved or cleared by the [place] FDA and  has been authorized for detection and/or diagnosis of SARS-CoV-2 by FDA under an Emergency Use Authorization (EUA). This EUA will remain  in effect (meaning this test can be used) for the duration of the COVID-19 declaration under Section  564(b)(1) of the Act, 21 U.S.C.section 360bbb-3(b)(1), unless the authorization  is terminated  or revoked sooner.       Influenza A by PCR NEGATIVE NEGATIVE Final   Influenza B by PCR NEGATIVE NEGATIVE Final    Comment: (NOTE) The Xpert Xpress SARS-CoV-2/FLU/RSV plus assay is intended as an aid in the diagnosis of influenza from Nasopharyngeal swab specimens and should not be used as a sole basis for treatment. Nasal washings and aspirates are unacceptable for Xpert Xpress SARS-CoV-2/FLU/RSV testing.  Fact Sheet for Patients: BloggerCourse.com  Fact Sheet for Healthcare Providers: SeriousBroker.it  This test is not yet approved or cleared by the [place] FDA and has been authorized for detection and/or diagnosis of SARS-CoV-2 by FDA under an Emergency Use Authorization (EUA). This EUA will remain in effect (meaning this test can be used) for the duration of the COVID-19 declaration under Section 564(b)(1) of the Act, 21 U.S.C. section 360bbb-3(b)(1), unless the authorization is terminated or revoked.  Performed at [facility], [address], [place], [place] [address]       Radiology Studies: DG Chest 2 View  Result Date: 01/23/2021 CLINICAL DATA:  Right upper back/chest pain. EXAM: CHEST - 2 VIEW COMPARISON:  January 27, 2020 FINDINGS: Decreased lung volumes are seen which is likely, in part, secondary to the degree of patient inspiration. Subsequent crowding of the bronchovascular lung markings is seen. Mild right basilar atelectasis and/or infiltrate is present. There is no evidence of a pleural effusion or pneumothorax. The heart size and mediastinal contours are within normal limits. The visualized skeletal structures are unremarkable. IMPRESSION: Low lung volumes with mild right basilar atelectasis and/or infiltrate. Electronically Signed   By: Aram Candela M.D.   On: 01/23/2021 01:58   CT Angio Chest PE  W/Cm &/Or Wo Cm  Result Date: 01/23/2021 CLINICAL DATA:  Three months postpartum, now with right upper chest and back pain for the past several days. Evaluate for pulmonary embolism. EXAM: CT ANGIOGRAPHY CHEST WITH CONTRAST TECHNIQUE: Multidetector CT imaging of the chest was performed using the standard protocol during bolus administration of intravenous contrast. Multiplanar CT image reconstructions and MIPs were obtained to evaluate the vascular anatomy. CONTRAST:  OMNIPAQUE IOHEXOL 350 MG/ML SOLN COMPARISON:  Chest radiograph-01/23/2021; noncontrast CT of the abdomen pelvis-01/22/2021 FINDINGS: Vascular Findings: There is adequate opacification of the pulmonary arterial system with the main pulmonary artery measuring 249 Hounsfield units. There is an occlusive filling defect involving the right lower lobe pulmonary artery (image 57, series 5), with associated consolidative opacities within the right lower lobe favored to represent pulmonary infarction. These findings are associated with a trace presumably reactive right-sided pleural effusion. No additional discrete filling defects are seen within the pulmonary tree to suggest additional sites of pulmonary embolism. Normal caliber of the main pulmonary artery. There is no definitive CT evidence of right-sided heart strain. Borderline cardiomegaly. Trace amount of pericardial fluid, presumably physiologic. No evidence of thoracic aortic aneurysm or dissection on this non gated examination. 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 The branch vessels of the aortic arch appear patent throughout their imaged courses. Review of the MIP images confirms the above findings. ---------------------------------------------------------------------------------- Nonvascular Findings: Mediastinum/Lymph Nodes: No bulky mediastinal, hilar or axillary lymphadenopathy. Lungs/Pleura: Mixed  heterogeneous and consolidative airspace opacities within the right lower lobe, slightly progressed compared to noncontrast CT scan the abdomen pelvis performed day prior, nonspecific though in the setting of pulmonary embolism is favored to represent pulmonary infarction. Trace right-sided pleural effusion,  likely reactive in etiology. Punctate nodular right lower lobe opacities are favored to represent areas of atelectasis. Minimal amount of dependent subpleural ground-glass atelectasis within the left lower lobe. The left lung remains well aerated.  No left-sided pleural effusion. Scattered occlusion several right lower lobe distal subsegmental bronchi. The remaining central pulmonary airways appear widely patent. No pneumothorax. Upper abdomen: Limited early arterial phase evaluation of the upper abdomen suggests hepatomegaly. Musculoskeletal: No acute or aggressive osseous abnormalities. Heterogeneous breast parenchyma compatible with postpartum state. Normal appearance of the thyroid gland. IMPRESSION: 1. Examination is positive for near occlusive pulmonary embolism solely involving the right lower lobe pulmonary artery with associated heterogeneous/consolidative right lower lobe opacities, nonspecific though favored to represent pulmonary infarction in the setting of pulmonary embolism. These findings are associated with a trace presumably reactive right-sided pleural effusion. No additional sites of pulmonary embolism are definitively identified. Overall clot burden is deemed small in volume and there is no CT evidence of right-sided heart strain. 2. 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 Critical Value/emergent results were called by telephone at the time of interpretation on 01/23/2021 at 11:43 am to provider [facility] , who verbally acknowledged these results. Electronically Signed   By: Simonne Come M.D.   On: 01/23/2021 11:52    VAS [place] LOWER EXTREMITY VENOUS (DVT) (ONLY [facility] & [facility] 7a-7p)  Result Date: 01/23/2021  Lower Venous DVT Study Patient Name:  Andrea Hayes  Date of Exam:   01/23/2021 Medical Rec #: [id number]       Accession #:    [id number] Date of Birth: 07/07/1990       Patient Gender: F Patient Age:   29Y Exam Location:  [facility] Procedure:      VAS [place] LOWER EXTREMITY VENOUS (DVT) Referring Phys: [id number] Mancel Bale --------------------------------------------------------------------------------  Indications: Pulmonary embolism.  Comparison Study: 11/28/20 prior Performing Technologist: [place] Ponder RVS  Examination Guidelines: A complete evaluation includes B-mode imaging, spectral Doppler, color Doppler, and power Doppler as needed of all accessible portions of each vessel. Bilateral testing is considered an integral part of a complete examination. Limited examinations for reoccurring indications may be performed as noted. The reflux portion of the exam is performed with the patient in reverse Trendelenburg.  +---------+---------------+---------+-----------+----------+--------------+ RIGHT    CompressibilityPhasicitySpontaneityPropertiesThrombus Aging +---------+---------------+---------+-----------+----------+--------------+ CFV      Full           Yes      Yes                                 +---------+---------------+---------+-----------+----------+--------------+ SFJ      Full                                                        +---------+---------------+---------+-----------+----------+--------------+ FV Prox  Full                                                        +---------+---------------+---------+-----------+----------+--------------+ FV Mid   Full                                                        +---------+---------------+---------+-----------+----------+--------------+  FV DistalFull                                                         +---------+---------------+---------+-----------+----------+--------------+ PFV      Full                                                        +---------+---------------+---------+-----------+----------+--------------+ POP      Full           Yes      Yes                                 +---------+---------------+---------+-----------+----------+--------------+ PTV      Full                                                        +---------+---------------+---------+-----------+----------+--------------+ PERO     Full                                                        +---------+---------------+---------+-----------+----------+--------------+   +---------+---------------+---------+-----------+----------+--------------+ LEFT     CompressibilityPhasicitySpontaneityPropertiesThrombus Aging +---------+---------------+---------+-----------+----------+--------------+ CFV      Full           Yes      Yes                                 +---------+---------------+---------+-----------+----------+--------------+ SFJ      Full                                                        +---------+---------------+---------+-----------+----------+--------------+ FV Prox  Full                                                        +---------+---------------+---------+-----------+----------+--------------+ FV Mid   Full                                                        +---------+---------------+---------+-----------+----------+--------------+ FV DistalFull                                                        +---------+---------------+---------+-----------+----------+--------------+  PFV      Full                                                        +---------+---------------+---------+-----------+----------+--------------+ POP      Full           Yes      Yes                                  +---------+---------------+---------+-----------+----------+--------------+ PTV      Full                                                        +---------+---------------+---------+-----------+----------+--------------+ PERO     Full                                                        +---------+---------------+---------+-----------+----------+--------------+     Summary: BILATERAL: - No evidence of deep vein thrombosis seen in the lower extremities, bilaterally. -No evidence of popliteal cyst, bilaterally.   *See table(s) above for measurements and observations. Electronically signed by Fabienne Bruns MD on 01/23/2021 at 4:34:57 PM.    Final     Scheduled Meds:  enoxaparin (LOVENOX) injection  80 mg Subcutaneous BID   lidocaine  1 patch Transdermal Q24H   prenatal multivitamin  1 tablet Oral Q1200   sodium chloride flush  3 mL Intravenous Q12H   warfarin  10 mg Oral ONCE-1600   Warfarin - Pharmacist Dosing Inpatient   Does not apply q1600   Continuous Infusions:  sodium chloride     azithromycin Stopped (01/24/21 0450)   [START ON 01/25/2021] cefTRIAXone (ROCEPHIN)  IV       LOS: 1 day   Time spent: 36 minutes   Hughie Closs, MD [facility]  01/24/2021, 1:05 PM   How to contact the [facility] Attending or Consulting provider 7A - 7P or covering provider during after hours 7P -7A, for this patient?  Check the care team in [facility] and look for a) attending/consulting [facility] provider listed and b) the [facility] team listed. Page or secure chat 7A-7P. Log into [email or web address] and use [facility] universal password to access. If you do not have the password, please contact the hospital operator. Locate the [facility] provider you are looking for under [facility] and page to a number that you can be directly reached. If you still have difficulty reaching the provider, please page the [facility] (Director on Call) for the Hospitalists listed on amion for assistance.

## 2021-01-24 NOTE — Progress Notes (Signed)
ANTICOAGULATION CONSULT NOTE - Follow Up Consult  Pharmacy Consult for Warfarin Indication: pulmonary embolus  No Known Allergies  Patient Measurements: Height: 5\' 5"  (165.1 cm) Weight: 78.3 kg (172 lb 9.9 oz) IBW/kg (Calculated) : 57 Heparin Dosing Weight: 73.4 kg   Vital Signs: Temp: 98.7 F (37.1 C) (07/26 0708) Temp Source: Oral (07/25 2348) BP: 129/86 (07/26 0708) Pulse Rate: 107 (07/26 0708)  Labs: Recent Labs    01/21/21 2320 01/23/21 0947 01/23/21 0954 01/23/21 1540 01/24/21 0326  HGB 13.4 12.0 12.9  --  11.2*  HCT 42.4 37.8 38.0  --  35.8*  PLT 135* 145*  --   --  154  APTT  --  30  --   --   --   LABPROT  --  14.1  --   --  14.6  INR  --  1.1  --   --  1.1  CREATININE 0.66 0.64  --   --  0.68  TROPONINIHS  --   --   --  8  --     Estimated Creatinine Clearance: 106.3 mL/min (by C-G formula based on SCr of 0.68 mg/dL).   Medications:  No AC PTA 7/25 Started on Lovenox bridge to Coumadin for PE  Assessment: Pt presents with PE, currently breastfeeding, so DOACs not recommended. INR naive, score 9 per dosing nomogram  Hg 12.9 >> 11.2, HCT 38.0 >> 35.8, PLT wnl  Goal of Therapy:  INR 2-3 Monitor platelets by anticoagulation protocol: Yes   Plan:  7/25 Lovenox 80 mg subq BID >> 7/25 Warfarin 10 mg x 1 >> 7/26 INR 1.1 >> Warfarin 10 mg x 1 today   F/u INR Monitor H&H trend, PLTs, s/sx bleeding.  8/26, PharmD Candidate 01/24/2021 9:26 AM

## 2021-01-24 NOTE — Progress Notes (Signed)
  Echocardiogram 2D Echocardiogram has been performed.  Augustine Radar 01/24/2021, 10:43 AM

## 2021-01-24 NOTE — Consult Note (Signed)
Chief Complaint: Patient was seen in consultation today for consideration pulmonary thrombolysis vs thrombectomy Chief Complaint  Patient presents with   Back Pain   at the request of Dr Jacqulyn BathPahwani Dr Joretta BachelorA Wolfe   Supervising Physician: Roanna BanningMugweru, Jon  [facility]  History of Present Illness: Andrea Hayes is a 30 y.o. female   Per TRH note:  HX: Idiopathic intracranial HTN, right arterial ischemic stroke, MCA, cerebral venous thrombosis who presented to ER with right sided back and chest pain that started on Friday morning and has gotten progressively worse. She has had shortness of breath associated with it as well. NO coughing. No fever/chills, but has had headaches. Pain in her back is a 10/10 and comes and goes. No radiation. States it can be unbearable. No trauma, long flight or car ride, no surgeries. She does not smoke and is not on OCP. She is breast feeding. Post partum x 2 months, birth: 5/6. No recent covid vaccines. Pt was on Lovenox during pregnancy - but stopped in May  IMPRESSION: 1. Examination is positive for near occlusive pulmonary embolism solely involving the right lower lobe pulmonary artery with associated heterogeneous/consolidative right lower lobe opacities, nonspecific though favored to represent pulmonary infarction in the setting of pulmonary embolism. These findings are associated with a trace presumably reactive right-sided pleural effusion. No additional sites of pulmonary embolism are definitively identified. Overall clot burden is deemed small in volume and there is no CT evidence of right-sided heart strain. 2. 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  P: 116; R: 30; On 2 L 02:  99%  Pt has had Hep bolus yesterday (no longer Heparin - breast feeding) Now on coumadin and Lovenox She states her breathing is easier Still has some moderate Chest pain  Doppler  01/23/21: BILATERAL: - No evidence of deep vein thrombosis seen in the lower extremities, bilaterally. -No evidence of popliteal cyst, bilaterally.    EKG:   Sinus tachycardia with rate 127; nonspecific ST changes with no evidence of acute ischemia  Echo complete- not read  TRH has asked IR to evaluate for PE lysis/thrombectomy  Dr Durwin NoraHennn and Dr Milford CageMugweru have reviewed chat and imaging    Past Medical History:  Diagnosis Date   Stroke [facility](HCC)     Past Surgical History:  Procedure Laterality Date   NO PAST SURGERIES      Allergies: Patient has no known allergies.  Medications: Prior to Admission medications   Medication Sig Start Date End Date Taking? Authorizing Provider  acetaminophen (TYLENOL) 325 MG tablet Take 2 tablets (650 mg total) by mouth every 6 (six) hours as needed for mild pain or headache (do not take over 3000 mg in a day of tylenol. please note there is tylenol in the fiorecet). 07/29/18  Yes Layne BentonBiby, Sharon L, NP  acetaZOLAMIDE (DIAMOX) 125 MG tablet Take 2 tablets (250 mg total) by mouth 2 (two) times daily. 12/02/20  Yes Jaffe, Adam R, DO  enoxaparin (LOVENOX) 40 MG/0.4ML injection Inject 0.4 mLs (40 mg total) into the skin daily. 12/05/20  Yes Leftwich-Kirby, Wilmer FloorLisa A, CNM  Prenatal Vit-Fe Fumarate-FA (PRENATAL VITAMIN) 27-0.8 MG TABS Take 1 tablet by mouth daily. 04/19/20  Yes Aviva SignsWilliams, Marie L, CNM     Family History  Problem Relation Age of Onset   Asthma Father    Obesity Neg Hx    Hypertension Neg Hx    Diabetes Neg Hx    Cancer  Neg Hx     Social History   Socioeconomic History   Marital status: Single    Spouse name: Not on file   Number of children: Not on file   Years of education: Not on file   Highest education level: Not on file  Occupational History   Not on file  Tobacco Use   Smoking status: Never   Smokeless tobacco: Never  Vaping Use   Vaping Use: Never used  Substance and Sexual Activity   Alcohol use: No    Alcohol/week: 0.0 standard  drinks   Drug use: No   Sexual activity: Yes    Birth control/protection: None  Other Topics Concern   Not on file  Social History Narrative   Right handed   Social Determinants of Health   Financial Resource Strain: Not on file  Food Insecurity: Not on file  Transportation Needs: Not on file  Physical Activity: Not on file  Stress: Not on file  Social Connections: Not on file    Review of Systems: A 12 point ROS discussed and pertinent positives are indicated in the HPI above.  All other systems are negative.  Review of Systems  Constitutional:  Positive for activity change. Negative for fatigue and unexpected weight change.  Respiratory:  Positive for shortness of breath. Negative for apnea, cough, wheezing and stridor.   Cardiovascular:  Positive for chest pain.  Gastrointestinal:  Negative for abdominal pain and nausea.  Skin:  Negative for color change.  Neurological:  Negative for dizziness, speech difficulty, weakness, light-headedness and headaches.  Psychiatric/Behavioral:  Negative for behavioral problems and confusion.    Vital Signs: BP 118/88   Pulse (!) 116   Temp 98.7 F (37.1 C)   Resp (!) 30   Ht  (1.651 m)   Wt 172 lb 9.9 oz (78.3 kg)   SpO2 98%   BMI 28.73 kg/m   Physical Exam Vitals reviewed.  Constitutional:      Comments: In NAD  HENT:     Mouth/Throat:     Mouth: Mucous membranes are moist.  Cardiovascular:     Rate and Rhythm: Normal rate and regular rhythm.     Heart sounds: Normal heart sounds.  Pulmonary:     Effort: Pulmonary effort is normal.     Breath sounds: Normal breath sounds.  Abdominal:     Palpations: Abdomen is soft.  Musculoskeletal:        General: No swelling or tenderness. Normal range of motion.     Right lower leg: No edema.     Left lower leg: No edema.  Skin:    General: Skin is warm.  Neurological:     Mental Status: She is alert and oriented to person, place, and time.  Psychiatric:         Behavior: Behavior normal.    Imaging: DG Chest 2 View  Result Date: 01/23/2021 CLINICAL DATA:  Right upper back/chest pain. EXAM: CHEST - 2 VIEW COMPARISON:  January 27, 2020 FINDINGS: Decreased lung volumes are seen which is likely, in part, secondary to the degree of patient inspiration. Subsequent crowding of the bronchovascular lung markings is seen. Mild right basilar atelectasis and/or infiltrate is present. There is no evidence of a pleural effusion or pneumothorax. The heart size and mediastinal contours are within normal limits. The visualized skeletal structures are unremarkable. IMPRESSION: Low lung volumes with mild right basilar atelectasis and/or infiltrate. Electronically Signed   By: Aram Candela M.D.   On:  01/23/2021 01:58   CT Angio Chest PE W/Cm &/Or Wo Cm  Result Date: 01/23/2021 CLINICAL DATA:  Three months postpartum, now with right upper chest and back pain for the past several days. Evaluate for pulmonary embolism. EXAM: CT ANGIOGRAPHY CHEST WITH CONTRAST TECHNIQUE: Multidetector CT imaging of the chest was performed using the standard protocol during bolus administration of intravenous contrast. Multiplanar CT image reconstructions and MIPs were obtained to evaluate the vascular anatomy. CONTRAST:  OMNIPAQUE IOHEXOL 350 MG/ML SOLN COMPARISON:  Chest radiograph-01/23/2021; noncontrast CT of the abdomen pelvis-01/22/2021 FINDINGS: Vascular Findings: There is adequate opacification of the pulmonary arterial system with the main pulmonary artery measuring 249 Hounsfield units. There is an occlusive filling defect involving the right lower lobe pulmonary artery (image 57, series 5), with associated consolidative opacities within the right lower lobe favored to represent pulmonary infarction. These findings are associated with a trace presumably reactive right-sided pleural effusion. No additional discrete filling defects are seen within the pulmonary tree to suggest additional  sites of pulmonary embolism. Normal caliber of the main pulmonary artery. There is no definitive CT evidence of right-sided heart strain. Borderline cardiomegaly. Trace amount of pericardial fluid, presumably physiologic. No evidence of thoracic aortic aneurysm or dissection on this non gated examination. 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 The branch vessels of the aortic arch appear patent throughout their imaged courses. Review of the MIP images confirms the above findings. ---------------------------------------------------------------------------------- Nonvascular Findings: Mediastinum/Lymph Nodes: No bulky mediastinal, hilar or axillary lymphadenopathy. Lungs/Pleura: Mixed heterogeneous and consolidative airspace opacities within the right lower lobe, slightly progressed compared to noncontrast CT scan the abdomen pelvis performed day prior, nonspecific though in the setting of pulmonary embolism is favored to represent pulmonary infarction. Trace right-sided pleural effusion, likely reactive in etiology. Punctate nodular right lower lobe opacities are favored to represent areas of atelectasis. Minimal amount of dependent subpleural ground-glass atelectasis within the left lower lobe. The left lung remains well aerated.  No left-sided pleural effusion. Scattered occlusion several right lower lobe distal subsegmental bronchi. The remaining central pulmonary airways appear widely patent. No pneumothorax. Upper abdomen: Limited early arterial phase evaluation of the upper abdomen suggests hepatomegaly. Musculoskeletal: No acute or aggressive osseous abnormalities. Heterogeneous breast parenchyma compatible with postpartum state. Normal appearance of the thyroid gland. IMPRESSION: 1. Examination is positive for near occlusive pulmonary embolism solely involving the right lower lobe pulmonary artery with associated  heterogeneous/consolidative right lower lobe opacities, nonspecific though favored to represent pulmonary infarction in the setting of pulmonary embolism. These findings are associated with a trace presumably reactive right-sided pleural effusion. No additional sites of pulmonary embolism are definitively identified. Overall clot burden is deemed small in volume and there is no CT evidence of right-sided heart strain. 2. 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 Critical Value/emergent results were called by telephone at the time of interpretation on 01/23/2021 at 11:43 am to provider [facility] , who verbally acknowledged these results. Electronically Signed   By: Simonne Come M.D.   On: 01/23/2021 11:52   CT Renal Stone Study  Result Date: 01/22/2021 CLINICAL DATA:  Severe right back pain for 2 days EXAM: CT ABDOMEN AND PELVIS WITHOUT CONTRAST TECHNIQUE: Multidetector CT imaging of the abdomen and pelvis was performed following the standard protocol without IV contrast. COMPARISON:  CT 07/13/2017 FINDINGS: Lower chest: Mixed consolidative and ground-glass opacities seen throughout the right lower  lobe and minimally in the right middle lobe. Atelectatic changes in the lung bases as well. Normal heart size. No pericardial effusion. Hepatobiliary: No visible focal liver lesion within the limitations of this unenhanced CT. Smooth surface contour. No concerning focal liver lesion. Normal gallbladder and biliary tree. Pancreas: No pancreatic ductal dilatation or surrounding inflammatory changes. Spleen: Normal in size. No concerning splenic lesions. Adrenals/Urinary Tract: Normal adrenals. Kidneys are normally located with symmetric enhancement and excretion. No suspicious renal lesion, urolithiasis or hydronephrosis. Urinary bladder is largely decompressed at the time of exam and therefore poorly evaluated by CT imaging. Mild bladder wall  thickening likely related to underdistention. Stomach/Bowel: Distal esophagus, stomach and duodenum are unremarkable. Challenging assessment of the bowel and mesentery in the absence of contrast media and given limited intraperitoneal fat. No conspicuous bowel wall thickening or dilatation. Normal appendix in the right lower quadrant. Moderate colonic stool burden. No worrisome colonic thickening or dilatation. No evidence of bowel obstruction. Vascular/Lymphatic: No significant vascular findings are present. No enlarged abdominal or pelvic lymph nodes. Reproductive: Slightly retroverted uterus. No concerning adnexal masses. Other: No abdominopelvic free air or fluid. Postsurgical changes of the anterior abdominal wall. No bowel containing hernia. Small fat containing umbilical hernia. Musculoskeletal: No acute osseous abnormality or suspicious osseous lesion. 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 IMPRESSION: 1. Mixed consolidative and ground-glass opacities throughout the right lower lobe and minimally in the right middle lobe. Could reflect an acute infectious or inflammatory process. 2. No other acute CT abnormality to provide a cause for patient's symptoms. Specifically, no urolithiasis or hydronephrosis. 3. Mild bladder wall thickening may be related to underdistention though could correlate with urinalysis. 4. Moderate colonic stool burden. Correlate for slowed transit/features of constipation. 5. Trace free fluid in the deep pelvis, can be physiologic in a reproductive age female. 6. 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 Electronically Signed   By: Kreg Shropshire M.D.   On: 01/22/2021 00:29   VAS [place] LOWER EXTREMITY VENOUS (DVT) (ONLY [facility] & [facility] 7a-7p)  Result Date: 01/23/2021  Lower Venous DVT Study Patient Name:  ANJULIE DIPIERRO  Date of Exam:   01/23/2021 Medical Rec #: [id number]       Accession #:    [id number] Date of Birth: [birth year]       Patient Gender: F Patient Age:    29Y Exam Location:  [facility] Procedure:      VAS [place] LOWER EXTREMITY VENOUS (DVT) Referring Phys: [id number] Mancel Bale --------------------------------------------------------------------------------  Indications: Pulmonary embolism.  Comparison Study: 11/28/20 prior Performing Technologist: [place] Ponder RVS  Examination Guidelines: A complete evaluation includes B-mode imaging, spectral Doppler, color Doppler, and power Doppler as needed of all accessible portions of each vessel. Bilateral testing is considered an integral part of a complete examination. Limited examinations for reoccurring indications may be performed as noted. The reflux portion of the exam is performed with the patient in reverse Trendelenburg.  +---------+---------------+---------+-----------+----------+--------------+ RIGHT    CompressibilityPhasicitySpontaneityPropertiesThrombus Aging +---------+---------------+---------+-----------+----------+--------------+ CFV      Full           Yes      Yes                                 +---------+---------------+---------+-----------+----------+--------------+ SFJ      Full                                                        +---------+---------------+---------+-----------+----------+--------------+  FV Prox  Full                                                        +---------+---------------+---------+-----------+----------+--------------+ FV Mid   Full                                                        +---------+---------------+---------+-----------+----------+--------------+ FV DistalFull                                                        +---------+---------------+---------+-----------+----------+--------------+ PFV      Full                                                        +---------+---------------+---------+-----------+----------+--------------+ POP      Full           Yes      Yes                                  +---------+---------------+---------+-----------+----------+--------------+ PTV      Full                                                        +---------+---------------+---------+-----------+----------+--------------+ PERO     Full                                                        +---------+---------------+---------+-----------+----------+--------------+   +---------+---------------+---------+-----------+----------+--------------+ LEFT     CompressibilityPhasicitySpontaneityPropertiesThrombus Aging +---------+---------------+---------+-----------+----------+--------------+ CFV      Full           Yes      Yes                                 +---------+---------------+---------+-----------+----------+--------------+ SFJ      Full                                                        +---------+---------------+---------+-----------+----------+--------------+ FV Prox  Full                                                        +---------+---------------+---------+-----------+----------+--------------+  FV Mid   Full                                                        +---------+---------------+---------+-----------+----------+--------------+ FV DistalFull                                                        +---------+---------------+---------+-----------+----------+--------------+ PFV      Full                                                        +---------+---------------+---------+-----------+----------+--------------+ POP      Full           Yes      Yes                                 +---------+---------------+---------+-----------+----------+--------------+ PTV      Full                                                        +---------+---------------+---------+-----------+----------+--------------+ PERO     Full                                                         +---------+---------------+---------+-----------+----------+--------------+     Summary: BILATERAL: - No evidence of deep vein thrombosis seen in the lower extremities, bilaterally. -No evidence of popliteal cyst, bilaterally.   *See table(s) above for measurements and observations. Electronically signed by Fabienne Bruns MD on 01/23/2021 at 4:34:57 PM.    Final     Labs:  CBC: Recent Labs    11/28/20 0655 01/21/21 2320 01/23/21 0947 01/23/21 0954 01/24/21 0326  WBC 6.3 15.4* 14.8*  --  12.7*  HGB 12.1 13.4 12.0 12.9 11.2*  HCT 37.9 42.4 37.8 38.0 35.8*  PLT 222 135* 145*  --  154    COAGS: Recent Labs    01/23/21 0947 01/24/21 0326  INR 1.1 1.1  APTT 30  --     BMP: Recent Labs    01/27/20 0737 04/19/20 2042 11/28/20 0655 01/21/21 2320 01/23/21 0947 01/23/21 0954 01/24/21 0326  NA 133*   < > 137 137 135 137 137  K 4.4   < > 3.7 4.2 3.9 4.0 4.0  CL 99   < > 102 102 101  --  104  CO2 26   < > 26 26 26   --  26  GLUCOSE 120*   < > 93 107* 117*  --  118*  BUN 10   < > 10 6 7   --  9  CALCIUM 9.4   < >  8.9 9.2 9.1  --  8.7*  CREATININE 0.59   < > 0.61 0.66 0.64  --  0.68  GFRNONAA >60   < > >60 >60 >60  --  >60  GFRAA >60  --   --   --   --   --   --    < > = values in this interval not displayed.    LIVER FUNCTION TESTS: Recent Labs    11/28/20 0655 01/21/21 2320 01/23/21 0947 01/24/21 0326  BILITOT 0.6 0.7 1.0 0.7  AST 28 67* 82* 76*  ALT 18 111* 98* 88*  ALKPHOS 79 133* 151* 154*  PROT 6.5 8.1 7.9 6.7  ALBUMIN 2.7* 3.4* 2.7* 2.2*    TUMOR MARKERS: No results for input(s): AFPTM, CEA, CA199, CHROMGRNA in the last 8760 hours.  Assessment and Plan:  Hx Idiopathic intracranial HTN, right arterial ischemic stroke, MCA, cerebral venous thrombosis who presented to ER with right sided back and chest pain that started on Friday morning and has gotten progressively worse. She has had shortness of breath associated with it as well. NO coughing. No fever/chills,  but has had headaches. Pain in her back is a 10/10 and comes and goes. No radiation. States it can be unbearable. No trauma, long flight or car ride, no surgeries. She does not smoke and is not on OCP. She is breast feeding. Post partum x 2 months, birth: 5/6. No recent covid vaccines. Pt was on Lovenox during pregnancy - but stopped in May  CTA showing small clot burden; No Rt heart strain Pt breathing easier after Hep bolus and Lovenox bridge- Coumadin Doppler neg; EKG wnl Echo- pending result  Discussed with Dr Milford Cage and Dr Lowella Dandy Feel pt not candidate for PE thrombolysis/thrombectomy at this time. Rec: continue anticoagulation Likely to continue to improve No needs from IR If symptoms change or worsen-- consider CCM consult Re consult IR if deemed necessary..  Thank you  Thank you for this interesting consult.  I greatly enjoyed meeting Lorel Locastro and look forward to participating in their care.  A copy of this report was sent to the requesting provider on this date.  Electronically Signed: Robet Leu, PA-C 01/24/2021, 11:39 AM   I spent a total of 40 Minutes    in face to face in clinical consultation, greater than 50% of which was counseling/coordinating care for consideration of PE thrombolysis/thrombectomy

## 2021-01-24 NOTE — ED Notes (Addendum)
Md paged regarding pts temperature and heart rate. Awaiting call back.

## 2021-01-24 NOTE — ED Notes (Signed)
Pt placed on 3L [place] due to O2 saturations dropping into low 80's

## 2021-01-24 NOTE — ED Notes (Signed)
Pt returned from u/s, pumping and discarding milk

## 2021-01-25 DIAGNOSIS — I2699 Other pulmonary embolism without acute cor pulmonale: Secondary | ICD-10-CM | POA: Diagnosis not present

## 2021-01-25 LAB — URINE CULTURE

## 2021-01-25 LAB — CBC WITH DIFFERENTIAL/PLATELET
Abs Immature Granulocytes: 0.06 10*3/uL (ref 0.00–0.07)
Basophils Absolute: 0.1 10*3/uL (ref 0.0–0.1)
Basophils Relative: 1 %
Eosinophils Absolute: 0.3 10*3/uL (ref 0.0–0.5)
Eosinophils Relative: 3 %
HCT: 38.3 % (ref 36.0–46.0)
Hemoglobin: 11.8 g/dL — ABNORMAL LOW (ref 12.0–15.0)
Immature Granulocytes: 1 %
Lymphocytes Relative: 23 %
Lymphs Abs: 2.1 10*3/uL (ref 0.7–4.0)
MCH: 28 pg (ref 26.0–34.0)
MCHC: 30.8 g/dL (ref 30.0–36.0)
MCV: 91 fL (ref 80.0–100.0)
Monocytes Absolute: 0.9 10*3/uL (ref 0.1–1.0)
Monocytes Relative: 10 %
Neutro Abs: 5.9 10*3/uL (ref 1.7–7.7)
Neutrophils Relative %: 62 %
Platelets: 190 10*3/uL (ref 150–400)
RBC: 4.21 MIL/uL (ref 3.87–5.11)
RDW: 12.9 % (ref 11.5–15.5)
WBC: 9.4 10*3/uL (ref 4.0–10.5)
nRBC: 0 % (ref 0.0–0.2)

## 2021-01-25 LAB — COMPREHENSIVE METABOLIC PANEL
ALT: 127 U/L — ABNORMAL HIGH (ref 0–44)
AST: 145 U/L — ABNORMAL HIGH (ref 15–41)
Albumin: 2.5 g/dL — ABNORMAL LOW (ref 3.5–5.0)
Alkaline Phosphatase: 253 U/L — ABNORMAL HIGH (ref 38–126)
Anion gap: 10 (ref 5–15)
BUN: 9 mg/dL (ref 6–20)
CO2: 28 mmol/L (ref 22–32)
Calcium: 9.5 mg/dL (ref 8.9–10.3)
Chloride: 99 mmol/L (ref 98–111)
Creatinine, Ser: 0.65 mg/dL (ref 0.44–1.00)
GFR, Estimated: >60 mL/min (ref >60)
Glucose, Bld: 94 mg/dL (ref 70–99)
Potassium: 4 mmol/L (ref 3.5–5.1)
Sodium: 137 mmol/L (ref 135–145)
Total Bilirubin: 0.6 mg/dL (ref 0.3–1.2)
Total Protein: 8 g/dL (ref 6.5–8.1)

## 2021-01-25 LAB — PROTIME-INR
INR: 1.2 (ref 0.8–1.2)
Prothrombin Time: 15 seconds (ref 11.4–15.2)

## 2021-01-25 MED ORDER — WARFARIN SODIUM 7.5 MG PO TABS
12.5000 mg | ORAL_TABLET | Freq: Once | ORAL | Status: AC
  Administered 2021-01-25: 12.5 mg via ORAL
  Filled 2021-01-25: qty 1

## 2021-01-25 NOTE — Progress Notes (Signed)
PROGRESS NOTE    [name]  KKX:[id number] DOB: 07/06/1990 DOA: 01/23/2021 PCP: Fleet Contras, MD   Brief Narrative:  Andrea Hayes is a 30 y.o. female with medical history significant of idiopathic intracranial HTN, right arterial ischemic stroke, MCA, cerebral venous thrombosis who presented to ER with right sided back and chest pain and shortness of breath.  No other complaint.  No sick contact, trauma or recent travel.  She is postpartum x2 months and currently breast-feeding. Was on lovenox in pregnancy, but stopped this in May after she delivered.    Per records on her last admit in 10/2020:  Hypercoagulable work-up was unremarkable including  homocystein 4.8, antithrombin III mildly elevated at 128, low protein S activity of 22, total protein S 109, protein C activity 180, total protein C 127, lupus anticoagulant 25.3, DRVVT 44.7, beta-2-glycoprotein antibodies <9, factor V leiden mutation negative, prothrombin gene mutation negative, cardiolipin antibodies negative, sickle cell screen negative    Upon arrival to ED, her vitals: temp: 100.8, bp: 121/83, HR: 126-->88, RR: 15 and oxygen: 97% on room air.  She had elevated LFTs.  CT PE showed near occlusive right-sided PE with pulmonary infarction.  Started on Lovenox and Coumadin and admitted to hospital service.  Evaluated by IR, deemed not a candidate for thrombolysis or thrombectomy.  Continued to have fever, started on empiric antibiotics for possible pneumonia.  Assessment & Plan:   Principal Problem:   Pulmonary embolism (HCC) Active Problems:   Idiopathic intracranial hypertension   HTN (hypertension)   Chronic right arterial ischemic stroke, MCA (middle cerebral artery)   Leukocytosis   Thrombocytopenia (HCC)   Elevated liver enzymes   Chest pain   Back pain  Acute hypoxic respiratory failure secondary to near obstructive right-sided PE/pulmonary infarction/chest pain/back pain: She still has some back pain.  No chest  pain.  No shortness of breath.  Not hypoxic anymore.  INR subtherapeutic.  Continue Coumadin with bridging Lovenox.  Patient tells me that she was injecting Lovenox during her pregnancy so she is very well educated on how to do that and feels confident.  Fever: Persistent fever, last known temperature of 101.4 at midnight of 01/23/2021.  Chest x-ray did suspect infiltrates at the right base however clinically, she does not seem to have pneumonia.  Procalcitonin unremarkable.  Leukocytosis resolved.  She is now afebrile.  I think her fever was likely secondary to PE.  Will discontinue antibiotics and monitor for 24 hours.  Urine antigen for Legionella and streptococci are still pending.  Idiopathic intracranial hypertension: Continue Diamox.  Followed by neurology as outpatient.  Elevated LFTs/IVC thrombosis: Ruled out of acute viral hepatitis panel.  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  LFTs are further elevated, likely due to hepatic congestion due to IVC thrombosis.  Repeat labs in the morning.  Although she has no pain but would like to make sure that her LFTs are at least stable before we discharge her.  History of thrombocytopenia: Had this issue during pregnancy.  Currently within normal range.  Closely monitor while she is on anticoagulation.  DVT prophylaxis:    Code Status: Full Code  Family Communication: None present at bedside.  Plan of care discussed with patient in length and he verbalized understanding and agreed with it.  Status is: Inpatient  Remains inpatient appropriate because:Inpatient level of care appropriate due to severity of illness  Dispo: The patient is from: Home  Anticipated d/c is to: Home              Patient currently is not medically stable to d/c.   Difficult to place patient No        Estimated body mass index is 28.73 kg/m as calculated from the following:   Height as of this encounter:  (1.651 m).   Weight  as of this encounter: 78.3 kg.      Nutritional status:               Consultants:  IR  Procedures:  None  Antimicrobials:  Anti-infectives (From admission, onward)    Start     Dose/Rate Route Frequency Ordered Stop   01/25/21 0000  cefTRIAXone (ROCEPHIN) 1 g in sodium chloride 0.9 % 100 mL IVPB  Status:  Discontinued        1 g 200 mL/hr over 30 Minutes Intravenous Every 24 hours 01/24/21 0251 01/25/21 0932   01/24/21 0400  azithromycin (ZITHROMAX) 500 mg in sodium chloride 0.9 % 250 mL IVPB  Status:  Discontinued        500 mg 250 mL/hr over 60 Minutes Intravenous Every 24 hours 01/24/21 0251 01/25/21 0932   01/24/21 0300  cefTRIAXone (ROCEPHIN) 1 g in sodium chloride 0.9 % 100 mL IVPB        1 g 200 mL/hr over 30 Minutes Intravenous  Once 01/24/21 0252 01/24/21 0337          Subjective: Seen and examined.  No complaints other than back pain.  No shortness of breath.  Very eager to go home as she is missing her newborn baby.  I explained to her that she is not medically ready for discharge yet due to significant jump in LFTs.  Objective: Vitals:   01/24/21 1649 01/24/21 1756 01/24/21 2016 01/25/21 0326  BP: 132/88 (!) 129/91 (!) 125/94 (!) 122/92  Pulse: (!) 111 (!) 113 (!) 112 (!) 110  Resp: Temp: 98.7 F (37.1 C) 98.7 F (37.1 C) 98.3 F (36.8 C) 98 F (36.7 C)  TempSrc:      SpO2:  97% 97% 97%  Weight:      Height:        Intake/Output Summary (Last 24 hours) at 01/25/2021 1123 Last data filed at 01/25/2021 0301 Gross per 24 hour  Intake 100 ml  Output --  Net 100 ml    Filed Weights   01/23/21 1157  Weight: 78.3 kg    Examination:  General exam: Appears calm and comfortable  Respiratory system: Clear to auscultation. Respiratory effort normal. Cardiovascular system: S1 & S2 heard, RRR. No JVD, murmurs, rubs, gallops or clicks. No pedal edema. Gastrointestinal system: Abdomen is nondistended, soft and nontender. No  organomegaly or masses felt. Normal bowel sounds heard. Central nervous system: Alert and oriented. No focal neurological deficits. Extremities: Symmetric 5 x 5 power. Skin: No rashes, lesions or ulcers.  Psychiatry: Judgement and insight appear normal. Mood & affect appropriate.    Data Reviewed: I have personally reviewed following labs and imaging studies  CBC: Recent Labs  Lab 01/21/21 2320 01/23/21 0947 01/23/21 0954 01/24/21 0326 01/25/21 0138  WBC 15.4* 14.8*  --  12.7* 9.4  NEUTROABS 12.1* 11.0*  --   --  5.9  HGB 13.4 12.0 12.9 11.2* 11.8*  HCT 42.4 37.8 38.0 35.8* 38.3  MCV 89.8 90.0  --  91.6 91.0  PLT 135* 145*  --  154 190  Basic Metabolic Panel: Recent Labs  Lab 01/21/21 2320 01/23/21 0947 01/23/21 0954 01/24/21 0326 01/25/21 0138  NA 137 135 137 137 137  K 4.2 3.9 4.0 4.0 4.0  CL 102 101  --  104 99  CO2 26 26  --  26 28  GLUCOSE 107* 117*  --  118* 94  BUN 6 7  --  9 9  CREATININE 0.66 0.64  --  0.68 0.65  CALCIUM 9.2 9.1  --  8.7* 9.5    GFR: Estimated Creatinine Clearance: 106.3 mL/min (by C-G formula based on SCr of 0.65 mg/dL). Liver Function Tests: Recent Labs  Lab 01/21/21 2320 01/23/21 0947 01/24/21 0326 01/25/21 0138  AST 67* 82* 76* 145*  ALT 111* 98* 88* 127*  ALKPHOS 133* 151* 154* 253*  BILITOT 0.7 1.0 0.7 0.6  PROT 8.1 7.9 6.7 8.0  ALBUMIN 3.4* 2.7* 2.2* 2.5*    Recent Labs  Lab 01/21/21 2320  LIPASE 24    No results for input(s): AMMONIA in the last 168 hours. Coagulation Profile: Recent Labs  Lab 01/23/21 0947 01/24/21 0326 01/25/21 0138  INR 1.1 1.1 1.2    Cardiac Enzymes: No results for input(s): CKTOTAL, CKMB, CKMBINDEX, TROPONINI in the last 168 hours. BNP (last 3 results) No results for input(s): PROBNP in the last 8760 hours. HbA1C: No results for input(s): HGBA1C in the last 72 hours. CBG: No results for input(s): GLUCAP in the last 168 hours. Lipid Profile: No results for input(s): CHOL, HDL,  LDLCALC, TRIG, CHOLHDL, LDLDIRECT in the last 72 hours. Thyroid Function Tests: No results for input(s): TSH, T4TOTAL, FREET4, T3FREE, THYROIDAB in the last 72 hours. Anemia Panel: No results for input(s): VITAMINB12, FOLATE, FERRITIN, TIBC, IRON, RETICCTPCT in the last 72 hours. Sepsis Labs: Recent Labs  Lab 01/23/21 0947 01/24/21 0917  PROCALCITON  --  0.14  LATICACIDVEN 0.7  --      Recent Results (from the past 240 hour(s))  Resp Panel by RT-PCR (Flu A&B, Covid) Nasopharyngeal Swab     Status: None   Collection Time: 01/21/21 11:21 PM   Specimen: Nasopharyngeal Swab; Nasopharyngeal(NP) swabs in vial transport medium  Result Value Ref Range Status   SARS Coronavirus 2 by RT PCR NEGATIVE NEGATIVE Final    Comment: (NOTE) SARS-CoV-2 target nucleic acids are NOT DETECTED.  The SARS-CoV-2 RNA is generally detectable in upper respiratory specimens during the acute phase of infection. The lowest concentration of SARS-CoV-2 viral copies this assay can detect is 138 copies/mL. A negative result does not preclude SARS-Cov-2 infection and should not be used as the sole basis for treatment or other patient management decisions. A negative result may occur with  improper specimen collection/handling, submission of specimen other than nasopharyngeal swab, presence of viral mutation(s) within the areas targeted by this assay, and inadequate number of viral copies(<138 copies/mL). A negative result must be combined with clinical observations, patient history, and epidemiological information. The expected result is Negative.  Fact Sheet for Patients:  BloggerCourse.comhttps://www.fda.gov/media/152166/download  Fact Sheet for Healthcare Providers:  SeriousBroker.ithttps://www.fda.gov/media/152162/download  This test is no t yet approved or cleared by the [place] FDA and  has been authorized for detection and/or diagnosis of SARS-CoV-2 by FDA under an Emergency Use Authorization (EUA). This EUA will remain  in  effect (meaning this test can be used) for the duration of the COVID-19 declaration under Section 564(b)(1) of the Act, 21 U.S.C.section 360bbb-3(b)(1), unless the authorization is terminated  or revoked sooner.  Influenza A by PCR NEGATIVE NEGATIVE Final   Influenza B by PCR NEGATIVE NEGATIVE Final    Comment: (NOTE) The Xpert Xpress SARS-CoV-2/FLU/RSV plus assay is intended as an aid in the diagnosis of influenza from Nasopharyngeal swab specimens and should not be used as a sole basis for treatment. Nasal washings and aspirates are unacceptable for Xpert Xpress SARS-CoV-2/FLU/RSV testing.  Fact Sheet for Patients: BloggerCourse.com  Fact Sheet for Healthcare Providers: SeriousBroker.it  This test is not yet approved or cleared by the [place] FDA and has been authorized for detection and/or diagnosis of SARS-CoV-2 by FDA under an Emergency Use Authorization (EUA). This EUA will remain in effect (meaning this test can be used) for the duration of the COVID-19 declaration under Section 564(b)(1) of the Act, 21 U.S.C. section 360bbb-3(b)(1), unless the authorization is terminated or revoked.  Performed at [facility], [address] [place], [place] [address]   Culture, blood (routine x 2)     Status: None (Preliminary result)   Collection Time: 01/23/21  9:44 AM   Specimen: BLOOD  Result Value Ref Range Status   Specimen Description BLOOD LEFT ANTECUBITAL  Final   Special Requests   Final    BOTTLES DRAWN AEROBIC AND ANAEROBIC Blood Culture results may not be optimal due to an excessive volume of blood received in culture bottles   Culture   Final    NO GROWTH < 24 HOURS Performed at [facility], [address], [place], [place] [address]    Report Status PENDING  Incomplete  Culture, blood (routine x 2)     Status: None (Preliminary result)   Collection Time: 01/23/21  9:47 AM   Specimen: BLOOD   Result Value Ref Range Status   Specimen Description BLOOD SITE NOT SPECIFIED  Final   Special Requests   Final    BOTTLES DRAWN AEROBIC AND ANAEROBIC Blood Culture adequate volume   Culture   Final    NO GROWTH < 24 HOURS Performed at [facility], [address] [place], [place] [address]    Report Status PENDING  Incomplete  Resp Panel by RT-PCR (Flu A&B, Covid) Nasopharyngeal Swab     Status: None   Collection Time: 01/23/21 10:00 AM   Specimen: Nasopharyngeal Swab; Nasopharyngeal(NP) swabs in vial transport medium  Result Value Ref Range Status   SARS Coronavirus 2 by RT PCR NEGATIVE NEGATIVE Final    Comment: (NOTE) SARS-CoV-2 target nucleic acids are NOT DETECTED.  The SARS-CoV-2 RNA is generally detectable in upper respiratory specimens during the acute phase of infection. The lowest concentration of SARS-CoV-2 viral copies this assay can detect is 138 copies/mL. A negative result does not preclude SARS-Cov-2 infection and should not be used as the sole basis for treatment or other patient management decisions. A negative result may occur with  improper specimen collection/handling, submission of specimen other than nasopharyngeal swab, presence of viral mutation(s) within the areas targeted by this assay, and inadequate number of viral copies(<138 copies/mL). A negative result must be combined with clinical observations, patient history, and epidemiological information. The expected result is Negative.  Fact Sheet for Patients:  BloggerCourse.com  Fact Sheet for Healthcare Providers:  SeriousBroker.it  This test is no t yet approved or cleared by the [place] FDA and  has been authorized for detection and/or diagnosis of SARS-CoV-2 by FDA under an Emergency Use Authorization (EUA). This EUA will remain  in effect (meaning this test can be used) for  the duration of the COVID-19 declaration under Section  564(b)(1) of the Act, 21 U.S.C.section 360bbb-3(b)(1), unless the authorization is terminated  or revoked sooner.       Influenza A by PCR NEGATIVE NEGATIVE Final   Influenza B by PCR NEGATIVE NEGATIVE Final    Comment: (NOTE) The Xpert Xpress SARS-CoV-2/FLU/RSV plus assay is intended as an aid in the diagnosis of influenza from Nasopharyngeal swab specimens and should not be used as a sole basis for treatment. Nasal washings and aspirates are unacceptable for Xpert Xpress SARS-CoV-2/FLU/RSV testing.  Fact Sheet for Patients: BloggerCourse.com  Fact Sheet for Healthcare Providers: SeriousBroker.it  This test is not yet approved or cleared by the [place] FDA and has been authorized for detection and/or diagnosis of SARS-CoV-2 by FDA under an Emergency Use Authorization (EUA). This EUA will remain in effect (meaning this test can be used) for the duration of the COVID-19 declaration under Section 564(b)(1) of the Act, 21 U.S.C. section 360bbb-3(b)(1), unless the authorization is terminated or revoked.  Performed at [facility], [address] [place], [place] [address]   Urine Culture     Status: Abnormal   Collection Time: 01/23/21  9:23 PM   Specimen: Urine, Clean Catch  Result Value Ref Range Status   Specimen Description URINE, CLEAN CATCH  Final   Special Requests   Final    NONE Performed at [facility], [address], [place], [place] [address]    Culture MULTIPLE SPECIES PRESENT, SUGGEST RECOLLECTION (A)  Final   Report Status 01/25/2021 FINAL  Final       Radiology Studies: CT Angio Chest PE W/Cm &/Or Wo Cm  Result Date: 01/23/2021 CLINICAL DATA:  Three months postpartum, now with right upper chest and back pain for the past several days. Evaluate for pulmonary embolism. EXAM: CT ANGIOGRAPHY CHEST WITH CONTRAST TECHNIQUE: Multidetector CT imaging of the chest was performed using the  standard protocol during bolus administration of intravenous contrast. Multiplanar CT image reconstructions and MIPs were obtained to evaluate the vascular anatomy. CONTRAST:  OMNIPAQUE IOHEXOL 350 MG/ML SOLN COMPARISON:  Chest radiograph-01/23/2021; noncontrast CT of the abdomen pelvis-01/22/2021 FINDINGS: Vascular Findings: There is adequate opacification of the pulmonary arterial system with the main pulmonary artery measuring 249 Hounsfield units. There is an occlusive filling defect involving the right lower lobe pulmonary artery (image 57, series 5), with associated consolidative opacities within the right lower lobe favored to represent pulmonary infarction. These findings are associated with a trace presumably reactive right-sided pleural effusion. No additional discrete filling defects are seen within the pulmonary tree to suggest additional sites of pulmonary embolism. Normal caliber of the main pulmonary artery. There is no definitive CT evidence of right-sided heart strain. Borderline cardiomegaly. Trace amount of pericardial fluid, presumably physiologic. No evidence of thoracic aortic aneurysm or dissection on this non gated examination. 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 The branch vessels of the aortic arch appear patent throughout their imaged courses. Review of the MIP images confirms the above findings. ---------------------------------------------------------------------------------- Nonvascular Findings: Mediastinum/Lymph Nodes: No bulky mediastinal, hilar or axillary lymphadenopathy. Lungs/Pleura: Mixed heterogeneous and consolidative airspace opacities within the right lower lobe, slightly progressed compared to noncontrast CT scan the abdomen pelvis performed day prior, nonspecific though in the setting of pulmonary embolism is favored to represent pulmonary infarction. Trace right-sided pleural effusion,  likely reactive in etiology. Punctate nodular right lower lobe opacities are  favored to represent areas of atelectasis. Minimal amount of dependent subpleural ground-glass atelectasis within the left lower lobe. The left lung remains well aerated.  No left-sided pleural effusion. Scattered occlusion several right lower lobe distal subsegmental bronchi. The remaining central pulmonary airways appear widely patent. No pneumothorax. Upper abdomen: Limited early arterial phase evaluation of the upper abdomen suggests hepatomegaly. Musculoskeletal: No acute or aggressive osseous abnormalities. Heterogeneous breast parenchyma compatible with postpartum state. Normal appearance of the thyroid gland. IMPRESSION: 1. Examination is positive for near occlusive pulmonary embolism solely involving the right lower lobe pulmonary artery with associated heterogeneous/consolidative right lower lobe opacities, nonspecific though favored to represent pulmonary infarction in the setting of pulmonary embolism. These findings are associated with a trace presumably reactive right-sided pleural effusion. No additional sites of pulmonary embolism are definitively identified. Overall clot burden is deemed small in volume and there is no CT evidence of right-sided heart strain. 2. 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 Critical Value/emergent results were called by telephone at the time of interpretation on 01/23/2021 at 11:43 am to provider [facility] , who verbally acknowledged these results. Electronically Signed   By: Simonne Come M.D.   On: 01/23/2021 11:52   ECHOCARDIOGRAM COMPLETE  Result Date: 01/24/2021    ECHOCARDIOGRAM REPORT   Patient Name:   LUCERO AUZENNE Date of Exam: 01/24/2021 Medical Rec #:  [id number]      Height:       65.0 in Accession #:    [id number]     Weight:       172.6 lb Date of Birth:  1990/10/24      BSA:          1.858 m Patient Age:     29 years       BP:           129/86 mmHg Patient Gender: F              HR:           115 bpm. Exam Location:  Inpatient Procedure: 2D Echo, Cardiac Doppler and Color Doppler Indications:   Pulmonary Embolus I26.09  History:       Patient has no prior history of Echocardiogram examinations.                Stroke.  Sonographer:   Eulah Pont RDCS Referring      [phone] Orland Mustard Phys: IMPRESSIONS  1. Left ventricular ejection fraction, by estimation, is 60 to 65%. The left ventricle has normal function. The left ventricle has no regional wall motion abnormalities. Left ventricular diastolic parameters are indeterminate.  2. Right ventricular systolic function is normal. The right ventricular size is normal.  3. Right atrial size was mildly dilated.  4. The mitral valve is normal in structure. Trivial mitral valve regurgitation. No evidence of mitral stenosis.  5. The aortic valve is tricuspid. Aortic valve regurgitation is not visualized. No aortic stenosis is present.  6. The inferior vena cava is normal in size with greater than 50% respiratory variability, suggesting right atrial pressure of 3 mmHg. FINDINGS  Left Ventricle: Left ventricular ejection fraction, by estimation, is 60 to 65%. The left ventricle has normal function. The left ventricle has no regional wall motion abnormalities. The left ventricular internal cavity size was normal in size. There is  no left ventricular hypertrophy. Left ventricular diastolic parameters are indeterminate. Right Ventricle: The right ventricular size is normal. No increase in  right ventricular wall thickness. Right ventricular systolic function is normal. Left Atrium: Left atrial size was normal in size. Right Atrium: Right atrial size was mildly dilated. Pericardium: There is no evidence of pericardial effusion. Mitral Valve: The mitral valve is normal in structure. Trivial mitral valve regurgitation. No evidence of mitral valve stenosis. Tricuspid Valve: The  tricuspid valve is normal in structure. Tricuspid valve regurgitation is not demonstrated. No evidence of tricuspid stenosis. Aortic Valve: The aortic valve is tricuspid. Aortic valve regurgitation is not visualized. No aortic stenosis is present. Pulmonic Valve: The pulmonic valve was normal in structure. Pulmonic valve regurgitation is not visualized. No evidence of pulmonic stenosis. Aorta: The aortic root is normal in size and structure. Venous: The inferior vena cava is normal in size with greater than 50% respiratory variability, suggesting right atrial pressure of 3 mmHg. IAS/Shunts: No atrial level shunt detected by color flow Doppler.  LEFT VENTRICLE PLAX 2D LVIDd:         4.00 cm     Diastology LVIDs:         2.50 cm     LV e' medial:    14.30 cm/s LV PW:         0.90 cm     LV E/e' medial:  10.5 LV IVS:        0.70 cm     LV e' lateral:   20.10 cm/s LVOT diam:     1.90 cm     LV E/e' lateral: 7.5 LV SV:         51 LV SV Index:   27 LVOT Area:     2.84 cm  LV Volumes (MOD) LV vol d, MOD A2C: 55.3 ml LV vol d, MOD A4C: 66.1 ml LV vol s, MOD A2C: 20.6 ml LV vol s, MOD A4C: 25.6 ml LV SV MOD A2C:     34.7 ml LV SV MOD A4C:     66.1 ml LV SV MOD BP:      39.3 ml RIGHT VENTRICLE RV S prime:     17.90 cm/s TAPSE (M-mode): 2.5 cm LEFT ATRIUM             Index       RIGHT ATRIUM           Index LA diam:        2.90 cm 1.56 cm/m  RA Area:     19.00 cm LA Vol (A2C):   29.5 ml 15.88 ml/m RA Volume:   57.30 ml  30.84 ml/m LA Vol (A4C):   35.4 ml 19.05 ml/m LA Biplane Vol: 33.4 ml 17.97 ml/m  AORTIC VALVE LVOT Vmax:   124.00 cm/s LVOT Vmean:  76.450 cm/s LVOT VTI:    0.179 m  AORTA Ao Root diam: 2.80 cm Ao Asc diam:  2.60 cm MITRAL VALVE MV Area (PHT): 5.97 cm     SHUNTS MV Decel Time: 127 msec     Systemic VTI:  0.18 m MV E velocity: 150.00 cm/s  Systemic Diam: 1.90 cm MV A velocity: 145.00 cm/s MV E/A ratio:  1.03 Chilton Si MD Electronically signed by Chilton Si MD Signature Date/Time:  01/24/2021/1:34:19 PM    Final    VAS [place] LOWER EXTREMITY VENOUS (DVT) (ONLY [facility] & [facility] 7a-7p)  Result Date: 01/23/2021  Lower Venous DVT Study Patient Name:  Andrea Hayes  Date of Exam:   01/23/2021 Medical Rec #: [id number]       Accession #:    [id number]  Date of Birth: 1990/11/04       Patient Gender: F Patient Age:   29Y Exam [facility]  [facility] Procedure:      VAS [place] LOWER EXTREMITY VENOUS (DVT) Referring Phys: [id number] Mancel Bale --------------------------------------------------------------------------------  Indications: Pulmonary embolism.  Comparison Study: 11/28/20 prior Performing Technologist: [place] Ponder RVS  Examination Guidelines: A complete evaluation includes B-mode imaging, spectral Doppler, color Doppler, and power Doppler as needed of all accessible portions of each vessel. Bilateral testing is considered an integral part of a complete examination. Limited examinations for reoccurring indications may be performed as noted. The reflux portion of the exam is performed with the patient in reverse Trendelenburg.  +---------+---------------+---------+-----------+----------+--------------+ RIGHT    CompressibilityPhasicitySpontaneityPropertiesThrombus Aging +---------+---------------+---------+-----------+----------+--------------+ CFV      Full           Yes      Yes                                 +---------+---------------+---------+-----------+----------+--------------+ SFJ      Full                                                        +---------+---------------+---------+-----------+----------+--------------+ FV Prox  Full                                                        +---------+---------------+---------+-----------+----------+--------------+ FV Mid   Full                                                        +---------+---------------+---------+-----------+----------+--------------+ FV DistalFull                                                         +---------+---------------+---------+-----------+----------+--------------+ PFV      Full                                                        +---------+---------------+---------+-----------+----------+--------------+ POP      Full           Yes      Yes                                 +---------+---------------+---------+-----------+----------+--------------+ PTV      Full                                                        +---------+---------------+---------+-----------+----------+--------------+  PERO     Full                                                        +---------+---------------+---------+-----------+----------+--------------+   +---------+---------------+---------+-----------+----------+--------------+ LEFT     CompressibilityPhasicitySpontaneityPropertiesThrombus Aging +---------+---------------+---------+-----------+----------+--------------+ CFV      Full           Yes      Yes                                 +---------+---------------+---------+-----------+----------+--------------+ SFJ      Full                                                        +---------+---------------+---------+-----------+----------+--------------+ FV Prox  Full                                                        +---------+---------------+---------+-----------+----------+--------------+ FV Mid   Full                                                        +---------+---------------+---------+-----------+----------+--------------+ FV DistalFull                                                        +---------+---------------+---------+-----------+----------+--------------+ PFV      Full                                                        +---------+---------------+---------+-----------+----------+--------------+ POP      Full           Yes      Yes                                  +---------+---------------+---------+-----------+----------+--------------+ PTV      Full                                                        +---------+---------------+---------+-----------+----------+--------------+ PERO     Full                                                        +---------+---------------+---------+-----------+----------+--------------+  Summary: BILATERAL: - No evidence of deep vein thrombosis seen in the lower extremities, bilaterally. -No evidence of popliteal cyst, bilaterally.   *See table(s) above for measurements and observations. Electronically signed by Fabienne Bruns MD on 01/23/2021 at 4:34:57 PM.    Final    US Abdomen Limited RUQ (LIVER/GB)  Result Date: 01/24/2021 CLINICAL DATA:  Elevated LFTs EXAM: ULTRASOUND ABDOMEN LIMITED RIGHT UPPER QUADRANT COMPARISON:  None. FINDINGS: Gallbladder: No gallstones or wall thickening visualized. No sonographic Murphy sign noted by sonographer. Common bile duct: Diameter: 1 mm Liver: No focal lesion identified. Heterogeneous parenchymal echogenicity. Portal vein is patent on color Doppler imaging with normal direction of blood flow towards the liver. 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 IMPRESSION: 1. Heterogeneous appearance of the hepatic parenchyma without focal lesion identified. 2. No biliary ductal dilatation. 3. 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 Patient has known pulmonary embolism established by prior CT angiogram. Electronically Signed   By: Lauralyn Primes M.D.   On: 01/24/2021 14:12    Scheduled Meds:  enoxaparin (LOVENOX) injection  80 mg Subcutaneous BID   lidocaine  1 patch Transdermal Q24H   prenatal multivitamin  1 tablet Oral Q1200   sodium chloride flush  3 mL Intravenous Q12H   warfarin  12.5 mg Oral ONCE-1600   Warfarin - Pharmacist Dosing Inpatient   Does not apply q1600   Continuous Infusions:  sodium chloride        LOS: 2 days   Time spent: 30 minutes   Hughie Closs, MD [facility]  01/25/2021, 11:23 AM   How to contact the [facility] Attending or Consulting provider 7A - 7P or covering provider during after hours 7P -7A, for this patient?  Check the care team in [facility] and look for a) attending/consulting [facility] provider listed and b) the [facility] team listed. Page or secure chat 7A-7P. Log into [email or web address] and use [place]'s universal password to access. If you do not have the password, please contact the hospital operator. Locate the [facility] provider you are looking for under [facility] and page to a number that you can be directly reached. If you still have difficulty reaching the provider, please page the [facility] (Director on Call) for the Hospitalists listed on amion for assistance.

## 2021-01-25 NOTE — Plan of Care (Signed)

## 2021-01-25 NOTE — Plan of Care (Signed)

## 2021-01-25 NOTE — Progress Notes (Addendum)
ANTICOAGULATION CONSULT NOTE - Follow Up Consult  Pharmacy Consult for enoxaparin bridge with warfarin  Indication: pulmonary embolus  No Known Allergies  Patient Measurements: Height: 5\' 5"  (165.1 cm) Weight: 78.3 kg (172 lb 9.9 oz) IBW/kg (Calculated) : 57  Vital Signs: Temp: 98 F (36.7 C) (07/27 0326) BP: 122/92 (07/27 0326) Pulse Rate: 110 (07/27 0326)  Labs: Recent Labs    01/23/21 0947 01/23/21 0954 01/23/21 1540 01/24/21 0326 01/25/21 0138  HGB 12.0 12.9  --  11.2* 11.8*  HCT 37.8 38.0  --  35.8* 38.3  PLT 145*  --   --  154 190  APTT 30  --   --   --   --   LABPROT 14.1  --   --  14.6 15.0  INR 1.1  --   --  1.1 1.2  CREATININE 0.64  --   --  0.68 0.65  TROPONINIHS  --   --  8  --   --     Estimated Creatinine Clearance: 106.3 mL/min (by C-G formula based on SCr of 0.65 mg/dL).  Assessment: Patient is on day 3 of enoxaparin and warfarin bridge. INR has remained subtherapeutic (1.1-1.2) while receiving warfarin 10 mg daily.  Potential drug-drug interaction with azithromycin and warfarin (azithromycin increases INR). Messaged MD about interaction and MD agreed to DC antibiotics. Given the patients age and indication, increasing the dose to 12.5 mg is reasonable.   Goal of Therapy:  INR 2-3 Monitor platelets by anticoagulation protocol: Yes   Plan:  Warfarin 12.5 mg x1 Continue enoxaparin 80 mg BID DC ceftriaxone and azithromycin Repeat INR tomorrow Monitor CBC, sign/symptoms of bleeding  01/27/21, Pharm.D. PGY-1 Ambulatory Care Resident Phone:[phone] 01/25/2021 9:03 AM

## 2021-01-25 NOTE — Plan of Care (Signed)

## 2021-01-26 ENCOUNTER — Other Ambulatory Visit (HOSPITAL_COMMUNITY): Payer: Self-pay

## 2021-01-26 DIAGNOSIS — I82220 Acute embolism and thrombosis of inferior vena cava: Secondary | ICD-10-CM

## 2021-01-26 DIAGNOSIS — I2699 Other pulmonary embolism without acute cor pulmonale: Secondary | ICD-10-CM | POA: Diagnosis not present

## 2021-01-26 DIAGNOSIS — J9601 Acute respiratory failure with hypoxia: Secondary | ICD-10-CM

## 2021-01-26 LAB — COMPREHENSIVE METABOLIC PANEL
ALT: 111 U/L — ABNORMAL HIGH (ref 0–44)
AST: 89 U/L — ABNORMAL HIGH (ref 15–41)
Albumin: 2.3 g/dL — ABNORMAL LOW (ref 3.5–5.0)
Alkaline Phosphatase: 236 U/L — ABNORMAL HIGH (ref 38–126)
Anion gap: 11 (ref 5–15)
BUN: 5 mg/dL — ABNORMAL LOW (ref 6–20)
CO2: 25 mmol/L (ref 22–32)
Calcium: 9 mg/dL (ref 8.9–10.3)
Chloride: 101 mmol/L (ref 98–111)
Creatinine, Ser: 0.65 mg/dL (ref 0.44–1.00)
GFR, Estimated: >60 mL/min (ref >60)
Glucose, Bld: 155 mg/dL — ABNORMAL HIGH (ref 70–99)
Potassium: 3.4 mmol/L — ABNORMAL LOW (ref 3.5–5.1)
Sodium: 137 mmol/L (ref 135–145)
Total Bilirubin: 0.2 mg/dL — ABNORMAL LOW (ref 0.3–1.2)
Total Protein: 7.4 g/dL (ref 6.5–8.1)

## 2021-01-26 LAB — PROTIME-INR
INR: 1.7 — ABNORMAL HIGH (ref 0.8–1.2)
Prothrombin Time: 19.9 seconds — ABNORMAL HIGH (ref 11.4–15.2)

## 2021-01-26 MED ORDER — WARFARIN SODIUM 5 MG PO TABS: 10.0000 mg | ORAL_TABLET | Freq: Once | ORAL | Status: DC

## 2021-01-26 MED ORDER — ENOXAPARIN SODIUM 80 MG/0.8ML IJ SOSY
80.0000 mg | PREFILLED_SYRINGE | Freq: Two times a day (BID) | INTRAMUSCULAR | 0 refills | Status: DC
  Filled 2021-01-26: qty 4, 3d supply, fill #0

## 2021-01-26 MED ORDER — WARFARIN SODIUM 5 MG PO TABS
10.0000 mg | ORAL_TABLET | Freq: Once | ORAL | Status: AC
  Administered 2021-01-26: 10 mg via ORAL
  Filled 2021-01-26: qty 2

## 2021-01-26 MED ORDER — WARFARIN VIDEO: Freq: Once | Status: DC

## 2021-01-26 MED ORDER — POTASSIUM CHLORIDE CRYS ER 20 MEQ PO TBCR
40.0000 meq | EXTENDED_RELEASE_TABLET | Freq: Once | ORAL | Status: AC
  Administered 2021-01-26: 40 meq via ORAL
  Filled 2021-01-26: qty 2

## 2021-01-26 MED ORDER — WARFARIN VIDEO: Freq: Once | Status: AC

## 2021-01-26 MED ORDER — WARFARIN SODIUM 7.5 MG PO TABS
7.5000 mg | ORAL_TABLET | Freq: Every day | ORAL | 0 refills | Status: DC
  Filled 2021-01-26: qty 10, 10d supply, fill #0

## 2021-01-26 NOTE — Progress Notes (Addendum)
ANTICOAGULATION CONSULT NOTE - Follow Up Consult  Pharmacy Consult for enoxaparin bridge with warfarin  Indication: pulmonary embolus  No Known Allergies  Patient Measurements: Height: 5\' 5"  (165.1 cm) Weight: 78.3 kg (172 lb 9.9 oz) IBW/kg (Calculated) : 57  Vital Signs: Temp: 98.9 F (37.2 C) (07/28 0300) Temp Source: Oral (07/27 2126) BP: 132/91 (07/28 0300) Pulse Rate: 100 (07/28 0300)  Labs: Recent Labs    01/23/21 0947 01/23/21 0954 01/23/21 1540 01/24/21 0326 01/25/21 0138 01/26/21 0136  HGB 12.0 12.9  --  11.2* 11.8*  --   HCT 37.8 38.0  --  35.8* 38.3  --   PLT 145*  --   --  154 190  --   APTT 30  --   --   --   --   --   LABPROT 14.1  --   --  14.6 15.0 19.9*  INR 1.1  --   --  1.1 1.2 1.7*  CREATININE 0.64  --   --  0.68 0.65  --   TROPONINIHS  --   --  8  --   --   --      Estimated Creatinine Clearance: 106.3 mL/min (by C-G formula based on SCr of 0.65 mg/dL).  Assessment: Patient is on day 4 of enoxaparin and warfarin bridge.  INR increased from 1.2 to 1.7 when the dose was increased from 10 mg to 12.5 mg. Will give another dose of 10 mg today to reach INR goal of 2-3. Per CHEST guidelines, recommend an enoxaparin bridge for a minimum of 5 days with INR >2 for two days.   Will send home on Warfarin 7.5 mg daily. Recommended prior to discharge, schedule an outpatient follow-up INR check on Monday 01/30/21 then weekly until INR stable. Have PCP adjust dose accordingly.   Goal of Therapy:  INR 2-3 Monitor platelets by anticoagulation protocol: Yes   Plan:  Warfarin 10 mg x1 7/28 prior to discharge Start warfarin 7.5 mg daily on 7/29 Continue enoxaparin 80 mg BID 7/25-7/30 for a minimum of 5 day bridge with INR >2 for two days Follow up INR on Monday 8/1 Monitor CBC, sign/symptoms of bleeding  Tuesday, Pharm.D. PGY-1 Ambulatory Care Resident Phone:[phone] 01/26/2021 8:17 AM

## 2021-01-26 NOTE — Discharge Instructions (Signed)

## 2021-01-26 NOTE — Progress Notes (Signed)
Nursing Discharge Note  Piv dcd. Site unremarkable. Pressure drsg intact. No bleeding noted. Medicines from [facility] given to patient. Pharmacy in to review coumadin and lovenox education. Patient vebalized understanding. Belongings given to patient. Patients spouse here to take patient home.

## 2021-01-26 NOTE — Progress Notes (Signed)
Appointment for lab work done with Dr Everlena Cooper, pt aware that she will go to her appointment on Monday August 1st at 2pm for PT/INR lab work.

## 2021-01-26 NOTE — Discharge Summary (Addendum)
Physician Discharge Summary  Andrea Hayes WUJ:[id number] DOB: [birth year] DOA: 01/23/2021  PCP: [name], MD  Admit date: 01/23/2021 Discharge date: 01/26/2021 30 Day Unplanned Readmission Risk Score    Flowsheet Row ED to Hosp-Admission (Current) from 01/23/2021 in [place] [facility]  30 Day Unplanned Readmission Risk Score (%) 12.24 Filed at 01/26/2021 0801       This score is the patient's risk of an unplanned readmission within 30 days of being discharged (0 -100%). The score is based on dignosis, age, lab data, medications, orders, and past utilization.   Low:  0-14.9   Medium: 15-21.9   High: 22-29.9   Extreme: 30 and above          Admitted From: Home Disposition: Home  Recommendations for Outpatient Follow-up:  Follow up with PCP on 01/30/2021. Please obtain CMP/CBC and INR when seen by PCP on 01/30/2021. Please follow up with your PCP on the following pending results: Unresulted Labs (From admission, onward)     Start     Ordered   01/24/21 1310  Expectorated Sputum Assessment w Gram Stain, Rflx to Resp Cult  Once,   R        01/24/21 1309   01/24/21 1310  Strep pneumoniae urinary antigen  Once,   STAT        01/24/21 1309   01/24/21 1310  Legionella Pneumophila Serogp 1 Ur Ag  Once,   STAT        01/24/21 1309   01/24/21 0500  Protime-INR  Daily,   R      01/23/21 [place]: None Equipment/Devices: None  Discharge Condition: Stable CODE STATUS: Full code Diet recommendation: Regular  Subjective: Seen and examined.  No complaints.  No back pain or chest pain.  No shortness of breath.  Excited to go home today.  Brief/Interim Summary: Andrea Hayes is a 30 y.o. female with medical history significant of idiopathic intracranial HTN, right arterial ischemic stroke, MCA, cerebral venous thrombosis who presented to ER with right sided back and chest pain and shortness of breath.  No other complaint.  No sick contact,  trauma or recent travel.  She is postpartum x2 months and currently breast-feeding. Was on lovenox in pregnancy, but stopped this in May after she delivered.    Per records on her last admit in 10/2020:  Hypercoagulable work-up was unremarkable including  homocystein 4.8, antithrombin III mildly elevated at 128, low protein S activity of 22, total protein S 109, protein C activity 180, total protein C 127, lupus anticoagulant 25.3, DRVVT 44.7, beta-2-glycoprotein antibodies <9, factor V leiden mutation negative, prothrombin gene mutation negative, cardiolipin antibodies negative, sickle cell screen negative    Upon arrival to ED, her vitals: temp: 100.8, bp: 121/83, HR: 126-->88, RR: 15 and oxygen: 97% on room air.  She had elevated LFTs.  CT PE showed near occlusive right-sided PE with pulmonary infarction. .Since she is lactating so DOAC not recommended.  She was started on Lovenox and Coumadin and admitted to hospital service.  Evaluated by IR, deemed not a candidate for thrombolysis or thrombectomy.  She was also requiring oxygen so met criteria for acute respiratory failure with hypoxia.  Continued to have fever, started on empiric antibiotics for possible pneumonia.  In my opinion, her fever was likely secondary to PE as well and could very well be due to pulmonary infarction.  Her antibiotics were discontinued  yesterday.  She remained afebrile.  No leukocytosis.  She has been on room air since about 36 hours now.  She is excited to go home and see her baby.  Her INR is 1.7.  She was receiving about 12.5 mg of Coumadin here.  Pharmacy recommended that we give her 10 mg of Coumadin today and then starting tomorrow she should be getting 7.5 mg daily with the bridging Lovenox until Saturday.  She has been prescribed Lovenox 5 doses which will end on Saturday evening as well.  We anticipate that her INR will likely be therapeutic tomorrow and she will be overlapped for 24 to 48 hours with bridging Lovenox.   She needs to follow-up with PCP on 01/30/2021 so they can repeat her CBC, CMP as well as INR.  She verbalized understanding.  I have advised RN to make sure that she gets the appointment to see her PCP on 01/30/2021.  Patient is comfortable injecting Lovenox to herself as she was doing that during her pregnancy.  She was ruled out of DVT and her echo did not show any right heart strain.   PS: Patient was also diagnosed with incidental finding of intrahepatic portion of IVC thrombosis.  Patient's LFTs were elevated likely secondary to that as well.  She was ruled out of acute viral hepatitis panel.  Her LFTs are improving.  She has no abdominal pain or tenderness.  Hoping that her LFTs improve over the course of next few days.  Discharge Diagnoses:  Principal Problem:   Pulmonary embolism (HCC) Active Problems:   Idiopathic intracranial hypertension   HTN (hypertension)   Chronic right arterial ischemic stroke, MCA (middle cerebral artery)   Leukocytosis   Thrombocytopenia (HCC)   Elevated liver enzymes   Chest pain   Back pain   IVC thrombosis (HCC)   Acute respiratory failure with hypoxia [facility])    Discharge Instructions   Allergies as of 01/26/2021   No Known Allergies      Medication List     TAKE these medications    acetaminophen 325 MG tablet Commonly known as: TYLENOL Take 2 tablets (650 mg total) by mouth every 6 (six) hours as needed for mild pain or headache (do not take over 3000 mg in a day of tylenol. please note there is tylenol in the fiorecet).   acetaZOLAMIDE 125 MG tablet Commonly known as: DIAMOX Take 2 tablets (250 mg total) by mouth 2 (two) times daily.   enoxaparin 80 MG/0.8ML injection Commonly known as: LOVENOX Inject 1 syringe (80 mg total) into the skin 2 (two) times daily for 5 doses. What changed:  medication strength how much to take when to take this   Prenatal Vitamin 27-0.8 MG Tabs Take 1 tablet by mouth daily.   warfarin 7.5 MG  tablet Commonly known as: Coumadin Take 1 tablet (7.5 mg total) by mouth daily for 10 days. Start taking on: January 27, 2021        Follow-up Information     [name], MD Follow up.   Specialty: Internal Medicine Contact information: [address] [place] [id number] [phone]                No Known Allergies  Consultations: None   Procedures/Studies: DG Chest 2 View  Result Date: 01/23/2021 CLINICAL DATA:  Right upper back/chest pain. EXAM: CHEST - 2 VIEW COMPARISON:  January 27, 2020 FINDINGS: Decreased lung volumes are seen which is likely, in part, secondary to the degree of  patient inspiration. Subsequent crowding of the bronchovascular lung markings is seen. Mild right basilar atelectasis and/or infiltrate is present. There is no evidence of a pleural effusion or pneumothorax. The heart size and mediastinal contours are within normal limits. The visualized skeletal structures are unremarkable. IMPRESSION: Low lung volumes with mild right basilar atelectasis and/or infiltrate. Electronically Signed   By: Virgina Norfolk M.D.   On: 01/23/2021 01:58   CT Angio Chest PE W/Cm &/Or Wo Cm  Result Date: 01/23/2021 CLINICAL DATA:  Three months postpartum, now with right upper chest and back pain for the past several days. Evaluate for pulmonary embolism. EXAM: CT ANGIOGRAPHY CHEST WITH CONTRAST TECHNIQUE: Multidetector CT imaging of the chest was performed using the standard protocol during bolus administration of intravenous contrast. Multiplanar CT image reconstructions and MIPs were obtained to evaluate the vascular anatomy. CONTRAST:  165mL OMNIPAQUE IOHEXOL 350 MG/ML SOLN COMPARISON:  Chest radiograph-01/23/2021; noncontrast CT of the abdomen pelvis-01/22/2021 FINDINGS: Vascular Findings: There is adequate opacification of the pulmonary arterial system with the main pulmonary artery measuring 249 Hounsfield units. There is an occlusive filling defect involving the  right lower lobe pulmonary artery (image 57, series 5), with associated consolidative opacities within the right lower lobe favored to represent pulmonary infarction. These findings are associated with a trace presumably reactive right-sided pleural effusion. No additional discrete filling defects are seen within the pulmonary tree to suggest additional sites of pulmonary embolism. Normal caliber of the main pulmonary artery. There is no definitive CT evidence of right-sided heart strain. Borderline cardiomegaly. Trace amount of pericardial fluid, presumably physiologic. No evidence of thoracic aortic aneurysm or dissection on this non gated examination. 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 The branch vessels of the aortic arch appear patent throughout their imaged courses. Review of the MIP images confirms the above findings. ---------------------------------------------------------------------------------- Nonvascular Findings: Mediastinum/Lymph Nodes: No bulky mediastinal, hilar or axillary lymphadenopathy. Lungs/Pleura: Mixed heterogeneous and consolidative airspace opacities within the right lower lobe, slightly progressed compared to noncontrast CT scan the abdomen pelvis performed day prior, nonspecific though in the setting of pulmonary embolism is favored to represent pulmonary infarction. Trace right-sided pleural effusion, likely reactive in etiology. Punctate nodular right lower lobe opacities are favored to represent areas of atelectasis. Minimal amount of dependent subpleural ground-glass atelectasis within the left lower lobe. The left lung remains well aerated.  No left-sided pleural effusion. Scattered occlusion several right lower lobe distal subsegmental bronchi. The remaining central pulmonary airways appear widely patent. No pneumothorax. Upper abdomen: Limited early arterial phase evaluation of the upper abdomen  suggests hepatomegaly. Musculoskeletal: No acute or aggressive osseous abnormalities. Heterogeneous breast parenchyma compatible with postpartum state. Normal appearance of the thyroid gland. IMPRESSION: 1. Examination is positive for near occlusive pulmonary embolism solely involving the right lower lobe pulmonary artery with associated heterogeneous/consolidative right lower lobe opacities, nonspecific though favored to represent pulmonary infarction in the setting of pulmonary embolism. These findings are associated with a trace presumably reactive right-sided pleural effusion. No additional sites of pulmonary embolism are definitively identified. Overall clot burden is deemed small in volume and there is no CT evidence of right-sided heart strain. 2. 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 Critical Value/emergent results were called by telephone at the time of interpretation on 01/23/2021 at 11:43 am to provider [facility] , who verbally acknowledged these results. Electronically Signed   By: Sandi Mariscal  M.D.   On: 01/23/2021 11:52   ECHOCARDIOGRAM COMPLETE  Result Date: 01/24/2021    ECHOCARDIOGRAM REPORT   Patient Name:   Andrea Hayes Date of Exam: 01/24/2021 Medical Rec #:  [id number]      Height:       65.0 in Accession #:    [id number]     Weight:       172.6 lb Date of Birth:  02/27/1991      BSA:          1.858 m Patient Age:    29 years       BP:           129/86 mmHg Patient Gender: F              HR:           115 bpm. Exam Location:  Inpatient Procedure: 2D Echo, Cardiac Doppler and Color Doppler Indications:   Pulmonary Embolus I26.09  History:       Patient has no prior history of Echocardiogram examinations.                Stroke.  Sonographer:   Bernadene Person RDCS Referring      [phone] Orma Flaming Phys: IMPRESSIONS  1. Left ventricular ejection fraction, by estimation, is 60 to 65%. The left ventricle has normal  function. The left ventricle has no regional wall motion abnormalities. Left ventricular diastolic parameters are indeterminate.  2. Right ventricular systolic function is normal. The right ventricular size is normal.  3. Right atrial size was mildly dilated.  4. The mitral valve is normal in structure. Trivial mitral valve regurgitation. No evidence of mitral stenosis.  5. The aortic valve is tricuspid. Aortic valve regurgitation is not visualized. No aortic stenosis is present.  6. The inferior vena cava is normal in size with greater than 50% respiratory variability, suggesting right atrial pressure of 3 mmHg. FINDINGS  Left Ventricle: Left ventricular ejection fraction, by estimation, is 60 to 65%. The left ventricle has normal function. The left ventricle has no regional wall motion abnormalities. The left ventricular internal cavity size was normal in size. There is  no left ventricular hypertrophy. Left ventricular diastolic parameters are indeterminate. Right Ventricle: The right ventricular size is normal. No increase in right ventricular wall thickness. Right ventricular systolic function is normal. Left Atrium: Left atrial size was normal in size. Right Atrium: Right atrial size was mildly dilated. Pericardium: There is no evidence of pericardial effusion. Mitral Valve: The mitral valve is normal in structure. Trivial mitral valve regurgitation. No evidence of mitral valve stenosis. Tricuspid Valve: The tricuspid valve is normal in structure. Tricuspid valve regurgitation is not demonstrated. No evidence of tricuspid stenosis. Aortic Valve: The aortic valve is tricuspid. Aortic valve regurgitation is not visualized. No aortic stenosis is present. Pulmonic Valve: The pulmonic valve was normal in structure. Pulmonic valve regurgitation is not visualized. No evidence of pulmonic stenosis. Aorta: The aortic root is normal in size and structure. Venous: The inferior vena cava is normal in size with greater  than 50% respiratory variability, suggesting right atrial pressure of 3 mmHg. IAS/Shunts: No atrial level shunt detected by color flow Doppler.  LEFT VENTRICLE PLAX 2D LVIDd:         4.00 cm     Diastology LVIDs:         2.50 cm     LV e' medial:    14.30 cm/s LV PW:  0.90 cm     LV E/e' medial:  10.5 LV IVS:        0.70 cm     LV e' lateral:   20.10 cm/s LVOT diam:     1.90 cm     LV E/e' lateral: 7.5 LV SV:         51 LV SV Index:   27 LVOT Area:     2.84 cm  LV Volumes (MOD) LV vol d, MOD A2C: 55.3 ml LV vol d, MOD A4C: 66.1 ml LV vol s, MOD A2C: 20.6 ml LV vol s, MOD A4C: 25.6 ml LV SV MOD A2C:     34.7 ml LV SV MOD A4C:     66.1 ml LV SV MOD BP:      39.3 ml RIGHT VENTRICLE RV S prime:     17.90 cm/s TAPSE (M-mode): 2.5 cm LEFT ATRIUM             Index       RIGHT ATRIUM           Index LA diam:        2.90 cm 1.56 cm/m  RA Area:     19.00 cm LA Vol (A2C):   29.5 ml 15.88 ml/m RA Volume:   57.30 ml  30.84 ml/m LA Vol (A4C):   35.4 ml 19.05 ml/m LA Biplane Vol: 33.4 ml 17.97 ml/m  AORTIC VALVE LVOT Vmax:   124.00 cm/s LVOT Vmean:  76.450 cm/s LVOT VTI:    0.179 m  AORTA Ao Root diam: 2.80 cm Ao Asc diam:  2.60 cm MITRAL VALVE MV Area (PHT): 5.97 cm     SHUNTS MV Decel Time: 127 msec     Systemic VTI:  0.18 m MV E velocity: 150.00 cm/s  Systemic Diam: 1.90 cm MV A velocity: 145.00 cm/s MV E/A ratio:  1.03 Skeet Latch MD Electronically signed by Skeet Latch MD Signature Date/Time: 01/24/2021/1:34:19 PM    Final    CT Renal Stone Study  Result Date: 01/22/2021 CLINICAL DATA:  Severe right back pain for 2 days EXAM: CT ABDOMEN AND PELVIS WITHOUT CONTRAST TECHNIQUE: Multidetector CT imaging of the abdomen and pelvis was performed following the standard protocol without IV contrast. COMPARISON:  CT 07/13/2017 FINDINGS: Lower chest: Mixed consolidative and ground-glass opacities seen throughout the right lower lobe and minimally in the right middle lobe. Atelectatic changes in the lung bases  as well. Normal heart size. No pericardial effusion. Hepatobiliary: No visible focal liver lesion within the limitations of this unenhanced CT. Smooth surface contour. No concerning focal liver lesion. Normal gallbladder and biliary tree. Pancreas: No pancreatic ductal dilatation or surrounding inflammatory changes. Spleen: Normal in size. No concerning splenic lesions. Adrenals/Urinary Tract: Normal adrenals. Kidneys are normally located with symmetric enhancement and excretion. No suspicious renal lesion, urolithiasis or hydronephrosis. Urinary bladder is largely decompressed at the time of exam and therefore poorly evaluated by CT imaging. Mild bladder wall thickening likely related to underdistention. Stomach/Bowel: Distal esophagus, stomach and duodenum are unremarkable. Challenging assessment of the bowel and mesentery in the absence of contrast media and given limited intraperitoneal fat. No conspicuous bowel wall thickening or dilatation. Normal appendix in the right lower quadrant. Moderate colonic stool burden. No worrisome colonic thickening or dilatation. No evidence of bowel obstruction. Vascular/Lymphatic: No significant vascular findings are present. No enlarged abdominal or pelvic lymph nodes. Reproductive: Slightly retroverted uterus. No concerning adnexal masses. Other: No abdominopelvic free air or fluid. Postsurgical changes of  the anterior abdominal wall. No bowel containing hernia. Small fat containing umbilical hernia. Musculoskeletal: No acute osseous abnormality or suspicious osseous lesion. 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 IMPRESSION: 1. Mixed consolidative and ground-glass opacities throughout the right lower lobe and minimally in the right middle lobe. Could reflect an acute infectious or inflammatory process. 2. No other acute CT abnormality to provide a cause for patient's symptoms. Specifically, no urolithiasis or hydronephrosis. 3. Mild bladder wall thickening may be  related to underdistention though could correlate with urinalysis. 4. Moderate colonic stool burden. Correlate for slowed transit/features of constipation. 5. Trace free fluid in the deep pelvis, can be physiologic in a reproductive age female. 6. 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 Electronically Signed   By: Lovena Le M.D.   On: 01/22/2021 00:29   VAS [place] LOWER EXTREMITY VENOUS (DVT) (ONLY [facility] & [facility] 7a-7p)  Result Date: 01/23/2021  Lower Venous DVT Study Patient Name:  Andrea Hayes  Date of Exam:   01/23/2021 Medical Rec #: [id number]       Accession #:    [id number] Date of Birth: January 14, 1991       Patient Gender: F Patient Age:   29Y Exam Location:  [facility] Procedure:      VAS [place] LOWER EXTREMITY VENOUS (DVT) Referring Phys: [id number] Daleen Bo --------------------------------------------------------------------------------  Indications: Pulmonary embolism.  Comparison Study: 11/28/20 prior Performing Technologist: Archie Patten RVS  Examination Guidelines: A complete evaluation includes B-mode imaging, spectral Doppler, color Doppler, and power Doppler as needed of all accessible portions of each vessel. Bilateral testing is considered an integral part of a complete examination. Limited examinations for reoccurring indications may be performed as noted. The reflux portion of the exam is performed with the patient in reverse Trendelenburg.  +---------+---------------+---------+-----------+----------+--------------+ RIGHT    CompressibilityPhasicitySpontaneityPropertiesThrombus Aging +---------+---------------+---------+-----------+----------+--------------+ CFV      Full           Yes      Yes                                 +---------+---------------+---------+-----------+----------+--------------+ SFJ      Full                                                        +---------+---------------+---------+-----------+----------+--------------+ FV Prox   Full                                                        +---------+---------------+---------+-----------+----------+--------------+ FV Mid   Full                                                        +---------+---------------+---------+-----------+----------+--------------+ FV DistalFull                                                        +---------+---------------+---------+-----------+----------+--------------+  PFV      Full                                                        +---------+---------------+---------+-----------+----------+--------------+ POP      Full           Yes      Yes                                 +---------+---------------+---------+-----------+----------+--------------+ PTV      Full                                                        +---------+---------------+---------+-----------+----------+--------------+ PERO     Full                                                        +---------+---------------+---------+-----------+----------+--------------+   +---------+---------------+---------+-----------+----------+--------------+ LEFT     CompressibilityPhasicitySpontaneityPropertiesThrombus Aging +---------+---------------+---------+-----------+----------+--------------+ CFV      Full           Yes      Yes                                 +---------+---------------+---------+-----------+----------+--------------+ SFJ      Full                                                        +---------+---------------+---------+-----------+----------+--------------+ FV Prox  Full                                                        +---------+---------------+---------+-----------+----------+--------------+ FV Mid   Full                                                        +---------+---------------+---------+-----------+----------+--------------+ FV DistalFull                                                         +---------+---------------+---------+-----------+----------+--------------+ PFV      Full                                                        +---------+---------------+---------+-----------+----------+--------------+   POP      Full           Yes      Yes                                 +---------+---------------+---------+-----------+----------+--------------+ PTV      Full                                                        +---------+---------------+---------+-----------+----------+--------------+ PERO     Full                                                        +---------+---------------+---------+-----------+----------+--------------+     Summary: BILATERAL: - No evidence of deep vein thrombosis seen in the lower extremities, bilaterally. -No evidence of popliteal cyst, bilaterally.   *See table(s) above for measurements and observations. Electronically signed by Ruta Hinds MD on 01/23/2021 at 4:34:57 PM.    Final    US Abdomen Limited RUQ (LIVER/GB)  Result Date: 01/24/2021 CLINICAL DATA:  Elevated LFTs EXAM: ULTRASOUND ABDOMEN LIMITED RIGHT UPPER QUADRANT COMPARISON:  None. FINDINGS: Gallbladder: No gallstones or wall thickening visualized. No sonographic Murphy sign noted by sonographer. Common bile duct: Diameter: 1 mm Liver: No focal lesion identified. Heterogeneous parenchymal echogenicity. Portal vein is patent on color Doppler imaging with normal direction of blood flow towards the liver. 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 IMPRESSION: 1. Heterogeneous appearance of the hepatic parenchyma without focal lesion identified. 2. No biliary ductal dilatation. 3. 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 Patient has known pulmonary embolism established by prior CT angiogram. Electronically Signed   By: Eddie Candle M.D.   On: 01/24/2021 14:12     Discharge Exam: Vitals:   01/26/21  0300 01/26/21 0927  BP: (!) 132/91 (!) 126/94  Pulse: 100 (!) 107  Resp: 16 17  Temp: 98.9 F (37.2 C) 98.5 F (36.9 C)  SpO2: 99% 98%   Vitals:   01/25/21 2126 01/26/21 0130 01/26/21 0300 01/26/21 0927  BP: (!) 140/98  (!) 132/91 (!) 126/94  Pulse: (!) 112 100 100 (!) 107  Resp: $Remo'16  16 17  'IvmAe$ Temp: 98.9 F (37.2 C)  98.9 F (37.2 C) 98.5 F (36.9 C)  TempSrc: Oral   Oral  SpO2: 99%  99% 98%  Weight:      Height:        General: Pt is alert, awake, not in acute distress Cardiovascular: RRR, S1/S2 +, no rubs, no gallops Respiratory: CTA bilaterally, no wheezing, no rhonchi Abdominal: Soft, NT, ND, bowel sounds + Extremities: no edema, no cyanosis    The results of significant diagnostics from this hospitalization (including imaging, microbiology, ancillary and laboratory) are listed below for reference.     Microbiology: Recent Results (from the past 240 hour(s))  Resp Panel by RT-PCR (Flu A&B, Covid) Nasopharyngeal Swab     Status: None   Collection Time: 01/21/21 11:21 PM   Specimen: Nasopharyngeal Swab; Nasopharyngeal(NP) swabs in vial transport medium  Result Value Ref Range Status  SARS Coronavirus 2 by RT PCR NEGATIVE NEGATIVE Final    Comment: (NOTE) SARS-CoV-2 target nucleic acids are NOT DETECTED.  The SARS-CoV-2 RNA is generally detectable in upper respiratory specimens during the acute phase of infection. The lowest concentration of SARS-CoV-2 viral copies this assay can detect is 138 copies/mL. A negative result does not preclude SARS-Cov-2 infection and should not be used as the sole basis for treatment or other patient management decisions. A negative result may occur with  improper specimen collection/handling, submission of specimen other than nasopharyngeal swab, presence of viral mutation(s) within the areas targeted by this assay, and inadequate number of viral copies(<138 copies/mL). A negative result must be combined with clinical observations,  patient history, and epidemiological information. The expected result is Negative.  Fact Sheet for Patients:  EntrepreneurPulse.com.au  Fact Sheet for Healthcare Providers:  IncredibleEmployment.be  This test is no t yet approved or cleared by the [place] FDA and  has been authorized for detection and/or diagnosis of SARS-CoV-2 by FDA under an Emergency Use Authorization (EUA). This EUA will remain  in effect (meaning this test can be used) for the duration of the COVID-19 declaration under Section 564(b)(1) of the Act, 21 U.S.C.section 360bbb-3(b)(1), unless the authorization is terminated  or revoked sooner.       Influenza A by PCR NEGATIVE NEGATIVE Final   Influenza B by PCR NEGATIVE NEGATIVE Final    Comment: (NOTE) The Xpert Xpress SARS-CoV-2/FLU/RSV plus assay is intended as an aid in the diagnosis of influenza from Nasopharyngeal swab specimens and should not be used as a sole basis for treatment. Nasal washings and aspirates are unacceptable for Xpert Xpress SARS-CoV-2/FLU/RSV testing.  Fact Sheet for Patients: EntrepreneurPulse.com.au  Fact Sheet for Healthcare Providers: IncredibleEmployment.be  This test is not yet approved or cleared by the [place] FDA and has been authorized for detection and/or diagnosis of SARS-CoV-2 by FDA under an Emergency Use Authorization (EUA). This EUA will remain in effect (meaning this test can be used) for the duration of the COVID-19 declaration under Section 564(b)(1) of the Act, 21 U.S.C. section 360bbb-3(b)(1), unless the authorization is terminated or revoked.  Performed at [facility], [place] [address] [place], [place] [address]   Culture, blood (routine x 2)     Status: None (Preliminary result)   Collection Time: 01/23/21  9:44 AM   Specimen: BLOOD  Result Value Ref Range Status   Specimen Description BLOOD LEFT ANTECUBITAL  Final    Special Requests   Final    BOTTLES DRAWN AEROBIC AND ANAEROBIC Blood Culture results may not be optimal due to an excessive volume of blood received in culture bottles   Culture   Final    NO GROWTH 2 DAYS Performed at [facility], [place] [address], [place], [place] [address]    Report Status PENDING  Incomplete  Culture, blood (routine x 2)     Status: None (Preliminary result)   Collection Time: 01/23/21  9:47 AM   Specimen: BLOOD  Result Value Ref Range Status   Specimen Description BLOOD SITE NOT SPECIFIED  Final   Special Requests   Final    BOTTLES DRAWN AEROBIC AND ANAEROBIC Blood Culture adequate volume   Culture   Final    NO GROWTH 2 DAYS Performed at [facility], [address] [place], [place] [address]    Report Status PENDING  Incomplete  Resp Panel by RT-PCR (Flu A&B, Covid) Nasopharyngeal Swab  Status: None   Collection Time: 01/23/21 10:00 AM   Specimen: Nasopharyngeal Swab; Nasopharyngeal(NP) swabs in vial transport medium  Result Value Ref Range Status   SARS Coronavirus 2 by RT PCR NEGATIVE NEGATIVE Final    Comment: (NOTE) SARS-CoV-2 target nucleic acids are NOT DETECTED.  The SARS-CoV-2 RNA is generally detectable in upper respiratory specimens during the acute phase of infection. The lowest concentration of SARS-CoV-2 viral copies this assay can detect is 138 copies/mL. A negative result does not preclude SARS-Cov-2 infection and should not be used as the sole basis for treatment or other patient management decisions. A negative result may occur with  improper specimen collection/handling, submission of specimen other than nasopharyngeal swab, presence of viral mutation(s) within the areas targeted by this assay, and inadequate number of viral copies(<138 copies/mL). A negative result must be combined with clinical observations, patient history, and epidemiological information. The expected result is Negative.  Fact Sheet for  Patients:  EntrepreneurPulse.com.au  Fact Sheet for Healthcare Providers:  IncredibleEmployment.be  This test is no t yet approved or cleared by the [place] FDA and  has been authorized for detection and/or diagnosis of SARS-CoV-2 by FDA under an Emergency Use Authorization (EUA). This EUA will remain  in effect (meaning this test can be used) for the duration of the COVID-19 declaration under Section 564(b)(1) of the Act, 21 U.S.C.section 360bbb-3(b)(1), unless the authorization is terminated  or revoked sooner.       Influenza A by PCR NEGATIVE NEGATIVE Final   Influenza B by PCR NEGATIVE NEGATIVE Final    Comment: (NOTE) The Xpert Xpress SARS-CoV-2/FLU/RSV plus assay is intended as an aid in the diagnosis of influenza from Nasopharyngeal swab specimens and should not be used as a sole basis for treatment. Nasal washings and aspirates are unacceptable for Xpert Xpress SARS-CoV-2/FLU/RSV testing.  Fact Sheet for Patients: EntrepreneurPulse.com.au  Fact Sheet for Healthcare Providers: IncredibleEmployment.be  This test is not yet approved or cleared by the [place] FDA and has been authorized for detection and/or diagnosis of SARS-CoV-2 by FDA under an Emergency Use Authorization (EUA). This EUA will remain in effect (meaning this test can be used) for the duration of the COVID-19 declaration under Section 564(b)(1) of the Act, 21 U.S.C. section 360bbb-3(b)(1), unless the authorization is terminated or revoked.  Performed at [facility], [place] [address], [place], [place] [address]   Urine Culture     Status: Abnormal   Collection Time: 01/23/21  9:23 PM   Specimen: Urine, Clean Catch  Result Value Ref Range Status   Specimen Description URINE, CLEAN CATCH  Final   Special Requests   Final    NONE Performed at [facility], [place] [address] [place], [place] [address]     Culture MULTIPLE SPECIES PRESENT, SUGGEST RECOLLECTION (A)  Final   Report Status 01/25/2021 FINAL  Final     Labs: BNP (last 3 results) Recent Labs    11/28/20 0655  BNP 08.6   Basic Metabolic Panel: Recent Labs  Lab 01/21/21 2320 01/23/21 0947 01/23/21 0954 01/24/21 0326 01/25/21 0138 01/26/21 0820  NA 137 135 137 137 137 137  K 4.2 3.9 4.0 4.0 4.0 3.4*  CL 102 101  --  104 99 101  CO2 26 26  --  $R'26 28 25  'rY$ GLUCOSE 107* 117*  --  118* 94 155*  BUN 6 7  --  9 9 5*  CREATININE 0.66 0.64  --  0.68 0.65 0.65  CALCIUM 9.2 9.1  --  8.7* 9.5 9.0   Liver Function Tests: Recent Labs  Lab 01/21/21 2320 01/23/21 0947 01/24/21 0326 01/25/21 0138 01/26/21 0820  AST 67* 82* 76* 145* 89*  ALT 111* 98* 88* 127* 111*  ALKPHOS 133* 151* 154* 253* 236*  BILITOT 0.7 1.0 0.7 0.6 0.2*  PROT 8.1 7.9 6.7 8.0 7.4  ALBUMIN 3.4* 2.7* 2.2* 2.5* 2.3*   Recent Labs  Lab 01/21/21 2320  LIPASE 24   No results for input(s): AMMONIA in the last 168 hours. CBC: Recent Labs  Lab 01/21/21 2320 01/23/21 0947 01/23/21 0954 01/24/21 0326 01/25/21 0138  WBC 15.4* 14.8*  --  12.7* 9.4  NEUTROABS 12.1* 11.0*  --   --  5.9  HGB 13.4 12.0 12.9 11.2* 11.8*  HCT 42.4 37.8 38.0 35.8* 38.3  MCV 89.8 90.0  --  91.6 91.0  PLT 135* 145*  --  154 190   Cardiac Enzymes: No results for input(s): CKTOTAL, CKMB, CKMBINDEX, TROPONINI in the last 168 hours. BNP: Invalid input(s): POCBNP CBG: No results for input(s): GLUCAP in the last 168 hours. D-Dimer No results for input(s): DDIMER in the last 72 hours. Hgb A1c No results for input(s): HGBA1C in the last 72 hours. Lipid Profile No results for input(s): CHOL, HDL, LDLCALC, TRIG, CHOLHDL, LDLDIRECT in the last 72 hours. Thyroid function studies No results for input(s): TSH, T4TOTAL, T3FREE, THYROIDAB in the last 72 hours.  Invalid input(s): FREET3 Anemia work up No results for input(s): VITAMINB12, FOLATE, FERRITIN, TIBC, IRON, RETICCTPCT in  the last 72 hours. Urinalysis    Component Value Date/Time   COLORURINE YELLOW 01/23/2021 2103   APPEARANCEUR CLEAR 01/23/2021 2103   LABSPEC 1.019 01/23/2021 2103   PHURINE 6.0 01/23/2021 2103   GLUCOSEU NEGATIVE 01/23/2021 2103   HGBUR NEGATIVE 01/23/2021 2103   BILIRUBINUR NEGATIVE 01/23/2021 2103   BILIRUBINUR negaive 10/17/2020 1617   KETONESUR NEGATIVE 01/23/2021 2103   PROTEINUR >=300 (A) 01/23/2021 2103   UROBILINOGEN 0.2 10/17/2020 1617   UROBILINOGEN 0.2 10/23/2012 1135   NITRITE NEGATIVE 01/23/2021 2103   LEUKOCYTESUR NEGATIVE 01/23/2021 2103   Sepsis Labs Invalid input(s): PROCALCITONIN,  WBC,  LACTICIDVEN Microbiology Recent Results (from the past 240 hour(s))  Resp Panel by RT-PCR (Flu A&B, Covid) Nasopharyngeal Swab     Status: None   Collection Time: 01/21/21 11:21 PM   Specimen: Nasopharyngeal Swab; Nasopharyngeal(NP) swabs in vial transport medium  Result Value Ref Range Status   SARS Coronavirus 2 by RT PCR NEGATIVE NEGATIVE Final    Comment: (NOTE) SARS-CoV-2 target nucleic acids are NOT DETECTED.  The SARS-CoV-2 RNA is generally detectable in upper respiratory specimens during the acute phase of infection. The lowest concentration of SARS-CoV-2 viral copies this assay can detect is 138 copies/mL. A negative result does not preclude SARS-Cov-2 infection and should not be used as the sole basis for treatment or other patient management decisions. A negative result may occur with  improper specimen collection/handling, submission of specimen other than nasopharyngeal swab, presence of viral mutation(s) within the areas targeted by this assay, and inadequate number of viral copies(<138 copies/mL). A negative result must be combined with clinical observations, patient history, and epidemiological information. The expected result is Negative.  Fact Sheet for Patients:  EntrepreneurPulse.com.au  Fact Sheet for Healthcare Providers:   IncredibleEmployment.be  This test is no t yet approved or cleared by the [place] FDA and  has been authorized for detection and/or diagnosis of SARS-CoV-2 by FDA under an  Emergency Use Authorization (EUA). This EUA will remain  in effect (meaning this test can be used) for the duration of the COVID-19 declaration under Section 564(b)(1) of the Act, 21 U.S.C.section 360bbb-3(b)(1), unless the authorization is terminated  or revoked sooner.       Influenza A by PCR NEGATIVE NEGATIVE Final   Influenza B by PCR NEGATIVE NEGATIVE Final    Comment: (NOTE) The Xpert Xpress SARS-CoV-2/FLU/RSV plus assay is intended as an aid in the diagnosis of influenza from Nasopharyngeal swab specimens and should not be used as a sole basis for treatment. Nasal washings and aspirates are unacceptable for Xpert Xpress SARS-CoV-2/FLU/RSV testing.  Fact Sheet for Patients: EntrepreneurPulse.com.au  Fact Sheet for Healthcare Providers: IncredibleEmployment.be  This test is not yet approved or cleared by the [place] FDA and has been authorized for detection and/or diagnosis of SARS-CoV-2 by FDA under an Emergency Use Authorization (EUA). This EUA will remain in effect (meaning this test can be used) for the duration of the COVID-19 declaration under Section 564(b)(1) of the Act, 21 U.S.C. section 360bbb-3(b)(1), unless the authorization is terminated or revoked.  Performed at [facility], [place] [address], [place], [place] [address]   Culture, blood (routine x 2)     Status: None (Preliminary result)   Collection Time: 01/23/21  9:44 AM   Specimen: BLOOD  Result Value Ref Range Status   Specimen Description BLOOD LEFT ANTECUBITAL  Final   Special Requests   Final    BOTTLES DRAWN AEROBIC AND ANAEROBIC Blood Culture results may not be optimal due to an excessive volume of blood received in culture bottles   Culture   Final     NO GROWTH 2 DAYS Performed at [facility], [place] [address] [place], [place] [address]    Report Status PENDING  Incomplete  Culture, blood (routine x 2)     Status: None (Preliminary result)   Collection Time: 01/23/21  9:47 AM   Specimen: BLOOD  Result Value Ref Range Status   Specimen Description BLOOD SITE NOT SPECIFIED  Final   Special Requests   Final    BOTTLES DRAWN AEROBIC AND ANAEROBIC Blood Culture adequate volume   Culture   Final    NO GROWTH 2 DAYS Performed at [facility], [address], [place],  [address]    Report Status PENDING  Incomplete  Resp Panel by RT-PCR (Flu A&B, Covid) Nasopharyngeal Swab     Status: None   Collection Time: 01/23/21 10:00 AM   Specimen: Nasopharyngeal Swab; Nasopharyngeal(NP) swabs in vial transport medium  Result Value Ref Range Status   SARS Coronavirus 2 by RT PCR NEGATIVE NEGATIVE Final    Comment: (NOTE) SARS-CoV-2 target nucleic acids are NOT DETECTED.  The SARS-CoV-2 RNA is generally detectable in upper respiratory specimens during the acute phase of infection. The lowest concentration of SARS-CoV-2 viral copies this assay can detect is 138 copies/mL. A negative result does not preclude SARS-Cov-2 infection and should not be used as the sole basis for treatment or other patient management decisions. A negative result may occur with  improper specimen collection/handling, submission of specimen other than nasopharyngeal swab, presence of viral mutation(s) within the areas targeted by this assay, and inadequate number of viral copies(<138 copies/mL). A negative result must be combined with clinical observations, patient history, and epidemiological information. The expected result is Negative.  Fact Sheet for Patients:  EntrepreneurPulse.com.au  Fact Sheet for Healthcare Providers:  IncredibleEmployment.be  This  test is no t yet approved or cleared by the [place] and  has been authorized for detection and/or diagnosis of SARS-CoV-2 by FDA under an Emergency Use Authorization (EUA). This EUA will remain  in effect (meaning this test can be used) for the duration of the COVID-19 declaration under Section 564(b)(1) of the Act, 21 U.S.C.section 360bbb-3(b)(1), unless the authorization is terminated  or revoked sooner.       Influenza A by PCR NEGATIVE NEGATIVE Final   Influenza B by PCR NEGATIVE NEGATIVE Final    Comment: (NOTE) The Xpert Xpress SARS-CoV-2/FLU/RSV plus assay is intended as an aid in the diagnosis of influenza from Nasopharyngeal swab specimens and should not be used as a sole basis for treatment. Nasal washings and aspirates are unacceptable for Xpert Xpress SARS-CoV-2/FLU/RSV testing.  Fact Sheet for Patients: EntrepreneurPulse.com.au  Fact Sheet for Healthcare Providers: IncredibleEmployment.be  This test is not yet approved or cleared by the [place] FDA and has been authorized for detection and/or diagnosis of SARS-CoV-2 by FDA under an Emergency Use Authorization (EUA). This EUA will remain in effect (meaning this test can be used) for the duration of the COVID-19 declaration under Section 564(b)(1) of the Act, 21 U.S.C. section 360bbb-3(b)(1), unless the authorization is terminated or revoked.  Performed at [facility], [place] [address] [place], [place] [address]   Urine Culture     Status: Abnormal   Collection Time: 01/23/21  9:23 PM   Specimen: Urine, Clean Catch  Result Value Ref Range Status   Specimen Description URINE, CLEAN CATCH  Final   Special Requests   Final    NONE Performed at [facility], [place] [address] [place],  [address]    Culture MULTIPLE SPECIES PRESENT, SUGGEST RECOLLECTION (A)  Final   Report Status 01/25/2021 FINAL  Final     Time coordinating discharge: Over 30 minutes  SIGNED:   Darliss Cheney, MD  [facility] 01/26/2021, 10:19 AM  If 7PM-7AM, please contact night-coverage [email or web address]

## 2021-01-28 LAB — CULTURE, BLOOD (ROUTINE X 2)
Culture: NO GROWTH
Culture: NO GROWTH
Special Requests: ADEQUATE

## 2021-01-30 ENCOUNTER — Telehealth: Payer: Self-pay | Admitting: Neurology

## 2021-01-30 ENCOUNTER — Other Ambulatory Visit: Payer: Self-pay

## 2021-01-30 ENCOUNTER — Other Ambulatory Visit: Payer: 59

## 2021-01-30 NOTE — Telephone Encounter (Signed)
Per Dr.Jaffe advise pt to go to the ER.

## 2021-01-30 NOTE — Telephone Encounter (Signed)
Pt called in and wants to speak with doctor immediately. Said she is hurting extremely bad in her head. [phone]

## 2021-02-16 ENCOUNTER — Other Ambulatory Visit (HOSPITAL_COMMUNITY): Payer: Self-pay

## 2021-03-21 ENCOUNTER — Ambulatory Visit: Payer: 59 | Admitting: Neurology

## 2021-03-22 ENCOUNTER — Inpatient Hospital Stay (HOSPITAL_COMMUNITY)
Admission: EM | Admit: 2021-03-22 | Discharge: 2021-03-27 | DRG: 271 | Disposition: A | Discharge status: Home / Self Care | Payer: 59 | Attending: Internal Medicine | Admitting: Internal Medicine

## 2021-03-22 ENCOUNTER — Emergency Department (HOSPITAL_COMMUNITY): Payer: 59

## 2021-03-22 ENCOUNTER — Encounter (HOSPITAL_COMMUNITY): Payer: Self-pay | Admitting: Internal Medicine

## 2021-03-22 DIAGNOSIS — I82423 Acute embolism and thrombosis of iliac vein, bilateral: Secondary | ICD-10-CM | POA: Diagnosis present

## 2021-03-22 DIAGNOSIS — I745 Embolism and thrombosis of iliac artery: Secondary | ICD-10-CM | POA: Diagnosis present

## 2021-03-22 DIAGNOSIS — T45516A Underdosing of anticoagulants, initial encounter: Secondary | ICD-10-CM | POA: Diagnosis present

## 2021-03-22 DIAGNOSIS — D649 Anemia, unspecified: Secondary | ICD-10-CM | POA: Diagnosis present

## 2021-03-22 DIAGNOSIS — Z91128 Patient's intentional underdosing of medication regimen for other reason: Secondary | ICD-10-CM

## 2021-03-22 DIAGNOSIS — I82521 Chronic embolism and thrombosis of right iliac vein: Secondary | ICD-10-CM | POA: Diagnosis present

## 2021-03-22 DIAGNOSIS — Z79899 Other long term (current) drug therapy: Secondary | ICD-10-CM

## 2021-03-22 DIAGNOSIS — I82220 Acute embolism and thrombosis of inferior vena cava: Principal | ICD-10-CM | POA: Diagnosis present

## 2021-03-22 DIAGNOSIS — Z20822 Contact with and (suspected) exposure to covid-19: Secondary | ICD-10-CM | POA: Diagnosis present

## 2021-03-22 DIAGNOSIS — Z86711 Personal history of pulmonary embolism: Secondary | ICD-10-CM

## 2021-03-22 DIAGNOSIS — G932 Benign intracranial hypertension: Secondary | ICD-10-CM | POA: Diagnosis present

## 2021-03-22 DIAGNOSIS — R748 Abnormal levels of other serum enzymes: Secondary | ICD-10-CM | POA: Diagnosis present

## 2021-03-22 DIAGNOSIS — I82409 Acute embolism and thrombosis of unspecified deep veins of unspecified lower extremity: Secondary | ICD-10-CM | POA: Diagnosis present

## 2021-03-22 DIAGNOSIS — Z86718 Personal history of other venous thrombosis and embolism: Secondary | ICD-10-CM | POA: Diagnosis not present

## 2021-03-22 DIAGNOSIS — I82413 Acute embolism and thrombosis of femoral vein, bilateral: Secondary | ICD-10-CM | POA: Diagnosis present

## 2021-03-22 DIAGNOSIS — Z9114 Patient's other noncompliance with medication regimen: Secondary | ICD-10-CM

## 2021-03-22 DIAGNOSIS — Z8673 Personal history of transient ischemic attack (TIA), and cerebral infarction without residual deficits: Secondary | ICD-10-CM | POA: Diagnosis not present

## 2021-03-22 LAB — URINALYSIS, ROUTINE W REFLEX MICROSCOPIC
Bilirubin Urine: NEGATIVE
Glucose, UA: NEGATIVE mg/dL
Ketones, ur: NEGATIVE mg/dL
Leukocytes,Ua: NEGATIVE
Nitrite: NEGATIVE
Protein, ur: >300 mg/dL — AB
Specific Gravity, Urine: >1.03 — ABNORMAL HIGH (ref 1.005–1.030)
pH: 6 (ref 5.0–8.0)

## 2021-03-22 LAB — CBC
HCT: 37.9 % (ref 36.0–46.0)
Hemoglobin: 11.6 g/dL — ABNORMAL LOW (ref 12.0–15.0)
MCH: 26.9 pg (ref 26.0–34.0)
MCHC: 30.6 g/dL (ref 30.0–36.0)
MCV: 87.7 fL (ref 80.0–100.0)
Platelets: 238 10*3/uL (ref 150–400)
RBC: 4.32 MIL/uL (ref 3.87–5.11)
RDW: 13.9 % (ref 11.5–15.5)
WBC: 12.5 10*3/uL — ABNORMAL HIGH (ref 4.0–10.5)
nRBC: 0 % (ref 0.0–0.2)

## 2021-03-22 LAB — COMPREHENSIVE METABOLIC PANEL
ALT: 169 U/L — ABNORMAL HIGH (ref 0–44)
AST: 168 U/L — ABNORMAL HIGH (ref 15–41)
Albumin: 3.1 g/dL — ABNORMAL LOW (ref 3.5–5.0)
Alkaline Phosphatase: 282 U/L — ABNORMAL HIGH (ref 38–126)
Anion gap: 10 (ref 5–15)
BUN: 7 mg/dL (ref 6–20)
CO2: 25 mmol/L (ref 22–32)
Calcium: 9.6 mg/dL (ref 8.9–10.3)
Chloride: 99 mmol/L (ref 98–111)
Creatinine, Ser: 0.59 mg/dL (ref 0.44–1.00)
GFR, Estimated: >60 mL/min (ref >60)
Glucose, Bld: 118 mg/dL — ABNORMAL HIGH (ref 70–99)
Potassium: 4.5 mmol/L (ref 3.5–5.1)
Sodium: 134 mmol/L — ABNORMAL LOW (ref 135–145)
Total Bilirubin: 0.6 mg/dL (ref 0.3–1.2)
Total Protein: 8.2 g/dL — ABNORMAL HIGH (ref 6.5–8.1)

## 2021-03-22 LAB — URINALYSIS, MICROSCOPIC (REFLEX)

## 2021-03-22 LAB — LIPASE, BLOOD: Lipase: 29 U/L (ref 11–51)

## 2021-03-22 LAB — I-STAT BETA HCG BLOOD, ED (MC, WL, AP ONLY): I-stat hCG, quantitative: <5 m[IU]/mL (ref <5)

## 2021-03-22 LAB — PROTIME-INR
INR: 1 (ref 0.8–1.2)
Prothrombin Time: 12.8 seconds (ref 11.4–15.2)

## 2021-03-22 MED ORDER — SODIUM CHLORIDE 0.9 % IV BOLUS
1000.0000 mL | Freq: Once | INTRAVENOUS | Status: AC
  Administered 2021-03-22: 1000 mL via INTRAVENOUS

## 2021-03-22 MED ORDER — SODIUM CHLORIDE 0.9 % IV SOLN: INTRAVENOUS | Status: AC

## 2021-03-22 MED ORDER — IOHEXOL 350 MG/ML SOLN
80.0000 mL | Freq: Once | INTRAVENOUS | Status: AC | PRN
  Administered 2021-03-22: 80 mL via INTRAVENOUS

## 2021-03-22 MED ORDER — MORPHINE SULFATE (PF) 4 MG/ML IV SOLN
4.0000 mg | Freq: Once | INTRAVENOUS | Status: AC
Start: 2021-03-22 — End: 2021-03-22
  Administered 2021-03-22: 4 mg via INTRAVENOUS
  Filled 2021-03-22: qty 1

## 2021-03-22 MED ORDER — HEPARIN (PORCINE) 25000 UT/250ML-% IV SOLN
1500.0000 [IU]/h | INTRAVENOUS | Status: DC
  Administered 2021-03-23: 1300 [IU]/h via INTRAVENOUS
  Filled 2021-03-22 (×2): qty 250

## 2021-03-22 MED ORDER — HEPARIN BOLUS VIA INFUSION
4500.0000 [IU] | Freq: Once | INTRAVENOUS | Status: AC
  Administered 2021-03-23: 4500 [IU] via INTRAVENOUS
  Filled 2021-03-22: qty 4500

## 2021-03-22 MED ORDER — ONDANSETRON HCL 4 MG/2ML IJ SOLN
4.0000 mg | Freq: Once | INTRAMUSCULAR | Status: AC
  Administered 2021-03-22: 4 mg via INTRAVENOUS
  Filled 2021-03-22: qty 2

## 2021-03-22 NOTE — ED Triage Notes (Signed)
Pt reports left flank pain and lower abdominal pain, ongoing for the last 2 weeks. Denies recent fever, vomiting, diarrhea. VSS at present.

## 2021-03-22 NOTE — Progress Notes (Signed)
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   Medical History: Past Medical History:  Diagnosis Date   Stroke [facility])     Medications:  (Not in a hospital admission)  Scheduled:  Infusions:  PRN:   Assessment: 29 yof with a history of intracranial HTN, right arterial ischemic stroke, MCA, cerebral venous thrombosis --there is a question of medical noncompliance. Patient is presenting with flank/abd pain. Heparin per pharmacy consult placed for  IVC infrarenal .  Patient reportedly supposed to be on enoxaparin PTA. INR on arrival 1.  Hgb 11.6;plt 238  Goal of Therapy:  Heparin level 0.3-0.7 units/ml Monitor platelets by anticoagulation protocol: Yes   Plan:  Give 4500 units bolus x 1 Start heparin infusion at 1300 units/hr Check anti-Xa level in 6 hours and daily while on heparin Continue to monitor H&H and platelets  [facility], PharmD, BCPS 03/22/2021 11:27 PM ED Clinical Pharmacist -  [phone]

## 2021-03-22 NOTE — ED Notes (Signed)
Pt notified staff pain has worsened. Pt stated pan is 10/10. Triage RN notified

## 2021-03-22 NOTE — ED Provider Notes (Signed)
[facility] EMERGENCY DEPARTMENT Provider Note   CSN: [id number] Arrival date & time: 03/22/21  0755     History Chief Complaint  Patient presents with   Flank Pain   Abdominal Pain    Andrea Hayes is a 30 y.o. female.  Patient s/p post 4 months postpartum presents with abdominal pain.  Is primarily left and right flank pain, worse on the right has been ongoing for 2 weeks.  There is associated difficulty urinating, but no hematuria or dysuria.  She is also not having any nausea or vomiting.  No diarrhea or constipation.  The pain is constant, worsened today.  No previous abdominal surgeries.  Patient does not drink alcohol due to pregnancy, not having any bloody stool. No prior abdominal surgery.   H/O intracranial HTN, right arterial ischemic stroke, MCA, cerebral venous thrombosis --there is a question of medical noncompliance.  Patient is very unclear about what medicine she is actually taking.  She states she stopped taking a lot of medicine because she was worried it would be bad for the baby.   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   Acute respiratory failure with hypoxia (HCC) 01/26/2021   Chronic right arterial ischemic stroke, MCA (middle cerebral artery) 01/23/2021   Pulmonary embolism (HCC) 01/23/2021   Leukocytosis 01/23/2021   Thrombocytopenia (HCC) 01/23/2021   Elevated liver enzymes 01/23/2021   Chest pain 01/23/2021   Back pain 01/23/2021   Vaginal delivery 12/05/2020   Headache 11/28/2020   HTN (hypertension) 11/28/2020   Severe preeclampsia, delivered 11/04/2020   IUGR (intrauterine growth restriction) affecting care of mother 10/31/2020   Thrombocytopenia affecting pregnancy (HCC) 05/27/2020   Acute right arterial ischemic stroke, middle cerebral artery (MCA) (HCC)    Dural venous sinus thrombosis    Idiopathic intracranial  hypertension    Cerebral venous thrombosis 07/24/2018    Past Surgical History:  Procedure Laterality Date   NO PAST SURGERIES       OB History     Gravida  1   Para  1   Term  1   Preterm      AB      Living  1      SAB      IAB      Ectopic      Multiple  0   Live Births  1           Family History  Problem Relation Age of Onset   Asthma Father    Obesity Neg Hx    Hypertension Neg Hx    Diabetes Neg Hx    Cancer Neg Hx     Social History   Tobacco Use   Smoking status: Never   Smokeless tobacco: Never  Vaping Use   Vaping Use: Never used  Substance Use Topics   Alcohol use: No    Alcohol/week: 0.0 standard drinks   Drug use: No    Home Medications Prior to Admission medications   Medication Sig Start Date End Date Taking? Authorizing Provider  acetaminophen (TYLENOL) 325 MG tablet Take 2 tablets (650 mg total) by mouth every 6 (six) hours as needed for mild pain or headache (do not take over 3000 mg in a day of tylenol. please note there is tylenol in the fiorecet). 07/29/18   Layne Benton, NP  acetaZOLAMIDE (DIAMOX) 125  MG tablet Take 2 tablets (250 mg total) by mouth 2 (two) times daily. 12/02/20   Everlena Cooper, Adam R, DO  enoxaparin (LOVENOX) 80 MG/0.8ML injection Inject 1 syringe (80 mg total) into the skin 2 (two) times daily for 5 doses. 01/26/21 01/29/21  Hughie Closs, MD  Prenatal Vit-Fe Fumarate-FA (PRENATAL VITAMIN) 27-0.8 MG TABS Take 1 tablet by mouth daily. 04/19/20   Aviva Signs, CNM  warfarin (COUMADIN) 7.5 MG tablet Take 1 tablet (7.5 mg total) by mouth daily for 10 days. 01/27/21 02/06/21  Hughie Closs, MD    Allergies    Patient has no known allergies.  Review of Systems   Review of Systems  Constitutional:  Negative for chills and fever.  HENT:  Negative for ear pain and sore throat.   Eyes:  Negative for pain and visual disturbance.  Respiratory:  Negative for cough and shortness of breath.   Cardiovascular:  Negative  for chest pain, palpitations and leg swelling.  Gastrointestinal:  Positive for abdominal distention and abdominal pain. Negative for nausea and vomiting.  Genitourinary:  Positive for flank pain. Negative for dysuria and hematuria.  Musculoskeletal:  Negative for arthralgias and back pain.  Skin:  Negative for color change and rash.  Neurological:  Negative for seizures and syncope.  All other systems reviewed and are negative.  Physical Exam Updated Vital Signs BP 123/83 (BP Location: Right Arm)   Pulse (!) 114   Temp 99.8 F (37.7 C)   Resp 17   Ht 5\' 5"  (1.651 m)   SpO2 99%   BMI 28.73 kg/m   Physical Exam Vitals and nursing note reviewed. Exam conducted with a chaperone present.  Constitutional:      Appearance: Normal appearance.  HENT:     Head: Normocephalic and atraumatic.  Eyes:     General: No scleral icterus.       Right eye: No discharge.        Left eye: No discharge.     Extraocular Movements: Extraocular movements intact.     Pupils: Pupils are equal, round, and reactive to light.  Cardiovascular:     Rate and Rhythm: Regular rhythm. Tachycardia present.     Pulses: Normal pulses.     Heart sounds: Normal heart sounds. No murmur heard.   No friction rub. No gallop.  Pulmonary:     Effort: Pulmonary effort is normal. No respiratory distress.     Breath sounds: Normal breath sounds.  Abdominal:     General: Abdomen is flat and protuberant. Bowel sounds are normal. There is no distension.     Palpations: Abdomen is soft.     Tenderness: There is abdominal tenderness in the right lower quadrant, epigastric area and left lower quadrant. There is no right CVA tenderness, left CVA tenderness or guarding.  Skin:    General: Skin is warm and dry.     Coloration: Skin is not jaundiced.  Neurological:     Mental Status: She is alert. Mental status is at baseline.     Coordination: Coordination normal.   ED Results / Procedures / Treatments   Labs (all labs  ordered are listed, but only abnormal results are displayed) Labs Reviewed  COMPREHENSIVE METABOLIC PANEL - Abnormal; Notable for the following components:      Result Value   Sodium 134 (*)    Glucose, Bld 118 (*)    Total Protein 8.2 (*)    Albumin 3.1 (*)    AST 168 (*)  ALT 169 (*)    Alkaline Phosphatase 282 (*)    All other components within normal limits  CBC - Abnormal; Notable for the following components:   WBC 12.5 (*)    Hemoglobin 11.6 (*)    All other components within normal limits  URINALYSIS, ROUTINE W REFLEX MICROSCOPIC - Abnormal; Notable for the following components:   Specific Gravity, Urine >1.030 (*)    Hgb urine dipstick TRACE (*)    Protein, ur >300 (*)    All other components within normal limits  URINALYSIS, MICROSCOPIC (REFLEX) - Abnormal; Notable for the following components:   Bacteria, UA RARE (*)    All other components within normal limits  LIPASE, BLOOD  PROTIME-INR  I-STAT BETA HCG BLOOD, ED ([facility], [facility], [facility] ONLY)    EKG None  Radiology No results found.  Procedures .Critical Care Performed by: Theron Arista, PA-C Authorized by: Theron Arista, PA-C   Critical care provider statement:    Critical care time (minutes):  45   Critical care was necessary to treat or prevent imminent or life-threatening deterioration of the following conditions:  Circulatory failure   Critical care was time spent personally by me on the following activities:  Discussions with consultants, evaluation of patient's response to treatment, examination of patient, ordering and performing treatments and interventions, ordering and review of laboratory studies, ordering and review of radiographic studies, pulse oximetry, re-evaluation of patient's condition, obtaining history from patient or surrogate and review of old charts   Medications Ordered in ED Medications  sodium chloride 0.9 % bolus 1,000 mL (1,000 mLs Intravenous New Bag/Given 03/22/21 2022)  morphine 4 MG/ML  injection 4 mg (4 mg Intravenous Given 03/22/21 2021)  ondansetron (ZOFRAN) injection 4 mg (4 mg Intravenous Given 03/22/21 2021)    ED Course  I have reviewed the triage vital signs and the nursing notes.  Pertinent labs & imaging results that were available during my care of the patient were reviewed by me and considered in my medical decision making (see chart for details).    MDM Rules/Calculators/A&P                           Patient was recently admitted for PEA status postpartum.  It is very unclear if she is been taking her Lovenox, there is a mix of a language barrier as well as a misunderstanding between the patient and even with the translating service.  I am concerned that she has not been taking the anticoagulation medicine.  She has history of previous IVC clot, there is suspicion of possible hepatic vein involvement due to the LFTs being mildly elevated at that time.  These have increased since then.  She is tachycardic, but not hypoxic.  Abdomen is soft and mildly distended.    Occlusive renal IVC.  We will start patient on heparin drip and plan to admit for hospitalist.  Hospitalist Dr. Toniann Fail was consulted and is happy to admit patient. He requested to involve vascular surgery.  Consult placed.  Spoke with Dr. Myra Gianotti with vascular surgery.  He agrees with a heparin drip and request to keep the patient n.p.o. after midnight.  If he gets called in the hospital he was to go tonight, otherwise he will see her in the morning.    Discussed HPI, physical exam and plan of care for this patient with attending Kristine Royal. The attending physician evaluated this patient as part of a shared visit and agrees  with plan of care.   Final diagnoses:  None    Rx / DC Orders ED Discharge Orders     None        Theron Arista, Cordelia Poche 03/22/21 2253    Wynetta Fines, MD 03/23/21 1323

## 2021-03-22 NOTE — H&P (Signed)
History and Physical    Andrea Hayes GEX:[id number] DOB: 06-29-1991 DOA: 03/22/2021  PCP: Fleet Contras, MD  Patient coming from: Home.  Chief Complaint: Back pain and epigastric pain.  HPI: Andrea Hayes is a 30 y.o. female with history of cerebral thrombosis, idiopathic intracranial hypertension, history of CVA and recently admitted in July 2022 for pulmonary embolism on Lovenox which patient has not been very compliant has missed multiple doses presents to the ER with complaints of flank and back pain and epigastric pain for the last 2 weeks.  Denies any nausea vomiting diarrhea chest pain or shortness of breath.  Pain has been gradually worsening and finding it difficult to lie flat.  ED Course: In the ER patient labs show WBC count of 12.5 hemoglobin 11.6 sodium 134 patient's AST is 168 ALT is 169 total bilirubin 1.6 alkaline phosphatase 282 CT abdomen pelvis shows occlusive thrombus throughout the infrarenal IVC and findings suspicious for bilateral common iliac vein occlusive thrombus and right external iliac vein thrombus for which Dr. Myra Gianotti was consulted.  Dr. Myra Gianotti advised starting patient on heparin and will be seeing patient in consult for further plan likely may need thrombolysis.  COVID test was negative.  Review of Systems: As per HPI, rest all negative.   Past Medical History:  Diagnosis Date   Stroke [facility])     Past Surgical History:  Procedure Laterality Date   NO PAST SURGERIES       reports that she has never smoked. She has never used smokeless tobacco. She reports that she does not drink alcohol and does not use drugs.  No Known Allergies  Family History  Problem Relation Age of Onset   Asthma Father    Obesity Neg Hx    Hypertension Neg Hx    Diabetes Neg Hx    Cancer Neg Hx     Prior to Admission medications   Medication Sig Start Date End Date Taking? Authorizing Provider  acetaZOLAMIDE (DIAMOX) 125 MG tablet Take 2 tablets (250 mg total) by  mouth 2 (two) times daily. Patient taking differently: Take 250 mg by mouth 2 (two) times daily as needed (altitude sickness). 12/02/20  Yes Jaffe, Adam R, DO  enoxaparin (LOVENOX) 80 MG/0.8ML injection Inject 1 syringe (80 mg total) into the skin 2 (two) times daily for 5 doses. 01/26/21 03/22/21 Yes Pahwani, Daleen Bo, MD  Prenatal Vit-Fe Fumarate-FA (PRENATAL VITAMIN) 27-0.8 MG TABS Take 1 tablet by mouth daily. 04/19/20  Yes Aviva Signs, CNM  Vitamin D, Ergocalciferol, (DRISDOL) 1.25 MG (50000 UNIT) CAPS capsule Take 50,000 Units by mouth once a week. 02/24/21  Yes [provider]  acetaminophen (TYLENOL) 325 MG tablet Take 2 tablets (650 mg total) by mouth every 6 (six) hours as needed for mild pain or headache (do not take over 3000 mg in a day of tylenol. please note there is tylenol in the fiorecet). Patient not taking: Reported on 03/22/2021 07/29/18   Layne Benton, NP  warfarin (COUMADIN) 7.5 MG tablet Take 1 tablet (7.5 mg total) by mouth daily for 10 days. Patient not taking: Reported on 03/22/2021 01/27/21 02/06/21  Hughie Closs, MD    Physical Exam: Constitutional: Moderately built and nourished. Vitals:   03/22/21 1425 03/22/21 1731 03/22/21 2045 03/22/21 2342  BP: 115/85 123/83 119/77   Pulse: (!) 119 (!) 114 (!) 104   Resp: 17 17 16    Temp:   99.6 F (37.6 C)   TempSrc:   Oral   SpO2: 100%  99% 98%   Weight:    77.1 kg  Height:    5\' 5"  (1.651 m)   Eyes: Anicteric no pallor. ENMT: No discharge from the ears eyes nose or mouth. Neck: No mass felt.  No neck rigidity. Respiratory: No rhonchi or crepitations. Cardiovascular: S1-S2 heard. Abdomen: Soft nontender bowel sound present. Musculoskeletal: No edema. Skin: No rash. Neurologic: Alert awake oriented time place and person.  Moves all extremities. Psychiatric: Appears normal.  Normal affect.   Labs on Admission: I have personally reviewed following labs and imaging studies  CBC: Recent Labs  Lab  03/22/21 0820  WBC 12.5*  HGB 11.6*  HCT 37.9  MCV 87.7  PLT 238   Basic Metabolic Panel: Recent Labs  Lab 03/22/21 0820  NA 134*  K 4.5  CL 99  CO2 25  GLUCOSE 118*  BUN 7  CREATININE 0.59  CALCIUM 9.6   GFR: Estimated Creatinine Clearance: 105.5 mL/min (by C-G formula based on SCr of 0.59 mg/dL). Liver Function Tests: Recent Labs  Lab 03/22/21 0820  AST 168*  ALT 169*  ALKPHOS 282*  BILITOT 0.6  PROT 8.2*  ALBUMIN 3.1*   Recent Labs  Lab 03/22/21 0820  LIPASE 29   No results for input(s): AMMONIA in the last 168 hours. Coagulation Profile: Recent Labs  Lab 03/22/21 2020  INR 1.0   Cardiac Enzymes: No results for input(s): CKTOTAL, CKMB, CKMBINDEX, TROPONINI in the last 168 hours. BNP (last 3 results) No results for input(s): PROBNP in the last 8760 hours. HbA1C: No results for input(s): HGBA1C in the last 72 hours. CBG: No results for input(s): GLUCAP in the last 168 hours. Lipid Profile: No results for input(s): CHOL, HDL, LDLCALC, TRIG, CHOLHDL, LDLDIRECT in the last 72 hours. Thyroid Function Tests: No results for input(s): TSH, T4TOTAL, FREET4, T3FREE, THYROIDAB in the last 72 hours. Anemia Panel: No results for input(s): VITAMINB12, FOLATE, FERRITIN, TIBC, IRON, RETICCTPCT in the last 72 hours. Urine analysis:    Component Value Date/Time   COLORURINE YELLOW 03/22/2021 0814   APPEARANCEUR CLEAR 03/22/2021 0814   LABSPEC >1.030 (H) 03/22/2021 0814   PHURINE 6.0 03/22/2021 0814   GLUCOSEU NEGATIVE 03/22/2021 0814   HGBUR TRACE (A) 03/22/2021 0814   BILIRUBINUR NEGATIVE 03/22/2021 0814   BILIRUBINUR negaive 10/17/2020 1617   KETONESUR NEGATIVE 03/22/2021 0814   PROTEINUR >300 (A) 03/22/2021 0814   UROBILINOGEN 0.2 10/17/2020 1617   UROBILINOGEN 0.2 10/23/2012 1135   NITRITE NEGATIVE 03/22/2021 0814   LEUKOCYTESUR NEGATIVE 03/22/2021 0814   Sepsis Labs: @LABRCNTIP (procalcitonin:4,lacticidven:4) )No results found for this or any  previous visit (from the past 240 hour(s)).   Radiological Exams on Admission: CT Abdomen Pelvis W Contrast  Result Date: 03/22/2021 CLINICAL DATA:  Abdominal infection suspected. EXAM: CT ABDOMEN AND PELVIS WITH CONTRAST TECHNIQUE: Multidetector CT imaging of the abdomen and pelvis was performed using the standard protocol following bolus administration of intravenous contrast. CONTRAST:  6mL OMNIPAQUE IOHEXOL 350 MG/ML SOLN COMPARISON:  CT abdomen and pelvis 07/13/2017. FINDINGS: Lower chest: There is a trace right pleural effusion. Hepatobiliary: No focal liver abnormality is seen. No gallstones, gallbladder wall thickening, or biliary dilatation. Pancreas: Unremarkable. No pancreatic ductal dilatation or surrounding inflammatory changes. Spleen: Normal in size without focal abnormality. Adrenals/Urinary Tract: Adrenal glands are unremarkable. Kidneys are normal, without renal calculi, focal lesion, or hydronephrosis. Bladder is unremarkable. Stomach/Bowel: Stomach is within normal limits. Appendix appears normal. No evidence of bowel wall thickening, distention, or inflammatory changes. Vascular/Lymphatic: The aorta appears within  normal limits. There is occlusive thrombus throughout the entire infrarenal IVC. There is nonocclusive thrombus in the IVC at the level of the renal arteries and within the intrahepatic IVC. Opacification of the iliac veins is poor, but there are findings suspicious for occlusive thrombus within the right common and external iliac vein and possibly the left common iliac vein. There are prominent bilateral inguinal lymph nodes. Reproductive: Uterus is slightly prominent in size. There is a 2.8 cm cyst in the left ovary. Right adnexa is unremarkable. Other: There is presacral edema as well as diffuse retroperitoneal edema surrounding the IVC. There is no ascites or free intraperitoneal air. Subcutaneous densities in the anterior abdominal wall are likely related to medication  injection sites. Musculoskeletal: No acute or significant osseous findings. IMPRESSION: 1. Occlusive thrombus throughout the infrarenal IVC. Nonocclusive thrombus in the upper abdominal IVC. Findings highly suspicious for bilateral common iliac vein occlusive thrombus and right external iliac vein thrombus. 2. Presacral and retroperitoneal edema likely secondary to venous congestion. Infectious or inflammatory process cannot be excluded. 3. 2.8 cm left ovarian simple-appearing cyst. No follow-up imaging is recommended. Reference: JACR 2020 Feb;17(2):248-254 4. Trace right pleural effusion. 5. These results were called by telephone at the time of interpretation on 03/22/2021 at 10:01 pm to provider Dr. Hyacinth Meeker, who verbally acknowledged these results. Electronically Signed   By: Darliss Cheney M.D.   On: 03/22/2021 22:01     Assessment/Plan Principal Problem:   IVC thrombosis (HCC) Active Problems:   Idiopathic intracranial hypertension   Elevated liver enzymes   DVT (deep venous thrombosis) (HCC)    Occlusive thrombus throughout the infrarenal IVC thrombus with suspicious findings for bilateral common iliac vein thrombus with prior history of PE and cerebral thrombosis with noncompliant with her Lovenox presently on heparin.  We will keep patient n.p.o.  Dr. Myra Gianotti vascular surgery has been consulted.  Per discussion with the ER physician vascular surgery is planning thrombolysis and further procedures.  Keep patient on pain relief medications. Elevated liver enzymes likely from thrombosis.  Follow LFTs closely. History of idiopathic intracranial hypertension takes acetazolamide only as needed.  Presently denies any headache or visual symptoms.  Presently NPO.  Restart once patient can take orally. Prior history of PE in July 2022 has been noncompliant with Lovenox presently denies chest pain or shortness of breath patient not hypoxic presently on heparin. History of cerebral thrombosis presently on  heparin. Anemia appears to be chronic follow CBC.  Check anemia panel with next blood draw.  Given the patient has occlusive thrombus with significant abdominal pain will need close monitoring for any further worsening and further management and inpatient status.   DVT prophylaxis: Heparin. Code Status: Full code. Family Communication: Patient's family at the bedside. Disposition Plan: Home. Consults called: Vascular surgery. Admission status: Inpatient.   Eduard Clos MD [facility] Pager [phone].  If 7PM-7AM, please contact night-coverage [email or web address] Password [facility]  03/22/2021, 11:52 PM

## 2021-03-23 ENCOUNTER — Encounter (HOSPITAL_COMMUNITY)
Admission: EM | Disposition: A | Discharge status: Home / Self Care | Payer: Self-pay | Source: Home / Self Care | Attending: Internal Medicine

## 2021-03-23 DIAGNOSIS — Z86718 Personal history of other venous thrombosis and embolism: Secondary | ICD-10-CM

## 2021-03-23 DIAGNOSIS — Z86711 Personal history of pulmonary embolism: Secondary | ICD-10-CM

## 2021-03-23 DIAGNOSIS — I82220 Acute embolism and thrombosis of inferior vena cava: Secondary | ICD-10-CM

## 2021-03-23 HISTORY — PX: PERIPHERAL VASCULAR THROMBECTOMY: CATH118306

## 2021-03-23 LAB — CBC WITH DIFFERENTIAL/PLATELET
Abs Immature Granulocytes: 0.07 10*3/uL (ref 0.00–0.07)
Basophils Absolute: 0 10*3/uL (ref 0.0–0.1)
Basophils Relative: 0 %
Eosinophils Absolute: 0 10*3/uL (ref 0.0–0.5)
Eosinophils Relative: 0 %
HCT: 35 % — ABNORMAL LOW (ref 36.0–46.0)
Hemoglobin: 10.9 g/dL — ABNORMAL LOW (ref 12.0–15.0)
Immature Granulocytes: 1 %
Lymphocytes Relative: 13 %
Lymphs Abs: 1.6 10*3/uL (ref 0.7–4.0)
MCH: 27.1 pg (ref 26.0–34.0)
MCHC: 31.1 g/dL (ref 30.0–36.0)
MCV: 87.1 fL (ref 80.0–100.0)
Monocytes Absolute: 1 10*3/uL (ref 0.1–1.0)
Monocytes Relative: 8 %
Neutro Abs: 9.9 10*3/uL — ABNORMAL HIGH (ref 1.7–7.7)
Neutrophils Relative %: 78 %
Platelets: 217 10*3/uL (ref 150–400)
RBC: 4.02 MIL/uL (ref 3.87–5.11)
RDW: 14.1 % (ref 11.5–15.5)
WBC: 12.6 10*3/uL — ABNORMAL HIGH (ref 4.0–10.5)
nRBC: 0 % (ref 0.0–0.2)

## 2021-03-23 LAB — HEPATIC FUNCTION PANEL
ALT: 105 U/L — ABNORMAL HIGH (ref 0–44)
AST: 63 U/L — ABNORMAL HIGH (ref 15–41)
Albumin: 2.5 g/dL — ABNORMAL LOW (ref 3.5–5.0)
Alkaline Phosphatase: 248 U/L — ABNORMAL HIGH (ref 38–126)
Bilirubin, Direct: 0.2 mg/dL (ref 0.0–0.2)
Indirect Bilirubin: 0.4 mg/dL (ref 0.3–0.9)
Total Bilirubin: 0.6 mg/dL (ref 0.3–1.2)
Total Protein: 7.5 g/dL (ref 6.5–8.1)

## 2021-03-23 LAB — VITAMIN B12: Vitamin B-12: 314 pg/mL (ref 180–914)

## 2021-03-23 LAB — CBC
HCT: 36.4 % (ref 36.0–46.0)
Hemoglobin: 11.1 g/dL — ABNORMAL LOW (ref 12.0–15.0)
MCH: 26.6 pg (ref 26.0–34.0)
MCHC: 30.5 g/dL (ref 30.0–36.0)
MCV: 87.3 fL (ref 80.0–100.0)
Platelets: 180 10*3/uL (ref 150–400)
RBC: 4.17 MIL/uL (ref 3.87–5.11)
RDW: 14.2 % (ref 11.5–15.5)
WBC: 14.6 10*3/uL — ABNORMAL HIGH (ref 4.0–10.5)
nRBC: 0 % (ref 0.0–0.2)

## 2021-03-23 LAB — FERRITIN: Ferritin: 88 ng/mL (ref 11–307)

## 2021-03-23 LAB — BASIC METABOLIC PANEL
Anion gap: 10 (ref 5–15)
BUN: 5 mg/dL — ABNORMAL LOW (ref 6–20)
CO2: 24 mmol/L (ref 22–32)
Calcium: 8.9 mg/dL (ref 8.9–10.3)
Chloride: 101 mmol/L (ref 98–111)
Creatinine, Ser: 0.62 mg/dL (ref 0.44–1.00)
GFR, Estimated: >60 mL/min (ref >60)
Glucose, Bld: 100 mg/dL — ABNORMAL HIGH (ref 70–99)
Potassium: 3.9 mmol/L (ref 3.5–5.1)
Sodium: 135 mmol/L (ref 135–145)

## 2021-03-23 LAB — RETICULOCYTES
Immature Retic Fract: 18.2 % — ABNORMAL HIGH (ref 2.3–15.9)
RBC.: 4.06 MIL/uL (ref 3.87–5.11)
Retic Count, Absolute: 66.2 10*3/uL (ref 19.0–186.0)
Retic Ct Pct: 1.6 % (ref 0.4–3.1)

## 2021-03-23 LAB — IRON AND TIBC
Iron: 15 ug/dL — ABNORMAL LOW (ref 28–170)
Saturation Ratios: 4 % — ABNORMAL LOW (ref 10.4–31.8)
TIBC: 364 ug/dL (ref 250–450)
UIBC: 349 ug/dL

## 2021-03-23 LAB — FOLATE: Folate: 30.2 ng/mL (ref >5.9)

## 2021-03-23 LAB — RESP PANEL BY RT-PCR (FLU A&B, COVID) ARPGX2
Influenza A by PCR: NEGATIVE
Influenza B by PCR: NEGATIVE
SARS Coronavirus 2 by RT PCR: NEGATIVE

## 2021-03-23 LAB — CBG MONITORING, ED: Glucose-Capillary: 85 mg/dL (ref 70–99)

## 2021-03-23 LAB — HEPARIN LEVEL (UNFRACTIONATED)
Heparin Unfractionated: 0.11 IU/mL — ABNORMAL LOW (ref 0.30–0.70)
Heparin Unfractionated: <0.1 IU/mL — ABNORMAL LOW (ref 0.30–0.70)

## 2021-03-23 LAB — GLUCOSE, CAPILLARY
Glucose-Capillary: 98 mg/dL (ref 70–99)
Glucose-Capillary: 91 mg/dL (ref 70–99)
Glucose-Capillary: 84 mg/dL (ref 70–99)

## 2021-03-23 LAB — PREGNANCY, URINE: Preg Test, Ur: NEGATIVE

## 2021-03-23 LAB — FIBRINOGEN: Fibrinogen: 392 mg/dL (ref 210–475)

## 2021-03-23 SURGERY — PERIPHERAL VASCULAR THROMBECTOMY
Anesthesia: LOCAL

## 2021-03-23 MED ORDER — HEPARIN (PORCINE) IN NACL 1000-0.9 UT/500ML-% IV SOLN
INTRAVENOUS | Status: AC
  Filled 2021-03-23: qty 500

## 2021-03-23 MED ORDER — MORPHINE SULFATE (PF) 4 MG/ML IV SOLN
5.0000 mg | INTRAVENOUS | Status: DC | PRN
Start: 2021-03-23 — End: 2021-03-25
  Administered 2021-03-24: 5 mg via INTRAVENOUS
  Filled 2021-03-23: qty 2

## 2021-03-23 MED ORDER — SODIUM CHLORIDE 0.9 % IV SOLN: 1.0000 mg/h | INTRAVENOUS | Status: DC

## 2021-03-23 MED ORDER — SODIUM CHLORIDE 0.9 % IV SOLN: 250.0000 mL | INTRAVENOUS | Status: DC | PRN

## 2021-03-23 MED ORDER — SODIUM CHLORIDE 0.9 % IV SOLN
0.5000 mg/h | INTRAVENOUS | Status: DC
  Administered 2021-03-23 – 2021-03-24 (×2): 0.5 mg/h
  Filled 2021-03-23 (×4): qty 10

## 2021-03-23 MED ORDER — MIDAZOLAM HCL 2 MG/2ML IJ SOLN
INTRAMUSCULAR | Status: AC
  Filled 2021-03-23: qty 2

## 2021-03-23 MED ORDER — HEPARIN (PORCINE) IN NACL 1000-0.9 UT/500ML-% IV SOLN
INTRAVENOUS | Status: DC | PRN
  Administered 2021-03-23: 500 mL

## 2021-03-23 MED ORDER — SODIUM CHLORIDE 0.9% FLUSH: 3.0000 mL | INTRAVENOUS | Status: DC | PRN

## 2021-03-23 MED ORDER — ONDANSETRON HCL 4 MG/2ML IJ SOLN: 4.0000 mg | Freq: Four times a day (QID) | INTRAMUSCULAR | Status: DC | PRN

## 2021-03-23 MED ORDER — FENTANYL CITRATE (PF) 100 MCG/2ML IJ SOLN
INTRAMUSCULAR | Status: DC | PRN
  Administered 2021-03-23: 50 ug via INTRAVENOUS

## 2021-03-23 MED ORDER — HEPARIN BOLUS VIA INFUSION
2000.0000 [IU] | Freq: Once | INTRAVENOUS | Status: AC
  Administered 2021-03-23: 2000 [IU] via INTRAVENOUS
  Filled 2021-03-23: qty 2000

## 2021-03-23 MED ORDER — MIDAZOLAM HCL 2 MG/2ML IJ SOLN: 1.0000 mg | INTRAMUSCULAR | Status: DC | PRN

## 2021-03-23 MED ORDER — FENTANYL CITRATE (PF) 100 MCG/2ML IJ SOLN
INTRAMUSCULAR | Status: AC
  Filled 2021-03-23: qty 2

## 2021-03-23 MED ORDER — MORPHINE SULFATE (PF) 2 MG/ML IV SOLN
1.0000 mg | INTRAVENOUS | Status: DC | PRN
Start: 2021-03-23 — End: 2021-03-27
  Administered 2021-03-23 – 2021-03-25 (×16): 1 mg via INTRAVENOUS
  Filled 2021-03-23 (×16): qty 1

## 2021-03-23 MED ORDER — HEPARIN (PORCINE) 25000 UT/250ML-% IV SOLN
1100.0000 [IU]/h | INTRAVENOUS | Status: DC
  Filled 2021-03-23: qty 250

## 2021-03-23 MED ORDER — LIDOCAINE HCL (PF) 1 % IJ SOLN
INTRAMUSCULAR | Status: DC | PRN
  Administered 2021-03-23: 15 mL via INTRADERMAL

## 2021-03-23 MED ORDER — LIDOCAINE HCL (PF) 1 % IJ SOLN
INTRAMUSCULAR | Status: AC
  Filled 2021-03-23: qty 30

## 2021-03-23 MED ORDER — SODIUM CHLORIDE 0.9% FLUSH
3.0000 mL | Freq: Two times a day (BID) | INTRAVENOUS | Status: DC
  Administered 2021-03-23 – 2021-03-26 (×7): 3 mL via INTRAVENOUS

## 2021-03-23 MED ORDER — CHLORHEXIDINE GLUCONATE CLOTH 2 % EX PADS
6.0000 | MEDICATED_PAD | Freq: Every day | CUTANEOUS | Status: DC
  Administered 2021-03-23 – 2021-03-26 (×4): 6 via TOPICAL

## 2021-03-23 MED ORDER — MIDAZOLAM HCL 2 MG/2ML IJ SOLN
INTRAMUSCULAR | Status: DC | PRN
  Administered 2021-03-23: 1 mg via INTRAVENOUS

## 2021-03-23 SURGICAL SUPPLY — 10 items
BAG SNAP BAND KOVER 36X36 (MISCELLANEOUS) ×1 IMPLANT
CATH ANGIO 5F BER2 100CM (CATHETERS) ×1 IMPLANT
CATH INFUS 135CMX50CM (CATHETERS) ×2 IMPLANT
COVER DOME SNAP 22 D (MISCELLANEOUS) ×1 IMPLANT
GLIDEWIRE ANGLED SS 035X260CM (WIRE) ×1 IMPLANT
KIT MICROPUNCTURE NIT STIFF (SHEATH) ×1 IMPLANT
PROTECTION STATION PRESSURIZED (MISCELLANEOUS) ×2
SHEATH PINNACLE 5F 10CM (SHEATH) ×2 IMPLANT
STATION PROTECTION PRESSURIZED (MISCELLANEOUS) IMPLANT
TRAY PV CATH (CUSTOM PROCEDURE TRAY) ×1 IMPLANT

## 2021-03-23 NOTE — Interval H&P Note (Signed)
History and Physical Interval Note:  03/23/2021 9:49 AM  Andrea Hayes  has presented today for surgery, with the diagnosis of DVT.  The various methods of treatment have been discussed with the patient and family. After consideration of risks, benefits and other options for treatment, the patient has consented to  Procedure(s): PERIPHERAL VASCULAR THROMBECTOMY (N/A) as a surgical intervention.  We are planning to place bilateral lytic catheters today.  The patient's history has been reviewed, patient examined, no change in status, stable for surgery.  I have reviewed the patient's chart and labs.  Questions were answered to the patient's satisfaction.     Lemar Livings

## 2021-03-23 NOTE — Progress Notes (Signed)
ANTICOAGULATION CONSULT NOTE - Follow Up Consult  Pharmacy Consult for Heparin Indication: IVC thrombus + hx PE  No Known Allergies  Patient Measurements: Height: 5\' 5"  (165.1 cm) Weight: 77.1 kg (170 lb) IBW/kg (Calculated) : 57 Heparin Dosing Weight: 73 kg  Vital Signs: Temp: 99.1 F (37.3 C) (09/22 1530) Temp Source: Oral (09/22 1530) BP: 106/84 (09/22 1745) Pulse Rate: 118 (09/22 1745)  Labs: Recent Labs    03/22/21 0820 03/22/21 2020 03/23/21 0530 03/23/21 1557  HGB 11.6*  --  10.9* 11.1*  HCT 37.9  --  35.0* 36.4  PLT 238  --  217 180  LABPROT  --  12.8  --   --   INR  --  1.0  --   --   HEPARINUNFRC  --   --  0.11* <0.10*  CREATININE 0.59  --  0.62  --      Estimated Creatinine Clearance: 105.5 mL/min (by C-G formula based on SCr of 0.62 mg/dL).  Medical History: Past Medical History:  Diagnosis Date   Stroke [facility])    Assessment: 60 yoF with hx PE and cerebral thrombosis admitted with IVC thrombus. Pt is noncompliant with enoxaparin/warfarin PTA.   Pt s/p PV lab with lysis sheaths placed. Alteplase infusing at 0.5mg /h through two separate sheaths. Heparin infusing peripherally at 800 units/h, pharmacy consulted to maintain at 0.2-0.5, no bolus with lysis. -heparin level < 0.1, hg= 11.1, plt= 180, fibrinogen= 392  Goal of Therapy:  Heparin level 0.2-0.5 units/ml Monitor platelets by anticoagulation protocol: Yes   Plan:  -Alteplase 0.5mg /h via two separate infusion catheters in both lower extremities (1mg /h total) -Increase heparin to 950 units/hr -Check q6h heparin level, fibinogen, CBC -Notify MD if fibrinogen <150  26, PharmD Clinical Pharmacist **Pharmacist phone directory can now be found on [email or web address] ([email or web address]).  Listed under [facility].

## 2021-03-23 NOTE — Progress Notes (Signed)
PROGRESS NOTE    Andrea Hayes  QKM:[id number] DOB: May 13, 1991 DOA: 03/22/2021 PCP: Fleet Contras, MD   Brief Narrative: Taken from H&P. Andrea Hayes is a 30 y.o. female with history of cerebral thrombosis, idiopathic intracranial hypertension, history of CVA and recently admitted in July 2022 for pulmonary embolism on Lovenox which patient has not been very compliant has missed multiple doses presents to the ER with complaints of flank and back pain and epigastric pain for the last 2 weeks.   On arrival she was found to have mild leukocytosis, abnormal liver enzymes, CT abdomen and pelvis with occlusive thrombosis involving infrarenal IVC, bilateral common iliac, right external iliac. Vascular surgery was consulted and she was started on heparin infusion, she was taken to the OR for thrombectomy and also given tPA.  Patient had 2 admissions earlier this year 1 in July 2022 with PE and 1 and May 2022 for intracerebral thrombosis.Hypercoagulable work-up was unremarkable including  homocystein 4.8, antithrombin III mildly elevated at 128, low protein S activity of 22, total protein S 109, protein C activity 180, total protein C 127, lupus anticoagulant 25.3, DRVVT 44.7, beta-2-glycoprotein antibodies <9, factor V leiden mutation negative, prothrombin gene mutation negative, cardiolipin antibodies negative, sickle cell screen negative.She was discharged on Coumadin with Lovenox bridge, doubt she was taking it as INR was 1 on admission.  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  Subjective: Patient was seen after the thrombectomy today.  Denies any pain or shortness of breath.  She was still on TPN through bilateral catheters as planned by vascular surgery. Boyfriend at bedside.  Not taking her medications at home.  Assessment & Plan:   Principal Problem:   IVC thrombosis (HCC) Active Problems:   Idiopathic intracranial  hypertension   Elevated liver enzymes   DVT (deep venous thrombosis) (HCC)  IVC thrombosis.  Patient had extensive intravenous thrombosis, involving infrarenal IVC, bilateral common iliac, right external iliac, extending up to the level of hepatic vein, s/p thrombectomy and thrombolysis.  History of idiopathic thrombosis, negative hypercoagulable work-up done in May 2022. Currently on tPA-being managed by vascular surgery in ICU. -Continue to monitor -We will discuss with vascular if she can be discharged on NOAC for monitoring and compliance issues. -We will need extensive counseling and education in. -Should see an hematologist as an outpatient for further work-up.  Elevated liver enzymes.  Seems to have chronically elevated liver enzymes with some improvement as compared to prior during recent admission in July 2022.  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  Acute hepatitis panel was negative at that time. -Monitor renal function -Avoid hepatotoxins.  History of idiopathic intracranial hypertension.  No acute concern or headaches.  Takes acetazolamide as needed. -Can continue acetazolamide as needed. -Need to have a close follow-up with neurology as an outpatient  Normocytic anemia. -Check anemia panel -Continue to monitor   Objective: Vitals:   03/23/21 0900 03/23/21 1128 03/23/21 1130 03/23/21 1145  BP: 110/84 (!) 130/93 130/89 121/82  Pulse: (!) 113 (!) 106 (!) 112 (!) 110  Resp: 20 (!) 23 (!) 21 17  Temp:   99 F (37.2 C)   TempSrc:   Oral   SpO2: 97% 99% 99% 100%  Weight:      Height:        Intake/Output Summary (Last 24 hours) at 03/23/2021 1349 Last data  filed at 03/23/2021 0901 Gross per 24 hour  Intake 172.63 ml  Output --  Net 172.63 ml   Filed Weights   03/22/21 2342  Weight: 77.1 kg    Examination:  General exam: Appears calm and comfortable  Respiratory system: Clear to  auscultation. Respiratory effort normal. Cardiovascular system: S1 & S2 heard, RRR.  Gastrointestinal system: Soft, nontender, nondistended, bowel sounds positive. Central nervous system: Alert and oriented. No focal neurological deficits.Symmetric 5 x 5 power. Extremities: No edema, no cyanosis, pulses intact and symmetrical. Psychiatry: Judgement and insight appear normal.   DVT prophylaxis:  Code Status: Full Family Communication: Discussed with patient and boyfriend at bedside Disposition Plan:  Status is: Inpatient  Remains inpatient appropriate because:Inpatient level of care appropriate due to severity of illness  Dispo: The patient is from: Home              Anticipated d/c is to: Home              Patient currently is not medically stable to d/c.   Difficult to place patient No              Level of care: ICU  All the records are reviewed and case discussed with Care Management/Social Worker. Management plans discussed with the patient, nursing and they are in agreement.  Consultants:  Vascular surgery  Procedures:  Antimicrobials:   Data Reviewed: I have personally reviewed following labs and imaging studies  CBC: Recent Labs  Lab 03/22/21 0820 03/23/21 0530  WBC 12.5* 12.6*  NEUTROABS  --  9.9*  HGB 11.6* 10.9*  HCT 37.9 35.0*  MCV 87.7 87.1  PLT 238 217   Basic Metabolic Panel: Recent Labs  Lab 03/22/21 0820 03/23/21 0530  NA 134* 135  K 4.5 3.9  CL 99 101  CO2 25 24  GLUCOSE 118* 100*  BUN 7 5*  CREATININE 0.59 0.62  CALCIUM 9.6 8.9   GFR: Estimated Creatinine Clearance: 105.5 mL/min (by C-G formula based on SCr of 0.62 mg/dL). Liver Function Tests: Recent Labs  Lab 03/22/21 0820 03/23/21 0530  AST 168* 63*  ALT 169* 105*  ALKPHOS 282* 248*  BILITOT 0.6 0.6  PROT 8.2* 7.5  ALBUMIN 3.1* 2.5*   Recent Labs  Lab 03/22/21 0820  LIPASE 29   No results for input(s): AMMONIA in the last 168 hours. Coagulation Profile: Recent Labs   Lab 03/22/21 2020  INR 1.0   Cardiac Enzymes: No results for input(s): CKTOTAL, CKMB, CKMBINDEX, TROPONINI in the last 168 hours. BNP (last 3 results) No results for input(s): PROBNP in the last 8760 hours. HbA1C: No results for input(s): HGBA1C in the last 72 hours. CBG: Recent Labs  Lab 03/23/21 0235 03/23/21 1155  GLUCAP 85 98   Lipid Profile: No results for input(s): CHOL, HDL, LDLCALC, TRIG, CHOLHDL, LDLDIRECT in the last 72 hours. Thyroid Function Tests: No results for input(s): TSH, T4TOTAL, FREET4, T3FREE, THYROIDAB in the last 72 hours. Anemia Panel: Recent Labs    03/23/21 1202  RETICCTPCT 1.6   Sepsis Labs: No results for input(s): PROCALCITON, LATICACIDVEN in the last 168 hours.  Recent Results (from the past 240 hour(s))  Resp Panel by RT-PCR (Flu A&B, Covid) Nasopharyngeal Swab     Status: None   Collection Time: 03/22/21 10:08 PM   Specimen: Nasopharyngeal Swab; Nasopharyngeal(NP) swabs in vial transport medium  Result Value Ref Range Status   SARS Coronavirus 2 by RT PCR NEGATIVE NEGATIVE Final    Comment: (  NOTE) SARS-CoV-2 target nucleic acids are NOT DETECTED.  The SARS-CoV-2 RNA is generally detectable in upper respiratory specimens during the acute phase of infection. The lowest concentration of SARS-CoV-2 viral copies this assay can detect is 138 copies/mL. A negative result does not preclude SARS-Cov-2 infection and should not be used as the sole basis for treatment or other patient management decisions. A negative result may occur with  improper specimen collection/handling, submission of specimen other than nasopharyngeal swab, presence of viral mutation(s) within the areas targeted by this assay, and inadequate number of viral copies(<138 copies/mL). A negative result must be combined with clinical observations, patient history, and epidemiological information. The expected result is Negative.  Fact Sheet for Patients:   BloggerCourse.com  Fact Sheet for Healthcare Providers:  SeriousBroker.it  This test is no t yet approved or cleared by the [place] FDA and  has been authorized for detection and/or diagnosis of SARS-CoV-2 by FDA under an Emergency Use Authorization (EUA). This EUA will remain  in effect (meaning this test can be used) for the duration of the COVID-19 declaration under Section 564(b)(1) of the Act, 21 U.S.C.section 360bbb-3(b)(1), unless the authorization is terminated  or revoked sooner.       Influenza A by PCR NEGATIVE NEGATIVE Final   Influenza B by PCR NEGATIVE NEGATIVE Final    Comment: (NOTE) The Xpert Xpress SARS-CoV-2/FLU/RSV plus assay is intended as an aid in the diagnosis of influenza from Nasopharyngeal swab specimens and should not be used as a sole basis for treatment. Nasal washings and aspirates are unacceptable for Xpert Xpress SARS-CoV-2/FLU/RSV testing.  Fact Sheet for Patients: BloggerCourse.com  Fact Sheet for Healthcare Providers: SeriousBroker.it  This test is not yet approved or cleared by the [place] FDA and has been authorized for detection and/or diagnosis of SARS-CoV-2 by FDA under an Emergency Use Authorization (EUA). This EUA will remain in effect (meaning this test can be used) for the duration of the COVID-19 declaration under Section 564(b)(1) of the Act, 21 U.S.C. section 360bbb-3(b)(1), unless the authorization is terminated or revoked.  Performed at [facility], [address] [place], [place] [address]      Radiology Studies: CT Abdomen Pelvis W Contrast  Result Date: 03/22/2021 CLINICAL DATA:  Abdominal infection suspected. EXAM: CT ABDOMEN AND PELVIS WITH CONTRAST TECHNIQUE: Multidetector CT imaging of the abdomen and pelvis was performed using the standard protocol following bolus administration of intravenous  contrast. CONTRAST:  52mL OMNIPAQUE IOHEXOL 350 MG/ML SOLN COMPARISON:  CT abdomen and pelvis 07/13/2017. FINDINGS: Lower chest: There is a trace right pleural effusion. Hepatobiliary: No focal liver abnormality is seen. No gallstones, gallbladder wall thickening, or biliary dilatation. Pancreas: Unremarkable. No pancreatic ductal dilatation or surrounding inflammatory changes. Spleen: Normal in size without focal abnormality. Adrenals/Urinary Tract: Adrenal glands are unremarkable. Kidneys are normal, without renal calculi, focal lesion, or hydronephrosis. Bladder is unremarkable. Stomach/Bowel: Stomach is within normal limits. Appendix appears normal. No evidence of bowel wall thickening, distention, or inflammatory changes. Vascular/Lymphatic: The aorta appears within normal limits. There is occlusive thrombus throughout the entire infrarenal IVC. There is nonocclusive thrombus in the IVC at the level of the renal arteries and within the intrahepatic IVC. Opacification of the iliac veins is poor, but there are findings suspicious for occlusive thrombus within the right common and external iliac vein and possibly the left common iliac vein. There are prominent bilateral inguinal lymph nodes. Reproductive: Uterus is slightly prominent in size. There is a 2.8 cm cyst  in the left ovary. Right adnexa is unremarkable. Other: There is presacral edema as well as diffuse retroperitoneal edema surrounding the IVC. There is no ascites or free intraperitoneal air. Subcutaneous densities in the anterior abdominal wall are likely related to medication injection sites. Musculoskeletal: No acute or significant osseous findings. IMPRESSION: 1. Occlusive thrombus throughout the infrarenal IVC. Nonocclusive thrombus in the upper abdominal IVC. Findings highly suspicious for bilateral common iliac vein occlusive thrombus and right external iliac vein thrombus. 2. Presacral and retroperitoneal edema likely secondary to venous  congestion. Infectious or inflammatory process cannot be excluded. 3. 2.8 cm left ovarian simple-appearing cyst. No follow-up imaging is recommended. Reference: JACR 2020 Feb;17(2):248-254 4. Trace right pleural effusion. 5. These results were called by telephone at the time of interpretation on 03/22/2021 at 10:01 pm to provider Dr. Hyacinth Meeker, who verbally acknowledged these results. Electronically Signed   By: Darliss Cheney M.D.   On: 03/22/2021 22:01   PERIPHERAL VASCULAR CATHETERIZATION  Result Date: 03/23/2021 Images from the original result were not included. Patient name: Andrea Hayes MRN: [id number] DOB: 05-23-1991 Sex: female 03/23/2021 Pre-operative Diagnosis: IVC, bilateral common iliac, bilateral external iliac and bilateral common femoral vein DVT Post-operative diagnosis:  Same Surgeon:  Luanna Salk. Randie Heinz, MD Procedure Performed: 1.  Ultrasound-guided cannulation bilateral popliteal veins 2.  IVC venogram 3.  Placement of IVC bilateral common iliac vein, bilateral external iliac vein bilateral common femoral vein and bilateral femoral vein 50 cm treatment length UniFuse catheters 4.  Moderate sedation with fentanyl and Versed for 35 minutes Indications: 29 year old female presented with abdominal pain was found to have extensive DVT from her IVC extending down to her bilateral common femoral veins.  Also has bilateral lower extremity swelling.  She is indicated for lytic catheter placement. Findings: Bilateral popliteal veins were distended but they were compressible.  The did have bleeding when the micropuncture sheath was placed.  By central venogram there did appear to be thrombus extending up to the level of the hepatic veins.  There was difficulty placing a wire past the bilateral common iliac veins into the IVC confluence this required catheter direction.  Bilateral UniFuse catheters were placed that were 50 cm treatment length from the distal IVC down to the mid femoral veins.  Procedure:  The  patient was identified in the holding area and taken to room 8.  The patient was then placed prone on the table and prepped and draped in the usual sterile fashion.  A time out was called.  Ultrasound was used to evaluate first the left popliteal vein.  This was large but was compressible.  There is no spasm facility again cannulated with micropuncture needle followed by wire and sheath.  We then placed a Glidewire.  A 5 French sheath was placed.  Fluoroscopically we advanced the Glidewire to the level of the left common iliac vein.  A Berenstein catheter was placed.  Was directed to the IVC.  At the terminal IVC we performed bleeding venogram which demonstrated thrombus to the level of the hepatic veins.  We placed UniFuse catheter as well as the occluder wire.  We turned our attention to the right leg.  Ultrasound used to identify the popliteal vein on that side.  This also was enlarged although somewhat compressible.  The area again was anesthetized 1% lidocaine cannulated micropuncture needle followed by wire and sheath.  Glidewire was placed followed by 5 [place] sheath.  Again the Glidewire did hang up at the common iliac vein on the  right.  We used Berenstein catheter directed the IVC and again perform IVC venogram at the terminal IVC to confirm intraluminal access.  The UniFuse catheter was placed with the occluded wire.  Single shot images were taken centrally and peripherally to demonstrate the placement of the catheter.  Both catheters were flushed.  They will be helped tPA.  She tolerated procedure without any complication. Contrast: 20 cc Brandon C. Randie Heinz, MD [facility] Office: [phone] Pager: [phone]    Scheduled Meds:  Chlorhexidine Gluconate Cloth  6 each Topical Daily   sodium chloride flush  3 mL Intravenous Q12H   Continuous Infusions:  sodium chloride Stopped (03/23/21 0014)   sodium chloride     alteplase (LIMB ISCHEMIA) 10 mg in normal saline  (0.02 mg/mL) infusion 0.5 mg/hr (03/23/21 1100)   alteplase (LIMB ISCHEMIA) 10 mg in normal saline (0.02 mg/mL) infusion 0.5 mg/hr (03/23/21 1100)   heparin 800 Units/hr (03/23/21 1045)     LOS: 1 day   Time spent: 45 minutes More than 50% of the time was spent in counseling/coordination of care  Arnetha Courser, MD [facility]  If 7PM-7AM, please contact night-coverage [email or web address]  03/23/2021, 1:49 PM   This record has been created using Conservation officer, historic buildings. Errors have been sought and corrected,but may not always be located. Such creation errors do not reflect on the standard of care.

## 2021-03-23 NOTE — H&P (View-Only) (Signed)
[facility]  Patient name: Andrea Hayes MRN: [id number] DOB: 10/09/1990 Sex: female   REQUESTING PROVIDER:    ER   REASON FOR CONSULT:    IVC thrombus  HISTORY OF PRESENT ILLNESS:   Andrea Hayes is a 30 y.o. female, who presented to tthe ER with back and abdominal pain.  Her workup revealed a IVC thrombus with occlusion and clot extending above her renal veins.  Her LFTs were elevated and creatinine were normal.  She has a history of IVC thrombus and PE a few months ago.  She has not been taking her anticoagulation.  She also has a history of cerebral thrombosis.  She is 4 months post partum from vaginal delivery.  PAST MEDICAL HISTORY    Past Medical History:  Diagnosis Date   Stroke [facility])      FAMILY HISTORY   Family History  Problem Relation Age of Onset   Asthma Father    Obesity Neg Hx    Hypertension Neg Hx    Diabetes Neg Hx    Cancer Neg Hx     SOCIAL HISTORY:   Social History   Socioeconomic History   Marital status: Single    Spouse name: Not on file   Number of children: Not on file   Years of education: Not on file   Highest education level: Not on file  Occupational History   Not on file  Tobacco Use   Smoking status: Never   Smokeless tobacco: Never  Vaping Use   Vaping Use: Never used  Substance and Sexual Activity   Alcohol use: No    Alcohol/week: 0.0 standard drinks   Drug use: No   Sexual activity: Yes    Birth control/protection: None  Other Topics Concern   Not on file  Social History Narrative   Right handed   Social Determinants of Health   Financial Resource Strain: Not on file  Food Insecurity: Not on file  Transportation Needs: Not on file  Physical Activity: Not on file  Stress: Not on file  Social Connections: Not on file  Intimate Partner Violence: Not on file    ALLERGIES:    No Known Allergies  CURRENT MEDICATIONS:    Current  Facility-Administered Medications  Medication Dose Route Frequency Provider Last Rate Last Admin   0.9 %  sodium chloride infusion   Intravenous Continuous Eduard Clos, MD 75 mL/hr at 03/23/21 0013 New Bag at 03/23/21 0013   heparin ADULT infusion 100 units/mL (25000 units/224mL)  1,500 Units/hr Intravenous Continuous Arnetha Courser, MD 13 mL/hr at 03/23/21 0008 1,300 Units/hr at 03/23/21 0008   heparin bolus via infusion 2,000 Units  2,000 Units Intravenous Once Arnetha Courser, MD       morphine 2 MG/ML injection 1 mg  1 mg Intravenous Q2H PRN Eduard Clos, MD   1 mg at 03/23/21 0220   Current Outpatient Medications  Medication Sig Dispense Refill   acetaZOLAMIDE (DIAMOX) 125 MG tablet Take 2 tablets (250 mg total) by mouth 2 (two) times daily. (Patient taking differently: Take 250 mg by mouth 2 (two) times daily as needed (altitude sickness).) 120 tablet 5   enoxaparin (LOVENOX) 80 MG/0.8ML injection Inject 1 syringe (80 mg total) into the skin 2 (two) times daily for 5 doses. 4 mL 0   Prenatal Vit-Fe Fumarate-FA (PRENATAL VITAMIN) 27-0.8 MG TABS Take 1 tablet by mouth daily. 30 tablet 12   Vitamin D, Ergocalciferol, (DRISDOL) 1.25 MG (50000 UNIT)  CAPS capsule Take 50,000 Units by mouth once a week.     acetaminophen (TYLENOL) 325 MG tablet Take 2 tablets (650 mg total) by mouth every 6 (six) hours as needed for mild pain or headache (do not take over 3000 mg in a day of tylenol. please note there is tylenol in the fiorecet). (Patient not taking: Reported on 03/22/2021)     warfarin (COUMADIN) 7.5 MG tablet Take 1 tablet (7.5 mg total) by mouth daily for 10 days. (Patient not taking: Reported on 03/22/2021) 10 tablet 0    REVIEW OF SYSTEMS:   [X] denotes positive finding, [ ] denotes negative finding Cardiac  Comments:  Chest pain or chest pressure:    Shortness of breath upon exertion:    Short of breath when lying flat:    Irregular heart rhythm:        Vascular    Pain in  calf, thigh, or hip brought on by ambulation:    Pain in feet at night that wakes you up from your sleep:     Blood clot in your veins:    Leg swelling:  x       Pulmonary    Oxygen at home:    Productive cough:     Wheezing:         Neurologic    Sudden weakness in arms or legs:     Sudden numbness in arms or legs:     Sudden onset of difficulty speaking or slurred speech:    Temporary loss of vision in one eye:     Problems with dizziness:         Gastrointestinal    Blood in stool:      Vomited blood:         Genitourinary    Burning when urinating:     Blood in urine:        Psychiatric    Major depression:         Hematologic    Bleeding problems:    Problems with blood clotting too easily:        Skin    Rashes or ulcers:        Constitutional    Fever or chills:     PHYSICAL EXAM:   Vitals:   03/23/21 0400 03/23/21 0420 03/23/21 0500 03/23/21 0600  BP: (!) 91/53 102/60 97/82 106/71  Pulse: (!) 110 (!) 108 (!) 120 (!) 118  Resp: (!) 21 20 (!) 27 (!) 21  Temp:      TempSrc:      SpO2: 95% 96% 98% 96%  Weight:      Height:        GENERAL: The patient is a well-nourished female, in no acute distress. The vital signs are documented above. CARDIAC: There is a regular rate and rhythm.  VASCULAR: bilateral edema PULMONARY: Nonlabored respirations ABDOMEN: Soft  MUSCULOSKELETAL: There are no major deformities or cyanosis. NEUROLOGIC: No focal weakness or paresthesias are detected. SKIN: There are no ulcers or rashes noted. PSYCHIATRIC: The patient has a normal affect.  STUDIES:   I have reviewed her CT scan with the following findings: 1. Occlusive thrombus throughout the infrarenal IVC. Nonocclusive thrombus in the upper abdominal IVC. Findings highly suspicious for bilateral common iliac vein occlusive thrombus and right external iliac vein thrombus. 2. Presacral and retroperitoneal edema likely secondary to venous congestion. Infectious or  inflammatory process cannot be excluded. 3. 2.8 cm left ovarian simple-appearing cyst. No follow-up imaging is   recommended. Reference: JACR 2020 Feb;17(2):248-254 4. Trace right pleural effusion. 5. These results were called by telephone at the time of interpretation on 03/22/2021 at 10:01 pm to provider Dr. Miller, who verbally acknowledged these results.  ASSESSMENT and PLAN   IVC thrombus:  I discussed proceeding with placing bilateral popliteal catheters for lysis and then returning the next day for further attempts at thrombectomy.  We discussed potential complication including renal dysfunction, hepatic congestion, PE, and intracranial bleeding. She had a Brain MRI several months ago without any acute abnormalities.  I told her to stop breat feeding until she is discharged.   Wells Mignonne Afonso, IV, MD, FACS [facility] [place] Tel [phone] Pager [phone]  

## 2021-03-23 NOTE — Progress Notes (Signed)
ANTICOAGULATION CONSULT NOTE - Follow Up Consult  Pharmacy Consult for Heparin Indication: IVC thrombus + hx PE  No Known Allergies  Patient Measurements: Height: 5\' 5"  (165.1 cm) Weight: 77.1 kg (170 lb) IBW/kg (Calculated) : 57 Heparin Dosing Weight: 73 kg  Vital Signs: Temp: 99.1 F (37.3 C) (09/22 0305) Temp Source: Oral (09/22 0305) BP: 110/84 (09/22 0900) Pulse Rate: 113 (09/22 0900)  Labs: Recent Labs    03/22/21 0820 03/22/21 2020 03/23/21 0530  HGB 11.6*  --  10.9*  HCT 37.9  --  35.0*  PLT 238  --  217  LABPROT  --  12.8  --   INR  --  1.0  --   HEPARINUNFRC  --   --  0.11*  CREATININE 0.59  --  0.62     Estimated Creatinine Clearance: 105.5 mL/min (by C-G formula based on SCr of 0.62 mg/dL).  Medical History: Past Medical History:  Diagnosis Date   Stroke [facility])    Assessment: 18 yoF with hx PE and cerebral thrombosis admitted with IVC thrombus. Pt is noncompliant with enoxaparin/warfarin PTA.   Pt s/p PV lab with lysis sheaths placed. Alteplase infusing at 0.5mg /h through two separate sheaths. Heparin started peripherally at 800 units/h, pharmacy consulted to maintain at 0.2-0.5, no bolus with lysis.  Goal of Therapy:  Heparin level 0.2-0.5 units/ml Monitor platelets by anticoagulation protocol: Yes   Plan:  -Alteplase 0.5mg /h via two separate infusion catheters in both lower extremities (1mg /h total) -Heparin 800 units/h peripherally for now -Check q6h heparin level, fibinogen, CBC -Notify MD if fibrinogen <150  26, PharmD, BCPS, [facility] Clinical Pharmacist [phone] Please check AMION for all [facility] Pharmacy numbers 03/23/2021

## 2021-03-23 NOTE — ED Notes (Signed)
To cath lab via stretcher.

## 2021-03-23 NOTE — Op Note (Signed)
    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 Randie Heinz, MD Procedure Performed: 1.  Ultrasound-guided cannulation bilateral popliteal veins 2.  IVC venogram 3.  Placement of IVC bilateral common iliac vein, bilateral external iliac vein bilateral common femoral vein and bilateral femoral vein 50 cm treatment length UniFuse catheters 4.  Moderate sedation with fentanyl and Versed for 35 minutes  Indications: 30 year old female presented with abdominal pain was found to have extensive DVT from her IVC extending down to her bilateral common femoral veins.  Also has bilateral lower extremity swelling.  She is indicated for lytic catheter placement.  Findings: Bilateral popliteal veins were distended but they were compressible.  The did have bleeding when the micropuncture sheath was placed.  By central venogram there did appear to be thrombus extending up to the level of the hepatic veins.  There was difficulty placing a wire past the bilateral common iliac veins into the IVC confluence this required catheter direction.  Bilateral UniFuse catheters were placed that were 50 cm treatment length from the distal IVC down to the mid femoral veins.   Procedure:  The patient was identified in the holding area and taken to room 8.  The patient was then placed prone on the table and prepped and draped in the usual sterile fashion.  A time out was called.  Ultrasound was used to evaluate first the left popliteal vein.  This was large but was compressible.  There is no spasm facility again cannulated with micropuncture needle followed by wire and sheath.  We then placed a Glidewire.  A 5 French sheath was placed.  Fluoroscopically we advanced the Glidewire to the level of the left common iliac vein.  A  Berenstein catheter was placed.  Was directed to the IVC.  At the terminal IVC we performed bleeding venogram which demonstrated thrombus to the level of the hepatic veins.  We placed UniFuse catheter as well as the occluder wire.  We turned our attention to the right leg.  Ultrasound used to identify the popliteal vein on that side.  This also was enlarged although somewhat compressible.  The area again was anesthetized 1% lidocaine cannulated micropuncture needle followed by wire and sheath.  Glidewire was placed followed by 5 [place] sheath.  Again the Glidewire did hang up at the common iliac vein on the right.  We used Berenstein catheter directed the IVC and again perform IVC venogram at the terminal IVC to confirm intraluminal access.  The UniFuse catheter was placed with the occluded wire.  Single shot images were taken centrally and peripherally to demonstrate the placement of the catheter.  Both catheters were flushed.  They will be helped tPA.  She tolerated procedure without any complication.  Contrast: 20 cc   Markeese Boyajian C. Randie Heinz, MD [facility] [place] Office: [phone] Pager: [phone]

## 2021-03-23 NOTE — Progress Notes (Signed)
ANTICOAGULATION CONSULT NOTE - Follow Up Consult  Pharmacy Consult for Heparin Indication:  IVC Infrarenal  No Known Allergies  Patient Measurements: Height: 5\' 5"  (165.1 cm) Weight: 77.1 kg (170 lb) IBW/kg (Calculated) : 57 Heparin Dosing Weight: 73 kg  Vital Signs: Temp: 99.1 F (37.3 C) (09/22 0305) Temp Source: Oral (09/22 0305) BP: 106/71 (09/22 0600) Pulse Rate: 118 (09/22 0600)  Labs: Recent Labs    03/22/21 0820 03/22/21 2020 03/23/21 0530  HGB 11.6*  --  10.9*  HCT 37.9  --  35.0*  PLT 238  --  217  LABPROT  --  12.8  --   INR  --  1.0  --   HEPARINUNFRC  --   --  0.11*  CREATININE 0.59  --  0.62     Estimated Creatinine Clearance: 105.5 mL/min (by C-G formula based on SCr of 0.62 mg/dL).  Medical History: Past Medical History:  Diagnosis Date   Stroke [facility])    Medications: see MAR  Assessment: 73 yof with a history of intracranial HTN, right arterial ischemic stroke, MCA, hx of PE, supposed to be on Lovenox at home--there is a question of medical noncompliance. Patient is presenting with flank/abd pain. Found to have occlusive thrombus throughout the infrarenal IVC and findings suspicious for bilateral common iliac vein occlusive thrombus and right external iliac vein thrombus  Heparin per pharmacy consult placed for  IVC infrarenal .  Patient reportedly supposed to be on enoxaparin PTA. INR on arrival 1.  Hgb 11.6 > 10.9 today, PLTc @ bsl.   Goal of Therapy:  Heparin level 0.3-0.7 units/ml Monitor platelets by anticoagulation protocol: Yes   Plan: Give 2000 units bolus x 1 Increase heparin infusion to 1500 units/hr (~3u/kg/hr increase from previous dose) Check anti-Xa level in 6 hours and daily while on heparin Continue to monitor H&H and platelets  26, PharmD, [facility] Emergency Medicine Clinical Pharmacist ED RPh Phone: [phone] Main RX: [phone]

## 2021-03-23 NOTE — Consult Note (Signed)
[facility]  Patient name: Andrea Hayes MRN: [id number] DOB: 10/09/1990 Sex: female   REQUESTING PROVIDER:    ER   REASON FOR CONSULT:    IVC thrombus  HISTORY OF PRESENT ILLNESS:   Andrea Hayes is a 30 y.o. female, who presented to tthe ER with back and abdominal pain.  Her workup revealed a IVC thrombus with occlusion and clot extending above her renal veins.  Her LFTs were elevated and creatinine were normal.  She has a history of IVC thrombus and PE a few months ago.  She has not been taking her anticoagulation.  She also has a history of cerebral thrombosis.  She is 4 months post partum from vaginal delivery.  PAST MEDICAL HISTORY    Past Medical History:  Diagnosis Date   Stroke [facility])      FAMILY HISTORY   Family History  Problem Relation Age of Onset   Asthma Father    Obesity Neg Hx    Hypertension Neg Hx    Diabetes Neg Hx    Cancer Neg Hx     SOCIAL HISTORY:   Social History   Socioeconomic History   Marital status: Single    Spouse name: Not on file   Number of children: Not on file   Years of education: Not on file   Highest education level: Not on file  Occupational History   Not on file  Tobacco Use   Smoking status: Never   Smokeless tobacco: Never  Vaping Use   Vaping Use: Never used  Substance and Sexual Activity   Alcohol use: No    Alcohol/week: 0.0 standard drinks   Drug use: No   Sexual activity: Yes    Birth control/protection: None  Other Topics Concern   Not on file  Social History Narrative   Right handed   Social Determinants of Health   Financial Resource Strain: Not on file  Food Insecurity: Not on file  Transportation Needs: Not on file  Physical Activity: Not on file  Stress: Not on file  Social Connections: Not on file  Intimate Partner Violence: Not on file    ALLERGIES:    No Known Allergies  CURRENT MEDICATIONS:    Current  Facility-Administered Medications  Medication Dose Route Frequency Provider Last Rate Last Admin   0.9 %  sodium chloride infusion   Intravenous Continuous Eduard Clos, MD 75 mL/hr at 03/23/21 0013 New Bag at 03/23/21 0013   heparin ADULT infusion 100 units/mL (25000 units/224mL)  1,500 Units/hr Intravenous Continuous Arnetha Courser, MD 13 mL/hr at 03/23/21 0008 1,300 Units/hr at 03/23/21 0008   heparin bolus via infusion 2,000 Units  2,000 Units Intravenous Once Arnetha Courser, MD       morphine 2 MG/ML injection 1 mg  1 mg Intravenous Q2H PRN Eduard Clos, MD   1 mg at 03/23/21 0220   Current Outpatient Medications  Medication Sig Dispense Refill   acetaZOLAMIDE (DIAMOX) 125 MG tablet Take 2 tablets (250 mg total) by mouth 2 (two) times daily. (Patient taking differently: Take 250 mg by mouth 2 (two) times daily as needed (altitude sickness).) 120 tablet 5   enoxaparin (LOVENOX) 80 MG/0.8ML injection Inject 1 syringe (80 mg total) into the skin 2 (two) times daily for 5 doses. 4 mL 0   Prenatal Vit-Fe Fumarate-FA (PRENATAL VITAMIN) 27-0.8 MG TABS Take 1 tablet by mouth daily. 30 tablet 12   Vitamin D, Ergocalciferol, (DRISDOL) 1.25 MG (50000 UNIT)  CAPS capsule Take 50,000 Units by mouth once a week.     acetaminophen (TYLENOL) 325 MG tablet Take 2 tablets (650 mg total) by mouth every 6 (six) hours as needed for mild pain or headache (do not take over 3000 mg in a day of tylenol. please note there is tylenol in the fiorecet). (Patient not taking: Reported on 03/22/2021)     warfarin (COUMADIN) 7.5 MG tablet Take 1 tablet (7.5 mg total) by mouth daily for 10 days. (Patient not taking: Reported on 03/22/2021) 10 tablet 0    REVIEW OF SYSTEMS:   [X]  denotes positive finding, [ ]  denotes negative finding Cardiac  Comments:  Chest pain or chest pressure:    Shortness of breath upon exertion:    Short of breath when lying flat:    Irregular heart rhythm:        Vascular    Pain in  calf, thigh, or hip brought on by ambulation:    Pain in feet at night that wakes you up from your sleep:     Blood clot in your veins:    Leg swelling:  x       Pulmonary    Oxygen at home:    Productive cough:     Wheezing:         Neurologic    Sudden weakness in arms or legs:     Sudden numbness in arms or legs:     Sudden onset of difficulty speaking or slurred speech:    Temporary loss of vision in one eye:     Problems with dizziness:         Gastrointestinal    Blood in stool:      Vomited blood:         Genitourinary    Burning when urinating:     Blood in urine:        Psychiatric    Major depression:         Hematologic    Bleeding problems:    Problems with blood clotting too easily:        Skin    Rashes or ulcers:        Constitutional    Fever or chills:     PHYSICAL EXAM:   Vitals:   03/23/21 0400 03/23/21 0420 03/23/21 0500 03/23/21 0600  BP: (!) 91/53 102/60 97/82 106/71  Pulse: (!) 110 (!) 108 (!) 120 (!) 118  Resp: (!) 21 20 (!) 27 (!) 21  Temp:      TempSrc:      SpO2: 95% 96% 98% 96%  Weight:      Height:        GENERAL: The patient is a well-nourished female, in no acute distress. The vital signs are documented above. CARDIAC: There is a regular rate and rhythm.  VASCULAR: bilateral edema PULMONARY: Nonlabored respirations ABDOMEN: Soft  MUSCULOSKELETAL: There are no major deformities or cyanosis. NEUROLOGIC: No focal weakness or paresthesias are detected. SKIN: There are no ulcers or rashes noted. PSYCHIATRIC: The patient has a normal affect.  STUDIES:   I have reviewed her CT scan with the following findings: 1. Occlusive thrombus throughout the infrarenal IVC. Nonocclusive thrombus in the upper abdominal IVC. Findings highly suspicious for bilateral common iliac vein occlusive thrombus and right external iliac vein thrombus. 2. Presacral and retroperitoneal edema likely secondary to venous congestion. Infectious or  inflammatory process cannot be excluded. 3. 2.8 cm left ovarian simple-appearing cyst. No follow-up imaging is  recommended. Reference: JACR 2020 Feb;17(2):248-254 4. Trace right pleural effusion. 5. These results were called by telephone at the time of interpretation on 03/22/2021 at 10:01 pm to provider Dr. Hyacinth Meeker, who verbally acknowledged these results.  ASSESSMENT and PLAN   IVC thrombus:  I discussed proceeding with placing bilateral popliteal catheters for lysis and then returning the next day for further attempts at thrombectomy.  We discussed potential complication including renal dysfunction, hepatic congestion, PE, and intracranial bleeding. She had a Brain MRI several months ago without any acute abnormalities.  I told her to stop breat feeding until she is discharged.   Charlena Cross, MD, FACS [facility] [phone] Pager [phone]

## 2021-03-24 ENCOUNTER — Encounter (HOSPITAL_COMMUNITY): Payer: Self-pay | Admitting: Vascular Surgery

## 2021-03-24 ENCOUNTER — Encounter (HOSPITAL_COMMUNITY)
Admission: EM | Disposition: A | Discharge status: Home / Self Care | Payer: Self-pay | Source: Home / Self Care | Attending: Internal Medicine

## 2021-03-24 DIAGNOSIS — I745 Embolism and thrombosis of iliac artery: Secondary | ICD-10-CM

## 2021-03-24 DIAGNOSIS — I82409 Acute embolism and thrombosis of unspecified deep veins of unspecified lower extremity: Secondary | ICD-10-CM

## 2021-03-24 DIAGNOSIS — I82220 Acute embolism and thrombosis of inferior vena cava: Secondary | ICD-10-CM

## 2021-03-24 HISTORY — PX: PERIPHERAL VASCULAR THROMBECTOMY: CATH118306

## 2021-03-24 HISTORY — PX: PERIPHERAL VASCULAR BALLOON ANGIOPLASTY: CATH118281

## 2021-03-24 LAB — CBC
HCT: 36.9 % (ref 36.0–46.0)
HCT: 37.5 % (ref 36.0–46.0)
Hemoglobin: 11.5 g/dL — ABNORMAL LOW (ref 12.0–15.0)
Hemoglobin: 12 g/dL (ref 12.0–15.0)
MCH: 27 pg (ref 26.0–34.0)
MCH: 27.5 pg (ref 26.0–34.0)
MCHC: 31.2 g/dL (ref 30.0–36.0)
MCHC: 32 g/dL (ref 30.0–36.0)
MCV: 86 fL (ref 80.0–100.0)
MCV: 86.6 fL (ref 80.0–100.0)
Platelets: 164 10*3/uL (ref 150–400)
Platelets: 168 10*3/uL (ref 150–400)
RBC: 4.26 MIL/uL (ref 3.87–5.11)
RBC: 4.36 MIL/uL (ref 3.87–5.11)
RDW: 14.1 % (ref 11.5–15.5)
RDW: 14.1 % (ref 11.5–15.5)
WBC: 12.5 10*3/uL — ABNORMAL HIGH (ref 4.0–10.5)
WBC: 14 10*3/uL — ABNORMAL HIGH (ref 4.0–10.5)
nRBC: 0 % (ref 0.0–0.2)
nRBC: 0 % (ref 0.0–0.2)

## 2021-03-24 LAB — CBC WITH DIFFERENTIAL/PLATELET
Abs Immature Granulocytes: 0.11 10*3/uL — ABNORMAL HIGH (ref 0.00–0.07)
Basophils Absolute: 0.1 10*3/uL (ref 0.0–0.1)
Basophils Relative: 0 %
Eosinophils Absolute: 0 10*3/uL (ref 0.0–0.5)
Eosinophils Relative: 0 %
HCT: 36 % (ref 36.0–46.0)
Hemoglobin: 10.9 g/dL — ABNORMAL LOW (ref 12.0–15.0)
Immature Granulocytes: 1 %
Lymphocytes Relative: 16 %
Lymphs Abs: 2.2 10*3/uL (ref 0.7–4.0)
MCH: 26.3 pg (ref 26.0–34.0)
MCHC: 30.3 g/dL (ref 30.0–36.0)
MCV: 86.7 fL (ref 80.0–100.0)
Monocytes Absolute: 1.3 10*3/uL — ABNORMAL HIGH (ref 0.1–1.0)
Monocytes Relative: 9 %
Neutro Abs: 10 10*3/uL — ABNORMAL HIGH (ref 1.7–7.7)
Neutrophils Relative %: 74 %
Platelets: 111 10*3/uL — ABNORMAL LOW (ref 150–400)
RBC: 4.15 MIL/uL (ref 3.87–5.11)
RDW: 13.8 % (ref 11.5–15.5)
WBC: 13.6 10*3/uL — ABNORMAL HIGH (ref 4.0–10.5)
nRBC: 0 % (ref 0.0–0.2)

## 2021-03-24 LAB — FIBRINOGEN
Fibrinogen: 259 mg/dL (ref 210–475)
Fibrinogen: 288 mg/dL (ref 210–475)

## 2021-03-24 LAB — COMPREHENSIVE METABOLIC PANEL
ALT: 73 U/L — ABNORMAL HIGH (ref 0–44)
AST: 33 U/L (ref 15–41)
Albumin: 2.5 g/dL — ABNORMAL LOW (ref 3.5–5.0)
Alkaline Phosphatase: 237 U/L — ABNORMAL HIGH (ref 38–126)
Anion gap: 10 (ref 5–15)
BUN: <5 mg/dL — ABNORMAL LOW (ref 6–20)
CO2: 23 mmol/L (ref 22–32)
Calcium: 8.9 mg/dL (ref 8.9–10.3)
Chloride: 101 mmol/L (ref 98–111)
Creatinine, Ser: 0.59 mg/dL (ref 0.44–1.00)
GFR, Estimated: >60 mL/min (ref >60)
Glucose, Bld: 100 mg/dL — ABNORMAL HIGH (ref 70–99)
Potassium: 3.9 mmol/L (ref 3.5–5.1)
Sodium: 134 mmol/L — ABNORMAL LOW (ref 135–145)
Total Bilirubin: 0.5 mg/dL (ref 0.3–1.2)
Total Protein: 7.6 g/dL (ref 6.5–8.1)

## 2021-03-24 LAB — GLUCOSE, CAPILLARY
Glucose-Capillary: 101 mg/dL — ABNORMAL HIGH (ref 70–99)
Glucose-Capillary: 110 mg/dL — ABNORMAL HIGH (ref 70–99)
Glucose-Capillary: 68 mg/dL — ABNORMAL LOW (ref 70–99)
Glucose-Capillary: 74 mg/dL (ref 70–99)
Glucose-Capillary: 96 mg/dL (ref 70–99)

## 2021-03-24 LAB — BASIC METABOLIC PANEL
Anion gap: 10 (ref 5–15)
BUN: 5 mg/dL — ABNORMAL LOW (ref 6–20)
CO2: 23 mmol/L (ref 22–32)
Calcium: 9 mg/dL (ref 8.9–10.3)
Chloride: 103 mmol/L (ref 98–111)
Creatinine, Ser: 0.59 mg/dL (ref 0.44–1.00)
GFR, Estimated: >60 mL/min (ref >60)
Glucose, Bld: 72 mg/dL (ref 70–99)
Potassium: 4.3 mmol/L (ref 3.5–5.1)
Sodium: 136 mmol/L (ref 135–145)

## 2021-03-24 LAB — HEPARIN LEVEL (UNFRACTIONATED)
Heparin Unfractionated: <0.1 IU/mL — ABNORMAL LOW (ref 0.30–0.70)
Heparin Unfractionated: <0.1 IU/mL — ABNORMAL LOW (ref 0.30–0.70)

## 2021-03-24 SURGERY — PERIPHERAL VASCULAR THROMBECTOMY
Anesthesia: LOCAL

## 2021-03-24 MED ORDER — HEPARIN (PORCINE) IN NACL 1000-0.9 UT/500ML-% IV SOLN
INTRAVENOUS | Status: DC | PRN
  Administered 2021-03-24 (×2): 500 mL

## 2021-03-24 MED ORDER — SODIUM CHLORIDE 0.9 % IV SOLN: INTRAVENOUS | Status: DC

## 2021-03-24 MED ORDER — HEPARIN (PORCINE) 25000 UT/250ML-% IV SOLN
1400.0000 [IU]/h | INTRAVENOUS | Status: AC
  Administered 2021-03-24 (×2): 1250 [IU]/h via INTRAVENOUS

## 2021-03-24 MED ORDER — MIDAZOLAM HCL 2 MG/2ML IJ SOLN
INTRAMUSCULAR | Status: DC | PRN
  Administered 2021-03-24 (×2): 1 mg via INTRAVENOUS
  Administered 2021-03-24: 2 mg via INTRAVENOUS

## 2021-03-24 MED ORDER — HEPARIN (PORCINE) IN NACL 1000-0.9 UT/500ML-% IV SOLN
INTRAVENOUS | Status: AC
  Filled 2021-03-24: qty 500

## 2021-03-24 MED ORDER — LIDOCAINE HCL (PF) 1 % IJ SOLN
INTRAMUSCULAR | Status: DC | PRN
  Administered 2021-03-24: 20 mL via INTRADERMAL

## 2021-03-24 MED ORDER — MIDAZOLAM HCL 2 MG/2ML IJ SOLN
INTRAMUSCULAR | Status: AC
  Filled 2021-03-24: qty 2

## 2021-03-24 MED ORDER — FENTANYL CITRATE (PF) 100 MCG/2ML IJ SOLN
INTRAMUSCULAR | Status: DC | PRN
  Administered 2021-03-24 (×3): 50 ug via INTRAVENOUS

## 2021-03-24 MED ORDER — FENTANYL CITRATE (PF) 100 MCG/2ML IJ SOLN
INTRAMUSCULAR | Status: AC
  Filled 2021-03-24: qty 2

## 2021-03-24 MED ORDER — LABETALOL HCL 5 MG/ML IV SOLN
10.0000 mg | Freq: Once | INTRAVENOUS | Status: AC
  Administered 2021-03-24: 10 mg via INTRAVENOUS
  Filled 2021-03-24: qty 4

## 2021-03-24 MED ORDER — LIDOCAINE HCL (PF) 1 % IJ SOLN
INTRAMUSCULAR | Status: AC
  Filled 2021-03-24: qty 30

## 2021-03-24 MED ORDER — FE FUMARATE-B12-VIT C-FA-IFC PO CAPS
1.0000 | ORAL_CAPSULE | Freq: Two times a day (BID) | ORAL | Status: DC
  Administered 2021-03-25 – 2021-03-27 (×5): 1 via ORAL
  Filled 2021-03-24 (×5): qty 1

## 2021-03-24 MED ORDER — HEPARIN SODIUM (PORCINE) 1000 UNIT/ML IJ SOLN
INTRAMUSCULAR | Status: AC
  Filled 2021-03-24: qty 1

## 2021-03-24 MED ORDER — IODIXANOL 320 MG/ML IV SOLN
INTRAVENOUS | Status: DC | PRN
  Administered 2021-03-24: 70 mL via INTRAVENOUS

## 2021-03-24 MED ORDER — HEPARIN SODIUM (PORCINE) 1000 UNIT/ML IJ SOLN
INTRAMUSCULAR | Status: DC | PRN
  Administered 2021-03-24: 8000 [IU] via INTRAVENOUS

## 2021-03-24 SURGICAL SUPPLY — 11 items
BALLN ATLAS 16X40X75 (BALLOONS) ×3
BALLOON ATLAS 16X40X75 (BALLOONS) ×2 IMPLANT
CATH ANGIO 5F BER2 100CM (CATHETERS) ×3 IMPLANT
CATH CLOT TRIEVER BOLD (CATHETERS) ×3 IMPLANT
CATH VISIONS PV .035 IVUS (CATHETERS) ×3 IMPLANT
GLIDEWIRE ADV .035X260CM (WIRE) ×3 IMPLANT
KIT ENCORE 26 ADVANTAGE (KITS) ×6 IMPLANT
SHEATH CLOT RETRIEVER (SHEATH) ×6 IMPLANT
SHEATH PINNACLE 8F 10CM (SHEATH) ×6 IMPLANT
TRAY PV CATH (CUSTOM PROCEDURE TRAY) ×3 IMPLANT
WIRE AMPLATZ SS-J .035X260CM (WIRE) ×3 IMPLANT

## 2021-03-24 NOTE — Op Note (Signed)
DATE OF SERVICE: 03/24/2021  PATIENT:  Andrea Hayes  30 y.o. female  PRE-OPERATIVE DIAGNOSIS:  IVC and bilateral iliac thrombus  POST-OPERATIVE DIAGNOSIS:  Same  PROCEDURE:   1) bilateral vena cava, common iliac, external iliac, common femoral, femoral vein intravascular ultrasound 2) bilateral iliofemoral venogram ( 3) bilateral vena cava, iliac, femoral endovascular thrombectomy (Inari ClotTriever) 4) right common iliac vein angioplasty (14 x 76mm Atlas) 5) conscious sedation (86 minutes).  SURGEON:  Rande Brunt. Lenell Antu, MD  ASSISTANT: none  ANESTHESIA:   local and IV sedation  ESTIMATED BLOOD LOSS:  LOCAL MEDICATIONS USED:  LIDOCAINE   COUNTS: confirmed correct.  PATIENT DISPOSITION:  PACU - hemodynamically stable.   Delay start of Pharmacological VTE agent (>24hrs) due to surgical blood loss or risk of bleeding: no  INDICATION FOR PROCEDURE: Andrea Hayes is a 30 y.o. 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  Thrombolysis was initiated last night.  The patient had symptomatic improvement today.  After careful discussion of risks, benefits, and alternatives the patient was offered catheter recheck and possible endovascular thrombectomy. The patient  understood and wished to proceed.  OPERATIVE FINDINGS:  Chronic thrombus remained in the confluence of the iliac veins and bilateral common iliac veins.  This appeared nearly occlusive on venogram.  Multiple passes of the Inari ClotTriever cleared much of the thrombus out of the bilateral iliac veins and IVC.  Completion venography shows restoration of flow through the pelvis into the IVC.  DESCRIPTION OF PROCEDURE: After identification of the patient in the pre-operative holding area, the patient was transferred to the operating room. The patient was positioned supine on the operating room table.  Anesthesia was induced. The popliteal fossae were prepped and draped in standard fashion.  A surgical pause was performed confirming correct patient, procedure, and operative location.  Amplatz wires were advanced through the thrombolysis catheters, and the catheters were removed.  The bilateral 5 [place] sheaths were upsized to 8 [place].  Amplatz wires were delivered into the right internal jugular vein.  Intravascular ultrasound was performed over the wire evaluating the perihepatic IVC, perirenal IVC, infrarenal IVC, confluence of the iliac veins and bilateral common iliac veins, bilateral external iliac veins, bilateral common femoral veins, and bilateral femoral veins.  This initial ultrasound revealed residual chronic thrombus in the confluence of the iliac veins and bilateral common iliac veins.  Bilateral lower extremity venograms were performed from the sheath.  This revealed patent veins below the inguinal ligaments.  Stagnation of flow was noted in the distal external iliac vein/common femoral vein bilaterally.  Patient was systemically heparinized.  Endovascular thrombectomy was performed bilaterally.  The Inari ClotTriever was used to perform thrombectomy.  6 passes were used in the left lower extremity; 3 passes were performed in the right lower extremity.  Significant clot was returned after the first pass in each limb.  Less clot returned thereafter.  I repeated the IVUS and showed there was still some common iliac chronic thrombus bilaterally.  Venogram was performed.  This showed a near occlusive chronic thrombus in the left common iliac vein.  I performed an angioplasty of this with a 14 x 40 Atlas balloon.  Follow-up angiogram showed loose chronic thrombus.  I repeated an Inari clot Cherre Blanc pass with good return.  Follow-up venogram shows resolution of the lesion.  The right lower extremity pelvis was imaged with venography.  This showed some chronic thrombus in the common iliac vein.  This is not  flow-limiting.  I elected to end the case here.  The Inari sheaths were  removed from the popliteal veins and a 3-0 nylon stitch with bolster used to achieve hemostasis bilaterally.  Sterile bandages were applied.  Legs were wrapped with Ace wraps.  Conscious sedation was administered with the use of IV fentanyl and midazolam under continuous physician and nurse monitoring.  Heart rate, blood pressure, and oxygen saturation were continuously monitored.  Total sedation time was 86 minutes  Upon completion of the case instrument and sharps counts were confirmed correct. The patient was transferred to the PACU in good condition. I was present for all portions of the procedure.  PLAN: Resume therapeutic anticoagulation 2 hours postprocedure.  Patient needs a minimum of 6 months of anticoagulation from my standpoint.  Return to floor.  Rande Brunt. Lenell Antu, MD [facility] Phone Number: [phone] 03/24/2021 3:48 PM

## 2021-03-24 NOTE — Progress Notes (Signed)
ANTICOAGULATION CONSULT NOTE - Follow Up Consult  Pharmacy Consult for Heparin Indication: IVC thrombus + hx PE  No Known Allergies  Patient Measurements: Height: 5\' 5"  (165.1 cm) Weight: 77.1 kg (170 lb) IBW/kg (Calculated) : 57 Heparin Dosing Weight: 73 kg  Vital Signs: Temp: 98.6 F (37 C) (09/23 1126) Temp Source: Oral (09/23 1126) BP: 128/98 (09/23 1630) Pulse Rate: 129 (09/23 1630)  Labs: Recent Labs    03/22/21 0820 03/22/21 0820 03/22/21 2020 03/23/21 0530 03/23/21 1557 03/24/21 0021 03/24/21 0622  HGB 11.6*  --   --  10.9* 11.1* 12.0 11.5*  HCT 37.9  --   --  35.0* 36.4 37.5 36.9  PLT 238  --   --  217 180 168 164  LABPROT  --   --  12.8  --   --   --   --   INR  --   --  1.0  --   --   --   --   HEPARINUNFRC  --    < >  --  0.11* <0.10* <0.10* <0.10*  CREATININE 0.59  --   --  0.62  --  0.59  --    < > = values in this interval not displayed.     Estimated Creatinine Clearance: 105.5 mL/min (by C-G formula based on SCr of 0.59 mg/dL).  Medical History: Past Medical History:  Diagnosis Date   Stroke [facility])    Assessment: 46 yoF with hx PE and cerebral thrombosis admitted with IVC thrombus. Pt is noncompliant with enoxaparin/warfarin PTA.   Pt s/p PV lab s/p lysis and now s/p bilateral thrombectomy. Per OR note, heparin to start 2 hours post procedure. Heparin was increased to 1250 units/hr earlier today  Goal of Therapy:  Heparin level 0.3-0.5 units/ml Monitor platelets by anticoagulation protocol: Yes   Plan:  -Resume heparin 1250 units/hr at 6:30pm -Heparin level in 6 hours and daily wth CBC daily  26, PharmD Clinical Pharmacist **Pharmacist phone directory can now be found on [email or web address] ([email or web address]).  Listed under [facility].

## 2021-03-24 NOTE — Progress Notes (Signed)
ANTICOAGULATION CONSULT NOTE - Follow Up Consult  Pharmacy Consult for Heparin Indication: IVC thrombus + hx PE  No Known Allergies  Patient Measurements: Height: 5\' 5"  (165.1 cm) Weight: 77.1 kg (170 lb) IBW/kg (Calculated) : 57 Heparin Dosing Weight: 73 kg  Vital Signs: Temp: 99.1 F (37.3 C) (09/22 2339) Temp Source: Oral (09/22 2339) BP: 119/79 (09/23 0100) Pulse Rate: 126 (09/23 0100)  Labs: Recent Labs    03/22/21 0820 03/22/21 2020 03/23/21 0530 03/23/21 1557 03/24/21 0021  HGB 11.6*  --  10.9* 11.1* 12.0  HCT 37.9  --  35.0* 36.4 37.5  PLT 238  --  217 180 168  LABPROT  --  12.8  --   --   --   INR  --  1.0  --   --   --   HEPARINUNFRC  --   --  0.11* <0.10* <0.10*  CREATININE 0.59  --  0.62  --  0.59     Estimated Creatinine Clearance: 105.5 mL/min (by C-G formula based on SCr of 0.59 mg/dL).  Medical History: Past Medical History:  Diagnosis Date   Stroke [facility])    Assessment: 48 yoF with hx PE and cerebral thrombosis admitted with IVC thrombus. Pt is noncompliant with enoxaparin/warfarin PTA.   Pt s/p PV lab with lysis sheaths placed. Alteplase infusing at 0.5mg /h through two separate sheaths. Heparin infusing peripherally at 800 units/h, pharmacy consulted to maintain at 0.2-0.5, no bolus with lysis. -heparin level < 0.1, hg= 11.1, plt= 180, fibrinogen= 392  Heparin level came back undetectable again. We will increase rate and recheck another level in 6 hrs.   Goal of Therapy:  Heparin level 0.2-0.5 units/ml Monitor platelets by anticoagulation protocol: Yes   Plan:  -Alteplase 0.5mg /h via two separate infusion catheters in both lower extremities (1mg /h total) -Increase heparin to 1100 units/hr -Check q6h heparin level, fibinogen, CBC -Notify MD if fibrinogen <150  26, PharmD, BCIDP, AAHIVP, CPP Infectious Disease Pharmacist 03/24/2021 1:30 AM

## 2021-03-24 NOTE — Progress Notes (Signed)
    Subjective  - POD #1, s/p placement of bilateral lysis catheters from a popliteal approach  No issues overnight Back pain a little better   Physical Exam:  Bilateral edema Abd soft Non-labored breathing       Assessment/Plan:  POD #1  Plan for mechanical venous thrombectomy later today  [name] 03/24/2021 7:34 AM --  Vitals:   03/24/21 0200 03/24/21 0400  BP: 127/82 114/84  Pulse: (!) 125 (!) 124  Resp: (!) 21 (!) 22  Temp:  98.5 F (36.9 C)  SpO2: 96% 97%    Intake/Output Summary (Last 24 hours) at 03/24/2021 0734 Last data filed at 03/24/2021 0500 Gross per 24 hour  Intake 1052.44 ml  Output 900 ml  Net 152.44 ml     Laboratory CBC    Component Value Date/Time   WBC 12.5 (H) 03/24/2021 0622   HGB 11.5 (L) 03/24/2021 0622   HGB 13.6 10/31/2020 1343   HCT 36.9 03/24/2021 0622   HCT 40.7 10/31/2020 1343   PLT 164 03/24/2021 0622   PLT 148 (L) 10/31/2020 1343    BMET    Component Value Date/Time   NA 134 (L) 03/24/2021 0021   K 3.9 03/24/2021 0021   CL 101 03/24/2021 0021   CO2 23 03/24/2021 0021   GLUCOSE 100 (H) 03/24/2021 0021   BUN <5 (L) 03/24/2021 0021   CREATININE 0.59 03/24/2021 0021   CALCIUM 8.9 03/24/2021 0021   GFRNONAA >60 03/24/2021 0021   GFRAA >60 01/27/2020 0737    COAG Lab Results  Component Value Date   INR 1.0 03/22/2021   INR 1.7 (H) 01/26/2021   INR 1.2 01/25/2021   No results found for: PTT  Antibiotics Anti-infectives (From admission, onward)    None        Andrea Hayes, M.D., [facility] [place] Office: [phone] Pager:  [phone]

## 2021-03-24 NOTE — Progress Notes (Signed)
PROGRESS NOTE    Andrea Hayes  FBP:[id number] DOB: 07/21/1990 DOA: 03/22/2021 PCP: Fleet Contras, MD   Brief Narrative: Taken from H&P. Andrea Hayes is a 30 y.o. female with history of cerebral thrombosis, idiopathic intracranial hypertension, history of CVA and recently admitted in July 2022 for pulmonary embolism on Lovenox which patient has not been very compliant has missed multiple doses presents to the ER with complaints of flank and back pain and epigastric pain for the last 2 weeks.   On arrival she was found to have mild leukocytosis, abnormal liver enzymes, CT abdomen and pelvis with occlusive thrombosis involving infrarenal IVC, bilateral common iliac, right external iliac. Vascular surgery was consulted and she was started on heparin infusion, she was taken to the OR and bilateral lower extremity catheter was placed for thrombolysis with tPA. Going to OR again today for thrombectomy with vascular surgery.  Patient had 2 admissions earlier this year 1 in July 2022 with PE and 1 and May 2022 for intracerebral thrombosis.Hypercoagulable work-up was unremarkable including  homocystein 4.8, antithrombin III mildly elevated at 128, low protein S activity of 22, total protein S 109, protein C activity 180, total protein C 127, lupus anticoagulant 25.3, DRVVT 44.7, beta-2-glycoprotein antibodies <9, factor V leiden mutation negative, prothrombin gene mutation negative, cardiolipin antibodies negative, sickle cell screen negative.She was discharged on Coumadin with Lovenox bridge, doubt she was taking it as INR was 1 on admission.  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  Subjective: Patient was seen and examined today.  No new complaints.  No abnormal bleeding.  tPA is still going on, getting ready to go to the OR for mechanical thrombectomy. We discussed about staying on anticoagulation for lifelong and go to a tertiary  care center for further work-up as initial work-up was negative for common hypercoagulable disorders. Patient is also concerned about lactation with anticoagulation-discussed that lactation can be suspended if needed as stopping anticoagulation can have more devastating effects.  Assessment & Plan:   Principal Problem:   IVC thrombosis (HCC) Active Problems:   Idiopathic intracranial hypertension   Elevated liver enzymes   DVT (deep venous thrombosis) (HCC)  IVC thrombosis.  Patient had extensive intravenous thrombosis, involving infrarenal IVC, bilateral common iliac, right external iliac, extending up to the level of hepatic vein, s/p thrombectomy and thrombolysis.  History of idiopathic thrombosis, negative hypercoagulable work-up done in May 2022. Currently on tPA-being managed by vascular surgery in ICU. -Continue to monitor -We will discuss with vascular if she can be discharged on NOAC for monitoring and compliance issues. -She will need extensive counseling and education in. -Should see an hematologist as an outpatient for further work-up.  Elevated liver enzymes.  Seems to have chronically elevated liver enzymes with some improvement as compared to prior during recent admission in July 2022.  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  Acute hepatitis panel was negative at that time. -Monitor renal function -Avoid hepatotoxins.  History of idiopathic intracranial hypertension.  No acute concern or headaches.  Takes acetazolamide as needed. -Can continue acetazolamide as needed. -Need to have a close follow-up with neurology as an outpatient  Normocytic anemia. -Anemia panel with iron deficiency and borderline B12 levels. -Start her on iron and B12 supplement. -Continue to monitor   Objective: Vitals:   03/24/21 1230 03/24/21 1300 03/24/21 1330 03/24/21 1415  BP:  118/87 125/90 (!) 130/94   Pulse:  (!) 114  (!) 118   Resp: (!) 24 (!) 24 (!) 23   Temp:      TempSrc:      SpO2: 90%  (!) 56% 100%  Weight:      Height:        Intake/Output Summary (Last 24 hours) at 03/24/2021 1533 Last data filed at 03/24/2021 0500 Gross per 24 hour  Intake 1008.93 ml  Output 900 ml  Net 108.93 ml    Filed Weights   03/22/21 2342  Weight: 77.1 kg    Examination:  General.  Well-developed lady, in no acute distress. Pulmonary.  Lungs clear bilaterally, normal respiratory effort. CV.  Regular rate and rhythm, no JVD, rub or murmur. Abdomen.  Soft, nontender, nondistended, BS positive. CNS.  Alert and oriented x3.  No focal neurologic deficit. Extremities.  No edema, no cyanosis, pulses intact and symmetrical. Psychiatry.  Judgment and insight appears normal.   DVT prophylaxis:  Code Status: Full Family Communication: Discussed with patient  Disposition Plan:  Status is: Inpatient  Remains inpatient appropriate because:Inpatient level of care appropriate due to severity of illness  Dispo: The patient is from: Home              Anticipated d/c is to: Home              Patient currently is not medically stable to d/c.   Difficult to place patient No              Level of care: ICU  All the records are reviewed and case discussed with Care Management/Social Worker. Management plans discussed with the patient, nursing and they are in agreement.  Consultants:  Vascular surgery  Procedures:  Antimicrobials:   Data Reviewed: I have personally reviewed following labs and imaging studies  CBC: Recent Labs  Lab 03/22/21 0820 03/23/21 0530 03/23/21 1557 03/24/21 0021 03/24/21 0622  WBC 12.5* 12.6* 14.6* 14.0* 12.5*  NEUTROABS  --  9.9*  --   --   --   HGB 11.6* 10.9* 11.1* 12.0 11.5*  HCT 37.9 35.0* 36.4 37.5 36.9  MCV 87.7 87.1 87.3 86.0 86.6  PLT 238 217 180 168 164    Basic Metabolic Panel: Recent Labs  Lab 03/22/21 0820 03/23/21 0530 03/24/21 0021  NA 134* 135  134*  K 4.5 3.9 3.9  CL 99 101 101  CO2 25 24 23   GLUCOSE 118* 100* 100*  BUN 7 5* <5*  CREATININE 0.59 0.62 0.59  CALCIUM 9.6 8.9 8.9    GFR: Estimated Creatinine Clearance: 105.5 mL/min (by C-G formula based on SCr of 0.59 mg/dL). Liver Function Tests: Recent Labs  Lab 03/22/21 0820 03/23/21 0530 03/24/21 0021  AST 168* 63* 33  ALT 169* 105* 73*  ALKPHOS 282* 248* 237*  BILITOT 0.6 0.6 0.5  PROT 8.2* 7.5 7.6  ALBUMIN 3.1* 2.5* 2.5*    Recent Labs  Lab 03/22/21 0820  LIPASE 29    No results for input(s): AMMONIA in the last 168 hours. Coagulation Profile: Recent Labs  Lab 03/22/21 2020  INR 1.0    Cardiac Enzymes: No results for input(s): CKTOTAL, CKMB, CKMBINDEX, TROPONINI in the last 168 hours. BNP (last 3 results) No results for input(s): PROBNP in the last 8760 hours. HbA1C: No results for input(s): HGBA1C in the last 72 hours. CBG: Recent Labs  Lab 03/23/21 1155 03/23/21 1753 03/23/21 2327 03/24/21 0619 03/24/21 1125  GLUCAP 98 91  84 101* 96    Lipid Profile: No results for input(s): CHOL, HDL, LDLCALC, TRIG, CHOLHDL, LDLDIRECT in the last 72 hours. Thyroid Function Tests: No results for input(s): TSH, T4TOTAL, FREET4, T3FREE, THYROIDAB in the last 72 hours. Anemia Panel: Recent Labs    03/23/21 1202  VITAMINB12 314  FOLATE 30.2  FERRITIN 88  TIBC 364  IRON 15*  RETICCTPCT 1.6    Sepsis Labs: No results for input(s): PROCALCITON, LATICACIDVEN in the last 168 hours.  Recent Results (from the past 240 hour(s))  Resp Panel by RT-PCR (Flu A&B, Covid) Nasopharyngeal Swab     Status: None   Collection Time: 03/22/21 10:08 PM   Specimen: Nasopharyngeal Swab; Nasopharyngeal(NP) swabs in vial transport medium  Result Value Ref Range Status   SARS Coronavirus 2 by RT PCR NEGATIVE NEGATIVE Final    Comment: (NOTE) SARS-CoV-2 target nucleic acids are NOT DETECTED.  The SARS-CoV-2 RNA is generally detectable in upper respiratory specimens  during the acute phase of infection. The lowest concentration of SARS-CoV-2 viral copies this assay can detect is 138 copies/mL. A negative result does not preclude SARS-Cov-2 infection and should not be used as the sole basis for treatment or other patient management decisions. A negative result may occur with  improper specimen collection/handling, submission of specimen other than nasopharyngeal swab, presence of viral mutation(s) within the areas targeted by this assay, and inadequate number of viral copies(<138 copies/mL). A negative result must be combined with clinical observations, patient history, and epidemiological information. The expected result is Negative.  Fact Sheet for Patients:  BloggerCourse.com  Fact Sheet for Healthcare Providers:  SeriousBroker.it  This test is no t yet approved or cleared by the [place] FDA and  has been authorized for detection and/or diagnosis of SARS-CoV-2 by FDA under an Emergency Use Authorization (EUA). This EUA will remain  in effect (meaning this test can be used) for the duration of the COVID-19 declaration under Section 564(b)(1) of the Act, 21 U.S.C.section 360bbb-3(b)(1), unless the authorization is terminated  or revoked sooner.       Influenza A by PCR NEGATIVE NEGATIVE Final   Influenza B by PCR NEGATIVE NEGATIVE Final    Comment: (NOTE) The Xpert Xpress SARS-CoV-2/FLU/RSV plus assay is intended as an aid in the diagnosis of influenza from Nasopharyngeal swab specimens and should not be used as a sole basis for treatment. Nasal washings and aspirates are unacceptable for Xpert Xpress SARS-CoV-2/FLU/RSV testing.  Fact Sheet for Patients: BloggerCourse.com  Fact Sheet for Healthcare Providers: SeriousBroker.it  This test is not yet approved or cleared by the [place] FDA and has been authorized for detection  and/or diagnosis of SARS-CoV-2 by FDA under an Emergency Use Authorization (EUA). This EUA will remain in effect (meaning this test can be used) for the duration of the COVID-19 declaration under Section 564(b)(1) of the Act, 21 U.S.C. section 360bbb-3(b)(1), unless the authorization is terminated or revoked.  Performed at [facility], [address], [place], [place] [address]       Radiology Studies: CT Abdomen Pelvis W Contrast  Result Date: 03/22/2021 CLINICAL DATA:  Abdominal infection suspected. EXAM: CT ABDOMEN AND PELVIS WITH CONTRAST TECHNIQUE: Multidetector CT imaging of the abdomen and pelvis was performed using the standard protocol following bolus administration of intravenous contrast. CONTRAST:  81mL OMNIPAQUE IOHEXOL 350 MG/ML SOLN COMPARISON:  CT abdomen and pelvis 07/13/2017. FINDINGS: Lower chest: There is a trace right pleural effusion. Hepatobiliary: No focal liver abnormality is seen. No  gallstones, gallbladder wall thickening, or biliary dilatation. Pancreas: Unremarkable. No pancreatic ductal dilatation or surrounding inflammatory changes. Spleen: Normal in size without focal abnormality. Adrenals/Urinary Tract: Adrenal glands are unremarkable. Kidneys are normal, without renal calculi, focal lesion, or hydronephrosis. Bladder is unremarkable. Stomach/Bowel: Stomach is within normal limits. Appendix appears normal. No evidence of bowel wall thickening, distention, or inflammatory changes. Vascular/Lymphatic: The aorta appears within normal limits. There is occlusive thrombus throughout the entire infrarenal IVC. There is nonocclusive thrombus in the IVC at the level of the renal arteries and within the intrahepatic IVC. Opacification of the iliac veins is poor, but there are findings suspicious for occlusive thrombus within the right common and external iliac vein and possibly the left common iliac vein. There are prominent bilateral inguinal lymph nodes. Reproductive:  Uterus is slightly prominent in size. There is a 2.8 cm cyst in the left ovary. Right adnexa is unremarkable. Other: There is presacral edema as well as diffuse retroperitoneal edema surrounding the IVC. There is no ascites or free intraperitoneal air. Subcutaneous densities in the anterior abdominal wall are likely related to medication injection sites. Musculoskeletal: No acute or significant osseous findings. IMPRESSION: 1. Occlusive thrombus throughout the infrarenal IVC. Nonocclusive thrombus in the upper abdominal IVC. Findings highly suspicious for bilateral common iliac vein occlusive thrombus and right external iliac vein thrombus. 2. Presacral and retroperitoneal edema likely secondary to venous congestion. Infectious or inflammatory process cannot be excluded. 3. 2.8 cm left ovarian simple-appearing cyst. No follow-up imaging is recommended. Reference: JACR 2020 Feb;17(2):248-254 4. Trace right pleural effusion. 5. These results were called by telephone at the time of interpretation on 03/22/2021 at 10:01 pm to provider Dr. Hyacinth Meeker, who verbally acknowledged these results. Electronically Signed   By: Darliss Cheney M.D.   On: 03/22/2021 22:01   PERIPHERAL VASCULAR CATHETERIZATION  Result Date: 03/23/2021 Images from the original result were not included. Patient name: Andrea Hayes MRN: [id number] DOB: [birth year] Sex: female 03/23/2021 Pre-operative Diagnosis: IVC, bilateral common iliac, bilateral external iliac and bilateral common femoral vein DVT Post-operative diagnosis:  Same Surgeon:  Luanna Salk. Randie Heinz, MD Procedure Performed: 1.  Ultrasound-guided cannulation bilateral popliteal veins 2.  IVC venogram 3.  Placement of IVC bilateral common iliac vein, bilateral external iliac vein bilateral common femoral vein and bilateral femoral vein 50 cm treatment length UniFuse catheters 4.  Moderate sedation with fentanyl and Versed for 35 minutes Indications: 30 year old female presented with abdominal pain  was found to have extensive DVT from her IVC extending down to her bilateral common femoral veins.  Also has bilateral lower extremity swelling.  She is indicated for lytic catheter placement. Findings: Bilateral popliteal veins were distended but they were compressible.  The did have bleeding when the micropuncture sheath was placed.  By central venogram there did appear to be thrombus extending up to the level of the hepatic veins.  There was difficulty placing a wire past the bilateral common iliac veins into the IVC confluence this required catheter direction.  Bilateral UniFuse catheters were placed that were 50 cm treatment length from the distal IVC down to the mid femoral veins.  Procedure:  The patient was identified in the holding area and taken to room 8.  The patient was then placed prone on the table and prepped and draped in the usual sterile fashion.  A time out was called.  Ultrasound was used to evaluate first the left popliteal vein.  This was large but was compressible.  There is no spasm facility  again cannulated with micropuncture needle followed by wire and sheath.  We then placed a Glidewire.  A 5 French sheath was placed.  Fluoroscopically we advanced the Glidewire to the level of the left common iliac vein.  A Berenstein catheter was placed.  Was directed to the IVC.  At the terminal IVC we performed bleeding venogram which demonstrated thrombus to the level of the hepatic veins.  We placed UniFuse catheter as well as the occluder wire.  We turned our attention to the right leg.  Ultrasound used to identify the popliteal vein on that side.  This also was enlarged although somewhat compressible.  The area again was anesthetized 1% lidocaine cannulated micropuncture needle followed by wire and sheath.  Glidewire was placed followed by 5 [place] sheath.  Again the Glidewire did hang up at the common iliac vein on the right.  We used Berenstein catheter directed the IVC and again perform IVC  venogram at the terminal IVC to confirm intraluminal access.  The UniFuse catheter was placed with the occluded wire.  Single shot images were taken centrally and peripherally to demonstrate the placement of the catheter.  Both catheters were flushed.  They will be helped tPA.  She tolerated procedure without any complication. Contrast: 20 cc Brandon C. Randie Heinz, MD [facility] Office: [phone] Pager: [phone]    Scheduled Meds:  [MAR Hold] Chlorhexidine Gluconate Cloth  6 each Topical Daily   [MAR Hold] sodium chloride flush  3 mL Intravenous Q12H   Continuous Infusions:  [MAR Hold] sodium chloride     sodium chloride 100 mL/hr at 03/24/21 0720   alteplase (LIMB ISCHEMIA) 10 mg in normal saline (0.02 mg/mL) infusion Stopped (03/24/21 1405)   alteplase (LIMB ISCHEMIA) 10 mg in normal saline (0.02 mg/mL) infusion Stopped (03/24/21 1405)   heparin Stopped (03/24/21 1405)     LOS: 2 days   Time spent: 40 minutes More than 50% of the time was spent in counseling/coordination of care  Arnetha Courser, MD [facility]  If 7PM-7AM, please contact night-coverage [email or web address]  03/24/2021, 3:33 PM   This record has been created using Conservation officer, historic buildings. Errors have been sought and corrected,but may not always be located. Such creation errors do not reflect on the standard of care.

## 2021-03-24 NOTE — Progress Notes (Signed)
ANTICOAGULATION CONSULT NOTE - Follow Up Consult  Pharmacy Consult for Heparin Indication: IVC thrombus + hx PE  No Known Allergies  Patient Measurements: Height: 5\' 5"  (165.1 cm) Weight: 77.1 kg (170 lb) IBW/kg (Calculated) : 57 Heparin Dosing Weight: 73 kg  Vital Signs: Temp: 98.9 F (37.2 C) (09/23 0745) Temp Source: Oral (09/23 0745) BP: 114/84 (09/23 0400) Pulse Rate: 124 (09/23 0400)  Labs: Recent Labs    03/22/21 0820 03/22/21 0820 03/22/21 2020 03/23/21 0530 03/23/21 1557 03/24/21 0021 03/24/21 0622  HGB 11.6*  --   --  10.9* 11.1* 12.0 11.5*  HCT 37.9  --   --  35.0* 36.4 37.5 36.9  PLT 238  --   --  217 180 168 164  LABPROT  --   --  12.8  --   --   --   --   INR  --   --  1.0  --   --   --   --   HEPARINUNFRC  --    < >  --  0.11* <0.10* <0.10* <0.10*  CREATININE 0.59  --   --  0.62  --  0.59  --    < > = values in this interval not displayed.     Estimated Creatinine Clearance: 105.5 mL/min (by C-G formula based on SCr of 0.59 mg/dL).  Medical History: Past Medical History:  Diagnosis Date   Stroke [facility])    Assessment: 65 yoF with hx PE and cerebral thrombosis admitted with IVC thrombus. Pt is noncompliant with enoxaparin/warfarin PTA.   Pt s/p PV lab with lysis sheaths placed. Alteplase infusing at 0.5mg /h through two separate sheaths. Heparin infusing peripherally at 800 units/h, pharmacy consulted to maintain at 0.2-0.5, no bolus with lysis.  Initial heparin level remains undetectable, CBC and fibrinogen stable.  Goal of Therapy:  Heparin level 0.2-0.5 units/ml Monitor platelets by anticoagulation protocol: Yes   Plan:  -Alteplase 0.5mg /h via two separate infusion catheters in both lower extremities (1mg /h total) -Increase heparin to 1250 units/h -Check q6h heparin level, fibinogen, CBC -Notify MD if fibrinogen <150 -Repeat thrombectomy later today   26, PharmD, BCPS, [facility] Clinical Pharmacist [phone] Please check AMION  for all [facility] Pharmacy numbers 03/24/2021

## 2021-03-25 LAB — CBC
HCT: 31.9 % — ABNORMAL LOW (ref 36.0–46.0)
Hemoglobin: 10.2 g/dL — ABNORMAL LOW (ref 12.0–15.0)
MCH: 27.5 pg (ref 26.0–34.0)
MCHC: 32 g/dL (ref 30.0–36.0)
MCV: 86 fL (ref 80.0–100.0)
Platelets: 108 10*3/uL — ABNORMAL LOW (ref 150–400)
RBC: 3.71 MIL/uL — ABNORMAL LOW (ref 3.87–5.11)
RDW: 13.8 % (ref 11.5–15.5)
WBC: 11.9 10*3/uL — ABNORMAL HIGH (ref 4.0–10.5)
nRBC: 0 % (ref 0.0–0.2)

## 2021-03-25 LAB — COMPREHENSIVE METABOLIC PANEL
ALT: 37 U/L (ref 0–44)
AST: 19 U/L (ref 15–41)
Albumin: 1.9 g/dL — ABNORMAL LOW (ref 3.5–5.0)
Alkaline Phosphatase: 167 U/L — ABNORMAL HIGH (ref 38–126)
Anion gap: 7 (ref 5–15)
BUN: <5 mg/dL — ABNORMAL LOW (ref 6–20)
CO2: 26 mmol/L (ref 22–32)
Calcium: 8.4 mg/dL — ABNORMAL LOW (ref 8.9–10.3)
Chloride: 103 mmol/L (ref 98–111)
Creatinine, Ser: 0.51 mg/dL (ref 0.44–1.00)
GFR, Estimated: >60 mL/min (ref >60)
Glucose, Bld: 101 mg/dL — ABNORMAL HIGH (ref 70–99)
Potassium: 3.9 mmol/L (ref 3.5–5.1)
Sodium: 136 mmol/L (ref 135–145)
Total Bilirubin: 0.7 mg/dL (ref 0.3–1.2)
Total Protein: 6.1 g/dL — ABNORMAL LOW (ref 6.5–8.1)

## 2021-03-25 LAB — GLUCOSE, CAPILLARY
Glucose-Capillary: 100 mg/dL — ABNORMAL HIGH (ref 70–99)
Glucose-Capillary: 115 mg/dL — ABNORMAL HIGH (ref 70–99)
Glucose-Capillary: 100 mg/dL — ABNORMAL HIGH (ref 70–99)

## 2021-03-25 LAB — TSH: TSH: 4.265 u[IU]/mL (ref 0.350–4.500)

## 2021-03-25 LAB — HEPARIN LEVEL (UNFRACTIONATED)
Heparin Unfractionated: 0.11 IU/mL — ABNORMAL LOW (ref 0.30–0.70)
Heparin Unfractionated: 0.15 IU/mL — ABNORMAL LOW (ref 0.30–0.70)

## 2021-03-25 MED ORDER — METOPROLOL TARTRATE 12.5 MG HALF TABLET
12.5000 mg | ORAL_TABLET | Freq: Two times a day (BID) | ORAL | Status: DC
  Administered 2021-03-25 – 2021-03-26 (×3): 12.5 mg via ORAL
  Filled 2021-03-25 (×3): qty 1

## 2021-03-25 MED ORDER — RIVAROXABAN 20 MG PO TABS: 20.0000 mg | ORAL_TABLET | Freq: Every day | ORAL | Status: DC

## 2021-03-25 MED ORDER — RIVAROXABAN 15 MG PO TABS
15.0000 mg | ORAL_TABLET | Freq: Two times a day (BID) | ORAL | Status: DC
Start: 2021-03-25 — End: 2021-03-27
  Administered 2021-03-25 – 2021-03-27 (×4): 15 mg via ORAL
  Filled 2021-03-25 (×6): qty 1

## 2021-03-25 MED ORDER — HYDROCODONE-ACETAMINOPHEN 5-325 MG PO TABS
1.0000 | ORAL_TABLET | ORAL | Status: DC | PRN
  Administered 2021-03-25 – 2021-03-26 (×5): 2 via ORAL
  Filled 2021-03-25 (×5): qty 2

## 2021-03-25 NOTE — Progress Notes (Addendum)
PROGRESS NOTE    Andrea Hayes  ZOX:[id number] DOB: [birth year] DOA: 03/22/2021 PCP: Fleet Contras, MD   Brief Narrative: Taken from H&P. Andrea Hayes is a 30 y.o. female with history of cerebral thrombosis, idiopathic intracranial hypertension, history of CVA and recently admitted in July 2022 for pulmonary embolism on Lovenox which patient has not been very compliant has missed multiple doses presents to the ER with complaints of flank and back pain and epigastric pain for the last 2 weeks.   On arrival she was found to have mild leukocytosis, abnormal liver enzymes, CT abdomen and pelvis with occlusive thrombosis involving infrarenal IVC, bilateral common iliac, right external iliac. Vascular surgery was consulted and she was started on heparin infusion, she was taken to the OR and bilateral lower extremity catheter was placed for thrombolysis with tPA.  Patient had 2 admissions earlier this year 1 in July 2022 with PE and 1 and May 2022 for intracerebral thrombosis.Hypercoagulable work-up was unremarkable including  homocystein 4.8, antithrombin III mildly elevated at 128, low protein S activity of 22, total protein S 109, protein C activity 180, total protein C 127, lupus anticoagulant 25.3, DRVVT 44.7, beta-2-glycoprotein antibodies <9, factor V leiden mutation negative, prothrombin gene mutation negative, cardiolipin antibodies negative, sickle cell screen negative.She was discharged on Coumadin with Lovenox bridge, doubt she was taking it as INR was 1 on admission.  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  9/24: Patient underwent mechanical thrombectomy with vascular surgery on 03/24/2021, extensive thrombosis involving the IVC and bilateral iliac arteries were done.  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   Subjective: Patient continued to have abdominal pain and  requiring as needed morphine quite frequently.  No nausea or vomiting.  No chest pain or shortness of breath.  Assessment & Plan:   Principal Problem:   IVC thrombosis (HCC) Active Problems:   Idiopathic intracranial hypertension   Elevated liver enzymes   DVT (deep venous thrombosis) (HCC)  IVC thrombosis.  Patient had extensive intravenous thrombosis, involving infrarenal IVC, bilateral common iliac, right external iliac, extending up to the level of hepatic vein, s/p thrombectomy and thrombolysis.  History of idiopathic thrombosis, negative hypercoagulable work-up done in May 2022. 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 -Per vascular surgery will need at least 6 months of anticoagulation. -Continue to monitor -We will discuss with vascular if she can be discharged on NOAC for monitoring and compliance issues-talked with Dr. Randie Heinz and we can start her on NOAC today. -Pharmacy concern for Xarelto. -She will need extensive counseling and education in. -Should see an hematologist as an outpatient for further work-up, preferably at a specialized center.  Sinus tachycardia.  Can be due to pain.  TSH within normal limit. -Pain management -We will add low-dose metoprolol -Monitor  Elevated liver enzymes.  Improving. Seems to have chronically elevated liver enzymes with some improvement as compared to prior during recent admission in July 2022.  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  Acute hepatitis panel was negative at that time. -Monitor liver function -Avoid hepatotoxins.  Abdominal pain.  Requiring a lot of as needed morphine. -Will add  Norco and try to wean off from morphine.  History of idiopathic  intracranial hypertension.  No acute concern or headaches.  Takes acetazolamide as needed. -Can continue acetazolamide as needed. -Need to have a close follow-up with neurology as an outpatient  Normocytic anemia.  Seems stable, mild decreased after the procedure yesterday.  Hemoglobin at 10.2. Anemia panel with iron deficiency and borderline B12 levels.  She was started on supplement. -Continue iron and B12 supplement- -Continue to monitor   Objective: Vitals:   03/25/21 0100 03/25/21 0200 03/25/21 0300 03/25/21 0400  BP: 117/79  115/83   Pulse: (!) 123 (!) 127 (!) 125 (!) 124  Resp: (!) 24 (!) 24 (!) 22 (!) 23  Temp:      TempSrc:      SpO2: 100% 99% 97% 96%  Weight:      Height:        Intake/Output Summary (Last 24 hours) at 03/25/2021 0751 Last data filed at 03/25/2021 0400 Gross per 24 hour  Intake 2430.99 ml  Output 1500 ml  Net 930.99 ml    Filed Weights   03/22/21 2342  Weight: 77.1 kg    Examination:  General.  Well-developed lady, in no acute distress. Pulmonary.  Lungs clear bilaterally, normal respiratory effort. CV.  Sinus tachycardia Abdomen.  Soft, nontender, nondistended, BS positive. CNS.  Alert and oriented x3.  No focal neurologic deficit. Extremities.  No edema, no cyanosis, pulses intact and symmetrical.  Bilateral Ace wrap on lower extremities. Psychiatry.  Judgment and insight appears normal.   DVT prophylaxis: Heparin which will be transitioned to Xarelto Code Status: Full Family Communication: Discussed with patient  Disposition Plan:  Status is: Inpatient  Remains inpatient appropriate because:Inpatient level of care appropriate due to severity of illness  Dispo: The patient is from: Home              Anticipated d/c is to: Home              Patient currently is not medically stable to d/c.   Difficult to place patient No              Level of care: ICU  All the records are reviewed and case discussed with Care Management/Social  Worker. Management plans discussed with the patient, nursing and they are in agreement.  Consultants:  Vascular surgery  Procedures:  Antimicrobials:   Data Reviewed: I have personally reviewed following labs and imaging studies  CBC: Recent Labs  Lab 03/23/21 0530 03/23/21 1557 03/24/21 0021 03/24/21 0622 03/24/21 2245 03/25/21 0034  WBC 12.6* 14.6* 14.0* 12.5* 13.6* 11.9*  NEUTROABS 9.9*  --   --   --  10.0*  --   HGB 10.9* 11.1* 12.0 11.5* 10.9* 10.2*  HCT 35.0* 36.4 37.5 36.9 36.0 31.9*  MCV 87.1 87.3 86.0 86.6 86.7 86.0  PLT 217 180 168 164 111* 108*    Basic Metabolic Panel: Recent Labs  Lab 03/22/21 0820 03/23/21 0530 03/24/21 0021 03/24/21 1933 03/25/21 0034  NA 134* 135 134* 136 136  K 4.5 3.9 3.9 4.3 3.9  CL 99 101 101 103 103  CO2 25 24 23 23 26   GLUCOSE 118* 100* 100* 72 101*  BUN 7 5* <5* 5* <5*  CREATININE 0.59 0.62 0.59 0.59 0.51  CALCIUM 9.6 8.9 8.9 9.0 8.4*    GFR: Estimated Creatinine Clearance: 105.5 mL/min (by C-G formula based on SCr of 0.51 mg/dL). Liver Function Tests: Recent Labs  Lab 03/22/21 0820 03/23/21  0530 03/24/21 0021 03/25/21 0034  AST 168* 63* 33 19  ALT 169* 105* 73* 37  ALKPHOS 282* 248* 237* 167*  BILITOT 0.6 0.6 0.5 0.7  PROT 8.2* 7.5 7.6 6.1*  ALBUMIN 3.1* 2.5* 2.5* 1.9*    Recent Labs  Lab 03/22/21 0820  LIPASE 29    No results for input(s): AMMONIA in the last 168 hours. Coagulation Profile: Recent Labs  Lab 03/22/21 2020  INR 1.0    Cardiac Enzymes: No results for input(s): CKTOTAL, CKMB, CKMBINDEX, TROPONINI in the last 168 hours. BNP (last 3 results) No results for input(s): PROBNP in the last 8760 hours. HbA1C: No results for input(s): HGBA1C in the last 72 hours. CBG: Recent Labs  Lab 03/24/21 1125 03/24/21 1752 03/24/21 1956 03/24/21 2345 03/25/21 0614  GLUCAP 96 74 68* 110* 100*    Lipid Profile: No results for input(s): CHOL, HDL, LDLCALC, TRIG, CHOLHDL, LDLDIRECT in the  last 72 hours. Thyroid Function Tests: No results for input(s): TSH, T4TOTAL, FREET4, T3FREE, THYROIDAB in the last 72 hours. Anemia Panel: Recent Labs    03/23/21 1202  VITAMINB12 314  FOLATE 30.2  FERRITIN 88  TIBC 364  IRON 15*  RETICCTPCT 1.6    Sepsis Labs: No results for input(s): PROCALCITON, LATICACIDVEN in the last 168 hours.  Recent Results (from the past 240 hour(s))  Resp Panel by RT-PCR (Flu A&B, Covid) Nasopharyngeal Swab     Status: None   Collection Time: 03/22/21 10:08 PM   Specimen: Nasopharyngeal Swab; Nasopharyngeal(NP) swabs in vial transport medium  Result Value Ref Range Status   SARS Coronavirus 2 by RT PCR NEGATIVE NEGATIVE Final    Comment: (NOTE) SARS-CoV-2 target nucleic acids are NOT DETECTED.  The SARS-CoV-2 RNA is generally detectable in upper respiratory specimens during the acute phase of infection. The lowest concentration of SARS-CoV-2 viral copies this assay can detect is 138 copies/mL. A negative result does not preclude SARS-Cov-2 infection and should not be used as the sole basis for treatment or other patient management decisions. A negative result may occur with  improper specimen collection/handling, submission of specimen other than nasopharyngeal swab, presence of viral mutation(s) within the areas targeted by this assay, and inadequate number of viral copies(<138 copies/mL). A negative result must be combined with clinical observations, patient history, and epidemiological information. The expected result is Negative.  Fact Sheet for Patients:  BloggerCourse.com  Fact Sheet for Healthcare Providers:  SeriousBroker.it  This test is no t yet approved or cleared by the [place] FDA and  has been authorized for detection and/or diagnosis of SARS-CoV-2 by FDA under an Emergency Use Authorization (EUA). This EUA will remain  in effect (meaning this test can be used) for the  duration of the COVID-19 declaration under Section 564(b)(1) of the Act, 21 U.S.C.section 360bbb-3(b)(1), unless the authorization is terminated  or revoked sooner.       Influenza A by PCR NEGATIVE NEGATIVE Final   Influenza B by PCR NEGATIVE NEGATIVE Final    Comment: (NOTE) The Xpert Xpress SARS-CoV-2/FLU/RSV plus assay is intended as an aid in the diagnosis of influenza from Nasopharyngeal swab specimens and should not be used as a sole basis for treatment. Nasal washings and aspirates are unacceptable for Xpert Xpress SARS-CoV-2/FLU/RSV testing.  Fact Sheet for Patients: BloggerCourse.com  Fact Sheet for Healthcare Providers: SeriousBroker.it  This test is not yet approved or cleared by the [place] FDA and has been authorized for detection and/or diagnosis of SARS-CoV-2 by FDA under  an Emergency Use Authorization (EUA). This EUA will remain in effect (meaning this test can be used) for the duration of the COVID-19 declaration under Section 564(b)(1) of the Act, 21 U.S.C. section 360bbb-3(b)(1), unless the authorization is terminated or revoked.  Performed at [facility], [address] [place], [place] [address]       Radiology Studies: PERIPHERAL VASCULAR CATHETERIZATION  Result Date: 03/24/2021 Table formatting from the original result was not included. Images from the original result were not included. Signed                                                                          DATE OF SERVICE: 03/24/2021  PATIENT:  Andrea Hayes  30 y.o. female  PRE-OPERATIVE DIAGNOSIS:  IVC and bilateral iliac thrombus  POST-OPERATIVE DIAGNOSIS:  Same  PROCEDURE:  1) bilateral vena cava, common iliac, external iliac, common femoral, femoral vein intravascular ultrasound 2) bilateral iliofemoral venogram ( 3) bilateral vena cava, iliac, femoral endovascular thrombectomy (Inari ClotTriever) 4) right common iliac vein  angioplasty (14 x 79mm Atlas) 5) conscious sedation (86 minutes).  SURGEON:  Rande Brunt. Lenell Antu, MD  ASSISTANT: none  ANESTHESIA:   local and IV sedation  ESTIMATED BLOOD LOSS:  LOCAL MEDICATIONS USED:  LIDOCAINE  COUNTS: confirmed correct.  PATIENT DISPOSITION:  PACU - hemodynamically stable.  Delay start of Pharmacological VTE agent (>24hrs) due to surgical blood loss or risk of bleeding: no  INDICATION FOR PROCEDURE: Andrea Hayes is a 30 y.o. 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  Thrombolysis was initiated last night.  The patient had symptomatic improvement today.  After careful discussion of risks, benefits, and alternatives the patient was offered catheter recheck and possible endovascular thrombectomy. The patient  understood and wished to proceed.  OPERATIVE FINDINGS: Chronic thrombus remained in the confluence of the iliac veins and bilateral common iliac veins.  This appeared nearly occlusive on venogram.  Multiple passes of the Inari ClotTriever cleared much of the thrombus out of the bilateral iliac veins and IVC.  Completion venography shows restoration of flow through the pelvis into the IVC.  DESCRIPTION OF PROCEDURE: After identification of the patient in the pre-operative holding area, the patient was transferred to the operating room. The patient was positioned supine on the operating room table.  Anesthesia was induced. The popliteal fossae were prepped and draped in standard fashion. A surgical pause was performed confirming correct patient, procedure, and operative location.  Amplatz wires were advanced through the thrombolysis catheters, and the catheters were removed.  The bilateral 5 [place] sheaths were upsized to 8 [place].  Amplatz wires were delivered into the right internal jugular vein.  Intravascular ultrasound was performed over the wire evaluating the perihepatic IVC, perirenal IVC, infrarenal IVC, confluence of the iliac veins and  bilateral common iliac veins, bilateral external iliac veins, bilateral common femoral veins, and bilateral femoral veins.  This initial ultrasound revealed residual chronic thrombus in the confluence of the iliac veins and bilateral common iliac veins.  Bilateral lower extremity venograms were performed from the sheath.  This revealed patent veins below the inguinal ligaments.  Stagnation of flow was noted in the distal external iliac vein/common  femoral vein bilaterally.  Patient was systemically heparinized. Endovascular thrombectomy was performed bilaterally.  The Inari ClotTriever was used to perform thrombectomy.  6 passes were used in the left lower extremity; 3 passes were performed in the right lower extremity.  Significant clot was returned after the first pass in each limb.  Less clot returned thereafter. I repeated the IVUS and showed there was still some common iliac chronic thrombus bilaterally.  Venogram was performed.  This showed a near occlusive chronic thrombus in the left common iliac vein.  I performed an angioplasty of this with a 14 x 40 Atlas balloon.  Follow-up angiogram showed loose chronic thrombus.  I repeated an Inari clot Cherre Blanc pass with good return.  Follow-up venogram shows resolution of the lesion. The right lower extremity pelvis was imaged with venography.  This showed some chronic thrombus in the common iliac vein.  This is not flow-limiting.  I elected to end the case here.  The Inari sheaths were removed from the popliteal veins and a 3-0 nylon stitch with bolster used to achieve hemostasis bilaterally.  Sterile bandages were applied.  Legs were wrapped with Ace wraps.  Conscious sedation was administered with the use of IV fentanyl and midazolam under continuous physician and nurse monitoring.  Heart rate, blood pressure, and oxygen saturation were continuously monitored.  Total sedation time was 86 minutes  Upon completion of the case instrument and sharps counts were  confirmed correct. The patient was transferred to the PACU in good condition. I was present for all portions of the procedure.  PLAN: Resume therapeutic anticoagulation 2 hours postprocedure.  Patient needs a minimum of 6 months of anticoagulation from my standpoint.  Return to floor.  Rande Brunt. Lenell Antu, MD [facility] Phone Number: [phone] 03/24/2021 3:48 PM     PERIPHERAL VASCULAR CATHETERIZATION  Result Date: 03/23/2021 Images from the original result were not included. Patient name: Andrea Hayes MRN: [id number] DOB: [birth year] Sex: female 03/23/2021 Pre-operative Diagnosis: IVC, bilateral common iliac, bilateral external iliac and bilateral common femoral vein DVT Post-operative diagnosis:  Same Surgeon:  Luanna Salk. Randie Heinz, MD Procedure Performed: 1.  Ultrasound-guided cannulation bilateral popliteal veins 2.  IVC venogram 3.  Placement of IVC bilateral common iliac vein, bilateral external iliac vein bilateral common femoral vein and bilateral femoral vein 50 cm treatment length UniFuse catheters 4.  Moderate sedation with fentanyl and Versed for 35 minutes Indications: 30 year old female presented with abdominal pain was found to have extensive DVT from her IVC extending down to her bilateral common femoral veins.  Also has bilateral lower extremity swelling.  She is indicated for lytic catheter placement. Findings: Bilateral popliteal veins were distended but they were compressible.  The did have bleeding when the micropuncture sheath was placed.  By central venogram there did appear to be thrombus extending up to the level of the hepatic veins.  There was difficulty placing a wire past the bilateral common iliac veins into the IVC confluence this required catheter direction.  Bilateral UniFuse catheters were placed that were 50 cm treatment length from the distal IVC down to the mid femoral veins.  Procedure:  The patient was identified in the holding area and  taken to room 8.  The patient was then placed prone on the table and prepped and draped in the usual sterile fashion.  A time out was called.  Ultrasound was used to evaluate first the left popliteal vein.  This was large but was compressible.  There is no spasm facility again cannulated with micropuncture needle followed by wire and sheath.  We then placed a Glidewire.  A 5 French sheath was placed.  Fluoroscopically we advanced the Glidewire to the level of the left common iliac vein.  A Berenstein catheter was placed.  Was directed to the IVC.  At the terminal IVC we performed bleeding venogram which demonstrated thrombus to the level of the hepatic veins.  We placed UniFuse catheter as well as the occluder wire.  We turned our attention to the right leg.  Ultrasound used to identify the popliteal vein on that side.  This also was enlarged although somewhat compressible.  The area again was anesthetized 1% lidocaine cannulated micropuncture needle followed by wire and sheath.  Glidewire was placed followed by 5 [place] sheath.  Again the Glidewire did hang up at the common iliac vein on the right.  We used Berenstein catheter directed the IVC and again perform IVC venogram at the terminal IVC to confirm intraluminal access.  The UniFuse catheter was placed with the occluded wire.  Single shot images were taken centrally and peripherally to demonstrate the placement of the catheter.  Both catheters were flushed.  They will be helped tPA.  She tolerated procedure without any complication. Contrast: 20 cc Brandon C. Randie Heinz, MD [facility] Office: [phone] Pager: [phone]    Scheduled Meds:  Chlorhexidine Gluconate Cloth  6 each Topical Daily   ferrous fumarate-b12-vitamic C-folic acid  1 capsule Oral BID PC   sodium chloride flush  3 mL Intravenous Q12H   Continuous Infusions:  sodium chloride     sodium chloride 100 mL/hr at 03/25/21 0400   heparin 1,400 Units/hr  (03/25/21 0400)     LOS: 3 days   Time spent: 38 minutes More than 50% of the time was spent in counseling/coordination of care  Arnetha Courser, MD [facility]  If 7PM-7AM, please contact night-coverage [email or web address]  03/25/2021, 7:51 AM   This record has been created using Conservation officer, historic buildings. Errors have been sought and corrected,but may not always be located. Such creation errors do not reflect on the standard of care.

## 2021-03-25 NOTE — Progress Notes (Signed)
ANTICOAGULATION CONSULT NOTE - Follow Up Consult  Pharmacy Consult for Heparin >> Xarelto Indication: IVC thrombus + hx PE  No Known Allergies  Patient Measurements: Height: 5\' 5"  (165.1 cm) Weight: 77.1 kg (170 lb) IBW/kg (Calculated) : 57 Heparin Dosing Weight: 73 kg  Vital Signs: Temp: 98.7 F (37.1 C) (09/24 1101) Temp Source: Oral (09/24 1101) BP: 141/95 (09/24 1200) Pulse Rate: 120 (09/24 1200)  Labs: Recent Labs    03/22/21 2020 03/23/21 0530 03/24/21 0021 03/24/21 0622 03/24/21 1933 03/24/21 2245 03/25/21 0034 03/25/21 0844  HGB  --    < > 12.0 11.5*  --  10.9* 10.2*  --   HCT  --    < > 37.5 36.9  --  36.0 31.9*  --   PLT  --    < > 168 164  --  111* 108*  --   LABPROT 12.8  --   --   --   --   --   --   --   INR 1.0  --   --   --   --   --   --   --   HEPARINUNFRC  --    < > <0.10* <0.10*  --   --  0.11* 0.15*  CREATININE  --    < > 0.59  --  0.59  --  0.51  --    < > = values in this interval not displayed.     Estimated Creatinine Clearance: 105.5 mL/min (by C-G formula based on SCr of 0.51 mg/dL).  Medical History: Past Medical History:  Diagnosis Date   Stroke [facility])    Assessment: 89 yoF with hx PE and cerebral thrombosis admitted with IVC thrombus. Pt is noncompliant with enoxaparin/warfarin PTA.   Pt s/p PV lab s/p lysis (stopped 9/23~1400) and now s/p bilateral thrombectomy.   Okay to change to Xarelto per Hospitalist and Vascular - will transition at first dose this evening. Scr 0.51. Hgb 10.2, plt 108. No s/sx of bleeding.   Goal of Therapy:  Monitor platelets by anticoagulation protocol: Yes   Plan:  -Discontinue heparin infusion at start of Xarelto dose -Start Xarelto 15 mg BID for 21 days then 20 mg daily thereafter -Monitor CBC, and for s/sx of bleeding   02-01-1979, PharmD, BCCCP Clinical Pharmacist  Phone: [phone] 03/25/2021 3:15 PM  Please check AMION for all [facility] Pharmacy phone numbers After 10:00 PM, call Main  Pharmacy [phone]

## 2021-03-25 NOTE — Progress Notes (Signed)
ANTICOAGULATION CONSULT NOTE - Follow Up Consult  Pharmacy Consult for Heparin Indication: IVC thrombus + hx PE  No Known Allergies  Patient Measurements: Height: 5\' 5"  (165.1 cm) Weight: 77.1 kg (170 lb) IBW/kg (Calculated) : 57 Heparin Dosing Weight: 73 kg  Vital Signs: Temp: 98.4 F (36.9 C) (09/23 2358) Temp Source: Oral (09/23 2358) BP: 102/68 (09/24 0000) Pulse Rate: 125 (09/24 0000)  Labs: Recent Labs    03/22/21 2020 03/23/21 0530 03/23/21 1557 03/24/21 0021 03/24/21 0622 03/24/21 1933 03/24/21 2245 03/25/21 0034  HGB  --  10.9*   < > 12.0 11.5*  --  10.9* 10.2*  HCT  --  35.0*   < > 37.5 36.9  --  36.0 31.9*  PLT  --  217   < > 168 164  --  111* PENDING  LABPROT 12.8  --   --   --   --   --   --   --   INR 1.0  --   --   --   --   --   --   --   HEPARINUNFRC  --  0.11*   < > <0.10* <0.10*  --   --  0.11*  CREATININE  --  0.62  --  0.59  --  0.59  --   --    < > = values in this interval not displayed.     Estimated Creatinine Clearance: 105.5 mL/min (by C-G formula based on SCr of 0.59 mg/dL).  Medical History: Past Medical History:  Diagnosis Date   Stroke [facility])    Assessment: 23 yoF with hx PE and cerebral thrombosis admitted with IVC thrombus. Pt is noncompliant with enoxaparin/warfarin PTA.   Pt s/p PV lab s/p lysis and now s/p bilateral thrombectomy. Per OR note, heparin to start 2 hours post procedure. Heparin was increased to 1250 units/hr earlier today  Heparin level came back at 0.11 tonight. We will increase the rate then recheck a level in AM  Goal of Therapy:  Heparin level 0.3-0.5 units/ml Monitor platelets by anticoagulation protocol: Yes   Plan:  -Increase heparin 1400 units/hr  -Heparin level in 6 hours and daily wth CBC daily   26, PharmD, BCIDP, AAHIVP, CPP Infectious Disease Pharmacist 03/25/2021 1:25 AM

## 2021-03-25 NOTE — Progress Notes (Signed)
  Progress Note    03/25/2021 9:51 AM 1 Day Post-Op  Subjective: Legs feeling much better, still having abdominal pain  Vitals:   03/25/21 0400 03/25/21 0749  BP:    Pulse: (!) 124   Resp: (!) 23   Temp:    SpO2: 96% 98%    Physical Exam: Awake alert oriented On the respirations Abdomen is soft mild tenderness to palpation Bilateral legs are softer today with compressive wraps in place  CBC    Component Value Date/Time   WBC 11.9 (H) 03/25/2021 0034   RBC 3.71 (L) 03/25/2021 0034   HGB 10.2 (L) 03/25/2021 0034   HGB 13.6 10/31/2020 1343   HCT 31.9 (L) 03/25/2021 0034   HCT 40.7 10/31/2020 1343   PLT 108 (L) 03/25/2021 0034   PLT 148 (L) 10/31/2020 1343   MCV 86.0 03/25/2021 0034   MCV 88 10/31/2020 1343   MCH 27.5 03/25/2021 0034   MCHC 32.0 03/25/2021 0034   RDW 13.8 03/25/2021 0034   RDW 14.2 10/31/2020 1343   LYMPHSABS 2.2 03/24/2021 2245   LYMPHSABS 1.3 05/05/2020 1432   MONOABS 1.3 (H) 03/24/2021 2245   EOSABS 0.0 03/24/2021 2245   EOSABS 0.1 05/05/2020 1432   BASOSABS 0.1 03/24/2021 2245   BASOSABS 0.1 05/05/2020 1432    BMET    Component Value Date/Time   NA 136 03/25/2021 0034   K 3.9 03/25/2021 0034   CL 103 03/25/2021 0034   CO2 26 03/25/2021 0034   GLUCOSE 101 (H) 03/25/2021 0034   BUN <5 (L) 03/25/2021 0034   CREATININE 0.51 03/25/2021 0034   CALCIUM 8.4 (L) 03/25/2021 0034   GFRNONAA >60 03/25/2021 0034   GFRAA >60 01/27/2020 0737    INR    Component Value Date/Time   INR 1.0 03/22/2021 2020     Intake/Output Summary (Last 24 hours) at 03/25/2021 0951 Last data filed at 03/25/2021 4967 Gross per 24 hour  Intake 2790.99 ml  Output 1500 ml  Net 1290.99 ml     Assessment:  30 y.o. female is s/p IVC and bilateral common iliac vein thrombectomy for extensive DVT  Plan: Okay for transfer to floor from vascular standpoint  Patient to be fitted with thigh-high compression socks and compressive dressing can be removed at that  time  She was to be on Lovenox prior to arrival but was noncompliant.  She was breast-feeding she may not continue breast-feeding upon discharge.  She needs to have an anticoagulation regimen that she will be compliant with whether this is Lovenox or DOAC on d/c   Laquinda Moller C. Randie Heinz, MD [facility] Office: [phone] Pager: [phone]  03/25/2021 9:51 AM]

## 2021-03-26 ENCOUNTER — Inpatient Hospital Stay (HOSPITAL_COMMUNITY): Payer: 59

## 2021-03-26 LAB — CBC WITH DIFFERENTIAL/PLATELET
Abs Immature Granulocytes: 0.16 10*3/uL — ABNORMAL HIGH (ref 0.00–0.07)
Basophils Absolute: 0.1 10*3/uL (ref 0.0–0.1)
Basophils Relative: 1 %
Eosinophils Absolute: 0.2 10*3/uL (ref 0.0–0.5)
Eosinophils Relative: 3 %
HCT: 35.3 % — ABNORMAL LOW (ref 36.0–46.0)
Hemoglobin: 11.1 g/dL — ABNORMAL LOW (ref 12.0–15.0)
Immature Granulocytes: 2 %
Lymphocytes Relative: 18 %
Lymphs Abs: 1.5 10*3/uL (ref 0.7–4.0)
MCH: 27.1 pg (ref 26.0–34.0)
MCHC: 31.4 g/dL (ref 30.0–36.0)
MCV: 86.1 fL (ref 80.0–100.0)
Monocytes Absolute: 0.7 10*3/uL (ref 0.1–1.0)
Monocytes Relative: 9 %
Neutro Abs: 5.8 10*3/uL (ref 1.7–7.7)
Neutrophils Relative %: 67 %
Platelets: 101 10*3/uL — ABNORMAL LOW (ref 150–400)
RBC: 4.1 MIL/uL (ref 3.87–5.11)
RDW: 13.4 % (ref 11.5–15.5)
WBC: 8.5 10*3/uL (ref 4.0–10.5)
nRBC: 0 % (ref 0.0–0.2)

## 2021-03-26 LAB — COMPREHENSIVE METABOLIC PANEL
ALT: 45 U/L — ABNORMAL HIGH (ref 0–44)
AST: 42 U/L — ABNORMAL HIGH (ref 15–41)
Albumin: 2.3 g/dL — ABNORMAL LOW (ref 3.5–5.0)
Alkaline Phosphatase: 193 U/L — ABNORMAL HIGH (ref 38–126)
Anion gap: 11 (ref 5–15)
BUN: <5 mg/dL — ABNORMAL LOW (ref 6–20)
CO2: 25 mmol/L (ref 22–32)
Calcium: 9 mg/dL (ref 8.9–10.3)
Chloride: 101 mmol/L (ref 98–111)
Creatinine, Ser: 0.53 mg/dL (ref 0.44–1.00)
GFR, Estimated: >60 mL/min (ref >60)
Glucose, Bld: 87 mg/dL (ref 70–99)
Potassium: 4 mmol/L (ref 3.5–5.1)
Sodium: 137 mmol/L (ref 135–145)
Total Bilirubin: 0.3 mg/dL (ref 0.3–1.2)
Total Protein: 7 g/dL (ref 6.5–8.1)

## 2021-03-26 LAB — GLUCOSE, CAPILLARY
Glucose-Capillary: 92 mg/dL (ref 70–99)
Glucose-Capillary: 104 mg/dL — ABNORMAL HIGH (ref 70–99)
Glucose-Capillary: 92 mg/dL (ref 70–99)
Glucose-Capillary: 88 mg/dL (ref 70–99)

## 2021-03-26 MED ORDER — METOPROLOL TARTRATE 25 MG PO TABS
25.0000 mg | ORAL_TABLET | Freq: Two times a day (BID) | ORAL | Status: DC
  Administered 2021-03-26: 25 mg via ORAL
  Filled 2021-03-26: qty 1

## 2021-03-26 MED ORDER — IOHEXOL 350 MG/ML SOLN
100.0000 mL | Freq: Once | INTRAVENOUS | Status: AC | PRN
  Administered 2021-03-26: 100 mL via INTRAVENOUS

## 2021-03-26 NOTE — Progress Notes (Signed)
  Progress Note    03/26/2021 10:10 AM 2 Days Post-Op  Subjective: Feeling much better today  Vitals:   03/26/21 0800 03/26/21 0900  BP: 124/90 120/87  Pulse: (!) 110 (!) 112  Resp: (!) 23 (!) 23  Temp:    SpO2: 98% 98%    Physical Exam: Awake alert oriented Abdomen is soft and less tender today Dressings on bilateral lower extremities removed all compartments are soft and much less edema than previous  CBC    Component Value Date/Time   WBC 8.5 03/26/2021 0409   RBC 4.10 03/26/2021 0409   HGB 11.1 (L) 03/26/2021 0409   HGB 13.6 10/31/2020 1343   HCT 35.3 (L) 03/26/2021 0409   HCT 40.7 10/31/2020 1343   PLT 101 (L) 03/26/2021 0409   PLT 148 (L) 10/31/2020 1343   MCV 86.1 03/26/2021 0409   MCV 88 10/31/2020 1343   MCH 27.1 03/26/2021 0409   MCHC 31.4 03/26/2021 0409   RDW 13.4 03/26/2021 0409   RDW 14.2 10/31/2020 1343   LYMPHSABS 1.5 03/26/2021 0409   LYMPHSABS 1.3 05/05/2020 1432   MONOABS 0.7 03/26/2021 0409   EOSABS 0.2 03/26/2021 0409   EOSABS 0.1 05/05/2020 1432   BASOSABS 0.1 03/26/2021 0409   BASOSABS 0.1 05/05/2020 1432    BMET    Component Value Date/Time   NA 137 03/26/2021 0409   K 4.0 03/26/2021 0409   CL 101 03/26/2021 0409   CO2 25 03/26/2021 0409   GLUCOSE 87 03/26/2021 0409   BUN <5 (L) 03/26/2021 0409   CREATININE 0.53 03/26/2021 0409   CALCIUM 9.0 03/26/2021 0409   GFRNONAA >60 03/26/2021 0409   GFRAA >60 01/27/2020 0737    INR    Component Value Date/Time   INR 1.0 03/22/2021 2020     Intake/Output Summary (Last 24 hours) at 03/26/2021 1010 Last data filed at 03/26/2021 0800 Gross per 24 hour  Intake 2579.71 ml  Output 3850 ml  Net -1270.29 ml     Assessment:  30 y.o. female is s/p: IVC and bilateral iliac thrombectomy for extensive DVT  Plan: Continue heparin drip, plan will be for Xarelto on discharge  Fit for thigh-high stockings  Okay for transfer out of ICU from vascular standpoint  Andrea Hayes C. Randie Heinz,  MD [facility] [place] Office: [phone] Pager: [phone]  03/26/2021 10:10 AM

## 2021-03-26 NOTE — Discharge Instructions (Addendum)
Information on my medicine - XARELTO (rivaroxaban)  This medication education was reviewed with me or my healthcare representative as part of my discharge preparation.   WHY WAS XARELTO PRESCRIBED FOR YOU? Xarelto was prescribed to treat blood clots that may have been found in the veins of your legs (deep vein thrombosis) or in your lungs (pulmonary embolism) and to reduce the risk of them occurring again.  What do you need to know about Xarelto? The starting dose is one 15 mg tablet taken TWICE daily with food for the FIRST 21 DAYS then on 04/15/21  the dose is changed to one 20 mg tablet taken ONCE A DAY with your evening meal.  DO NOT stop taking Xarelto without talking to the health care provider who prescribed the medication.  Refill your prescription for 20 mg tablets before you run out.  After discharge, you should have regular check-up appointments with your healthcare provider that is prescribing your Xarelto.  In the future your dose may need to be changed if your kidney function changes by a significant amount.  What do you do if you miss a dose? If you are taking Xarelto TWICE DAILY and you miss a dose, take it as soon as you remember. You may take two 15 mg tablets (total 30 mg) at the same time then resume your regularly scheduled 15 mg twice daily the next day.  If you are taking Xarelto ONCE DAILY and you miss a dose, take it as soon as you remember on the same day then continue your regularly scheduled once daily regimen the next day. Do not take two doses of Xarelto at the same time.   Important Safety Information Xarelto is a blood thinner medicine that can cause bleeding. You should call your healthcare provider right away if you experience any of the following: Bleeding from an injury or your nose that does not stop. Unusual colored urine (red or dark brown) or unusual colored stools (red or black). Unusual bruising for unknown reasons. A serious fall or if you  hit your head (even if there is no bleeding).  Some medicines may interact with Xarelto and might increase your risk of bleeding while on Xarelto. To help avoid this, consult your healthcare provider or pharmacist prior to using any new prescription or non-prescription medications, including herbals, vitamins, non-steroidal anti-inflammatory drugs (NSAIDs) and supplements.  This website has more information on Xarelto: VisitDestination.com.br.  20-34mmHg thigh high compression. Recommend wearing them all day. Take them off for bed at night. Continue this daily at least until we see the patient in follow up which is on 11/1

## 2021-03-26 NOTE — Progress Notes (Signed)
PROGRESS NOTE    Andrea Hayes  OYD:[id number] DOB: Jan 30, 1991 DOA: 03/22/2021 PCP: Fleet Contras, MD   Brief Narrative: Taken from H&P. Andrea Hayes is a 30 y.o. female with history of cerebral thrombosis, idiopathic intracranial hypertension, history of CVA and recently admitted in July 2022 for pulmonary embolism on Lovenox which patient has not been very compliant has missed multiple doses presents to the ER with complaints of flank and back pain and epigastric pain for the last 2 weeks.   On arrival she was found to have mild leukocytosis, abnormal liver enzymes, CT abdomen and pelvis with occlusive thrombosis involving infrarenal IVC, bilateral common iliac, right external iliac. Vascular surgery was consulted and she was started on heparin infusion, she was taken to the OR and bilateral lower extremity catheter was placed for thrombolysis with tPA.  Patient had 2 admissions earlier this year 1 in July 2022 with PE and 1 and May 2022 for intracerebral thrombosis.Hypercoagulable work-up was unremarkable including  homocystein 4.8, antithrombin III mildly elevated at 128, low protein S activity of 22, total protein S 109, protein C activity 180, total protein C 127, lupus anticoagulant 25.3, DRVVT 44.7, beta-2-glycoprotein antibodies <9, factor V leiden mutation negative, prothrombin gene mutation negative, cardiolipin antibodies negative, sickle cell screen negative.She was discharged on Coumadin with Lovenox bridge, doubt she was taking it as INR was 1 on admission.  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  9/24: Patient underwent mechanical thrombectomy with vascular surgery on 03/24/2021, extensive thrombosis involving the IVC and bilateral iliac arteries were done.  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  9/25: Patient developed new onset substernal pleuritic  chest pain, remained tachycardic and tachypneic, repeat CTA which shows a new occlusive appearing PE in the anterior segmental pulmonary artery of right upper lobe.   Subjective: Patient is complaining of substernal pleuritic chest pain, pain woke her up around 4 AM.  Increased with deep breathing.  She was little tachycardic and tachypneic, saturating well on room air.  Assessment & Plan:   Principal Problem:   IVC thrombosis (HCC) Active Problems:   Idiopathic intracranial hypertension   Elevated liver enzymes   DVT (deep venous thrombosis) (HCC)  IVC thrombosis.  Patient had extensive intravenous thrombosis, involving infrarenal IVC, bilateral common iliac, right external iliac, extending up to the level of hepatic vein, s/p thrombectomy and thrombolysis.  History of idiopathic thrombosis, negative hypercoagulable work-up done in May 2022. 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 -Per vascular surgery will need at least 6 months of anticoagulation. -Continue to monitor -We will discuss with vascular if she can be discharged on NOAC for monitoring and compliance issues-talked with Dr. Randie Heinz and we can start her on NOAC today. -Pharmacy concern for Xarelto. -She will need extensive counseling and education in. -Should see an hematologist as an outpatient for further work-up, preferably at a specialized center.  New onset PE.  Due to her complaint of new onset pleuritic chest pain along with sinus tachycardia and tachypnea we repeated the CTA and found to have a new occlusive appearing PE in the anterior segmental pulmonary artery of right upper lobe.  Saturating well on room air.  She is already on anticoagulation. -Monitor for another day -  Continue with Xarelto -If remains stable can be  discharged home tomorrow.  Sinus tachycardia.  Can be due to pain.  TSH within normal limit.  New onset PE might be playing a role as she was little tachypneic too. -Pain management -Increase the dose of metoprolol to 25 mg twice daily and monitor. -Monitor  Elevated liver enzymes.  Improving. Seems to have chronically elevated liver enzymes with some improvement as compared to prior during recent admission in July 2022.  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  Acute hepatitis panel was negative at that time. -Monitor liver function -Avoid hepatotoxins.  Abdominal pain.  Seems improved with Norco. -Continue with pain management  History of idiopathic intracranial hypertension.  No acute concern or headaches.  Takes acetazolamide as needed. -Can continue acetazolamide as needed. -Need to have a close follow-up with neurology as an outpatient  Normocytic anemia.  Seems stable, mild decreased after the procedure yesterday.  Hemoglobin at 10.2. Anemia panel with iron deficiency and borderline B12 levels.  She was started on supplement. -Continue iron and B12 supplement- -Continue to monitor   Objective: Vitals:   03/26/21 0730 03/26/21 0800 03/26/21 0900 03/26/21 1100  BP:  124/90 120/87   Pulse:  (!) 110 (!) 112   Resp:  (!) 23 (!) 23   Temp: 98.3 F (36.8 C)   98 F (36.7 C)  TempSrc: Oral   Oral  SpO2:  98% 98%   Weight:      Height:        Intake/Output Summary (Last 24 hours) at 03/26/2021 1452 Last data filed at 03/26/2021 0800 Gross per 24 hour  Intake 2579.71 ml  Output 3850 ml  Net -1270.29 ml    Filed Weights   03/22/21 2342  Weight: 77.1 kg    Examination:  General.  Well-developed lady, in no acute distress. Pulmonary.  Lungs clear bilaterally, normal respiratory effort, mild tachypnea CV.  Sinus tachycardia Abdomen.  Soft, nontender, nondistended, BS positive. CNS.  Alert  and oriented .  No focal neurologic deficit. Extremities.  No edema, no cyanosis, pulses intact and symmetrical. Psychiatry.  Judgment and insight appears normal.   DVT prophylaxis: Xarelto Code Status: Full Family Communication: Discussed with patient  Disposition Plan:  Status is: Inpatient  Remains inpatient appropriate because:Inpatient level of care appropriate due to severity of illness  Dispo: The patient is from: Home              Anticipated d/c is to: Home              Patient currently is not medically stable to d/c.   Difficult to place patient No              Level of care: ICU  All the records are reviewed and case discussed with Care Management/Social Worker. Management plans discussed with the patient, nursing and they are in agreement.  Consultants:  Vascular surgery  Procedures:  Antimicrobials:   Data Reviewed: I have personally reviewed following labs and imaging studies  CBC: Recent Labs  Lab 03/23/21 0530 03/23/21 1557 03/24/21 0021 03/24/21 0622 03/24/21 2245 03/25/21 0034 03/26/21 0409  WBC 12.6*   < > 14.0* 12.5* 13.6* 11.9* 8.5  NEUTROABS 9.9*  --   --   --  10.0*  --  5.8  HGB 10.9*   < > 12.0 11.5* 10.9* 10.2* 11.1*  HCT 35.0*   < >  37.5 36.9 36.0 31.9* 35.3*  MCV 87.1   < > 86.0 86.6 86.7 86.0 86.1  PLT 217   < > 168 164 111* 108* 101*   < > = values in this interval not displayed.    Basic Metabolic Panel: Recent Labs  Lab 03/23/21 0530 03/24/21 0021 03/24/21 1933 03/25/21 0034 03/26/21 0409  NA 135 134* 136 136 137  K 3.9 3.9 4.3 3.9 4.0  CL 101 101 103 103 101  CO2 24 23 23 26 25   GLUCOSE 100* 100* 72 101* 87  BUN 5* <5* 5* <5* <5*  CREATININE 0.62 0.59 0.59 0.51 0.53  CALCIUM 8.9 8.9 9.0 8.4* 9.0    GFR: Estimated Creatinine Clearance: 105.5 mL/min (by C-G formula based on SCr of 0.53 mg/dL). Liver Function Tests: Recent Labs  Lab 03/22/21 0820 03/23/21 0530 03/24/21 0021 03/25/21 0034 03/26/21 0409  AST 168*  63* 33 19 42*  ALT 169* 105* 73* 37 45*  ALKPHOS 282* 248* 237* 167* 193*  BILITOT 0.6 0.6 0.5 0.7 0.3  PROT 8.2* 7.5 7.6 6.1* 7.0  ALBUMIN 3.1* 2.5* 2.5* 1.9* 2.3*    Recent Labs  Lab 03/22/21 0820  LIPASE 29    No results for input(s): AMMONIA in the last 168 hours. Coagulation Profile: Recent Labs  Lab 03/22/21 2020  INR 1.0    Cardiac Enzymes: No results for input(s): CKTOTAL, CKMB, CKMBINDEX, TROPONINI in the last 168 hours. BNP (last 3 results) No results for input(s): PROBNP in the last 8760 hours. HbA1C: No results for input(s): HGBA1C in the last 72 hours. CBG: Recent Labs  Lab 03/25/21 0614 03/25/21 1059 03/25/21 1816 03/26/21 0346 03/26/21 1105  GLUCAP 100* 115* 100* 92 104*    Lipid Profile: No results for input(s): CHOL, HDL, LDLCALC, TRIG, CHOLHDL, LDLDIRECT in the last 72 hours. Thyroid Function Tests: Recent Labs    03/25/21 0844  TSH 4.265   Anemia Panel: No results for input(s): VITAMINB12, FOLATE, FERRITIN, TIBC, IRON, RETICCTPCT in the last 72 hours.  Sepsis Labs: No results for input(s): PROCALCITON, LATICACIDVEN in the last 168 hours.  Recent Results (from the past 240 hour(s))  Resp Panel by RT-PCR (Flu A&B, Covid) Nasopharyngeal Swab     Status: None   Collection Time: 03/22/21 10:08 PM   Specimen: Nasopharyngeal Swab; Nasopharyngeal(NP) swabs in vial transport medium  Result Value Ref Range Status   SARS Coronavirus 2 by RT PCR NEGATIVE NEGATIVE Final    Comment: (NOTE) SARS-CoV-2 target nucleic acids are NOT DETECTED.  The SARS-CoV-2 RNA is generally detectable in upper respiratory specimens during the acute phase of infection. The lowest concentration of SARS-CoV-2 viral copies this assay can detect is 138 copies/mL. A negative result does not preclude SARS-Cov-2 infection and should not be used as the sole basis for treatment or other patient management decisions. A negative result may occur with  improper specimen  collection/handling, submission of specimen other than nasopharyngeal swab, presence of viral mutation(s) within the areas targeted by this assay, and inadequate number of viral copies(<138 copies/mL). A negative result must be combined with clinical observations, patient history, and epidemiological information. The expected result is Negative.  Fact Sheet for Patients:  03/24/21  Fact Sheet for Healthcare Providers:  BloggerCourse.com  This test is no t yet approved or cleared by the [place] FDA and  has been authorized for detection and/or diagnosis of SARS-CoV-2 by FDA under an Emergency Use Authorization (EUA). This EUA will remain  in effect (meaning this  test can be used) for the duration of the COVID-19 declaration under Section 564(b)(1) of the Act, 21 U.S.C.section 360bbb-3(b)(1), unless the authorization is terminated  or revoked sooner.       Influenza A by PCR NEGATIVE NEGATIVE Final   Influenza B by PCR NEGATIVE NEGATIVE Final    Comment: (NOTE) The Xpert Xpress SARS-CoV-2/FLU/RSV plus assay is intended as an aid in the diagnosis of influenza from Nasopharyngeal swab specimens and should not be used as a sole basis for treatment. Nasal washings and aspirates are unacceptable for Xpert Xpress SARS-CoV-2/FLU/RSV testing.  Fact Sheet for Patients: BloggerCourse.com  Fact Sheet for Healthcare Providers: SeriousBroker.it  This test is not yet approved or cleared by the [place] FDA and has been authorized for detection and/or diagnosis of SARS-CoV-2 by FDA under an Emergency Use Authorization (EUA). This EUA will remain in effect (meaning this test can be used) for the duration of the COVID-19 declaration under Section 564(b)(1) of the Act, 21 U.S.C. section 360bbb-3(b)(1), unless the authorization is terminated or revoked.  Performed at [facility], [address] [place], [place] [address]       Radiology Studies: CT Angio Chest Pulmonary Embolism (PE) W or WO Contrast  Result Date: 03/26/2021 CLINICAL DATA:  22 yoF with hx PE and cerebral thrombosis admitted with IVC thrombus. Pt is noncompliant with enoxaparin/warfarin PTA. EXAM: CT ANGIOGRAPHY CHEST WITH CONTRAST TECHNIQUE: Multidetector CT imaging of the chest was performed using the standard protocol during bolus administration of intravenous contrast. Multiplanar CT image reconstructions and MIPs were obtained to evaluate the vascular anatomy. CONTRAST:  OMNIPAQUE IOHEXOL 350 MG/ML SOLN COMPARISON:  01/23/2021. FINDINGS: Cardiovascular: Pulmonary arteries are well opacified. Focal pulmonary embolus noted in the anterior segmental branch to the right upper lobe, not evident on the prior CT. This may reflect distal propagation of prior thrombus or a new embolus. Pulmonary emboli noted to the right lower lobe on the prior study have resolved. There is no other evidence of pulmonary emboli. Heart is normal in size and configuration. No pericardial effusion. Great vessels are normal in caliber. Aberrant right subclavian artery. No aortic dissection or atherosclerosis. Mediastinum/Nodes: Prominent left prevascular lymph node, 1.1 cm short axis. Prominent right axillary lymph nodes, largest 1 cm short axis. These findings are stable. No mediastinal or hilar masses. Trachea and esophagus are unremarkable. Lungs/Pleura: Trace pleural effusions. Minor dependent subsegmental atelectasis in the lower lobes, right lower lobe atelectasis significantly improved from the prior CT. Lung volumes are low, study acquired in expiration. No evidence of pneumonia or pulmonary edema. No pneumothorax. Upper Abdomen: Unremarkable. Musculoskeletal: Normal. Review of the MIP images confirms the above findings. IMPRESSION: 1. Occlusive appearing pulmonary embolus in the anterior segmental pulmonary  artery of the right upper lobe, not evident on the prior CTA. 2. No other evidence of new pulmonary emboli. 3. Previously noted pulmonary emboli have resolved. 4. Significant interval improvement in right lower lobe atelectasis. Minimal atelectasis persisting in the dependent lung bases. Trace pleural effusions. Electronically Signed   By: Amie Portland M.D.   On: 03/26/2021 12:40    Scheduled Meds:  Chlorhexidine Gluconate Cloth  6 each Topical Daily   ferrous fumarate-b12-vitamic C-folic acid  1 capsule Oral BID PC   metoprolol tartrate  25 mg Oral BID   Rivaroxaban  15 mg Oral BID WC   Followed by   Melene Muller ON 04/15/2021] rivaroxaban  20 mg Oral Q supper   sodium chloride flush  3 mL  Intravenous Q12H   Continuous Infusions:  sodium chloride     sodium chloride 100 mL/hr at 03/26/21 0600     LOS: 4 days   Time spent: 43 minutes More than 50% of the time was spent in counseling/coordination of care  Arnetha Courser, MD [facility]  If 7PM-7AM, please contact night-coverage [email or web address]  03/26/2021, 2:52 PM   This record has been created using Conservation officer, historic buildings. Errors have been sought and corrected,but may not always be located. Such creation errors do not reflect on the standard of care.

## 2021-03-27 ENCOUNTER — Other Ambulatory Visit: Payer: Self-pay

## 2021-03-27 ENCOUNTER — Other Ambulatory Visit (HOSPITAL_COMMUNITY): Payer: Self-pay

## 2021-03-27 DIAGNOSIS — I82409 Acute embolism and thrombosis of unspecified deep veins of unspecified lower extremity: Secondary | ICD-10-CM

## 2021-03-27 DIAGNOSIS — J9601 Acute respiratory failure with hypoxia: Secondary | ICD-10-CM

## 2021-03-27 DIAGNOSIS — G08 Intracranial and intraspinal phlebitis and thrombophlebitis: Secondary | ICD-10-CM

## 2021-03-27 LAB — COMPREHENSIVE METABOLIC PANEL
ALT: 39 U/L (ref 0–44)
AST: 45 U/L — ABNORMAL HIGH (ref 15–41)
Albumin: 2.2 g/dL — ABNORMAL LOW (ref 3.5–5.0)
Alkaline Phosphatase: 175 U/L — ABNORMAL HIGH (ref 38–126)
Anion gap: 11 (ref 5–15)
BUN: <5 mg/dL — ABNORMAL LOW (ref 6–20)
CO2: 25 mmol/L (ref 22–32)
Calcium: 8.9 mg/dL (ref 8.9–10.3)
Chloride: 99 mmol/L (ref 98–111)
Creatinine, Ser: 0.57 mg/dL (ref 0.44–1.00)
GFR, Estimated: >60 mL/min (ref >60)
Glucose, Bld: 100 mg/dL — ABNORMAL HIGH (ref 70–99)
Potassium: 4 mmol/L (ref 3.5–5.1)
Sodium: 135 mmol/L (ref 135–145)
Total Bilirubin: 0.7 mg/dL (ref 0.3–1.2)
Total Protein: 6.6 g/dL (ref 6.5–8.1)

## 2021-03-27 LAB — GLUCOSE, CAPILLARY
Glucose-Capillary: 75 mg/dL (ref 70–99)
Glucose-Capillary: 91 mg/dL (ref 70–99)

## 2021-03-27 LAB — CBC
HCT: 33.5 % — ABNORMAL LOW (ref 36.0–46.0)
Hemoglobin: 10.4 g/dL — ABNORMAL LOW (ref 12.0–15.0)
MCH: 26.4 pg (ref 26.0–34.0)
MCHC: 31 g/dL (ref 30.0–36.0)
MCV: 85 fL (ref 80.0–100.0)
Platelets: 97 10*3/uL — ABNORMAL LOW (ref 150–400)
RBC: 3.94 MIL/uL (ref 3.87–5.11)
RDW: 13.3 % (ref 11.5–15.5)
WBC: 7.4 10*3/uL (ref 4.0–10.5)
nRBC: 0.7 % — ABNORMAL HIGH (ref 0.0–0.2)

## 2021-03-27 MED ORDER — METOPROLOL TARTRATE 12.5 MG HALF TABLET
12.5000 mg | ORAL_TABLET | Freq: Two times a day (BID) | ORAL | Status: DC
  Administered 2021-03-27: 12.5 mg via ORAL
  Filled 2021-03-27: qty 1

## 2021-03-27 MED ORDER — RIVAROXABAN 20 MG PO TABS: 20.0000 mg | ORAL_TABLET | Freq: Every day | ORAL | 6 refills | Status: DC

## 2021-03-27 MED ORDER — RIVAROXABAN (XARELTO) VTE STARTER PACK (15 & 20 MG): ORAL_TABLET | ORAL | 0 refills | Status: DC

## 2021-03-27 MED ORDER — HYDROCODONE-ACETAMINOPHEN 5-325 MG PO TABS: 1.0000 | ORAL_TABLET | ORAL | 0 refills | Status: DC | PRN

## 2021-03-27 MED ORDER — FE FUMARATE-B12-VIT C-FA-IFC PO CAPS: 1.0000 | ORAL_CAPSULE | Freq: Two times a day (BID) | ORAL | 0 refills | Status: DC

## 2021-03-27 MED ORDER — ALBUMIN HUMAN 5 % IV SOLN
INTRAVENOUS | Status: AC
  Filled 2021-03-27: qty 250

## 2021-03-27 MED ORDER — METOPROLOL SUCCINATE ER 25 MG PO TB24: 25.0000 mg | ORAL_TABLET | Freq: Every day | ORAL | 1 refills | Status: DC

## 2021-03-27 NOTE — Progress Notes (Addendum)
[facility]  Subjective  - Doing well no new complaints   Objective 115/82 (!) 101 98.8 F (37.1 C) (Oral) (!) 22 98%  Intake/Output Summary (Last 24 hours) at 03/27/2021 0747 Last data filed at 03/27/2021 0400 Gross per 24 hour  Intake 232.71 ml  Output 2650 ml  Net -2417.29 ml    Brisk doppler PT/DP B LE Suture removed B post popliteal area. No ischemic skin changes B LE Heart Tachy 101 bpm Lungs non labored brething   Assessment/Planning: PROCEDURE:   1) bilateral vena cava, common iliac, external iliac, common femoral, femoral vein intravascular ultrasound 2) bilateral iliofemoral venogram ( 3) bilateral vena cava, iliac, femoral endovascular thrombectomy (Inari ClotTriever) 4) right common iliac vein angioplasty (14 x 38mm Atlas) 5) conscious sedation (86 minutes).  B LE with good inflow, lees LE edema Compartments soft, pending true 20-30 mm hg compression thigh high Mobility ok from a vascular point of view plan will be for Xarelto on discharge  IMPRESSION: 1. Occlusive appearing pulmonary embolus in the anterior segmental pulmonary artery of the right upper lobe, not evident on the prior CTA. 2. No other evidence of new pulmonary emboli. 3. Previously noted pulmonary emboli have resolved. 4. Significant interval improvement in right lower lobe atelectasis. Minimal atelectasis persisting in the dependent lung bases. Trace pleural effusions. Cardiovascular: Pulmonary arteries are well opacified. Focal pulmonary embolus noted in the anterior segmental branch to the right upper lobe, not evident on the prior CT. This may reflect distal propagation of prior thrombus or a new embolus.    Stable from vascular point of view  Andrea Hayes 03/27/2021 7:47 AM --  Laboratory Lab Results: Recent Labs    03/25/21 0034 03/26/21 0409  WBC 11.9* 8.5  HGB 10.2* 11.1*  HCT 31.9* 35.3*  PLT 108* 101*   BMET Recent Labs     03/26/21 0409 03/27/21 0124  NA 137 135  K 4.0 4.0  CL 101 99  CO2 25 25  GLUCOSE 87 100*  BUN <5* <5*  CREATININE 0.53 0.57  CALCIUM 9.0 8.9    COAG Lab Results  Component Value Date   INR 1.0 03/22/2021   INR 1.7 (H) 01/26/2021   INR 1.2 01/25/2021   No results found for: PTT  I agree with the above.  I have seen and evaluated patient.  She appears to be resting comfortably.  She denies any chest pain.  Her abdominal pain has resolved.  She does not have any leg pain.  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  She can be transition to oral anticoagulation.  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  She should get a hypercoagulable work-up and follow-up with hematology at discharge.  PE: PE from the summer has resolved.  She now has a new right upper lobe thrombus.  She remains tachycardic but does not have chest pain.  She is not requiring oxygen.  I doubt this will require intervention.  Sutures were removed from the popliteal space bilaterally today.  She will need to be fitted for thigh-high 20-30 compression stockings.  She needs to ambulate.  She is stable for transfer to the floor.  Andrea Hayes

## 2021-03-27 NOTE — Discharge Summary (Signed)
Physician Discharge Summary  Andrea Hayes WUJ:[id number] DOB: [birth year] DOA: 03/22/2021  PCP: Fleet Contras, MD  Admit date: 03/22/2021 Discharge date: 03/27/2021  Admitted From: Home Disposition: Home  Recommendations for Outpatient Follow-up:  Follow up with PCP in 1-2 weeks Follow-up with vascular surgery in 2 weeks Please obtain BMP/CBC in one week Please follow up on the following pending results: None  Home Health: No Equipment/Devices: None Discharge Condition: Stable CODE STATUS: Full Diet recommendation: Heart Healthy   Brief/Interim Summary: Andrea Hayes is a 30 y.o. female with history of cerebral thrombosis, idiopathic intracranial hypertension, history of CVA and recently admitted in July 2022 for pulmonary embolism on Lovenox which patient has not been very compliant has missed multiple doses presents to the ER with complaints of flank and back pain and epigastric pain for the last 2 weeks.    On arrival she was found to have mild leukocytosis, abnormal liver enzymes, CT abdomen and pelvis with occlusive thrombosis involving infrarenal IVC, bilateral common iliac, right external iliac. Vascular surgery was consulted and she was started on heparin infusion, she was taken to the OR and bilateral lower extremity catheter was placed for thrombolysis with tPA.  After 24 hours of tPA she was taken to the OR for mechanical thrombectomy with vascular surgery on 03/24/2021.  During angiography she was found to have extensive thrombosis  involving the IVC and bilateral iliac arteries were done.  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  She was initially managed with heparin infusion and then later transitioned to Xarelto for the ease of use as there was compliance issues with Coumadin before.  Patient was lactating but willing to stop nursing her baby to continue with Xarelto.  On 03/26/2021 she developed pleuritic substernal  chest pain along with some tachycardia and tachypnea, a repeat CTA was done which shows a new occlusive appearing PE in the anterior segmental pulmonary artery of right upper lobe.  Prior PEs seems improving.  Patient remained on room air at rest and with ambulation.  Vascular surgery also evaluated her and there is no need for any intervention at this time as she is already on anticoagulation.  Patient had 2 admissions earlier this year 1 in July 2022 with PE and 1 and May 2022 for intracerebral thrombosis.Hypercoagulable work-up was unremarkable including  homocystein 4.8, antithrombin III mildly elevated at 128, low protein S activity of 22, total protein S 109, protein C activity 180, total protein C 127, lupus anticoagulant 25.3, DRVVT 44.7, beta-2-glycoprotein antibodies <9, factor V leiden mutation negative, prothrombin gene mutation negative, cardiolipin antibodies negative, sickle cell screen negative.She was discharged on Coumadin with Lovenox bridge, patient was noncompliant with that regimen.  Patient will most likely require lifelong anticoagulation due to multiple episodes of thrombosis.  As her common thrombosis work-up was inconclusive or negative.  She might get benefit going to a tertiary care center with a specialized clinic for thrombosis to get further work-up.  Because of her tachycardia which can be due to PE, TSH was checked and it was within normal limit.  She also had mildly elevated blood pressure and was started on low-dose metoprolol. Her PCP should be able to follow-up and stop or uptitrated as needed.  She was found to have an elevated liver enzymes which seems improving on discharge. Seems to have chronically elevated liver enzymes with some improvement as compared to prior during recent admission in July 2022.  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  Acute hepatitis panel was negative at that  time.  Discharge Diagnoses:  Principal Problem:   IVC thrombosis (HCC) Active Problems:   Idiopathic intracranial hypertension   Elevated liver enzymes   DVT (deep venous thrombosis) [facility])   Discharge Instructions  Discharge Instructions     Diet - low sodium heart healthy   Complete by: As directed    Discharge instructions   Complete by: As directed    It was pleasure taking care of you. You are being started on a new blood thinner called Xarelto, you will start with your starter pack which include 2 different types of pills, you will take 15 mg twice a day for 21 days and then start taking 20 mg daily, you can fill up your other prescription which is 20 mg daily after starting the starter pack. It is very important that you take it regularly at the same time of the day and do not miss your doses as you have multiple episodes of clotting which can be life-threatening or debilitating. Please follow-up with an hematologist at a specialized center like [facility] or [facility] for further investigation of your recurrent clotting as all of your initial testing was within normal limit. You are also being started on metoprolol to help with your heart rate and blood pressure.  Please follow-up closely with your primary care doctor for further recommendations.   Increase activity slowly   Complete by: As directed       Allergies as of 03/27/2021   No Known Allergies      Medication List     STOP taking these medications    enoxaparin 80 MG/0.8ML injection Commonly known as: LOVENOX   warfarin 7.5 MG tablet Commonly known as: Coumadin       TAKE these medications    acetaminophen 325 MG tablet Commonly known as: TYLENOL Take 2 tablets (650 mg total) by mouth every 6 (six) hours as needed for mild pain or headache (do not take over 3000 mg in a day of tylenol. please note there is tylenol in the fiorecet).   acetaZOLAMIDE 125 MG tablet Commonly known as: DIAMOX Take 2  tablets (250 mg total) by mouth 2 (two) times daily. What changed:  when to take this reasons to take this   ferrous fumarate-b12-vitamic C-folic acid capsule Commonly known as: TRINSICON / FOLTRIN Take 1 capsule by mouth 2 (two) times daily after a meal.   HYDROcodone-acetaminophen 5-325 MG tablet Commonly known as: NORCO/VICODIN Take 1-2 tablets by mouth every 4 (four) hours as needed for moderate pain or severe pain.   metoprolol succinate 25 MG 24 hr tablet Commonly known as: TOPROL-XL Take 1 tablet (25 mg total) by mouth daily.   Prenatal Vitamin 27-0.8 MG Tabs Take 1 tablet by mouth daily.   Rivaroxaban Stater Pack (15 mg and 20 mg) Commonly known as: XARELTO STARTER PACK Follow package directions: Take one 15mg  tablet by mouth twice a day. On day 22, switch to one 20mg  tablet once a day. Take with food.   rivaroxaban 20 MG Tabs tablet Commonly known as: XARELTO Take 1 tablet (20 mg total) by mouth daily with supper. Start taking on: April 15, 2021   Vitamin D (Ergocalciferol) 1.25 MG (50000 UNIT) Caps capsule Commonly known as: DRISDOL Take 50,000 Units by mouth once a week.        Follow-up Information  Fleet Contras, MD. Schedule an appointment as soon as possible for a visit in 1 week(s).   Specialty: Internal Medicine Contact information: [address] [place] [address] [phone]         Nada Libman, MD. Schedule an appointment as soon as possible for a visit in 2 week(s).   Specialties: Vascular Surgery, Cardiology Contact information: [address] [place] [address] [phone]                No Known Allergies  Consultations: Vascular surgery  Procedures/Studies: CT Angio Chest Pulmonary Embolism (PE) W or WO Contrast  Result Date: 03/26/2021 CLINICAL DATA:  88 yoF with hx PE and cerebral thrombosis admitted with IVC thrombus. Pt is noncompliant with enoxaparin/warfarin PTA. EXAM: CT ANGIOGRAPHY CHEST WITH  CONTRAST TECHNIQUE: Multidetector CT imaging of the chest was performed using the standard protocol during bolus administration of intravenous contrast. Multiplanar CT image reconstructions and MIPs were obtained to evaluate the vascular anatomy. CONTRAST:  OMNIPAQUE IOHEXOL 350 MG/ML SOLN COMPARISON:  01/23/2021. FINDINGS: Cardiovascular: Pulmonary arteries are well opacified. Focal pulmonary embolus noted in the anterior segmental branch to the right upper lobe, not evident on the prior CT. This may reflect distal propagation of prior thrombus or a new embolus. Pulmonary emboli noted to the right lower lobe on the prior study have resolved. There is no other evidence of pulmonary emboli. Heart is normal in size and configuration. No pericardial effusion. Great vessels are normal in caliber. Aberrant right subclavian artery. No aortic dissection or atherosclerosis. Mediastinum/Nodes: Prominent left prevascular lymph node, 1.1 cm short axis. Prominent right axillary lymph nodes, largest 1 cm short axis. These findings are stable. No mediastinal or hilar masses. Trachea and esophagus are unremarkable. Lungs/Pleura: Trace pleural effusions. Minor dependent subsegmental atelectasis in the lower lobes, right lower lobe atelectasis significantly improved from the prior CT. Lung volumes are low, study acquired in expiration. No evidence of pneumonia or pulmonary edema. No pneumothorax. Upper Abdomen: Unremarkable. Musculoskeletal: Normal. Review of the MIP images confirms the above findings. IMPRESSION: 1. Occlusive appearing pulmonary embolus in the anterior segmental pulmonary artery of the right upper lobe, not evident on the prior CTA. 2. No other evidence of new pulmonary emboli. 3. Previously noted pulmonary emboli have resolved. 4. Significant interval improvement in right lower lobe atelectasis. Minimal atelectasis persisting in the dependent lung bases. Trace pleural effusions. Electronically Signed   By:  Amie Portland M.D.   On: 03/26/2021 12:40   CT Abdomen Pelvis W Contrast  Result Date: 03/22/2021 CLINICAL DATA:  Abdominal infection suspected. EXAM: CT ABDOMEN AND PELVIS WITH CONTRAST TECHNIQUE: Multidetector CT imaging of the abdomen and pelvis was performed using the standard protocol following bolus administration of intravenous contrast. CONTRAST:  20mL OMNIPAQUE IOHEXOL 350 MG/ML SOLN COMPARISON:  CT abdomen and pelvis 07/13/2017. FINDINGS: Lower chest: There is a trace right pleural effusion. Hepatobiliary: No focal liver abnormality is seen. No gallstones, gallbladder wall thickening, or biliary dilatation. Pancreas: Unremarkable. No pancreatic ductal dilatation or surrounding inflammatory changes. Spleen: Normal in size without focal abnormality. Adrenals/Urinary Tract: Adrenal glands are unremarkable. Kidneys are normal, without renal calculi, focal lesion, or hydronephrosis. Bladder is unremarkable. Stomach/Bowel: Stomach is within normal limits. Appendix appears normal. No evidence of bowel wall thickening, distention, or inflammatory changes. Vascular/Lymphatic: The aorta appears within normal limits. There is occlusive thrombus throughout the entire infrarenal IVC. There is nonocclusive thrombus in the IVC at the level of the renal arteries and within the intrahepatic IVC.  Opacification of the iliac veins is poor, but there are findings suspicious for occlusive thrombus within the right common and external iliac vein and possibly the left common iliac vein. There are prominent bilateral inguinal lymph nodes. Reproductive: Uterus is slightly prominent in size. There is a 2.8 cm cyst in the left ovary. Right adnexa is unremarkable. Other: There is presacral edema as well as diffuse retroperitoneal edema surrounding the IVC. There is no ascites or free intraperitoneal air. Subcutaneous densities in the anterior abdominal wall are likely related to medication injection sites. Musculoskeletal: No  acute or significant osseous findings. IMPRESSION: 1. Occlusive thrombus throughout the infrarenal IVC. Nonocclusive thrombus in the upper abdominal IVC. Findings highly suspicious for bilateral common iliac vein occlusive thrombus and right external iliac vein thrombus. 2. Presacral and retroperitoneal edema likely secondary to venous congestion. Infectious or inflammatory process cannot be excluded. 3. 2.8 cm left ovarian simple-appearing cyst. No follow-up imaging is recommended. Reference: JACR 2020 Feb;17(2):248-254 4. Trace right pleural effusion. 5. These results were called by telephone at the time of interpretation on 03/22/2021 at 10:01 pm to provider Dr. Hyacinth Meeker, who verbally acknowledged these results. Electronically Signed   By: Darliss Cheney M.D.   On: 03/22/2021 22:01   PERIPHERAL VASCULAR CATHETERIZATION  Result Date: 03/24/2021 Table formatting from the original result was not included. Images from the original result were not included. Signed                                                                          DATE OF SERVICE: 03/24/2021  PATIENT:  Andrea Hayes  30 y.o. female  PRE-OPERATIVE DIAGNOSIS:  IVC and bilateral iliac thrombus  POST-OPERATIVE DIAGNOSIS:  Same  PROCEDURE:  1) bilateral vena cava, common iliac, external iliac, common femoral, femoral vein intravascular ultrasound 2) bilateral iliofemoral venogram ( 3) bilateral vena cava, iliac, femoral endovascular thrombectomy (Inari ClotTriever) 4) right common iliac vein angioplasty (14 x 60mm Atlas) 5) conscious sedation (86 minutes).  SURGEON:  Rande Brunt. Lenell Antu, MD  ASSISTANT: none  ANESTHESIA:   local and IV sedation  ESTIMATED BLOOD LOSS:  LOCAL MEDICATIONS USED:  LIDOCAINE  COUNTS: confirmed correct.  PATIENT DISPOSITION:  PACU - hemodynamically stable.  Delay start of Pharmacological VTE agent (>24hrs) due to surgical blood loss or risk of bleeding: no  INDICATION FOR PROCEDURE: Andrea Hayes is a 30 y.o. female with  IVC and bilateral iliac acute on chronic thrombus 4 months postpartum.  Thrombolysis was initiated last night.  The patient had symptomatic improvement today.  After careful discussion of risks, benefits, and alternatives the patient was offered catheter recheck and possible endovascular thrombectomy. The patient  understood and wished to proceed.  OPERATIVE FINDINGS: Chronic thrombus remained in the confluence of the iliac veins and bilateral common iliac veins.  This appeared nearly occlusive on venogram.  Multiple passes of the Inari ClotTriever cleared much of the thrombus out of the bilateral iliac veins and IVC.  Completion venography shows restoration of flow through the pelvis into the IVC.  DESCRIPTION OF PROCEDURE: After identification of the patient in the pre-operative holding area, the patient was transferred to the operating room. The patient was positioned supine on the operating room table.  Anesthesia  was induced. The popliteal fossae were prepped and draped in standard fashion. A surgical pause was performed confirming correct patient, procedure, and operative location.  Amplatz wires were advanced through the thrombolysis catheters, and the catheters were removed.  The bilateral 5 [place] sheaths were upsized to 8 [place].  Amplatz wires were delivered into the right internal jugular vein.  Intravascular ultrasound was performed over the wire evaluating the perihepatic IVC, perirenal IVC, infrarenal IVC, confluence of the iliac veins and bilateral common iliac veins, bilateral external iliac veins, bilateral common femoral veins, and bilateral femoral veins.  This initial ultrasound revealed residual chronic thrombus in the confluence of the iliac veins and bilateral common iliac veins.  Bilateral lower extremity venograms were performed from the sheath.  This revealed patent veins below the inguinal ligaments.  Stagnation of flow was noted in the distal external iliac vein/common femoral vein  bilaterally.  Patient was systemically heparinized. Endovascular thrombectomy was performed bilaterally.  The Inari ClotTriever was used to perform thrombectomy.  6 passes were used in the left lower extremity; 3 passes were performed in the right lower extremity.  Significant clot was returned after the first pass in each limb.  Less clot returned thereafter. I repeated the IVUS and showed there was still some common iliac chronic thrombus bilaterally.  Venogram was performed.  This showed a near occlusive chronic thrombus in the left common iliac vein.  I performed an angioplasty of this with a 14 x 40 Atlas balloon.  Follow-up angiogram showed loose chronic thrombus.  I repeated an Inari clot Cherre Blanc pass with good return.  Follow-up venogram shows resolution of the lesion. The right lower extremity pelvis was imaged with venography.  This showed some chronic thrombus in the common iliac vein.  This is not flow-limiting.  I elected to end the case here.  The Inari sheaths were removed from the popliteal veins and a 3-0 nylon stitch with bolster used to achieve hemostasis bilaterally.  Sterile bandages were applied.  Legs were wrapped with Ace wraps.  Conscious sedation was administered with the use of IV fentanyl and midazolam under continuous physician and nurse monitoring.  Heart rate, blood pressure, and oxygen saturation were continuously monitored.  Total sedation time was 86 minutes  Upon completion of the case instrument and sharps counts were confirmed correct. The patient was transferred to the PACU in good condition. I was present for all portions of the procedure.  PLAN: Resume therapeutic anticoagulation 2 hours postprocedure.  Patient needs a minimum of 6 months of anticoagulation from my standpoint.  Return to floor.  Rande Brunt. Lenell Antu, MD [facility] Phone Number: [phone] 03/24/2021 3:48 PM     PERIPHERAL VASCULAR CATHETERIZATION  Result Date:  03/23/2021 Images from the original result were not included. Patient name: Andrea Hayes MRN: [id number] DOB: Nov 02, 1990 Sex: female 03/23/2021 Pre-operative Diagnosis: IVC, bilateral common iliac, bilateral external iliac and bilateral common femoral vein DVT Post-operative diagnosis:  Same Surgeon:  Luanna Salk. Randie Heinz, MD Procedure Performed: 1.  Ultrasound-guided cannulation bilateral popliteal veins 2.  IVC venogram 3.  Placement of IVC bilateral common iliac vein, bilateral external iliac vein bilateral common femoral vein and bilateral femoral vein 50 cm treatment length UniFuse catheters 4.  Moderate sedation with fentanyl and Versed for 35 minutes Indications: 30 year old female presented with abdominal pain was found to have extensive DVT from her IVC extending down to her bilateral common femoral veins.  Also has bilateral lower extremity swelling.  She is indicated for lytic catheter placement. Findings: Bilateral popliteal veins were distended but they were compressible.  The did have bleeding when the micropuncture sheath was placed.  By central venogram there did appear to be thrombus extending up to the level of the hepatic veins.  There was difficulty placing a wire past the bilateral common iliac veins into the IVC confluence this required catheter direction.  Bilateral UniFuse catheters were placed that were 50 cm treatment length from the distal IVC down to the mid femoral veins.  Procedure:  The patient was identified in the holding area and taken to room 8.  The patient was then placed prone on the table and prepped and draped in the usual sterile fashion.  A time out was called.  Ultrasound was used to evaluate first the left popliteal vein.  This was large but was compressible.  There is no spasm facility again cannulated with micropuncture needle followed by wire and sheath.  We then placed a Glidewire.  A 5 French sheath was placed.  Fluoroscopically we advanced the Glidewire to the level of the  left common iliac vein.  A Berenstein catheter was placed.  Was directed to the IVC.  At the terminal IVC we performed bleeding venogram which demonstrated thrombus to the level of the hepatic veins.  We placed UniFuse catheter as well as the occluder wire.  We turned our attention to the right leg.  Ultrasound used to identify the popliteal vein on that side.  This also was enlarged although somewhat compressible.  The area again was anesthetized 1% lidocaine cannulated micropuncture needle followed by wire and sheath.  Glidewire was placed followed by 5 [place] sheath.  Again the Glidewire did hang up at the common iliac vein on the right.  We used Berenstein catheter directed the IVC and again perform IVC venogram at the terminal IVC to confirm intraluminal access.  The UniFuse catheter was placed with the occluded wire.  Single shot images were taken centrally and peripherally to demonstrate the placement of the catheter.  Both catheters were flushed.  They will be helped tPA.  She tolerated procedure without any complication. Contrast: 20 cc Brandon C. Randie Heinz, MD [facility] Office: [phone] Pager: [phone]    Subjective: Patient was seen and examined today.  No new complaints.  No chest pain or shortness of breath.  She was saturating well on room air and able to ambulate without desaturating.  Discharge Exam: Vitals:   03/27/21 1000 03/27/21 1111  BP: 112/76   Pulse: (!) 103   Resp: (!) 22   Temp:  98.5 F (36.9 C)  SpO2: 96%    Vitals:   03/27/21 0800 03/27/21 0900 03/27/21 1000 03/27/21 1111  BP: (!) 133/99 97/78 112/76   Pulse: (!) 56 (!) 108 (!) 103   Resp: (!) 22 (!) 23 (!) 22   Temp:    98.5 F (36.9 C)  TempSrc:    Oral  SpO2: 98% 95% 96%   Weight:      Height:        General: Pt is alert, awake, not in acute distress Cardiovascular: Mild sinus tachycardia. Respiratory: CTA bilaterally, no wheezing, no rhonchi Abdominal: Soft, NT, ND,  bowel sounds + Extremities: no edema, no cyanosis   The results of significant diagnostics from this hospitalization (including imaging, microbiology, ancillary and laboratory) are listed below for reference.    Microbiology: Recent Results (from the past 240 hour(s))  Resp Panel by  RT-PCR (Flu A&B, Covid) Nasopharyngeal Swab     Status: None   Collection Time: 03/22/21 10:08 PM   Specimen: Nasopharyngeal Swab; Nasopharyngeal(NP) swabs in vial transport medium  Result Value Ref Range Status   SARS Coronavirus 2 by RT PCR NEGATIVE NEGATIVE Final    Comment: (NOTE) SARS-CoV-2 target nucleic acids are NOT DETECTED.  The SARS-CoV-2 RNA is generally detectable in upper respiratory specimens during the acute phase of infection. The lowest concentration of SARS-CoV-2 viral copies this assay can detect is 138 copies/mL. A negative result does not preclude SARS-Cov-2 infection and should not be used as the sole basis for treatment or other patient management decisions. A negative result may occur with  improper specimen collection/handling, submission of specimen other than nasopharyngeal swab, presence of viral mutation(s) within the areas targeted by this assay, and inadequate number of viral copies(<138 copies/mL). A negative result must be combined with clinical observations, patient history, and epidemiological information. The expected result is Negative.  Fact Sheet for Patients:  BloggerCourse.com  Fact Sheet for Healthcare Providers:  SeriousBroker.it  This test is no t yet approved or cleared by the [place] FDA and  has been authorized for detection and/or diagnosis of SARS-CoV-2 by FDA under an Emergency Use Authorization (EUA). This EUA will remain  in effect (meaning this test can be used) for the duration of the COVID-19 declaration under Section 564(b)(1) of the Act, 21 U.S.C.section 360bbb-3(b)(1), unless the  authorization is terminated  or revoked sooner.       Influenza A by PCR NEGATIVE NEGATIVE Final   Influenza B by PCR NEGATIVE NEGATIVE Final    Comment: (NOTE) The Xpert Xpress SARS-CoV-2/FLU/RSV plus assay is intended as an aid in the diagnosis of influenza from Nasopharyngeal swab specimens and should not be used as a sole basis for treatment. Nasal washings and aspirates are unacceptable for Xpert Xpress SARS-CoV-2/FLU/RSV testing.  Fact Sheet for Patients: BloggerCourse.com  Fact Sheet for Healthcare Providers: SeriousBroker.it  This test is not yet approved or cleared by the [place] FDA and has been authorized for detection and/or diagnosis of SARS-CoV-2 by FDA under an Emergency Use Authorization (EUA). This EUA will remain in effect (meaning this test can be used) for the duration of the COVID-19 declaration under Section 564(b)(1) of the Act, 21 U.S.C. section 360bbb-3(b)(1), unless the authorization is terminated or revoked.  Performed at [facility], [address] [place], [place] [address]      Labs: BNP (last 3 results) Recent Labs    11/28/20 0655  BNP 41.3   Basic Metabolic Panel: Recent Labs  Lab 03/24/21 0021 03/24/21 1933 03/25/21 0034 03/26/21 0409 03/27/21 0124  NA 134* 136 136 137 135  K 3.9 4.3 3.9 4.0 4.0  CL 101 103 103 101 99  CO2 23 23 26 25 25   GLUCOSE 100* 72 101* 87 100*  BUN <5* 5* <5* <5* <5*  CREATININE 0.59 0.59 0.51 0.53 0.57  CALCIUM 8.9 9.0 8.4* 9.0 8.9   Liver Function Tests: Recent Labs  Lab 03/23/21 0530 03/24/21 0021 03/25/21 0034 03/26/21 0409 03/27/21 0124  AST 63* 33 19 42* 45*  ALT 105* 73* 37 45* 39  ALKPHOS 248* 237* 167* 193* 175*  BILITOT 0.6 0.5 0.7 0.3 0.7  PROT 7.5 7.6 6.1* 7.0 6.6  ALBUMIN 2.5* 2.5* 1.9* 2.3* 2.2*   Recent Labs  Lab 03/22/21 0820  LIPASE 29   No results for input(s): AMMONIA in the last 168  hours.  CBC: Recent Labs  Lab 03/23/21 0530 03/23/21 1557 03/24/21 0622 03/24/21 2245 03/25/21 0034 03/26/21 0409 03/27/21 0721  WBC 12.6*   < > 12.5* 13.6* 11.9* 8.5 7.4  NEUTROABS 9.9*  --   --  10.0*  --  5.8  --   HGB 10.9*   < > 11.5* 10.9* 10.2* 11.1* 10.4*  HCT 35.0*   < > 36.9 36.0 31.9* 35.3* 33.5*  MCV 87.1   < > 86.6 86.7 86.0 86.1 85.0  PLT 217   < > 164 111* 108* 101* 97*   < > = values in this interval not displayed.   Cardiac Enzymes: No results for input(s): CKTOTAL, CKMB, CKMBINDEX, TROPONINI in the last 168 hours. BNP: Invalid input(s): POCBNP CBG: Recent Labs  Lab 03/26/21 1105 03/26/21 1610 03/26/21 1948 03/27/21 0629 03/27/21 1110  GLUCAP 104* 92 88 75 91   D-Dimer No results for input(s): DDIMER in the last 72 hours. Hgb A1c No results for input(s): HGBA1C in the last 72 hours. Lipid Profile No results for input(s): CHOL, HDL, LDLCALC, TRIG, CHOLHDL, LDLDIRECT in the last 72 hours. Thyroid function studies Recent Labs    03/25/21 0844  TSH 4.265   Anemia work up No results for input(s): VITAMINB12, FOLATE, FERRITIN, TIBC, IRON, RETICCTPCT in the last 72 hours. Urinalysis    Component Value Date/Time   COLORURINE YELLOW 03/22/2021 0814   APPEARANCEUR CLEAR 03/22/2021 0814   LABSPEC >1.030 (H) 03/22/2021 0814   PHURINE 6.0 03/22/2021 0814   GLUCOSEU NEGATIVE 03/22/2021 0814   HGBUR TRACE (A) 03/22/2021 0814   BILIRUBINUR NEGATIVE 03/22/2021 0814   BILIRUBINUR negaive 10/17/2020 1617   KETONESUR NEGATIVE 03/22/2021 0814   PROTEINUR >300 (A) 03/22/2021 0814   UROBILINOGEN 0.2 10/17/2020 1617   UROBILINOGEN 0.2 10/23/2012 1135   NITRITE NEGATIVE 03/22/2021 0814   LEUKOCYTESUR NEGATIVE 03/22/2021 0814   Sepsis Labs Invalid input(s): PROCALCITONIN,  WBC,  LACTICIDVEN Microbiology Recent Results (from the past 240 hour(s))  Resp Panel by RT-PCR (Flu A&B, Covid) Nasopharyngeal Swab     Status: None   Collection Time: 03/22/21 10:08 PM    Specimen: Nasopharyngeal Swab; Nasopharyngeal(NP) swabs in vial transport medium  Result Value Ref Range Status   SARS Coronavirus 2 by RT PCR NEGATIVE NEGATIVE Final    Comment: (NOTE) SARS-CoV-2 target nucleic acids are NOT DETECTED.  The SARS-CoV-2 RNA is generally detectable in upper respiratory specimens during the acute phase of infection. The lowest concentration of SARS-CoV-2 viral copies this assay can detect is 138 copies/mL. A negative result does not preclude SARS-Cov-2 infection and should not be used as the sole basis for treatment or other patient management decisions. A negative result may occur with  improper specimen collection/handling, submission of specimen other than nasopharyngeal swab, presence of viral mutation(s) within the areas targeted by this assay, and inadequate number of viral copies(<138 copies/mL). A negative result must be combined with clinical observations, patient history, and epidemiological information. The expected result is Negative.  Fact Sheet for Patients:  BloggerCourse.com  Fact Sheet for Healthcare Providers:  SeriousBroker.it  This test is no t yet approved or cleared by the [place] FDA and  has been authorized for detection and/or diagnosis of SARS-CoV-2 by FDA under an Emergency Use Authorization (EUA). This EUA will remain  in effect (meaning this test can be used) for the duration of the COVID-19 declaration under Section 564(b)(1) of the Act, 21 U.S.C.section 360bbb-3(b)(1), unless the authorization is terminated  or revoked sooner.  Influenza A by PCR NEGATIVE NEGATIVE Final   Influenza B by PCR NEGATIVE NEGATIVE Final    Comment: (NOTE) The Xpert Xpress SARS-CoV-2/FLU/RSV plus assay is intended as an aid in the diagnosis of influenza from Nasopharyngeal swab specimens and should not be used as a sole basis for treatment. Nasal washings and aspirates are  unacceptable for Xpert Xpress SARS-CoV-2/FLU/RSV testing.  Fact Sheet for Patients: BloggerCourse.com  Fact Sheet for Healthcare Providers: SeriousBroker.it  This test is not yet approved or cleared by the [place] FDA and has been authorized for detection and/or diagnosis of SARS-CoV-2 by FDA under an Emergency Use Authorization (EUA). This EUA will remain in effect (meaning this test can be used) for the duration of the COVID-19 declaration under Section 564(b)(1) of the Act, 21 U.S.C. section 360bbb-3(b)(1), unless the authorization is terminated or revoked.  Performed at [facility], [address] [place], [place] [address]     Time coordinating discharge: Over 30 minutes  SIGNED:  Arnetha Courser, MD  [facility] 03/27/2021, 12:18 PM  If 7PM-7AM, please contact night-coverage [email or web address]  This record has been created using Conservation officer, historic buildings. Errors have been sought and corrected,but may not always be located. Such creation errors do not reflect on the standard of care.

## 2021-03-27 NOTE — TOC Benefit Eligibility Note (Signed)
Patient Product/process development scientist completed.    The patient is currently admitted and upon discharge could be taking Xarelto 20 mg.  The current 30 day co-pay is, $0.00.   The patient is insured through [facility]     Roland Earl, CPhT Pharmacy Patient Advocate Specialist [facility] Antimicrobial Stewardship Team Direct Number: [phone]  Fax: [phone]

## 2021-03-27 NOTE — TOC Benefit Eligibility Note (Signed)
Transition of Care [facility]) Benefit Eligibility Note    Patient Details  Name: Andrea Hayes MRN: [id number] Date of Birth: 06/22/1991   Medication/Dose: Xarelto 20mg . daily for 30 day supply [facility], @ [facility] 90 day supply daily  Covered?: Yes  Tier: 3 Drug  Prescription Coverage [facility]erred [facility]  Spoke with Person/Company/Phone Number:: [phone]. W/[employer] PH# [phone]  Co-Pay: Zero Co-Pay  Prior Approval: No  Deductible:  (no deductible)       [phone] Phone Number: 03/27/2021, 9:45 AM

## 2021-03-27 NOTE — Consult Note (Signed)
   [facility] CM Inpatient Consult   03/27/2021  Nathalie Bogert [birth year] [id number]  [facility] [[facility]]  Accountable Care Organization [ACO] Patient  [employer]  Primary Care Provider:  Fleet Contras, MD   Patient screened for post hospitalization needs for potential [facility]  [[facility]] Care Management service needs for post hospital transition and support follow up.   Plan:  Will refer patient for post hospital RN Complex disease management.  For questions contact:   Charlesetta Shanks, RN BSN CCM [facility]  [phone] business mobile phone Toll free office [phone]  Fax number: [phone] [email or web address]

## 2021-03-31 ENCOUNTER — Other Ambulatory Visit: Payer: Self-pay | Admitting: *Deleted

## 2021-03-31 ENCOUNTER — Inpatient Hospital Stay (HOSPITAL_COMMUNITY)
Admission: EM | Admit: 2021-03-31 | Discharge: 2021-04-08 | DRG: 300 | Disposition: A | Discharge status: Home / Self Care | Payer: 59 | Attending: Internal Medicine | Admitting: Internal Medicine

## 2021-03-31 ENCOUNTER — Emergency Department (HOSPITAL_COMMUNITY): Payer: 59

## 2021-03-31 ENCOUNTER — Encounter (HOSPITAL_COMMUNITY): Payer: Self-pay

## 2021-03-31 ENCOUNTER — Other Ambulatory Visit: Payer: Self-pay

## 2021-03-31 DIAGNOSIS — E871 Hypo-osmolality and hyponatremia: Secondary | ICD-10-CM | POA: Diagnosis present

## 2021-03-31 DIAGNOSIS — R109 Unspecified abdominal pain: Secondary | ICD-10-CM | POA: Diagnosis present

## 2021-03-31 DIAGNOSIS — Z20822 Contact with and (suspected) exposure to covid-19: Secondary | ICD-10-CM | POA: Diagnosis present

## 2021-03-31 DIAGNOSIS — M79605 Pain in left leg: Secondary | ICD-10-CM | POA: Diagnosis present

## 2021-03-31 DIAGNOSIS — I10 Essential (primary) hypertension: Secondary | ICD-10-CM | POA: Diagnosis present

## 2021-03-31 DIAGNOSIS — K921 Melena: Secondary | ICD-10-CM | POA: Diagnosis present

## 2021-03-31 DIAGNOSIS — I82442 Acute embolism and thrombosis of left tibial vein: Secondary | ICD-10-CM | POA: Diagnosis present

## 2021-03-31 DIAGNOSIS — R195 Other fecal abnormalities: Secondary | ICD-10-CM | POA: Diagnosis present

## 2021-03-31 DIAGNOSIS — D649 Anemia, unspecified: Secondary | ICD-10-CM | POA: Diagnosis present

## 2021-03-31 DIAGNOSIS — Z7901 Long term (current) use of anticoagulants: Secondary | ICD-10-CM

## 2021-03-31 DIAGNOSIS — G932 Benign intracranial hypertension: Secondary | ICD-10-CM | POA: Diagnosis present

## 2021-03-31 DIAGNOSIS — Z86711 Personal history of pulmonary embolism: Secondary | ICD-10-CM

## 2021-03-31 DIAGNOSIS — K648 Other hemorrhoids: Secondary | ICD-10-CM | POA: Diagnosis present

## 2021-03-31 DIAGNOSIS — I2699 Other pulmonary embolism without acute cor pulmonale: Secondary | ICD-10-CM

## 2021-03-31 DIAGNOSIS — Z955 Presence of coronary angioplasty implant and graft: Secondary | ICD-10-CM

## 2021-03-31 DIAGNOSIS — Z91128 Patient's intentional underdosing of medication regimen for other reason: Secondary | ICD-10-CM

## 2021-03-31 DIAGNOSIS — Z8673 Personal history of transient ischemic attack (TIA), and cerebral infarction without residual deficits: Secondary | ICD-10-CM

## 2021-03-31 DIAGNOSIS — I82220 Acute embolism and thrombosis of inferior vena cava: Secondary | ICD-10-CM

## 2021-03-31 DIAGNOSIS — R1084 Generalized abdominal pain: Secondary | ICD-10-CM

## 2021-03-31 DIAGNOSIS — I82413 Acute embolism and thrombosis of femoral vein, bilateral: Principal | ICD-10-CM | POA: Diagnosis present

## 2021-03-31 DIAGNOSIS — Z79899 Other long term (current) drug therapy: Secondary | ICD-10-CM

## 2021-03-31 DIAGNOSIS — D509 Iron deficiency anemia, unspecified: Secondary | ICD-10-CM | POA: Diagnosis present

## 2021-03-31 DIAGNOSIS — Z86718 Personal history of other venous thrombosis and embolism: Secondary | ICD-10-CM

## 2021-03-31 LAB — CBC WITH DIFFERENTIAL/PLATELET
Abs Immature Granulocytes: 0.31 10*3/uL — ABNORMAL HIGH (ref 0.00–0.07)
Basophils Absolute: 0.1 10*3/uL (ref 0.0–0.1)
Basophils Relative: 1 %
Eosinophils Absolute: 0.1 10*3/uL (ref 0.0–0.5)
Eosinophils Relative: 0 %
HCT: 35.3 % — ABNORMAL LOW (ref 36.0–46.0)
Hemoglobin: 11 g/dL — ABNORMAL LOW (ref 12.0–15.0)
Immature Granulocytes: 2 %
Lymphocytes Relative: 14 %
Lymphs Abs: 2 10*3/uL (ref 0.7–4.0)
MCH: 26.6 pg (ref 26.0–34.0)
MCHC: 31.2 g/dL (ref 30.0–36.0)
MCV: 85.3 fL (ref 80.0–100.0)
Monocytes Absolute: 1.3 10*3/uL — ABNORMAL HIGH (ref 0.1–1.0)
Monocytes Relative: 9 %
Neutro Abs: 10.4 10*3/uL — ABNORMAL HIGH (ref 1.7–7.7)
Neutrophils Relative %: 74 %
Platelets: 304 10*3/uL (ref 150–400)
RBC: 4.14 MIL/uL (ref 3.87–5.11)
RDW: 14 % (ref 11.5–15.5)
WBC: 14.1 10*3/uL — ABNORMAL HIGH (ref 4.0–10.5)
nRBC: 0 % (ref 0.0–0.2)

## 2021-03-31 LAB — COMPREHENSIVE METABOLIC PANEL
ALT: 80 U/L — ABNORMAL HIGH (ref 0–44)
AST: 141 U/L — ABNORMAL HIGH (ref 15–41)
Albumin: 2.7 g/dL — ABNORMAL LOW (ref 3.5–5.0)
Alkaline Phosphatase: 263 U/L — ABNORMAL HIGH (ref 38–126)
Anion gap: 12 (ref 5–15)
BUN: 9 mg/dL (ref 6–20)
CO2: 18 mmol/L — ABNORMAL LOW (ref 22–32)
Calcium: 9.5 mg/dL (ref 8.9–10.3)
Chloride: 101 mmol/L (ref 98–111)
Creatinine, Ser: 0.65 mg/dL (ref 0.44–1.00)
GFR, Estimated: >60 mL/min (ref >60)
Glucose, Bld: 105 mg/dL — ABNORMAL HIGH (ref 70–99)
Potassium: 3.9 mmol/L (ref 3.5–5.1)
Sodium: 131 mmol/L — ABNORMAL LOW (ref 135–145)
Total Bilirubin: 0.3 mg/dL (ref 0.3–1.2)
Total Protein: 8.3 g/dL — ABNORMAL HIGH (ref 6.5–8.1)

## 2021-03-31 LAB — LIPASE, BLOOD: Lipase: 34 U/L (ref 11–51)

## 2021-03-31 LAB — POC OCCULT BLOOD, ED: Fecal Occult Bld: POSITIVE — AB

## 2021-03-31 MED ORDER — MORPHINE SULFATE (PF) 4 MG/ML IV SOLN
4.0000 mg | Freq: Once | INTRAVENOUS | Status: AC
  Administered 2021-04-01: 4 mg via INTRAVENOUS
  Filled 2021-03-31: qty 1

## 2021-03-31 MED ORDER — IOHEXOL 350 MG/ML SOLN
75.0000 mL | Freq: Once | INTRAVENOUS | Status: AC | PRN
  Administered 2021-03-31: 75 mL via INTRAVENOUS

## 2021-03-31 MED ORDER — MORPHINE SULFATE (PF) 4 MG/ML IV SOLN
4.0000 mg | Freq: Once | INTRAVENOUS | Status: AC
Start: 2021-03-31 — End: 2021-03-31
  Administered 2021-03-31: 4 mg via INTRAVENOUS
  Filled 2021-03-31: qty 1

## 2021-03-31 NOTE — ED Triage Notes (Signed)
Patient recently hospitalized with BIL LE DVT, pt reports BIL thrombectomy. On xarelto L upper leg pain since today

## 2021-03-31 NOTE — ED Triage Notes (Signed)
Patient also reporting dark, tarry stool.

## 2021-03-31 NOTE — Patient Outreach (Signed)
[facility]) Care Management  03/31/2021  Andrea Hayes 1991-02-13 [id number]  [facility] outreach to post hospital referred patient  [name] was referred to [facility] on 03/27/21   Insurance [employer]   Outreach to [phone] [name] is able to understand English  She denies the need of a [place] interpreter Her 32 month old baby Daughter and mother are with her  Her baby is crying to be fed She is able to verify her name Was only able to briefly speak with frequent interruptions as the baby cried at intervals  Assessment  When RN CM asked how she was doing since her discharge home she informed RN CM she was having pain of the top of her left leg. She reports not being able to lift her left leg without shooting pain She reports the left leg is hot She states she was able to get all her medications except her pain medication and has been only using Tylenol RN CM reviewed signs and symptoms (s/s) of deep vein thrombosis (dvt) and melena related to blood thinner use. Discussed the risks if not evaluated.   Discussed medical care disposition options She voiced understanding of need to seek medical care, risks and symptoms    Went to pcp office post hospital visit  She lives with her 4 month daughter and her mother  No longer breast feeding and the baby is liking and tolerating the formula introduced to her prior to discharge She confirms she completed this transition to formula so she could take her medications ordered    Plan She did agree to seek medical services today or tomorrow if possible as encouraged and for RN CM to follow up with her within 1-3 business days   Edvin Albus L. Noelle Penner, RN, BSN, CCM [facility] Telephonic Care Management Care Coordinator Office number [phone] [facility] number [phone] Fax number [phone]

## 2021-03-31 NOTE — ED Provider Notes (Signed)
[facility] EMERGENCY DEPARTMENT Provider Note   CSN: [id number] Arrival date & time: 03/31/21  1850     History Chief Complaint  Patient presents with   Leg Pain   Melena    Andrea Hayes is a 30 y.o. female.  HPI This is a 30 year old female with extensive past medical history including extensive thrombotic disease was recently hospitalized and discharged 4 days ago after thrombectomy, lytics, and heparin infusion for occlusive PE, clots in the IVC, and bilateral lower extremities.  Patient was discharged on Xarelto.  Patient reports compliance with her medicines.  3 days ago 1 time she developed left lower extremity pain worse in the left femur.  Described as sharp, nonradiating, 10 out of 10 in severity, worse with movement.  Not associated with any swelling, rash, color change.  Does have decreased range of motion and inability to bear weight secondary to pain.  Also reports new dark stools but denies any bright red blood per rectum or hematemesis.     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   Acute respiratory failure with hypoxia (HCC) 01/26/2021   Chronic right arterial ischemic stroke, MCA (middle cerebral artery) 01/23/2021   Pulmonary embolism (HCC) 01/23/2021   Leukocytosis 01/23/2021   Thrombocytopenia (HCC) 01/23/2021   Elevated liver enzymes 01/23/2021   Chest pain 01/23/2021   Back pain 01/23/2021   Vaginal delivery 12/05/2020   Headache 11/28/2020   HTN (hypertension) 11/28/2020   Severe preeclampsia, delivered 11/04/2020   IUGR (intrauterine growth restriction) affecting care of mother 10/31/2020   Thrombocytopenia affecting pregnancy (HCC) 05/27/2020   Acute right arterial ischemic stroke, middle cerebral artery (MCA) (HCC)    Dural venous sinus thrombosis     Idiopathic intracranial hypertension    Cerebral venous thrombosis 07/24/2018    Past Surgical History:  Procedure Laterality Date   NO PAST SURGERIES     PERIPHERAL VASCULAR BALLOON ANGIOPLASTY  03/24/2021   Procedure: PERIPHERAL VASCULAR BALLOON ANGIOPLASTY;  Surgeon: Leonie Douglas, MD;  Location: [facility] INVASIVE CV LAB;  Service: Cardiovascular;;  bilateral common iliacs   PERIPHERAL VASCULAR THROMBECTOMY N/A 03/23/2021   Procedure: PERIPHERAL VASCULAR THROMBECTOMY;  Surgeon: Maeola Harman, MD;  Location: [facility] INVASIVE CV LAB;  Service: Cardiovascular;  Laterality: N/A;   PERIPHERAL VASCULAR THROMBECTOMY N/A 03/24/2021   Procedure: LYSIS RECHECK;  Surgeon: Leonie Douglas, MD;  Location: [facility] INVASIVE CV LAB;  Service: Cardiovascular;  Laterality: N/A;     OB History     Gravida  1   Para  1   Term  1   Preterm      AB      Living  1      SAB      IAB      Ectopic      Multiple  0   Live Births  1           Family History  Problem Relation Age of Onset   Asthma Father    Obesity Neg Hx    Hypertension Neg Hx    Diabetes Neg Hx    Cancer Neg Hx     Social History   Tobacco Use   Smoking status: Never   Smokeless tobacco: Never  Vaping Use   Vaping Use: Never used  Substance Use Topics   Alcohol use: No    Alcohol/week: 0.0 standard drinks   Drug use: No    Home Medications Prior to Admission medications   Medication Sig Start Date End Date Taking? Authorizing Provider  acetaZOLAMIDE (DIAMOX) 125 MG tablet Take 2 tablets (250 mg total) by mouth 2 (two) times daily. 12/02/20  Yes Everlena Cooper, Adam R, DO  metoprolol succinate (TOPROL-XL) 25 MG 24 hr tablet Take 1 tablet (25 mg total) by mouth daily. 03/27/21  Yes Arnetha Courser, MD  RIVAROXABAN Carlena Hurl) VTE STARTER PACK (15 & 20 MG) Follow package directions: Take one 15mg  tablet by mouth twice a day. On day 22, switch to one 20mg  tablet once a day. Take with food. 03/27/21  Yes , MD   Vitamin D, Ergocalciferol, (DRISDOL) 1.25 MG (50000 UNIT) CAPS capsule Take 50,000 Units by mouth every Wednesday. 02/24/21  Yes [provider]  acetaminophen (TYLENOL) 325 MG tablet Take 2 tablets (650 mg total) by mouth every 6 (six) hours as needed for mild pain or headache (do not take over 3000 mg in a day of tylenol. please note there is tylenol in the fiorecet). Patient not taking: No sig reported 07/29/18   02/26/21, NP  ferrous fumarate-b12-vitamic C-folic acid (TRINSICON / FOLTRIN) capsule Take 1 capsule by mouth 2 (two) times daily after a meal. Patient not taking: No sig reported 03/27/21   Layne Benton, MD  HYDROcodone-acetaminophen (NORCO/VICODIN) 5-325 MG tablet Take 1-2 tablets by mouth every 4 (four) hours as needed for moderate pain or severe pain. Patient not taking: No sig reported 03/27/21   Arnetha Courser, MD  Prenatal Vit-Fe Fumarate-FA (PRENATAL VITAMIN) 27-0.8 MG TABS Take 1 tablet by mouth daily. Patient not taking: Reported on 03/31/2021 04/19/20   04/02/2021, CNM  rivaroxaban (XARELTO) 20 MG TABS tablet Take 1 tablet (20 mg total) by mouth daily with supper. 04/15/21   Aviva Signs, MD    Allergies    Patient has no known allergies.  Review of Systems   Review of Systems  Constitutional:  Negative for chills and fever.  HENT:  Negative for ear pain and sore throat.   Eyes:  Negative for pain and visual disturbance.  Respiratory:  Negative for cough and shortness of breath.   Cardiovascular:  Negative for chest pain and palpitations.  Gastrointestinal:  Positive for abdominal pain and blood in stool. Negative for vomiting.  Genitourinary:  Negative for dysuria and hematuria.  Musculoskeletal:  Positive for gait problem, joint swelling and myalgias. Negative for arthralgias and back pain.  Skin:  Negative for color change and rash.  Neurological:  Negative for seizures and syncope.  All other systems reviewed and are negative.  Physical  Exam Updated Vital Signs BP 106/70   Pulse (!) 105   Temp 99.2 F (37.3 C) (Oral)   Resp (!) 22   Ht 5\' 5"  (1.651 m)   Wt 76.4 kg   SpO2 97%   BMI 28.03 kg/m   Physical Exam Vitals and nursing note reviewed.  Constitutional:      General: She is not in acute distress.    Appearance: She is well-developed.  HENT:     Head: Normocephalic and atraumatic.  Eyes:     Conjunctiva/sclera: Conjunctivae normal.  Cardiovascular:     Rate and Rhythm: Normal rate and regular rhythm.     Heart sounds: No murmur heard. Pulmonary:     Effort: Pulmonary effort is normal. No respiratory distress.  Breath sounds: Normal breath sounds.  Abdominal:     Palpations: Abdomen is soft.     Tenderness: There is abdominal tenderness. There is no guarding or rebound.  Musculoskeletal:     Cervical back: Neck supple.  Skin:    General: Skin is warm and dry.  Neurological:     Mental Status: She is alert.    ED Results / Procedures / Treatments   Labs (all labs ordered are listed, but only abnormal results are displayed) Labs Reviewed  CBC WITH DIFFERENTIAL/PLATELET - Abnormal; Notable for the following components:      Result Value   WBC 14.1 (*)    Hemoglobin 11.0 (*)    HCT 35.3 (*)    Neutro Abs 10.4 (*)    Monocytes Absolute 1.3 (*)    Abs Immature Granulocytes 0.31 (*)    All other components within normal limits  COMPREHENSIVE METABOLIC PANEL - Abnormal; Notable for the following components:   Sodium 131 (*)    CO2 18 (*)    Glucose, Bld 105 (*)    Total Protein 8.3 (*)    Albumin 2.7 (*)    AST 141 (*)    ALT 80 (*)    Alkaline Phosphatase 263 (*)    All other components within normal limits  URINALYSIS, ROUTINE W REFLEX MICROSCOPIC - Abnormal; Notable for the following components:   Specific Gravity, Urine 1.039 (*)    Protein, ur 100 (*)    All other components within normal limits  POC OCCULT BLOOD, ED - Abnormal; Notable for the following components:   Fecal Occult  Bld POSITIVE (*)    All other components within normal limits  LIPASE, BLOOD  APTT    EKG None  Radiology CT Angio Abd/Pel W and/or Wo Contrast  Result Date: 03/31/2021 CLINICAL DATA:  Acute mesenteric ischemia, history of IVC thrombosis and DVT EXAM: CTA ABDOMEN AND PELVIS WITHOUT AND WITH CONTRAST TECHNIQUE: Multidetector CT imaging of the abdomen and pelvis was performed using the standard protocol during bolus administration of intravenous contrast. Multiplanar reconstructed images and MIPs were obtained and reviewed to evaluate the vascular anatomy. CONTRAST:  38mL OMNIPAQUE IOHEXOL 350 MG/ML SOLN COMPARISON:  03/22/2021 FINDINGS: VASCULAR Aorta: Normal caliber aorta without aneurysm, dissection, vasculitis or significant stenosis. Celiac: Patent without evidence of aneurysm, dissection, vasculitis or significant stenosis. SMA: Patent without evidence of aneurysm, dissection, vasculitis or significant stenosis. Renals: Both renal arteries are patent without evidence of aneurysm, dissection, vasculitis, fibromuscular dysplasia or significant stenosis. IMA: Patent without evidence of aneurysm, dissection, vasculitis or significant stenosis. Inflow: Patent without evidence of aneurysm, dissection, vasculitis or significant stenosis. Proximal Outflow: Bilateral common femoral and visualized portions of the superficial and profunda femoral arteries are patent without evidence of aneurysm, dissection, vasculitis or significant stenosis. 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 There is continued acute DVT involving the right common iliac, right external iliac, and bilateral internal iliac veins. Overall decreased clot burden since prior study. Review of the MIP images confirms the above findings. NON-VASCULAR Lower chest: No acute pleural or parenchymal lung disease. Hepatobiliary: No focal liver abnormality is seen. No gallstones, gallbladder wall thickening, or biliary  dilatation. Pancreas: Unremarkable. No pancreatic ductal dilatation or surrounding inflammatory changes. Spleen: Normal in size without focal abnormality. Adrenals/Urinary Tract: Adrenal glands are unremarkable. Kidneys are normal, without renal calculi, focal lesion, or hydronephrosis. Bladder is unremarkable. Stomach/Bowel: No bowel obstruction or ileus. Normal appendix right lower quadrant. No bowel wall  thickening or inflammatory change. No evidence of mesenteric ischemia. Lymphatic: Stable borderline enlarged bilateral iliac chain lymph nodes may be reactive. No other pathologic adenopathy. Reproductive: 3.2 cm simple appearing left ovarian cyst. Right ovary and uterus are unremarkable. Other: Decreased presacral edema consistent with improving DVT. No free intraperitoneal fluid or free gas. Musculoskeletal: There are no acute or destructive bony lesions. 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 Reconstructed images demonstrate no additional findings. IMPRESSION: VASCULAR 1. No evidence of mesenteric ischemia. Arterial structures are widely patent. 2. Persistent but improving deep venous thrombosis involving the right common iliac, right external iliac, and bilateral internal iliac veins. The IVC thrombus seen previously has resolved in the interim. NON-VASCULAR 1. No bowel obstruction or ileus. No evidence of mesenteric ischemia. 2. 3.2 cm simple left ovarian cyst. No follow-up imaging is recommended. 3. Likely reactive borderline enlarged lymph nodes along the bilateral iliac and inguinal distributions. Electronically Signed   By: Sharlet Salina M.D.   On: 03/31/2021 23:05    Procedures Procedures   Medications Ordered in ED Medications  morphine 4 MG/ML injection 4 mg (4 mg Intravenous Given 03/31/21 2030)  iohexol (OMNIPAQUE) 350 MG/ML injection 75 mL (75 mLs Intravenous Contrast Given 03/31/21 2253)  morphine 4 MG/ML injection 4 mg (4 mg Intravenous Given  04/01/21 0028)    ED Course  I have reviewed the triage vital signs and the nursing notes.  Pertinent labs & imaging results that were available during my care of the patient were reviewed by me and considered in my medical decision making (see chart for details).    MDM Rules/Calculators/A&P                          Patient stable is well appearing on exam. Abdomen is soft but with general tenderness on exam. C/f ischemic pathology given history of thrombotic disease. Other items on the ddx include cholecystitis, pancreatitis, appendicitis, and gastritis. Hemocult positive rectal exam concerning for GI bleed 2/2 xarelto. Hgb is stable from prior at 11 and patient is stable. No evidence of brisk upper GI bleed or hx of significant liver disease. Will continue to monitor.   Impressive tenderness to LLE concerning for worsening clot burden given history of extensive thrombotic disease. No skin changes or evidence of cellulitis. No hx of traumatic injury. 1+ DP pulse so low suspicion for arterial injury. Will obtain labs including coags. Unable to obtain LE ultrasound tonight to assess clot burden. Admit for pain control, vascular consult, and ultrasound to assess clot burden. Also for new melena on blood thinner.   Final Clinical Impression(s) / ED Diagnoses Final diagnoses:  None    Rx / DC Orders ED Discharge Orders     None        Doran Clay, MD 04/01/21 0211    Rozelle Logan, DO 04/04/21 1148

## 2021-04-01 ENCOUNTER — Encounter (HOSPITAL_COMMUNITY): Payer: Self-pay | Admitting: Internal Medicine

## 2021-04-01 ENCOUNTER — Observation Stay (HOSPITAL_BASED_OUTPATIENT_CLINIC_OR_DEPARTMENT_OTHER): Payer: 59

## 2021-04-01 DIAGNOSIS — I82429 Acute embolism and thrombosis of unspecified iliac vein: Secondary | ICD-10-CM

## 2021-04-01 DIAGNOSIS — D509 Iron deficiency anemia, unspecified: Secondary | ICD-10-CM | POA: Diagnosis not present

## 2021-04-01 DIAGNOSIS — I82442 Acute embolism and thrombosis of left tibial vein: Secondary | ICD-10-CM

## 2021-04-01 DIAGNOSIS — R195 Other fecal abnormalities: Secondary | ICD-10-CM | POA: Diagnosis not present

## 2021-04-01 DIAGNOSIS — M79605 Pain in left leg: Secondary | ICD-10-CM | POA: Diagnosis present

## 2021-04-01 DIAGNOSIS — M7989 Other specified soft tissue disorders: Secondary | ICD-10-CM | POA: Diagnosis not present

## 2021-04-01 DIAGNOSIS — Z7901 Long term (current) use of anticoagulants: Secondary | ICD-10-CM

## 2021-04-01 LAB — COMPREHENSIVE METABOLIC PANEL
ALT: 62 U/L — ABNORMAL HIGH (ref 0–44)
AST: 69 U/L — ABNORMAL HIGH (ref 15–41)
Albumin: 2.5 g/dL — ABNORMAL LOW (ref 3.5–5.0)
Alkaline Phosphatase: 258 U/L — ABNORMAL HIGH (ref 38–126)
Anion gap: 11 (ref 5–15)
BUN: 10 mg/dL (ref 6–20)
CO2: 18 mmol/L — ABNORMAL LOW (ref 22–32)
Calcium: 8.9 mg/dL (ref 8.9–10.3)
Chloride: 101 mmol/L (ref 98–111)
Creatinine, Ser: 0.74 mg/dL (ref 0.44–1.00)
GFR, Estimated: >60 mL/min (ref >60)
Glucose, Bld: 117 mg/dL — ABNORMAL HIGH (ref 70–99)
Potassium: 3.3 mmol/L — ABNORMAL LOW (ref 3.5–5.1)
Sodium: 130 mmol/L — ABNORMAL LOW (ref 135–145)
Total Bilirubin: 0.4 mg/dL (ref 0.3–1.2)
Total Protein: 7.9 g/dL (ref 6.5–8.1)

## 2021-04-01 LAB — URINALYSIS, ROUTINE W REFLEX MICROSCOPIC
Bacteria, UA: NONE SEEN
Bilirubin Urine: NEGATIVE
Glucose, UA: NEGATIVE mg/dL
Hgb urine dipstick: NEGATIVE
Ketones, ur: NEGATIVE mg/dL
Leukocytes,Ua: NEGATIVE
Nitrite: NEGATIVE
Protein, ur: 100 mg/dL — AB
Specific Gravity, Urine: 1.039 — ABNORMAL HIGH (ref 1.005–1.030)
pH: 6 (ref 5.0–8.0)

## 2021-04-01 LAB — CBC
HCT: 31.4 % — ABNORMAL LOW (ref 36.0–46.0)
Hemoglobin: 9.9 g/dL — ABNORMAL LOW (ref 12.0–15.0)
MCH: 26.7 pg (ref 26.0–34.0)
MCHC: 31.5 g/dL (ref 30.0–36.0)
MCV: 84.6 fL (ref 80.0–100.0)
Platelets: 283 10*3/uL (ref 150–400)
RBC: 3.71 MIL/uL — ABNORMAL LOW (ref 3.87–5.11)
RDW: 14 % (ref 11.5–15.5)
WBC: 13.6 10*3/uL — ABNORMAL HIGH (ref 4.0–10.5)
nRBC: 0 % (ref 0.0–0.2)

## 2021-04-01 LAB — SARS CORONAVIRUS 2 (TAT 6-24 HRS): SARS Coronavirus 2: NEGATIVE

## 2021-04-01 LAB — GAMMA GT: GGT: 122 U/L — ABNORMAL HIGH (ref 7–50)

## 2021-04-01 LAB — PREGNANCY, URINE: Preg Test, Ur: NEGATIVE

## 2021-04-01 MED ORDER — ACETAZOLAMIDE 250 MG PO TABS
250.0000 mg | ORAL_TABLET | Freq: Two times a day (BID) | ORAL | Status: DC
  Administered 2021-04-01 – 2021-04-08 (×14): 250 mg via ORAL
  Filled 2021-04-01 (×16): qty 1

## 2021-04-01 MED ORDER — OXYCODONE HCL 5 MG PO TABS
5.0000 mg | ORAL_TABLET | Freq: Four times a day (QID) | ORAL | Status: DC | PRN
  Administered 2021-04-01 – 2021-04-07 (×13): 5 mg via ORAL
  Filled 2021-04-01 (×15): qty 1

## 2021-04-01 MED ORDER — METOPROLOL SUCCINATE ER 25 MG PO TB24
25.0000 mg | ORAL_TABLET | Freq: Every day | ORAL | Status: DC
  Administered 2021-04-01 – 2021-04-08 (×8): 25 mg via ORAL
  Filled 2021-04-01 (×8): qty 1

## 2021-04-01 MED ORDER — PANTOPRAZOLE SODIUM 40 MG PO TBEC
40.0000 mg | DELAYED_RELEASE_TABLET | Freq: Two times a day (BID) | ORAL | Status: DC
  Administered 2021-04-01 – 2021-04-04 (×8): 40 mg via ORAL
  Filled 2021-04-01 (×8): qty 1

## 2021-04-01 MED ORDER — RIVAROXABAN 15 MG PO TABS
15.0000 mg | ORAL_TABLET | Freq: Two times a day (BID) | ORAL | Status: DC
  Administered 2021-04-01 – 2021-04-02 (×3): 15 mg via ORAL
  Filled 2021-04-01 (×5): qty 1

## 2021-04-01 MED ORDER — RIVAROXABAN 20 MG PO TABS: 20.0000 mg | ORAL_TABLET | Freq: Every day | ORAL | Status: DC

## 2021-04-01 MED ORDER — PANTOPRAZOLE SODIUM 40 MG IV SOLR: 40.0000 mg | Freq: Two times a day (BID) | INTRAVENOUS | Status: DC

## 2021-04-01 NOTE — ED Notes (Signed)
Pt at the bedside

## 2021-04-01 NOTE — Consult Note (Signed)
[facility]  [phone]   HEMATOLOGY ONCOLOGY INPATIENT CONSULTATION   Andrea Hayes  DOB: October 07, 1990  MR#: [id number]  CSN#: [id number]    Requesting Physician: [facility] Dr. Sharl Ma   Patient Care Team: Fleet Contras, MD as PCP - General (Internal Medicine) Drema Dallas, DO as Consulting Physician (Neurology) Clinton Gallant, RN as [facility] Care Management  Reason for consult: recurrent thrombosis   History of present illness:    30 yo female with PMH of recurrent extensive thrombosis, presented with acute severe left lower extremity pain, and was admitted for recurrent DVT.  Her CT angiogram yesterday showed persistent but improved DVT involving the right common iliac, right external iliac and the bilateral internal iliac vein.  Doppler showed acute DVT in bilateral lower extremity.  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  She underwent hypercoagulopathy work-up which was negative except low protein S activity (22), lupus anticoagulant and antiphospholipid syndrome antibodies were negative.  She was discharged home with Lovenox injection, but she was noncompliant and stopped on her own.  She does not remember how long she was on it. She was admitted on January 23, 2021 for near occlusive right side PE with pulmonary infarction, IVC thrombosis, Doppler was negative for lower extremity DVT.  Patient was treated with Lovenox injection and transition to Coumadin. I am not sure if she was compliant with coumadin.  Patient was readmitted on March 22, 2021 for epigastric and back pain for 2 weeks, CT scan showed extensive thrombosis involving infrarenal IVC, bilateral common iliac, right external iliac.  She was started on heparin drip, underwent tPA thrombolysis for 24 hours, then mechanical thrombectomy by vascular surgery on March 24, 2021.  She was transitioned to Xarelto. Per pt she has been compliant with Xarelto, no missing dose before current admission.  Of note, she had uneventful pregnancy and had a vaginal delivery in May 2022.  She was put on Lovenox during the pregnancy and after delivery, but she was not compliant with Lovenox because she is afraid of needles.  Patient denies family history of thrombosis, she is not a smoker, denies oral birth control pills.  She used to be taxi driver.   MEDICAL HISTORY:  Past Medical History:  Diagnosis Date   Stroke [facility])     SURGICAL HISTORY: Past Surgical History:  Procedure Laterality Date   NO PAST SURGERIES     PERIPHERAL VASCULAR BALLOON ANGIOPLASTY  03/24/2021   Procedure: PERIPHERAL VASCULAR BALLOON ANGIOPLASTY;  Surgeon: Leonie Douglas, MD;  Location: [facility] INVASIVE CV LAB;  Service: Cardiovascular;;  bilateral common iliacs   PERIPHERAL VASCULAR THROMBECTOMY N/A 03/23/2021   Procedure: PERIPHERAL VASCULAR THROMBECTOMY;  Surgeon: Maeola Harman, MD;  Location: [facility] INVASIVE CV LAB;  Service: Cardiovascular;  Laterality: N/A;   PERIPHERAL VASCULAR THROMBECTOMY N/A 03/24/2021   Procedure: LYSIS RECHECK;  Surgeon: Leonie Douglas, MD;  Location: [facility] INVASIVE CV LAB;  Service: Cardiovascular;  Laterality: N/A;    SOCIAL HISTORY: Social History   Socioeconomic History   Marital status: Single    Spouse name: Not on file   Number of children: Not on file   Years of education: Not on file   Highest education level: Not on file  Occupational History   Not on file  Tobacco Use   Smoking status: Never   Smokeless tobacco: Never  Vaping Use  Vaping Use: Never used  Substance and Sexual Activity   Alcohol use: No    Alcohol/week: 0.0 standard drinks   Drug use: No   Sexual activity: Yes    Birth control/protection: None  Other Topics Concern   Not on file  Social History Narrative   Right handed   Social Determinants of Health   Financial  Resource Strain: Not on file  Food Insecurity: Not on file  Transportation Needs: Not on file  Physical Activity: Not on file  Stress: Not on file  Social Connections: Not on file  Intimate Partner Violence: Not on file    FAMILY HISTORY: Family History  Problem Relation Age of Onset   Asthma Father    Obesity Neg Hx    Hypertension Neg Hx    Diabetes Neg Hx    Cancer Neg Hx     ALLERGIES:  has No Known Allergies.  MEDICATIONS:  Current Facility-Administered Medications  Medication Dose Route Frequency Provider Last Rate Last Admin   acetaZOLAMIDE (DIAMOX) tablet 250 mg  250 mg Oral BID Eduard Clos, MD       metoprolol succinate (TOPROL-XL) 24 hr tablet 25 mg  25 mg Oral Daily Eduard Clos, MD   25 mg at 04/01/21 1040   oxyCODONE (Oxy IR/ROXICODONE) immediate release tablet 5 mg  5 mg Oral Q6H PRN Meredeth Ide, MD   5 mg at 04/01/21 1635   pantoprazole (PROTONIX) EC tablet 40 mg  40 mg Oral BID Leta Baptist, PA-C   40 mg at 04/01/21 1635   Rivaroxaban (XARELTO) tablet 15 mg  15 mg Oral BID WC Eduard Clos, MD   15 mg at 04/01/21 1635   Followed by   Melene Muller ON 04/15/2021] rivaroxaban (XARELTO) tablet 20 mg  20 mg Oral Q supper Eduard Clos, MD        REVIEW OF SYSTEMS:   Constitutional: Denies fevers, chills or abnormal night sweats Eyes: Denies blurriness of vision, double vision or watery eyes Ears, nose, mouth, throat, and face: Denies mucositis or sore throat Respiratory: Denies cough, dyspnea or wheezes Cardiovascular: Denies palpitation, chest discomfort or lower extremity swelling Gastrointestinal:  Denies nausea, heartburn or change in bowel habits Skin: Denies abnormal skin rashes Lymphatics: Denies new lymphadenopathy or easy bruising Neurological:Denies numbness, tingling or new weaknesses Behavioral/Psych: Mood is stable, no new changes  All other systems were reviewed with the patient and are negative.  PHYSICAL  EXAMINATION: Vitals:   04/01/21 1500 04/01/21 1634  BP: 104/81 114/73  Pulse: (!) 103 (!) 104  Resp: 20 18  Temp:  98.3 F (36.8 C)  SpO2: 99% 100%   Filed Weights   03/31/21 1856  Weight: 168 lb 6.9 oz (76.4 kg)    GENERAL:alert, no distress and comfortable SKIN: skin color, texture, turgor are normal, no rashes or significant lesions EYES: normal, conjunctiva are pink and non-injected, sclera clear OROPHARYNX:no exudate, no erythema and lips, buccal mucosa, and tongue normal  NECK: supple, thyroid normal size, non-tender, without nodularity LYMPH:  no palpable lymphadenopathy in the cervical, axillary or inguinal LUNGS: clear to auscultation and percussion with normal breathing effort HEART: regular rate & rhythm and no murmurs and no lower extremity edema ABDOMEN:abdomen soft, non-tender and normal bowel sounds Musculoskeletal:no cyanosis of digits and no clubbing, no leg edema  PSYCH: alert & oriented x 3 with fluent speech NEURO: no focal motor/sensory deficits  LABORATORY DATA:  I have reviewed the data as listed Lab Results  Component Value Date   WBC 13.6 (H) 04/01/2021   HGB 9.9 (L) 04/01/2021   HCT 31.4 (L) 04/01/2021   MCV 84.6 04/01/2021   PLT 283 04/01/2021   Recent Labs    03/23/21 0530 03/24/21 0021 03/27/21 0124 03/31/21 2034 04/01/21 0502  NA 135   < > 135 131* 130*  K 3.9   < > 4.0 3.9 3.3*  CL 101   < > 99 101 101  CO2 24   < > 25 18* 18*  GLUCOSE 100*   < > 100* 105* 117*  BUN 5*   < > <5* 9 10  CREATININE 0.62   < > 0.57 0.65 0.74  CALCIUM 8.9   < > 8.9 9.5 8.9  GFRNONAA >60   < > >60 >60 >60  PROT 7.5   < > 6.6 8.3* 7.9  ALBUMIN 2.5*   < > 2.2* 2.7* 2.5*  AST 63*   < > 45* 141* 69*  ALT 105*   < > 39 80* 62*  ALKPHOS 248*   < > 175* 263* 258*  BILITOT 0.6   < > 0.7 0.3 0.4  BILIDIR 0.2  --   --   --   --   IBILI 0.4  --   --   --   --    < > = values in this interval not displayed.    RADIOGRAPHIC STUDIES: I have personally  reviewed the radiological images as listed and agreed with the findings in the report. CT Angio Chest Pulmonary Embolism (PE) W or WO Contrast  Result Date: 03/26/2021 CLINICAL DATA:  56 yoF with hx PE and cerebral thrombosis admitted with IVC thrombus. Pt is noncompliant with enoxaparin/warfarin PTA. EXAM: CT ANGIOGRAPHY CHEST WITH CONTRAST TECHNIQUE: Multidetector CT imaging of the chest was performed using the standard protocol during bolus administration of intravenous contrast. Multiplanar CT image reconstructions and MIPs were obtained to evaluate the vascular anatomy. CONTRAST:  OMNIPAQUE IOHEXOL 350 MG/ML SOLN COMPARISON:  01/23/2021. FINDINGS: Cardiovascular: Pulmonary arteries are well opacified. Focal pulmonary embolus noted in the anterior segmental branch to the right upper lobe, not evident on the prior CT. This may reflect distal propagation of prior thrombus or a new embolus. Pulmonary emboli noted to the right lower lobe on the prior study have resolved. There is no other evidence of pulmonary emboli. Heart is normal in size and configuration. No pericardial effusion. Great vessels are normal in caliber. Aberrant right subclavian artery. No aortic dissection or atherosclerosis. Mediastinum/Nodes: Prominent left prevascular lymph node, 1.1 cm short axis. Prominent right axillary lymph nodes, largest 1 cm short axis. These findings are stable. No mediastinal or hilar masses. Trachea and esophagus are unremarkable. Lungs/Pleura: Trace pleural effusions. Minor dependent subsegmental atelectasis in the lower lobes, right lower lobe atelectasis significantly improved from the prior CT. Lung volumes are low, study acquired in expiration. No evidence of pneumonia or pulmonary edema. No pneumothorax. Upper Abdomen: Unremarkable. Musculoskeletal: Normal. Review of the MIP images confirms the above findings. IMPRESSION: 1. Occlusive appearing pulmonary embolus in the anterior segmental pulmonary  artery of the right upper lobe, not evident on the prior CTA. 2. No other evidence of new pulmonary emboli. 3. Previously noted pulmonary emboli have resolved. 4. Significant interval improvement in right lower lobe atelectasis. Minimal atelectasis persisting in the dependent lung bases. Trace pleural effusions. Electronically Signed   By: Amie Portland M.D.   On: 03/26/2021 12:40   CT Abdomen Pelvis W Contrast  Result Date: 03/22/2021 CLINICAL DATA:  Abdominal infection suspected. EXAM: CT ABDOMEN AND PELVIS WITH CONTRAST TECHNIQUE: Multidetector CT imaging of the abdomen and pelvis was performed using the standard protocol following bolus administration of intravenous contrast. CONTRAST:  80mL OMNIPAQUE IOHEXOL 350 MG/ML SOLN COMPARISON:  CT abdomen and pelvis 07/13/2017. FINDINGS: Lower chest: There is a trace right pleural effusion. Hepatobiliary: No focal liver abnormality is seen. No gallstones, gallbladder wall thickening, or biliary dilatation. Pancreas: Unremarkable. No pancreatic ductal dilatation or surrounding inflammatory changes. Spleen: Normal in size without focal abnormality. Adrenals/Urinary Tract: Adrenal glands are unremarkable. Kidneys are normal, without renal calculi, focal lesion, or hydronephrosis. Bladder is unremarkable. Stomach/Bowel: Stomach is within normal limits. Appendix appears normal. No evidence of bowel wall thickening, distention, or inflammatory changes. Vascular/Lymphatic: The aorta appears within normal limits. There is occlusive thrombus throughout the entire infrarenal IVC. There is nonocclusive thrombus in the IVC at the level of the renal arteries and within the intrahepatic IVC. Opacification of the iliac veins is poor, but there are findings suspicious for occlusive thrombus within the right common and external iliac vein and possibly the left common iliac vein. There are prominent bilateral inguinal lymph nodes. Reproductive: Uterus is slightly prominent in size.  There is a 2.8 cm cyst in the left ovary. Right adnexa is unremarkable. Other: There is presacral edema as well as diffuse retroperitoneal edema surrounding the IVC. There is no ascites or free intraperitoneal air. Subcutaneous densities in the anterior abdominal wall are likely related to medication injection sites. Musculoskeletal: No acute or significant osseous findings. IMPRESSION: 1. Occlusive thrombus throughout the infrarenal IVC. Nonocclusive thrombus in the upper abdominal IVC. Findings highly suspicious for bilateral common iliac vein occlusive thrombus and right external iliac vein thrombus. 2. Presacral and retroperitoneal edema likely secondary to venous congestion. Infectious or inflammatory process cannot be excluded. 3. 2.8 cm left ovarian simple-appearing cyst. No follow-up imaging is recommended. Reference: JACR 2020 Feb;17(2):248-254 4. Trace right pleural effusion. 5. These results were called by telephone at the time of interpretation on 03/22/2021 at 10:01 pm to provider Dr. Hyacinth Meeker, who verbally acknowledged these results. Electronically Signed   By: Darliss Cheney M.D.   On: 03/22/2021 22:01   PERIPHERAL VASCULAR CATHETERIZATION  Result Date: 03/24/2021 Table formatting from the original result was not included. Images from the original result were not included. Signed                                                                          DATE OF SERVICE: 03/24/2021  PATIENT:  Saryah Pevey  30 y.o. female  PRE-OPERATIVE DIAGNOSIS:  IVC and bilateral iliac thrombus  POST-OPERATIVE DIAGNOSIS:  Same  PROCEDURE:  1) bilateral vena cava, common iliac, external iliac, common femoral, femoral vein intravascular ultrasound 2) bilateral iliofemoral venogram ( 3) bilateral vena cava, iliac, femoral endovascular thrombectomy (Inari ClotTriever) 4) right common iliac vein angioplasty (14 x 60mm Atlas) 5) conscious sedation (86 minutes).  SURGEON:  Rande Brunt. Lenell Antu, MD  ASSISTANT: none  ANESTHESIA:    local and IV sedation  ESTIMATED BLOOD LOSS:  LOCAL MEDICATIONS USED:  LIDOCAINE  COUNTS: confirmed correct.  PATIENT DISPOSITION:  PACU - hemodynamically stable.  Delay start of Pharmacological VTE  agent (>24hrs) due to surgical blood loss or risk of bleeding: no  INDICATION FOR PROCEDURE: Helene Arman is a 30 y.o. 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  Thrombolysis was initiated last night.  The patient had symptomatic improvement today.  After careful discussion of risks, benefits, and alternatives the patient was offered catheter recheck and possible endovascular thrombectomy. The patient  understood and wished to proceed.  OPERATIVE FINDINGS: Chronic thrombus remained in the confluence of the iliac veins and bilateral common iliac veins.  This appeared nearly occlusive on venogram.  Multiple passes of the Inari ClotTriever cleared much of the thrombus out of the bilateral iliac veins and IVC.  Completion venography shows restoration of flow through the pelvis into the IVC.  DESCRIPTION OF PROCEDURE: After identification of the patient in the pre-operative holding area, the patient was transferred to the operating room. The patient was positioned supine on the operating room table.  Anesthesia was induced. The popliteal fossae were prepped and draped in standard fashion. A surgical pause was performed confirming correct patient, procedure, and operative location.  Amplatz wires were advanced through the thrombolysis catheters, and the catheters were removed.  The bilateral 5 [place] sheaths were upsized to 8 [place].  Amplatz wires were delivered into the right internal jugular vein.  Intravascular ultrasound was performed over the wire evaluating the perihepatic IVC, perirenal IVC, infrarenal IVC, confluence of the iliac veins and bilateral common iliac veins, bilateral external iliac veins, bilateral common femoral veins, and bilateral femoral veins.  This  initial ultrasound revealed residual chronic thrombus in the confluence of the iliac veins and bilateral common iliac veins.  Bilateral lower extremity venograms were performed from the sheath.  This revealed patent veins below the inguinal ligaments.  Stagnation of flow was noted in the distal external iliac vein/common femoral vein bilaterally.  Patient was systemically heparinized. Endovascular thrombectomy was performed bilaterally.  The Inari ClotTriever was used to perform thrombectomy.  6 passes were used in the left lower extremity; 3 passes were performed in the right lower extremity.  Significant clot was returned after the first pass in each limb.  Less clot returned thereafter. I repeated the IVUS and showed there was still some common iliac chronic thrombus bilaterally.  Venogram was performed.  This showed a near occlusive chronic thrombus in the left common iliac vein.  I performed an angioplasty of this with a 14 x 40 Atlas balloon.  Follow-up angiogram showed loose chronic thrombus.  I repeated an Inari clot Cherre Blanc pass with good return.  Follow-up venogram shows resolution of the lesion. The right lower extremity pelvis was imaged with venography.  This showed some chronic thrombus in the common iliac vein.  This is not flow-limiting.  I elected to end the case here.  The Inari sheaths were removed from the popliteal veins and a 3-0 nylon stitch with bolster used to achieve hemostasis bilaterally.  Sterile bandages were applied.  Legs were wrapped with Ace wraps.  Conscious sedation was administered with the use of IV fentanyl and midazolam under continuous physician and nurse monitoring.  Heart rate, blood pressure, and oxygen saturation were continuously monitored.  Total sedation time was 86 minutes  Upon completion of the case instrument and sharps counts were confirmed correct. The patient was transferred to the PACU in good condition. I was present for all portions of the procedure.  PLAN:  Resume therapeutic anticoagulation 2 hours postprocedure.  Patient needs a minimum of 6  months of anticoagulation from my standpoint.  Return to floor.  Rande Brunt. Lenell Antu, MD [facility] Phone Number: [phone] 03/24/2021 3:48 PM     PERIPHERAL VASCULAR CATHETERIZATION  Result Date: 03/23/2021 Images from the original result were not included. Patient name: Andrea Hayes MRN: [id number] DOB: Aug 08, 1990 Sex: female 03/23/2021 Pre-operative Diagnosis: IVC, bilateral common iliac, bilateral external iliac and bilateral common femoral vein DVT Post-operative diagnosis:  Same Surgeon:  Luanna Salk. Randie Heinz, MD Procedure Performed: 1.  Ultrasound-guided cannulation bilateral popliteal veins 2.  IVC venogram 3.  Placement of IVC bilateral common iliac vein, bilateral external iliac vein bilateral common femoral vein and bilateral femoral vein 50 cm treatment length UniFuse catheters 4.  Moderate sedation with fentanyl and Versed for 35 minutes Indications: 30 year old female presented with abdominal pain was found to have extensive DVT from her IVC extending down to her bilateral common femoral veins.  Also has bilateral lower extremity swelling.  She is indicated for lytic catheter placement. Findings: Bilateral popliteal veins were distended but they were compressible.  The did have bleeding when the micropuncture sheath was placed.  By central venogram there did appear to be thrombus extending up to the level of the hepatic veins.  There was difficulty placing a wire past the bilateral common iliac veins into the IVC confluence this required catheter direction.  Bilateral UniFuse catheters were placed that were 50 cm treatment length from the distal IVC down to the mid femoral veins.  Procedure:  The patient was identified in the holding area and taken to room 8.  The patient was then placed prone on the table and prepped and draped in the usual sterile fashion.  A time out was  called.  Ultrasound was used to evaluate first the left popliteal vein.  This was large but was compressible.  There is no spasm facility again cannulated with micropuncture needle followed by wire and sheath.  We then placed a Glidewire.  A 5 French sheath was placed.  Fluoroscopically we advanced the Glidewire to the level of the left common iliac vein.  A Berenstein catheter was placed.  Was directed to the IVC.  At the terminal IVC we performed bleeding venogram which demonstrated thrombus to the level of the hepatic veins.  We placed UniFuse catheter as well as the occluder wire.  We turned our attention to the right leg.  Ultrasound used to identify the popliteal vein on that side.  This also was enlarged although somewhat compressible.  The area again was anesthetized 1% lidocaine cannulated micropuncture needle followed by wire and sheath.  Glidewire was placed followed by 5 [place] sheath.  Again the Glidewire did hang up at the common iliac vein on the right.  We used Berenstein catheter directed the IVC and again perform IVC venogram at the terminal IVC to confirm intraluminal access.  The UniFuse catheter was placed with the occluded wire.  Single shot images were taken centrally and peripherally to demonstrate the placement of the catheter.  Both catheters were flushed.  They will be helped tPA.  She tolerated procedure without any complication. Contrast: 20 cc Brandon C. Randie Heinz, MD [facility] [place] Office: [phone] Pager: [phone]   VAS [place] LOWER EXTREMITY VENOUS (DVT)  Result Date: 04/01/2021  Lower Venous DVT Study Patient Name:  Andrea Hayes  Date of Exam:   04/01/2021 Medical Rec #: [id number]       Accession #:    [id number] Date of  Birth: [birth year]       Patient Gender: F Patient Age:   29 years Exam Location:  [facility] Procedure:      VAS [place] LOWER EXTREMITY VENOUS (DVT) Referring Phys: Midge Minium  --------------------------------------------------------------------------------  Indications: Pain, and Swelling.  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 Limitations: Patient unable to tolerate compression. Bandage behind knee, patient refused to let me remove it. Comparison Study: Prior negative bilateral lower extremity venous duplex done                   01/23/21 Performing Technologist: Sherren Kerns RVS  Examination Guidelines: A complete evaluation includes B-mode imaging, spectral Doppler, color Doppler, and power Doppler as needed of all accessible portions of each vessel. Bilateral testing is considered an integral part of a complete examination. Limited examinations for reoccurring indications may be performed as noted. The reflux portion of the exam is performed with the patient in reverse Trendelenburg.  +-------+---------------+---------+-----------+----------+-----------------+ RIGHT  CompressibilityPhasicitySpontaneityPropertiesThrombus Aging    +-------+---------------+---------+-----------+----------+-----------------+ CFV    Partial        No       No                   Age Indeterminate +-------+---------------+---------+-----------+----------+-----------------+ FV ProxPartial        No       No                                     +-------+---------------+---------+-----------+----------+-----------------+ PFV    Partial        No       No                                     +-------+---------------+---------+-----------+----------+-----------------+   +---------+---------------+---------+-----------+----------+-------------------+ LEFT     CompressibilityPhasicitySpontaneityPropertiesThrombus Aging      +---------+---------------+---------+-----------+----------+-------------------+ CFV      Full           Yes      Yes                                       +---------+---------------+---------+-----------+----------+-------------------+ SFJ      Full                                                             +---------+---------------+---------+-----------+----------+-------------------+ FV Prox                 No       No                   Acute               +---------+---------------+---------+-----------+----------+-------------------+ FV Mid                  No       No                   Acute               +---------+---------------+---------+-----------+----------+-------------------+  FV Distal               No       No                   Acute               +---------+---------------+---------+-----------+----------+-------------------+ PFV                     Yes      Yes                  patent by color and                                                       Doppler             +---------+---------------+---------+-----------+----------+-------------------+ POP                                                   unable to visualize                                                       secondary to                                                              bandage             +---------+---------------+---------+-----------+----------+-------------------+ PTV      None           No       No                   Acute               +---------+---------------+---------+-----------+----------+-------------------+ PERO                                                  Not well visualized +---------+---------------+---------+-----------+----------+-------------------+    Summary: RIGHT: - Findings consistent with age indeterminate deep vein thrombosis involving the right common femoral vein, right femoral vein, and right proximal profunda vein. - Ultrasound characteristics of enlarged lymph nodes are noted in the groin.  LEFT: - Findings consistent with acute deep vein thrombosis involving  the left femoral vein, and left posterior tibial veins. - Ultrasound characteristics of enlarged lymph nodes noted in the groin.  *See table(s) above for measurements and observations.    Preliminary    CT Angio Abd/Pel W and/or Wo Contrast  Result Date: 03/31/2021 CLINICAL DATA:  Acute mesenteric ischemia, history of IVC thrombosis and DVT EXAM: CTA ABDOMEN AND PELVIS WITHOUT AND WITH CONTRAST TECHNIQUE: Multidetector CT imaging of the abdomen and  pelvis was performed using the standard protocol during bolus administration of intravenous contrast. Multiplanar reconstructed images and MIPs were obtained and reviewed to evaluate the vascular anatomy. CONTRAST:  75mL OMNIPAQUE IOHEXOL 350 MG/ML SOLN COMPARISON:  03/22/2021 FINDINGS: VASCULAR Aorta: Normal caliber aorta without aneurysm, dissection, vasculitis or significant stenosis. Celiac: Patent without evidence of aneurysm, dissection, vasculitis or significant stenosis. SMA: Patent without evidence of aneurysm, dissection, vasculitis or significant stenosis. Renals: Both renal arteries are patent without evidence of aneurysm, dissection, vasculitis, fibromuscular dysplasia or significant stenosis. IMA: Patent without evidence of aneurysm, dissection, vasculitis or significant stenosis. Inflow: Patent without evidence of aneurysm, dissection, vasculitis or significant stenosis. Proximal Outflow: Bilateral common femoral and visualized portions of the superficial and profunda femoral arteries are patent without evidence of aneurysm, dissection, vasculitis or significant stenosis. 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 There is continued acute DVT involving the right common iliac, right external iliac, and bilateral internal iliac veins. Overall decreased clot burden since prior study. Review of the MIP images confirms the above findings. NON-VASCULAR Lower chest: No acute pleural or parenchymal lung disease. Hepatobiliary: No focal  liver abnormality is seen. No gallstones, gallbladder wall thickening, or biliary dilatation. Pancreas: Unremarkable. No pancreatic ductal dilatation or surrounding inflammatory changes. Spleen: Normal in size without focal abnormality. Adrenals/Urinary Tract: Adrenal glands are unremarkable. Kidneys are normal, without renal calculi, focal lesion, or hydronephrosis. Bladder is unremarkable. Stomach/Bowel: No bowel obstruction or ileus. Normal appendix right lower quadrant. No bowel wall thickening or inflammatory change. No evidence of mesenteric ischemia. Lymphatic: Stable borderline enlarged bilateral iliac chain lymph nodes may be reactive. No other pathologic adenopathy. Reproductive: 3.2 cm simple appearing left ovarian cyst. Right ovary and uterus are unremarkable. Other: Decreased presacral edema consistent with improving DVT. No free intraperitoneal fluid or free gas. Musculoskeletal: There are no acute or destructive bony lesions. 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 Reconstructed images demonstrate no additional findings. IMPRESSION: VASCULAR 1. No evidence of mesenteric ischemia. Arterial structures are widely patent. 2. Persistent but improving deep venous thrombosis involving the right common iliac, right external iliac, and bilateral internal iliac veins. The IVC thrombus seen previously has resolved in the interim. NON-VASCULAR 1. No bowel obstruction or ileus. No evidence of mesenteric ischemia. 2. 3.2 cm simple left ovarian cyst. No follow-up imaging is recommended. 3. Likely reactive borderline enlarged lymph nodes along the bilateral iliac and inguinal distributions. Electronically Signed   By: Sharlet Salina M.D.   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  No clinical or lab evidence of antiphospholipid syndrome   Recommendations: -Not sure the exact cause of her recurrent extensive thrombosis, she may have protein as deficiency (in the setting of acute DVT, protein S level could be falsely low, so not diagnostic.  -Regardless of the etiology, I recommend lifelong anticoagulation given the extensive DVT -Per patient, she has been very compliant with Xarelto after the recent episodes of thrombosis a week ago, this is concerning for Xarelto failure -I recommend switching Xarelto (she received her last dose 15 mg this  afternoon)  to heparin drip in the hospital, and started her on Coumadin tomorrow with target INR 2.5-3.5, prefer to keep her in hospital until INR therapeutic. She was on Coumadin before, we can start at her previous dose. -I discussed the importance of compliance and lab monitoring with Coumadin, also reviewed the diet restriction, she is in agreement, follow-up with her family doctor for PT/INR monitoring. -I also recommend no birth control pill, not smoking, active lifestyle etc. to reduce her risk of thrombosis -I will see her back in 3 to 4 weeks in my clinic.  -I discussed with Dr. Sharl Ma   All questions were answered. The patient knows to call the clinic with any problems, questions or concerns.      Malachy Mood, MD 04/01/2021 5:07 PM

## 2021-04-01 NOTE — H&P (Signed)
History and Physical    Trinda Schissler ZOX:[id number] DOB: 18-Apr-1991 DOA: 03/31/2021  PCP: Fleet Contras, MD  Patient coming from: Home.  Chief Complaint: Left thigh pain.  HPI: Andrea Hayes is a 30 y.o. 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  After 24 hours of tPA patient underwent mechanical thrombectomy with with vascular surgery.  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  Patient subsequently which also had new pulmonary embolism.  Patient was discharged home on Xarelto advising not to breast-feed as patient is lactating.  Presents to the ER with worsening left anterior leg pain over the last 24 hours.  Patient states her pain is making her difficult to walk.  Patient also has been having epigastric and back pain which has been present since her last admission.  ED Course: In the ER CT abdomen pelvis did not show any mesenteric ischemia and showed distant but improving right common iliac and right external iliac and bilateral internal iliac DVTs and IVC thrombus has resolved.  Stool for occult blood was positive but patient has not noticed any obvious GI bleed.  Hemoglobin is at baseline LFTs are elevated and has been so during last admission but mildly elevated more than the one at discharge on March 26, 2021.  COVID test and pregnancy screen are pending.  Review of Systems: As per HPI, rest all negative.   Past Medical History:  Diagnosis Date   Stroke [facility])     Past Surgical History:  Procedure Laterality Date   NO  PAST SURGERIES     PERIPHERAL VASCULAR BALLOON ANGIOPLASTY  03/24/2021   Procedure: PERIPHERAL VASCULAR BALLOON ANGIOPLASTY;  Surgeon: Leonie Douglas, MD;  Location: [facility] INVASIVE CV LAB;  Service: Cardiovascular;;  bilateral common iliacs   PERIPHERAL VASCULAR THROMBECTOMY N/A 03/23/2021   Procedure: PERIPHERAL VASCULAR THROMBECTOMY;  Surgeon: Maeola Harman, MD;  Location: [facility] INVASIVE CV LAB;  Service: Cardiovascular;  Laterality: N/A;   PERIPHERAL VASCULAR THROMBECTOMY N/A 03/24/2021   Procedure: LYSIS RECHECK;  Surgeon: Leonie Douglas, MD;  Location: [facility] INVASIVE CV LAB;  Service: Cardiovascular;  Laterality: N/A;     reports that she has never smoked. She has never used smokeless tobacco. She reports that she does not drink alcohol and does not use drugs.  No Known Allergies  Family History  Problem Relation Age of Onset   Asthma Father    Obesity Neg Hx    Hypertension Neg Hx    Diabetes Neg Hx    Cancer Neg Hx     Prior to Admission medications   Medication Sig Start Date End Date Taking? Authorizing Provider  acetaZOLAMIDE (DIAMOX) 125 MG tablet Take 2 tablets (250 mg total) by mouth 2 (two) times daily. 12/02/20  Yes Everlena Cooper, Adam R, DO  metoprolol succinate (TOPROL-XL) 25 MG 24 hr tablet Take 1 tablet (25 mg total) by mouth daily. 03/27/21  Yes Arnetha Courser, MD  RIVAROXABAN Carlena Hurl) VTE STARTER PACK (15 & 20 MG) Follow package directions: Take one  tablet by mouth twice a day. On day 22, switch to one  tablet once a  day. Take with food. 03/27/21  Yes Arnetha Courser, MD  Vitamin D, Ergocalciferol, (DRISDOL) 1.25 MG (50000 UNIT) CAPS capsule Take 50,000 Units by mouth every Wednesday. 02/24/21  Yes [provider]  acetaminophen (TYLENOL) 325 MG tablet Take 2 tablets (650 mg total) by mouth every 6 (six) hours as needed for mild pain or headache (do not take over 3000 mg in a day of tylenol. please note there is tylenol in the fiorecet). Patient not taking: No  sig reported 07/29/18   Layne Benton, NP  ferrous fumarate-b12-vitamic C-folic acid (TRINSICON / FOLTRIN) capsule Take 1 capsule by mouth 2 (two) times daily after a meal. Patient not taking: No sig reported 03/27/21   Arnetha Courser, MD  HYDROcodone-acetaminophen (NORCO/VICODIN) 5-325 MG tablet Take 1-2 tablets by mouth every 4 (four) hours as needed for moderate pain or severe pain. Patient not taking: No sig reported 03/27/21   Arnetha Courser, MD  Prenatal Vit-Fe Fumarate-FA (PRENATAL VITAMIN) 27-0.8 MG TABS Take 1 tablet by mouth daily. Patient not taking: Reported on 03/31/2021 04/19/20   Aviva Signs, CNM  rivaroxaban (XARELTO) 20 MG TABS tablet Take 1 tablet (20 mg total) by mouth daily with supper. 04/15/21   Arnetha Courser, MD    Physical Exam: Constitutional: Moderately built and nourished. Vitals:   03/31/21 2230 04/01/21 0030 04/01/21 0230 04/01/21 0346  BP: 106/70 106/70  95/71  Pulse: (!) 103 (!) 105 (!) 104 (!) 104  Resp: (!) 26 (!) 22 19 16   Temp:      TempSrc:      SpO2: 99% 97% 99% 97%  Weight:      Height:       Eyes: Anicteric no pallor. ENMT: No discharge from the ears eyes nose and mouth. Neck: No mass felt.  No neck rigidity. Respiratory: No rhonchi or crepitations. Cardiovascular: S1-S2 heard. Abdomen: Soft nontender bowel sounds present. Musculoskeletal: No edema.  Patient has pain on moving the left lower extremity but has good sensation no acute ischemic changes. Skin: No rash. Neurologic: Alert awake oriented time place and person.  Moves all extremities. Psychiatric: Appears normal.  Normal affect.   Labs on Admission: I have personally reviewed following labs and imaging studies  CBC: Recent Labs  Lab 03/26/21 0409 03/27/21 0721 03/31/21 2034  WBC 8.5 7.4 14.1*  NEUTROABS 5.8  --  10.4*  HGB 11.1* 10.4* 11.0*  HCT 35.3* 33.5* 35.3*  MCV 86.1 85.0 85.3  PLT 101* 97* 304   Basic Metabolic Panel: Recent Labs  Lab 03/26/21 0409  03/27/21 0124 03/31/21 2034  NA 137 135 131*  K 4.0 4.0 3.9  CL 101 99 101  CO2 25 25 18*  GLUCOSE 87 100* 105*  BUN <5* <5* 9  CREATININE 0.53 0.57 0.65  CALCIUM 9.0 8.9 9.5   GFR: Estimated Creatinine Clearance: 105.2 mL/min (by C-G formula based on SCr of 0.65 mg/dL). Liver Function Tests: Recent Labs  Lab 03/26/21 0409 03/27/21 0124 03/31/21 2034  AST 42* 45* 141*  ALT 45* 39 80*  ALKPHOS 193* 175* 263*  BILITOT 0.3 0.7 0.3  PROT 7.0 6.6 8.3*  ALBUMIN 2.3* 2.2* 2.7*   Recent Labs  Lab 03/31/21 2034  LIPASE 34   No results for input(s): AMMONIA in the last 168 hours. Coagulation Profile: No results for input(s): INR, PROTIME in the last 168 hours. Cardiac Enzymes: No results for input(s): CKTOTAL, CKMB, CKMBINDEX, TROPONINI in the last 168 hours. BNP (last 3 results) No results for input(s):  PROBNP in the last 8760 hours. HbA1C: No results for input(s): HGBA1C in the last 72 hours. CBG: Recent Labs  Lab 03/26/21 1105 03/26/21 1610 03/26/21 1948 03/27/21 0629 03/27/21 1110  GLUCAP 104* 92 88 75 91   Lipid Profile: No results for input(s): CHOL, HDL, LDLCALC, TRIG, CHOLHDL, LDLDIRECT in the last 72 hours. Thyroid Function Tests: No results for input(s): TSH, T4TOTAL, FREET4, T3FREE, THYROIDAB in the last 72 hours. Anemia Panel: No results for input(s): VITAMINB12, FOLATE, FERRITIN, TIBC, IRON, RETICCTPCT in the last 72 hours. Urine analysis:    Component Value Date/Time   COLORURINE YELLOW 04/01/2021 0008   APPEARANCEUR CLEAR 04/01/2021 0008   LABSPEC 1.039 (H) 04/01/2021 0008   PHURINE 6.0 04/01/2021 0008   GLUCOSEU NEGATIVE 04/01/2021 0008   HGBUR NEGATIVE 04/01/2021 0008   BILIRUBINUR NEGATIVE 04/01/2021 0008   BILIRUBINUR negaive 10/17/2020 1617   KETONESUR NEGATIVE 04/01/2021 0008   PROTEINUR 100 (A) 04/01/2021 0008   UROBILINOGEN 0.2 10/17/2020 1617   UROBILINOGEN 0.2 10/23/2012 1135   NITRITE NEGATIVE 04/01/2021 0008   LEUKOCYTESUR  NEGATIVE 04/01/2021 0008   Sepsis Labs: @LABRCNTIP (procalcitonin:4,lacticidven:4) ) Recent Results (from the past 240 hour(s))  Resp Panel by RT-PCR (Flu A&B, Covid) Nasopharyngeal Swab     Status: None   Collection Time: 03/22/21 10:08 PM   Specimen: Nasopharyngeal Swab; Nasopharyngeal(NP) swabs in vial transport medium  Result Value Ref Range Status   SARS Coronavirus 2 by RT PCR NEGATIVE NEGATIVE Final    Comment: (NOTE) SARS-CoV-2 target nucleic acids are NOT DETECTED.  The SARS-CoV-2 RNA is generally detectable in upper respiratory specimens during the acute phase of infection. The lowest concentration of SARS-CoV-2 viral copies this assay can detect is 138 copies/mL. A negative result does not preclude SARS-Cov-2 infection and should not be used as the sole basis for treatment or other patient management decisions. A negative result may occur with  improper specimen collection/handling, submission of specimen other than nasopharyngeal swab, presence of viral mutation(s) within the areas targeted by this assay, and inadequate number of viral copies(<138 copies/mL). A negative result must be combined with clinical observations, patient history, and epidemiological information. The expected result is Negative.  Fact Sheet for Patients:  03/24/21  Fact Sheet for Healthcare Providers:  BloggerCourse.com  This test is no t yet approved or cleared by the [employer] FDA and  has been authorized for detection and/or diagnosis of SARS-CoV-2 by FDA under an Emergency Use Authorization (EUA). This EUA will remain  in effect (meaning this test can be used) for the duration of the COVID-19 declaration under Section 564(b)(1) of the Act, 21 U.S.C.section 360bbb-3(b)(1), unless the authorization is terminated  or revoked sooner.       Influenza A by PCR NEGATIVE NEGATIVE Final   Influenza B by PCR NEGATIVE NEGATIVE Final     Comment: (NOTE) The Xpert Xpress SARS-CoV-2/FLU/RSV plus assay is intended as an aid in the diagnosis of influenza from Nasopharyngeal swab specimens and should not be used as a sole basis for treatment. Nasal washings and aspirates are unacceptable for Xpert Xpress SARS-CoV-2/FLU/RSV testing.  Fact Sheet for Patients: [place]  Fact Sheet for Healthcare Providers: BloggerCourse.com  This test is not yet approved or cleared by the [employer] FDA and has been authorized for detection and/or diagnosis of SARS-CoV-2 by FDA under an Emergency Use Authorization (EUA). This EUA will remain in effect (meaning this test can be used) for the duration of the COVID-19 declaration under Section 564(b)(1) of the Act, 21  U.S.C. section 360bbb-3(b)(1), unless the authorization is terminated or revoked.  Performed at [facility], [address], [place], [place] [address]      Radiological Exams on Admission: CT Angio Abd/Pel W and/or Wo Contrast  Result Date: 03/31/2021 CLINICAL DATA:  Acute mesenteric ischemia, history of IVC thrombosis and DVT EXAM: CTA ABDOMEN AND PELVIS WITHOUT AND WITH CONTRAST TECHNIQUE: Multidetector CT imaging of the abdomen and pelvis was performed using the standard protocol during bolus administration of intravenous contrast. Multiplanar reconstructed images and MIPs were obtained and reviewed to evaluate the vascular anatomy. CONTRAST:  60mL OMNIPAQUE IOHEXOL 350 MG/ML SOLN COMPARISON:  03/22/2021 FINDINGS: VASCULAR Aorta: Normal caliber aorta without aneurysm, dissection, vasculitis or significant stenosis. Celiac: Patent without evidence of aneurysm, dissection, vasculitis or significant stenosis. SMA: Patent without evidence of aneurysm, dissection, vasculitis or significant stenosis. Renals: Both renal arteries are patent without evidence of aneurysm, dissection, vasculitis, fibromuscular dysplasia or  significant stenosis. IMA: Patent without evidence of aneurysm, dissection, vasculitis or significant stenosis. Inflow: Patent without evidence of aneurysm, dissection, vasculitis or significant stenosis. Proximal Outflow: Bilateral common femoral and visualized portions of the superficial and profunda femoral arteries are patent without evidence of aneurysm, dissection, vasculitis or significant stenosis. 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 There is continued acute DVT involving the right common iliac, right external iliac, and bilateral internal iliac veins. Overall decreased clot burden since prior study. Review of the MIP images confirms the above findings. NON-VASCULAR Lower chest: No acute pleural or parenchymal lung disease. Hepatobiliary: No focal liver abnormality is seen. No gallstones, gallbladder wall thickening, or biliary dilatation. Pancreas: Unremarkable. No pancreatic ductal dilatation or surrounding inflammatory changes. Spleen: Normal in size without focal abnormality. Adrenals/Urinary Tract: Adrenal glands are unremarkable. Kidneys are normal, without renal calculi, focal lesion, or hydronephrosis. Bladder is unremarkable. Stomach/Bowel: No bowel obstruction or ileus. Normal appendix right lower quadrant. No bowel wall thickening or inflammatory change. No evidence of mesenteric ischemia. Lymphatic: Stable borderline enlarged bilateral iliac chain lymph nodes may be reactive. No other pathologic adenopathy. Reproductive: 3.2 cm simple appearing left ovarian cyst. Right ovary and uterus are unremarkable. Other: Decreased presacral edema consistent with improving DVT. No free intraperitoneal fluid or free gas. Musculoskeletal: There are no acute or destructive bony lesions. 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 Reconstructed images demonstrate no additional findings. IMPRESSION: VASCULAR 1. No evidence of mesenteric  ischemia. Arterial structures are widely patent. 2. Persistent but improving deep venous thrombosis involving the right common iliac, right external iliac, and bilateral internal iliac veins. The IVC thrombus seen previously has resolved in the interim. NON-VASCULAR 1. No bowel obstruction or ileus. No evidence of mesenteric ischemia. 2. 3.2 cm simple left ovarian cyst. No follow-up imaging is recommended. 3. Likely reactive borderline enlarged lymph nodes along the bilateral iliac and inguinal distributions. Electronically Signed   By: Sharlet Salina M.D.   On: 03/31/2021 23:05      Assessment/Plan Principal Problem:   Lower extremity pain, anterior, left Active Problems:   Abdominal pain    Left lower extremity pain mostly in the left anterior thigh area with recent admission for occlusive thrombus of the infrarenal IVC and bilateral common iliac right external iliac for which patient had underwent thrombolysis and mechanical thrombectomy and PCI placement.  Presently on Xarelto.  Will consult vascular surgery for further recommendations.  Check Dopplers of the lower extremity. Prior history of cerebrovascular thrombosis and pulmonary  embolism presently on Xarelto. Anemia follow CBC.  On iron supplements. Idiopathic intracranial hypertension on acetazolamide. Hypertension on metoprolol. Stool for occult blood is positive but patient has not noticed any gross bleeding.  We will closely monitor.  Pregnancy screen and COVID test are pending.   DVT prophylaxis: Xarelto.  Discussed with pharmacy about dosing. Code Status: Full code. Family Communication: Discussed with patient. Disposition Plan: Home. Consults called: We will consult vascular surgery. Admission status: Observation.   Eduard Clos MD [facility] Pager [phone].  If 7PM-7AM, please contact night-coverage [email or web address] Password [facility]  04/01/2021, 3:49 AM

## 2021-04-01 NOTE — ED Notes (Signed)
Breakfast Ordered 

## 2021-04-01 NOTE — ED Notes (Signed)
Pt received breakfast tray, MD at bedside. Pt requesting meds for back pain.

## 2021-04-01 NOTE — Plan of Care (Signed)
  Problem: Education: Goal: Knowledge of General Education information will improve Description Including pain rating scale, medication(s)/side effects and non-pharmacologic comfort measures Outcome: Progressing   

## 2021-04-01 NOTE — Consult Note (Signed)
[facility]  Patient name: Andrea Hayes MRN: [id number] DOB: 01-19-1991 Sex: female   REQUESTING PROVIDER:   Hospital service   REASON FOR CONSULT:    Left leg pain  HISTORY OF PRESENT ILLNESS:   Andrea Hayes is a 30 y.o. female, who I met on 03/23/2021 when she presented to the ER with back and abdominal pain.  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  She also had elevated LFTs and thrombus was visualized above the renal veins.  She had a history of a PE several months ago that was treated with anticoagulation however she had stopped taking it.  She is approximately 5 months postpartum via a vaginal delivery.  On 03/23/2021, the patient went to the Cath Lab and had bilateral catheters placed via the popliteal vein into the IVC for lytic infusion overnight.  The following day she went back to the Cath Lab and had mechanical thrombectomy of her vena cava, iliac, and femoral veins.  She was transition to oral anticoagulation.  She was found to have a new PE following her intervention on her vena cava occlusion.  Anticoagulation was the treatment choice.  Compression stockings were placed and she was ultimately discharged home.  She returned to the emergency department on 04/01/2021 with complaints of left leg and back pain.  She was found to the occult positive.  She is intermittently wearing her compression socks.  PAST MEDICAL HISTORY    Past Medical History:  Diagnosis Date   Stroke [facility])      FAMILY HISTORY   Family History  Problem Relation Age of Onset   Asthma Father    Obesity Neg Hx    Hypertension Neg Hx    Diabetes Neg Hx    Cancer Neg Hx     SOCIAL HISTORY:   Social History   Socioeconomic History   Marital status: Single    Spouse name: Not on file   Number of children: Not on file   Years of education: Not on file   Highest education level: Not on file  Occupational History    Not on file  Tobacco Use   Smoking status: Never   Smokeless tobacco: Never  Vaping Use   Vaping Use: Never used  Substance and Sexual Activity   Alcohol use: No    Alcohol/week: 0.0 standard drinks   Drug use: No   Sexual activity: Yes    Birth control/protection: None  Other Topics Concern   Not on file  Social History Narrative   Right handed   Social Determinants of Health   Financial Resource Strain: Not on file  Food Insecurity: Not on file  Transportation Needs: Not on file  Physical Activity: Not on file  Stress: Not on file  Social Connections: Not on file  Intimate Partner Violence: Not on file    ALLERGIES:    No Known Allergies  CURRENT MEDICATIONS:    Current Facility-Administered Medications  Medication Dose Route Frequency Provider Last Rate Last Admin   acetaZOLAMIDE (DIAMOX) tablet 250 mg  250 mg Oral BID Rise Patience, MD       metoprolol succinate (TOPROL-XL) 24 hr tablet 25 mg  25 mg Oral Daily Rise Patience, MD       Rivaroxaban (XARELTO) tablet 15 mg  15 mg Oral BID WC Rise Patience, MD   15 mg at 04/01/21 0458   Followed by   Derrill Memo ON 04/15/2021] rivaroxaban (XARELTO) tablet 20 mg  20 mg Oral Q supper Rise Patience, MD       Current Outpatient Medications  Medication Sig Dispense Refill   acetaZOLAMIDE (DIAMOX) 125 MG tablet Take 2 tablets (250 mg total) by mouth 2 (two) times daily. 120 tablet 5   metoprolol succinate (TOPROL-XL) 25 MG 24 hr tablet Take 1 tablet (25 mg total) by mouth daily. 30 tablet 1   RIVAROXABAN (XARELTO) VTE STARTER PACK (15 & 20 MG) Follow package directions: Take one 64m tablet by mouth twice a day. On day 22, switch to one 248mtablet once a day. Take with food. 51 each 0   Vitamin D, Ergocalciferol, (DRISDOL) 1.25 MG (50000 UNIT) CAPS capsule Take 50,000 Units by mouth every Wednesday.     acetaminophen (TYLENOL) 325 MG tablet Take 2 tablets (650 mg total) by mouth every 6 (six) hours  as needed for mild pain or headache (do not take over 3000 mg in a day of tylenol. please note there is tylenol in the fiorecet). (Patient not taking: No sig reported)     ferrous [id number]-folic acid (TRINSICON / FOLTRIN) capsule Take 1 capsule by mouth 2 (two) times daily after a meal. (Patient not taking: No sig reported) 180 capsule 0   HYDROcodone-acetaminophen (NORCO/VICODIN) 5-325 MG tablet Take 1-2 tablets by mouth every 4 (four) hours as needed for moderate pain or severe pain. (Patient not taking: No sig reported) 30 tablet 0   Prenatal Vit-Fe Fumarate-FA (PRENATAL VITAMIN) 27-0.8 MG TABS Take 1 tablet by mouth daily. (Patient not taking: Reported on 03/31/2021) 30 tablet 12   [START ON 04/15/2021] rivaroxaban (XARELTO) 20 MG TABS tablet Take 1 tablet (20 mg total) by mouth daily with supper. 30 tablet 6    REVIEW OF SYSTEMS:   [X]  denotes positive finding, [ ]  denotes negative finding Cardiac  Comments:  Chest pain or chest pressure:    Shortness of breath upon exertion:    Short of breath when lying flat:    Irregular heart rhythm:        Vascular    Pain in calf, thigh, or hip brought on by ambulation:    Pain in feet at night that wakes you up from your sleep:     Blood clot in your veins:    Leg swelling:  x       Pulmonary    Oxygen at home:    Productive cough:     Wheezing:         Neurologic    Sudden weakness in arms or legs:     Sudden numbness in arms or legs:     Sudden onset of difficulty speaking or slurred speech:    Temporary loss of vision in one eye:     Problems with dizziness:         Gastrointestinal    Blood in stool:      Vomited blood:         Genitourinary    Burning when urinating:     Blood in urine:        Psychiatric    Major depression:         Hematologic    Bleeding problems:    Problems with blood clotting too easily:        Skin    Rashes or ulcers:        Constitutional    Fever or chills:     PHYSICAL  EXAM:   Vitals:   04/01/21 0230 04/01/21  0346 04/01/21 0600 04/01/21 0700  BP:  95/71 110/72 118/73  Pulse: (!) 104 (!) 104 (!) 103 (!) 102  Resp: 19 16 16 20   Temp:   98.2 F (36.8 C)   TempSrc:   Oral   SpO2: 99% 97% 98% 99%  Weight:      Height:        GENERAL: The patient is a well-nourished female, in no acute distress. The vital signs are documented above. CARDIAC: There is a regular rate and rhythm.  VASCULAR: Both legs are warm and well-perfused.  (They are Doppler not working).  There is no signs of arterial insufficiency in the leg.  She has minimal edema bilaterally.  I do not feel any fullness in the popliteal fossa. PULMONARY: Nonlabored respirations ABDOMEN: Soft and non-tender with normal pitched bowel sounds.  MUSCULOSKELETAL: There are no major deformities or cyanosis. NEUROLOGIC: No focal weakness or paresthesias are detected. SKIN: There are no ulcers or rashes noted. PSYCHIATRIC: The patient has a normal affect.  STUDIES:   I have reviewed her CT scan with the following findings: VASCULAR   1. No evidence of mesenteric ischemia. Arterial structures are widely patent. 2. Persistent but improving deep venous thrombosis involving the right common iliac, right external iliac, and bilateral internal iliac veins. The IVC thrombus seen previously has resolved in the interim.   NON-VASCULAR   1. No bowel obstruction or ileus. No evidence of mesenteric ischemia. 2. 3.2 cm simple left ovarian cyst. No follow-up imaging is recommended. 3. Likely reactive borderline enlarged lymph nodes along the bilateral iliac and inguinal distributions.   ASSESSMENT and PLAN   Left leg pain: No clear etiology at this time.  Duplex ultrasound is pending.  Since her discomfort goes up into her back, I wonder if she could potentially have some lower back issues.  I will review her CT scan and see if any other imaging would be necessary.  Continue oral anticoagulation.  Would  get compression stockings back on.  I will order a new pair.   Leia Alf, MD, FACS [facility] [phone] Pager [phone]

## 2021-04-01 NOTE — ED Notes (Signed)
MD notified pt requesting pain medication for her back.

## 2021-04-01 NOTE — Progress Notes (Addendum)
Subjective: Patient admitted this morning, see detailed H&P by Dr Toniann Fail 30 year old female with history of cerebral venous thrombosis, idiopathic intracranial hypertension, history of CVA who was recently admitted in May 2022 for intra cerebral thrombosis and subsequently in July for pulmonary embolism, she was noted in March 22, 2021 for abdominal pain and lower extremity swelling and was found to have occlusive thrombosis of infrarenal IVC and bilateral common right iliac, external iliac and patient underwent tPA.  After 24 hours of tPA she underwent mechanical thrombectomy with vascular surgery.  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  She subsequently developed new pulmonary embolism.  She was discharged home on Xarelto.  Patient says she developed left leg pain making it difficult to walk.  Also complained of epigastric and back pain.  CT abdomen/pelvis shows no evidence of mesenteric ischemia, persistent but improving deep vein thrombosis involving the right common iliac, right external leg and bilateral internal iliac veins.  IVC thrombus has resolved.  Venous duplex of lower extremities showed finding consistent with acute deep vein thrombosis involving the left femoral vein, left posterior tibial veins.  Vitals:   04/01/21 1030 04/01/21 1040  BP: 116/77 118/78  Pulse: (!) 109 (!) 111  Resp: 18   Temp:    SpO2: 99%       A/P Acute DVT of left lower extremity -Vascular surgery has been consulted, no further intervention recommended -I will consult hematology for further recommendations regarding anticoagulation -Continue Xarelto for now  S/p mechanical thrombectomy involving the right common iliac, right external iliac and bilateral internal iliac veins -Stable as per CTA abdomen/pelvis  Melena -FOBT is positive -Patient presented with epigastric pain -Hemoglobin is down to 9.9 this morning -consult  gastroenterology    Meredeth Ide [facility] Pager- [phone]

## 2021-04-01 NOTE — ED Notes (Signed)
Patient transported to Ultrasound 

## 2021-04-01 NOTE — Consult Note (Addendum)
Referring Provider: Dr. Sharl Ma, [facility] Primary Care Physician:  Fleet Contras, MD Primary Gastroenterologist:  Gentry Fitz  Reason for Consultation:  Drop in Hgb and hemoccult positive stool  HPI: Andrea Hayes is a 30 y.o. 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  Had been on Lovenox injections.  Then was admitted in September for abdominal pain and lower extremity swelling was found to have occlusive thrombosis of the infrarenal IVC and bilateral common iliac right external iliac and patient underwent tPA.  After 24 hours of tPA patient underwent mechanical thrombectomy with with vascular surgery.  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  Patient also found to have a new pulmonary embolism.  Patient was discharged home on Xarelto instead of her regular Lovenox.  She now presents back to the ER with worsening left anterior leg pain over the last 24 hours and reported very dark/black stools as well.  No red blood in her stools.   In the ER CT abdomen pelvis did not show any mesenteric ischemia and showed distant but improving right common iliac and right external iliac and bilateral internal iliac DVTs and IVC thrombus has resolved.  Stool for occult blood was positive.  Hemoglobin 11 grams and then today down to 9.9 grams.  LFTs are elevated and have been elevated since about July.  AST 69, ALT 62, ALP 258 and total bili normal at 00.4.  Viral hepatitis studies were negative in July and iron studies recently not elevated.  Past Medical History:  Diagnosis Date   Stroke [facility])     Past Surgical History:  Procedure Laterality Date   NO PAST SURGERIES     PERIPHERAL VASCULAR BALLOON ANGIOPLASTY  03/24/2021   Procedure: PERIPHERAL VASCULAR BALLOON ANGIOPLASTY;  Surgeon:  Leonie Douglas, MD;  Location: [facility] INVASIVE CV LAB;  Service: Cardiovascular;;  bilateral common iliacs   PERIPHERAL VASCULAR THROMBECTOMY N/A 03/23/2021   Procedure: PERIPHERAL VASCULAR THROMBECTOMY;  Surgeon: Maeola Harman, MD;  Location: [facility] INVASIVE CV LAB;  Service: Cardiovascular;  Laterality: N/A;   PERIPHERAL VASCULAR THROMBECTOMY N/A 03/24/2021   Procedure: LYSIS RECHECK;  Surgeon: Leonie Douglas, MD;  Location: [facility] INVASIVE CV LAB;  Service: Cardiovascular;  Laterality: N/A;    Prior to Admission medications   Medication Sig Start Date End Date Taking? Authorizing Provider  acetaZOLAMIDE (DIAMOX) 125 MG tablet Take 2 tablets (250 mg total) by mouth 2 (two) times daily. 12/02/20  Yes Everlena Cooper, Adam R, DO  metoprolol succinate (TOPROL-XL) 25 MG 24 hr tablet Take 1 tablet (25 mg total) by mouth daily. 03/27/21  Yes Arnetha Courser, MD  RIVAROXABAN Carlena Hurl) VTE STARTER PACK (15 & 20 MG) Follow package directions: Take one  tablet by mouth twice a day. On day 22, switch to one  tablet once a day. Take with food. 03/27/21  Yes Arnetha Courser, MD  Vitamin D, Ergocalciferol, (DRISDOL) 1.25 MG (50000 UNIT) CAPS capsule Take 50,000 Units by mouth every Wednesday. 02/24/21  Yes [provider]  acetaminophen (TYLENOL) 325 MG tablet Take 2 tablets (650 mg total) by mouth every 6 (six) hours as needed for mild pain or headache (do not take over 3000 mg in a day of tylenol. please note there is tylenol in the fiorecet). Patient not taking: No sig reported 07/29/18   Layne Benton,  NP  ferrous fumarate-b12-vitamic C-folic acid (TRINSICON / FOLTRIN) capsule Take 1 capsule by mouth 2 (two) times daily after a meal. Patient not taking: No sig reported 03/27/21   Arnetha Courser, MD  HYDROcodone-acetaminophen (NORCO/VICODIN) 5-325 MG tablet Take 1-2 tablets by mouth every 4 (four) hours as needed for moderate pain or severe pain. Patient not taking: No sig reported 03/27/21   Arnetha Courser, MD   Prenatal Vit-Fe Fumarate-FA (PRENATAL VITAMIN) 27-0.8 MG TABS Take 1 tablet by mouth daily. Patient not taking: Reported on 03/31/2021 04/19/20   Aviva Signs, CNM  rivaroxaban (XARELTO) 20 MG TABS tablet Take 1 tablet (20 mg total) by mouth daily with supper. 04/15/21   Arnetha Courser, MD    Current Facility-Administered Medications  Medication Dose Route Frequency Provider Last Rate Last Admin   acetaZOLAMIDE (DIAMOX) tablet 250 mg  250 mg Oral BID Eduard Clos, MD       metoprolol succinate (TOPROL-XL) 24 hr tablet 25 mg  25 mg Oral Daily Eduard Clos, MD   25 mg at 04/01/21 1040   oxyCODONE (Oxy IR/ROXICODONE) immediate release tablet 5 mg  5 mg Oral Q6H PRN Meredeth Ide, MD   5 mg at 04/01/21 1041   pantoprazole (PROTONIX) injection 40 mg  40 mg Intravenous Q12H Zehr, Jessica D, PA-C       Rivaroxaban (XARELTO) tablet 15 mg  15 mg Oral BID WC Eduard Clos, MD   15 mg at 04/01/21 0458   Followed by   Melene Muller ON 04/15/2021] rivaroxaban (XARELTO) tablet 20 mg  20 mg Oral Q supper Eduard Clos, MD       Current Outpatient Medications  Medication Sig Dispense Refill   acetaZOLAMIDE (DIAMOX) 125 MG tablet Take 2 tablets (250 mg total) by mouth 2 (two) times daily. 120 tablet 5   metoprolol succinate (TOPROL-XL) 25 MG 24 hr tablet Take 1 tablet (25 mg total) by mouth daily. 30 tablet 1   RIVAROXABAN (XARELTO) VTE STARTER PACK (15 & 20 MG) Follow package directions: Take one 15mg  tablet by mouth twice a day. On day 22, switch to one 20mg  tablet once a day. Take with food. 51 each 0   Vitamin D, Ergocalciferol, (DRISDOL) 1.25 MG (50000 UNIT) CAPS capsule Take 50,000 Units by mouth every Wednesday.     acetaminophen (TYLENOL) 325 MG tablet Take 2 tablets (650 mg total) by mouth every 6 (six) hours as needed for mild pain or headache (do not take over 3000 mg in a day of tylenol. please note there is tylenol in the fiorecet). (Patient not taking: No sig reported)      ferrous fumarate-b12-vitamic C-folic acid (TRINSICON / FOLTRIN) capsule Take 1 capsule by mouth 2 (two) times daily after a meal. (Patient not taking: No sig reported) 180 capsule 0   HYDROcodone-acetaminophen (NORCO/VICODIN) 5-325 MG tablet Take 1-2 tablets by mouth every 4 (four) hours as needed for moderate pain or severe pain. (Patient not taking: No sig reported) 30 tablet 0   Prenatal Vit-Fe Fumarate-FA (PRENATAL VITAMIN) 27-0.8 MG TABS Take 1 tablet by mouth daily. (Patient not taking: Reported on 03/31/2021) 30 tablet 12   [START ON 04/15/2021] rivaroxaban (XARELTO) 20 MG TABS tablet Take 1 tablet (20 mg total) by mouth daily with supper. 30 tablet 6    Allergies as of 03/31/2021   (No Known Allergies)    Family History  Problem Relation Age of Onset   Asthma Father    Obesity Neg Hx  Hypertension Neg Hx    Diabetes Neg Hx    Cancer Neg Hx     Social History   Socioeconomic History   Marital status: Single    Spouse name: Not on file   Number of children: Not on file   Years of education: Not on file   Highest education level: Not on file  Occupational History   Not on file  Tobacco Use   Smoking status: Never   Smokeless tobacco: Never  Vaping Use   Vaping Use: Never used  Substance and Sexual Activity   Alcohol use: No    Alcohol/week: 0.0 standard drinks   Drug use: No   Sexual activity: Yes    Birth control/protection: None  Other Topics Concern   Not on file  Social History Narrative   Right handed   Social Determinants of Health   Financial Resource Strain: Not on file  Food Insecurity: Not on file  Transportation Needs: Not on file  Physical Activity: Not on file  Stress: Not on file  Social Connections: Not on file  Intimate Partner Violence: Not on file   Review of Systems: ROS is O/W negative except as mentioned in HPI.  Physical Exam: Vital signs in last 24 hours: Temp:  [98.2 F (36.8 C)-99.2 F (37.3 C)] 98.2 F (36.8 C) (10/01  0600) Pulse Rate:  [94-111] 105 (10/01 1430) Resp:  [15-37] 15 (10/01 1430) BP: (94-135)/(70-96) 110/93 (10/01 1400) SpO2:  [97 %-100 %] 99 % (10/01 1430) Weight:  [76.4 kg] 76.4 kg (09/30 1856)   General:  Alert, Well-developed, well-nourished, pleasant and cooperative in NAD Head:  Normocephalic and atraumatic. Eyes:  Sclera clear, no icterus.  Conjunctiva pink. Ears:  Normal auditory acuity. Mouth:  No deformity or lesions.   Lungs:  Clear throughout to auscultation.  No wheezes, crackles, or rhonchi.  Heart:  Tachy; no murmurs, clicks, rubs, or gallops. Abdomen:  Soft, non-distended.  BS present.  Non-tender. Rectal:  Deferred  Msk:  Symmetrical without gross deformities. Pulses:  Normal pulses noted. Extremities:  Without clubbing or edema. Neurologic:  Alert and oriented x 4;  grossly normal neurologically. Skin:  Intact without significant lesions or rashes. Psych:  Alert and cooperative. Normal mood and affect.  Lab Results: Recent Labs    03/31/21 2034 04/01/21 0502  WBC 14.1* 13.6*  HGB 11.0* 9.9*  HCT 35.3* 31.4*  PLT 304 283   BMET Recent Labs    03/31/21 2034 04/01/21 0502  NA 131* 130*  K 3.9 3.3*  CL 101 101  CO2 18* 18*  GLUCOSE 105* 117*  BUN 9 10  CREATININE 0.65 0.74  CALCIUM 9.5 8.9   LFT Recent Labs    04/01/21 0502  PROT 7.9  ALBUMIN 2.5*  AST 69*  ALT 62*  ALKPHOS 258*  BILITOT 0.4   Studies/Results: VAS [place] LOWER EXTREMITY VENOUS (DVT)  Result Date: 04/01/2021  Lower Venous DVT Study Patient Name:  MILLEE DENISE  Date of Exam:   04/01/2021 Medical Rec #: [id number]       Accession #:    [id number] Date of Birth: 03-14-1991       Patient Gender: F Patient Age:   29 years Exam Location:  [facility] Procedure:      VAS [place] LOWER EXTREMITY VENOUS (DVT) Referring Phys: Midge Minium --------------------------------------------------------------------------------  Indications: Pain, and Swelling.  Risk Factors: Status post TPA  and mechanical thrombectomy 03/22/21, for occlusive DVT in the IVC, bilateral iliac veins. Limitations: Patient  unable to tolerate compression. Bandage behind knee, patient refused to let me remove it. Comparison Study: Prior negative bilateral lower extremity venous duplex done                   01/23/21 Performing Technologist: Sherren Kerns RVS  Examination Guidelines: A complete evaluation includes B-mode imaging, spectral Doppler, color Doppler, and power Doppler as needed of all accessible portions of each vessel. Bilateral testing is considered an integral part of a complete examination. Limited examinations for reoccurring indications may be performed as noted. The reflux portion of the exam is performed with the patient in reverse Trendelenburg.  +-------+---------------+---------+-----------+----------+-----------------+ RIGHT  CompressibilityPhasicitySpontaneityPropertiesThrombus Aging    +-------+---------------+---------+-----------+----------+-----------------+ CFV    Partial        No       No                   Age Indeterminate +-------+---------------+---------+-----------+----------+-----------------+ FV ProxPartial        No       No                                     +-------+---------------+---------+-----------+----------+-----------------+ PFV    Partial        No       No                                     +-------+---------------+---------+-----------+----------+-----------------+   +---------+---------------+---------+-----------+----------+-------------------+ LEFT     CompressibilityPhasicitySpontaneityPropertiesThrombus Aging      +---------+---------------+---------+-----------+----------+-------------------+ CFV      Full           Yes      Yes                                      +---------+---------------+---------+-----------+----------+-------------------+ SFJ      Full                                                              +---------+---------------+---------+-----------+----------+-------------------+ FV Prox                 No       No                   Acute               +---------+---------------+---------+-----------+----------+-------------------+ FV Mid                  No       No                   Acute               +---------+---------------+---------+-----------+----------+-------------------+ FV Distal               No       No                   Acute               +---------+---------------+---------+-----------+----------+-------------------+ PFV  Yes      Yes                  patent by color and                                                       Doppler             +---------+---------------+---------+-----------+----------+-------------------+ POP                                                   unable to visualize                                                       secondary to                                                              bandage             +---------+---------------+---------+-----------+----------+-------------------+ PTV      None           No       No                   Acute               +---------+---------------+---------+-----------+----------+-------------------+ PERO                                                  Not well visualized +---------+---------------+---------+-----------+----------+-------------------+    Summary: RIGHT: - Findings consistent with age indeterminate deep vein thrombosis involving the right common femoral vein, right femoral vein, and right proximal profunda vein. - Ultrasound characteristics of enlarged lymph nodes are noted in the groin.  LEFT: - Findings consistent with acute deep vein thrombosis involving the left femoral vein, and left posterior tibial veins. - Ultrasound characteristics of enlarged lymph nodes noted in the groin.  *See table(s) above for  measurements and observations.    Preliminary    CT Angio Abd/Pel W and/or Wo Contrast  Result Date: 03/31/2021 CLINICAL DATA:  Acute mesenteric ischemia, history of IVC thrombosis and DVT EXAM: CTA ABDOMEN AND PELVIS WITHOUT AND WITH CONTRAST TECHNIQUE: Multidetector CT imaging of the abdomen and pelvis was performed using the standard protocol during bolus administration of intravenous contrast. Multiplanar reconstructed images and MIPs were obtained and reviewed to evaluate the vascular anatomy. CONTRAST:  75mL OMNIPAQUE IOHEXOL 350 MG/ML SOLN COMPARISON:  03/22/2021 FINDINGS: VASCULAR Aorta: Normal caliber aorta without aneurysm, dissection, vasculitis or significant stenosis. Celiac: Patent without evidence of aneurysm, dissection, vasculitis or significant stenosis. SMA: Patent without evidence of aneurysm, dissection, vasculitis or significant stenosis. Renals: Both renal arteries are patent  without evidence of aneurysm, dissection, vasculitis, fibromuscular dysplasia or significant stenosis. IMA: Patent without evidence of aneurysm, dissection, vasculitis or significant stenosis. Inflow: Patent without evidence of aneurysm, dissection, vasculitis or significant stenosis. Proximal Outflow: Bilateral common femoral and visualized portions of the superficial and profunda femoral arteries are patent without evidence of aneurysm, dissection, vasculitis or significant stenosis. 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 There is continued acute DVT involving the right common iliac, right external iliac, and bilateral internal iliac veins. Overall decreased clot burden since prior study. Review of the MIP images confirms the above findings. NON-VASCULAR Lower chest: No acute pleural or parenchymal lung disease. Hepatobiliary: No focal liver abnormality is seen. No gallstones, gallbladder wall thickening, or biliary dilatation. Pancreas: Unremarkable. No pancreatic ductal dilatation or  surrounding inflammatory changes. Spleen: Normal in size without focal abnormality. Adrenals/Urinary Tract: Adrenal glands are unremarkable. Kidneys are normal, without renal calculi, focal lesion, or hydronephrosis. Bladder is unremarkable. Stomach/Bowel: No bowel obstruction or ileus. Normal appendix right lower quadrant. No bowel wall thickening or inflammatory change. No evidence of mesenteric ischemia. Lymphatic: Stable borderline enlarged bilateral iliac chain lymph nodes may be reactive. No other pathologic adenopathy. Reproductive: 3.2 cm simple appearing left ovarian cyst. Right ovary and uterus are unremarkable. Other: Decreased presacral edema consistent with improving DVT. No free intraperitoneal fluid or free gas. Musculoskeletal: There are no acute or destructive bony lesions. 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 Reconstructed images demonstrate no additional findings. IMPRESSION: VASCULAR 1. No evidence of mesenteric ischemia. Arterial structures are widely patent. 2. Persistent but improving deep venous thrombosis involving the right common iliac, right external iliac, and bilateral internal iliac veins. The IVC thrombus seen previously has resolved in the interim. NON-VASCULAR 1. No bowel obstruction or ileus. No evidence of mesenteric ischemia. 2. 3.2 cm simple left ovarian cyst. No follow-up imaging is recommended. 3. Likely reactive borderline enlarged lymph nodes along the bilateral iliac and inguinal distributions. Electronically Signed   By: Sharlet Salina M.D.   On: 03/31/2021 23:05    IMPRESSION:  -Drop in Hgb with heme positive, dark/black stools:  Reports dark/black stools since her recent hospital discharge when she was placed on Xarelto.  Hgb only down about a gram but she was heme positive as well. -Elevated LFTs:  It appears that they starting going up in July.  Imaging has suggested some hetergeneous parenchymal echogenicity.  Viral  hepatitis studies were negative at that point.  Iron studies not elevated recently. -Acute LLE DVT this admission with recent mechanical thrombectomy on the right:  Was discharged on Xarelto. -PEs in July and then a new one again in September  PLAN: -Pantoprazole 40 mg IV BID for now. -Monitor Hgb. -Possible EGD this admission, maybe Monday pending her clinical course. -Trend LFTs.  Will order usual liver serologies to rule out other causes of elevated LFTs. -Agree with vascular and hematology consults.  Princella Pellegrini. Zehr  04/01/2021, 2:51 PM   I have taken a history, reviewed the chart and examined the patient. I performed a substantive portion of this encounter, including complete performance of at least one of the key components, in conjunction with the APP. I agree with the APP's note, impression and recommendations, with the only change is that I recommend PO PPI as opposed to IV.  30 year old female with history of thrombophilia (although no specific diagnosis identified) with numerous venous thrombotic complications in the past two years) on  chronic anticoagulation, admitted with worsening leg pain of unclear etiology, found to have a slight drop in her hemoglobin (9.9 from baseline 10-11) and questionable dark appearing stools with a positive FOBT on admission, BUN 9-10.  It is unclear at this time if the patient is truly having a GI bleed.  If she is, it is not likely from a high risk lesion.  I recommend observation at this time, monitoring her hgb and stools, treating empirically with BID PPI.  She also has elevated liver enzymes since July, around the time she was also noted to have an IVC clot extending into the hepatic space.  I suspect that this is the cause of the liver enzyme elevation, but we should exclude other causes (autoimmune, metabolic) given the persistent elevation.    Positive FOBT - Monitor CBCs/BUN, stools - Protonix 40 mg PO BID for now - Okay to have regular diet  from GI standpoint - Potential endoscopic examination during this hospitalization if further evidence of GI bleed  Elevated liver enzymes (mixed pattern), suspect related to hepatic extension of IVC clot - ASMA, AMA, ceruloplasmin, A1AT Viral etiologies and hemochromatosis eval negative already   Thaine Garriga E. Tomasa Rand, MD [facility] Gastroenter[facility]

## 2021-04-01 NOTE — Progress Notes (Signed)
VASCULAR LAB    Left lower extremity venous duplex has been performed.  See CV proc for preliminary results.  Messaged results to Dr. Sharl Ma via secure chat.  Dearius Hoffmann, RVT 04/01/2021, 7:58 AM

## 2021-04-01 NOTE — Plan of Care (Signed)
  Problem: Education: Goal: Knowledge of General Education information will improve Description: Including pain rating scale, medication(s)/side effects and non-pharmacologic comfort measures 04/01/2021 1657 by Sinclair Grooms, RN Outcome: Progressing 04/01/2021 1657 by Sinclair Grooms, RN Outcome: Progressing

## 2021-04-02 DIAGNOSIS — Z79899 Other long term (current) drug therapy: Secondary | ICD-10-CM | POA: Diagnosis not present

## 2021-04-02 DIAGNOSIS — R1084 Generalized abdominal pain: Secondary | ICD-10-CM | POA: Diagnosis not present

## 2021-04-02 DIAGNOSIS — I10 Essential (primary) hypertension: Secondary | ICD-10-CM | POA: Diagnosis not present

## 2021-04-02 DIAGNOSIS — Z8673 Personal history of transient ischemic attack (TIA), and cerebral infarction without residual deficits: Secondary | ICD-10-CM | POA: Diagnosis not present

## 2021-04-02 DIAGNOSIS — Z86711 Personal history of pulmonary embolism: Secondary | ICD-10-CM | POA: Diagnosis not present

## 2021-04-02 DIAGNOSIS — K648 Other hemorrhoids: Secondary | ICD-10-CM | POA: Diagnosis present

## 2021-04-02 DIAGNOSIS — Z955 Presence of coronary angioplasty implant and graft: Secondary | ICD-10-CM | POA: Diagnosis not present

## 2021-04-02 DIAGNOSIS — G932 Benign intracranial hypertension: Secondary | ICD-10-CM | POA: Diagnosis not present

## 2021-04-02 DIAGNOSIS — M79605 Pain in left leg: Secondary | ICD-10-CM | POA: Diagnosis not present

## 2021-04-02 DIAGNOSIS — D509 Iron deficiency anemia, unspecified: Secondary | ICD-10-CM | POA: Diagnosis present

## 2021-04-02 DIAGNOSIS — R748 Abnormal levels of other serum enzymes: Secondary | ICD-10-CM | POA: Diagnosis not present

## 2021-04-02 DIAGNOSIS — Z86718 Personal history of other venous thrombosis and embolism: Secondary | ICD-10-CM | POA: Diagnosis not present

## 2021-04-02 DIAGNOSIS — Z91128 Patient's intentional underdosing of medication regimen for other reason: Secondary | ICD-10-CM | POA: Diagnosis not present

## 2021-04-02 DIAGNOSIS — Z20822 Contact with and (suspected) exposure to covid-19: Secondary | ICD-10-CM | POA: Diagnosis not present

## 2021-04-02 DIAGNOSIS — K921 Melena: Secondary | ICD-10-CM | POA: Diagnosis present

## 2021-04-02 DIAGNOSIS — Z7901 Long term (current) use of anticoagulants: Secondary | ICD-10-CM | POA: Diagnosis not present

## 2021-04-02 DIAGNOSIS — I82409 Acute embolism and thrombosis of unspecified deep veins of unspecified lower extremity: Secondary | ICD-10-CM

## 2021-04-02 DIAGNOSIS — I82442 Acute embolism and thrombosis of left tibial vein: Secondary | ICD-10-CM | POA: Diagnosis not present

## 2021-04-02 DIAGNOSIS — I82413 Acute embolism and thrombosis of femoral vein, bilateral: Secondary | ICD-10-CM | POA: Diagnosis not present

## 2021-04-02 DIAGNOSIS — R195 Other fecal abnormalities: Secondary | ICD-10-CM | POA: Diagnosis not present

## 2021-04-02 DIAGNOSIS — E871 Hypo-osmolality and hyponatremia: Secondary | ICD-10-CM | POA: Diagnosis not present

## 2021-04-02 LAB — APTT: aPTT: 40 seconds — ABNORMAL HIGH (ref 24–36)

## 2021-04-02 LAB — HEPARIN LEVEL (UNFRACTIONATED): Heparin Unfractionated: >1.1 IU/mL — ABNORMAL HIGH (ref 0.30–0.70)

## 2021-04-02 MED ORDER — HEPARIN (PORCINE) 25000 UT/250ML-% IV SOLN
1600.0000 [IU]/h | INTRAVENOUS | Status: DC
  Administered 2021-04-02: 1200 [IU]/h via INTRAVENOUS
  Administered 2021-04-03: 1550 [IU]/h via INTRAVENOUS
  Administered 2021-04-04 – 2021-04-06 (×5): 1600 [IU]/h via INTRAVENOUS
  Filled 2021-04-02 (×9): qty 250

## 2021-04-02 MED ORDER — DOCUSATE SODIUM 100 MG PO CAPS
100.0000 mg | ORAL_CAPSULE | Freq: Every day | ORAL | Status: DC
  Administered 2021-04-02 – 2021-04-07 (×4): 100 mg via ORAL
  Filled 2021-04-02 (×7): qty 1

## 2021-04-02 MED ORDER — POLYETHYLENE GLYCOL 3350 17 G PO PACK
17.0000 g | PACK | Freq: Every day | ORAL | Status: DC
  Administered 2021-04-02 – 2021-04-08 (×6): 17 g via ORAL
  Filled 2021-04-02 (×6): qty 1

## 2021-04-02 MED ORDER — POTASSIUM CHLORIDE CRYS ER 20 MEQ PO TBCR
40.0000 meq | EXTENDED_RELEASE_TABLET | Freq: Once | ORAL | Status: AC
  Administered 2021-04-02: 40 meq via ORAL
  Filled 2021-04-02: qty 2

## 2021-04-02 NOTE — Progress Notes (Signed)
[facility] ROUNDING NOTE   Subjective: No acute events overnight.  Patient's leg pain improving a little.  Still having vague abdominal discomfort and decreased appetite.  She has not had a bowel movement since she was admitted.  Repeat CBC not done this morning.  Anticoagulation to be switched to coumadin after starting heparin gtt today.    Objective: Vital signs in last 24 hours: Temp:  [98.3 F (36.8 C)-98.9 F (37.2 C)] 98.7 F (37.1 C) (10/02 0900) Pulse Rate:  [94-113] 106 (10/02 0900) Resp:  [15-37] 17 (10/02 0900) BP: (94-120)/(63-93) 105/69 (10/02 0900) SpO2:  [97 %-100 %] 100 % (10/02 0900) Last BM Date: 03/31/21 (per patient, she had BM before she arrived to the hospital) General: NAD Lungs: CTA b/l, no w/r/r Heart: RRR, no m/r/g Abdomen: Soft, NT, ND, +BS Ext:  Compression stockings in place, not removed for my examination    Intake/Output from previous day: 10/01 0701 - 10/02 0700 In: 479 [P.O.:479] Out: -  Intake/Output this shift: No intake/output data recorded.   Lab Results: Recent Labs    03/31/21 2034 04/01/21 0502  WBC 14.1* 13.6*  HGB 11.0* 9.9*  PLT 304 283  MCV 85.3 84.6   BMET Recent Labs    03/31/21 2034 04/01/21 0502  NA 131* 130*  K 3.9 3.3*  CL 101 101  CO2 18* 18*  GLUCOSE 105* 117*  BUN 9 10  CREATININE 0.65 0.74  CALCIUM 9.5 8.9   LFT Recent Labs    03/31/21 2034 04/01/21 0502  PROT 8.3* 7.9  ALBUMIN 2.7* 2.5*  AST 141* 69*  ALT 80* 62*  ALKPHOS 263* 258*  BILITOT 0.3 0.4   PT/INR No results for input(s): INR in the last 72 hours.    Imaging/Other results: VAS [place] LOWER EXTREMITY VENOUS (DVT)  Result Date: 04/02/2021  Lower Venous DVT Study Patient Name:  Andrea Hayes  Date of Exam:   04/01/2021 Medical Rec #: [id number]       Accession #:    [id number] Date of Birth: 1991/06/08       Patient Gender: F Patient Age:   29 years Exam Location:  [facility] Procedure:      VAS [place] LOWER  EXTREMITY VENOUS (DVT) Referring Phys: Midge Minium --------------------------------------------------------------------------------  Indications: Pain, and Swelling.  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 Limitations: Patient unable to tolerate compression. Bandage behind knee, patient refused to let me remove it. Comparison Study: Prior negative bilateral lower extremity venous duplex done                   01/23/21 Performing Technologist: Sherren Kerns RVS  Examination Guidelines: A complete evaluation includes B-mode imaging, spectral Doppler, color Doppler, and power Doppler as needed of all accessible portions of each vessel. Bilateral testing is considered an integral part of a complete examination. Limited examinations for reoccurring indications may be performed as noted. The reflux portion of the exam is performed with the patient in reverse Trendelenburg.  +-------+---------------+---------+-----------+----------+-----------------+ RIGHT  CompressibilityPhasicitySpontaneityPropertiesThrombus Aging    +-------+---------------+---------+-----------+----------+-----------------+ CFV    Partial        No       No                   Age Indeterminate +-------+---------------+---------+-----------+----------+-----------------+ FV ProxPartial        No       No                                     +-------+---------------+---------+-----------+----------+-----------------+  PFV    Partial        No       No                                     +-------+---------------+---------+-----------+----------+-----------------+   +---------+---------------+---------+-----------+----------+-------------------+ LEFT     CompressibilityPhasicitySpontaneityPropertiesThrombus Aging      +---------+---------------+---------+-----------+----------+-------------------+ CFV      Full           Yes      Yes                                       +---------+---------------+---------+-----------+----------+-------------------+ SFJ      Full                                                             +---------+---------------+---------+-----------+----------+-------------------+ FV Prox                 No       No                   Acute               +---------+---------------+---------+-----------+----------+-------------------+ FV Mid                  No       No                   Acute               +---------+---------------+---------+-----------+----------+-------------------+ FV Distal               No       No                   Acute               +---------+---------------+---------+-----------+----------+-------------------+ PFV                     Yes      Yes                  patent by color and                                                       Doppler             +---------+---------------+---------+-----------+----------+-------------------+ POP                                                   unable to visualize  secondary to                                                              bandage             +---------+---------------+---------+-----------+----------+-------------------+ PTV      None           No       No                   Acute               +---------+---------------+---------+-----------+----------+-------------------+ PERO                                                  Not well visualized +---------+---------------+---------+-----------+----------+-------------------+     Summary: RIGHT: - Findings consistent with age indeterminate deep vein thrombosis involving the right common femoral vein, right femoral vein, and right proximal profunda vein. - Ultrasound characteristics of enlarged lymph nodes are noted in the groin.  LEFT: - Findings consistent with  acute deep vein thrombosis involving the left femoral vein, and left posterior tibial veins. - Ultrasound characteristics of enlarged lymph nodes noted in the groin.  *See table(s) above for measurements and observations. Electronically signed by Coral Else MD on 04/02/2021 at 9:31:45 AM.    Final    CT Angio Abd/Pel W and/or Wo Contrast  Result Date: 03/31/2021 CLINICAL DATA:  Acute mesenteric ischemia, history of IVC thrombosis and DVT EXAM: CTA ABDOMEN AND PELVIS WITHOUT AND WITH CONTRAST TECHNIQUE: Multidetector CT imaging of the abdomen and pelvis was performed using the standard protocol during bolus administration of intravenous contrast. Multiplanar reconstructed images and MIPs were obtained and reviewed to evaluate the vascular anatomy. CONTRAST:  57mL OMNIPAQUE IOHEXOL 350 MG/ML SOLN COMPARISON:  03/22/2021 FINDINGS: VASCULAR Aorta: Normal caliber aorta without aneurysm, dissection, vasculitis or significant stenosis. Celiac: Patent without evidence of aneurysm, dissection, vasculitis or significant stenosis. SMA: Patent without evidence of aneurysm, dissection, vasculitis or significant stenosis. Renals: Both renal arteries are patent without evidence of aneurysm, dissection, vasculitis, fibromuscular dysplasia or significant stenosis. IMA: Patent without evidence of aneurysm, dissection, vasculitis or significant stenosis. Inflow: Patent without evidence of aneurysm, dissection, vasculitis or significant stenosis. Proximal Outflow: Bilateral common femoral and visualized portions of the superficial and profunda femoral arteries are patent without evidence of aneurysm, dissection, vasculitis or significant stenosis. 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 There is continued acute DVT involving the right common iliac, right external iliac, and bilateral internal iliac veins. Overall decreased clot burden since prior study. Review of the MIP images confirms the above  findings. NON-VASCULAR Lower chest: No acute pleural or parenchymal lung disease. Hepatobiliary: No focal liver abnormality is seen. No gallstones, gallbladder wall thickening, or biliary dilatation. Pancreas: Unremarkable. No pancreatic ductal dilatation or surrounding inflammatory changes. Spleen: Normal in size without focal abnormality. Adrenals/Urinary Tract: Adrenal glands are unremarkable. Kidneys are normal, without renal calculi, focal lesion, or hydronephrosis. Bladder is unremarkable. Stomach/Bowel: No bowel obstruction or ileus. Normal appendix right lower quadrant. No bowel wall thickening or inflammatory change. No evidence of mesenteric ischemia. Lymphatic: Stable  borderline enlarged bilateral iliac chain lymph nodes may be reactive. No other pathologic adenopathy. Reproductive: 3.2 cm simple appearing left ovarian cyst. Right ovary and uterus are unremarkable. Other: Decreased presacral edema consistent with improving DVT. No free intraperitoneal fluid or free gas. Musculoskeletal: There are no acute or destructive bony lesions. 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 Reconstructed images demonstrate no additional findings. IMPRESSION: VASCULAR 1. No evidence of mesenteric ischemia. Arterial structures are widely patent. 2. Persistent but improving deep venous thrombosis involving the right common iliac, right external iliac, and bilateral internal iliac veins. The IVC thrombus seen previously has resolved in the interim. NON-VASCULAR 1. No bowel obstruction or ileus. No evidence of mesenteric ischemia. 2. 3.2 cm simple left ovarian cyst. No follow-up imaging is recommended. 3. Likely reactive borderline enlarged lymph nodes along the bilateral iliac and inguinal distributions. Electronically Signed   By: Sharlet Salina M.D.   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  She has not had any evidence of GI bleeding this admission.  No CBC today to determine if hgb stable or downtrending.  Overall, my suspicion for a clinically significant GI bleed is very low at this point and so plans for an urgent endoscopy currently.  Will continue to follow peripherally and trend CBCs. She will need outpatient follow up for her elevated liver enzymes, as the etiology is still unclear at this point.    Positive FOBT - No evidence of active GI bleed currently - Trend CBC today - Reassess need for endoscopy tomorrow  Elevated LAEs, mixed pattern, stable - Work up pending  Abdominal discomfort - Patient would likely benefit from bowel regimen while on oral narcotics - Will start miralax daily + colace qpm     Jenel Lucks, MD  04/02/2021, 11:04 AM [facility] Pager [phone]

## 2021-04-02 NOTE — Plan of Care (Signed)

## 2021-04-02 NOTE — Progress Notes (Signed)
ANTICOAGULATION CONSULT NOTE - Initial Consult  Pharmacy Consult for Heparin Indication: DVT  No Known Allergies  Patient Measurements: Height: 5\' 5"  (165.1 cm) Weight: 76.4 kg (168 lb 6.9 oz) IBW/kg (Calculated) : 57 Heparin Dosing Weight: 76.4 kg  Vital Signs: Temp: 98.7 F (37.1 C) (10/02 0900) Temp Source: Oral (10/02 0417) BP: 105/69 (10/02 0900) Pulse Rate: 106 (10/02 0900)  Labs: Recent Labs    03/31/21 2034 04/01/21 0502  HGB 11.0* 9.9*  HCT 35.3* 31.4*  PLT 304 283  CREATININE 0.65 0.74    Estimated Creatinine Clearance: 105.2 mL/min (by C-G formula based on SCr of 0.74 mg/dL).   Medical History: Past Medical History:  Diagnosis Date   Stroke [facility])     Medications:  Scheduled:   acetaZOLAMIDE  250 mg Oral BID   metoprolol succinate  25 mg Oral Daily   pantoprazole  40 mg Oral BID    Assessment: Andrea Hayes is a 30 year old female with a diagnosed age-indeterminate thrombus in the right common femoral, femoral and profunda vein.  On the left there is acute DVT in the femoral vein and posterior tibial vein. Of note, she had a recent PE and hepatic vein thrombus and was started on xarelto.Started Xarelto 15mg  BID on 9/24, to change to 20mg  BID on 10/15. Continued on admission.   Her last dose of xarelto was 10/2 @ 0932. Will hold off on starting heparin until at least 12 hours from xarelto dose. Hematology following.  Planning to start heparin gtt and transition to warfarin in the coming days.  Hgb 9.9, Plt WNL   Goal of Therapy:  Heparin level 0.3-0.7 units/ml Monitor platelets by anticoagulation protocol: Yes   Plan:  Hold starting heparin until until 2200 on 10/2 Will start 12 hours from last xarelto dose Baseline aptt and Heparin level ordered Will start heparin at 1200 units/hr (16 units/kg/h)  no bolus given Target HL (0.3-0.7) aptt (66-102) Will monitor for correlation between the aptt and HL Monitor daily CBC, aptt, HL, and s/s of  bleeding -Heparin level in 6 hours from start of infusion   11/15, PharmD PGY2 Infectious Diseases Pharmacy Resident   Please check [email or web address] for unit-specific pharmacy phone numbers

## 2021-04-02 NOTE — Progress Notes (Signed)
   04/02/21 0417  Assess: MEWS Score  Temp 98.9 F (37.2 C)  BP 105/63  Pulse Rate (!) 113  SpO2 100 %  O2 Device Room Air  Assess: MEWS Score  MEWS Temp 0  MEWS Systolic 0  MEWS Pulse 2  MEWS RR 0  MEWS LOC 0  MEWS Score 2  MEWS Score Color Yellow  Assess: if the MEWS score is Yellow or Red  Were vital signs taken at a resting state? Yes  Focused Assessment Change from prior assessment (see assessment flowsheet)  Early Detection of Sepsis Score *See Row Information* Low  MEWS guidelines implemented *See Row Information* Yes  Treat  Pain Scale 0-10  Take Vital Signs  Increase Vital Sign Frequency  Yellow: Q 2hr X 2 then Q 4hr X 2, if remains yellow, continue Q 4hrs  Escalate  MEWS: Escalate Yellow: discuss with charge nurse/RN and consider discussing with provider and RRT  Notify: Charge Nurse/RN  Name of Charge Nurse/RN Notified [place], Rn  Date Charge Nurse/RN Notified 04/02/21  Time Charge Nurse/RN Notified 0435  Notify: Provider  Provider Name/Title Linton Flemings  Date Provider Notified 04/02/21  Time Provider Notified [phone]  Notification Type Page  Notification Reason Change in status  Provider response Evaluate remotely  Document  Progress note created (see row info) Yes

## 2021-04-02 NOTE — Progress Notes (Signed)
    Subjective  -   No acute issues overnight. Still with left groin and back pain   Physical Exam:  Compression stockings in place bilaterally.  No traumatic edema in her legs.  Popliteal cannulation site is soft and nontender    Venous duplex shows age-indeterminate thrombus in the right common femoral, femoral and profunda vein.  On the left there is acute DVT in the femoral vein and posterior tibial vein.  She does have enlarged lymph nodes bilaterally   Assessment/Plan:    The patient was admitted for left leg pain and groin pain.  I have reviewed her ultrasound.  I doubt that her DVT is the culprit for her pain.  I do not think that the acute thrombus visualized represents a failure of anticoagulation.  I would continue her current medication regimen.  No further intervention is recommended for the DVT other than compression and anticoagulation.  I suspect she will improve with time.  Wells Gabrielly Mccrystal 04/02/2021 9:18 AM --  Vitals:   04/02/21 0630 04/02/21 0900  BP: 106/65 105/69  Pulse: (!) 108 (!) 106  Resp:  17  Temp: 98.9 F (37.2 C) 98.7 F (37.1 C)  SpO2: 100% 100%    Intake/Output Summary (Last 24 hours) at 04/02/2021 0918 Last data filed at 04/01/2021 2059 Gross per 24 hour  Intake 479 ml  Output --  Net 479 ml     Laboratory CBC    Component Value Date/Time   WBC 13.6 (H) 04/01/2021 0502   HGB 9.9 (L) 04/01/2021 0502   HGB 13.6 10/31/2020 1343   HCT 31.4 (L) 04/01/2021 0502   HCT 40.7 10/31/2020 1343   PLT 283 04/01/2021 0502   PLT 148 (L) 10/31/2020 1343    BMET    Component Value Date/Time   NA 130 (L) 04/01/2021 0502   K 3.3 (L) 04/01/2021 0502   CL 101 04/01/2021 0502   CO2 18 (L) 04/01/2021 0502   GLUCOSE 117 (H) 04/01/2021 0502   BUN 10 04/01/2021 0502   CREATININE 0.74 04/01/2021 0502   CALCIUM 8.9 04/01/2021 0502   GFRNONAA >60 04/01/2021 0502   GFRAA >60 01/27/2020 0737    COAG Lab Results  Component Value Date   INR 1.0  03/22/2021   INR 1.7 (H) 01/26/2021   INR 1.2 01/25/2021   No results found for: PTT  Antibiotics Anti-infectives (From admission, onward)    None        V. Charlena Cross, M.D., [facility] [place] Office: [phone] Pager:  [phone]

## 2021-04-02 NOTE — Progress Notes (Signed)
[facility]  PROGRESS NOTE  Brayley Geller XBJ:[id number] DOB: 1990/11/11 DOA: 03/31/2021 PCP: Fleet Contras, MD   Brief HPI:    Patient admitted this morning, see detailed H&P by Dr Toniann Fail 30 year old female with history of cerebral venous thrombosis, idiopathic intracranial hypertension, history of CVA who was recently admitted in May 2022 for intra cerebral thrombosis and subsequently in July for pulmonary embolism, she was noted in March 22, 2021 for abdominal pain and lower extremity swelling and was found to have occlusive thrombosis of infrarenal IVC and bilateral common right iliac, external iliac and patient underwent tPA.  After 24 hours of tPA she underwent mechanical thrombectomy with vascular surgery.  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  She subsequently developed new pulmonary embolism.  She was discharged home on Xarelto.  Patient says she developed left leg pain making it difficult to walk.  Also complained of epigastric and back pain.   CT abdomen/pelvis shows no evidence of mesenteric ischemia, persistent but improving deep vein thrombosis involving the right common iliac, right external leg and bilateral internal iliac veins.  IVC thrombus has resolved.   Venous duplex of lower extremities showed finding consistent with acute deep vein thrombosis involving the left femoral vein, left posterior tibial veins.     Subjective   Patient seen and examined, still has leg pain.  Started on IV heparin gtt today   Assessment/Plan:     Acute DVT of left lower extremity -Patient has extensive history of venous thromboembolism -Presented with left leg pain -Venous duplex of lower extremity showed deep vein thrombosis Moding left femoral vein, left posterior tibial veins -S/p mechanical thrombectomy involving right common iliac, right external iliac and bilateral internal iliac veins -CT abdomen/pelvis showed  no evidence of mesenteric ischemia, persistent but improved amlodipine thrombosis involving right common iliac, right external leg and bilateral internal iliac veins.  IVC thrombus has resolved. -Vascular surgery was consulted; no further intervention recommended -Also consulted hematology/oncology; anticoagulation switched from Xarelto to IV heparin gtt. we will start warfarin, goal of INR between 2.5-3.5 -Patient to follow-up with hematology/oncology, Dr. Mosetta Putt as outpatient   Melena -FOBT was positive -Patient also had epigastric pain -Hemoglobin had dropped to 9.9 -GI was consulted, no plan for EGD at this time -Continue pantoprazole -Follow CBC in a.m.  Hypertension -Continue metoprolol  Idiopathic intracranial hypertension -Continue acetazolamide   History of cerebral vein thrombosis, pulmonary embolism -On anticoagulation as above     Scheduled medications:    acetaZOLAMIDE  250 mg Oral BID   docusate sodium  100 mg Oral QHS   metoprolol succinate  25 mg Oral Daily   pantoprazole  40 mg Oral BID   polyethylene glycol  17 g Oral Daily     Data Reviewed:   CBG:  Recent Labs  Lab 03/26/21 1610 03/26/21 1948 03/27/21 0629 03/27/21 1110  GLUCAP 92 88 75 91    SpO2: 100 %    Vitals:   04/02/21 0003 04/02/21 0417 04/02/21 0630 04/02/21 0900  BP: 114/70 105/63 106/65 105/69  Pulse: (!) 110 (!) 113 (!) 108 (!) 106  Resp:    17  Temp: 98.3 F (36.8 C) 98.9 F (37.2 C) 98.9 F (37.2 C) 98.7 F (37.1 C)  TempSrc: Oral Oral    SpO2: 99% 100% 100% 100%  Weight:      Height:         Intake/Output Summary (Last 24 hours) at 04/02/2021 1313  Last data filed at 04/01/2021 2059 Gross per 24 hour  Intake 479 ml  Output --  Net 479 ml    09/30 1901 - 10/02 0700 In: 479 [P.O.:479] Out: -   Filed Weights   03/31/21 1856  Weight: 76.4 kg    Data Reviewed: Basic Metabolic Panel: Recent Labs  Lab 03/27/21 0124 03/31/21 2034 04/01/21 0502  NA 135  131* 130*  K 4.0 3.9 3.3*  CL 99 101 101  CO2 25 18* 18*  GLUCOSE 100* 105* 117*  BUN <5* 9 10  CREATININE 0.57 0.65 0.74  CALCIUM 8.9 9.5 8.9   Liver Function Tests: Recent Labs  Lab 03/27/21 0124 03/31/21 2034 04/01/21 0502  AST 45* 141* 69*  ALT 39 80* 62*  ALKPHOS 175* 263* 258*  BILITOT 0.7 0.3 0.4  PROT 6.6 8.3* 7.9  ALBUMIN 2.2* 2.7* 2.5*   Recent Labs  Lab 03/31/21 2034  LIPASE 34   No results for input(s): AMMONIA in the last 168 hours. CBC: Recent Labs  Lab 03/27/21 0721 03/31/21 2034 04/01/21 0502  WBC 7.4 14.1* 13.6*  NEUTROABS  --  10.4*  --   HGB 10.4* 11.0* 9.9*  HCT 33.5* 35.3* 31.4*  MCV 85.0 85.3 84.6  PLT 97* 304 283   Cardiac Enzymes: No results for input(s): CKTOTAL, CKMB, CKMBINDEX, TROPONINI in the last 168 hours. BNP (last 3 results) Recent Labs    11/28/20 0655  BNP 41.3    ProBNP (last 3 results) No results for input(s): PROBNP in the last 8760 hours.  CBG: Recent Labs  Lab 03/26/21 1610 03/26/21 1948 03/27/21 0629 03/27/21 1110  GLUCAP 92 88 75 91       Radiology Reports  VAS [place] LOWER EXTREMITY VENOUS (DVT)  Result Date: 04/02/2021  Lower Venous DVT Study Patient Name:  Andrea Hayes  Date of Exam:   04/01/2021 Medical Rec #: [id number]       Accession #:    [id number] Date of Birth: [birth year]       Patient Gender: F Patient Age:   29 years Exam Location:  [facility] Procedure:      VAS [place] LOWER EXTREMITY VENOUS (DVT) Referring Phys: Midge Minium --------------------------------------------------------------------------------  Indications: Pain, and Swelling.  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 Limitations: Patient unable to tolerate compression. Bandage behind knee, patient refused to let me remove it. Comparison Study: Prior negative bilateral lower extremity venous duplex done                   01/23/21 Performing  Technologist: Sherren Kerns RVS  Examination Guidelines: A complete evaluation includes B-mode imaging, spectral Doppler, color Doppler, and power Doppler as needed of all accessible portions of each vessel. Bilateral testing is considered an integral part of a complete examination. Limited examinations for reoccurring indications may be performed as noted. The reflux portion of the exam is performed with the patient in reverse Trendelenburg.  +-------+---------------+---------+-----------+----------+-----------------+ RIGHT  CompressibilityPhasicitySpontaneityPropertiesThrombus Aging    +-------+---------------+---------+-----------+----------+-----------------+ CFV    Partial        No       No                   Age Indeterminate +-------+---------------+---------+-----------+----------+-----------------+ FV ProxPartial        No       No                                     +-------+---------------+---------+-----------+----------+-----------------+  PFV    Partial        No       No                                     +-------+---------------+---------+-----------+----------+-----------------+   +---------+---------------+---------+-----------+----------+-------------------+ LEFT     CompressibilityPhasicitySpontaneityPropertiesThrombus Aging      +---------+---------------+---------+-----------+----------+-------------------+ CFV      Full           Yes      Yes                                      +---------+---------------+---------+-----------+----------+-------------------+ SFJ      Full                                                             +---------+---------------+---------+-----------+----------+-------------------+ FV Prox                 No       No                   Acute               +---------+---------------+---------+-----------+----------+-------------------+ FV Mid                  No       No                   Acute                +---------+---------------+---------+-----------+----------+-------------------+ FV Distal               No       No                   Acute               +---------+---------------+---------+-----------+----------+-------------------+ PFV                     Yes      Yes                  patent by color and                                                       Doppler             +---------+---------------+---------+-----------+----------+-------------------+ POP                                                   unable to visualize  secondary to                                                              bandage             +---------+---------------+---------+-----------+----------+-------------------+ PTV      None           No       No                   Acute               +---------+---------------+---------+-----------+----------+-------------------+ PERO                                                  Not well visualized +---------+---------------+---------+-----------+----------+-------------------+     Summary: RIGHT: - Findings consistent with age indeterminate deep vein thrombosis involving the right common femoral vein, right femoral vein, and right proximal profunda vein. - Ultrasound characteristics of enlarged lymph nodes are noted in the groin.  LEFT: - Findings consistent with acute deep vein thrombosis involving the left femoral vein, and left posterior tibial veins. - Ultrasound characteristics of enlarged lymph nodes noted in the groin.  *See table(s) above for measurements and observations. Electronically signed by Coral Else MD on 04/02/2021 at 9:31:45 AM.    Final    CT Angio Abd/Pel W and/or Wo Contrast  Result Date: 03/31/2021 CLINICAL DATA:  Acute mesenteric ischemia, history of IVC thrombosis and DVT EXAM: CTA ABDOMEN AND PELVIS WITHOUT AND WITH CONTRAST TECHNIQUE:  Multidetector CT imaging of the abdomen and pelvis was performed using the standard protocol during bolus administration of intravenous contrast. Multiplanar reconstructed images and MIPs were obtained and reviewed to evaluate the vascular anatomy. CONTRAST:  75mL OMNIPAQUE IOHEXOL 350 MG/ML SOLN COMPARISON:  03/22/2021 FINDINGS: VASCULAR Aorta: Normal caliber aorta without aneurysm, dissection, vasculitis or significant stenosis. Celiac: Patent without evidence of aneurysm, dissection, vasculitis or significant stenosis. SMA: Patent without evidence of aneurysm, dissection, vasculitis or significant stenosis. Renals: Both renal arteries are patent without evidence of aneurysm, dissection, vasculitis, fibromuscular dysplasia or significant stenosis. IMA: Patent without evidence of aneurysm, dissection, vasculitis or significant stenosis. Inflow: Patent without evidence of aneurysm, dissection, vasculitis or significant stenosis. Proximal Outflow: Bilateral common femoral and visualized portions of the superficial and profunda femoral arteries are patent without evidence of aneurysm, dissection, vasculitis or significant stenosis. 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 There is continued acute DVT involving the right common iliac, right external iliac, and bilateral internal iliac veins. Overall decreased clot burden since prior study. Review of the MIP images confirms the above findings. NON-VASCULAR Lower chest: No acute pleural or parenchymal lung disease. Hepatobiliary: No focal liver abnormality is seen. No gallstones, gallbladder wall thickening, or biliary dilatation. Pancreas: Unremarkable. No pancreatic ductal dilatation or surrounding inflammatory changes. Spleen: Normal in size without focal abnormality. Adrenals/Urinary Tract: Adrenal glands are unremarkable. Kidneys are normal, without renal calculi, focal lesion, or hydronephrosis. Bladder is unremarkable. Stomach/Bowel: No  bowel obstruction or ileus. Normal appendix right lower quadrant. No bowel wall thickening or inflammatory change. No evidence of mesenteric ischemia. Lymphatic: Stable  borderline enlarged bilateral iliac chain lymph nodes may be reactive. No other pathologic adenopathy. Reproductive: 3.2 cm simple appearing left ovarian cyst. Right ovary and uterus are unremarkable. Other: Decreased presacral edema consistent with improving DVT. No free intraperitoneal fluid or free gas. Musculoskeletal: There are no acute or destructive bony lesions. 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 Reconstructed images demonstrate no additional findings. IMPRESSION: VASCULAR 1. No evidence of mesenteric ischemia. Arterial structures are widely patent. 2. Persistent but improving deep venous thrombosis involving the right common iliac, right external iliac, and bilateral internal iliac veins. The IVC thrombus seen previously has resolved in the interim. NON-VASCULAR 1. No bowel obstruction or ileus. No evidence of mesenteric ischemia. 2. 3.2 cm simple left ovarian cyst. No follow-up imaging is recommended. 3. Likely reactive borderline enlarged lymph nodes along the bilateral iliac and inguinal distributions. Electronically Signed   By: Sharlet Salina M.D.   On: 03/31/2021 23:05       Antibiotics: Anti-infectives (From admission, onward)    None         DVT prophylaxis: On IV heparin  Code Status: Full code  Family Communication: No family at bedside   Consultants: Vascular surgery Gastroenterology Hematology  Procedures:     Objective    Physical Examination:   General-appears in no acute distress Heart-S1-S2, regular, no murmur auscultated Lungs-clear to auscultation bilaterally, no wheezing or crackles auscultated Abdomen-soft, nontender, no organomegaly Extremities-no edema in the lower extremities Neuro-alert, oriented x3, no focal deficit  noted  Status is: Inpatient  Dispo: The patient is from: Home              Anticipated d/c is to: Home              Anticipated d/c date is: 04/05/2021              Patient currently not stable for discharge  Barrier to discharge-Coumadin should be therapeutic before discharge  COVID-19 Labs  No results for input(s): DDIMER, FERRITIN, LDH, CRP in the last 72 hours.  Lab Results  Component Value Date   SARSCOV2NAA NEGATIVE 04/01/2021   SARSCOV2NAA NEGATIVE 03/22/2021   SARSCOV2NAA NEGATIVE 01/23/2021   SARSCOV2NAA NEGATIVE 01/21/2021            Recent Results (from the past 240 hour(s))  SARS CORONAVIRUS 2 (TAT 6-24 HRS) Nasopharyngeal Nasopharyngeal Swab     Status: None   Collection Time: 04/01/21  4:02 AM   Specimen: Nasopharyngeal Swab  Result Value Ref Range Status   SARS Coronavirus 2 NEGATIVE NEGATIVE Final    Comment: (NOTE) SARS-CoV-2 target nucleic acids are NOT DETECTED.  The SARS-CoV-2 RNA is generally detectable in upper and lower respiratory specimens during the acute phase of infection. Negative results do not preclude SARS-CoV-2 infection, do not rule out co-infections with other pathogens, and should not be used as the sole basis for treatment or other patient management decisions. Negative results must be combined with clinical observations, patient history, and epidemiological information. The expected result is Negative.  Fact Sheet for Patients: HairSlick.no  Fact Sheet for Healthcare Providers: quierodirigir.com  This test is not yet approved or cleared by the [place] FDA and  has been authorized for detection and/or diagnosis of SARS-CoV-2 by FDA under an Emergency Use Authorization (EUA). This EUA will remain  in effect (meaning this test can be used) for the duration of the COVID-19 declaration under Se ction 564(b)(1) of the Act, 21 U.S.C. section 360bbb-3(b)(1),  unless the  authorization is terminated or revoked sooner.  Performed at [facility], [address] [place], [place] [address]     Meredeth Ide   [facility] If 7PM-7AM, please contact night-coverage at [email or web address], Office  [phone]   04/02/2021, 1:13 PM  LOS: 0 days

## 2021-04-03 ENCOUNTER — Other Ambulatory Visit: Payer: Self-pay | Admitting: *Deleted

## 2021-04-03 DIAGNOSIS — R1084 Generalized abdominal pain: Secondary | ICD-10-CM | POA: Diagnosis not present

## 2021-04-03 DIAGNOSIS — M79605 Pain in left leg: Secondary | ICD-10-CM | POA: Diagnosis not present

## 2021-04-03 DIAGNOSIS — R195 Other fecal abnormalities: Secondary | ICD-10-CM | POA: Diagnosis not present

## 2021-04-03 DIAGNOSIS — K921 Melena: Secondary | ICD-10-CM | POA: Diagnosis not present

## 2021-04-03 LAB — APTT
aPTT: 43 seconds — ABNORMAL HIGH (ref 24–36)
aPTT: 47 seconds — ABNORMAL HIGH (ref 24–36)
aPTT: 65 seconds — ABNORMAL HIGH (ref 24–36)

## 2021-04-03 LAB — COMPREHENSIVE METABOLIC PANEL
ALT: 145 U/L — ABNORMAL HIGH (ref 0–44)
AST: 175 U/L — ABNORMAL HIGH (ref 15–41)
Albumin: 2.3 g/dL — ABNORMAL LOW (ref 3.5–5.0)
Alkaline Phosphatase: 359 U/L — ABNORMAL HIGH (ref 38–126)
Anion gap: 9 (ref 5–15)
BUN: 6 mg/dL (ref 6–20)
CO2: 22 mmol/L (ref 22–32)
Calcium: 8.8 mg/dL — ABNORMAL LOW (ref 8.9–10.3)
Chloride: 102 mmol/L (ref 98–111)
Creatinine, Ser: 0.62 mg/dL (ref 0.44–1.00)
GFR, Estimated: >60 mL/min (ref >60)
Glucose, Bld: 104 mg/dL — ABNORMAL HIGH (ref 70–99)
Potassium: 3.8 mmol/L (ref 3.5–5.1)
Sodium: 133 mmol/L — ABNORMAL LOW (ref 135–145)
Total Bilirubin: 0.5 mg/dL (ref 0.3–1.2)
Total Protein: 7.4 g/dL (ref 6.5–8.1)

## 2021-04-03 LAB — CBC
HCT: 31.2 % — ABNORMAL LOW (ref 36.0–46.0)
Hemoglobin: 9.7 g/dL — ABNORMAL LOW (ref 12.0–15.0)
MCH: 25.9 pg — ABNORMAL LOW (ref 26.0–34.0)
MCHC: 31.1 g/dL (ref 30.0–36.0)
MCV: 83.4 fL (ref 80.0–100.0)
Platelets: 197 10*3/uL (ref 150–400)
RBC: 3.74 MIL/uL — ABNORMAL LOW (ref 3.87–5.11)
RDW: 13.9 % (ref 11.5–15.5)
WBC: 7.8 10*3/uL (ref 4.0–10.5)
nRBC: 0 % (ref 0.0–0.2)

## 2021-04-03 LAB — HEPARIN LEVEL (UNFRACTIONATED): Heparin Unfractionated: 0.7 IU/mL (ref 0.30–0.70)

## 2021-04-03 LAB — ANTI-SMOOTH MUSCLE ANTIBODY, IGG: F-Actin IgG: 7 Units (ref 0–19)

## 2021-04-03 LAB — IGG: IgG (Immunoglobin G), Serum: 1888 mg/dL — ABNORMAL HIGH (ref 586–1602)

## 2021-04-03 LAB — MITOCHONDRIAL ANTIBODIES: Mitochondrial M2 Ab, IgG: <20 Units (ref 0.0–20.0)

## 2021-04-03 LAB — CERULOPLASMIN: Ceruloplasmin: 54.5 mg/dL — ABNORMAL HIGH (ref 19.0–39.0)

## 2021-04-03 LAB — PROTIME-INR
INR: 1.1 (ref 0.8–1.2)
Prothrombin Time: 14.1 seconds (ref 11.4–15.2)

## 2021-04-03 LAB — ALPHA-1-ANTITRYPSIN: A-1 Antitrypsin, Ser: 322 mg/dL — ABNORMAL HIGH (ref 100–188)

## 2021-04-03 MED ORDER — HYDROMORPHONE HCL 1 MG/ML IJ SOLN
1.0000 mg | Freq: Once | INTRAMUSCULAR | Status: AC
  Administered 2021-04-03: 1 mg via INTRAVENOUS
  Filled 2021-04-03: qty 1

## 2021-04-03 MED ORDER — WARFARIN - PHARMACIST DOSING INPATIENT: Freq: Every day | Status: DC

## 2021-04-03 MED ORDER — COUMADIN BOOK
Freq: Once | Status: AC
  Filled 2021-04-03: qty 1

## 2021-04-03 MED ORDER — METHOCARBAMOL 500 MG PO TABS
500.0000 mg | ORAL_TABLET | Freq: Once | ORAL | Status: AC
  Administered 2021-04-03: 500 mg via ORAL
  Filled 2021-04-03: qty 1

## 2021-04-03 MED ORDER — WARFARIN VIDEO: Freq: Once | Status: AC

## 2021-04-03 MED ORDER — WARFARIN SODIUM 7.5 MG PO TABS
7.5000 mg | ORAL_TABLET | Freq: Once | ORAL | Status: AC
  Administered 2021-04-03: 7.5 mg via ORAL
  Filled 2021-04-03 (×2): qty 1

## 2021-04-03 NOTE — Progress Notes (Signed)
ANTICOAGULATION CONSULT NOTE - Follow Up Consult  Pharmacy Consult for Heparin Indication: DVT/PE  No Known Allergies  Patient Measurements: Height: 5\' 5"  (165.1 cm) Weight: 76.4 kg (168 lb 6.9 oz) IBW/kg (Calculated) : 57 Heparin Dosing Weight:   Vital Signs: Temp: 98.7 F (37.1 C) (10/03 1636) Temp Source: Oral (10/03 1636) BP: 111/75 (10/03 1636) Pulse Rate: 100 (10/03 1636)  Labs: Recent Labs    04/01/21 0502 04/02/21 1004 04/02/21 1004 04/03/21 0417 04/03/21 1043 04/03/21 2034  HGB 9.9*  --   --  9.7*  --   --   HCT 31.4*  --   --  31.2*  --   --   PLT 283  --   --  197  --   --   APTT  --  40*   < > 43* 47* 65*  LABPROT  --   --   --   --  14.1  --   INR  --   --   --   --  1.1  --   HEPARINUNFRC  --  >1.10*  --  0.70  --   --   CREATININE 0.74  --   --  0.62  --   --    < > = values in this interval not displayed.    Estimated Creatinine Clearance: 105.2 mL/min (by C-G formula based on SCr of 0.62 mg/dL).  Assessment:  Anticoag: Xarelto for recent PE>>warfarin (goal INR 2.5-3.5), hepatic vein thrombosis. Continued on admission>>transitioned to heparin gtt --CTA A/P with persistent but improving DVT; previous IVC thrombus resolved - aPTT 65 slightly < goal.  Goal of Therapy:  aPTT 66-102 seconds Monitor platelets by anticoagulation protocol: Yes   Plan:  Increase IV heparin slightly to 1600 units/hr Daily HL, aPTT, CBC  Zacharias Ridling S. 2035, PharmD, BCPS Clinical Staff Pharmacist [email or web address] Merilynn Finland, Emett Stapel Stillinger 04/03/2021,9:26 PM

## 2021-04-03 NOTE — Patient Outreach (Signed)
[facility]) Care Management  04/03/2021  Chrishelle Kinchen 1991-06-17 [id number]   [facility] Care coordination  Noted at scheduled time of follow up outreach that patient was hospitalized on 03/31/21 after going to the ED for the continued LLE anterior pain, melena Dx acute LLE DVT (left femoral,left posterior tibial veins, melena  Patient Active Problem List   Diagnosis Date Noted   Lower extremity pain, anterior, left 04/01/2021   Abdominal pain 03/31/2021   DVT (deep venous thrombosis) (HCC) 03/22/2021   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   Acute respiratory failure with hypoxia (HCC) 01/26/2021   Chronic right arterial ischemic stroke, MCA (middle cerebral artery) 01/23/2021   Pulmonary embolism (HCC) 01/23/2021   Leukocytosis 01/23/2021   Thrombocytopenia (HCC) 01/23/2021   Elevated liver enzymes 01/23/2021   Chest pain 01/23/2021   Back pain 01/23/2021   Vaginal delivery 12/05/2020   Headache 11/28/2020   HTN (hypertension) 11/28/2020   Severe preeclampsia, delivered 11/04/2020   IUGR (intrauterine growth restriction) affecting care of mother 10/31/2020   Thrombocytopenia affecting pregnancy (HCC) 05/27/2020   Acute right arterial ischemic stroke, middle cerebral artery (MCA) (HCC)    Dural venous sinus thrombosis    Idiopathic intracranial hypertension    Cerebral venous thrombosis 07/24/2018     Plan RN CM will follow up with patient within the next 3-7 business days  Shanta Hartner L. Noelle Penner, RN, BSN, CCM [facility] Telephonic Care Management Care Coordinator Office number [phone] Mobile number [phone]  Main [facility] number [phone] Fax number [phone]

## 2021-04-03 NOTE — Progress Notes (Signed)
[facility]  PROGRESS NOTE  Andrea Hayes OZD:[id number] DOB: December 31, 1990 DOA: 03/31/2021 PCP: [name], MD   Brief HPI:    Patient admitted this morning, see detailed H&P by Dr Hal Hope 30 year old female with history of cerebral venous thrombosis, idiopathic intracranial hypertension, history of CVA who was recently admitted in May 2022 for intra cerebral thrombosis and subsequently in July for pulmonary embolism, she was noted in March 22, 2021 for abdominal pain and lower extremity swelling and was found to have occlusive thrombosis of infrarenal IVC and bilateral common right iliac, external iliac and patient underwent tPA.  After 24 hours of tPA she underwent mechanical thrombectomy with vascular surgery.  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  She subsequently developed new pulmonary embolism.  She was discharged home on Xarelto.  Patient says she developed left leg pain making it difficult to walk.  Also complained of epigastric and back pain.   CT abdomen/pelvis shows no evidence of mesenteric ischemia, persistent but improving deep vein thrombosis involving the right common iliac, right external leg and bilateral internal iliac veins.  IVC thrombus has resolved.   Venous duplex of lower extremities showed finding consistent with acute deep vein thrombosis involving the left femoral vein, left posterior tibial veins.     Subjective   Patient seen and examined, leg pain has improved.  Started on IV heparin GTT yesterday.   Assessment/Plan:     Acute DVT of left lower extremity -Patient has extensive history of venous thromboembolism -Presented with left leg pain -Venous duplex of lower extremity showed deep vein thrombosis M[place] left femoral vein, left posterior tibial veins -S/p mechanical thrombectomy involving right common iliac, right external iliac and bilateral internal iliac veins -CT abdomen/pelvis  showed no evidence of mesenteric ischemia, persistent but improved amlodipine thrombosis involving right common iliac, right external leg and bilateral internal iliac veins.  IVC thrombus has resolved. -Vascular surgery was consulted; no further intervention recommended -Also consulted hematology/oncology; anticoagulation switched from Xarelto to IV heparin gtt. we will start on warfarin per pharmacy today, goal of INR between 2.5-3.5 -Patient to follow-up with hematology/oncology, Dr. Burr Medico as outpatient   Melena -FOBT was positive -Patient also had epigastric pain -Hemoglobin dropped to 9.7 today -GI was consulted, no plan for EGD at this time -Continue pantoprazole -Follow CBC in a.m.  Hypertension -Continue metoprolol  Idiopathic intracranial hypertension -Continue acetazolamide   History of cerebral vein thrombosis, pulmonary embolism -On anticoagulation as above  Transaminitis -LFTs are up today -AST 175, ALT 145, alk phos 359 -Might need abdominal ultrasound -GI following   Scheduled medications:    acetaZOLAMIDE  250 mg Oral BID   coumadin book   Does not apply Once   docusate sodium  100 mg Oral QHS   metoprolol succinate  25 mg Oral Daily   pantoprazole  40 mg Oral BID   polyethylene glycol  17 g Oral Daily   warfarin  7.5 mg Oral ONCE-1600   warfarin   Does not apply Once   Warfarin - Pharmacist Dosing Inpatient   Does not apply q1600     Data Reviewed:   CBG:  No results for input(s): GLUCAP in the last 168 hours.   SpO2: 99 %    Vitals:   04/02/21 0900 04/02/21 1506 04/02/21 1945 04/03/21 0816  BP: 105/69 95/63 108/67 103/65  Pulse: (!) 106 98 (!) 108 100  Resp: 17 18 18 17   Temp: 98.7 F (  37.1 C) 98 F (36.7 C) 98.4 F (36.9 C) 98.5 F (36.9 C)  TempSrc:  Oral Oral Oral  SpO2: 100% 100% 100% 99%  Weight:      Height:         Intake/Output Summary (Last 24 hours) at 04/03/2021 1210 Last data filed at 04/03/2021 0522 Gross per 24 hour   Intake 240 ml  Output --  Net 240 ml    10/01 1901 - 10/03 0700 In: 359 [P.O.:359] Out: -   Filed Weights   03/31/21 1856  Weight: 76.4 kg    Data Reviewed: Basic Metabolic Panel: Recent Labs  Lab 03/31/21 2034 04/01/21 0502 04/03/21 0417  NA 131* 130* 133*  K 3.9 3.3* 3.8  CL 101 101 102  CO2 18* 18* 22  GLUCOSE 105* 117* 104*  BUN 9 10 6   CREATININE 0.65 0.74 0.62  CALCIUM 9.5 8.9 8.8*   Liver Function Tests: Recent Labs  Lab 03/31/21 2034 04/01/21 0502 04/03/21 0417  AST 141* 69* 175*  ALT 80* 62* 145*  ALKPHOS 263* 258* 359*  BILITOT 0.3 0.4 0.5  PROT 8.3* 7.9 7.4  ALBUMIN 2.7* 2.5* 2.3*   Recent Labs  Lab 03/31/21 2034  LIPASE 34   No results for input(s): AMMONIA in the last 168 hours. CBC: Recent Labs  Lab 03/31/21 2034 04/01/21 0502 04/03/21 0417  WBC 14.1* 13.6* 7.8  NEUTROABS 10.4*  --   --   HGB 11.0* 9.9* 9.7*  HCT 35.3* 31.4* 31.2*  MCV 85.3 84.6 83.4  PLT 304 283 197   Cardiac Enzymes: No results for input(s): CKTOTAL, CKMB, CKMBINDEX, TROPONINI in the last 168 hours. BNP (last 3 results) Recent Labs    11/28/20 0655  BNP 41.3    ProBNP (last 3 results) No results for input(s): PROBNP in the last 8760 hours.  CBG: No results for input(s): GLUCAP in the last 168 hours.      Radiology Reports  No results found.     Antibiotics: Anti-infectives (From admission, onward)    None         DVT prophylaxis: On IV heparin  Code Status: Full code  Family Communication: No family at bedside   Consultants: Vascular surgery Gastroenterology Hematology  Procedures:     Objective    Physical Examination:   General-appears in no acute distress Heart-S1-S2, regular, no murmur auscultated Lungs-clear to auscultation bilaterally, no wheezing or crackles auscultated Abdomen-soft, nontender, no organomegaly Extremities-no edema in the lower extremities Neuro-alert, oriented x3, no focal deficit  noted  Status is: Inpatient  Dispo: The patient is from: Home              Anticipated d/c is to: Home              Anticipated d/c date is: 04/05/2021              Patient currently not stable for discharge  Barrier to discharge-Coumadin should be therapeutic before discharge  COVID-19 Labs  No results for input(s): DDIMER, FERRITIN, LDH, CRP in the last 72 hours.  Lab Results  Component Value Date   SARSCOV2NAA NEGATIVE 04/01/2021   [place] NEGATIVE 03/22/2021   [place] NEGATIVE 01/23/2021   [place] NEGATIVE 01/21/2021            Recent Results (from the past 240 hour(s))  SARS CORONAVIRUS 2 (TAT 6-24 HRS) Nasopharyngeal Nasopharyngeal Swab     Status: None   Collection Time: 04/01/21  4:02 AM   Specimen: Nasopharyngeal  Swab  Result Value Ref Range Status   SARS Coronavirus 2 NEGATIVE NEGATIVE Final    Comment: (NOTE) SARS-CoV-2 target nucleic acids are NOT DETECTED.  The SARS-CoV-2 RNA is generally detectable in upper and lower respiratory specimens during the acute phase of infection. Negative results do not preclude SARS-CoV-2 infection, do not rule out co-infections with other pathogens, and should not be used as the sole basis for treatment or other patient management decisions. Negative results must be combined with clinical observations, patient history, and epidemiological information. The expected result is Negative.  Fact Sheet for Patients: SugarRoll.be  Fact Sheet for Healthcare Providers: https://www.woods-mathews.com/  This test is not yet approved or cleared by the [place] FDA and  has been authorized for detection and/or diagnosis of SARS-CoV-2 by FDA under an Emergency Use Authorization (EUA). This EUA will remain  in effect (meaning this test can be used) for the duration of the COVID-19 declaration under Se ction 564(b)(1) of the Act, 21 U.S.C. section 360bbb-3(b)(1), unless the  authorization is terminated or revoked sooner.  Performed at [facility], [place] [address], [place], [place] [address]     [facility] If 7PM-7AM, please contact night-coverage at [email or web address], Office  [phone]   04/03/2021, 12:10 PM  LOS: 1 day

## 2021-04-03 NOTE — Progress Notes (Signed)
ANTICOAGULATION CONSULT NOTE  Pharmacy Consult for Heparin Indication: DVT  No Known Allergies  Patient Measurements: Height: 5\' 5"  (165.1 cm) Weight: 76.4 kg (168 lb 6.9 oz) IBW/kg (Calculated) : 57 Heparin Dosing Weight: 76.4 kg  Vital Signs: Temp: 98.4 F (36.9 C) (10/02 1945) Temp Source: Oral (10/02 1945) BP: 108/67 (10/02 1945) Pulse Rate: 108 (10/02 1945)  Labs: Recent Labs    03/31/21 2034 04/01/21 0502 04/02/21 1004 04/03/21 0417  HGB 11.0* 9.9*  --  9.7*  HCT 35.3* 31.4*  --  31.2*  PLT 304 283  --  197  APTT  --   --  40* 43*  HEPARINUNFRC  --   --  >1.10* 0.70  CREATININE 0.65 0.74  --   --      Estimated Creatinine Clearance: 105.2 mL/min (by C-G formula based on SCr of 0.74 mg/dL).  Assessment: Ms. Walrond is a 30 year old female with a diagnosed age-indeterminate thrombus in the right common femoral, femoral and profunda vein.  On the left there is acute DVT in the femoral vein and posterior tibial vein. Of note, she had a recent PE and hepatic vein thrombus and was started on xarelto.Started Xarelto 15mg  BID on 9/24, to change to 20mg  BID on 10/15. Continued on admission. Her last dose of xarelto was 10/2 @ 0932.   Heparin level 0.7 (affected by Xarelto), aPTT 43 sec (subtherapeutic) on gtt at 1200 units/hr. No issues with line or bleeding reported per RN.  Goal of Therapy:  Heparin level 0.3-0.7 units/ml; aPTT 66-102 sec Monitor platelets by anticoagulation protocol: Yes   Plan:  Increase heparin infusion to 1450 units/hr  F/u aPTT in 6 hours  , PharmD, BCPS Please see amion for complete clinical pharmacist phone list 04/03/2021 4:56 AM

## 2021-04-03 NOTE — Progress Notes (Signed)
ANTICOAGULATION CONSULT NOTE  Pharmacy Consult for Heparin Indication: DVT  No Known Allergies  Patient Measurements: Height: 5\' 5"  (165.1 cm) Weight: 76.4 kg (168 lb 6.9 oz) IBW/kg (Calculated) : 57 Heparin Dosing Weight: 76.4 kg  Vital Signs: Temp: 98.5 F (36.9 C) (10/03 0816) Temp Source: Oral (10/03 0816) BP: 103/65 (10/03 0816) Pulse Rate: 100 (10/03 0816)  Labs: Recent Labs    03/31/21 2034 04/01/21 0502 04/02/21 1004 04/03/21 0417 04/03/21 1043  HGB 11.0* 9.9*  --  9.7*  --   HCT 35.3* 31.4*  --  31.2*  --   PLT 304 283  --  197  --   APTT  --   --  40* 43* 47*  LABPROT  --   --   --   --  14.1  INR  --   --   --   --  1.1  HEPARINUNFRC  --   --  >1.10* 0.70  --   CREATININE 0.65 0.74  --  0.62  --      Estimated Creatinine Clearance: 105.2 mL/min (by C-G formula based on SCr of 0.62 mg/dL).  Assessment: Ms. Spohr is a 30 year old female with a diagnosed age-indeterminate thrombus in the right common femoral, femoral and profunda vein.  On the left there is acute DVT in the femoral vein and posterior tibial vein. Of note, she had a recent PE and hepatic vein thrombus and was started on xarelto.Started Xarelto 15mg  BID on 9/24, to change to 20mg  BID on 10/15. Continued on admission. Her last dose of xarelto was 10/2 @ 0932.   **Transitioning Xarelto>> warfarin per heme/onc**  Aptt 47 (on 1450 units/hr) INR 1.1 (warfarin new start) No documented signs/symptoms of bleed  Goal of Therapy:  Heparin level 0.3-0.7 units/ml; aPTT 66-102 sec Monitor platelets by anticoagulation protocol: Yes INR goal 2.5-3.5  Plan:  Will increase heparin to 1550 units/hr aPTT ordered for 2000 Warfarin 7.5mg  x1 (monitor closely 2/2 elevated LFTs) Daily heparin level, apTT, and INR Continue to monitor closely for signs/symptoms of bleed  Thank you for allowing pharmacy to be a part of this patient's care.  11/15, PharmD Clinical Pharmacist  Please check AMION  for all [facility] Pharmacy numbers After 10:00 PM, call Main Pharmacy [phone]

## 2021-04-03 NOTE — Progress Notes (Addendum)
Daily Rounding Note  04/03/2021, 11:40 AM  LOS: 1 day   SUBJECTIVE:   Chief complaint:  abdominal pain   Abd pain improved.  Large brown stool yesterday, flatus only today.  No nausea.  Tolerating solids but still w anorexia.  OBJECTIVE:         Vital signs in last 24 hours:    Temp:  [98 F (36.7 C)-98.5 F (36.9 C)] 98.5 F (36.9 C) (10/03 0816) Pulse Rate:  [98-108] 100 (10/03 0816) Resp:  [17-18] 17 (10/03 0816) BP: (95-108)/(63-67) 103/65 (10/03 0816) SpO2:  [99 %-100 %] 99 % (10/03 0816) Last BM Date: 04/02/21 Filed Weights   03/31/21 1856  Weight: 76.4 kg   General: looks well.  Comfortable.  alert   Heart: RRR Chest: clear bil.  No dyspnea or cough.   Abdomen: soft, NT, ND.  Active BS.    Extremities: no CCE.   Neuro/Psych:  alert.  Oriented x 3.  No weakness or tremors.  Pleasant.  Fluid speech.    Intake/Output from previous day: 10/02 0701 - 10/03 0700 In: 240 [P.O.:240] Out: -   Intake/Output this shift: No intake/output data recorded.  Lab Results: Recent Labs    03/31/21 2034 04/01/21 0502 04/03/21 0417  WBC 14.1* 13.6* 7.8  HGB 11.0* 9.9* 9.7*  HCT 35.3* 31.4* 31.2*  PLT 304 283 197   BMET Recent Labs    03/31/21 2034 04/01/21 0502 04/03/21 0417  NA 131* 130* 133*  K 3.9 3.3* 3.8  CL 101 101 102  CO2 18* 18* 22  GLUCOSE 105* 117* 104*  BUN 9 10 6   CREATININE 0.65 0.74 0.62  CALCIUM 9.5 8.9 8.8*   LFT Recent Labs    03/31/21 2034 04/01/21 0502 04/03/21 0417  PROT 8.3* 7.9 7.4  ALBUMIN 2.7* 2.5* 2.3*  AST 141* 69* 175*  ALT 80* 62* 145*  ALKPHOS 263* 258* 359*  BILITOT 0.3 0.4 0.5   PT/INR No results for input(s): LABPROT, INR in the last 72 hours. Hepatitis Panel No results for input(s): HEPBSAG, HCVAB, HEPAIGM, HEPBIGM in the last 72 hours.  Studies/Results: No results found.  Scheduled Meds:  acetaZOLAMIDE  250 mg Oral BID   coumadin book   Does not  apply Once   docusate sodium  100 mg Oral QHS   metoprolol succinate  25 mg Oral Daily   pantoprazole  40 mg Oral BID   polyethylene glycol  17 g Oral Daily   warfarin  7.5 mg Oral ONCE-1600   warfarin   Does not apply Once   Warfarin - Pharmacist Dosing Inpatient   Does not apply q1600   Continuous Infusions:  heparin 1,450 Units/hr (04/03/21 0522)   PRN Meds:.oxyCODONE   ASSESMENT:      Iliac DVT, recurrent extensive thrombosis.  Issues with thrombosis began in 07/2018 with essentially negative hypercoagulable work-up.  Placed on and noncompliant with Lovenox 07/2018, placed on Coumadin 12/2020 w ?compliance, Eliquis 03/2021.  tPA thrombolysis then mechanical thrombectomy along w dx PE 03/2021.  Additional hx CVA, intra cerebral thrombosis 10/2020.    Intermittent dark stools, FOBT positive.  Hgb 11... 9.7.  Low suspicion for clinically significant GI bleed.  Elevated and rising LFTs.  Abdominal pain.  Unclear etiology.   Contrast CTAP showing infrarenal IVC, nonocclusive IVC thrombosis, suspicion for bilateral common iliac vein and right external iliac vein thrombus.  Additionally seen was presacral, RP edema likely due to venous congestion.  On subsequent CTAP/angio there was no mesenteric ischemia, arterial structures widely patent, persistent but improved RCI, right external iliac, bilateral internal iliac vein thrombus.  No evidence parenchymal liver dz on either CT.  Previous IVC thrombus resolved.  Nightly colace, daily Miralax in place to counter narcotic induced constipation.  Hepatitis A IgM, hepatitis B surface antigen, hepatitis B core IgM, HCV all non-reactive.  GGT elevated at 122.  Labs in process include IgG, alpha-1 antitrypsin, ANA, smooth muscle ABs, mitochondrial Abs  Hyponatremia.  Na 133, improved from 130.   PLAN     Await pndg labs for r/O AIH, metabolic sources for liver dz.  Continue daily Miralax and stool softener.      Andrea Hayes  04/03/2021, 11:40 AM Phone  [phone]    I have taken a history, reviewed the chart and examined the patient. I performed a substantive portion of this encounter, including complete performance of at least one of the key components, in conjunction with the APP. I agree with the APP's note, impression and recommendations  Mackynzie Woolford E. Tomasa Rand, MD Andrea Hayes  30 year old female with history of recurrent thromboses, admitted with leg pain possibly felt to be related to persistent iliac DVT, with vague abdominal discomfort, intermittent dark stools and a slight drop in hemoglobin on admission, found to have positive FOBT.  She has not had any evidence of GI bleeding this admission.  Hgb stable on heparin gtt. If patient is going to be in the hospital for a few days, then we can perform a diagnostic EGD/colo because of the positive FOBT, although it does not appear she is actively bleeding.  Liver enzymes increased today, only change was addition of heparin gtt, which can cause elevated LAEs, usually hepatocellular pattern, not mixed.  Continue to trend daily, await lab work up results    Positive FOBT - No evidence of active GI bleed currently - Trend CBC today - Although not actively bleeding, we can perform an EGD/colonoscopy before discharge if she is going to be in the hospital for another few days.     Elevated LAEs, mixed pattern - Work up pending

## 2021-04-03 NOTE — Progress Notes (Signed)
  Progress Note    04/03/2021 7:49 AM * No surgery found *  Subjective:  L leg feels better today   Vitals:   04/02/21 1506 04/02/21 1945  BP: 95/63 108/67  Pulse: 98 (!) 108  Resp: 18 18  Temp: 98 F (36.7 C) 98.4 F (36.9 C)  SpO2: 100% 100%   Physical Exam Lungs:  non labored Incisions:  L popliteal cath site without hematoma Extremities:  L leg compression on Neurologic: A&O  CBC    Component Value Date/Time   WBC 7.8 04/03/2021 0417   RBC 3.74 (L) 04/03/2021 0417   HGB 9.7 (L) 04/03/2021 0417   HGB 13.6 10/31/2020 1343   HCT 31.2 (L) 04/03/2021 0417   HCT 40.7 10/31/2020 1343   PLT 197 04/03/2021 0417   PLT 148 (L) 10/31/2020 1343   MCV 83.4 04/03/2021 0417   MCV 88 10/31/2020 1343   MCH 25.9 (L) 04/03/2021 0417   MCHC 31.1 04/03/2021 0417   RDW 13.9 04/03/2021 0417   RDW 14.2 10/31/2020 1343   LYMPHSABS 2.0 03/31/2021 2034   LYMPHSABS 1.3 05/05/2020 1432   MONOABS 1.3 (H) 03/31/2021 2034   EOSABS 0.1 03/31/2021 2034   EOSABS 0.1 05/05/2020 1432   BASOSABS 0.1 03/31/2021 2034   BASOSABS 0.1 05/05/2020 1432    BMET    Component Value Date/Time   NA 133 (L) 04/03/2021 0417   K 3.8 04/03/2021 0417   CL 102 04/03/2021 0417   CO2 22 04/03/2021 0417   GLUCOSE 104 (H) 04/03/2021 0417   BUN 6 04/03/2021 0417   CREATININE 0.62 04/03/2021 0417   CALCIUM 8.8 (L) 04/03/2021 0417   GFRNONAA >60 04/03/2021 0417   GFRAA >60 01/27/2020 0737    INR    Component Value Date/Time   INR 1.0 03/22/2021 2020     Intake/Output Summary (Last 24 hours) at 04/03/2021 0749 Last data filed at 04/03/2021 0522 Gross per 24 hour  Intake 240 ml  Output --  Net 240 ml     Assessment/Plan:  30 y.o. female with left leg and groin pain  * No surgery found *   -Left leg pain subjectively improved -Continue compression and elevation when in bed; avoid prolonged sitting and standing when home -home when cleared medically; office will arrange follow up   Emilie Rutter, PA-C [facility] [phone] 04/03/2021 7:49 AM

## 2021-04-04 ENCOUNTER — Telehealth: Payer: Self-pay | Admitting: Hematology

## 2021-04-04 DIAGNOSIS — M79605 Pain in left leg: Secondary | ICD-10-CM | POA: Diagnosis not present

## 2021-04-04 DIAGNOSIS — R748 Abnormal levels of other serum enzymes: Secondary | ICD-10-CM

## 2021-04-04 DIAGNOSIS — R195 Other fecal abnormalities: Secondary | ICD-10-CM | POA: Diagnosis not present

## 2021-04-04 DIAGNOSIS — K921 Melena: Secondary | ICD-10-CM | POA: Diagnosis not present

## 2021-04-04 LAB — PROTIME-INR
INR: 0.9 (ref 0.8–1.2)
Prothrombin Time: 12.4 seconds (ref 11.4–15.2)

## 2021-04-04 LAB — CBC
HCT: 31.9 % — ABNORMAL LOW (ref 36.0–46.0)
Hemoglobin: 9.7 g/dL — ABNORMAL LOW (ref 12.0–15.0)
MCH: 25.7 pg — ABNORMAL LOW (ref 26.0–34.0)
MCHC: 30.4 g/dL (ref 30.0–36.0)
MCV: 84.6 fL (ref 80.0–100.0)
Platelets: 212 10*3/uL (ref 150–400)
RBC: 3.77 MIL/uL — ABNORMAL LOW (ref 3.87–5.11)
RDW: 13.9 % (ref 11.5–15.5)
WBC: 9.2 10*3/uL (ref 4.0–10.5)
nRBC: 0 % (ref 0.0–0.2)

## 2021-04-04 LAB — ANA: Anti Nuclear Antibody (ANA): POSITIVE — AB

## 2021-04-04 LAB — HEPARIN LEVEL (UNFRACTIONATED): Heparin Unfractionated: 0.55 IU/mL (ref 0.30–0.70)

## 2021-04-04 LAB — APTT: aPTT: 71 seconds — ABNORMAL HIGH (ref 24–36)

## 2021-04-04 MED ORDER — WARFARIN SODIUM 7.5 MG PO TABS
7.5000 mg | ORAL_TABLET | Freq: Once | ORAL | Status: AC
  Administered 2021-04-04: 7.5 mg via ORAL
  Filled 2021-04-04: qty 1

## 2021-04-04 MED ORDER — PEG 3350-KCL-NABCB-NACL-NASULF 236 G PO SOLR
4000.0000 mL | Freq: Once | ORAL | Status: AC
Start: 2021-04-04 — End: 2021-04-04
  Administered 2021-04-04: 4000 mL via ORAL
  Filled 2021-04-04 (×2): qty 4000

## 2021-04-04 NOTE — Consult Note (Signed)
   [facility] CM Inpatient Consult   04/04/2021  Andrea Hayes October 18, 1990 [id number]  [facility] [[facility]]  Accountable Care Organization [ACO] Patient:  [facility]  Patient is currently active with [facility] [[facility]] Care Management for chronic disease management services.  Patient has been engaged by a [facility].      Patient will receive a post hospital call and will be evaluated for assessments and disease process education.  Patient preparing for ongoing testing planned for tomorrow noted.  Plan: Follow for progress and follow with  Inpatient Transition Of Care [TOC] team member.    Of note, [facility] Care Management services does not replace or interfere with any services that are needed or arranged by inpatient [facility] care management team.  For additional questions or referrals please contact:  Charlesetta Shanks, RN BSN CCM [facility]  [phone] business mobile phone Toll free office [phone]  Fax number: [phone] [email or web address]

## 2021-04-04 NOTE — Progress Notes (Signed)
ANTICOAGULATION CONSULT NOTE  Pharmacy Consult for Heparin Indication: DVT  No Known Allergies  Patient Measurements: Height: 5\' 5"  (165.1 cm) Weight: 76.4 kg (168 lb 6.9 oz) IBW/kg (Calculated) : 57 Heparin Dosing Weight: 76.4 kg  Vital Signs: Temp: 98.4 F (36.9 C) (10/04 0910) Temp Source: Oral (10/04 0910) BP: 106/67 (10/04 0910) Pulse Rate: 95 (10/04 0910)  Labs: Recent Labs    04/02/21 1004 04/03/21 0417 04/03/21 1043 04/03/21 2034 04/04/21 0054 04/04/21 0803  HGB  --  9.7*  --   --  9.7*  --   HCT  --  31.2*  --   --  31.9*  --   PLT  --  197  --   --  212  --   APTT 40* 43* 47* 65*  --  71*  LABPROT  --   --  14.1  --  12.4  --   INR  --   --  1.1  --  0.9  --   HEPARINUNFRC >1.10* 0.70  --   --   --  0.55  CREATININE  --  0.62  --   --   --   --      Estimated Creatinine Clearance: 105.2 mL/min (by C-G formula based on SCr of 0.62 mg/dL).  Assessment: Ms. Voytko is a 30 year old female with a diagnosed age-indeterminate thrombus in the right common femoral, femoral and profunda vein.  On the left there is acute DVT in the femoral vein and posterior tibial vein. Of note, she had a recent PE and hepatic vein thrombus and was started on xarelto.Started Xarelto 15mg  BID on 9/24, to change to 20mg  BID on 10/15. Continued on admission. Her last dose of xarelto was 10/2 @ 0932.   **Transitioning Xarelto>> warfarin per heme/onc**  Aptt 71 (on 1600 units/hr) INR 0.9 (warfarin new start) No documented signs/symptoms of bleed, H/H stable  Goal of Therapy:  Heparin level 0.3-0.7 units/ml; aPTT 66-102 sec Monitor platelets by anticoagulation protocol: Yes INR goal 2.5-3.5  Plan:  Continue heparin 1600 units/hr Warfarin 7.5mg  x1 (monitor closely 2/2 elevated LFTs) Daily heparin level, apTT, and INR Continue to monitor closely for signs/symptoms of bleed  Thank you for allowing pharmacy to be a part of this patient's care.  , PharmD Clinical  Pharmacist  Please check AMION for all [facility] Pharmacy numbers After 10:00 PM, call Main Pharmacy [phone]

## 2021-04-04 NOTE — Plan of Care (Signed)
  Problem: Education: Goal: Knowledge of General Education information will improve Description: Including pain rating scale, medication(s)/side effects and non-pharmacologic comfort measures Outcome: Progressing   Problem: Health Behavior/Discharge Planning: Goal: Ability to manage health-related needs will improve Outcome: Progressing   Problem: Clinical Measurements: Goal: Ability to maintain clinical measurements within normal limits will improve Outcome: Progressing Goal: Will remain free from infection Outcome: Progressing Goal: Diagnostic test results will improve Outcome: Progressing Goal: Cardiovascular complication will be avoided Outcome: Progressing   Problem: Activity: Goal: Risk for activity intolerance will decrease Outcome: Progressing   Problem: Nutrition: Goal: Adequate nutrition will be maintained Outcome: Progressing   Problem: Coping: Goal: Level of anxiety will decrease Outcome: Progressing   Problem: Elimination: Goal: Will not experience complications related to bowel motility Outcome: Progressing Goal: Will not experience complications related to urinary retention Outcome: Progressing   Problem: Pain Managment: Goal: General experience of comfort will improve Outcome: Progressing   Problem: Safety: Goal: Ability to remain free from injury will improve Outcome: Progressing   Problem: Skin Integrity: Goal: Risk for impaired skin integrity will decrease Outcome: Progressing   Report received and care assumed from previous shift. Progressing towards goals as outlined above. VS obtained, shift assessments completed - see flowsheets. PRN pain medications given as needed for c/o L LE pain - see MAR; patient states adequate relief from medications and denies need for further intervention. OOB with standby assist to bathroom, tolerated activity well with steady gait. B/L posterior knee/calf dressings changed this shift. Patient currently resting in  bed, bed in lowest position. Denies needs. Call bell within reach.

## 2021-04-04 NOTE — Progress Notes (Signed)
[facility]  PROGRESS NOTE  Andrea Hayes ZOX:[id number] DOB: [birth year] DOA: 03/31/2021 PCP: [name], MD   Brief HPI:    Patient admitted this morning, see detailed H&P by Dr Hal Hope 30 year old female with history of cerebral venous thrombosis, idiopathic intracranial hypertension, history of CVA who was recently admitted in May 2022 for intra cerebral thrombosis and subsequently in July for pulmonary embolism, she was noted in March 22, 2021 for abdominal pain and lower extremity swelling and was found to have occlusive thrombosis of infrarenal IVC and bilateral common right iliac, external iliac and patient underwent tPA.  After 24 hours of tPA she underwent mechanical thrombectomy with vascular surgery.  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  She subsequently developed new pulmonary embolism.  She was discharged home on Xarelto.  Patient says she developed left leg pain making it difficult to walk.  Also complained of epigastric and back pain.   CT abdomen/pelvis shows no evidence of mesenteric ischemia, persistent but improving deep vein thrombosis involving the right common iliac, right external leg and bilateral internal iliac veins.  IVC thrombus has resolved.   Venous duplex of lower extremities showed finding consistent with acute deep vein thrombosis involving the left femoral vein, left posterior tibial veins.   Vascular surgery was consulted, no intervention recommended.  Hematology was consulted and they recommended to discontinue Xarelto and start heparin along with warfarin.  She had positive FOBT so GI was consulted and plan is for EGD and colonoscopy in a.m.   Subjective   Patient seen and examined, leg pain has improved.  Plan for EGD and colonoscopy in a.m.   Assessment/Plan:     Acute DVT of left lower extremity -Patient has extensive history of venous thromboembolism -S/p mechanical  thrombectomy involving right common iliac, right external iliac and bilateral internal iliac veins during previous admission -Presented with left leg pain during this admission -Venous duplex of lower extremity showed deep vein thrombosis ;  left femoral vein, left posterior tibial veins -CT abdomen/pelvis during this admission showed no evidence of mesenteric ischemia, persistent but improved  thrombosis involving right common iliac, right external leg and bilateral internal iliac veins.  IVC thrombus has resolved. -Vascular surgery was consulted; no further intervention recommended for new DVT of left lower extremity  Anticoagulation -Consulted hematology/oncology; anticoagulation switched from Xarelto to IV heparin gtt. along with warfarin ;goal of INR between 2.5-3.5 -Patient to follow-up with hematology/oncology,  Dr. Burr Medico as outpatient   Melena -FOBT was positive -Patient also had epigastric pain -Hemoglobin dropped to 9.7 today -GI was consulted, plan for EGD and colonoscopy in a.m. -Continue pantoprazole -Follow CBC in a.m.  Hypertension -Continue metoprolol  Idiopathic intracranial hypertension -Continue acetazolamide   History of cerebral vein thrombosis, pulmonary embolism -On anticoagulation as above  Transaminitis -LFTs are up today -AST 175, ALT 145, alk phos 359 -She has elevated A1 AT and ceruloplasmin, negative AMA/ANA/ASMA -We will follow GI recommendations   Scheduled medications:    acetaZOLAMIDE  250 mg Oral BID   docusate sodium  100 mg Oral QHS   metoprolol succinate  25 mg Oral Daily   pantoprazole  40 mg Oral BID   polyethylene glycol  4,000 mL Oral Once   polyethylene glycol  17 g Oral Daily   warfarin  7.5 mg Oral ONCE-1600   Warfarin - Pharmacist Dosing Inpatient   Does not apply q1600     Data Reviewed:   CBG:  No results for input(s): GLUCAP in the last 168 hours.   SpO2: 100 %    Vitals:   04/03/21 0816 04/03/21 1636 04/04/21  0338 04/04/21 0910  BP: 103/65 111/75 100/61 106/67  Pulse: 100 100 91 95  Resp: _0 Temp: 98.5 F (36.9 C) 98.7 F (37.1 C) 98.3 F (36.8 C) 98.4 F (36.9 C)  TempSrc: Oral Oral Oral Oral  SpO2: 99% 99% 100% 100%  Weight:      Height:         Intake/Output Summary (Last 24 hours) at 04/04/2021 1553 Last data filed at 04/04/2021 0214 Gross per 24 hour  Intake 647.96 ml  Output --  Net 647.96 ml    10/02 1901 - 10/04 0700 In: 888 [P.O.:480; I.V.:408] Out: -   Filed Weights   03/31/21 1856  Weight: 76.4 kg    Data Reviewed: Basic Metabolic Panel: Recent Labs  Lab 03/31/21 2034 04/01/21 0502 04/03/21 0417  NA 131* 130* 133*  K 3.9 3.3* 3.8  CL 101 101 102  CO2 18* 18* 22  GLUCOSE 105* 117* 104*  BUN _1 CREATININE 0.65 0.74 0.62  CALCIUM 9.5 8.9 8.8*   Liver Function Tests: Recent Labs  Lab 03/31/21 2034 04/01/21 0502 04/03/21 0417  AST 141* 69* 175*  ALT 80* 62* 145*  ALKPHOS 263* 258* 359*  BILITOT 0.3 0.4 0.5  PROT 8.3* 7.9 7.4  ALBUMIN 2.7* 2.5* 2.3*   Recent Labs  Lab 03/31/21 2034  LIPASE 34   No results for input(s): AMMONIA in the last 168 hours. CBC: Recent Labs  Lab 03/31/21 2034 04/01/21 0502 04/03/21 0417 04/04/21 0054  WBC 14.1* 13.6* 7.8 9.2  NEUTROABS 10.4*  --   --   --   HGB 11.0* 9.9* 9.7* 9.7*  HCT 35.3* 31.4* 31.2* 31.9*  MCV 85.3 84.6 83.4 84.6  PLT 304 283 197 212   Cardiac Enzymes: No results for input(s): CKTOTAL, CKMB, CKMBINDEX, TROPONINI in the last 168 hours. BNP (last 3 results) Recent Labs    11/28/20 0655  BNP 41.3    ProBNP (last 3 results) No results for input(s): PROBNP in the last 8760 hours.  CBG: No results for input(s): GLUCAP in the last 168 hours.      Radiology Reports  No results found.     Antibiotics: Anti-infectives (From admission, onward)    None         DVT prophylaxis: On IV heparin  Code Status: Full code  Family Communication: No family at  bedside   Consultants: Vascular surgery Gastroenterology Hematology  Procedures:     Objective    Physical Examination:   General-appears in no acute distress Heart-S1-S2, regular, no murmur auscultated Lungs-clear to auscultation bilaterally, no wheezing or crackles auscultated Abdomen-soft, nontender, no organomegaly Extremities-no edema in the lower extremities Neuro-alert, oriented x3, no focal deficit noted  Status is: Inpatient  Dispo: The patient is from: Home              Anticipated d/c is to: Home              Anticipated d/c date is: 04/07/2021              Patient currently not stable for discharge  Barrier to discharge-Coumadin should be therapeutic before discharge  COVID-19 Labs  No results for input(s): DDIMER, FERRITIN, LDH, CRP in the last 72 hours.  Lab Results  Component Value Date   SARSCOV2NAA NEGATIVE  04/01/2021   SARSCOV2NAA NEGATIVE 03/22/2021   [place] NEGATIVE 01/23/2021   [place] NEGATIVE 01/21/2021            Recent Results (from the past 240 hour(s))  SARS CORONAVIRUS 2 (TAT 6-24 HRS) Nasopharyngeal Nasopharyngeal Swab     Status: None   Collection Time: 04/01/21  4:02 AM   Specimen: Nasopharyngeal Swab  Result Value Ref Range Status   SARS Coronavirus 2 NEGATIVE NEGATIVE Final    Comment: (NOTE) SARS-CoV-2 target nucleic acids are NOT DETECTED.  The SARS-CoV-2 RNA is generally detectable in upper and lower respiratory specimens during the acute phase of infection. Negative results do not preclude SARS-CoV-2 infection, do not rule out co-infections with other pathogens, and should not be used as the sole basis for treatment or other patient management decisions. Negative results must be combined with clinical observations, patient history, and epidemiological information. The expected result is Negative.  Fact Sheet for Patients: SugarRoll.be  Fact Sheet for Healthcare  Providers: https://www.woods-mathews.com/  This test is not yet approved or cleared by the [place] FDA and  has been authorized for detection and/or diagnosis of SARS-CoV-2 by FDA under an Emergency Use Authorization (EUA). This EUA will remain  in effect (meaning this test can be used) for the duration of the COVID-19 declaration under Se ction 564(b)(1) of the Act, 21 U.S.C. section 360bbb-3(b)(1), unless the authorization is terminated or revoked sooner.  Performed at [facility], Regino Ramirez [address], [place], [place] [address]     [facility] If 7PM-7AM, please contact night-coverage at [email or web address], Office  [phone]   04/04/2021, 3:53 PM  LOS: 2 days

## 2021-04-04 NOTE — H&P (View-Only) (Signed)
[facility] ROUNDING NOTE   Subjective: No acute events overnight.  Patient doing well.  Leg pain improving.  No abdominal discomfort.  Reports having a large bowel movement which was nonbloody.  Hgb stable at 9.7 this morning.  On heparin gtt until INR at goal. Liver work up with elevated A1AT and ceruloplasmin (no know significance to these findings), mildly elevated IgG.  Neg ASMA/AMA, ANA pnd  Objective: Vital signs in last 24 hours: Temp:  [98.3 F (36.8 C)-98.7 F (37.1 C)] 98.4 F (36.9 C) (10/04 0910) Pulse Rate:  [91-100] 95 (10/04 0910) Resp:  [16-17] 17 (10/04 0910) BP: (100-111)/(61-75) 106/67 (10/04 0910) SpO2:  [99 %-100 %] 100 % (10/04 0910) Last BM Date: 04/02/21 General: NAD Lungs:  CTA b/l, no w/r/r Heart:  RRR, no m/r/g Abdomen:  Soft, NT, ND, +BS Ext:  No c/c/e    Intake/Output from previous day: 10/03 0701 - 10/04 0700 In: 648 [P.O.:240; I.V.:408] Out: -  Intake/Output this shift: No intake/output data recorded.   Lab Results: Recent Labs    04/03/21 0417 04/04/21 0054  WBC 7.8 9.2  HGB 9.7* 9.7*  PLT 197 212  MCV 83.4 84.6   BMET Recent Labs    04/03/21 0417  NA 133*  K 3.8  CL 102  CO2 22  GLUCOSE 104*  BUN 6  CREATININE 0.62  CALCIUM 8.8*   LFT Recent Labs    04/03/21 0417  PROT 7.4  ALBUMIN 2.3*  AST 175*  ALT 145*  ALKPHOS 359*  BILITOT 0.5   PT/INR Recent Labs    04/03/21 1043 04/04/21 0054  INR 1.1 0.9      Imaging/Other results: No results found.    Assessment and Plan:  30-year-old female with history of recurrent thromboses, admitted with leg pain possibly felt to be related to persistent iliac DVT, with vague abdominal discomfort, intermittent dark stools and a slight drop in hemoglobin on admission, found to have positive FOBT.  She has not had any evidence of GI bleeding this admission, and her hgb has been overall stable.  The patient warrants an endoscopic examination because of the  positive FOBT and slight drop in hgb, although nonurgent, it would be most facile and prudent to do these procedures before discharge   Positive FOBT - No evidence of active GI bleed currently - EGD/colonoscopy tomorrow - Clear liquid diet rest of day today, NPO after midnight except for bowel prep - Golytely to start this evening at 6pm.  Drink until stool is light yellow - Continue heparin gtt per protocol.   Elevated LAEs, mixed pattern, stable - Negative work up thus far, continue to monitor.   - Would repeat LAEs again prior to discharge to determine which direction they are trending    Domino Holten E Darshana Curnutt, MD  04/04/2021, 11:25 AM [facility]   

## 2021-04-04 NOTE — Progress Notes (Signed)
[facility] ROUNDING NOTE   Subjective: No acute events overnight.  Patient doing well.  Leg pain improving.  No abdominal discomfort.  Reports having a large bowel movement which was nonbloody.  Hgb stable at 9.7 this morning.  On heparin gtt until INR at goal. Liver work up with elevated A1AT and ceruloplasmin (no know significance to these findings), mildly elevated IgG.  Neg ASMA/AMA, ANA pnd  Objective: Vital signs in last 24 hours: Temp:  [98.3 F (36.8 C)-98.7 F (37.1 C)] 98.4 F (36.9 C) (10/04 0910) Pulse Rate:  [91-100] 95 (10/04 0910) Resp:  [16-17] 17 (10/04 0910) BP: (100-111)/(61-75) 106/67 (10/04 0910) SpO2:  [99 %-100 %] 100 % (10/04 0910) Last BM Date: 04/02/21 General: NAD Lungs:  CTA b/l, no w/r/r Heart:  RRR, no m/r/g Abdomen:  Soft, NT, ND, +BS Ext:  No c/c/e    Intake/Output from previous day: 10/03 0701 - 10/04 0700 In: 648 [P.O.:240; I.V.:408] Out: -  Intake/Output this shift: No intake/output data recorded.   Lab Results: Recent Labs    04/03/21 0417 04/04/21 0054  WBC 7.8 9.2  HGB 9.7* 9.7*  PLT 197 212  MCV 83.4 84.6   BMET Recent Labs    04/03/21 0417  NA 133*  K 3.8  CL 102  CO2 22  GLUCOSE 104*  BUN 6  CREATININE 0.62  CALCIUM 8.8*   LFT Recent Labs    04/03/21 0417  PROT 7.4  ALBUMIN 2.3*  AST 175*  ALT 145*  ALKPHOS 359*  BILITOT 0.5   PT/INR Recent Labs    04/03/21 1043 04/04/21 0054  INR 1.1 0.9      Imaging/Other results: No results found.    Assessment and Plan:  30 year old female with history of recurrent thromboses, admitted with leg pain possibly felt to be related to persistent iliac DVT, with vague abdominal discomfort, intermittent dark stools and a slight drop in hemoglobin on admission, found to have positive FOBT.  She has not had any evidence of GI bleeding this admission, and her hgb has been overall stable.  The patient warrants an endoscopic examination because of the  positive FOBT and slight drop in hgb, although nonurgent, it would be most facile and prudent to do these procedures before discharge   Positive FOBT - No evidence of active GI bleed currently - EGD/colonoscopy tomorrow - Clear liquid diet rest of day today, NPO after midnight except for bowel prep - Golytely to start this evening at 6pm.  Drink until stool is light yellow - Continue heparin gtt per protocol.   Elevated LAEs, mixed pattern, stable - Negative work up thus far, continue to monitor.   - Would repeat LAEs again prior to discharge to determine which direction they are trending    Jenel Lucks, MD  04/04/2021, 11:25 AM [facility]

## 2021-04-04 NOTE — Telephone Encounter (Signed)
Scheduled appt per 10/3 staff msg from Dr. Mosetta Putt. Pt is aware of appt date and time.

## 2021-04-05 ENCOUNTER — Encounter (HOSPITAL_COMMUNITY)
Admission: EM | Disposition: A | Discharge status: Home / Self Care | Payer: Self-pay | Source: Home / Self Care | Attending: Family Medicine

## 2021-04-05 ENCOUNTER — Inpatient Hospital Stay (HOSPITAL_COMMUNITY): Payer: 59 | Admitting: Anesthesiology

## 2021-04-05 ENCOUNTER — Encounter (HOSPITAL_COMMUNITY): Payer: Self-pay | Admitting: Internal Medicine

## 2021-04-05 DIAGNOSIS — M79605 Pain in left leg: Secondary | ICD-10-CM | POA: Diagnosis not present

## 2021-04-05 DIAGNOSIS — R195 Other fecal abnormalities: Secondary | ICD-10-CM | POA: Diagnosis present

## 2021-04-05 DIAGNOSIS — D649 Anemia, unspecified: Secondary | ICD-10-CM | POA: Diagnosis present

## 2021-04-05 HISTORY — PX: BIOPSY: SHX5522

## 2021-04-05 HISTORY — PX: COLONOSCOPY: SHX5424

## 2021-04-05 HISTORY — PX: ESOPHAGOGASTRODUODENOSCOPY: SHX5428

## 2021-04-05 LAB — HEPATIC FUNCTION PANEL
ALT: 79 U/L — ABNORMAL HIGH (ref 0–44)
AST: 37 U/L (ref 15–41)
Albumin: 2.3 g/dL — ABNORMAL LOW (ref 3.5–5.0)
Alkaline Phosphatase: 305 U/L — ABNORMAL HIGH (ref 38–126)
Bilirubin, Direct: <0.1 mg/dL (ref 0.0–0.2)
Total Bilirubin: 0.2 mg/dL — ABNORMAL LOW (ref 0.3–1.2)
Total Protein: 7.6 g/dL (ref 6.5–8.1)

## 2021-04-05 LAB — CBC
HCT: 33.1 % — ABNORMAL LOW (ref 36.0–46.0)
Hemoglobin: 10.3 g/dL — ABNORMAL LOW (ref 12.0–15.0)
MCH: 26.1 pg (ref 26.0–34.0)
MCHC: 31.1 g/dL (ref 30.0–36.0)
MCV: 83.8 fL (ref 80.0–100.0)
Platelets: 219 10*3/uL (ref 150–400)
RBC: 3.95 MIL/uL (ref 3.87–5.11)
RDW: 14 % (ref 11.5–15.5)
WBC: 9 10*3/uL (ref 4.0–10.5)
nRBC: 0 % (ref 0.0–0.2)

## 2021-04-05 LAB — APTT: aPTT: 71 seconds — ABNORMAL HIGH (ref 24–36)

## 2021-04-05 LAB — PROTIME-INR
INR: 1.1 (ref 0.8–1.2)
Prothrombin Time: 13.9 seconds (ref 11.4–15.2)

## 2021-04-05 LAB — HEPARIN LEVEL (UNFRACTIONATED): Heparin Unfractionated: 0.54 IU/mL (ref 0.30–0.70)

## 2021-04-05 SURGERY — EGD (ESOPHAGOGASTRODUODENOSCOPY)
Anesthesia: Monitor Anesthesia Care

## 2021-04-05 MED ORDER — SODIUM CHLORIDE 0.9 % IV SOLN: INTRAVENOUS | Status: DC

## 2021-04-05 MED ORDER — PROPOFOL 10 MG/ML IV BOLUS
INTRAVENOUS | Status: DC | PRN
  Administered 2021-04-05: 20 mg via INTRAVENOUS
  Administered 2021-04-05: 30 mg via INTRAVENOUS

## 2021-04-05 MED ORDER — ONDANSETRON HCL 4 MG/2ML IJ SOLN
4.0000 mg | Freq: Four times a day (QID) | INTRAMUSCULAR | Status: DC | PRN
  Administered 2021-04-05: 4 mg via INTRAVENOUS
  Filled 2021-04-05: qty 2

## 2021-04-05 MED ORDER — LIDOCAINE HCL (CARDIAC) PF 100 MG/5ML IV SOSY
PREFILLED_SYRINGE | INTRAVENOUS | Status: DC | PRN
Start: 2021-04-05 — End: 2021-04-05
  Administered 2021-04-05: 20 mg via INTRAVENOUS

## 2021-04-05 MED ORDER — LACTATED RINGERS IV SOLN
INTRAVENOUS | Status: AC | PRN
  Administered 2021-04-05: 1000 mL via INTRAVENOUS

## 2021-04-05 MED ORDER — WARFARIN SODIUM 7.5 MG PO TABS
7.5000 mg | ORAL_TABLET | Freq: Once | ORAL | Status: AC
  Administered 2021-04-05: 7.5 mg via ORAL
  Filled 2021-04-05: qty 1

## 2021-04-05 MED ORDER — PROPOFOL 500 MG/50ML IV EMUL
INTRAVENOUS | Status: DC | PRN
  Administered 2021-04-05: 225 ug/kg/min via INTRAVENOUS

## 2021-04-05 NOTE — Op Note (Signed)
[facility] Patient Name: Andrea Hayes Procedure Date : 04/05/2021 MRN: [id number] Attending MD: Dub Amis. Tomasa Rand , MD Date of Birth: July 26, 1990 CSN: [id number] Age: 30 Admit Type: Inpatient Procedure:                Upper GI endoscopy Indications:              Iron deficiency anemia, Occult blood in stool Providers:                Lorin Picket E. Tomasa Rand, MD, Adolph Pollack, RN,                            Lorenza Evangelist, RN, Leanne Lovely, Technician Referring MD:              Medicines:                Monitored Anesthesia Care Complications:            No immediate complications. Estimated Blood Loss:     Estimated blood loss was minimal. Procedure:                Pre-Anesthesia Assessment:                           - Prior to the procedure, a History and Physical                            was performed, and patient medications and                            allergies were reviewed. The patient's tolerance of                            previous anesthesia was also reviewed. The risks                            and benefits of the procedure and the sedation                            options and risks were discussed with the patient.                            All questions were answered, and informed consent                            was obtained. Prior Anticoagulants: The patient has                            taken heparin. ASA Grade Assessment: III - A                            patient with severe systemic disease. After                            reviewing the risks and benefits, the patient was  deemed in satisfactory condition to undergo the                            procedure.                           After obtaining informed consent, the endoscope was                            passed under direct vision. Throughout the                            procedure, the patient's blood pressure, pulse, and                             oxygen saturations were monitored continuously. The                            [id number]-[id number] ([id number]) Olympus endoscope was introduced                            through the mouth, and advanced to the third part                            of duodenum. The upper GI endoscopy was                            accomplished without difficulty. The patient                            tolerated the procedure well. Scope In: Scope Out: Findings:      The examined portions of the nasopharynx, oropharynx and larynx were       normal.      The examined esophagus was normal.      The entire examined stomach was normal. Biopsies were taken with a cold       forceps for Helicobacter pylori testing. Estimated blood loss was       minimal.      The examined duodenum was normal, but friable, as evidenced by scant       contact oozing Impression:               - The examined portions of the nasopharynx,                            oropharynx and larynx were normal.                           - Normal esophagus.                           - Normal stomach. Biopsied.                           - Normal examined duodenum.                           -  No abnormalities to explain patient's anemia or                            FOBT, although her duodenal mucosa was friable with                            scant contact oozing Recommendation:           - Return patient to hospital ward for ongoing care.                           - Resume regular diet.                           - Continue present medications.                           - Await pathology results. Procedure Code(s):        --- Professional ---                           [phone], Esophagogastroduodenoscopy, flexible,                            transoral; with biopsy, single or multiple Diagnosis Code(s):        --- Professional ---                           D50.9, Iron deficiency anemia, unspecified                           R19.5, Other fecal  abnormalities CPT copyright 2019 American Medical Association. All rights reserved. The codes documented in this report are preliminary and upon coder review may  be revised to meet current compliance requirements. Ladarian Bonczek E. Tomasa Rand, MD 04/05/2021 1:54:23 PM This report has been signed electronically. Number of Addenda: 0

## 2021-04-05 NOTE — Progress Notes (Signed)
  Progress Note    Andrea Hayes   ZOX:[id number]  DOB: 05/22/1991  DOA: 03/31/2021     3 Date of Service: 04/05/2021     Subjective:  No chest pain.  No nausea no vomiting.  No fever no chills.  Had a brown BM.  No further bleeding reported.  Reports diarrhea.  Hospital Problems Acute DVT of left lower extremity -Patient has extensive history of venous thromboembolism -S/p mechanical thrombectomy involving right common iliac, right external iliac and bilateral internal iliac veins during previous admission -Presented with left leg pain during this admission -Venous duplex of lower extremity showed deep vein thrombosis ;  left femoral vein, left posterior tibial veins -CT abdomen/pelvis during this admission showed no evidence of mesenteric ischemia, persistent but improved  thrombosis involving right common iliac, right external leg and bilateral internal iliac veins.  IVC thrombus has resolved. -Vascular surgery was consulted; no further intervention recommended for new DVT of left lower extremity   Anticoagulation -Consulted hematology/oncology; anticoagulation switched from Xarelto to IV heparin gtt. along with warfarin ;goal of INR between 2.5-3.5 -Patient to follow-up with hematology/oncology,  Andrea Hayes as outpatient Patient not willing to consider transitioning dose Lovenox for now.  Will monitor for now.     Melena -FOBT was positive -Patient also had epigastric pain -Hemoglobin dropped to 9.7 today EGD colonoscopy shows no evidence of acute bleeding.  Continue PPI.  GI signed off.  H&H stable.   Hypertension -Continue metoprolol   Idiopathic intracranial hypertension -Continue acetazolamide     History of cerebral vein thrombosis, pulmonary embolism -On anticoagulation as above   Transaminitis -LFT mildly elevated.  Monitor.  Outpatient follow-up with GI. -She has elevated A1 AT and ceruloplasmin, negative AMA/ANA/ASMA -We will follow GI  recommendations    Objective Vital signs were reviewed and unremarkable.  Vitals:   04/05/21 1350 04/05/21 1400 04/05/21 1410 04/05/21 1552  BP: (!) 103/57 106/62 120/81 109/81  Pulse: 80 79 73 81  Resp: 20 18 17 16   Temp:    97.8 F (36.6 C)  TempSrc:    Oral  SpO2: 100% 100% 100% 100%  Weight:      Height:       76.4 kg  Exam Physical Exam Constitutional:      Appearance: Normal appearance.  HENT:     Head: Normocephalic.     Mouth/Throat:     Mouth: Mucous membranes are moist.  Eyes:     Pupils: Pupils are equal, round, and reactive to light.  Cardiovascular:     Rate and Rhythm: Normal rate and regular rhythm.     Heart sounds: No murmur heard. Pulmonary:     Effort: Pulmonary effort is normal.     Breath sounds: Normal breath sounds.  Abdominal:     General: Bowel sounds are normal. There is no distension.     Palpations: Abdomen is soft.     Tenderness: There is no abdominal tenderness.  Musculoskeletal:        General: No swelling.  Skin:    General: Skin is warm.  Neurological:     General: No focal deficit present.     Mental Status: She is alert and oriented to person, place, and time.     Labs / Other Information My review of labs, imaging, notes and other tests shows no new significant findings.     Time spent: 73 [facility] 04/05/2021, 8:58 PM

## 2021-04-05 NOTE — Anesthesia Procedure Notes (Signed)
Procedure Name: MAC Date/Time: 04/05/2021 1:10 PM Performed by: Lieutenant Diego, CRNA Pre-anesthesia Checklist: Patient identified, Emergency Drugs available, Suction available, Patient being monitored and Timeout performed Patient Re-evaluated:Patient Re-evaluated prior to induction Oxygen Delivery Method: Nasal cannula Preoxygenation: Pre-oxygenation with 100% oxygen Induction Type: IV induction

## 2021-04-05 NOTE — Op Note (Signed)
[facility] Patient Name: Andrea Hayes Procedure Date : 04/05/2021 MRN: [id number] Attending MD: Dub Amis. Tomasa Rand , MD Date of Birth: [birth year] CSN: [id number] Age: 30 Admit Type: Inpatient Procedure:                Colonoscopy Indications:              Positive fecal immunochemical test Providers:                Lorin Picket E. Tomasa Rand, MD, Adolph Pollack, RN,                            Lorenza Evangelist, RN, Leanne Lovely, Technician,                            April C., CRNA Referring MD:              Medicines:                Monitored Anesthesia Care Complications:            No immediate complications. Estimated Blood Loss:     Estimated blood loss: none. Procedure:                Pre-Anesthesia Assessment:                           - Prior to the procedure, a History and Physical                            was performed, and patient medications and                            allergies were reviewed. The patient's tolerance of                            previous anesthesia was also reviewed. The risks                            and benefits of the procedure and the sedation                            options and risks were discussed with the patient.                            All questions were answered, and informed consent                            was obtained. Prior Anticoagulants: The patient has                            taken heparin. ASA Grade Assessment: III - A                            patient with severe systemic disease. After  reviewing the risks and benefits, the patient was                            deemed in satisfactory condition to undergo the                            procedure.                           After obtaining informed consent, the colonoscope                            was passed under direct vision. Throughout the                            procedure, the patient's blood pressure, pulse, and                             oxygen saturations were monitored continuously. The                            CF-HQ190L ([id number]) Olympus colonoscope was                            introduced through the anus and advanced to the the                            cecum, identified by appendiceal orifice and                            ileocecal valve. The colonoscopy was somewhat                            difficult due to significant looping. Successful                            completion of the procedure was aided by using                            manual pressure. The patient tolerated the                            procedure well. The quality of the bowel                            preparation was good. Scope In: 1:27:24 PM Scope Out: 1:43:37 PM Scope Withdrawal Time: 0 hours 10 minutes 12 seconds  Total Procedure Duration: 0 hours 16 minutes 13 seconds  Findings:      The perianal and digital rectal examinations were normal. Pertinent       negatives include normal sphincter tone and no palpable rectal lesions.      The colon (entire examined portion) appeared normal.      Non-bleeding internal hemorrhoids were found during retroflexion.      No additional abnormalities were found on retroflexion. Impression:               -  The entire examined colon is normal.                           - Non-bleeding internal hemorrhoids. This is the                            likely source of the patient's positive fecal                            occult blood test.                           - No specimens collected. Recommendation:           - Return patient to hospital ward for ongoing care.                           - Resume previous diet.                           - Would not recommend further endoscopic evaluation                            of positive FOBT and mild anemia at this time.                           - Repeat colonoscopy at age 41 for colon cancer                             screening. Procedure Code(s):        --- Professional ---                           [phone], Colonoscopy, flexible; diagnostic, including                            collection of specimen(s) by brushing or washing,                            when performed (separate procedure) Diagnosis Code(s):        --- Professional ---                           K64.8, Other hemorrhoids                           R19.5, Other fecal abnormalities CPT copyright 2019 American Medical Association. All rights reserved. The codes documented in this report are preliminary and upon coder review may  be revised to meet current compliance requirements. Abdulahad Mederos E. Tomasa Rand, MD 04/05/2021 2:02:31 PM This report has been signed electronically. Number of Addenda: 0

## 2021-04-05 NOTE — Anesthesia Postprocedure Evaluation (Signed)
Anesthesia Post Note  Patient: Andrea Hayes  Procedure(s) Performed: ESOPHAGOGASTRODUODENOSCOPY (EGD) COLONOSCOPY BIOPSY     Patient location during evaluation: Endoscopy Anesthesia Type: MAC Level of consciousness: awake and alert Pain management: pain level controlled Vital Signs Assessment: post-procedure vital signs reviewed and stable Respiratory status: spontaneous breathing, nonlabored ventilation and respiratory function stable Cardiovascular status: stable and blood pressure returned to baseline Postop Assessment: no apparent nausea or vomiting Anesthetic complications: no   No notable events documented.  Last Vitals:  Vitals:   04/05/21 1400 04/05/21 1410  BP: 106/62 120/81  Pulse: 79 73  Resp: 18 17  Temp:    SpO2: 100% 100%    Last Pain:  Vitals:   04/05/21 1410  TempSrc:   PainSc: 0-No pain                 Catalina Gravel

## 2021-04-05 NOTE — Transfer of Care (Signed)
Immediate Anesthesia Transfer of Care Note  Patient: Andrea Hayes  Procedure(s) Performed: ESOPHAGOGASTRODUODENOSCOPY (EGD) COLONOSCOPY BIOPSY  Patient Location: Endoscopy Unit  Anesthesia Type:MAC  Level of Consciousness: drowsy  Airway & Oxygen Therapy: Patient Spontanous Breathing and Patient connected to nasal cannula oxygen  Post-op Assessment: Report given to RN and Post -op Vital signs reviewed and stable  Post vital signs: Reviewed and stable  Last Vitals:  Vitals Value Taken Time  BP 103/57 04/05/21 1348  Temp    Pulse 81 04/05/21 1349  Resp 21 04/05/21 1349  SpO2 100 % 04/05/21 1349  Vitals shown include unvalidated device data.  Last Pain:  Vitals:   04/05/21 1215  TempSrc: Temporal  PainSc: 0-No pain         Complications: No notable events documented.

## 2021-04-05 NOTE — Interval H&P Note (Signed)
History and Physical Interval Note:  No changes in patient's status overnight.  Hgb increased to 10.3.  On heparin gtt but INR still 1.1 this morning.  No overt GI bleeding.  Patient able to complete bowel prep this morning.  04/05/2021 1:10 PM  Andrea Hayes  has presented today for surgery, with the diagnosis of FOBT positive, anemia.  The various methods of treatment have been discussed with the patient and family. After consideration of risks, benefits and other options for treatment, the patient has consented to  Procedure(s): ESOPHAGOGASTRODUODENOSCOPY (EGD) (N/A) COLONOSCOPY (N/A) as a surgical intervention.  The patient's history has been reviewed, patient examined, no change in status, stable for surgery.  I have reviewed the patient's chart and labs.  Questions were answered to the patient's satisfaction.     Jenel Lucks

## 2021-04-05 NOTE — Anesthesia Preprocedure Evaluation (Addendum)
Anesthesia Evaluation  Patient identified by MRN, date of birth, ID band  Reviewed: Allergy & Precautions, NPO status , Patient's Chart, lab work & pertinent test results  History of Anesthesia Complications Negative for: history of anesthetic complications  Airway Mallampati: III  TM Distance: >3 FB Neck ROM: Full    Dental  (+) Teeth Intact, Dental Advisory Given   Pulmonary PE (on Xarelto)   Pulmonary exam normal breath sounds clear to auscultation       Cardiovascular hypertension, Normal cardiovascular exam Rhythm:Regular Rate:Normal     Neuro/Psych  Headaches, cerebral venous thrombosis, idiopathic intracranial hypertension   CVA negative psych ROS   GI/Hepatic negative GI ROS, Neg liver ROS,   Endo/Other  negative endocrine ROS  Renal/GU Na 133  negative genitourinary   Musculoskeletal negative musculoskeletal ROS (+)   Abdominal   Peds  Hematology  (+) Blood dyscrasia, anemia , Hgb 10.3   Anesthesia Other Findings Day of surgery medications reviewed with patient.  Reproductive/Obstetrics negative OB ROS                            Anesthesia Physical Anesthesia Plan  ASA: 3  Anesthesia Plan: MAC   Post-op Pain Management:    Induction: Intravenous  PONV Risk Score and Plan: 2 and Propofol infusion and Treatment may vary due to age or medical condition  Airway Management Planned: Nasal Cannula  Additional Equipment:   Intra-op Plan:   Post-operative Plan:   Informed Consent: I have reviewed the patients History and Physical, chart, labs and discussed the procedure including the risks, benefits and alternatives for the proposed anesthesia with the patient or authorized representative who has indicated his/her understanding and acceptance.     Dental advisory given  Plan Discussed with: CRNA and Anesthesiologist  Anesthesia Plan Comments:         Anesthesia  Quick Evaluation

## 2021-04-05 NOTE — Progress Notes (Addendum)
ANTICOAGULATION CONSULT NOTE - Follow Up Consult  Pharmacy Consult for Heparin, Coumadin Indication:  PE, DVT  No Known Allergies  Patient Measurements: Height: 5\' 5"  (165.1 cm) Weight: 76.4 kg (168 lb 6.9 oz) IBW/kg (Calculated) : 57 Heparin Dosing Weight:    Vital Signs: Temp: 97.9 F (36.6 C) (10/05 0750) Temp Source: Oral (10/05 0750) BP: 109/80 (10/05 0750) Pulse Rate: 80 (10/05 0750)  Labs: Recent Labs    04/03/21 0417 04/03/21 1043 04/03/21 2034 04/04/21 0054 04/04/21 0803 04/05/21 0137  HGB 9.7*  --   --  9.7*  --  10.3*  HCT 31.2*  --   --  31.9*  --  33.1*  PLT 197  --   --  212  --  219  APTT 43* 47* 65*  --  71* 71*  LABPROT  --  14.1  --  12.4  --  13.9  INR  --  1.1  --  0.9  --  1.1  HEPARINUNFRC 0.70  --   --   --  0.55 0.54  CREATININE 0.62  --   --   --   --   --     Estimated Creatinine Clearance: 105.2 mL/min (by C-G formula based on SCr of 0.62 mg/dL).   Assessment: Anticoag: Xarelto (9/24-10/1) for recent PE and other recent complex coagulation history including DVTs. 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 aPTT 71, HL 0.54, Hgb 10.3, Plts WNL. INR 1.  Goal of Therapy:  aPTT 66-102 Hep level 0.3-0.7 INR 2.5-3.5 per heme/onc Monitor platelets by anticoagulation protocol: Yes   Plan:  Continue IV heparin at 1600 units/hr Warfarin 7.5 mg x1  Daily HL, CBC, INR   Naif Alabi S. 11-06-1974, PharmD, BCPS Clinical Staff Pharmacist [email or web address]  Merilynn Finland, Merilynn Finland 04/05/2021,9:11 AM

## 2021-04-06 ENCOUNTER — Encounter (HOSPITAL_COMMUNITY): Payer: Self-pay | Admitting: Gastroenterology

## 2021-04-06 LAB — APTT: aPTT: 124 seconds — ABNORMAL HIGH (ref 24–36)

## 2021-04-06 LAB — CBC
HCT: 32.7 % — ABNORMAL LOW (ref 36.0–46.0)
Hemoglobin: 10.2 g/dL — ABNORMAL LOW (ref 12.0–15.0)
MCH: 26.1 pg (ref 26.0–34.0)
MCHC: 31.2 g/dL (ref 30.0–36.0)
MCV: 83.6 fL (ref 80.0–100.0)
Platelets: 212 10*3/uL (ref 150–400)
RBC: 3.91 MIL/uL (ref 3.87–5.11)
RDW: 14.2 % (ref 11.5–15.5)
WBC: 9 10*3/uL (ref 4.0–10.5)
nRBC: 0 % (ref 0.0–0.2)

## 2021-04-06 LAB — SURGICAL PATHOLOGY

## 2021-04-06 LAB — PROTIME-INR
INR: 1.2 (ref 0.8–1.2)
Prothrombin Time: 14.7 seconds (ref 11.4–15.2)

## 2021-04-06 LAB — HEPARIN LEVEL (UNFRACTIONATED): Heparin Unfractionated: 0.68 IU/mL (ref 0.30–0.70)

## 2021-04-06 MED ORDER — WARFARIN SODIUM 5 MG PO TABS
10.0000 mg | ORAL_TABLET | Freq: Once | ORAL | Status: AC
  Administered 2021-04-06: 10 mg via ORAL
  Filled 2021-04-06: qty 2

## 2021-04-06 NOTE — Discharge Instructions (Signed)

## 2021-04-06 NOTE — Progress Notes (Signed)
  Progress Note    Andrea Hayes   BWL:[id number]  DOB: Aug 11, 1990  DOA: 03/31/2021     4 Date of Service: 04/06/2021     Subjective:  Reports more pain which is currently controlled.  No nausea no vomiting with no fever no chills.  No dizziness lightheadedness.  No bleeding reported.  No BM so far.  Hospital Problems Acute DVT of left lower extremity -Patient has extensive history of venous thromboembolism -S/p mechanical thrombectomy involving right common iliac, right external iliac and bilateral internal iliac veins during previous admission -Presented with left leg pain during this admission -Venous duplex of lower extremity showed deep vein thrombosis ;  left femoral vein, left posterior tibial veins -CT abdomen/pelvis during this admission showed no evidence of mesenteric ischemia, persistent but improved  thrombosis involving right common iliac, right external leg and bilateral internal iliac veins.  IVC thrombus has resolved. -Vascular surgery was consulted; no further intervention recommended for new DVT of left lower extremity   Anticoagulation -Consulted hematology/oncology; anticoagulation switched from Xarelto to IV heparin gtt. along with warfarin ;goal of INR between 2.5-3.5 -Patient to follow-up with hematology/oncology,  Dr. Mosetta Putt as outpatient INR still subtherapeutic. Patient not willing to consider transitioning Lovenox for now.  Will monitor for now.   Melena -FOBT was positive -Patient also had epigastric pain -Hemoglobin dropped to 9.7 today EGD colonoscopy shows no evidence of acute bleeding.  Continue PPI.  GI signed off.  H&H stable.   Hypertension -Continue metoprolol   Idiopathic intracranial hypertension -Continue acetazolamide     History of cerebral vein thrombosis, pulmonary embolism -On anticoagulation as above   Transaminitis -LFT mildly elevated.   -She has elevated A1 AT and ceruloplasmin, negative AMA/ANA/ASMA.  Outpatient  follow-up.    Objective Vital signs were reviewed and unremarkable.  Vitals:   04/06/21 0837 04/06/21 1200 04/06/21 1248 04/06/21 1545  BP: 102/66 99/65  102/70  Pulse: 80  70 76  Resp: 20   20  Temp: 97.7 F (36.5 C)   97.8 F (36.6 C)  TempSrc: Oral   Oral  SpO2: 100%   100%  Weight:      Height:       69.8 kg  Exam Physical Exam Constitutional:      Appearance: She is normal weight.  HENT:     Mouth/Throat:     Mouth: Mucous membranes are moist.     Pharynx: Oropharynx is clear.  Eyes:     Extraocular Movements: Extraocular movements intact.  Cardiovascular:     Rate and Rhythm: Normal rate and regular rhythm.     Heart sounds: No murmur heard. Pulmonary:     Effort: Pulmonary effort is normal. No respiratory distress.     Breath sounds: Normal breath sounds.  Abdominal:     General: Bowel sounds are normal.     Palpations: Abdomen is soft.  Musculoskeletal:        General: No swelling.  Neurological:     Mental Status: She is alert.     Labs / Other Information My review of labs, imaging, notes and other tests shows no new significant findings.     Time spent: 35 minutes  [facility] 04/06/2021, 7:16 PM

## 2021-04-06 NOTE — Progress Notes (Signed)
ANTICOAGULATION CONSULT NOTE - Follow Up Consult  Pharmacy Consult for Heparin, Coumadin Indication:  PE, DVT  No Known Allergies  Patient Measurements: Height: 5\' 5"  (165.1 cm) Weight: 69.8 kg (153 lb 12.8 oz) IBW/kg (Calculated) : 57 Heparin Dosing Weight:    Vital Signs: Temp: 98.5 F (36.9 C) (10/06 0501) Temp Source: Oral (10/06 0501) BP: 98/64 (10/06 0501) Pulse Rate: 82 (10/06 0501)  Labs: Recent Labs    04/04/21 0054 04/04/21 0803 04/05/21 0137 04/06/21 0146  HGB 9.7*  --  10.3* 10.2*  HCT 31.9*  --  33.1* 32.7*  PLT 212  --  219 212  APTT  --  71* 71* 124*  LABPROT 12.4  --  13.9 14.7  INR 0.9  --  1.1 1.2  HEPARINUNFRC  --  0.55 0.54 0.68     Estimated Creatinine Clearance: 100.8 mL/min (by C-G formula based on SCr of 0.62 mg/dL).   Assessment: Anticoag: Xarelto (9/24-10/1) for recent PE and other recent complex coagulation history including DVTs. 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 aPTT 124, HL 0.68, Hgb 10.2, Plts WNL. INR 1.2. Normal EGD/colonoscopy 10/5.  Goal of Therapy:  Hep level 0.3-0.7 INR 2.5-3.5 per heme/onc Monitor platelets by anticoagulation protocol: Yes   Plan:  Continue IV heparin at 1600 units/hr Warfarin 10 mg x1  D/c daily aPTTs, Con't daily HL, CBC, INR   Zonnie Landen S. 11-06-1974, PharmD, BCPS Clinical Staff Pharmacist [email or web address]  Merilynn Finland, Merilynn Finland 04/06/2021,8:34 AM

## 2021-04-07 ENCOUNTER — Other Ambulatory Visit (HOSPITAL_COMMUNITY): Payer: Self-pay

## 2021-04-07 ENCOUNTER — Other Ambulatory Visit: Payer: Self-pay | Admitting: *Deleted

## 2021-04-07 LAB — CBC
HCT: 30.3 % — ABNORMAL LOW (ref 36.0–46.0)
Hemoglobin: 9.6 g/dL — ABNORMAL LOW (ref 12.0–15.0)
MCH: 26.4 pg (ref 26.0–34.0)
MCHC: 31.7 g/dL (ref 30.0–36.0)
MCV: 83.5 fL (ref 80.0–100.0)
Platelets: 187 10*3/uL (ref 150–400)
RBC: 3.63 MIL/uL — ABNORMAL LOW (ref 3.87–5.11)
RDW: 14.2 % (ref 11.5–15.5)
WBC: 7.7 10*3/uL (ref 4.0–10.5)
nRBC: 0 % (ref 0.0–0.2)

## 2021-04-07 LAB — PROTIME-INR
INR: 1.3 — ABNORMAL HIGH (ref 0.8–1.2)
Prothrombin Time: 16.6 seconds — ABNORMAL HIGH (ref 11.4–15.2)

## 2021-04-07 LAB — HEPARIN LEVEL (UNFRACTIONATED): Heparin Unfractionated: 0.56 IU/mL (ref 0.30–0.70)

## 2021-04-07 MED ORDER — ENOXAPARIN (LOVENOX) PATIENT EDUCATION KIT
PACK | Freq: Once | Status: AC
  Filled 2021-04-07: qty 1

## 2021-04-07 MED ORDER — BENZONATATE 100 MG PO CAPS
100.0000 mg | ORAL_CAPSULE | Freq: Three times a day (TID) | ORAL | Status: DC
  Administered 2021-04-07 – 2021-04-08 (×3): 100 mg via ORAL
  Filled 2021-04-07 (×3): qty 1

## 2021-04-07 MED ORDER — ENOXAPARIN SODIUM 120 MG/0.8ML IJ SOSY
110.0000 mg | PREFILLED_SYRINGE | INTRAMUSCULAR | Status: DC
  Administered 2021-04-07 – 2021-04-08 (×2): 110 mg via SUBCUTANEOUS
  Filled 2021-04-07 (×2): qty 0.74

## 2021-04-07 MED ORDER — ENOXAPARIN SODIUM 120 MG/0.8ML IJ SOSY
110.0000 mg | PREFILLED_SYRINGE | INTRAMUSCULAR | 0 refills | Status: DC
  Filled 2021-04-07: qty 5.6, 7d supply, fill #0

## 2021-04-07 MED ORDER — WARFARIN SODIUM 2.5 MG PO TABS
12.5000 mg | ORAL_TABLET | Freq: Once | ORAL | Status: AC
  Administered 2021-04-07: 12.5 mg via ORAL
  Filled 2021-04-07: qty 1

## 2021-04-07 NOTE — Patient Outreach (Signed)
[facility]) Care Management  04/07/2021  Andrea Hayes 16-Feb-1991 [id number]   [facility] Care coordination  Noted at scheduled time of follow up outreach that patient was hospitalized on 03/31/21 after going to the ED for the continued LLE anterior pain, melena Dx acute LLE DVT (left femoral,left posterior tibial veins, melena  Patient Active Problem List   Diagnosis Date Noted   Fecal occult blood test positive    Anemia    Lower extremity pain, anterior, left 04/01/2021   Abdominal pain 03/31/2021   DVT (deep venous thrombosis) (HCC) 03/22/2021   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   Acute respiratory failure with hypoxia (HCC) 01/26/2021   Chronic right arterial ischemic stroke, MCA (middle cerebral artery) 01/23/2021   Pulmonary embolism (HCC) 01/23/2021   Leukocytosis 01/23/2021   Thrombocytopenia (HCC) 01/23/2021   Elevated liver enzymes 01/23/2021   Chest pain 01/23/2021   Back pain 01/23/2021   Vaginal delivery 12/05/2020   Headache 11/28/2020   HTN (hypertension) 11/28/2020   Severe preeclampsia, delivered 11/04/2020   IUGR (intrauterine growth restriction) affecting care of mother 10/31/2020   Thrombocytopenia affecting pregnancy (HCC) 05/27/2020   Acute right arterial ischemic stroke, middle cerebral artery (MCA) (HCC)    Dural venous sinus thrombosis    Idiopathic intracranial hypertension    Cerebral venous thrombosis 07/24/2018    Plan RN CM will follow up with patient within the next 3-7 business days   Aella Ronda L. Noelle Penner, RN, BSN, CCM [facility] Telephonic Care Management Care Coordinator Office number [phone] Mobile number [phone]  Main [facility] number [phone] Fax number [phone]

## 2021-04-07 NOTE — Progress Notes (Signed)
Pt watched RN administer lovenox injection this afternoon--proper location and technique.  Pt successfully return demonstrated technique with empty subcutaneous needle x2 with RN on dayshift.  Encouraged pt to practice with nightshift RN and ask to administer with RN watching Saturday morning.

## 2021-04-07 NOTE — Care Management (Signed)
Entered benefits check 110mg /day for 5 day lovenox.   Nurse to teach injections.

## 2021-04-07 NOTE — Progress Notes (Signed)
  Progress Note    Andrea Hayes   YIR:[id number]  DOB: 1990-10-22  DOA: 03/31/2021     5 Date of Service: 04/07/2021     Subjective:  Pain well controlled.  No nausea no vomiting.  No fever no chills.  Reports cough and runny nose in the afternoon.  Hospital Problems Acute DVT of left lower extremity -Patient has extensive history of venous thromboembolism -S/p mechanical thrombectomy involving right common iliac, right external iliac and bilateral internal iliac veins during previous admission -Presented with left leg pain during this admission -Venous duplex of lower extremity showed deep vein thrombosis ;  left femoral vein, left posterior tibial veins -CT abdomen/pelvis during this admission showed no evidence of mesenteric ischemia, persistent but improved  thrombosis involving right common iliac, right external leg and bilateral internal iliac veins.  IVC thrombus has resolved. -Vascular surgery was consulted; no further intervention recommended for new DVT of left lower extremity   Anticoagulation -Consulted hematology/oncology; anticoagulation switched from Xarelto to IV heparin gtt. along with warfarin ;goal of INR between 2.5-3.5 -Patient to follow-up with hematology/oncology,  Dr. Mosetta Putt as outpatient INR still subtherapeutic. Currently agreeable to transition to Lovenox.  Will monitor.   Melena -FOBT was positive -Patient also had epigastric pain -Hemoglobin dropped to 9.7 today EGD colonoscopy shows no evidence of acute bleeding.  Continue PPI.  GI signed off.  H&H stable.   Hypertension -Continue metoprolol   Idiopathic intracranial hypertension -Continue acetazolamide   History of cerebral vein thrombosis, pulmonary embolism -On anticoagulation as above   Transaminitis -LFT mildly elevated.   -She has elevated A1 AT and ceruloplasmin, negative AMA/ANA/ASMA.  Outpatient follow-up.  Cough and runny nose. Patient is concerned regarding COVID.  Will test.   Clinically I do not think that the patient is suffering from COVID based on 1 day of cough and runny nose.    Objective Vital signs were reviewed and unremarkable.  Vitals:   04/07/21 0506 04/07/21 0650 04/07/21 0754 04/07/21 1628  BP: 108/69  104/68 99/62  Pulse: 80  79 88  Resp:   18 16  Temp: 97.7 F (36.5 C)  98.3 F (36.8 C) 97.6 F (36.4 C)  TempSrc: Oral  Oral Oral  SpO2: 100%  100% 99%  Weight:  72.1 kg    Height:       72.1 kg  Exam Physical Exam Constitutional:      Appearance: She is normal weight.  HENT:     Mouth/Throat:     Mouth: Mucous membranes are moist.     Pharynx: Oropharynx is clear.  Eyes:     Extraocular Movements: Extraocular movements intact.  Cardiovascular:     Rate and Rhythm: Normal rate and regular rhythm.     Heart sounds: No murmur heard. Pulmonary:     Effort: Pulmonary effort is normal. No respiratory distress.     Breath sounds: Normal breath sounds.  Abdominal:     General: Bowel sounds are normal.     Palpations: Abdomen is soft.  Musculoskeletal:        General: No swelling.  Neurological:     Mental Status: She is alert.     Labs / Other Information My review of labs, imaging, notes and other tests shows no new significant findings.     Time spent: 35 minutes  [facility] 04/07/2021, 8:15 PM

## 2021-04-07 NOTE — TOC Benefit Eligibility Note (Signed)
Patient Product/process development scientist completed.    The patient is currently admitted and upon discharge could be taking enoxaparin (Lovenox) 120 mg/0.8 ml injection.  The current 30 day co-pay is, $0.00.   The patient is insured through [facility]     Roland Earl, CPhT Pharmacy Patient Advocate Specialist [facility] Health Antimicrobial Stewardship Team Direct Number: [phone]  Fax: [phone]

## 2021-04-07 NOTE — TOC Benefit Eligibility Note (Signed)
Transition of Care [facility]) Benefit Eligibility Note    Patient Details  Name: [name] MRN: [id number] Date of Birth: [birth year]   Medication/Dose: 5 days no covered without Prior Auth  Covered?: No  Tier:  (NA)  Prescription Coverage Preferred Pharmacy: Any Retail  Spoke with Person/Company/Phone Number:: Tosha/ [facility] / [phone]  Co-Pay: NA  Prior Approval: Yes [phone])  Deductible:  (NA)  Additional Notes: A Alterntive is Enoxaparin 5day supply 100 mcg has no copayment    Dorena Bodo Phone Number: 04/07/2021, 11:03 AM

## 2021-04-07 NOTE — Plan of Care (Signed)
  Problem: Education: Goal: Knowledge of General Education information will improve Description Including pain rating scale, medication(s)/side effects and non-pharmacologic comfort measures Outcome: Progressing   

## 2021-04-07 NOTE — Progress Notes (Addendum)
ANTICOAGULATION CONSULT NOTE - Follow Up Consult  Pharmacy Consult for Heparin, Coumadin>>Lovenox Indication:  PE, DVT  No Known Allergies  Patient Measurements: Height: 5\' 5"  (165.1 cm) Weight: 72.1 kg (158 lb 15.2 oz) IBW/kg (Calculated) : 57 Heparin Dosing Weight:    Vital Signs: Temp: 98.3 F (36.8 C) (10/07 0754) Temp Source: Oral (10/07 0754) BP: 104/68 (10/07 0754) Pulse Rate: 79 (10/07 0754)  Labs: Recent Labs    04/05/21 0137 04/06/21 0146 04/07/21 0133  HGB 10.3* 10.2* 9.6*  HCT 33.1* 32.7* 30.3*  PLT 219 212 187  APTT 71* 124*  --   LABPROT 13.9 14.7 16.6*  INR 1.1 1.2 1.3*  HEPARINUNFRC 0.54 0.68 0.56     Estimated Creatinine Clearance: 102.3 mL/min (by C-G formula based on SCr of 0.62 mg/dL).   Assessment: Anticoag: Xarelto (9/24-10/1) for recent PE and other recent complex coagulation history including DVTs. Transition to warfarin (goal INR 2.5-3.5), for hepatic vein thrombosis last month, Acute DVT of left lower extremity. aPTT   - HL 0.56, Hgb 9.6 down, Plts WNL. INR 1.3. Normal EGD/colonoscopy 10/5. Pt does not want Lovenox.  Goal of Therapy:  Hep level 0.3-0.7 INR 2.5-3.5 per heme/onc Monitor platelets by anticoagulation protocol: Yes   Plan:  Continue IV heparin at 1600 units/hr Warfarin 12.5 mg x1  Con't daily HL, CBC, INR  1030: d/c IV heparin. Start Lovenox 1.5mg /kg/d in preparation for discharge.   Taunja Brickner S. 11-06-1974, PharmD, BCPS Clinical Staff Pharmacist [email or web address]  Merilynn Finland, Merilynn Finland 04/07/2021,8:51 AM

## 2021-04-08 LAB — BASIC METABOLIC PANEL
Anion gap: 11 (ref 5–15)
BUN: 5 mg/dL — ABNORMAL LOW (ref 6–20)
CO2: 18 mmol/L — ABNORMAL LOW (ref 22–32)
Calcium: 9.4 mg/dL (ref 8.9–10.3)
Chloride: 106 mmol/L (ref 98–111)
Creatinine, Ser: 0.58 mg/dL (ref 0.44–1.00)
GFR, Estimated: >60 mL/min (ref >60)
Glucose, Bld: 95 mg/dL (ref 70–99)
Potassium: 3.9 mmol/L (ref 3.5–5.1)
Sodium: 135 mmol/L (ref 135–145)

## 2021-04-08 LAB — PROTIME-INR
INR: 1.7 — ABNORMAL HIGH (ref 0.8–1.2)
Prothrombin Time: 20.1 seconds — ABNORMAL HIGH (ref 11.4–15.2)

## 2021-04-08 LAB — SARS CORONAVIRUS 2 (TAT 6-24 HRS): SARS Coronavirus 2: NEGATIVE

## 2021-04-08 LAB — CBC
HCT: 34.9 % — ABNORMAL LOW (ref 36.0–46.0)
Hemoglobin: 10.6 g/dL — ABNORMAL LOW (ref 12.0–15.0)
MCH: 25.9 pg — ABNORMAL LOW (ref 26.0–34.0)
MCHC: 30.4 g/dL (ref 30.0–36.0)
MCV: 85.1 fL (ref 80.0–100.0)
Platelets: 181 10*3/uL (ref 150–400)
RBC: 4.1 MIL/uL (ref 3.87–5.11)
RDW: 14.4 % (ref 11.5–15.5)
WBC: 6.1 10*3/uL (ref 4.0–10.5)
nRBC: 0 % (ref 0.0–0.2)

## 2021-04-08 MED ORDER — POLYETHYLENE GLYCOL 3350 17 G PO PACK: 17.0000 g | PACK | Freq: Every day | ORAL | 0 refills | Status: DC

## 2021-04-08 MED ORDER — BENZONATATE 100 MG PO CAPS: 100.0000 mg | ORAL_CAPSULE | Freq: Three times a day (TID) | ORAL | 0 refills | Status: DC

## 2021-04-08 MED ORDER — WARFARIN SODIUM 7.5 MG PO TABS
7.5000 mg | ORAL_TABLET | Freq: Once | ORAL | 0 refills | Status: DC
Start: 2021-04-08 — End: 2021-04-13

## 2021-04-08 MED ORDER — DOCUSATE SODIUM 100 MG PO CAPS: 100.0000 mg | ORAL_CAPSULE | Freq: Every day | ORAL | 0 refills | Status: DC

## 2021-04-08 MED ORDER — WARFARIN SODIUM 7.5 MG PO TABS
7.5000 mg | ORAL_TABLET | Freq: Once | ORAL | Status: DC
  Filled 2021-04-08: qty 1

## 2021-04-08 MED ORDER — HYDROCODONE-ACETAMINOPHEN 5-325 MG PO TABS: 1.0000 | ORAL_TABLET | Freq: Three times a day (TID) | ORAL | 0 refills | Status: DC | PRN

## 2021-04-08 NOTE — Progress Notes (Signed)
Patient administered Lovenox to self with supervision. She showed understanding to proper dosage and proper technique for  self SQ injection.

## 2021-04-08 NOTE — Plan of Care (Signed)
Will discharge ?

## 2021-04-08 NOTE — Progress Notes (Signed)
ANTICOAGULATION CONSULT NOTE - Follow Up Consult  Pharmacy Consult for Heparin, Coumadin>>Lovenox, coumadin Indication:  PE, DVT  No Known Allergies  Patient Measurements: Height: 5\' 5"  (165.1 cm) Weight: 71.3 kg (157 lb 1.6 oz) IBW/kg (Calculated) : 57 Heparin Dosing Weight:    Vital Signs: Temp: 98 F (36.7 C) (10/08 0848) Temp Source: Oral (10/08 0848) BP: 101/68 (10/08 0848) Pulse Rate: 78 (10/08 0848)  Labs: Recent Labs    04/06/21 0146 04/07/21 0133 04/08/21 0805  HGB 10.2* 9.6* 10.6*  HCT 32.7* 30.3* 34.9*  PLT 212 187 181  APTT 124*  --   --   LABPROT 14.7 16.6* 20.1*  INR 1.2 1.3* 1.7*  HEPARINUNFRC 0.68 0.56  --   CREATININE  --   --  0.58     Estimated Creatinine Clearance: 101.8 mL/min (by C-G formula based on SCr of 0.58 mg/dL).   Assessment: Anticoag: Xarelto (9/24-10/1) for recent PE and other recent complex coagulation history including DVTs. 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 Started Lovenox 1.5mg /kg/d in preparation for discharge.INR this AM 1.7 s/p 12.5 MG on 10/7. No signs of bleed noted, Hgb 10.6.   Goal of Therapy:  Hep level 0.3-0.7 INR 2.5-3.5 per heme/onc Monitor platelets by anticoagulation protocol: Yes   Plan:  Continue lovenox 1.5mg /kg/day  Warfarin 7.5 mg x1 today  Daily INR while inpatient  F/u plans for discharge   12/7, PharmD, [facility] Resident [phone] 04/08/2021 10:09 AM

## 2021-04-09 NOTE — Discharge Summary (Signed)
Physician Discharge Summary   Patient name: Andrea Hayes  Admit date:     03/31/2021  Discharge date: 04/09/2021  Attending Physician: Meredeth Ide [[id number]]  Discharge Physician: Lynden Oxford   PCP: Fleet Contras, MD    Follow-up Information     Fleet Contras, MD. Schedule an appointment as soon as possible for a visit in 1 week(s).   Specialty: Internal Medicine Contact information: [address] [place] [address] [phone]         [facility] [employer]. Go on 04/10/2021.   Specialty: Cardiology Why: call first for appointment and go for INR check up. Contact information: [address], [address] [place] [address] [phone]        Malachy Mood, MD Follow up in 3 week(s).   Specialties: Hematology, Oncology Why: with CBC and BMP Contact information: [address] [place] [address] [phone]                 Recommendations at discharge: Establish care with Coumadin clinic INR checkup on Monday..  Follow-up with PCP in 1 week.  Discharge Diagnoses Principal Problem:   Lower extremity pain, anterior, left Active Problems:   Abdominal pain   Fecal occult blood test positive   Anemia  Hospital Course   Acute DVT of left lower extremity -Patient has extensive history of venous thromboembolism -S/p mechanical thrombectomy involving right common iliac, right external iliac and bilateral internal iliac veins during previous admission -Presented with left leg pain during this admission -Venous duplex of lower extremity showed deep vein thrombosis ;  left femoral vein, left posterior tibial veins -CT abdomen/pelvis during this admission showed no evidence of mesenteric ischemia, persistent but improved  thrombosis involving right common iliac, right external leg and bilateral internal iliac veins.  IVC thrombus has resolved. -Vascular surgery was consulted; no further intervention recommended for new  DVT of left lower extremity   Anticoagulation -Consulted hematology/oncology; anticoagulation switched from Xarelto to IV heparin gtt. along with warfarin ;goal of INR between 2.5-3.5 -Patient to follow-up with hematology/oncology,  Dr. Mosetta Putt as outpatient INR still subtherapeutic. Currently agreeable to transition to Lovenox.  Referral placed for Coumadin clinic.  Dr. Mosetta Putt PCP will manage warfarin.   Melena -FOBT was positive -Patient also had epigastric pain -Hemoglobin dropped to 9.7 today EGD colonoscopy shows no evidence of acute bleeding.  Continue PPI.  GI signed off.  H&H stable.   Hypertension -Continue metoprolol   Idiopathic intracranial hypertension chronic headache -Continue acetazolamide   History of cerebral vein thrombosis, pulmonary embolism -On anticoagulation as above   Transaminitis -LFT mildly elevated.   -She has elevated A1 AT and ceruloplasmin, negative AMA/ANA/ASMA.  Outpatient follow-up.   Cough and runny nose. Patient is concerned regarding COVID.  COVID-negative. Symptoms resolved.  Procedures performed: None  Condition at discharge: good  Exam  General: Appear in mild distress, no Rash; Oral Mucosa Clear, moist. no Abnormal Neck Mass Or lumps, Conjunctiva normal  Cardiovascular: S1 and S2 Present, no Murmur, Respiratory: good respiratory effort, Bilateral Air entry present and CTA, no Crackles, no wheezes Abdomen: Bowel Sound present, Soft and no tenderness Extremities: no Pedal edema Neurology: alert and oriented to time, place, and person affect appropriate. no new focal deficit Gait not checked due to patient safety concerns   Disposition: Home  Discharge time: greater than 30 minutes. Allergies as of 04/08/2021   No Known Allergies      Medication List     STOP  taking these medications    acetaminophen 325 MG tablet Commonly known as: TYLENOL   rivaroxaban 20 MG Tabs tablet Commonly known as: XARELTO   Rivaroxaban Stater  Pack (15 mg and 20 mg) Commonly known as: XARELTO STARTER PACK       TAKE these medications    acetaZOLAMIDE 125 MG tablet Commonly known as: DIAMOX Take 2 tablets (250 mg total) by mouth 2 (two) times daily.   benzonatate 100 MG capsule Commonly known as: TESSALON Take 1 capsule (100 mg total) by mouth 3 (three) times daily.   docusate sodium 100 MG capsule Commonly known as: COLACE Take 1 capsule (100 mg total) by mouth at bedtime.   enoxaparin 120 MG/0.8ML injection Commonly known as: LOVENOX Inject 0.74 mLs (110 mg total) into the skin daily for 7 days.   ferrous fumarate-b12-vitamic C-folic acid capsule Commonly known as: TRINSICON / FOLTRIN Take 1 capsule by mouth 2 (two) times daily after a meal.   HYDROcodone-acetaminophen 5-325 MG tablet Commonly known as: NORCO/VICODIN Take 1 tablet by mouth 3 (three) times daily as needed for moderate pain or severe pain. What changed:  how much to take when to take this   metoprolol succinate 25 MG 24 hr tablet Commonly known as: TOPROL-XL Take 1 tablet (25 mg total) by mouth daily.   polyethylene glycol 17 g packet Commonly known as: MIRALAX / GLYCOLAX Take 17 g by mouth daily.   Prenatal Vitamin 27-0.8 MG Tabs Take 1 tablet by mouth daily.   Vitamin D (Ergocalciferol) 1.25 MG (50000 UNIT) Caps capsule Commonly known as: DRISDOL Take 50,000 Units by mouth every Wednesday.   warfarin 7.5 MG tablet Commonly known as: COUMADIN Take 1 tablet (7.5 mg total) by mouth one time only at 4 PM. Follow up with anticoagulation (coumadin) clinic and PCP for longer prescription.        CT Angio Chest Pulmonary Embolism (PE) W or WO Contrast  Result Date: 03/26/2021 CLINICAL DATA:  30 yoF with hx PE and cerebral thrombosis admitted with IVC thrombus. Pt is noncompliant with enoxaparin/warfarin PTA. EXAM: CT ANGIOGRAPHY CHEST WITH CONTRAST TECHNIQUE: Multidetector CT imaging of the chest was performed using the standard  protocol during bolus administration of intravenous contrast. Multiplanar CT image reconstructions and MIPs were obtained to evaluate the vascular anatomy. CONTRAST:  OMNIPAQUE IOHEXOL 350 MG/ML SOLN COMPARISON:  01/23/2021. FINDINGS: Cardiovascular: Pulmonary arteries are well opacified. Focal pulmonary embolus noted in the anterior segmental branch to the right upper lobe, not evident on the prior CT. This may reflect distal propagation of prior thrombus or a new embolus. Pulmonary emboli noted to the right lower lobe on the prior study have resolved. There is no other evidence of pulmonary emboli. Heart is normal in size and configuration. No pericardial effusion. Great vessels are normal in caliber. Aberrant right subclavian artery. No aortic dissection or atherosclerosis. Mediastinum/Nodes: Prominent left prevascular lymph node, 1.1 cm short axis. Prominent right axillary lymph nodes, largest 1 cm short axis. These findings are stable. No mediastinal or hilar masses. Trachea and esophagus are unremarkable. Lungs/Pleura: Trace pleural effusions. Minor dependent subsegmental atelectasis in the lower lobes, right lower lobe atelectasis significantly improved from the prior CT. Lung volumes are low, study acquired in expiration. No evidence of pneumonia or pulmonary edema. No pneumothorax. Upper Abdomen: Unremarkable. Musculoskeletal: Normal. Review of the MIP images confirms the above findings. IMPRESSION: 1. Occlusive appearing pulmonary embolus in the anterior segmental pulmonary artery of the right upper lobe, not evident on the  prior CTA. 2. No other evidence of new pulmonary emboli. 3. Previously noted pulmonary emboli have resolved. 4. Significant interval improvement in right lower lobe atelectasis. Minimal atelectasis persisting in the dependent lung bases. Trace pleural effusions. Electronically Signed   By: Amie Portland M.D.   On: 03/26/2021 12:40   CT Abdomen Pelvis W Contrast  Result Date:  03/22/2021 CLINICAL DATA:  Abdominal infection suspected. EXAM: CT ABDOMEN AND PELVIS WITH CONTRAST TECHNIQUE: Multidetector CT imaging of the abdomen and pelvis was performed using the standard protocol following bolus administration of intravenous contrast. CONTRAST:  80mL OMNIPAQUE IOHEXOL 350 MG/ML SOLN COMPARISON:  CT abdomen and pelvis 07/13/2017. FINDINGS: Lower chest: There is a trace right pleural effusion. Hepatobiliary: No focal liver abnormality is seen. No gallstones, gallbladder wall thickening, or biliary dilatation. Pancreas: Unremarkable. No pancreatic ductal dilatation or surrounding inflammatory changes. Spleen: Normal in size without focal abnormality. Adrenals/Urinary Tract: Adrenal glands are unremarkable. Kidneys are normal, without renal calculi, focal lesion, or hydronephrosis. Bladder is unremarkable. Stomach/Bowel: Stomach is within normal limits. Appendix appears normal. No evidence of bowel wall thickening, distention, or inflammatory changes. Vascular/Lymphatic: The aorta appears within normal limits. There is occlusive thrombus throughout the entire infrarenal IVC. There is nonocclusive thrombus in the IVC at the level of the renal arteries and within the intrahepatic IVC. Opacification of the iliac veins is poor, but there are findings suspicious for occlusive thrombus within the right common and external iliac vein and possibly the left common iliac vein. There are prominent bilateral inguinal lymph nodes. Reproductive: Uterus is slightly prominent in size. There is a 2.8 cm cyst in the left ovary. Right adnexa is unremarkable. Other: There is presacral edema as well as diffuse retroperitoneal edema surrounding the IVC. There is no ascites or free intraperitoneal air. Subcutaneous densities in the anterior abdominal wall are likely related to medication injection sites. Musculoskeletal: No acute or significant osseous findings. IMPRESSION: 1. Occlusive thrombus throughout the  infrarenal IVC. Nonocclusive thrombus in the upper abdominal IVC. Findings highly suspicious for bilateral common iliac vein occlusive thrombus and right external iliac vein thrombus. 2. Presacral and retroperitoneal edema likely secondary to venous congestion. Infectious or inflammatory process cannot be excluded. 3. 2.8 cm left ovarian simple-appearing cyst. No follow-up imaging is recommended. Reference: JACR 2020 Feb;17(2):248-254 4. Trace right pleural effusion. 5. These results were called by telephone at the time of interpretation on 03/22/2021 at 10:01 pm to provider Dr. Hyacinth Meeker, who verbally acknowledged these results. Electronically Signed   By: Darliss Cheney M.D.   On: 03/22/2021 22:01   PERIPHERAL VASCULAR CATHETERIZATION  Result Date: 03/24/2021 Table formatting from the original result was not included. Images from the original result were not included. Signed                                                                          DATE OF SERVICE: 03/24/2021  PATIENT:  Andrea Hayes  30 y.o. female  PRE-OPERATIVE DIAGNOSIS:  IVC and bilateral iliac thrombus  POST-OPERATIVE DIAGNOSIS:  Same  PROCEDURE:  1) bilateral vena cava, common iliac, external iliac, common femoral, femoral vein intravascular ultrasound 2) bilateral iliofemoral venogram ( 3) bilateral vena cava, iliac, femoral endovascular thrombectomy (Inari ClotTriever) 4) right common  iliac vein angioplasty (14 x 60mm Atlas) 5) conscious sedation (86 minutes).  SURGEON:  Rande Brunt. Lenell Antu, MD  ASSISTANT: none  ANESTHESIA:   local and IV sedation  ESTIMATED BLOOD LOSS:  LOCAL MEDICATIONS USED:  LIDOCAINE  COUNTS: confirmed correct.  PATIENT DISPOSITION:  PACU - hemodynamically stable.  Delay start of Pharmacological VTE agent (>24hrs) due to surgical blood loss or risk of bleeding: no  INDICATION FOR PROCEDURE: Natha Frank is a 30 y.o. 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  Thrombolysis was  initiated last night.  The patient had symptomatic improvement today.  After careful discussion of risks, benefits, and alternatives the patient was offered catheter recheck and possible endovascular thrombectomy. The patient  understood and wished to proceed.  OPERATIVE FINDINGS: Chronic thrombus remained in the confluence of the iliac veins and bilateral common iliac veins.  This appeared nearly occlusive on venogram.  Multiple passes of the Inari ClotTriever cleared much of the thrombus out of the bilateral iliac veins and IVC.  Completion venography shows restoration of flow through the pelvis into the IVC.  DESCRIPTION OF PROCEDURE: After identification of the patient in the pre-operative holding area, the patient was transferred to the operating room. The patient was positioned supine on the operating room table.  Anesthesia was induced. The popliteal fossae were prepped and draped in standard fashion. A surgical pause was performed confirming correct patient, procedure, and operative location.  Amplatz wires were advanced through the thrombolysis catheters, and the catheters were removed.  The bilateral 5 [place] sheaths were upsized to 8 [place].  Amplatz wires were delivered into the right internal jugular vein.  Intravascular ultrasound was performed over the wire evaluating the perihepatic IVC, perirenal IVC, infrarenal IVC, confluence of the iliac veins and bilateral common iliac veins, bilateral external iliac veins, bilateral common femoral veins, and bilateral femoral veins.  This initial ultrasound revealed residual chronic thrombus in the confluence of the iliac veins and bilateral common iliac veins.  Bilateral lower extremity venograms were performed from the sheath.  This revealed patent veins below the inguinal ligaments.  Stagnation of flow was noted in the distal external iliac vein/common femoral vein bilaterally.  Patient was systemically heparinized. Endovascular thrombectomy was performed  bilaterally.  The Inari ClotTriever was used to perform thrombectomy.  6 passes were used in the left lower extremity; 3 passes were performed in the right lower extremity.  Significant clot was returned after the first pass in each limb.  Less clot returned thereafter. I repeated the IVUS and showed there was still some common iliac chronic thrombus bilaterally.  Venogram was performed.  This showed a near occlusive chronic thrombus in the left common iliac vein.  I performed an angioplasty of this with a 14 x 40 Atlas balloon.  Follow-up angiogram showed loose chronic thrombus.  I repeated an Inari clot Cherre Blanc pass with good return.  Follow-up venogram shows resolution of the lesion. The right lower extremity pelvis was imaged with venography.  This showed some chronic thrombus in the common iliac vein.  This is not flow-limiting.  I elected to end the case here.  The Inari sheaths were removed from the popliteal veins and a 3-0 nylon stitch with bolster used to achieve hemostasis bilaterally.  Sterile bandages were applied.  Legs were wrapped with Ace wraps.  Conscious sedation was administered with the use of IV fentanyl and midazolam under continuous physician and nurse monitoring.  Heart rate, blood pressure, and  oxygen saturation were continuously monitored.  Total sedation time was 86 minutes  Upon completion of the case instrument and sharps counts were confirmed correct. The patient was transferred to the PACU in good condition. I was present for all portions of the procedure.  PLAN: Resume therapeutic anticoagulation 2 hours postprocedure.  Patient needs a minimum of 6 months of anticoagulation from my standpoint.  Return to floor.  Rande Brunt. Lenell Antu, MD [facility] Phone Number: [phone] 03/24/2021 3:48 PM     PERIPHERAL VASCULAR CATHETERIZATION  Result Date: 03/23/2021 Images from the original result were not included. Patient name: Andrea Hayes MRN:  [id number] DOB: [birth year] Sex: female 03/23/2021 Pre-operative Diagnosis: IVC, bilateral common iliac, bilateral external iliac and bilateral common femoral vein DVT Post-operative diagnosis:  Same Surgeon:  Luanna Salk. Randie Heinz, MD Procedure Performed: 1.  Ultrasound-guided cannulation bilateral popliteal veins 2.  IVC venogram 3.  Placement of IVC bilateral common iliac vein, bilateral external iliac vein bilateral common femoral vein and bilateral femoral vein 50 cm treatment length UniFuse catheters 4.  Moderate sedation with fentanyl and Versed for 35 minutes Indications: 30 year old female presented with abdominal pain was found to have extensive DVT from her IVC extending down to her bilateral common femoral veins.  Also has bilateral lower extremity swelling.  She is indicated for lytic catheter placement. Findings: Bilateral popliteal veins were distended but they were compressible.  The did have bleeding when the micropuncture sheath was placed.  By central venogram there did appear to be thrombus extending up to the level of the hepatic veins.  There was difficulty placing a wire past the bilateral common iliac veins into the IVC confluence this required catheter direction.  Bilateral UniFuse catheters were placed that were 50 cm treatment length from the distal IVC down to the mid femoral veins.  Procedure:  The patient was identified in the holding area and taken to room 8.  The patient was then placed prone on the table and prepped and draped in the usual sterile fashion.  A time out was called.  Ultrasound was used to evaluate first the left popliteal vein.  This was large but was compressible.  There is no spasm facility again cannulated with micropuncture needle followed by wire and sheath.  We then placed a Glidewire.  A 5 French sheath was placed.  Fluoroscopically we advanced the Glidewire to the level of the left common iliac vein.  A Berenstein catheter was placed.  Was directed to the IVC.  At the  terminal IVC we performed bleeding venogram which demonstrated thrombus to the level of the hepatic veins.  We placed UniFuse catheter as well as the occluder wire.  We turned our attention to the right leg.  Ultrasound used to identify the popliteal vein on that side.  This also was enlarged although somewhat compressible.  The area again was anesthetized 1% lidocaine cannulated micropuncture needle followed by wire and sheath.  Glidewire was placed followed by 5 [place] sheath.  Again the Glidewire did hang up at the common iliac vein on the right.  We used Berenstein catheter directed the IVC and again perform IVC venogram at the terminal IVC to confirm intraluminal access.  The UniFuse catheter was placed with the occluded wire.  Single shot images were taken centrally and peripherally to demonstrate the placement of the catheter.  Both catheters were flushed.  They will be helped tPA.  She tolerated procedure without any complication. Contrast: 20 cc Apolinar Junes  C. Randie Heinz, MD [facility] Office: [phone] Pager: [phone]   VAS [place] LOWER EXTREMITY VENOUS (DVT)  Result Date: 04/02/2021  Lower Venous DVT Study Patient Name:  Andrea Hayes  Date of Exam:   04/01/2021 Medical Rec #: [id number]       Accession #:    [id number] Date of Birth: Aug 10, 1990       Patient Gender: F Patient Age:   29 years Exam Location:  [facility] Procedure:      VAS [place] LOWER EXTREMITY VENOUS (DVT) Referring Phys: Midge Minium --------------------------------------------------------------------------------  Indications: Pain, and Swelling.  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 Limitations: Patient unable to tolerate compression. Bandage behind knee, patient refused to let me remove it. Comparison Study: Prior negative bilateral lower extremity venous duplex done                   01/23/21 Performing  Technologist: Sherren Kerns RVS  Examination Guidelines: A complete evaluation includes B-mode imaging, spectral Doppler, color Doppler, and power Doppler as needed of all accessible portions of each vessel. Bilateral testing is considered an integral part of a complete examination. Limited examinations for reoccurring indications may be performed as noted. The reflux portion of the exam is performed with the patient in reverse Trendelenburg.  +-------+---------------+---------+-----------+----------+-----------------+ RIGHT  CompressibilityPhasicitySpontaneityPropertiesThrombus Aging    +-------+---------------+---------+-----------+----------+-----------------+ CFV    Partial        No       No                   Age Indeterminate +-------+---------------+---------+-----------+----------+-----------------+ FV ProxPartial        No       No                                     +-------+---------------+---------+-----------+----------+-----------------+ PFV    Partial        No       No                                     +-------+---------------+---------+-----------+----------+-----------------+   +---------+---------------+---------+-----------+----------+-------------------+ LEFT     CompressibilityPhasicitySpontaneityPropertiesThrombus Aging      +---------+---------------+---------+-----------+----------+-------------------+ CFV      Full           Yes      Yes                                      +---------+---------------+---------+-----------+----------+-------------------+ SFJ      Full                                                             +---------+---------------+---------+-----------+----------+-------------------+ FV Prox                 No       No                   Acute               +---------+---------------+---------+-----------+----------+-------------------+  FV Mid                  No       No                   Acute                +---------+---------------+---------+-----------+----------+-------------------+ FV Distal               No       No                   Acute               +---------+---------------+---------+-----------+----------+-------------------+ PFV                     Yes      Yes                  patent by color and                                                       Doppler             +---------+---------------+---------+-----------+----------+-------------------+ POP                                                   unable to visualize                                                       secondary to                                                              bandage             +---------+---------------+---------+-----------+----------+-------------------+ PTV      None           No       No                   Acute               +---------+---------------+---------+-----------+----------+-------------------+ PERO                                                  Not well visualized +---------+---------------+---------+-----------+----------+-------------------+     Summary: RIGHT: - Findings consistent with age indeterminate deep vein thrombosis involving the right common femoral vein, right femoral vein, and right proximal profunda vein. - Ultrasound characteristics of enlarged lymph nodes are noted in the groin.  LEFT: - Findings consistent with acute deep vein thrombosis involving the left femoral vein, and left posterior tibial veins. - Ultrasound characteristics of  enlarged lymph nodes noted in the groin.  *See table(s) above for measurements and observations. Electronically signed by Coral Else MD on 04/02/2021 at 9:31:45 AM.    Final    CT Angio Abd/Pel W and/or Wo Contrast  Result Date: 03/31/2021 CLINICAL DATA:  Acute mesenteric ischemia, history of IVC thrombosis and DVT EXAM: CTA ABDOMEN AND PELVIS WITHOUT AND WITH CONTRAST TECHNIQUE:  Multidetector CT imaging of the abdomen and pelvis was performed using the standard protocol during bolus administration of intravenous contrast. Multiplanar reconstructed images and MIPs were obtained and reviewed to evaluate the vascular anatomy. CONTRAST:  70mL OMNIPAQUE IOHEXOL 350 MG/ML SOLN COMPARISON:  03/22/2021 FINDINGS: VASCULAR Aorta: Normal caliber aorta without aneurysm, dissection, vasculitis or significant stenosis. Celiac: Patent without evidence of aneurysm, dissection, vasculitis or significant stenosis. SMA: Patent without evidence of aneurysm, dissection, vasculitis or significant stenosis. Renals: Both renal arteries are patent without evidence of aneurysm, dissection, vasculitis, fibromuscular dysplasia or significant stenosis. IMA: Patent without evidence of aneurysm, dissection, vasculitis or significant stenosis. Inflow: Patent without evidence of aneurysm, dissection, vasculitis or significant stenosis. Proximal Outflow: Bilateral common femoral and visualized portions of the superficial and profunda femoral arteries are patent without evidence of aneurysm, dissection, vasculitis or significant stenosis. 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 There is continued acute DVT involving the right common iliac, right external iliac, and bilateral internal iliac veins. Overall decreased clot burden since prior study. Review of the MIP images confirms the above findings. NON-VASCULAR Lower chest: No acute pleural or parenchymal lung disease. Hepatobiliary: No focal liver abnormality is seen. No gallstones, gallbladder wall thickening, or biliary dilatation. Pancreas: Unremarkable. No pancreatic ductal dilatation or surrounding inflammatory changes. Spleen: Normal in size without focal abnormality. Adrenals/Urinary Tract: Adrenal glands are unremarkable. Kidneys are normal, without renal calculi, focal lesion, or hydronephrosis. Bladder is unremarkable. Stomach/Bowel: No  bowel obstruction or ileus. Normal appendix right lower quadrant. No bowel wall thickening or inflammatory change. No evidence of mesenteric ischemia. Lymphatic: Stable borderline enlarged bilateral iliac chain lymph nodes may be reactive. No other pathologic adenopathy. Reproductive: 3.2 cm simple appearing left ovarian cyst. Right ovary and uterus are unremarkable. Other: Decreased presacral edema consistent with improving DVT. No free intraperitoneal fluid or free gas. Musculoskeletal: There are no acute or destructive bony lesions. 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 Reconstructed images demonstrate no additional findings. IMPRESSION: VASCULAR 1. No evidence of mesenteric ischemia. Arterial structures are widely patent. 2. Persistent but improving deep venous thrombosis involving the right common iliac, right external iliac, and bilateral internal iliac veins. The IVC thrombus seen previously has resolved in the interim. NON-VASCULAR 1. No bowel obstruction or ileus. No evidence of mesenteric ischemia. 2. 3.2 cm simple left ovarian cyst. No follow-up imaging is recommended. 3. Likely reactive borderline enlarged lymph nodes along the bilateral iliac and inguinal distributions. Electronically Signed   By: Sharlet Salina M.D.   On: 03/31/2021 23:05   Results for orders placed or performed during the hospital encounter of 03/31/21  SARS CORONAVIRUS 2 (TAT 6-24 HRS) Nasopharyngeal Nasopharyngeal Swab     Status: None   Collection Time: 04/01/21  4:02 AM   Specimen: Nasopharyngeal Swab  Result Value Ref Range Status   SARS Coronavirus 2 NEGATIVE NEGATIVE Final    Comment: (NOTE) SARS-CoV-2 target nucleic acids are NOT DETECTED.  The SARS-CoV-2 RNA is generally detectable in upper and lower respiratory specimens during the acute phase of infection. Negative results  do not preclude SARS-CoV-2 infection, do not rule out co-infections with other pathogens, and  should not be used as the sole basis for treatment or other patient management decisions. Negative results must be combined with clinical observations, patient history, and epidemiological information. The expected result is Negative.  Fact Sheet for Patients: HairSlick.no  Fact Sheet for Healthcare Providers: quierodirigir.com  This test is not yet approved or cleared by the [place] FDA and  has been authorized for detection and/or diagnosis of SARS-CoV-2 by FDA under an Emergency Use Authorization (EUA). This EUA will remain  in effect (meaning this test can be used) for the duration of the COVID-19 declaration under Se ction 564(b)(1) of the Act, 21 U.S.C. section 360bbb-3(b)(1), unless the authorization is terminated or revoked sooner.  Performed at [facility], [address] [place], [place] [address]   SARS CORONAVIRUS 2 (TAT 6-24 HRS) Nasopharyngeal Nasopharyngeal Swab     Status: None   Collection Time: 04/07/21  8:15 PM   Specimen: Nasopharyngeal Swab  Result Value Ref Range Status   SARS Coronavirus 2 NEGATIVE NEGATIVE Final    Comment: (NOTE) SARS-CoV-2 target nucleic acids are NOT DETECTED.  The SARS-CoV-2 RNA is generally detectable in upper and lower respiratory specimens during the acute phase of infection. Negative results do not preclude SARS-CoV-2 infection, do not rule out co-infections with other pathogens, and should not be used as the sole basis for treatment or other patient management decisions. Negative results must be combined with clinical observations, patient history, and epidemiological information. The expected result is Negative.  Fact Sheet for Patients: HairSlick.no  Fact Sheet for Healthcare Providers: quierodirigir.com  This test is not yet approved or cleared by the [place] FDA and  has been authorized for  detection and/or diagnosis of SARS-CoV-2 by FDA under an Emergency Use Authorization (EUA). This EUA will remain  in effect (meaning this test can be used) for the duration of the COVID-19 declaration under Se ction 564(b)(1) of the Act, 21 U.S.C. section 360bbb-3(b)(1), unless the authorization is terminated or revoked sooner.  Performed at [facility], [address], [place], [place] [address]     Signed:  Lynden Oxford MD.  [facility] 04/09/2021, 5:37 PM

## 2021-04-11 ENCOUNTER — Encounter: Payer: Self-pay | Admitting: Internal Medicine

## 2021-04-11 ENCOUNTER — Other Ambulatory Visit: Payer: Self-pay

## 2021-04-11 ENCOUNTER — Ambulatory Visit (INDEPENDENT_AMBULATORY_CARE_PROVIDER_SITE_OTHER): Payer: 59 | Admitting: Internal Medicine

## 2021-04-11 VITALS — BP 90/62 | HR 80 | Ht 65.0 in | Wt 154.0 lb

## 2021-04-11 DIAGNOSIS — I2699 Other pulmonary embolism without acute cor pulmonale: Secondary | ICD-10-CM

## 2021-04-11 DIAGNOSIS — I82499 Acute embolism and thrombosis of other specified deep vein of unspecified lower extremity: Secondary | ICD-10-CM | POA: Diagnosis not present

## 2021-04-11 NOTE — Progress Notes (Signed)
Cardiology Office Note:    Date:  04/11/2021   ID:  Andrea Hayes, DOB [birth year], MRN [id number]  PCP:  Fleet Contras, MD   [facility] Providers Cardiologist:  None     Referring MD: Rolly Salter, MD   No chief complaint on file. Hx of VTE  History of Present Illness:    Andrea Hayes is a 30 y.o. 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 Referral for coumadin clinic  [name] is s/p delivery in May. She developed near occlusive in the right PA in the RLL 01/23/2021. She had some challenges with lovenox and missed some doses. On 03/26/2021 was found to have DVT , she's S/p mechanical thrombectomy involving  as well right common iliac, right external iliac and bilateral internal iliac veins. Venous duplex of LE on recent admission 04/04/2021 of the  left femoral vein, left posterior tibial veins. The CT abd/pelvis showed persistent thrombosis of the right common iliac, right external leg and BL internal iliac veins. Vascular was consulted with no need for intervention. She di d not have signs of RV strain. Her echo was normal. Hematology was consulted for VTE hx. She was on xarelto and it was switched to coumadin to be managed by coumadin clinic. Referral sent to general cardiology as well. She denies history of stress test or heart cath. She denies family history of CVD. She denies light headedness, dizziness or syncope. She denies PND, orthopnea or lower extremity edema. She denies chest pain. She's had dyspnea on exertion that's now improving.    Today she notes lower calf pain. Still taking lovenox. No family hx of cancer. Her father has asthma. No blood clotting disorders in her family. No stroke history.  Past Medical History:  Diagnosis Date   Stroke [facility])     Past Surgical History:  Procedure Laterality Date   BIOPSY  04/05/2021   Procedure: BIOPSY;  Surgeon: Jenel Lucks, MD;  Location: [facility] ENDOSCOPY;   Service: Gastroenterology;;   COLONOSCOPY N/A 04/05/2021   Procedure: COLONOSCOPY;  Surgeon: Jenel Lucks, MD;  Location: [facility] ENDOSCOPY;  Service: Gastroenterology;  Laterality: N/A;   ESOPHAGOGASTRODUODENOSCOPY N/A 04/05/2021   Procedure: ESOPHAGOGASTRODUODENOSCOPY (EGD);  Surgeon: Jenel Lucks, MD;  Location: [facility] ENDOSCOPY;  Service: Gastroenterology;  Laterality: N/A;   NO PAST SURGERIES     PERIPHERAL VASCULAR BALLOON ANGIOPLASTY  03/24/2021   Procedure: PERIPHERAL VASCULAR BALLOON ANGIOPLASTY;  Surgeon: Leonie Douglas, MD;  Location: [facility] INVASIVE CV LAB;  Service: Cardiovascular;;  bilateral common iliacs   PERIPHERAL VASCULAR THROMBECTOMY N/A 03/23/2021   Procedure: PERIPHERAL VASCULAR THROMBECTOMY;  Surgeon: Maeola Harman, MD;  Location: [facility] INVASIVE CV LAB;  Service: Cardiovascular;  Laterality: N/A;   PERIPHERAL VASCULAR THROMBECTOMY N/A 03/24/2021   Procedure: LYSIS RECHECK;  Surgeon: Leonie Douglas, MD;  Location: [facility] INVASIVE CV LAB;  Service: Cardiovascular;  Laterality: N/A;    Current Medications: No outpatient medications have been marked as taking for the 04/11/21 encounter (Appointment) with Maisie Fus, MD.     Allergies:   Patient has no known allergies.   Social History   Socioeconomic History   Marital status: Single    Spouse name: Not on file   Number of children: Not on file   Years of education: Not on file   Highest education level: Not on file  Occupational History   Not on file  Tobacco Use   Smoking status: Never  Smokeless tobacco: Never  Vaping Use   Vaping Use: Never used  Substance and Sexual Activity   Alcohol use: No    Alcohol/week: 0.0 standard drinks   Drug use: No   Sexual activity: Yes    Birth control/protection: None  Other Topics Concern   Not on file  Social History Narrative   Right handed   Social Determinants of Health   Financial Resource Strain: Not on file  Food Insecurity: Not on file   Transportation Needs: Not on file  Physical Activity: Not on file  Stress: Not on file  Social Connections: Not on file     Family History: The patient's family history includes Asthma in her father. There is no history of Obesity, Hypertension, Diabetes, or Cancer.  ROS:   Please see the history of present illness.     All other systems reviewed and are negative.  EKGs/Labs/Other Studies Reviewed:    The following studies were reviewed today:   EKG:  EKG is  ordered today.  The ekg ordered today demonstrates   01/23/2021 Sinus tachycardia, no ischemic changes.  TTE 01/23/2021 EF 60-65% RV nl No valvular abnormalities  Recent Labs: 11/28/2020: B Natriuretic Peptide 41.3 03/25/2021: TSH 4.265 04/05/2021: ALT 79 04/08/2021: BUN 5; Creatinine, Ser 0.58; Hemoglobin 10.6; Platelets 181; Potassium 3.9; Sodium 135  Recent Lipid Panel    Component Value Date/Time   CHOL 111 07/25/2018 0140   TRIG 50 07/25/2018 0140   HDL 50 07/25/2018 0140   CHOLHDL 2.2 07/25/2018 0140   VLDL 10 07/25/2018 0140   LDLCALC 51 07/25/2018 0140     Risk Assessment/Calculations:           Physical Exam:    VS:  There were no vitals taken for this visit.    Wt Readings from Last 3 Encounters:  04/08/21 157 lb 1.6 oz (71.3 kg)  03/27/21 168 lb 6.9 oz (76.4 kg)  01/23/21 172 lb 9.9 oz (78.3 kg)     GEN:  Well nourished, well developed in no acute distress HEENT: Normal NECK: No JVD;  CARDIAC: RRR, no murmurs, rubs, gallops RESPIRATORY:  Clear to auscultation without rales, wheezing or rhonchi  ABDOMEN: Soft, non-tender, non-distended MUSCULOSKELETAL:  No edema; mild tenderness in her calfs BL SKIN: Warm and dry NEUROLOGIC:  Alert and oriented x 3 PSYCHIATRIC:  Normal affect   ASSESSMENT:    PE/DVT- she had no evidence of RV strain. Heart functions is normal. Her shortness of breath is improving. Will CC Dr. Mosetta Putt for her to follow up with hematology to manage INR. Unless anything  changes, no need to follow-up with cardiology. If she has any new cardiac conditions, we are happy to see her back.  PLAN:    In order of problems listed above:  No changes, she's continued on lovenox. Unable to set up coumadin clinic here without a cardiac condition.    Medication Adjustments/Labs and Tests Ordered: Current medicines are reviewed at length with the patient today.  Concerns regarding medicines are outlined above.   Signed, Maisie Fus, MD  04/11/2021 2:15 PM    [facility]

## 2021-04-11 NOTE — Patient Instructions (Signed)
Medication Instructions:  Your physician recommends that you continue on your current medications as directed. Please refer to the Current Medication list given to you today.  *If you need a refill on your cardiac medications before your next appointment, please call your pharmacy*   Follow-Up: At [facility], you and your health needs are our priority.  As part of our continuing mission to provide you with exceptional heart care, we have created designated Provider Care Teams.  These Care Teams include your primary Cardiologist (physician) and Advanced Practice Providers (APPs -  Physician Assistants and Nurse Practitioners) who all work together to provide you with the care you need, when you need it.  We recommend signing up for the patient portal called "MyChart".  Sign up information is provided on this After Visit Summary.  MyChart is used to connect with patients for Virtual Visits (Telemedicine).  Patients are able to view lab/test results, encounter notes, upcoming appointments, etc.  Non-urgent messages can be sent to your provider as well.   To learn more about what you can do with MyChart, go to [email or web address].    Your next appointment:   AS NEEDED with Dr. Mary Branch   

## 2021-04-12 ENCOUNTER — Telehealth: Payer: Self-pay | Admitting: Hematology

## 2021-04-12 ENCOUNTER — Other Ambulatory Visit: Payer: Self-pay | Admitting: Nurse Practitioner

## 2021-04-12 DIAGNOSIS — I82220 Acute embolism and thrombosis of inferior vena cava: Secondary | ICD-10-CM

## 2021-04-12 NOTE — Progress Notes (Signed)
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  She underwent hypercoagulopathy work-up which was negative except low protein S activity (22), lupus anticoagulant and antiphospholipid syndrome antibodies were negative.  She was discharged home with Lovenox injection, but she was noncompliant and stopped on her own.  She does not remember how long she was on it. She was admitted on January 23, 2021 for near occlusive right side PE with pulmonary infarction, IVC thrombosis, Doppler was negative for lower extremity DVT.  Patient was treated with Lovenox injection and transition to Coumadin. Not sure about compliance with coumadin.  Patient was readmitted on March 22, 2021 for epigastric and back pain for 2 weeks, CT scan showed extensive thrombosis involving infrarenal IVC, bilateral common iliac, right external iliac.  She was started on heparin drip, underwent tPA thrombolysis for 24 hours, then mechanical thrombectomy by vascular surgery on March 24, 2021.  She was transitioned to Xarelto. Per pt she has been compliant with Xarelto, no missing dose before current admission.  Readmitted on/30/22 for worsening left leg and epigastric pain.  CTA showed persistent but improved DVT involving the right common iliac, right external iliac and the bilateral internal iliac vein.  Doppler showed acute DVT in bilateral lower extremities.  Xarelto was stopped, treated with heparin drip in the hospital and  transition to Coumadin with goal INR 2.5-3.5.  Discharged 10/8 on Lovenox bridge  CURRENT THERAPY: Coumadin 7.5 mg daily plus Lovenox 110 mg subcu daily  INTERVAL HISTORY: Ms. Halleck presents for hospital f/up and anticoagulation monitoring.  She feels better since hospital discharge, stronger and active at home.  She continues Coumadin 7.5 mg daily at 4 PM and Lovenox 110 mg injection daily at 12 PM, she has not taken either yet this morning.  She started her period 4 days ago, bleeding is light.  Denies other bleeding.  Leg pain and swelling have improved.  Denies cough, chest pain, dyspnea.  Bowels moving regularly.  She is requesting information on diet restrictions on Coumadin.   MEDICAL HISTORY:  Past Medical History:  Diagnosis Date   Stroke [facility])     SURGICAL HISTORY: Past Surgical History:  Procedure Laterality Date   BIOPSY  04/05/2021   Procedure: BIOPSY;  Surgeon: Jenel Lucks, MD;  Location: [facility] ENDOSCOPY;  Service: Gastroenterology;;   COLONOSCOPY N/A 04/05/2021   Procedure: COLONOSCOPY;  Surgeon: Jenel Lucks, MD;  Location: [facility] ENDOSCOPY;  Service: Gastroenterology;  Laterality: N/A;   ESOPHAGOGASTRODUODENOSCOPY N/A 04/05/2021   Procedure: ESOPHAGOGASTRODUODENOSCOPY (EGD);  Surgeon: Jenel Lucks, MD;  Location: [facility] ENDOSCOPY;  Service: Gastroenterology;  Laterality: N/A;   NO PAST SURGERIES     PERIPHERAL VASCULAR BALLOON ANGIOPLASTY  03/24/2021   Procedure: PERIPHERAL VASCULAR BALLOON ANGIOPLASTY;  Surgeon: Leonie Douglas, MD;  Location: [facility] INVASIVE CV LAB;  Service: Cardiovascular;;  bilateral common iliacs   PERIPHERAL VASCULAR THROMBECTOMY N/A 03/23/2021   Procedure: PERIPHERAL VASCULAR THROMBECTOMY;  Surgeon: Maeola Harman, MD;  Location: [facility] INVASIVE CV LAB;  Service: Cardiovascular;  Laterality: N/A;   PERIPHERAL VASCULAR  THROMBECTOMY N/A 03/24/2021   Procedure: LYSIS RECHECK;  Surgeon: Leonie Douglas, MD;  Location: [facility] INVASIVE CV LAB;   Service: Cardiovascular;  Laterality: N/A;    I have reviewed the social history and family history with the patient and they are unchanged from previous note.  ALLERGIES:  has No Known Allergies.  MEDICATIONS:  Current Outpatient Medications  Medication Sig Dispense Refill   acetaZOLAMIDE (DIAMOX) 125 MG tablet Take 2 tablets (250 mg total) by mouth 2 (two) times daily. 120 tablet 5   benzonatate (TESSALON) 100 MG capsule Take 1 capsule (100 mg total) by mouth 3 (three) times daily. 20 capsule 0   docusate sodium (COLACE) 100 MG capsule Take 1 capsule (100 mg total) by mouth at bedtime. 10 capsule 0   HYDROcodone-acetaminophen (NORCO/VICODIN) 5-325 MG tablet Take 1 tablet by mouth 3 (three) times daily as needed for moderate pain or severe pain. 15 tablet 0   metoprolol succinate (TOPROL-XL) 25 MG 24 hr tablet Take 1 tablet (25 mg total) by mouth daily. 30 tablet 1   polyethylene glycol (MIRALAX / GLYCOLAX) 17 g packet Take 17 g by mouth daily. 14 each 0   Prenatal Vit-Fe Fumarate-FA (PRENATAL VITAMIN) 27-0.8 MG TABS Take 1 tablet by mouth daily. 30 tablet 12   Vitamin D, Ergocalciferol, (DRISDOL) 1.25 MG (50000 UNIT) CAPS capsule Take 50,000 Units by mouth every Wednesday.     enoxaparin (LOVENOX) 120 MG/0.8ML injection Inject 0.74 mLs (110 mg total) into the skin daily for 7 days. 5.6 mL 0   ferrous fumarate-b12-vitamic C-folic acid (TRINSICON / FOLTRIN) capsule Take 1 capsule by mouth 2 (two) times daily after a meal. 180 capsule 0   warfarin (COUMADIN) 7.5 MG tablet Take 1 tablet (7.5 mg total) by mouth one time only at 4 PM. 30 tablet 0   No current facility-administered medications for this visit.    PHYSICAL EXAMINATION:  Vitals:   04/13/21 0839  BP: 102/68  Pulse: 70  Resp: 16  Temp: 97.7 F (36.5 C)  SpO2: 99%   Filed Weights   04/13/21 0839  Weight: 153 lb 8 oz (69.6 kg)    GENERAL:alert, no distress and comfortable SKIN: No rash EYES: sclera clear LUNGS: clear  with normal breathing effort HEART: regular rate & rhythm, no lower extremity edema NEURO: alert & oriented x 3 with fluent speech, no focal motor/sensory deficits  LABORATORY DATA:  I have reviewed the data as listed CBC Latest Ref Rng & Units 04/13/2021 04/08/2021 04/07/2021  WBC 4.0 - 10.5 K/uL 4.9 6.1 7.7  Hemoglobin 12.0 - 15.0 g/dL 11.5(L) 10.6(L) 9.6(L)  Hematocrit 36.0 - 46.0 % 36.4 34.9(L) 30.3(L)  Platelets 150 - 400 K/uL 202 181 187     CMP Latest Ref Rng & Units 04/08/2021 04/05/2021 04/03/2021  Glucose 70 - 99 mg/dL 95 - 008(Q)  BUN 6 - 20 mg/dL 5(L) - 6  Creatinine 7.61 - 1.00 mg/dL 9.50 - 9.32  Sodium 671 - 145 mmol/L 135 - 133(L)  Potassium 3.5 - 5.1 mmol/L 3.9 - 3.8  Chloride 98 - 111 mmol/L 106 - 102  CO2 22 - 32 mmol/L 18(L) - 22  Calcium 8.9 - 10.3 mg/dL 9.4 - 8.8(L)  Total Protein 6.5 - 8.1 g/dL - 7.6 7.4  Total Bilirubin 0.3 - 1.2 mg/dL - 2.4(P) 0.5  Alkaline Phos 38 - 126 U/L - 305(H) 359(H)  AST 15 - 41 U/L - 37 175(H)  ALT 0 - 44 U/L - 79(H) 145(H)  RADIOGRAPHIC STUDIES: I have personally reviewed the radiological images as listed and agreed with the findings in the report. No results found.   ASSESSMENT & PLAN: 30 year old female  1.  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  -Most recently she developed acute lower extremity DVT on Xarelto, concerning for Xarelto failure.   Xarelto was stopped and she was treated with heparin in the hospital, and transition to Coumadin - Currently on Coumadin and Lovenox bridge, goal INR 2.5-3.5 -Ms. Simonin appears stable.  Currently on Coumadin 7.5 mg daily and Lovenox 110 mg subcu daily, tolerating well without bleeding except light menstrual bleeding -Labs reviewed, CBC shows mild anemia, normal platelets.  INR is 2.1, continue current dose coumadin 7.5 mg and lovenox 110 mg daily. Will repeat INR early next week  -She will need to be at goal INR for at least 24 hours or 2 consecutive days before stopping Lovenox -lab pending INR early next week, f/up in 4 weeks, or sooner if needed -she knows to call with increased bleeding, new cough/dyspnea, recurrent leg swelling or pain, or other concerns   Plan: [facility] course and today's labs reviewed -INR 2.1, continue Coumadin 7.5 mg daily and Lovenox 110 mg subcu daily  -Recheck INR 10/17 or 10/18, if still not at goal we will add a day of 10 mg -Will need to be at goal at least 24 hours or 2 consecutive days before stopping lovenox bridge -Return/call precautions reviewed  -Printed dietary guide given to pt -Refills: coumadin, lovenox, ferrous-B12-vitC -F/up 4 weeks, or sooner if needed  Orders Placed This Encounter  Procedures   CBC with Differential (Cancer Center Only)    Standing Status:   Standing    Number of Occurrences:   50    Standing Expiration Date:   04/13/2022   Protime-INR    Standing Status:   Standing    Number of Occurrences:   50    Standing Expiration Date:   04/13/2022    All questions were answered. The patient knows to call the clinic with any problems, questions or concerns. No barriers to learning were detected.  Total encounter time was 30 minutes.     Pollyann Samples, NP 04/13/21

## 2021-04-12 NOTE — Telephone Encounter (Signed)
Scheduled appointment per 10/11 sch msg. Patient is aware.

## 2021-04-13 ENCOUNTER — Inpatient Hospital Stay: Payer: 59 | Attending: Nurse Practitioner

## 2021-04-13 ENCOUNTER — Other Ambulatory Visit: Payer: Self-pay

## 2021-04-13 ENCOUNTER — Inpatient Hospital Stay (HOSPITAL_BASED_OUTPATIENT_CLINIC_OR_DEPARTMENT_OTHER): Payer: 59 | Admitting: Nurse Practitioner

## 2021-04-13 ENCOUNTER — Encounter: Payer: Self-pay | Admitting: Nurse Practitioner

## 2021-04-13 VITALS — BP 102/68 | HR 70 | Temp 97.7°F | Resp 16 | Wt 153.5 lb

## 2021-04-13 DIAGNOSIS — I82220 Acute embolism and thrombosis of inferior vena cava: Secondary | ICD-10-CM

## 2021-04-13 DIAGNOSIS — Z86718 Personal history of other venous thrombosis and embolism: Secondary | ICD-10-CM | POA: Insufficient documentation

## 2021-04-13 DIAGNOSIS — Z7901 Long term (current) use of anticoagulants: Secondary | ICD-10-CM | POA: Insufficient documentation

## 2021-04-13 LAB — CBC WITH DIFFERENTIAL (CANCER CENTER ONLY)
Abs Immature Granulocytes: 0.03 10*3/uL (ref 0.00–0.07)
Basophils Absolute: 0.1 10*3/uL (ref 0.0–0.1)
Basophils Relative: 1 %
Eosinophils Absolute: 0.1 10*3/uL (ref 0.0–0.5)
Eosinophils Relative: 3 %
HCT: 36.4 % (ref 36.0–46.0)
Hemoglobin: 11.5 g/dL — ABNORMAL LOW (ref 12.0–15.0)
Immature Granulocytes: 1 %
Lymphocytes Relative: 38 %
Lymphs Abs: 1.8 10*3/uL (ref 0.7–4.0)
MCH: 25.9 pg — ABNORMAL LOW (ref 26.0–34.0)
MCHC: 31.6 g/dL (ref 30.0–36.0)
MCV: 82 fL (ref 80.0–100.0)
Monocytes Absolute: 0.3 10*3/uL (ref 0.1–1.0)
Monocytes Relative: 7 %
Neutro Abs: 2.5 10*3/uL (ref 1.7–7.7)
Neutrophils Relative %: 50 %
Platelet Count: 202 10*3/uL (ref 150–400)
RBC: 4.44 MIL/uL (ref 3.87–5.11)
RDW: 14.6 % (ref 11.5–15.5)
WBC Count: 4.9 10*3/uL (ref 4.0–10.5)
nRBC: 0 % (ref 0.0–0.2)

## 2021-04-13 LAB — PROTIME-INR
INR: 2.1 — ABNORMAL HIGH (ref 0.8–1.2)
Prothrombin Time: 23.5 seconds — ABNORMAL HIGH (ref 11.4–15.2)

## 2021-04-13 MED ORDER — ENOXAPARIN SODIUM 120 MG/0.8ML IJ SOSY: 110.0000 mg | PREFILLED_SYRINGE | INTRAMUSCULAR | 0 refills | Status: DC

## 2021-04-13 MED ORDER — WARFARIN SODIUM 7.5 MG PO TABS: 7.5000 mg | ORAL_TABLET | Freq: Once | ORAL | 0 refills | Status: DC

## 2021-04-13 MED ORDER — FE FUMARATE-B12-VIT C-FA-IFC PO CAPS: 1.0000 | ORAL_CAPSULE | Freq: Two times a day (BID) | ORAL | 0 refills | Status: DC

## 2021-04-14 ENCOUNTER — Other Ambulatory Visit (HOSPITAL_COMMUNITY): Payer: Self-pay

## 2021-04-14 ENCOUNTER — Other Ambulatory Visit: Payer: Self-pay | Admitting: *Deleted

## 2021-04-14 ENCOUNTER — Telehealth: Payer: Self-pay | Admitting: Hematology

## 2021-04-14 NOTE — Patient Outreach (Signed)
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 On 03/31/21 she reported melena and lower left anterior leg pain, diagnosed and treated for left lower deep vein thrombosis (dvt)    Insurance [employer] medicaid prepaid health-wellcare   Andrea Andrea Hayes is able to verify her HIPAA identifiers Andrea Hayes discussed her preference to have Andrea Hayes spoken to her and English translated documents provided to her vs Andrea Hayes. RN CM adjusted her demographics   Assessment INR/PT/PTT education Pt's INR was 2.1 on 04/13/21  RN CM discussed the therapeutic range of 2.5-3.5  She reports she is administering her Lovenox injection independently daily at 1200   She is to return for an INR lab check next week 04/18/21  Coumadin is taken per pt at 12 and 4 pm   Baby sleeping Andrea Hayes reports Andrea Hayes weighed 4 lbs at birth and delivered at 37 weeks  She denies medical issues with the baby on today   Nutrition/Vitamin K intake Andrea Hayes states she was informed she needs to eat different foods She reports she does not eat a lot, fair appetite She has various questions about the food list offered to her during admission  Her list of food states low, medium, high RN CM educated her on the foods in these low, medium and high Vitamin K food groups She was encouraged to avoid the foods in the high food group, discussed how vitamin K promotes blood clotting and discussed also the importance of reading food labels   She was encouraged intake of the low vitamin K foods and limited medium vitamin K foods To assist with this she was ask to find foods in her home she generally take in most and read the labels with the RN CM  She was  encouraged to placed the high vitamin K foods out of reach or a different location  RN CM discussed with her Vitamin k and potassium differences Andrea Hayes voice appreciation for services rendered and better understanding of DVT home management She was encouraged to ask any questions needed  She did share that 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

## 2021-04-14 NOTE — Telephone Encounter (Signed)
Unable to leave voicemail with follow-up appointments per 10/13 los. Patient is Mychart Active.

## 2021-04-14 NOTE — Patient Outreach (Signed)
[facility]) Care Management  04/14/2021  Andrea Hayes 1991-04-02 [id number]   [facility] Care coordination-  [facility] multidisciplinary care discussion (MCD)   [facility] MCD team was sent a template report   [facility] RN CM reviewed Epic notes [facility] RN CM reviewed and provided an update on patient status and progression to the [facility] MCD team [facility] RN CM answered questions for MCD team  Recommendations - follow up with patient for hospital discharge, education, compliance of treatment plan Pending verification of possible need for managed medicaid case management service transfer, RN CM to be updated  Plan [facility] RN CM will follow up with patient today   Cala Bradford L. Noelle Penner, RN, BSN, CCM [facility] Telephonic Care Management Care Coordinator Office number [phone] Mobile number [phone]  Main [facility] number [phone] Fax number [phone]

## 2021-04-18 ENCOUNTER — Other Ambulatory Visit: Payer: Self-pay

## 2021-04-18 ENCOUNTER — Inpatient Hospital Stay: Payer: 59

## 2021-04-18 DIAGNOSIS — I82220 Acute embolism and thrombosis of inferior vena cava: Secondary | ICD-10-CM

## 2021-04-18 DIAGNOSIS — Z86718 Personal history of other venous thrombosis and embolism: Secondary | ICD-10-CM | POA: Diagnosis not present

## 2021-04-18 LAB — CBC WITH DIFFERENTIAL (CANCER CENTER ONLY)
Abs Immature Granulocytes: 0.01 10*3/uL (ref 0.00–0.07)
Basophils Absolute: 0.1 10*3/uL (ref 0.0–0.1)
Basophils Relative: 1 %
Eosinophils Absolute: 0.2 10*3/uL (ref 0.0–0.5)
Eosinophils Relative: 3 %
HCT: 37 % (ref 36.0–46.0)
Hemoglobin: 11.6 g/dL — ABNORMAL LOW (ref 12.0–15.0)
Immature Granulocytes: 0 %
Lymphocytes Relative: 35 %
Lymphs Abs: 1.8 10*3/uL (ref 0.7–4.0)
MCH: 25.7 pg — ABNORMAL LOW (ref 26.0–34.0)
MCHC: 31.4 g/dL (ref 30.0–36.0)
MCV: 82 fL (ref 80.0–100.0)
Monocytes Absolute: 0.4 10*3/uL (ref 0.1–1.0)
Monocytes Relative: 7 %
Neutro Abs: 2.7 10*3/uL (ref 1.7–7.7)
Neutrophils Relative %: 54 %
Platelet Count: 208 10*3/uL (ref 150–400)
RBC: 4.51 MIL/uL (ref 3.87–5.11)
RDW: 15 % (ref 11.5–15.5)
WBC Count: 5.1 10*3/uL (ref 4.0–10.5)
nRBC: 0 % (ref 0.0–0.2)

## 2021-04-18 LAB — PROTIME-INR
INR: 2.7 — ABNORMAL HIGH (ref 0.8–1.2)
Prothrombin Time: 28.4 seconds — ABNORMAL HIGH (ref 11.4–15.2)

## 2021-04-19 ENCOUNTER — Other Ambulatory Visit: Payer: Self-pay | Admitting: *Deleted

## 2021-04-19 NOTE — Patient Outreach (Addendum)
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 On 03/31/21 she reported melena and lower left anterior leg pain, diagnosed and treated for left lower deep vein thrombosis (dvt)     Insurance [employer] medicaid prepaid health-wellcare     Andrea Hayes is able to verify her HIPAA identifiers   Assessment Andrea Hayes reports she is feeling much better  She confirms following her treatment plan for DVT management She gives herself the Lovenox injections and is limiting vitamin K food intake   Pain management She does still have small amounts of pain, generally reports once a day or less She reports her only has Hydrocodone tablets left.  RN CM discussed with pt the importance of having the hospital medicines renewed during her hospital outpatient follow visits. She voiced understanding  She agreed and a conference call was completed with her to her primary care provider (PCP) office. Spoke with Ladene Artist nurse to discuss this. A message was taken to be discussed with the pcp. A call will be returned to the patient   INR management Her 04/18/21 INR was 2.7 Much improved from 2.1 and within the range of 2.5 -3.5  This was discussed with her  She was very pleased RN CM commended her for her home management interventions  Baby was initially asleep but then awakened  Mother reports no medical concerns with baby Andrea Hayes  Return to work Andrea Ostrosky inquired about returning to work She does not provide the name of the company but reports she will not be standing  She reports she will be sitting to work She was cautioned on sitting for extended periods of time for DVT prevention also She agrees to complete a  trial return to work and return home is she notices any abnormal symptoms    Plans Patient agrees to care plan and follow up within the next 7-14 business days     Caven Perine L. Noelle Penner, RN, BSN, CCM [facility] Telephonic Care Management Care Coordinator Office number [phone] Main [facility] number [phone] Fax number [phone]

## 2021-04-20 ENCOUNTER — Other Ambulatory Visit: Payer: Self-pay

## 2021-04-20 ENCOUNTER — Inpatient Hospital Stay: Payer: 59

## 2021-04-20 DIAGNOSIS — I82220 Acute embolism and thrombosis of inferior vena cava: Secondary | ICD-10-CM

## 2021-04-20 DIAGNOSIS — Z86718 Personal history of other venous thrombosis and embolism: Secondary | ICD-10-CM | POA: Diagnosis not present

## 2021-04-20 LAB — CBC WITH DIFFERENTIAL (CANCER CENTER ONLY)
Abs Immature Granulocytes: 0.02 10*3/uL (ref 0.00–0.07)
Basophils Absolute: 0.1 10*3/uL (ref 0.0–0.1)
Basophils Relative: 1 %
Eosinophils Absolute: 0.2 10*3/uL (ref 0.0–0.5)
Eosinophils Relative: 5 %
HCT: 38.4 % (ref 36.0–46.0)
Hemoglobin: 12 g/dL (ref 12.0–15.0)
Immature Granulocytes: 1 %
Lymphocytes Relative: 38 %
Lymphs Abs: 1.7 10*3/uL (ref 0.7–4.0)
MCH: 25.8 pg — ABNORMAL LOW (ref 26.0–34.0)
MCHC: 31.3 g/dL (ref 30.0–36.0)
MCV: 82.4 fL (ref 80.0–100.0)
Monocytes Absolute: 0.5 10*3/uL (ref 0.1–1.0)
Monocytes Relative: 10 %
Neutro Abs: 1.9 10*3/uL (ref 1.7–7.7)
Neutrophils Relative %: 45 %
Platelet Count: 210 10*3/uL (ref 150–400)
RBC: 4.66 MIL/uL (ref 3.87–5.11)
RDW: 15.1 % (ref 11.5–15.5)
WBC Count: 4.4 10*3/uL (ref 4.0–10.5)
nRBC: 0 % (ref 0.0–0.2)

## 2021-04-20 LAB — PROTIME-INR
INR: 2.5 — ABNORMAL HIGH (ref 0.8–1.2)
Prothrombin Time: 27.3 seconds — ABNORMAL HIGH (ref 11.4–15.2)

## 2021-04-21 ENCOUNTER — Telehealth: Payer: Self-pay | Admitting: *Deleted

## 2021-04-21 NOTE — Telephone Encounter (Signed)
-----   Message from Pollyann Samples, NP sent at 04/20/2021  3:04 PM EDT ----- Please let pt know INR is at goal now x2, she can stop lovenox injection and continue coumadin same dose. Ok if she already did inj today. Repeat lab Monday 10/24, I sent schedule message.  Thanks, Clayborn Heron NP

## 2021-04-21 NOTE — Telephone Encounter (Signed)
Per Lacie Burton, NP, called pt with message below. Pt verbalized understanding.  ?

## 2021-04-24 ENCOUNTER — Telehealth: Payer: Self-pay | Admitting: Nurse Practitioner

## 2021-04-24 NOTE — Telephone Encounter (Signed)
Sch per 10/21 inbasket, no answer

## 2021-04-25 ENCOUNTER — Inpatient Hospital Stay: Payer: 59

## 2021-04-25 ENCOUNTER — Other Ambulatory Visit: Payer: Self-pay

## 2021-04-25 ENCOUNTER — Telehealth: Payer: Self-pay

## 2021-04-25 ENCOUNTER — Other Ambulatory Visit: Payer: Self-pay | Admitting: Nurse Practitioner

## 2021-04-25 DIAGNOSIS — I82220 Acute embolism and thrombosis of inferior vena cava: Secondary | ICD-10-CM

## 2021-04-25 DIAGNOSIS — Z86718 Personal history of other venous thrombosis and embolism: Secondary | ICD-10-CM | POA: Diagnosis not present

## 2021-04-25 LAB — PROTIME-INR
INR: 2.1 — ABNORMAL HIGH (ref 0.8–1.2)
Prothrombin Time: 23.3 seconds — ABNORMAL HIGH (ref 11.4–15.2)

## 2021-04-25 MED ORDER — WARFARIN SODIUM 10 MG PO TABS: ORAL_TABLET | ORAL | 1 refills | Status: DC

## 2021-04-25 NOTE — Telephone Encounter (Signed)
This nurse reached out to patient and made her aware of INR results.  Informed her per Theodis Blaze NP that her levels are sub therapeutic and she will increase her dose to 10 mg on Wednesday and Friday, every other day she is to continue the 7.5 mg.  Advised that a script has been called in to the pharmacy.  Patient acknowledged understanding.  Next lab day is scheduled for 10/31.  No further questions or concerns at this time.

## 2021-04-26 ENCOUNTER — Other Ambulatory Visit: Payer: Self-pay | Admitting: *Deleted

## 2021-04-26 ENCOUNTER — Inpatient Hospital Stay: Payer: 59 | Admitting: Hematology

## 2021-04-26 ENCOUNTER — Inpatient Hospital Stay: Payer: 59

## 2021-04-26 NOTE — Patient Outreach (Signed)
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 On 03/31/21 she reported melena and lower left anterior leg pain, diagnosed and treated for left lower deep vein thrombosis (dvt)     Insurance [employer] medicaid prepaid health-wellcare     [name] is able to verify her HIPAA identifiers  Assessment [name] reports she is doing well today  She confirms completion of appointments and labs on 04/25/21  She reports her daughter is doing well    Patient Active Problem List   Diagnosis Date Noted   Fecal occult blood test positive    Anemia    Lower extremity pain, anterior, left 04/01/2021   Abdominal pain 03/31/2021   DVT (deep venous thrombosis) (HCC) 03/22/2021   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   Acute respiratory failure with hypoxia (HCC) 01/26/2021   Chronic right arterial ischemic stroke, MCA (middle cerebral artery) 01/23/2021   Pulmonary embolism (HCC) 01/23/2021   Leukocytosis 01/23/2021   Thrombocytopenia (HCC) 01/23/2021   Elevated liver enzymes 01/23/2021   Chest pain 01/23/2021   Back pain 01/23/2021   Vaginal delivery 12/05/2020   Headache 11/28/2020   HTN (hypertension) 11/28/2020   Severe preeclampsia, delivered 11/04/2020   IUGR (intrauterine growth restriction) affecting care of mother 10/31/2020   Thrombocytopenia affecting pregnancy (HCC) 05/27/2020   Acute right arterial ischemic stroke, middle cerebral artery (MCA) (HCC)    Dural venous sinus thrombosis    Idiopathic intracranial hypertension    Cerebral venous thrombosis 07/24/2018   Plans Patient agrees to care plan and follow up within the next business 7-14 days  Goals Addressed               This  Visit's Progress     Patient Stated     Keep or Improve My Strength-Stroke [facility]) (pt-stated)   On track     Timeframe:  Short-Term Goal Priority:  High Start Date:                          04/03/21   Expected End Date:       05/31/21                Follow Up Date 05/04/21  Barriers: Knowledge    - eat healthy to increase strength   Notes:  04/26/21 [name] report a decrease in INR from 2.5 to 2.1 and noted fatigue with increase activity in the home. No falls      Knowledge deficit related management of deep vein thrombosis, stroke management and care coordination needs (pt-stated)   Not on track     Timeframe:  Long Term Goal Priority:  High Start Date:                          04/26/21   Expected End Date:       05/31/21                Follow Up Date 05/04/21  Barriers: Knowledge  Patient will attend all scheduled provider appointments Patient will call provider office for new concerns or questions about her diet, medicines, fatigue or worsening thrombosis symptoms  Notes 04/26/21 Pt reports she stopped the use  of Lovenox prior to the 04/25/21 lab visit INR from 04/25/21 was 2.1 previously was 2.5. She is to remain therapeutically 2.5-3.5. She reports she is to start taking Lovenox now Wednesday and Fridays. She has not returned to work yet. She has notice fatigue at home as she has increased her activity . She and RN CM discussed some options to possibly help her sleep better, have less fatigue. Her upcoming appointments were reviewed        Klyde Banka L. Noelle Penner, RN, BSN, CCM [facility] Telephonic Care Management Care Coordinator Office number [phone] Main [facility] number [phone] Fax number [phone]

## 2021-04-27 ENCOUNTER — Other Ambulatory Visit: Payer: Self-pay | Admitting: *Deleted

## 2021-04-27 DIAGNOSIS — I82220 Acute embolism and thrombosis of inferior vena cava: Secondary | ICD-10-CM

## 2021-04-28 NOTE — Progress Notes (Signed)
Andrea Hayes The biopsies taken from your stomach were notable for mild reactive gastropathy which is a common finding and often related to use of certain medications (usually NSAIDs), but there was no evidence of Helicobacter pylori infection. This common finding is not felt to necessarily be a cause of any particular symptom and there is no specific treatment or further evaluation recommended.  I would like to recheck your liver enzymes.  They had been elevated while you were in the hospital.  If they are still elevated, we may need to some more testing.  Bonita Quin, can you have Andrea Hayes come in for a hepatic funtion panel soon?

## 2021-05-01 ENCOUNTER — Telehealth: Payer: Self-pay | Admitting: Gastroenterology

## 2021-05-01 ENCOUNTER — Other Ambulatory Visit: Payer: Self-pay

## 2021-05-01 ENCOUNTER — Inpatient Hospital Stay: Payer: 59

## 2021-05-01 DIAGNOSIS — I82220 Acute embolism and thrombosis of inferior vena cava: Secondary | ICD-10-CM

## 2021-05-01 DIAGNOSIS — R7989 Other specified abnormal findings of blood chemistry: Secondary | ICD-10-CM

## 2021-05-01 DIAGNOSIS — Z86718 Personal history of other venous thrombosis and embolism: Secondary | ICD-10-CM | POA: Diagnosis not present

## 2021-05-01 LAB — PROTIME-INR
INR: 2.8 — ABNORMAL HIGH (ref 0.8–1.2)
Prothrombin Time: 29.7 s — ABNORMAL HIGH (ref 11.4–15.2)

## 2021-05-01 NOTE — Telephone Encounter (Signed)
Patient called requesting to speak with you regarding lab results said she is not understanding what was sent via MyChart.

## 2021-05-01 NOTE — Progress Notes (Signed)
[facility] [place]  ASSESSMENT / PLAN: 30 y.o. 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  With Coumadin therapy indefinitely.  Continue compression / elevation to manage swelling. Follow up with me as needed.  CHIEF COMPLAINT: follow up after thrombectomy for IVC / iliac DVT  HISTORY OF PRESENT ILLNESS: Andrea Hayes is a 30 y.o. female who returns to clinic for follow-up evaluation.  She underwentendovascular thrombectomy for postpartum iliocaval deep venous thrombosis 03/24/21. She developed a new PE postprocedure 03/26/21 which did not cause cardiopulmonary embarrassment. She returned to the hospital 04/01/21 for evaluation of new left leg and abdominal pain. Imaging demonstrated resolution of the IVC thrombus and improvement in the R iliac thrombus. Duplex showed bilateral lower extremity deep venous thrombosis. The patient was transitioned to Coumadin with a Lovenox bridge. She should continue this indefinitely. Duplex from today reviewed with patient.  Today in clinic, the patient feels well.  She only complains of mild left hip discomfort.  Past Medical History:  Diagnosis Date   Stroke [facility])   DVT as above  Past Surgical History:  Procedure Laterality Date   BIOPSY  04/05/2021   Procedure: BIOPSY;  Surgeon: Daryel November, MD;  Location: [facility] ENDOSCOPY;  Service: Gastroenterology;;   COLONOSCOPY N/A 04/05/2021   Procedure: COLONOSCOPY;  Surgeon: Daryel November, MD;  Location: [place];  Service: Gastroenterology;  Laterality: N/A;   ESOPHAGOGASTRODUODENOSCOPY N/A 04/05/2021   Procedure: ESOPHAGOGASTRODUODENOSCOPY (EGD);  Surgeon: Daryel November, MD;  Location: [place];  Service: Gastroenterology;  Laterality: N/A;   NO PAST SURGERIES     PERIPHERAL VASCULAR BALLOON ANGIOPLASTY  03/24/2021   Procedure: PERIPHERAL VASCULAR BALLOON ANGIOPLASTY;  Surgeon: Cherre Robins, MD;  Location: [place] CV  LAB;  Service: Cardiovascular;;  bilateral common iliacs   PERIPHERAL VASCULAR THROMBECTOMY N/A 03/23/2021   Procedure: PERIPHERAL VASCULAR THROMBECTOMY;  Surgeon: Waynetta Sandy, MD;  Location: [place] CV LAB;  Service: Cardiovascular;  Laterality: N/A;   PERIPHERAL VASCULAR THROMBECTOMY N/A 03/24/2021   Procedure: LYSIS RECHECK;  Surgeon: Cherre Robins, MD;  Location: [place] CV LAB;  Service: Cardiovascular;  Laterality: N/A;    Family History  Problem Relation Age of Onset   Asthma Father    Obesity Neg Hx    Hypertension Neg Hx    Diabetes Neg Hx    Cancer Neg Hx     Social History   Socioeconomic History   Marital status: Single    Spouse name: Not on file   Number of children: Not on file   Years of education: Not on file   Highest education level: Not on file  Occupational History   Not on file  Tobacco Use   Smoking status: Never   Smokeless tobacco: Never  Vaping Use   Vaping Use: Never used  Substance and Sexual Activity   Alcohol use: No    Alcohol/week: 0.0 standard drinks   Drug use: No   Sexual activity: Yes    Birth control/protection: None  Other Topics Concern   Not on file  Social History Narrative   Right handed   Social Determinants of Health   Financial Resource Strain: Not on file  Food Insecurity: Not on file  Transportation Needs: Not on file  Physical Activity: Not on file  Stress: Not on file  Social Connections: Not on file  Intimate Partner Violence: Not on file    No Known Allergies  Current Outpatient Medications  Medication  Sig Dispense Refill   acetaZOLAMIDE (DIAMOX) 125 MG tablet Take 2 tablets (250 mg total) by mouth 2 (two) times daily. 120 tablet 5   benzonatate (TESSALON) 100 MG capsule Take 1 capsule (100 mg total) by mouth 3 (three) times daily. 20 capsule 0   docusate sodium (COLACE) 100 MG capsule Take 1 capsule (100 mg total) by mouth at bedtime. 10 capsule 0   ferrous [id number]  C-folic acid (TRINSICON / FOLTRIN) capsule Take 1 capsule by mouth 2 (two) times daily after a meal. 180 capsule 0   polyethylene glycol (MIRALAX / GLYCOLAX) 17 g packet Take 17 g by mouth daily. 14 each 0   Prenatal Vit-Fe Fumarate-FA (PRENATAL VITAMIN) 27-0.8 MG TABS Take 1 tablet by mouth daily. 30 tablet 12   Vitamin D, Ergocalciferol, (DRISDOL) 1.25 MG (50000 UNIT) CAPS capsule Take 50,000 Units by mouth every Wednesday.     warfarin (COUMADIN) 10 MG tablet Take 1 tablet by mouth once daily on Wednesdays and Fridays 30 tablet 1   warfarin (COUMADIN) 7.5 MG tablet Take 1 tablet (7.5 mg total) by mouth one time only at 4 PM. 30 tablet 0   metoprolol succinate (TOPROL-XL) 25 MG 24 hr tablet Take 1 tablet (25 mg total) by mouth daily. (Patient not taking: Reported on 05/02/2021) 30 tablet 1   No current facility-administered medications for this visit.    REVIEW OF SYSTEMS:  [X]  denotes positive finding, [ ]  denotes negative finding Cardiac  Comments:  Chest pain or chest pressure:    Shortness of breath upon exertion:    Short of breath when lying flat:    Irregular heart rhythm:        Vascular    Pain in calf, thigh, or hip brought on by ambulation:    Pain in feet at night that wakes you up from your sleep:     Blood clot in your veins:    Leg swelling:         Pulmonary    Oxygen at home:    Productive cough:     Wheezing:         Neurologic    Sudden weakness in arms or legs:     Sudden numbness in arms or legs:     Sudden onset of difficulty speaking or slurred speech:    Temporary loss of vision in one eye:     Problems with dizziness:         Gastrointestinal    Blood in stool:     Vomited blood:         Genitourinary    Burning when urinating:     Blood in urine:        Psychiatric    Major depression:         Hematologic    Bleeding problems:    Problems with blood clotting too easily:        Skin    Rashes or ulcers:        Constitutional    Fever  or chills:      PHYSICAL EXAM Vitals:   05/02/21 0942  BP: 132/74  Pulse: 60  Resp: 20  Temp: 97.7 F (36.5 C)  SpO2: 97%  Weight: 153 lb (69.4 kg)  Height: 5\' 5"  (1.651 m)    Constitutional: well appearing. no distress. Appears well nourished.  Neurologic: CN intact. no focal findings. no sensory loss. Psychiatric:  Mood and affect symmetric and appropriate. Eyes:  No icterus. No conjunctival  pallor. Ears, nose, throat:  mucous membranes moist. Midline trachea.  Cardiac: regular rate and rhythm.  Respiratory:  unlabored. Abdominal:  soft, non-tender, non-distended.  Peripheral vascular: 2+ AT Extremity: trace lower extremity edema. no cyanosis. no pallor.  Skin: no gangrene. no ulceration.  Lymphatic: no Stemmer's sign. no palpable lymphadenopathy.  PERTINENT LABORATORY AND RADIOLOGIC DATA  Most recent CBC CBC Latest Ref Rng & Units 04/20/2021 04/18/2021 04/13/2021  WBC 4.0 - 10.5 K/uL 4.4 5.1 4.9  Hemoglobin 12.0 - 15.0 g/dL 52.8 11.6(L) 11.5(L)  Hematocrit 36.0 - 46.0 % 38.4 37.0 36.4  Platelets 150 - 400 K/uL 210 208 202     Most recent CMP CMP Latest Ref Rng & Units 04/08/2021 04/05/2021 04/03/2021  Glucose 70 - 99 mg/dL 95 - 413(K)  BUN 6 - 20 mg/dL 5(L) - 6  Creatinine 4.40 - 1.00 mg/dL 1.02 - 7.25  Sodium 366 - 145 mmol/L 135 - 133(L)  Potassium 3.5 - 5.1 mmol/L 3.9 - 3.8  Chloride 98 - 111 mmol/L 106 - 102  CO2 22 - 32 mmol/L 18(L) - 22  Calcium 8.9 - 10.3 mg/dL 9.4 - 8.8(L)  Total Protein 6.5 - 8.1 g/dL - 7.6 7.4  Total Bilirubin 0.3 - 1.2 mg/dL - 4.4(I) 0.5  Alkaline Phos 38 - 126 U/L - 305(H) 359(H)  AST 15 - 41 U/L - 37 175(H)  ALT 0 - 44 U/L - 79(H) 145(H)    Renal function CrCl cannot be calculated (Patient's most recent lab result is older than the maximum 21 days allowed.).  Hgb A1c MFr Bld (%)  Date Value  11/28/2020 5.5    LDL Cholesterol  Date Value Ref Range Status  07/25/2018 51 0 - 99 mg/dL Final    Comment:           Total  Cholesterol/HDL:CHD Risk Coronary Heart Disease Risk Table                     Men   Women  1/2 Average Risk   3.4   3.3  Average Risk       5.0   4.4  2 X Average Risk   9.6   7.1  3 X Average Risk  23.4   11.0        Use the calculated Patient Ratio above and the CHD Risk Table to determine the patient's CHD Risk.        ATP III CLASSIFICATION (LDL):  <100     mg/dL   Optimal  347-425  mg/dL   Near or Above                    Optimal  130-159  mg/dL   Borderline  956-387  mg/dL   High  >564     mg/dL   Very High Performed at [facility], [address], [place], [place] [address]      Rande Brunt. Lenell Antu, MD [facility] Phone Number: [phone] 05/02/2021 12:39 PM  Total time spent on preparing this encounter including chart review, data review, collecting history, examining the patient, coordinating care for this established patient, 30 minutes.  Portions of this report may have been transcribed using voice recognition software.  Every effort has been made to ensure accuracy; however, inadvertent computerized transcription errors may still be present.

## 2021-05-02 ENCOUNTER — Telehealth: Payer: Self-pay | Admitting: Nurse Practitioner

## 2021-05-02 ENCOUNTER — Encounter: Payer: Self-pay | Admitting: Vascular Surgery

## 2021-05-02 ENCOUNTER — Ambulatory Visit (HOSPITAL_COMMUNITY)
Admission: RE | Admit: 2021-05-02 | Discharge: 2021-05-02 | Disposition: A | Discharge status: Home / Self Care | Payer: 59 | Source: Ambulatory Visit | Attending: Vascular Surgery | Admitting: Vascular Surgery

## 2021-05-02 ENCOUNTER — Ambulatory Visit (INDEPENDENT_AMBULATORY_CARE_PROVIDER_SITE_OTHER): Payer: 59 | Admitting: Vascular Surgery

## 2021-05-02 VITALS — BP 132/74 | HR 60 | Temp 97.7°F | Resp 20 | Ht 65.0 in | Wt 153.0 lb

## 2021-05-02 DIAGNOSIS — I824Y3 Acute embolism and thrombosis of unspecified deep veins of proximal lower extremity, bilateral: Secondary | ICD-10-CM

## 2021-05-02 DIAGNOSIS — I82220 Acute embolism and thrombosis of inferior vena cava: Secondary | ICD-10-CM | POA: Diagnosis not present

## 2021-05-02 NOTE — Telephone Encounter (Signed)
See mychart message that explained results and where to come for labs.

## 2021-05-02 NOTE — Telephone Encounter (Signed)
Sch per 11/1 inbasket, no message left because unavailable to leave msg

## 2021-05-03 ENCOUNTER — Inpatient Hospital Stay: Payer: 59

## 2021-05-04 ENCOUNTER — Other Ambulatory Visit: Payer: Self-pay | Admitting: *Deleted

## 2021-05-04 NOTE — Patient Outreach (Addendum)
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 On 03/31/21 she reported melena and lower left anterior leg pain, diagnosed and treated for left lower deep vein thrombosis (dvt)     Insurance [employer] medicaid prepaid health-wellcare      Outreach attempt to the listed at the preferred outreach number in EPIC  No answer. No able to leave a compliant voicemail message    Plan: [facility] RN CM scheduled this patient for another call attempt within 4-7 business days Unsuccessful outreach on 05/04/21   Trigg Delarocha L. Noelle Penner, RN, BSN, CCM [facility] Telephonic Care Management Care Coordinator Office number [phone] Mobile number [phone]  Main [facility] number [phone] Fax number [phone]

## 2021-05-05 ENCOUNTER — Other Ambulatory Visit: Payer: Self-pay

## 2021-05-05 ENCOUNTER — Inpatient Hospital Stay: Payer: 59 | Attending: Hematology

## 2021-05-05 ENCOUNTER — Other Ambulatory Visit: Payer: 59

## 2021-05-05 ENCOUNTER — Other Ambulatory Visit: Payer: Self-pay | Admitting: *Deleted

## 2021-05-05 DIAGNOSIS — L292 Pruritus vulvae: Secondary | ICD-10-CM | POA: Diagnosis not present

## 2021-05-05 DIAGNOSIS — I82220 Acute embolism and thrombosis of inferior vena cava: Secondary | ICD-10-CM | POA: Insufficient documentation

## 2021-05-05 LAB — PROTIME-INR
INR: 3.4 — ABNORMAL HIGH (ref 0.8–1.2)
Prothrombin Time: 34.1 seconds — ABNORMAL HIGH (ref 11.4–15.2)

## 2021-05-05 NOTE — Patient Outreach (Signed)
[facility]) Care Management  05/05/2021  Ferol Sekula Jan 13, 1991 [id number]   [facility] Care coordination  Message received that was left by pt apologizing for missing RN CM outreach  Stating her availability to be reached preferably prior to noon daily  Plan [facility] RN CM will follow up with patient within the next business 4-7 days    Gabrelle Roca L. Noelle Penner, RN, BSN, CCM [facility] Telephonic Care Management Care Coordinator Office number [phone] Mobile number [phone]  Main [facility] number [phone] Fax number [phone]

## 2021-05-06 ENCOUNTER — Other Ambulatory Visit: Payer: Self-pay | Admitting: Nurse Practitioner

## 2021-05-08 ENCOUNTER — Other Ambulatory Visit: Payer: Self-pay | Admitting: *Deleted

## 2021-05-08 NOTE — Patient Outreach (Signed)
[facility]) Care Management  05/08/2021  Andrea Hayes 04-30-1991 [id number]   [facility] outreach to post hospital patient   [name] returned a call to RN CM  She is doing very well  She reports taking her medicines as ordered, attending all lab visits and following her recommended low K diet  She has returned to work 1 p to 9 p She has no worsening symptoms that has not been reported to her MD She was reminded to not stay stationary and to keep active for good blood circulation  She was commended on all her hard home management interventions  Her newborn is doing very well and she has support of her mother 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 Noelle Penner, RN, BSN, CCM [facility] Telephonic Care Management Care Coordinator Office number [phone] [facility] number [phone] Fax number [phone]

## 2021-05-09 ENCOUNTER — Ambulatory Visit: Payer: Self-pay | Admitting: *Deleted

## 2021-05-11 ENCOUNTER — Inpatient Hospital Stay: Payer: 59

## 2021-05-11 ENCOUNTER — Other Ambulatory Visit: Payer: Self-pay

## 2021-05-11 ENCOUNTER — Encounter: Payer: Self-pay | Admitting: Nurse Practitioner

## 2021-05-11 ENCOUNTER — Inpatient Hospital Stay (HOSPITAL_BASED_OUTPATIENT_CLINIC_OR_DEPARTMENT_OTHER): Payer: 59 | Admitting: Nurse Practitioner

## 2021-05-11 VITALS — BP 98/62 | HR 68 | Temp 97.3°F | Resp 18 | Ht 65.0 in | Wt 154.1 lb

## 2021-05-11 DIAGNOSIS — I82220 Acute embolism and thrombosis of inferior vena cava: Secondary | ICD-10-CM

## 2021-05-11 DIAGNOSIS — R2231 Localized swelling, mass and lump, right upper limb: Secondary | ICD-10-CM | POA: Diagnosis not present

## 2021-05-11 LAB — CBC WITH DIFFERENTIAL (CANCER CENTER ONLY)
Abs Immature Granulocytes: 0.01 10*3/uL (ref 0.00–0.07)
Basophils Absolute: 0 10*3/uL (ref 0.0–0.1)
Basophils Relative: 1 %
Eosinophils Absolute: 0.1 10*3/uL (ref 0.0–0.5)
Eosinophils Relative: 1 %
HCT: 39.6 % (ref 36.0–46.0)
Hemoglobin: 12.4 g/dL (ref 12.0–15.0)
Immature Granulocytes: 0 %
Lymphocytes Relative: 32 %
Lymphs Abs: 1.6 10*3/uL (ref 0.7–4.0)
MCH: 25.3 pg — ABNORMAL LOW (ref 26.0–34.0)
MCHC: 31.3 g/dL (ref 30.0–36.0)
MCV: 80.8 fL (ref 80.0–100.0)
Monocytes Absolute: 0.4 10*3/uL (ref 0.1–1.0)
Monocytes Relative: 7 %
Neutro Abs: 2.9 10*3/uL (ref 1.7–7.7)
Neutrophils Relative %: 59 %
Platelet Count: 187 10*3/uL (ref 150–400)
RBC: 4.9 MIL/uL (ref 3.87–5.11)
RDW: 15.4 % (ref 11.5–15.5)
WBC Count: 4.9 10*3/uL (ref 4.0–10.5)
nRBC: 0 % (ref 0.0–0.2)

## 2021-05-11 LAB — PROTIME-INR
INR: 3.6 — ABNORMAL HIGH (ref 0.8–1.2)
Prothrombin Time: 36 seconds — ABNORMAL HIGH (ref 11.4–15.2)

## 2021-05-11 MED ORDER — CLOTRIMAZOLE 3 2 % VA CREA: 1.0000 | TOPICAL_CREAM | Freq: Every day | VAGINAL | 0 refills | Status: DC

## 2021-05-11 NOTE — Progress Notes (Signed)
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  She underwent hypercoagulopathy work-up which was negative except low protein S activity (22), lupus anticoagulant and antiphospholipid syndrome antibodies were negative.  She was discharged home with Lovenox injection, but she was noncompliant and stopped on her own.  She does not remember how long she was on it. She was admitted on January 23, 2021 for near occlusive right side PE with pulmonary infarction, IVC thrombosis, Doppler was negative for lower extremity DVT.  Patient was treated with Lovenox injection and transition to Coumadin. Not sure about compliance with coumadin.  Patient was readmitted on March 22, 2021 for epigastric and back pain for 2 weeks, CT scan showed extensive thrombosis involving infrarenal IVC, bilateral common iliac, right external iliac.  She was started on heparin drip, underwent tPA thrombolysis for 24 hours, then mechanical thrombectomy by vascular surgery on March 24, 2021.  She was transitioned to Xarelto. Per pt she has been compliant with Xarelto, no missing dose before current admission.  Readmitted on/30/22 for worsening left leg and epigastric pain.  CTA showed persistent but improved DVT involving the right common iliac, right external iliac and the bilateral internal iliac vein.  Doppler showed acute DVT in bilateral lower extremities.  Xarelto was stopped, treated with heparin drip in the  hospital and transition to Coumadin with goal INR 2.5-3.5.  Discharged 10/8 on Lovenox bridge that was d/c'd after reaching therapeutic INR on coumadin  CURRENT THERAPY: Coumadin 7.5 mg daily except 10 mg on Wednesdays and fridays for goal INR 2.5 - 3.5. INR 3.6 on 05/11/21, reduce to 7.5 mg daily except 10 mg on wednesdays   INTERVAL HISTORY: Ms. Wangelin returns for INR check and follow-up as scheduled.  Last seen by me for first outpatient follow-up 04/13/2021, Coumadin has been adjusted in the interim.  She followed up with vascular surgery 05/02/2021 who felt she was doing well.  She continues Coumadin 7.5 mg daily except 10 mg on Mondays and Fridays.  She has not missed any doses, compliant with diet.  She has not started new medication.  She denies bruising or bleeding.  She still has some Discomfort in the right calf.  She is active.  Denies cough, chest pain, dyspnea.  Recently she developed painful lump and open sore in the right axilla. She was nursing her infant until recent hospitalization in 04/2021 then had to stop. Denies lump or concern in her breast. She also developed vaginal itching.  She denies discharge, UTI symptoms, fever, chills.  She is sexually active with a monogamous partner, no known infections.   MEDICAL HISTORY:  Past Medical History:  Diagnosis Date   Stroke [facility])     SURGICAL HISTORY: Past Surgical History:  Procedure Laterality Date   BIOPSY  04/05/2021   Procedure: BIOPSY;  Surgeon: Daryel November, MD;  Location: [facility] ENDOSCOPY;  Service: Gastroenterology;;   COLONOSCOPY N/A 04/05/2021   Procedure: COLONOSCOPY;  Surgeon: Daryel November, MD;  Location[facility]COPY;  Service: Gastroenterology;  Laterality: N/A;   ESOPHAGOGASTRODUODENOSCOPY  N/A 04/05/2021   Procedure: ESOPHAGOGASTRODUODENOSCOPY (EGD);  Surgeon: Jenel Lucks, MD;  [facility] ENDOSCOPY;  Service: Gastroenterology;  Laterality: N/A;   NO PAST SURGERIES     PERIPHERAL VASCULAR BALLOON  ANGIOPLASTY  03/24/2021   Procedure: PERIPHERAL VASCULAR BALLOON ANGIOPLASTY;  Surgeon: Leonie Douglas, MD;  [facility] INVASIVE CV LAB;  Service: Cardiovascular;;  bilateral common iliacs   PERIPHERAL VASCULAR THROMBECTOMY N/A 03/23/2021   Procedure: PERIPHERAL VASCULAR THROMBECTOMY;  Surgeon: Maeola Harman, MD;  Location: [facility] INVASIVE CV LAB;  Service: Cardiovascular;  Laterality: N/A;   PERIPHERAL VASCULAR THROMBECTOMY N/A 03/24/2021   Procedure: LYSIS RECHECK;  Surgeon: Leonie Douglas, MD;  Location: [facility] INVASIVE CV LAB;  Service: Cardiovascular;  Laterality: N/A;    I have reviewed the social history and family history with the patient and they are unchanged from previous note.  ALLERGIES:  has No Known Allergies.  MEDICATIONS:  Current Outpatient Medications  Medication Sig Dispense Refill   clotrimazole (CLOTRIMAZOLE 3) 2 % vaginal cream Place 1 Applicatorful vaginally at bedtime. 21 g 0   acetaZOLAMIDE (DIAMOX) 125 MG tablet Take 2 tablets (250 mg total) by mouth 2 (two) times daily. 120 tablet 5   benzonatate (TESSALON) 100 MG capsule Take 1 capsule (100 mg total) by mouth 3 (three) times daily. 20 capsule 0   docusate sodium (COLACE) 100 MG capsule Take 1 capsule (100 mg total) by mouth at bedtime. 10 capsule 0   ferrous fumarate-b12-vitamic C-folic acid (TRINSICON / FOLTRIN) capsule Take 1 capsule by mouth 2 (two) times daily after a meal. 180 capsule 0   metoprolol succinate (TOPROL-XL) 25 MG 24 hr tablet Take 1 tablet (25 mg total) by mouth daily. (Patient not taking: Reported on 05/02/2021) 30 tablet 1   polyethylene glycol (MIRALAX / GLYCOLAX) 17 g packet Take 17 g by mouth daily. 14 each 0   Prenatal Vit-Fe Fumarate-FA (PRENATAL VITAMIN) 27-0.8 MG TABS Take 1 tablet by mouth daily. 30 tablet 12   Vitamin D, Ergocalciferol, (DRISDOL) 1.25 MG (50000 UNIT) CAPS capsule Take 50,000 Units by mouth every Wednesday.     warfarin (COUMADIN) 10 MG tablet Take 1 tablet by  mouth once daily on Wednesdays and Fridays 30 tablet 1   warfarin (COUMADIN) 7.5 MG tablet Take 1 tablet (7.5 mg total) by mouth one time only at 4 PM. 30 tablet 0   No current facility-administered medications for this visit.    PHYSICAL EXAMINATION: ECOG PERFORMANCE STATUS: 0 - Asymptomatic  Vitals:   05/11/21 1039  BP: 98/62  Pulse: 68  Resp: 18  Temp: (!) 97.3 F (36.3 C)  SpO2: 100%   Filed Weights   05/11/21 1039  Weight: 154 lb 1.6 oz (69.9 kg)    GENERAL:alert, no distress and comfortable EYES: sclera clear LYMPH: Palpable nodularity in the right axilla with small open wound, tender to touch LUNGS: normal breathing effort HEART: no lower extremity edema NEURO: alert & oriented x 3 with fluent speech, no focal motor/sensory deficits GU/GYN: External genitalia with mild erythema no obvious discharge.  Internal exam deferred  LABORATORY DATA:  I have reviewed the data as listed CBC Latest Ref Rng & Units 05/11/2021 04/20/2021 04/18/2021  WBC 4.0 - 10.5 K/uL 4.9 4.4 5.1  Hemoglobin 12.0 - 15.0 g/dL 09.3 26.7 11.6(L)  Hematocrit 36.0 - 46.0 % 39.6 38.4 37.0  Platelets 150 - 400 K/uL 187 210 208     CMP Latest Ref Rng & Units 04/08/2021 04/05/2021 04/03/2021  Glucose 70 -  99 mg/dL 95 - 104(H)  BUN 6 - 20 mg/dL 5(L) - 6  Creatinine 0.44 - 1.00 mg/dL 0.58 - 0.62  Sodium 135 - 145 mmol/L 135 - 133(L)  Potassium 3.5 - 5.1 mmol/L 3.9 - 3.8  Chloride 98 - 111 mmol/L 106 - 102  CO2 22 - 32 mmol/L 18(L) - 22  Calcium 8.9 - 10.3 mg/dL 9.4 - 8.8(L)  Total Protein 6.5 - 8.1 g/dL - 7.6 7.4  Total Bilirubin 0.3 - 1.2 mg/dL - 0.2(L) 0.5  Alkaline Phos 38 - 126 U/L - 305(H) 359(H)  AST 15 - 41 U/L - 37 175(H)  ALT 0 - 44 U/L - 79(H) 145(H)      RADIOGRAPHIC STUDIES: I have personally reviewed the radiological images as listed and agreed with the findings in the report. No results found.   ASSESSMENT & PLAN:  30 year old female   1.  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  -Most recently she developed acute lower extremity DVT on Xarelto, concerning for Xarelto failure.  Xarelto was stopped and she was treated with heparin in the hospital, and transition to Coumadin - discharged home on Coumadin 7.5 mg daily and Lovenox bridge, goal INR 2.5-3.5 -she reached therapeutic INR x2 on coumadin and lovenox was discontinued. She is on 7.5 mg daily except 10 mg Wednesday and Friday -Ms. Oltmann appears stable. No signs of recurrent thrombosis or bleeding. INR 3.6, I recommend to hold coumadin today 11/10, then resume on 11/11 with 7.5 mg once daily except 10 mg on wednesdays -lab weekly x6, f/up in 6 weeks  2. Vaginal itching -symptoms are external, not UTI-like sx, likely yeast infection -due to DDI  between diflucan and coumadin, I prescribed topical clotrimazole and encouraged her to f/up with PCP   3. Right axillary nodularity and open sore -She developed painful lump in right axilla recently and a small open sore. -she recently stopped nursing in 04/2021 during hospitalization. Unclear if this represents mastitis, abscess, adenopathy, or other.  -I have referred her for R ax [place] for further evaluation. I will call her with results.   PLAN: -INR reviewed, 3.6 (goal 2.5 - 3.5), hold coumadin 11/10. On 11/11, resume 7.5 once daily except 10 mg on Wednesdays  -Topical Clotrimazole PRN -R  ax [place] for nodularity and open sore, will call with results  -F/up with PCP -Lab weekly x6 -F/up in 6 weeks    Orders Placed This Encounter  Procedures   US BREAST COMPLETE UNI RIGHT INC AXILLA    Standing Status:   Future    Standing Expiration Date:   05/11/2022    Order Specific Question:   Reason for Exam (SYMPTOM  OR DIAGNOSIS REQUIRED)    Answer:   right axillary nodularity and open wound recently stopped breast feeding    Order Specific Question:   Preferred imaging location?    Answer:   [facility]   All questions were answered. The patient knows to call the clinic with any problems,  questions or concerns. No barriers to learning were detected. Total encounter time was 30 minutes.      [name], NP 05/11/21

## 2021-05-14 NOTE — Progress Notes (Incomplete)
New Patient Office Visit  Subjective:  Patient ID: Andrea Hayes, female    DOB: [birth year]  Age: 30 y.o. MRN: [id number]  CC: No chief complaint on file.   HPI Andrea Hayes presents for  PCV 20 Flu   This patient will see you as a new pt on 11/14. We follow her for recurrent thrombosis on lifelong anticoagulation (coumadin). I saw her today, she is doing well with AC, I adjusted her dose a little. I prescribed topical clotrimazole for likely yeast infection (due to DDI with coumadin and diflucan). She also recently stopped breast feeding and developed a painful lump in her R axilla and a small open sore. I have referred her for R ax [place] for further evaluation. Just wanted to loop you in. If you have any questions or concerns please let me know.  Thanks,  Clayborn Heron, NP  Admit date:     03/31/2021  Discharge date: 04/09/2021  Attending Physician: Meredeth Ide [[id number]]  Discharge Physician: Lynden Oxford    PCP: Fleet Contras, MD             Recommendations at discharge: Establish care with Coumadin clinic INR checkup on Monday..  Follow-up with PCP in 1 week.   Discharge Diagnoses Principal Problem:   Lower extremity pain, anterior, left Active Problems:   Abdominal pain   Fecal occult blood test positive   Anemia   Hospital Course   Acute DVT of left lower extremity -Patient has extensive history of venous thromboembolism -S/p mechanical thrombectomy involving right common iliac, right external iliac and bilateral internal iliac veins during previous admission -Presented with left leg pain during this admission -Venous duplex of lower extremity showed deep vein thrombosis ;  left femoral vein, left posterior tibial veins -CT abdomen/pelvis during this admission showed no evidence of mesenteric ischemia, persistent but improved  thrombosis involving right common iliac, right external leg and bilateral internal iliac veins.  IVC thrombus has resolved. -Vascular surgery was  consulted; no further intervention recommended for new DVT of left lower extremity   Anticoagulation -Consulted hematology/oncology; anticoagulation switched from Xarelto to IV heparin gtt. along with warfarin ;goal of INR between 2.5-3.5 -Patient to follow-up with hematology/oncology,  Dr. Mosetta Putt as outpatient INR still subtherapeutic. Currently agreeable to transition to Lovenox.  Referral placed for Coumadin clinic.  Dr. Mosetta Putt PCP will manage warfarin.   Melena -FOBT was positive -Patient also had epigastric pain -Hemoglobin dropped to 9.7 today EGD colonoscopy shows no evidence of acute bleeding.  Continue PPI.  GI signed off.  H&H stable.   Hypertension -Continue metoprolol   Idiopathic intracranial hypertension chronic headache -Continue acetazolamide   History of cerebral vein thrombosis, pulmonary embolism -On anticoagulation as above   Transaminitis -LFT mildly elevated.   -She has elevated A1 AT and ceruloplasmin, negative AMA/ANA/ASMA.  Outpatient follow-up.   Cough and runny nose. Patient is concerned regarding COVID.  COVID-negative. Symptoms resolved.    Past Medical History:  Diagnosis Date   Stroke [facility])     Past Surgical History:  Procedure Laterality Date   BIOPSY  04/05/2021   Procedure: BIOPSY;  Surgeon: Jenel Lucks, MD;  Location: [facility] ENDOSCOPY;  Service: Gastroenterology;;   COLONOSCOPY N/A 04/05/2021   Procedure: COLONOSCOPY;  Surgeon: Jenel Lucks, MD;  Location: [facility] ENDOSCOPY;  Service: Gastroenterology;  Laterality: N/A;   ESOPHAGOGASTRODUODENOSCOPY N/A 04/05/2021   Procedure: ESOPHAGOGASTRODUODENOSCOPY (EGD);  Surgeon: Jenel Lucks, MD;  Location: [facility] ENDOSCOPY;  Service: Gastroenterology;  Laterality: N/A;  NO PAST SURGERIES     PERIPHERAL VASCULAR BALLOON ANGIOPLASTY  03/24/2021   Procedure: PERIPHERAL VASCULAR BALLOON ANGIOPLASTY;  Surgeon: Leonie Douglas, MD;  Location: [facility] INVASIVE CV LAB;  Service: Cardiovascular;;   bilateral common iliacs   PERIPHERAL VASCULAR THROMBECTOMY N/A 03/23/2021   Procedure: PERIPHERAL VASCULAR THROMBECTOMY;  Surgeon: Maeola Harman, MD;  Location: [facility] INVASIVE CV LAB;  Service: Cardiovascular;  Laterality: N/A;   PERIPHERAL VASCULAR THROMBECTOMY N/A 03/24/2021   Procedure: LYSIS RECHECK;  Surgeon: Leonie Douglas, MD;  Location: [facility] INVASIVE CV LAB;  Service: Cardiovascular;  Laterality: N/A;    Family History  Problem Relation Age of Onset   Asthma Father    Obesity Neg Hx    Hypertension Neg Hx    Diabetes Neg Hx    Cancer Neg Hx     Social History   Socioeconomic History   Marital status: Single    Spouse name: Not on file   Number of children: Not on file   Years of education: Not on file   Highest education level: Not on file  Occupational History   Not on file  Tobacco Use   Smoking status: Never   Smokeless tobacco: Never  Vaping Use   Vaping Use: Never used  Substance and Sexual Activity   Alcohol use: No    Alcohol/week: 0.0 standard drinks   Drug use: No   Sexual activity: Yes    Birth control/protection: None  Other Topics Concern   Not on file  Social History Narrative   Right handed   Social Determinants of Health   Financial Resource Strain: Not on file  Food Insecurity: Not on file  Transportation Needs: Not on file  Physical Activity: Not on file  Stress: Not on file  Social Connections: Not on file  Intimate Partner Violence: Not on file    ROS Review of Systems  Objective:   Today's Vitals: There were no vitals taken for this visit.  Physical Exam  Assessment & Plan:   Problem List Items Addressed This Visit   None   Outpatient Encounter Medications as of 05/15/2021  Medication Sig   acetaZOLAMIDE (DIAMOX) 125 MG tablet Take 2 tablets (250 mg total) by mouth 2 (two) times daily.   benzonatate (TESSALON) 100 MG capsule Take 1 capsule (100 mg total) by mouth 3 (three) times daily.    clotrimazole (CLOTRIMAZOLE 3) 2 % vaginal cream Place 1 Applicatorful vaginally at bedtime.   docusate sodium (COLACE) 100 MG capsule Take 1 capsule (100 mg total) by mouth at bedtime.   ferrous fumarate-b12-vitamic C-folic acid (TRINSICON / FOLTRIN) capsule Take 1 capsule by mouth 2 (two) times daily after a meal.   metoprolol succinate (TOPROL-XL) 25 MG 24 hr tablet Take 1 tablet (25 mg total) by mouth daily. (Patient not taking: Reported on 05/02/2021)   polyethylene glycol (MIRALAX / GLYCOLAX) 17 g packet Take 17 g by mouth daily.   Prenatal Vit-Fe Fumarate-FA (PRENATAL VITAMIN) 27-0.8 MG TABS Take 1 tablet by mouth daily.   Vitamin D, Ergocalciferol, (DRISDOL) 1.25 MG (50000 UNIT) CAPS capsule Take 50,000 Units by mouth every Wednesday.   warfarin (COUMADIN) 10 MG tablet Take 1 tablet by mouth once daily on Wednesdays and Fridays   warfarin (COUMADIN) 7.5 MG tablet Take 1 tablet (7.5 mg total) by mouth one time only at 4 PM.   No facility-administered encounter medications on file as of 05/15/2021.    Follow-up: No follow-ups on file.   Shan Levans, MD

## 2021-05-15 ENCOUNTER — Other Ambulatory Visit: Payer: Self-pay | Admitting: Nurse Practitioner

## 2021-05-15 ENCOUNTER — Telehealth: Payer: Self-pay | Admitting: Nurse Practitioner

## 2021-05-15 ENCOUNTER — Telehealth: Payer: Self-pay

## 2021-05-15 ENCOUNTER — Ambulatory Visit: Payer: 59 | Admitting: Critical Care Medicine

## 2021-05-15 DIAGNOSIS — R2231 Localized swelling, mass and lump, right upper limb: Secondary | ICD-10-CM

## 2021-05-15 NOTE — Telephone Encounter (Signed)
Scheduled follow-up appointments per 11/10 los. Patient is aware. 

## 2021-05-15 NOTE — Telephone Encounter (Signed)
Pt request message be sent through MyChart.

## 2021-05-17 ENCOUNTER — Inpatient Hospital Stay: Payer: 59

## 2021-05-17 ENCOUNTER — Other Ambulatory Visit: Payer: Self-pay

## 2021-05-17 DIAGNOSIS — I82220 Acute embolism and thrombosis of inferior vena cava: Secondary | ICD-10-CM | POA: Diagnosis not present

## 2021-05-17 LAB — CBC WITH DIFFERENTIAL (CANCER CENTER ONLY)
Abs Immature Granulocytes: 0.02 10*3/uL (ref 0.00–0.07)
Basophils Absolute: 0.1 10*3/uL (ref 0.0–0.1)
Basophils Relative: 1 %
Eosinophils Absolute: 0.2 10*3/uL (ref 0.0–0.5)
Eosinophils Relative: 3 %
HCT: 37.9 % (ref 36.0–46.0)
Hemoglobin: 12.1 g/dL (ref 12.0–15.0)
Immature Granulocytes: 0 %
Lymphocytes Relative: 28 %
Lymphs Abs: 1.4 10*3/uL (ref 0.7–4.0)
MCH: 25.4 pg — ABNORMAL LOW (ref 26.0–34.0)
MCHC: 31.9 g/dL (ref 30.0–36.0)
MCV: 79.5 fL — ABNORMAL LOW (ref 80.0–100.0)
Monocytes Absolute: 0.4 10*3/uL (ref 0.1–1.0)
Monocytes Relative: 8 %
Neutro Abs: 3 10*3/uL (ref 1.7–7.7)
Neutrophils Relative %: 60 %
Platelet Count: 191 10*3/uL (ref 150–400)
RBC: 4.77 MIL/uL (ref 3.87–5.11)
RDW: 15.9 % — ABNORMAL HIGH (ref 11.5–15.5)
WBC Count: 5.1 10*3/uL (ref 4.0–10.5)
nRBC: 0 % (ref 0.0–0.2)

## 2021-05-17 LAB — PROTIME-INR
INR: 2.8 — ABNORMAL HIGH (ref 0.8–1.2)
Prothrombin Time: 29.5 seconds — ABNORMAL HIGH (ref 11.4–15.2)

## 2021-05-24 ENCOUNTER — Inpatient Hospital Stay: Payer: 59

## 2021-05-24 ENCOUNTER — Other Ambulatory Visit: Payer: Self-pay

## 2021-05-24 ENCOUNTER — Telehealth: Payer: Self-pay

## 2021-05-24 DIAGNOSIS — I82220 Acute embolism and thrombosis of inferior vena cava: Secondary | ICD-10-CM

## 2021-05-24 LAB — PROTIME-INR
INR: 2.3 — ABNORMAL HIGH (ref 0.8–1.2)
Prothrombin Time: 25.4 seconds — ABNORMAL HIGH (ref 11.4–15.2)

## 2021-05-24 NOTE — Telephone Encounter (Signed)
-----   Message from Pollyann Samples, NP sent at 05/24/2021  1:41 PM EST ----- Please let her know INR is low again, subtherapeutic. Make sure no missed/double doses. Go back to 10 mg Wednesdays and Fridays, and 7.5 mg all other days.  Thanks, Clayborn Heron, NP

## 2021-05-24 NOTE — Telephone Encounter (Signed)
This nurse reached out to patient related to below mentioned  lab results.  No answer and unable to leave a message for the patient.  This nurse will send a message on My Chart for the patient and will follow up again.  No further concerns at this time.

## 2021-05-31 ENCOUNTER — Other Ambulatory Visit: Payer: Self-pay

## 2021-05-31 ENCOUNTER — Inpatient Hospital Stay: Payer: 59

## 2021-05-31 DIAGNOSIS — I82220 Acute embolism and thrombosis of inferior vena cava: Secondary | ICD-10-CM | POA: Diagnosis not present

## 2021-05-31 LAB — PROTIME-INR
INR: 2.8 — ABNORMAL HIGH (ref 0.8–1.2)
Prothrombin Time: 29.7 seconds — ABNORMAL HIGH (ref 11.4–15.2)

## 2021-05-31 NOTE — Progress Notes (Signed)
This nurse communicated lab results and recommendations for this patient via My Chart message which is patients requested form of communication.  No further concerns at this time.

## 2021-06-02 ENCOUNTER — Other Ambulatory Visit: Payer: Self-pay | Admitting: *Deleted

## 2021-06-02 ENCOUNTER — Other Ambulatory Visit: Payer: Self-pay

## 2021-06-02 NOTE — Patient Outreach (Signed)
[facility]) Care Management Telephonic RN Care Manager Note   06/02/2021 Name:  Andrea Hayes MRN:  [id number] DOB:  09-16-1990    Subjective: Andrea Hayes is an 30 y.o. year old female who is a primary patient of System, Provider Not In. The care management team was consulted for assistance with care management and/or care coordination needs.    Telephonic RN Care Manager completed Telephone Visit today.   Objective:  Medications Reviewed Today     Reviewed by Pollyann Samples, NP (Nurse Practitioner) on 05/11/21 at 1445  Med List Status: <None>   Medication Order Taking? Sig Documenting Provider Last Dose Status Informant  acetaZOLAMIDE (DIAMOX) 125 MG tablet [id number] No Take 2 tablets (250 mg total) by mouth 2 (two) times daily. Drema Dallas, DO Taking Active Self  benzonatate (TESSALON) 100 MG capsule [id number] No Take 1 capsule (100 mg total) by mouth 3 (three) times daily. Rolly Salter, MD Taking Active   clotrimazole (CLOTRIMAZOLE 3) 2 % vaginal cream [id number] Yes Place 1 Applicatorful vaginally at bedtime. Pollyann Samples, NP  Active   docusate sodium (COLACE) 100 MG capsule [id number] No Take 1 capsule (100 mg total) by mouth at bedtime. Rolly Salter, MD Taking Active   ferrous fumarate-b12-vitamic C-folic acid (TRINSICON / FOLTRIN) capsule [id number] No Take 1 capsule by mouth 2 (two) times daily after a meal. Pollyann Samples, NP Taking Active   metoprolol succinate (TOPROL-XL) 25 MG 24 hr tablet [id number] No Take 1 tablet (25 mg total) by mouth daily.  Patient not taking: Reported on 05/02/2021   Arnetha Courser, MD Not Taking Active Self  polyethylene glycol (MIRALAX / GLYCOLAX) 17 g packet [id number] No Take 17 g by mouth daily. Rolly Salter, MD Taking Active   Prenatal Vit-Fe Fumarate-FA (PRENATAL VITAMIN) 27-0.8 MG TABS [id number] No Take 1 tablet by mouth daily. Aviva Signs, CNM Taking Active   Vitamin D, Ergocalciferol, (DRISDOL) 1.25 MG  (50000 UNIT) CAPS capsule [id number] No Take 50,000 Units by mouth every Wednesday. [provider] Taking Active Self  warfarin (COUMADIN) 10 MG tablet [id number] No Take 1 tablet by mouth once daily on Wednesdays and Fridays Pollyann Samples, NP Taking Active   warfarin (COUMADIN) 7.5 MG tablet [id number] No Take 1 tablet (7.5 mg total) by mouth one time only at 4 PM. Pollyann Samples, NP Taking Active              SDOH:  (Social Determinants of Health) assessments and interventions performed:  SDOH Interventions    Flowsheet Row Most Recent Value  SDOH Interventions   Food Insecurity Interventions Intervention Not Indicated  Financial Strain Interventions Intervention Not Indicated  Housing Interventions Intervention Not Indicated  Intimate Partner Violence Interventions Intervention Not Indicated  Stress Interventions Intervention Not Indicated  [has newborn]  Social Connections Interventions Intervention Not Indicated  Transportation Interventions Intervention Not Indicated       Care Plan  Review of patient past medical history, allergies, medications, health status, including review of consultants reports, laboratory and other test data, was performed as part of comprehensive evaluation for care management services.   Care Plan : RN Care Manager Plan of Care  Updates made by Clinton Gallant, RN since 06/02/2021 12:00 AM     Problem: Knowledge deficit related to deep vein thrombosis/stroke management and care coordination needs   Priority: High     Long-Range Goal: Establish Plan of Care for Management Complex SDOH Barriers and Care  Coordination Needs in patient with DVT and stroke   Start Date: 05/04/2021  This Visit's Progress: On track  Priority: High  Note:   Current Barriers:  Knowledge Deficits related to plan of care for management of deep vein thrombosis, stroke Care Coordination needs related to Limited education about deep vein thrombosis,  anticoagulants, strokes* Summary: Follow up with patient to assess for any worsening symptoms related to DVT or care coordination needs Reviewed recent labs and discussed related to diet Reviewed possible changes in coverage from [employer] No leg pain, swelling, no headaches Continues to work and care for newborn with support of her mother at less intervals Reviewed upcoming appointments with her Confirms she has [facility] and food stamps     Recommendations/Changes made from today's visit: Encouraged to follow up with local Department of Social Services (DSS) for completion of 2022 medicaid forms to continue services for 2023 for  her and newborn  RN CM Clinical Goal(s):  Patient will verbalize basic understanding of  deep vein thrombosis, stroke disease process and self health management plan for deep vein thrombosis, stroke to include use of anticoagulants and diet take all medications exactly as prescribed and will call provider for medication related questions demonstrate Improved adherence to prescribed treatment plan for deep vein thrombosis, stroke as evidenced by therapeutic INR values  through collaboration with RN Care manager, provider, and care team.   Interventions: Follow up outreaches to review labs, review upcoming appointments, to educate and answer questions Inter-disciplinary care team collaboration (see longitudinal plan of care) Evaluation of current treatment plan related to  self management and patient's adherence to plan as established by provider  Deep vein thrombosis, stroke Goal on track:  Yes. Evaluation of current treatment plan related to  deep vein thrombosis, stroke , Limited education about home management, medicine, diet related to deep vein thrombosis, stroke* self-management and patient's adherence to plan as established by provider. Discussed plans with patient for ongoing care management follow up and provided patient with direct contact information for  care management team Evaluation of current treatment plan related to deep vein thrombosis, stroke and patient's adherence to plan as established by provider; Provided education to patient re: DVT, Stroke, Vitamin K high, medium, low foods, anticoagulants (Lovenox), bleeding risks, INR values with therapeutic range of 2.5-3.5, fatigue, sleep/activity; Reviewed medications with patient and discussed action plan, worsening symptoms to call MD office for;  Patient Goals/Self-Care Activities: Perform all self care activities independently  Perform IADL's (shopping, preparing meals, housekeeping, managing finances) independently Call provider office for new concerns or questions   Follow Up Plan:  The patient has been provided with contact information for the care management team and has been advised to call with any health related questions or concerns.  Will warm transfer patient to managed medicaid services after this was discussed with her and she agreed to continue to be followed prn          Plan: The patient has been provided with contact information for the care management team and has been advised to call with any health related questions or concerns.  Will warm transfer patient to managed medicaid services after this was discussed with her and she agreed to continue to be followed prn   Lorali Khamis L. Noelle Penner, RN, BSN, CCM [facility] Telephonic Care Management Care Coordinator Office number [phone] Main [facility] number [phone] Fax number [phone]

## 2021-06-06 ENCOUNTER — Other Ambulatory Visit: Payer: Self-pay

## 2021-06-06 ENCOUNTER — Ambulatory Visit: Payer: 59 | Admitting: Critical Care Medicine

## 2021-06-06 NOTE — Patient Instructions (Signed)
Visit Information  Andrea Hayes was given information about Medicaid Managed Care team care coordination services as a part of their [facility] Medicaid benefit. Andrea Hayes verbally consented to engagement with the [facility] Managed Care team.   If you are experiencing a medical emergency, please call 911 or report to your local emergency department or urgent care.   If you have a non-emergency medical problem during routine business hours, please contact your provider's office and ask to speak with a nurse.   For questions related to your [facility] health plan, please call: [phone] or go here:[email or web address]  If you would like to schedule transportation through your [facility] plan, please call the following number at least 2 days in advance of your appointment: [phone].  Call the [place] at [phone], at any time, 24 hours a day, 7 days a week. If you are in danger or need immediate medical attention call 911.  If you would like help to quit smoking, call 1-800-QUIT-NOW 608-254-8840) OR Espaol: 1-855-Djelo-Ya (5-102-585-2778) o para ms informacin haga clic aqu or Text READY to 200-400 to register via text  Andrea Hayes - following are the goals we discussed in your visit today:   Goals Addressed   None     Please see education materials related to today's visit provided by MyChart link.  The patient verbalized understanding of today's visit information and will review information vis My Chart link.  The Managed Medicaid care management team will reach out to the patient again over the next 30 days.   Salvatore Marvel RN, BSN Community Care Coordinator [facility] Mobile: [phone]   Following is a copy of your plan of care:  Care Plan : [place] of Care  Updates made by Inge Rise, RN since 06/06/2021 12:00 AM     Problem: Chronic Disease Management and Care  Coordination Needs for Deep Vein Thrombosis/stroke management and HTN   Priority: High  Onset Date: 06/06/2021     Long-Range Goal: Development of Plan of Care for Management and Care Coordination Needs (DVT/stroke management and HTN   Start Date: 06/06/2021  Expected End Date: 10/04/2021  Recent Progress: On track  Priority: High  Note:   Current Barriers:  Knowledge Deficits related to plan of care for management of HTN and deep vein thrombosis, stroke  Care Coordination needs related to Limited education about deep vein thrombosis, anticoagulants, strokes* Chronic Disease Management support and education needs related to HTN and deep vein thrombosis, stroke.    Recommendations/Changes made from today's visit: Encouraged to follow up with local Department of Social Services (DSS) for completion of 2022 medicaid forms to continue services for 2023 for her and newborn.  Patient has not yet contacted DSS but plans to contact them in near future to complete medicaid forms for continued services for 2023.  RN CM Clinical Goal(s):  Patient will verbalize understanding of plan for management of HTN and recurrent Deep Vein Thrombosis (DVT), stroke as evidenced by good control of HTN and recurrent DVT verbalize basic understanding of  HTN and deep vein thrombosis, stroke disease process and self health management plan as evidenced by for deep vein thrombosis, stroke to include use of anticoagulants and diet and hypertension take all medications exactly as prescribed and will call provider for medication related questions as evidenced by medication compliance. attend all scheduled medical appointments: numerous December 2022 appointments as evidenced by attending all scheduled appointments demonstrate Improved and Ongoing adherence  to prescribed treatment plan for HTN and deep vein thrombosis, stroke as evidenced by therapeutic INR values continue to work with RN Care Manager to address care management  and care coordination needs related to  HTN and recurrent DVT, stroke prevention as evidenced by adherence to CM Team Scheduled appointments through collaboration with RN Care manager, provider, and care team.   Interventions: Follow up outreaches to review labs, review upcoming appointments, to educate and answer questions Inter-disciplinary care team collaboration (see longitudinal plan of care) Evaluation of current treatment plan related to  self management and patient's adherence to plan as established by provider Follow up with patient to assess for any worsening symptoms related to DVT or care coordination needs Reviewed recent labs and discussed related to diet No reported leg pain, swelling, no headaches Reviewed upcoming appointments with patient.  Deep vein thrombosis, stroke Goal on track:  Yes. Evaluation of current treatment plan related to  deep vein thrombosis, stroke , Limited education about home management, medicine, diet related to deep vein thrombosis, stroke* self-management and patient's adherence to plan as established by provider. Discussed plans with patient for ongoing care management follow up and provided patient with direct contact information for care management team Evaluation of current treatment plan related to deep vein thrombosis, stroke and patient's adherence to plan as established by provider; Provided education to patient re: DVT, Stroke, Vitamin K high, medium, low foods, anticoagulants, bleeding risks, INR values with therapeutic range of 2.5-3.5; Reviewed medications with patient and discussed action plan, worsening symptoms to call MD office for any changes;   Hypertension Interventions:  (Status:  New goal.) Long Term Goal Last practice recorded BP readings:  BP Readings from Last 3 Encounters:  05/11/21 98/62  05/02/21 132/74  04/13/21 102/68  Most recent eGFR/CrCl: No results found for: EGFR  No components found for: CRCL  Evaluation of  current treatment plan related to hypertension self management and patient's adherence to plan as established by provider Reviewed medications with patient and discussed importance of compliance Discussed plans with patient for ongoing care management follow up and provided patient with direct contact information for care management team Reviewed scheduled/upcoming provider appointments including:  Provided education on prescribed diet Low Potassium. Assessed social determinant of health barriers   Patient Goals/Self-Care Activities: Take all medications as prescribed Attend all scheduled provider appointments Perform all self care activities independently  Perform IADL's (shopping, preparing meals, housekeeping, managing finances) independently Call provider office for new concerns or questions   Follow Up Plan:  The patient has been provided with contact information for the care management team and has been advised to call with any health related questions or concerns.

## 2021-06-06 NOTE — Patient Outreach (Signed)
Medicaid Managed Care   Nurse Care Manager Note  06/06/2021 Name:  Andrea Hayes MRN:  [id number] DOB:  Nov 17, 1990  Andrea Hayes is an 30 y.o. year old female who is a primary patient of Andrea Ebbs, MD.  The [facility] Managed Care Coordination team was consulted for assistance with:    HTN Deep Vein Thrombosis/stroke  Ms. Balderrama was given information about Medicaid Managed Care Coordination team services today. Andrea Hayes Patient agreed to services and verbal consent obtained.  Engaged with patient by telephone for initial visit in response to provider referral for case management and/or care coordination services.   Assessments/Interventions:  Review of past medical history, allergies, medications, health status, including review of consultants reports, laboratory and other test data, was performed as part of comprehensive evaluation and provision of chronic care management services.  SDOH (Social Determinants of Health) assessments and interventions performed: SDOH Interventions    Flowsheet Row Most Recent Value  SDOH Interventions   Food Insecurity Interventions Intervention Not Indicated  Financial Strain Interventions Intervention Not Indicated  Housing Interventions Intervention Not Indicated  Physical Activity Interventions Intervention Not Indicated  [Patient works full time 1PM to 9PM and has 30 month old infant.  Currently patient does not have time to exercise.]  Stress Interventions Intervention Not Indicated  Social Connections Interventions Intervention Not Indicated  Transportation Interventions Intervention Not Indicated       Care Plan  No Known Allergies  Medications Reviewed Today     Reviewed by Inge Rise, RN (Case Manager) on 06/06/21 at [place] List Status: <None>   Medication Order Taking? Sig Documenting Provider Last Dose Status Informant  acetaZOLAMIDE (DIAMOX) 125 MG tablet [id number]  Take 2 tablets (250 mg total) by mouth 2  (two) times daily. [name], DO  Active Self  acetaZOLAMIDE (DIAMOX) 250 MG tablet [id number] Yes Take 500 mg by mouth 2 (two) times daily. [provider] Taking Active   benzonatate (TESSALON) 100 MG capsule [id number]  Take 1 capsule (100 mg total) by mouth 3 (three) times daily. Lavina Hamman, MD  Active   clotrimazole (CLOTRIMAZOLE 3) 2 % vaginal cream [id number] Yes Place 1 Applicatorful vaginally at bedtime. Andrea Feeling, NP Taking Active   docusate sodium (COLACE) 100 MG capsule [id number]  Take 1 capsule (100 mg total) by mouth at bedtime. Lavina Hamman, MD  Active   ferrous [id number] C-folic acid (TRINSICON / FOLTRIN) capsule [id number] Yes Take 1 capsule by mouth 2 (two) times daily after a meal. Andrea Feeling, NP Taking Active   HYDROcodone-acetaminophen (NORCO/VICODIN) 5-325 MG tablet [id number] Yes Take 1-2 tablets by mouth every 4 (four) hours as needed. [provider] Taking Active   metoprolol succinate (TOPROL-XL) 25 MG 24 hr tablet [id number] Yes Take 1 tablet (25 mg total) by mouth daily. Andrea Nimrod, MD Taking Active Self  polyethylene glycol (MIRALAX / GLYCOLAX) 17 g packet [id number] Yes Take 17 g by mouth daily. Lavina Hamman, MD Taking Active   Prenatal Vit-Fe Fumarate-FA (PRENATAL VITAMIN) 27-0.8 MG TABS [id number] No Take 1 tablet by mouth daily.  Patient not taking: Reported on 06/06/2021   Andrea Hayes, CNM Not Taking Active   Vitamin D, Ergocalciferol, (DRISDOL) 1.25 MG (50000 UNIT) CAPS capsule [id number] Yes Take 50,000 Units by mouth every Wednesday. [provider] Taking Active Self  warfarin (COUMADIN) 10 MG tablet [id number]  Take 1 tablet by mouth once daily on Wednesdays and Fridays Andrea Feeling, NP  Active  warfarin (COUMADIN) 7.5 MG tablet [id number]  Take 1 tablet (7.5 mg total) by mouth one time only at 4 PM. 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   Acute respiratory failure with hypoxia (HCC) 01/26/2021   Chronic right arterial ischemic stroke, MCA (middle cerebral artery) 01/23/2021   Pulmonary embolism ([place]) 01/23/2021   Leukocytosis 01/23/2021   Thrombocytopenia ([place]) 01/23/2021   Elevated liver enzymes 01/23/2021   Chest pain 01/23/2021   Back pain 01/23/2021   Vaginal delivery 12/05/2020   Headache 11/28/2020   HTN (hypertension) 11/28/2020   Severe preeclampsia, delivered 11/04/2020   IUGR (intrauterine growth restriction) affecting care of mother 10/31/2020   Thrombocytopenia affecting pregnancy ([place]) 05/27/2020   Acute right arterial ischemic stroke, middle cerebral artery (MCA) (HCC)    Dural venous sinus thrombosis    Idiopathic intracranial hypertension    Cerebral venous thrombosis 07/24/2018    Conditions to be addressed/monitored per PCP order:  HTN and Deep Vein Thrombosis/stroke  Care Plan : RN Care Manager Plan of Care  Updates made by Inge Rise, RN since 06/06/2021 12:00 AM     Problem: Chronic Disease Management and Care Coordination Needs for Deep Vein Thrombosis/stroke management and HTN   Priority: High  Onset Date: 06/06/2021     Long-Range Goal: Development of Plan of Care for Management and Care Coordination Needs (DVT/stroke management and HTN   Start Date: 06/06/2021  Expected End Date: 10/04/2021  Recent Progress: On track  Priority: High  Note:   Current Barriers:  Knowledge Deficits related to plan of care for management of HTN and deep vein thrombosis, stroke  Care Coordination needs related to Limited education about deep vein thrombosis, anticoagulants, strokes* Chronic Disease Management support and education needs related to HTN and deep vein thrombosis, stroke.     Recommendations/Changes made from today's visit: Encouraged to follow up with local Department of Social Services (DSS) for completion of 2022 medicaid forms to continue services for 2023 for her and newborn.  Patient has not yet contacted DSS but plans to contact them in near future to complete medicaid forms for continued services for 2023.  RN CM Clinical Goal(s):  Patient will verbalize understanding of plan for management of HTN and recurrent Deep Vein Thrombosis (DVT), stroke as evidenced by good control of HTN and recurrent DVT verbalize basic understanding of  HTN and deep vein thrombosis, stroke disease process and self health management plan as evidenced by for deep vein thrombosis, stroke to include use of anticoagulants and diet and hypertension take all medications exactly as prescribed and will call provider for medication related questions as evidenced by medication compliance. attend all scheduled medical appointments: numerous December 2022 appointments as evidenced by attending all scheduled appointments demonstrate Improved and Ongoing adherence to prescribed treatment plan for HTN and deep vein thrombosis, stroke as evidenced by therapeutic INR values continue to work with RN Care Manager to address care management and care coordination needs related to  HTN and recurrent DVT, stroke prevention as evidenced by adherence to CM Team Scheduled appointments through collaboration with RN Care manager, provider, and care team.   Interventions: Follow up outreaches to  review labs, review upcoming appointments, to educate and answer questions Inter-disciplinary care team collaboration (see longitudinal plan of care) Evaluation of current treatment plan related to  self management and patient's adherence to plan as established by provider Follow up with patient to assess for any worsening symptoms related to DVT or care coordination needs Reviewed recent labs and discussed related to  diet No reported leg pain, swelling, no headaches Reviewed upcoming appointments with patient.  Deep vein thrombosis, stroke Goal on track:  Yes. Evaluation of current treatment plan related to  deep vein thrombosis, stroke , Limited education about home management, medicine, diet related to deep vein thrombosis, stroke* self-management and patient's adherence to plan as established by provider. Discussed plans with patient for ongoing care management follow up and provided patient with direct contact information for care management team Evaluation of current treatment plan related to deep vein thrombosis, stroke and patient's adherence to plan as established by provider; Provided education to patient re: DVT, Stroke, Vitamin K high, medium, low foods, anticoagulants, bleeding risks, INR values with therapeutic range of 2.5-3.5; Reviewed medications with patient and discussed action plan, worsening symptoms to call MD office for any changes;   Hypertension Interventions:  (Status:  New goal.) Long Term Goal Last practice recorded BP readings:  BP Readings from Last 3 Encounters:  05/11/21 98/62  05/02/21 132/74  04/13/21 102/68  Most recent eGFR/CrCl: No results found for: EGFR  No components found for: CRCL  Evaluation of current treatment plan related to hypertension self management and patient's adherence to plan as established by provider Reviewed medications with patient and discussed importance of compliance Discussed plans with patient for ongoing care management follow up and provided patient with direct contact information for care management team Reviewed scheduled/upcoming provider appointments including:  Provided education on prescribed diet Low Potassium. Assessed social determinant of health barriers   Patient Goals/Self-Care Activities: Take all medications as prescribed Attend all scheduled provider appointments Perform all self care activities independently  Perform  IADL's (shopping, preparing meals, housekeeping, managing finances) independently Call provider office for new concerns or questions   Follow Up Plan:  The patient has been provided with contact information for the care management team and has been advised to call with any health related questions or concerns.        Follow Up:  Patient agrees to Care Plan and Follow-up.  Plan: The Managed Medicaid care management team will reach out to the patient again over the next 30 days.  Date/time of next scheduled RN care management/care coordination outreach:  July 04, 2021  [place], [facility] Mobile: [phone]

## 2021-06-07 ENCOUNTER — Inpatient Hospital Stay: Payer: 59 | Attending: Hematology

## 2021-06-07 ENCOUNTER — Other Ambulatory Visit: Payer: Self-pay

## 2021-06-07 DIAGNOSIS — L299 Pruritus, unspecified: Secondary | ICD-10-CM | POA: Insufficient documentation

## 2021-06-07 DIAGNOSIS — I82220 Acute embolism and thrombosis of inferior vena cava: Secondary | ICD-10-CM

## 2021-06-07 DIAGNOSIS — Z86718 Personal history of other venous thrombosis and embolism: Secondary | ICD-10-CM | POA: Diagnosis not present

## 2021-06-07 DIAGNOSIS — L732 Hidradenitis suppurativa: Secondary | ICD-10-CM | POA: Insufficient documentation

## 2021-06-07 DIAGNOSIS — Z7901 Long term (current) use of anticoagulants: Secondary | ICD-10-CM | POA: Insufficient documentation

## 2021-06-07 LAB — CBC WITH DIFFERENTIAL (CANCER CENTER ONLY)
Abs Immature Granulocytes: 0.01 10*3/uL (ref 0.00–0.07)
Basophils Absolute: 0 10*3/uL (ref 0.0–0.1)
Basophils Relative: 1 %
Eosinophils Absolute: 0.1 10*3/uL (ref 0.0–0.5)
Eosinophils Relative: 3 %
HCT: 39.1 % (ref 36.0–46.0)
Hemoglobin: 12.3 g/dL (ref 12.0–15.0)
Immature Granulocytes: 0 %
Lymphocytes Relative: 31 %
Lymphs Abs: 1.5 10*3/uL (ref 0.7–4.0)
MCH: 24.4 pg — ABNORMAL LOW (ref 26.0–34.0)
MCHC: 31.5 g/dL (ref 30.0–36.0)
MCV: 77.6 fL — ABNORMAL LOW (ref 80.0–100.0)
Monocytes Absolute: 0.5 10*3/uL (ref 0.1–1.0)
Monocytes Relative: 10 %
Neutro Abs: 2.7 10*3/uL (ref 1.7–7.7)
Neutrophils Relative %: 55 %
Platelet Count: 204 10*3/uL (ref 150–400)
RBC: 5.04 MIL/uL (ref 3.87–5.11)
RDW: 16.1 % — ABNORMAL HIGH (ref 11.5–15.5)
WBC Count: 4.8 10*3/uL (ref 4.0–10.5)
nRBC: 0 % (ref 0.0–0.2)

## 2021-06-07 LAB — PROTIME-INR
INR: 2.7 — ABNORMAL HIGH (ref 0.8–1.2)
Prothrombin Time: 28.3 seconds — ABNORMAL HIGH (ref 11.4–15.2)

## 2021-06-08 NOTE — Progress Notes (Signed)
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  She is currently with [facility] but will change insurance next month.  Once she has her new insurance, she will contact us with the info and we will refer her to ophthalmology. 2  Follow up 7-8 months.  Subjective:  Andrea Hayes is a 30 year old woman who follow up for idiopathic intracranial hypertension with history of dural venous sinus thrombosis with right MCA stroke.   UPDATE: She is no longer taking acetazolamide 250mg  twice daily.  She does not remember why she stopped it.  She has not had an eye exam.  No headaches until this past Tuesday (involving the eyes as well).  No visual obscurations or pulsatile tinnitus.    Since last visit, she was hospitalized for PE in July and discharged on warfarin.  She was hospitalized in September for infrarenal IVC thrombosis.  She underwent mechanical thrombectomy and discharged on warfarin.   HISTORY: She began experiencing blurred vision and intermittent headaches on top of head.  In October.  She also reports visual obscurations as well.  No pulsatile tinnitus.  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  The next day she went to the ED for further evaluation and treatment.  MRI of brain with and without contrast was personally reviewed and demonstrated tortuous optic nerves with prominent optic nerve sheath fluid which may be seen in setting of intracranial hypertension.  Neurology was consulted and LP was recommended.  However, she declined the procedure and left against medical advice.    She followed up with me in January 2020 and underwent workup for intracranial hypertension.  Lumbar puncture from 07/09/18 demonstrated an opening  pressure of 47 cm water with closing pressure of 16 cm water.  CSF cell count, glucose, protein and culture were normal.  She was started on Diamox 500mg  twice daily.  MRV of head from 07/12/18 was personally reviewed and was normal with no evidence of thrombosis or stenosis.  She presented to [facility] on 07/24/18 for severe headache with nausea, vomiting and photophobia.  CT of head was personally reviewed and showed dural venous sinus thrombosis involving the superior sagittal sinus, straight sinus, right transverse sinus and sigmoid sinus and smaller branching veins.  Follow up CTV of head confirmed acute venous sinus thrombosis from superior sagittal sinus to right sigmoid sinus and within intracerebral veins.  Hypercoagulable workup was unremarkable, including homocystein 4.8, antithrombin III mildly elevated at 128, low protein S activity of 22, total protein S 109, protein C activity 180, total protein C 127, lupus anticoagulant 25.3, DRVVT 44.7, beta-2-glycoprotein antibodies <9, factor V leiden mutation negative, prothrombin gene mutation negative, cardiolipin antibodies negative, sickle cell screen negative and negative for pregnancy.  She was started on lovenox and bridged to warfarin.  She was discharged on Fioricet for her headaches.  She was started on topiramate 50mg  twice daily.  Headache never improved and returned to the ED on 08/04/18. INR was therapeutic at 2.43. CT personally reviewed and showed subacute dural venous sinus thrombosis and intracerebral veins with partial recanalization and no acute component.  She received IV Reglan and Benadryl and headache resolved.  Headaches subsequently subsided, so she self-discontinued medications - warfarin, acetazolamide and topiramate,  and lost to follow up.  She became pregnant in late 2021.  Started having headaches and pulsatile tinnitus again.  MRV of head without contrast on 05/24/2020 showed worsened segmental superior sagittal sinus  thrombosis (compared to CTV 08/05/18) and flow in both transverse and sigmoid sinuses but with chronic scarring of right transverse sinus region, likely sequelae of previous thrombosis/stroke but cannot rule out active/recent thrombosis.  Given that she had prematurely stopped warfarin, we took precaution and had her on Lovenox during her pregnancy.  She was also restarted on acetazolamide 500mg  twice daily.  She was hospitalized for headache on 08/12/2020.  Repeat MRI/MRV of head without contrast at that time were stable.  Headaches are improved.  Denies visual obscurations, blurred vision or pulsatile tinnitus.  She stopped taking the acetazolamide regularly. Underwent induction of labor at 37 weeks on 11/04/2020 and gave birth to a girl.  Baby is healthy. Patient is nursing.  She presented to the hospital on 11/28/2020 for severe headaches  MRI and MRV of brain with and without contrast was negative for acute stroke/bleed or acute venous thrombosis.  She was restarted on acetazolamide 250mg  twice daily.    PAST MEDICAL HISTORY: Past Medical History:  Diagnosis Date   Stroke [facility])     MEDICATIONS: Current Outpatient Medications on File Prior to Visit  Medication Sig Dispense Refill   acetaZOLAMIDE (DIAMOX) 125 MG tablet Take 2 tablets (250 mg total) by mouth 2 (two) times daily. (Patient not taking: Reported on 06/06/2021) 120 tablet 5   acetaZOLAMIDE (DIAMOX) 250 MG tablet Take 500 mg by mouth 2 (two) times daily.     benzonatate (TESSALON) 100 MG capsule Take 1 capsule (100 mg total) by mouth 3 (three) times daily. (Patient not taking: Reported on 06/06/2021) 20 capsule 0   clotrimazole (CLOTRIMAZOLE 3) 2 % vaginal cream Place 1 Applicatorful vaginally at bedtime. 21 g 0   docusate sodium (COLACE) 100 MG capsule Take 1 capsule (100 mg total) by mouth at bedtime. 10 capsule 0   ferrous fumarate-b12-vitamic C-folic acid (TRINSICON / FOLTRIN) capsule Take 1 capsule by mouth 2 (two) times daily after a meal.  180 capsule 0   HYDROcodone-acetaminophen (NORCO/VICODIN) 5-325 MG tablet Take 1-2 tablets by mouth every 4 (four) hours as needed.     metoprolol succinate (TOPROL-XL) 25 MG 24 hr tablet Take 1 tablet (25 mg total) by mouth daily. 30 tablet 1   polyethylene glycol (MIRALAX / GLYCOLAX) 17 g packet Take 17 g by mouth daily. 14 each 0   Prenatal Vit-Fe Fumarate-FA (PRENATAL VITAMIN) 27-0.8 MG TABS Take 1 tablet by mouth daily. (Patient not taking: Reported on 06/06/2021) 30 tablet 12   Vitamin D, Ergocalciferol, (DRISDOL) 1.25 MG (50000 UNIT) CAPS capsule Take 50,000 Units by mouth every Wednesday.     warfarin (COUMADIN) 10 MG tablet Take 1 tablet by mouth once daily on Wednesdays and Fridays 30 tablet 1   warfarin (COUMADIN) 7.5 MG tablet Take 1 tablet (7.5 mg total) by mouth one time only at 4 PM. 30 tablet 0   No current facility-administered medications on file prior to visit.    ALLERGIES: No Known Allergies  FAMILY HISTORY: Family History  Problem Relation Age of Onset   Asthma Father    Obesity Neg Hx    Hypertension Neg Hx    Diabetes Neg Hx    Cancer Neg Hx       Objective:  Blood pressure 94/66, pulse 68, height 5\' 4"  (1.626 m), weight 146  lb 9.6 oz (66.5 kg), SpO2 100 %, unknown if currently breastfeeding. General: No acute distress.  Patient appears well-groomed.   Head:  Normocephalic/atraumatic Eyes:  Fundi examined but not visualized Neck: supple, no paraspinal tenderness, full range of motion Heart:  Regular rate and rhythm Lungs:  Clear to auscultation bilaterally Back: No paraspinal tenderness Neurological Exam: alert and oriented to person, place, and time.  Speech fluent and not dysarthric, language intact.  CN II-XII intact. Bulk and tone normal, muscle strength 5/5 throughout.  Sensation to light touch intact.  Deep tendon reflexes 2+ throughout, toes downgoing.  Finger to nose testing intact.  Gait normal, Romberg negative.   Shon Millet, DO  CC: Fleet Contras, MD

## 2021-06-09 ENCOUNTER — Ambulatory Visit (INDEPENDENT_AMBULATORY_CARE_PROVIDER_SITE_OTHER): Payer: 59 | Admitting: Neurology

## 2021-06-09 ENCOUNTER — Encounter: Payer: Self-pay | Admitting: Neurology

## 2021-06-09 ENCOUNTER — Other Ambulatory Visit: Payer: Self-pay

## 2021-06-09 VITALS — BP 94/66 | HR 68 | Ht 64.0 in | Wt 146.6 lb

## 2021-06-09 DIAGNOSIS — G932 Benign intracranial hypertension: Secondary | ICD-10-CM | POA: Diagnosis not present

## 2021-06-09 DIAGNOSIS — Z86718 Personal history of other venous thrombosis and embolism: Secondary | ICD-10-CM | POA: Diagnosis not present

## 2021-06-09 NOTE — Patient Instructions (Signed)
When you start your new insurance in January, contact us with the insurance information and we will send a referral to ophthlamology.  Follow up 7-8 months

## 2021-06-12 ENCOUNTER — Other Ambulatory Visit: Payer: Self-pay | Admitting: Nurse Practitioner

## 2021-06-12 ENCOUNTER — Ambulatory Visit
Admission: RE | Admit: 2021-06-12 | Discharge: 2021-06-12 | Disposition: A | Discharge status: Home / Self Care | Payer: 59 | Source: Ambulatory Visit | Attending: Nurse Practitioner | Admitting: Nurse Practitioner

## 2021-06-12 DIAGNOSIS — R2231 Localized swelling, mass and lump, right upper limb: Secondary | ICD-10-CM

## 2021-06-14 ENCOUNTER — Inpatient Hospital Stay: Payer: 59

## 2021-06-14 ENCOUNTER — Other Ambulatory Visit: Payer: Self-pay

## 2021-06-14 DIAGNOSIS — L732 Hidradenitis suppurativa: Secondary | ICD-10-CM | POA: Diagnosis not present

## 2021-06-14 DIAGNOSIS — I82220 Acute embolism and thrombosis of inferior vena cava: Secondary | ICD-10-CM

## 2021-06-14 LAB — PROTIME-INR
INR: 2.1 — ABNORMAL HIGH (ref 0.8–1.2)
Prothrombin Time: 23.4 seconds — ABNORMAL HIGH (ref 11.4–15.2)

## 2021-06-20 NOTE — Progress Notes (Signed)
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  She underwent hypercoagulopathy work-up which was negative except low protein S activity (22), lupus anticoagulant and antiphospholipid syndrome antibodies were negative.  She was discharged home with Lovenox injection, but she was noncompliant and stopped on her own.  She does not remember how long she was on it. She was admitted on January 23, 2021 for near occlusive right side PE with pulmonary infarction, IVC thrombosis, Doppler was negative for lower extremity DVT.  Patient was treated with Lovenox injection and transition to Coumadin. Not sure about compliance with coumadin.  Patient was readmitted on March 22, 2021 for epigastric and back pain for 2 weeks, CT scan showed extensive thrombosis involving infrarenal IVC, bilateral common iliac, right external iliac.  She was started on heparin drip, underwent tPA thrombolysis for 24 hours, then mechanical thrombectomy by vascular surgery on March 24, 2021.  She was transitioned to Xarelto. Per pt she has been compliant with Xarelto, no missing dose before current admission.  Readmitted on/30/22 for worsening left leg and epigastric pain.  CTA showed persistent but improved DVT involving the right common iliac, right external iliac and the bilateral internal iliac vein.  Doppler showed acute DVT in bilateral lower extremities.  Xarelto was stopped, treated with heparin drip in the hospital and transition to Coumadin  with goal INR 2.5-3.5.  Discharged 10/8 on Lovenox bridge that was d/c'd after reaching therapeutic INR on coumadin  CURRENT THERAPY: Coumadin 7.5 mg daily except 10 mg Wednesdays and Fridays, due to her therapeutic level 05/11/2021, changed to 7.5 mg daily and 10 mg on Wednesdays only  INTERVAL HISTORY: Ms. Schoenwald returns for follow up as scheduled. Last seen by me a month ago. She continues coumadin with weekly INR, last level was subtherapeutic, likely due to concurrent doxycycline. No missed doses.  She had mammo/US that showed hydradenitis and was given doxycycline 1 mg twice daily for 1 week, she has 1 day left.  Nodule is improving.  She is otherwise doing well.  She notes her period lasted longer and was heavier this week, but "not bad."  Denies other bleeding or bruising.  Denies recent cough, chest pain, dyspnea, headache, or vision change.  She was recently seen by neurology, recommended to have eye exam.   MEDICAL HISTORY:  Past Medical History:  Diagnosis Date   Stroke [facility])     SURGICAL HISTORY: Past Surgical History:  Procedure Laterality Date   BIOPSY  04/05/2021   Procedure: BIOPSY;  Surgeon: Daryel November, MD;  Location: [facility] ENDOSCOPY;  Service: Gastroenterology;;   COLONOSCOPY N/A 04/05/2021   Procedure: COLONOSCOPY;  Surgeon: Daryel November, MD;  Location: [facility] ENDOSCOPY;  Service: Gastroenterology;  Laterality: N/A;   ESOPHAGOGASTRODUODENOSCOPY N/A 04/05/2021   Procedure: ESOPHAGOGASTRODUODENOSCOPY (EGD);  Surgeon: Daryel November, MD;  Location: [place];  Service: Gastroenterology;  Laterality: N/A;   NO PAST SURGERIES     PERIPHERAL VASCULAR BALLOON ANGIOPLASTY  03/24/2021   Procedure: PERIPHERAL VASCULAR BALLOON ANGIOPLASTY;  Surgeon: Cherre Robins, MD;  Location: [facility]  INVASIVE CV LAB;  Service: Cardiovascular;;  bilateral common iliacs   PERIPHERAL VASCULAR THROMBECTOMY N/A 03/23/2021   Procedure: PERIPHERAL VASCULAR THROMBECTOMY;  Surgeon: Maeola Harman, MD;  Location: [facility] INVASIVE CV LAB;  Service: Cardiovascular;  Laterality: N/A;   PERIPHERAL VASCULAR THROMBECTOMY N/A 03/24/2021   Procedure: LYSIS RECHECK;  Surgeon: Leonie Douglas, MD;  Location: [facility] INVASIVE CV LAB;  Service: Cardiovascular;  Laterality: N/A;    I have reviewed the social history and family history with the patient and they are unchanged from previous note.  ALLERGIES:  has No Known Allergies.  MEDICATIONS:  Current Outpatient Medications  Medication Sig Dispense Refill   enoxaparin (LOVENOX) 100 MG/ML injection Inject 1 mL (100 mg total) into the skin daily. 7 mL 0   acetaZOLAMIDE (DIAMOX) 125 MG tablet Take 2 tablets (250 mg total) by mouth 2 (two) times daily. (Patient not taking: Reported on 06/06/2021) 120 tablet 5   acetaZOLAMIDE (DIAMOX) 250 MG tablet Take 500 mg by mouth 2 (two) times daily. (Patient not taking: Reported on 06/09/2021)     benzonatate (TESSALON) 100 MG capsule Take 1 capsule (100 mg total) by mouth 3 (three) times daily. (Patient not taking: Reported on 06/06/2021) 20 capsule 0   clotrimazole (CLOTRIMAZOLE 3) 2 % vaginal cream Place 1 Applicatorful vaginally at bedtime. 21 g 0   docusate sodium (COLACE) 100 MG capsule Take 1 capsule (100 mg total) by mouth at bedtime. 10 capsule 0   ferrous fumarate-b12-vitamic C-folic acid (TRINSICON / FOLTRIN) capsule Take 1 capsule by mouth 2 (two) times daily after a meal. 180 capsule 0   HYDROcodone-acetaminophen (NORCO/VICODIN) 5-325 MG tablet Take 1-2 tablets by mouth every 4 (four) hours as needed.     metoprolol succinate (TOPROL-XL) 25 MG 24 hr tablet Take 1 tablet (25 mg total) by mouth daily. 30 tablet 1   polyethylene glycol (MIRALAX / GLYCOLAX) 17 g packet Take 17 g by mouth daily. 14 each 0   Prenatal Vit-Fe Fumarate-FA (PRENATAL VITAMIN) 27-0.8 MG TABS Take 1 tablet by mouth daily. 30 tablet 12   Vitamin D, Ergocalciferol, (DRISDOL) 1.25 MG (50000 UNIT) CAPS capsule Take 50,000 Units by  mouth every Wednesday.     warfarin (COUMADIN) 10 MG tablet Take 1 tablet by mouth once daily on Wednesdays and Fridays 30 tablet 1   warfarin (COUMADIN) 7.5 MG tablet Take 1 tablet (7.5 mg total) by mouth one time only at 4 PM. 30 tablet 0   No current facility-administered medications for this visit.    PHYSICAL EXAMINATION: Vitals:   06/21/21 0943  BP: 107/67  Pulse: 60  Resp: 18  Temp: 97.9 F (36.6 C)  SpO2: 100%   Filed Weights   06/21/21 0943  Weight: 148 lb (67.1 kg)    GENERAL:alert, no distress and comfortable SKIN: No rash EYES: sclera clear LUNGS: clear with normal breathing effort HEART: regular rate & rhythm, no lower extremity edema NEURO: alert & oriented x 3 with fluent speech, no focal motor deficits Right axillary nodularity appears smaller, open wound with scant drainage   LABORATORY DATA:  I have reviewed the data as listed CBC Latest Ref Rng & Units 06/21/2021 06/07/2021 05/17/2021  WBC 4.0 - 10.5 K/uL 4.6 4.8 5.1  Hemoglobin 12.0 - 15.0 g/dL 16.1 09.6 04.5  Hematocrit 36.0 - 46.0 % 38.5 39.1 37.9  Platelets 150 - 400 K/uL 184 204 191     CMP Latest Ref Rng & Units 04/08/2021 04/05/2021 04/03/2021  Glucose 70 -  99 mg/dL 95 - 025(K)  BUN 6 - 20 mg/dL 5(L) - 6  Creatinine 2.70 - 1.00 mg/dL 6.23 - 7.62  Sodium 831 - 145 mmol/L 135 - 133(L)  Potassium 3.5 - 5.1 mmol/L 3.9 - 3.8  Chloride 98 - 111 mmol/L 106 - 102  CO2 22 - 32 mmol/L 18(L) - 22  Calcium 8.9 - 10.3 mg/dL 9.4 - 8.8(L)  Total Protein 6.5 - 8.1 g/dL - 7.6 7.4  Total Bilirubin 0.3 - 1.2 mg/dL - 5.1(V) 0.5  Alkaline Phos 38 - 126 U/L - 305(H) 359(H)  AST 15 - 41 U/L - 37 175(H)  ALT 0 - 44 U/L - 79(H) 145(H)      RADIOGRAPHIC STUDIES: I have personally reviewed the radiological images as listed and agreed with the findings in the report. No results found.   ASSESSMENT & PLAN: 30 year old female   1.  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  -Most recently she developed acute lower extremity DVT on Xarelto, concerning for Xarelto failure.  Xarelto was stopped and she was treated with heparin in the hospital, and transition to Coumadin - discharged home on Coumadin 7.5 mg daily and Lovenox bridge, goal INR 2.5-3.5 -she reached therapeutic INR x2 on coumadin and lovenox was discontinued. She is on 7.5 mg daily except 10 mg Wednesday and Friday.  INR on 05/11/2021 was 3.6, she was changed to 7.5 mg daily except 10 mg on Wednesdays -Ms. Charo appears stable.  No signs of recurrent thrombosis.  She had a heavy menstrual period this month likely due to Coumadin and increased anticoagulation effects with doxycycline.  She is otherwise doing well. -INR today 1.6, subtherapeutic likely due to doxycycline usage. She will stop that now.  -given her most recent imaging showed persistent DVTs in iliac veins, will restart lovenox 1.5 mg/kg daily -she will receive lovenox inj x1 in clinic and continue once daily until therapeutic. She will continue coumadin 7.5 mg daily except 10 mg on wednesdays -repeat INR 12/23 for close management    2.  Acute on chronic hydradenitis -She developed painful lump in right axilla recently and a small open sore. she recently stopped nursing in 04/2021 during hospitalization. -She underwent bilateral mammogram which showed breast density category C, no evidence of malignancy, and a  superficial collection in the right axilla consistent with acute on chronic hydradenitis -Symptoms improving after 1 week on doxycycline. She will discontinue now due to subtherapeutic INR today  3. Vaginal itching -symptoms are external, not UTI-like sx, likely Candida infection -due to DDI  between diflucan and coumadin, I prescribed topical clotrimazole and encouraged her to f/up with PCP  -Not discussed today, likely resolved  PLAN: -INR subtherapeutic, 1.6 likely due to taking doxy for infection. Stop abx now -Lovenox 100 mg inj x1 in clinic, then once daily until therapeutic starting 12/22. Reviewed with pharmacy -Continue coumaidn 7.5 mg daily except 10 mg on wednesdays -Frequent INR until therapeutic, next on 12/23  -Once therapeutic, lab weekly x6 -F/up  in 6 weeks   All questions were answered. The patient knows to call the clinic with any problems, questions or concerns. No barriers to learning was detected. I spent 20 minutes counseling the patient face to face. The total time spent in the appointment was 30 minutes and more than 50% was on counseling and review of test results     [name], NP 06/21/21

## 2021-06-21 ENCOUNTER — Inpatient Hospital Stay (HOSPITAL_BASED_OUTPATIENT_CLINIC_OR_DEPARTMENT_OTHER): Payer: 59 | Admitting: Nurse Practitioner

## 2021-06-21 ENCOUNTER — Other Ambulatory Visit: Payer: Self-pay

## 2021-06-21 ENCOUNTER — Inpatient Hospital Stay: Payer: 59

## 2021-06-21 ENCOUNTER — Encounter: Payer: Self-pay | Admitting: Nurse Practitioner

## 2021-06-21 VITALS — BP 107/67 | HR 60 | Temp 97.9°F | Resp 18 | Ht 64.0 in | Wt 148.0 lb

## 2021-06-21 DIAGNOSIS — L732 Hidradenitis suppurativa: Secondary | ICD-10-CM | POA: Diagnosis not present

## 2021-06-21 DIAGNOSIS — I82220 Acute embolism and thrombosis of inferior vena cava: Secondary | ICD-10-CM

## 2021-06-21 LAB — CBC WITH DIFFERENTIAL (CANCER CENTER ONLY)
Abs Immature Granulocytes: 0.01 10*3/uL (ref 0.00–0.07)
Basophils Absolute: 0 10*3/uL (ref 0.0–0.1)
Basophils Relative: 1 %
Eosinophils Absolute: 0.1 10*3/uL (ref 0.0–0.5)
Eosinophils Relative: 3 %
HCT: 38.5 % (ref 36.0–46.0)
Hemoglobin: 12.1 g/dL (ref 12.0–15.0)
Immature Granulocytes: 0 %
Lymphocytes Relative: 34 %
Lymphs Abs: 1.6 10*3/uL (ref 0.7–4.0)
MCH: 24.5 pg — ABNORMAL LOW (ref 26.0–34.0)
MCHC: 31.4 g/dL (ref 30.0–36.0)
MCV: 77.9 fL — ABNORMAL LOW (ref 80.0–100.0)
Monocytes Absolute: 0.4 10*3/uL (ref 0.1–1.0)
Monocytes Relative: 8 %
Neutro Abs: 2.5 10*3/uL (ref 1.7–7.7)
Neutrophils Relative %: 54 %
Platelet Count: 184 10*3/uL (ref 150–400)
RBC: 4.94 MIL/uL (ref 3.87–5.11)
RDW: 16.7 % — ABNORMAL HIGH (ref 11.5–15.5)
WBC Count: 4.6 10*3/uL (ref 4.0–10.5)
nRBC: 0 % (ref 0.0–0.2)

## 2021-06-21 LAB — PROTIME-INR
INR: 1.6 — ABNORMAL HIGH (ref 0.8–1.2)
Prothrombin Time: 19.4 seconds — ABNORMAL HIGH (ref 11.4–15.2)

## 2021-06-21 MED ORDER — ENOXAPARIN SODIUM 100 MG/ML IJ SOSY: 100.0000 mg | PREFILLED_SYRINGE | INTRAMUSCULAR | 0 refills | Status: DC

## 2021-06-21 MED ORDER — WARFARIN SODIUM 7.5 MG PO TABS: 7.5000 mg | ORAL_TABLET | Freq: Once | ORAL | 0 refills | Status: DC

## 2021-06-21 MED ORDER — ENOXAPARIN SODIUM 100 MG/ML IJ SOSY
100.0000 mg | PREFILLED_SYRINGE | Freq: Once | INTRAMUSCULAR | Status: AC
  Administered 2021-06-21: 12:00:00 100 mg via SUBCUTANEOUS
  Filled 2021-06-21: qty 1

## 2021-06-21 MED ORDER — WARFARIN SODIUM 10 MG PO TABS: ORAL_TABLET | ORAL | 1 refills | Status: DC

## 2021-06-23 ENCOUNTER — Other Ambulatory Visit: Payer: Self-pay

## 2021-06-23 ENCOUNTER — Inpatient Hospital Stay: Payer: 59

## 2021-06-23 DIAGNOSIS — L732 Hidradenitis suppurativa: Secondary | ICD-10-CM | POA: Diagnosis not present

## 2021-06-23 DIAGNOSIS — I82220 Acute embolism and thrombosis of inferior vena cava: Secondary | ICD-10-CM

## 2021-06-23 LAB — PROTIME-INR
INR: 1.9 — ABNORMAL HIGH (ref 0.8–1.2)
Prothrombin Time: 21.7 seconds — ABNORMAL HIGH (ref 11.4–15.2)

## 2021-06-27 ENCOUNTER — Inpatient Hospital Stay: Payer: 59

## 2021-06-27 ENCOUNTER — Other Ambulatory Visit: Payer: Self-pay

## 2021-06-27 DIAGNOSIS — L732 Hidradenitis suppurativa: Secondary | ICD-10-CM | POA: Diagnosis not present

## 2021-06-27 DIAGNOSIS — I82220 Acute embolism and thrombosis of inferior vena cava: Secondary | ICD-10-CM

## 2021-06-27 LAB — PROTIME-INR
INR: 2.3 — ABNORMAL HIGH (ref 0.8–1.2)
Prothrombin Time: 25.2 seconds — ABNORMAL HIGH (ref 11.4–15.2)

## 2021-06-28 ENCOUNTER — Other Ambulatory Visit: Payer: Self-pay

## 2021-06-28 ENCOUNTER — Inpatient Hospital Stay: Payer: 59

## 2021-06-28 MED ORDER — WARFARIN SODIUM 10 MG PO TABS: ORAL_TABLET | ORAL | 1 refills | Status: DC

## 2021-06-28 MED ORDER — ENOXAPARIN SODIUM 100 MG/ML IJ SOSY: 100.0000 mg | PREFILLED_SYRINGE | INTRAMUSCULAR | 0 refills | Status: DC

## 2021-06-28 MED ORDER — WARFARIN SODIUM 7.5 MG PO TABS: 7.5000 mg | ORAL_TABLET | Freq: Once | ORAL | 0 refills | Status: DC

## 2021-06-28 NOTE — Progress Notes (Signed)
Tried calling the pt on the referenced telephone call on file but pt did not answer nor does the phone allow to leave a voicemail message.  Called pt's friend which is listed in chart for emergency contacts to see if he could as pt to give Dr. Latanya Maudlin office a call.  Abdou pt's friend stated that the pt went to work today and he asked if the pt's labs were still out of therapeutic range.  Informed pt's friend that "Yes" and Lacie would like the pt to continue to take the Lovenox injections and take the coumadin.  Informed pt that Clayborn Heron would also like for the pt to return on Friday, 06/30/2021 for INR lab draw.  Mr. Laney Pastor stated he will take to the pt when the pt goes to lunch and make her aware of the Lacie Burton's request for pt to continue taking both anticoagulants and follow-up lab appt.  Sent refill on Lovenox and Warfarin prescriptions.

## 2021-06-30 ENCOUNTER — Inpatient Hospital Stay: Payer: 59

## 2021-06-30 ENCOUNTER — Telehealth: Payer: Self-pay

## 2021-06-30 NOTE — Telephone Encounter (Signed)
Spoke with pt via telephone regarding Anticoagulant therapy.  Informed pt that Dr. Mosetta Putt would like for the pt to take 10mg  of Coumadin on Wednesday and Sunday.  Pt is to 7.5mg  of Coumadin on other days.  Instructed pt that Dr. Sunday would like for her to stop taking Lovenox injections at this time.  Pt verbalized understanding of instructions.  Informed pt that she has a lab appt scheduled for 07/05/2021 @1200 .  Pt confirmed appt date and time.

## 2021-07-04 ENCOUNTER — Telehealth: Payer: Self-pay | Admitting: Nurse Practitioner

## 2021-07-04 ENCOUNTER — Inpatient Hospital Stay: Payer: Medicaid Other | Attending: Hematology

## 2021-07-04 ENCOUNTER — Other Ambulatory Visit: Payer: Self-pay

## 2021-07-04 DIAGNOSIS — I82220 Acute embolism and thrombosis of inferior vena cava: Secondary | ICD-10-CM

## 2021-07-04 DIAGNOSIS — Z86718 Personal history of other venous thrombosis and embolism: Secondary | ICD-10-CM | POA: Insufficient documentation

## 2021-07-04 DIAGNOSIS — Z7901 Long term (current) use of anticoagulants: Secondary | ICD-10-CM | POA: Diagnosis not present

## 2021-07-04 LAB — PROTIME-INR
INR: 1.9 — ABNORMAL HIGH (ref 0.8–1.2)
Prothrombin Time: 21.8 seconds — ABNORMAL HIGH (ref 11.4–15.2)

## 2021-07-04 NOTE — Telephone Encounter (Signed)
Today's INR remains subtherapeutic, lower. She stopped lovenox last week as instructed at INR 2.3, but continued coumadin 7.5 mg daily except wednesdays only instead of increasing to 10 mg Wed/Sun as instructed. She does not want to take lovenox anymore bc she is scared on injections.   I spoke to pt, she understands subINR increases her risk of recurrent blood clots. She agreed to take the lovenox shot once today after work.   I recommend to increase to 10 mg daily until 1/6 for next INR. She already took 7.5 today, so she will take 10 mg wed and Thursday, then hold for lab on Friday.   I strongly encouraged her to come to that lab appointment so we can check the level before the weekend. She understands and agrees with the plan.   Santiago Glad, NP

## 2021-07-04 NOTE — Patient Instructions (Signed)
Visit Information  Ms. Tarrant was given information about Medicaid Managed Care team care coordination services as a part of their [facility] Medicaid benefit. Clint Seman verbally consented to engagement with the [facility] Managed Care team.   If you are experiencing a medical emergency, please call 911 or report to your local emergency department or urgent care.   If you have a non-emergency medical problem during routine business hours, please contact your provider's office and ask to speak with a nurse.   For questions related to your [facility] health plan, please call: [phone] or go here:[email or web address]  If you would like to schedule transportation through your [facility] plan, please call the following number at least 2 days in advance of your appointment: [phone].  Call the [place] at [phone], at any time, 24 hours a day, 7 days a week. If you are in danger or need immediate medical attention call 911.  If you would like help to quit smoking, call 1-800-QUIT-NOW 949 410 4729) OR Espaol: 1-855-Djelo-Ya (7-867-672-0947) o para ms informacin haga clic aqu or Text READY to 200-400 to register via text  Andrea Hayes - following are the goals we discussed in your visit today:   Goals Addressed   None     Please see education materials related to today's visit provided by MyChart link.  Patient verbalized understanding of today's visit information and education and will review information via My Chart link.  The Managed Medicaid care management team will reach out to the patient again over the next 30 days.   Salvatore Marvel RN, BSN Community Care Coordinator [facility] Mobile: [phone]   Following is a copy of your plan of care:  Care Plan : [place] of Care  Updates made by Inge Rise, RN since 07/04/2021 12:00 AM     Problem: Chronic Disease Management  and Care Coordination Needs for Deep Vein Thrombosis/stroke management and HTN   Priority: High  Onset Date: 06/06/2021     Long-Range Goal: Development of Plan of Care for Management and Care Coordination Needs (DVT/stroke management and HTN   Start Date: 06/06/2021  Expected End Date: 10/04/2021  Recent Progress: On track  Priority: High  Note:   Current Barriers:  Knowledge Deficits related to plan of care for management of HTN and deep vein thrombosis, stroke  Care Coordination needs related to Limited education about deep vein thrombosis, anticoagulants, strokes* Chronic Disease Management support and education needs related to HTN and deep vein thrombosis, stroke.    Recommendations/Changes made from today's visit: Encouraged to follow up with local Department of Social Services (DSS) for completion of 2022 medicaid forms to continue services for 2023 for her and newborn.  Patient has not yet contacted DSS but plans to contact them in near future to complete medicaid forms for continued services for 2023.  Need to follow up on next visit.  RN CM Clinical Goal(s):  Patient will verbalize understanding of plan for management of HTN and recurrent Deep Vein Thrombosis (DVT), stroke as evidenced by good control of HTN and recurrent DVT verbalize basic understanding of  HTN and deep vein thrombosis, stroke disease process and self health management plan as evidenced by for deep vein thrombosis, stroke to include use of anticoagulants and diet and hypertension take all medications exactly as prescribed and will call provider for medication related questions as evidenced by medication compliance. attend all scheduled medical appointments: weekly labs for INR due to  daily coumadin as evidenced by attending all scheduled appointments demonstrate Improved and Ongoing adherence to prescribed treatment plan for HTN and deep vein thrombosis, stroke as evidenced by therapeutic INR values continue to work  with RN Care Manager to address care management and care coordination needs related to  HTN and recurrent DVT, stroke prevention as evidenced by adherence to CM Team Scheduled appointments through collaboration with RN Care manager, provider, and care team.   Interventions: Follow up outreaches to review labs, review upcoming appointments, to educate and answer questions Inter-disciplinary care team collaboration (see longitudinal plan of care) Evaluation of current treatment plan related to  self management and patient's adherence to plan as established by provider Follow up with patient to assess for any worsening symptoms related to DVT or care coordination needs Reviewed recent labs, recent blood pressure readings and discussed importance of hydration and it's affect on blood pressure. No reported leg pain, swelling, no headaches Reviewed upcoming appointments with patient.  Deep vein thrombosis, stroke Goal on track:  Yes. Evaluation of current treatment plan related to  deep vein thrombosis, stroke , Limited education about home management, medicine, diet related to deep vein thrombosis, stroke* self-management and patient's adherence to plan as established by provider. Discussed plans with patient for ongoing care management follow up and provided patient with direct contact information for care management team Evaluation of current treatment plan related to deep vein thrombosis, stroke and patient's adherence to plan as established by provider; Provided education to patient re: DVT, Stroke, Vitamin K high, medium, low foods, anticoagulants, bleeding risks, INR values with therapeutic range of 2.5-3.5; Reviewed medications with patient and discussed action plan, worsening symptoms to call MD office for any changes;   Hypertension Interventions:  (Status:  Goal on track:  Yes.) Long Term Goal Last practice recorded BP readings:   BP Readings from Last 3 Encounters:  06/21/21 107/67   06/09/21 94/66  05/11/21 98/62     Most recent eGFR/CrCl: No results found for: EGFR  No components found for: CRCL  Evaluation of current treatment plan related to hypertension self management and patient's adherence to plan as established by provider Reviewed medications with patient and discussed importance of compliance Discussed plans with patient for ongoing care management follow up and provided patient with direct contact information for care management team Reviewed scheduled/upcoming provider appointments including:   Educated patient on the importance of staying hydrated and it's direst affect on blood pressure.  Patient reports she has occasional light headed and dizziness at work when she does not drink fluids while at work.  Advised patient to increase her water intake when at work and to change positions slowly because of possible orthostatic  changes in blood pressure.  Patient verbalized understanding.  Patient Goals/Self-Care Activities: Take all medications as prescribed Attend all scheduled provider appointments Perform all self care activities independently  Perform IADL's (shopping, preparing meals, housekeeping, managing finances) independently Call provider office for new concerns or questions   Follow Up Plan:  The patient has been provided with contact information for the care management team and has been advised to call with any health related questions or concerns.

## 2021-07-04 NOTE — Patient Outreach (Signed)
Medicaid Managed Care   Nurse Care Manager Note  07/04/2021 Name:  Andrea Hayes MRN:  [id number] DOB:  [birth year]  Andrea Hayes is an 31 y.o. year old female who is a primary patient of Andrea Hayes.  The Medicaid Managed Care Coordination team was consulted for assistance with:    HTN DVT/Post Stroke  Andrea Hayes was given information about Medicaid Managed Care Coordination team services today. Andrea Hayes Patient agreed to services and verbal consent obtained.  Engaged with patient by telephone for follow up visit in response to provider referral for case management and/or care coordination services.   Assessments/Interventions:  Review of past medical history, allergies, medications, health status, including review of consultants reports, laboratory and other test data, was performed as part of comprehensive evaluation and provision of chronic care management services.  SDOH (Social Determinants of Health) assessments and interventions performed:   Care Plan  No Known Allergies  Medications Reviewed Today     Reviewed by Inge Rise, RN (Case Manager) on 07/04/21 at [phone]  Med List Status: <None>   Medication Order Taking? Sig Documenting Provider Last Dose Status Informant  acetaZOLAMIDE (DIAMOX) 125 MG tablet [id number] No Take 2 tablets (250 mg total) by mouth 2 (two) times daily.  Patient not taking: Reported on 06/06/2021   Andrea Partridge, DO Not Taking Active Self  acetaZOLAMIDE (DIAMOX) 250 MG tablet [id number] No Take 500 mg by mouth 2 (two) times daily.  Patient not taking: Reported on 06/09/2021   Provider, Historical, Hayes Not Taking Active   benzonatate (TESSALON) 100 MG capsule [id number] No Take 1 capsule (100 mg total) by mouth 3 (three) times daily.  Patient not taking: Reported on 06/06/2021   Andrea Hayes Not Taking Active   clotrimazole (CLOTRIMAZOLE 3) 2 % vaginal cream [id number] No Place 1 Applicatorful vaginally at bedtime. Andrea Hayes Taking Active   docusate sodium (COLACE) 100 MG capsule [id number] No Take 1 capsule (100 mg total) by mouth at bedtime. Andrea Hayes Taking Active   enoxaparin (LOVENOX) 100 MG/ML injection [id number]  Inject 1 mL (100 mg total) into the skin daily. Andrea Hayes  Active   ferrous [id number] C-folic acid (TRINSICON / FOLTRIN) capsule [id number] No Take 1 capsule by mouth 2 (two) times daily after a meal. Andrea Hayes Taking Active   HYDROcodone-acetaminophen (NORCO/VICODIN) 5-325 MG tablet [id number] No Take 1-2 tablets by mouth every 4 (four) hours as needed. Provider, Historical, Hayes Taking Active   metoprolol succinate (TOPROL-XL) 25 MG 24 hr tablet [id number] No Take 1 tablet (25 mg total) by mouth daily. Andrea Hayes Taking Active Self  polyethylene glycol (MIRALAX / GLYCOLAX) 17 g packet [id number] No Take 17 g by mouth daily. Andrea Hayes Taking Active   Prenatal Vit-Fe Fumarate-FA (PRENATAL VITAMIN) 27-0.8 MG TABS [id number] No Take 1 tablet by mouth daily. Andrea Hayes Taking Active   Vitamin D, Ergocalciferol, (DRISDOL) 1.25 MG (50000 UNIT) CAPS capsule [id number] No Take 50,000 Units by mouth every Wednesday. Provider, Historical, Hayes Taking Active Self  warfarin (COUMADIN) 10 MG tablet [id number]  Take 1 tablet by mouth once daily on Wednesdays and Fridays Andrea Hayes  Active   warfarin (COUMADIN) 7.5 MG tablet [id number]  Take 1 tablet (7.5 mg total) by mouth one time only at 4 PM. 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   Acute respiratory failure with hypoxia ([place]) 01/26/2021   Chronic right arterial ischemic stroke, MCA (middle cerebral artery) 01/23/2021   Pulmonary  embolism ([place]) 01/23/2021   Leukocytosis 01/23/2021   Thrombocytopenia ([place]) 01/23/2021   Elevated liver enzymes 01/23/2021   Chest pain 01/23/2021   Back pain 01/23/2021   Vaginal delivery 12/05/2020   Headache 11/28/2020   HTN (hypertension) 11/28/2020   Severe preeclampsia, delivered 11/04/2020   IUGR (intrauterine growth restriction) affecting care of mother 10/31/2020   Thrombocytopenia affecting pregnancy ([place]) 05/27/2020   Acute right arterial ischemic stroke, middle cerebral artery (MCA) ([facility])    Dural venous sinus thrombosis    Idiopathic intracranial hypertension    Cerebral venous thrombosis 07/24/2018    Conditions to be addressed/monitored per PCP order:  HTN and DVT/Post stroke  Care Plan : RN Care Manager Plan of Care  Updates made by Inge Rise, RN since 07/04/2021 12:00 AM     Problem: Chronic Disease Management and Care Coordination Needs for Deep Vein Thrombosis/stroke management and HTN   Priority: High  Onset Date: 06/06/2021     Long-Range Goal: Development of Plan of Care for Management and Care Coordination Needs (DVT/stroke management and HTN   Start Date: 06/06/2021  Expected End Date: 10/04/2021  Recent Progress: On track  Priority: High  Note:   Current Barriers:  Knowledge Deficits related to plan of care for management of HTN and deep vein thrombosis, stroke  Care Coordination needs related to Limited education about deep vein thrombosis, anticoagulants, strokes* Chronic Disease Management support and education needs related to HTN and deep vein thrombosis, stroke.    Recommendations/Changes made from today's visit: Encouraged to follow up with local Department of Social Services (DSS) for completion of 2022 medicaid forms to continue services for 2023 for her and newborn.  Patient has not yet contacted DSS but plans to contact them in near future to complete medicaid forms for continued services for 2023.  Need to follow up on next  visit.  RN CM Clinical Goal(s):  Patient will verbalize understanding of plan for management of HTN and recurrent Deep Vein Thrombosis (DVT), stroke as evidenced by good control of HTN and recurrent DVT verbalize basic understanding of  HTN and deep vein thrombosis, stroke disease process and self health management plan as evidenced by for deep vein thrombosis, stroke to include use of anticoagulants and diet and hypertension take all medications exactly as prescribed and will call provider for medication related questions as evidenced by medication compliance. attend all scheduled medical appointments: weekly labs for INR due to daily coumadin as evidenced by attending all scheduled appointments demonstrate Improved and Ongoing adherence to prescribed treatment plan for HTN and deep vein thrombosis, stroke as evidenced by therapeutic INR values continue to work with RN Care Manager to address care management and care coordination needs related to  HTN and recurrent DVT, stroke prevention as evidenced by adherence to CM Team Scheduled appointments through collaboration with RN Care manager, provider, and care team.   Interventions: Follow up outreaches to review labs, review upcoming appointments, to educate and answer questions Inter-disciplinary care team collaboration (see longitudinal plan of care) Evaluation of current treatment plan related to  self management  and patient's adherence to plan as established by provider Follow up with patient to assess for any worsening symptoms related to DVT or care coordination needs Reviewed recent labs, recent blood pressure readings and discussed importance of hydration and it's affect on blood pressure. No reported leg pain, swelling, no headaches Reviewed upcoming appointments with patient.  Deep vein thrombosis, stroke Goal on track:  Yes. Evaluation of current treatment plan related to  deep vein thrombosis, stroke , Limited education about home  management, medicine, diet related to deep vein thrombosis, stroke* self-management and patient's adherence to plan as established by provider. Discussed plans with patient for ongoing care management follow up and provided patient with direct contact information for care management team Evaluation of current treatment plan related to deep vein thrombosis, stroke and patient's adherence to plan as established by provider; Provided education to patient re: DVT, Stroke, Vitamin K high, medium, low foods, anticoagulants, bleeding risks, INR values with therapeutic range of 2.5-3.5; Reviewed medications with patient and discussed action plan, worsening symptoms to call Hayes office for any changes;   Hypertension Interventions:  (Status:  Goal on track:  Yes.) Long Term Goal Last practice recorded BP readings:   BP Readings from Last 3 Encounters:  06/21/21 107/67  06/09/21 94/66  05/11/21 98/62     Most recent eGFR/CrCl: No results found for: EGFR  No components found for: CRCL  Evaluation of current treatment plan related to hypertension self management and patient's adherence to plan as established by provider Reviewed medications with patient and discussed importance of compliance Discussed plans with patient for ongoing care management follow up and provided patient with direct contact information for care management team Reviewed scheduled/upcoming provider appointments including:   Educated patient on the importance of staying hydrated and it's direst affect on blood pressure.  Patient reports she has occasional light headed and dizziness at work when she does not drink fluids while at work.  Advised patient to increase her water intake when at work and to change positions slowly because of possible orthostatic  changes in blood pressure.  Patient verbalized understanding.  Patient Goals/Self-Care Activities: Take all medications as prescribed Attend all scheduled provider  appointments Perform all self care activities independently  Perform IADL's (shopping, preparing meals, housekeeping, managing finances) independently Call provider office for new concerns or questions   Follow Up Plan:  The patient has been provided with contact information for the care management team and has been advised to call with any health related questions or concerns.        Follow Up:  Patient agrees to Care Plan and Follow-up.  Plan: The Managed Medicaid care management team will reach out to the patient again over the next 30 days.  Date/time of next scheduled RN care management/care coordination outreach:  08/03/2021 at 9:00 AM  [place], [place] [facility] Mobile: [phone]

## 2021-07-05 ENCOUNTER — Inpatient Hospital Stay: Payer: Medicaid Other

## 2021-07-07 ENCOUNTER — Other Ambulatory Visit: Payer: Self-pay

## 2021-07-07 ENCOUNTER — Inpatient Hospital Stay: Payer: Medicaid Other

## 2021-07-07 DIAGNOSIS — I82220 Acute embolism and thrombosis of inferior vena cava: Secondary | ICD-10-CM

## 2021-07-07 DIAGNOSIS — Z86718 Personal history of other venous thrombosis and embolism: Secondary | ICD-10-CM | POA: Diagnosis not present

## 2021-07-07 LAB — PROTIME-INR
INR: 2.8 — ABNORMAL HIGH (ref 0.8–1.2)
Prothrombin Time: 29.4 seconds — ABNORMAL HIGH (ref 11.4–15.2)

## 2021-07-12 ENCOUNTER — Telehealth: Payer: Self-pay

## 2021-07-12 ENCOUNTER — Other Ambulatory Visit: Payer: Self-pay

## 2021-07-12 ENCOUNTER — Inpatient Hospital Stay: Payer: Medicaid Other

## 2021-07-12 DIAGNOSIS — I82220 Acute embolism and thrombosis of inferior vena cava: Secondary | ICD-10-CM

## 2021-07-12 DIAGNOSIS — Z86718 Personal history of other venous thrombosis and embolism: Secondary | ICD-10-CM | POA: Diagnosis not present

## 2021-07-12 LAB — PROTIME-INR
INR: 1.4 — ABNORMAL HIGH (ref 0.8–1.2)
Prothrombin Time: 17 seconds — ABNORMAL HIGH (ref 11.4–15.2)

## 2021-07-12 NOTE — Telephone Encounter (Signed)
This pt did not answer, left a voicemail instructing her to give Dr.Fengs office a callback regarding her updated therapy plan with coumadin intake.

## 2021-07-12 NOTE — Telephone Encounter (Addendum)
Pt was called and she stated she was taking coumadin 10mg  on wednesdays and fridays only. Pt is instructed per Dr.Feng to take 10mg  of coumadin on wednesdays and sundays, then 7.5mg  on the other days. Pt verbalized understanding.

## 2021-07-12 NOTE — Telephone Encounter (Signed)
-----   Message from [name], NP sent at 07/12/2021  2:40 PM EST ----- Her INR has been very difficult for me to keep therapeutic. And she does not like to bridge with lovenox when INR is low. I recommend to go to 10 mg daily and recheck INR on Monday (goal INR 2.5 - 3.5).   I appreciate Dr. Ernestina Penna input if you have time to review.   Thanks, Regan Rakers

## 2021-07-12 NOTE — Telephone Encounter (Signed)
-----   Message from [name], NP sent at 07/12/2021  1:50 PM EST ----- Please call patient to see how she is taking coumadin since last week 1/6 and let me know, level is low again.  Thanks, Regan Rakers

## 2021-07-13 ENCOUNTER — Telehealth: Payer: Self-pay

## 2021-07-13 NOTE — Telephone Encounter (Signed)
Pt stated she is not taking the lovenox injections and this LPN instructed her to take 10mg  of coumadin daily til next lab appt on 07/19/2021 per Dr.Feng. Pt verbalized understanding. Pt also stated she needs a refill of the coumadin 10mg  daily. Lacie Burton,NP was notified.

## 2021-07-13 NOTE — Telephone Encounter (Signed)
-----   Message from Lacie K Burton, NP sent at 07/12/2021  2:40 PM EST ----- °Her INR has been very difficult for me to keep therapeutic. And she does not like to bridge with lovenox when INR is low. I recommend to go to 10 mg daily and recheck INR on Monday (goal INR 2.5 - 3.5).  ° °I appreciate Dr. Feng's input if you have time to review.  ° °Thanks, °Lacie  ° °

## 2021-07-16 ENCOUNTER — Other Ambulatory Visit: Payer: Self-pay | Admitting: Nurse Practitioner

## 2021-07-16 MED ORDER — WARFARIN SODIUM 10 MG PO TABS: ORAL_TABLET | ORAL | 1 refills | Status: DC

## 2021-07-19 ENCOUNTER — Inpatient Hospital Stay: Payer: Medicaid Other

## 2021-07-20 ENCOUNTER — Other Ambulatory Visit: Payer: Self-pay

## 2021-07-20 ENCOUNTER — Telehealth: Payer: Self-pay | Admitting: Nurse Practitioner

## 2021-07-20 ENCOUNTER — Inpatient Hospital Stay: Payer: Medicaid Other

## 2021-07-20 DIAGNOSIS — Z86718 Personal history of other venous thrombosis and embolism: Secondary | ICD-10-CM | POA: Diagnosis not present

## 2021-07-20 DIAGNOSIS — I82220 Acute embolism and thrombosis of inferior vena cava: Secondary | ICD-10-CM

## 2021-07-20 LAB — CBC WITH DIFFERENTIAL (CANCER CENTER ONLY)
Abs Immature Granulocytes: 0 10*3/uL (ref 0.00–0.07)
Basophils Absolute: 0 10*3/uL (ref 0.0–0.1)
Basophils Relative: 1 %
Eosinophils Absolute: 0.3 10*3/uL (ref 0.0–0.5)
Eosinophils Relative: 7 %
HCT: 37.5 % (ref 36.0–46.0)
Hemoglobin: 11.3 g/dL — ABNORMAL LOW (ref 12.0–15.0)
Immature Granulocytes: 0 %
Lymphocytes Relative: 28 %
Lymphs Abs: 1.3 10*3/uL (ref 0.7–4.0)
MCH: 24.1 pg — ABNORMAL LOW (ref 26.0–34.0)
MCHC: 30.1 g/dL (ref 30.0–36.0)
MCV: 80 fL (ref 80.0–100.0)
Monocytes Absolute: 0.4 10*3/uL (ref 0.1–1.0)
Monocytes Relative: 9 %
Neutro Abs: 2.6 10*3/uL (ref 1.7–7.7)
Neutrophils Relative %: 55 %
Platelet Count: 150 10*3/uL (ref 150–400)
RBC: 4.69 MIL/uL (ref 3.87–5.11)
RDW: 17.7 % — ABNORMAL HIGH (ref 11.5–15.5)
WBC Count: 4.7 10*3/uL (ref 4.0–10.5)
nRBC: 0 % (ref 0.0–0.2)

## 2021-07-20 LAB — PROTIME-INR
INR: 1.8 — ABNORMAL HIGH (ref 0.8–1.2)
Prothrombin Time: 20.7 seconds — ABNORMAL HIGH (ref 11.4–15.2)

## 2021-07-20 NOTE — Telephone Encounter (Signed)
I called patient to review INR, last week 1/11 subtherapeutic 1.4, she started taking 10 mg po once daily. Today INR 1.8. denies taking new meds. Denies diet changes, bleeding, or other issues. I recommend 15 mg po today, then back to 10 mg daily. Next lab 07/26/21.   Santiago Glad, NP

## 2021-07-26 ENCOUNTER — Other Ambulatory Visit: Payer: Self-pay

## 2021-07-26 ENCOUNTER — Other Ambulatory Visit: Payer: 59

## 2021-07-26 ENCOUNTER — Inpatient Hospital Stay: Payer: Medicaid Other

## 2021-07-26 ENCOUNTER — Ambulatory Visit: Payer: Self-pay

## 2021-07-26 DIAGNOSIS — Z86718 Personal history of other venous thrombosis and embolism: Secondary | ICD-10-CM | POA: Diagnosis not present

## 2021-07-26 DIAGNOSIS — I82220 Acute embolism and thrombosis of inferior vena cava: Secondary | ICD-10-CM

## 2021-07-26 LAB — PROTIME-INR
INR: 2.7 — ABNORMAL HIGH (ref 0.8–1.2)
Prothrombin Time: 28.9 seconds — ABNORMAL HIGH (ref 11.4–15.2)

## 2021-08-02 ENCOUNTER — Inpatient Hospital Stay: Payer: Medicaid Other | Attending: Nurse Practitioner | Admitting: Nurse Practitioner

## 2021-08-02 ENCOUNTER — Ambulatory Visit (HOSPITAL_COMMUNITY)
Admission: RE | Admit: 2021-08-02 | Discharge: 2021-08-02 | Disposition: A | Discharge status: Home / Self Care | Payer: Medicaid Other | Source: Ambulatory Visit | Attending: Nurse Practitioner | Admitting: Nurse Practitioner

## 2021-08-02 ENCOUNTER — Encounter: Payer: Self-pay | Admitting: Nurse Practitioner

## 2021-08-02 ENCOUNTER — Other Ambulatory Visit: Payer: Self-pay

## 2021-08-02 ENCOUNTER — Inpatient Hospital Stay: Payer: Medicaid Other

## 2021-08-02 VITALS — BP 103/64 | HR 63 | Temp 97.7°F | Resp 18 | Wt 143.1 lb

## 2021-08-02 DIAGNOSIS — I82220 Acute embolism and thrombosis of inferior vena cava: Secondary | ICD-10-CM | POA: Diagnosis present

## 2021-08-02 DIAGNOSIS — R0789 Other chest pain: Secondary | ICD-10-CM | POA: Diagnosis present

## 2021-08-02 LAB — PROTIME-INR
INR: 2.5 — ABNORMAL HIGH (ref 0.8–1.2)
Prothrombin Time: 27 seconds — ABNORMAL HIGH (ref 11.4–15.2)

## 2021-08-02 LAB — POCT I-STAT CREATININE: Creatinine, Ser: 0.6 mg/dL (ref 0.44–1.00)

## 2021-08-02 MED ORDER — SODIUM CHLORIDE (PF) 0.9 % IJ SOLN
INTRAMUSCULAR | Status: AC
  Filled 2021-08-02: qty 50

## 2021-08-02 MED ORDER — IOHEXOL 350 MG/ML SOLN
80.0000 mL | Freq: Once | INTRAVENOUS | Status: AC | PRN
  Administered 2021-08-02: 80 mL via INTRAVENOUS

## 2021-08-02 NOTE — Progress Notes (Addendum)
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  She underwent hypercoagulopathy work-up which was negative except low protein S activity (22), lupus anticoagulant and antiphospholipid syndrome antibodies were negative.  She was discharged home with Lovenox injection, but she was noncompliant and stopped on her own.  She does not remember how long she was on it. She was admitted on January 23, 2021 for near occlusive right side PE with pulmonary infarction, IVC thrombosis, Doppler was negative for lower extremity DVT.  Patient was treated with Lovenox injection and transition to Coumadin. Not sure about compliance with coumadin.  Patient was readmitted on March 22, 2021 for epigastric and back pain for 2 weeks, CT scan showed extensive thrombosis involving infrarenal IVC, bilateral common iliac, right external iliac.  She was started on heparin drip, underwent tPA thrombolysis for 24 hours, then mechanical thrombectomy by vascular surgery on March 24, 2021.  She was transitioned to Xarelto. Per pt she has been compliant with Xarelto, no missing dose before current admission.  Readmitted on/30/22 for worsening left leg and epigastric pain.  CTA showed persistent but improved DVT involving the right common iliac, right external iliac and the bilateral internal iliac vein.  Doppler showed acute DVT in bilateral lower extremities.  Xarelto was stopped, treated with heparin drip in the hospital and transition to Coumadin with goal INR  2.5-3.5.  Discharged 10/8 on Lovenox bridge that was d/c'd after reaching therapeutic INR on coumadin   CURRENT THERAPY: Coumadin 7.5 mg daily except 10 mg Wednesdays and Fridays, due to her therapeutic level 05/11/2021, changed to 7.5 mg daily and 10 mg on Wednesdays only on 06/21/21. Became subtherapeutic after taking doxy for skin infection now on coumadin 10 mg daily.  Goal INR 2.5 - 3.5   INTERVAL HISTORY: Andrea Hayes returns for follow up as scheduled. She is doing well.  Compliant with Coumadin 10 mg p.o. once daily, denies missed or double dosing.  She has not started any new meds or changed her diet.  Her menstrual periods are becoming lighter, denies other bleeding.  She reports 2 days of "heavy" chest pain in the upper sternum. Mainly occurs with activity, not present when she is sleeping.  She attributes this to the way she holds her baby in the carrier/harness. She did not have pain like this with her previous DVT/PE's. Denies pain on inspiration, back pain, dizziness, cough, dyspnea, wheezing, fever, chills.    MEDICAL HISTORY:  Past Medical History:  Diagnosis Date   Stroke [facility])     SURGICAL HISTORY: Past Surgical History:  Procedure Laterality Date   BIOPSY  04/05/2021   Procedure: BIOPSY;  Surgeon: Daryel November, MD;  Location: [facility] ENDOSCOPY;  Service: Gastroenterology;;   COLONOSCOPY N/A 04/05/2021   Procedure: COLONOSCOPY;  Surgeon: Daryel November, MD;  Location: [facility] ENDOSCOPY;  Service: Gastroenterology;  Laterality: N/A;   ESOPHAGOGASTRODUODENOSCOPY N/A 04/05/2021   Procedure: ESOPHAGOGASTRODUODENOSCOPY (EGD);  Surgeon: Daryel November, MD;  Location: [place];  Service: Gastroenterology;  Laterality: N/A;   NO PAST SURGERIES     PERIPHERAL  VASCULAR BALLOON ANGIOPLASTY  03/24/2021   Procedure: PERIPHERAL VASCULAR BALLOON ANGIOPLASTY;  Surgeon: Cherre Robins, MD;  Location: [place] CV LAB;  Service: Cardiovascular;;  bilateral common iliacs    PERIPHERAL VASCULAR THROMBECTOMY N/A 03/23/2021   Procedure: PERIPHERAL VASCULAR THROMBECTOMY;  Surgeon: Waynetta Sandy, MD;  Location: [place] CV LAB;  Service: Cardiovascular;  Laterality: N/A;   PERIPHERAL VASCULAR THROMBECTOMY N/A 03/24/2021   Procedure: LYSIS RECHECK;  Surgeon: Cherre Robins, MD;  Location: [place] CV LAB;  Service: Cardiovascular;  Laterality: N/A;    I have reviewed the social history and family history with the patient and they are unchanged from previous note.  ALLERGIES:  has No Known Allergies.  MEDICATIONS:  Current Outpatient Medications  Medication Sig Dispense Refill   acetaZOLAMIDE (DIAMOX) 125 MG tablet Take 2 tablets (250 mg total) by mouth 2 (two) times daily. (Patient not taking: Reported on 06/06/2021) 120 tablet 5   acetaZOLAMIDE (DIAMOX) 250 MG tablet Take 500 mg by mouth 2 (two) times daily. (Patient not taking: Reported on 06/09/2021)     benzonatate (TESSALON) 100 MG capsule Take 1 capsule (100 mg total) by mouth 3 (three) times daily. (Patient not taking: Reported on 06/06/2021) 20 capsule 0   clotrimazole (CLOTRIMAZOLE 3) 2 % vaginal cream Place 1 Applicatorful vaginally at bedtime. 21 g 0   docusate sodium (COLACE) 100 MG capsule Take 1 capsule (100 mg total) by mouth at bedtime. 10 capsule 0   enoxaparin (LOVENOX) 100 MG/ML injection Inject 1 mL (100 mg total) into the skin daily. 7 mL 0   ferrous [id number] C-folic acid (TRINSICON / FOLTRIN) capsule Take 1 capsule by mouth 2 (two) times daily after a meal. 180 capsule 0   HYDROcodone-acetaminophen (NORCO/VICODIN) 5-325 MG tablet Take 1-2 tablets by mouth every 4 (four) hours as needed.     metoprolol succinate (TOPROL-XL) 25 MG 24 hr tablet Take 1 tablet (25 mg total) by mouth daily. 30 tablet 1   polyethylene glycol (MIRALAX / GLYCOLAX) 17 g packet Take 17 g by mouth daily. 14 each 0   Prenatal Vit-Fe Fumarate-FA (PRENATAL VITAMIN) 27-0.8 MG TABS Take 1 tablet by mouth  daily. 30 tablet 12   Vitamin D, Ergocalciferol, (DRISDOL) 1.25 MG (50000 UNIT) CAPS capsule Take 50,000 Units by mouth every Wednesday.     warfarin (COUMADIN) 10 MG tablet Take 1 tablet by mouth as directed by NP/MD 30 tablet 1   warfarin (COUMADIN) 7.5 MG tablet Take 1 tablet (7.5 mg total) by mouth one time only at 4 PM. 30 tablet 0   No current facility-administered medications for this visit.   Facility-Administered Medications Ordered in Other Visits  Medication Dose Route Frequency Provider Last Rate Last Admin   sodium chloride (PF) 0.9 % injection             PHYSICAL EXAMINATION:  Vitals:   08/02/21 0855  BP: 103/64  Pulse: 63  Resp: 18  Temp: 97.7 F (36.5 C)  SpO2: 100%   Filed Weights   08/02/21 0855  Weight: 143 lb 2 oz (64.9 kg)    GENERAL:alert, no distress and comfortable SKIN: No rash, petechiae, or ecchymoses EYES: sclera clear LUNGS: clear with normal breathing effort, no wheezing HEART: regular rate & rhythm, no lower extremity edema Musculoskeletal: No focal sternal tenderness NEURO: alert & oriented x 3 with fluent speech, no focal motor/sensory deficits  LABORATORY DATA:  I have reviewed the data as listed CBC Latest Ref Rng & Units  07/20/2021 06/21/2021 06/07/2021  WBC 4.0 - 10.5 K/uL 4.7 4.6 4.8  Hemoglobin 12.0 - 15.0 g/dL 11.3(L) 12.1 12.3  Hematocrit 36.0 - 46.0 % 37.5 38.5 39.1  Platelets 150 - 400 K/uL 150 184 204     CMP Latest Ref Rng & Units 08/02/2021 04/08/2021 04/05/2021  Glucose 70 - 99 mg/dL - 95 -  BUN 6 - 20 mg/dL - 5(L) -  Creatinine 0.44 - 1.00 mg/dL 0.60 0.58 -  Sodium 135 - 145 mmol/L - 135 -  Potassium 3.5 - 5.1 mmol/L - 3.9 -  Chloride 98 - 111 mmol/L - 106 -  CO2 22 - 32 mmol/L - 18(L) -  Calcium 8.9 - 10.3 mg/dL - 9.4 -  Total Protein 6.5 - 8.1 g/dL - - 7.6  Total Bilirubin 0.3 - 1.2 mg/dL - - 0.2(L)  Alkaline Phos 38 - 126 U/L - - 305(H)  AST 15 - 41 U/L - - 37  ALT 0 - 44 U/L - - 79(H)      RADIOGRAPHIC  STUDIES: I have personally reviewed the radiological images as listed and agreed with the findings in the report. CT Angio Chest Pulmonary Embolism (PE) W or WO Contrast  Result Date: 08/02/2021 CLINICAL DATA:  PE suspected, shortness of breath and chest pain for 2 days history of multiple PE EXAM: CT ANGIOGRAPHY CHEST WITH CONTRAST TECHNIQUE: Multidetector CT imaging of the chest was performed using the standard protocol during bolus administration of intravenous contrast. Multiplanar CT image reconstructions and MIPs were obtained to evaluate the vascular anatomy. RADIATION DOSE REDUCTION: This exam was performed according to the departmental dose-optimization program which includes automated exposure control, adjustment of the mA and/or kV according to patient size and/or use of iterative reconstruction technique. CONTRAST:  40mL OMNIPAQUE IOHEXOL 350 MG/ML SOLN COMPARISON:  03/26/2021 FINDINGS: Cardiovascular: Satisfactory opacification of the pulmonary arteries to the segmental level. No evidence of pulmonary embolism. Mild cardiomegaly. No pericardial effusion. 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 Mediastinum/Nodes: No enlarged mediastinal, hilar, or axillary lymph nodes. Thyroid gland, trachea, and esophagus demonstrate no significant findings. Lungs/Pleura: Mild, diffuse bilateral bronchial wall thickening. Mild scarring of the bilateral lung bases no pleural effusion or pneumothorax. Upper Abdomen: No acute abnormality. Musculoskeletal: No chest wall abnormality. No acute osseous findings. Review of the MIP images confirms the above findings. IMPRESSION: 1. Negative examination for pulmonary embolism. Previously identified embolus as resolved. 2. Mild, diffuse bilateral bronchial wall thickening, consistent with nonspecific infectious or inflammatory bronchitis. 3. Mild cardiomegaly. Electronically Signed   By: Delanna Ahmadi M.D.   On: 08/02/2021 13:24      ASSESSMENT & PLAN: 31 year old female   Sternal chest pain -Onset 07/31/20, pain is "heavy" -She attributes to position of her baby carrier/harness  -pain not reproducible on exam -given her history, obtained EKG in clinic which shows sinus brady -she had sternal chest pain and became hypoxic on 6 minute walk test in clinic O2 97 - 80%, recovered to 100% with rest -due to her h/o and high concern for recurrent thrombosis, we obtained stat CTA that was negative for PE, and showed previous PE had resolved. It did show mild diffuse bilateral bronchial wall thickening consistent with nonspecific infectious/inflammatory bronchitis -She was able to ambulate to radiology and back to cancer center without respiratory complaints, she appears stable again on reexamination.  She denies clinical symptoms of bronchitis -I recommend to treat the sternal pain symptomatically with Tylenol, heat, and muscle rest. -She can  follow-up with PCP -She knows to call if symptoms worsen or fail to improve, or she develops new signs of thrombosis/PE  2.  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  -Most recently she developed acute lower extremity DVT on Xarelto, concerning for Xarelto failure.  Xarelto was stopped and she was treated with heparin in the hospital, and transition to Coumadin - discharged home on Coumadin 7.5 mg daily and Lovenox  bridge, goal INR 2.5-3.5 -she reached therapeutic INR x2 on coumadin and lovenox was discontinued. She is on 7.5 mg daily except 10 mg Wednesday and Friday.  INR on 05/11/2021 was 3.6, she was changed to 7.5 mg daily except 10 mg on Wednesdays -INR became subtherapeutic on doxycycline for skin infection, has been difficult to reach therapeutic window again. She has required lovenox briefly but does not like to do shots  -currently on coumadin 10 mg po once daily. INR 2.5 today in goal. Continue same dose. Tolerating well.  -continue weekly INR for now, f/up in 2 months    3.  Acute on chronic hydradenitis -She developed painful lump in right axilla recently and a small open sore. she recently stopped nursing in 04/2021 during hospitalization. -She underwent bilateral mammogram which showed breast density category C, no evidence of malignancy, and a superficial collection in the right axilla consistent with acute on chronic hydradenitis -Symptoms improved after 1 week on doxycycline. Stopped 06/21/21 due to subtherapeutic INR -Resolved   4. Vaginal itching -symptoms are external, not UTI-like sx, likely Candida infection -due to DDI  between diflucan and coumadin, I prescribed topical clotrimazole and encouraged her to f/up with PCP  -Resolved  Plan: -Lab, CTA reviewed -negative for recurrent PE -Treat sternal pain symptomatically (Tylenol, heat, muscle rest), follow-up with PCP -Continue Coumadin 10 mg daily, INR therapeutic today at 2.5 (goal 2.5-3.5) -INR weekly x8 -Follow-up in 8 weeks -Call clinic with worsening chest pain or new signs of recurrent DVT/PE -Patient seen with Dr. Burr Medico   Orders Placed This Encounter  Procedures   CT Angio Chest Pulmonary Embolism (PE) W or WO Contrast    Standing Status:   Future    Number of Occurrences:   1    Standing Expiration Date:   08/02/2022    Order Specific Question:   If indicated for the ordered procedure, I authorize the administration  of contrast media per Radiology protocol    Answer:   Yes    Order Specific Question:   Is patient pregnant?    Answer:   No    Order Specific Question:   Preferred imaging location?    Answer:   [facility]    Order Specific Question:   Call Results- Best Contact Number?    Answer:   [phone].......Marland Kitchenpatient can leave   EKG 12-Lead    Chest pain, h/o DVT/PE   EKG 12-Lead    Ordered by an unspecified provider    EKG 12-Lead    Ordered by an unspecified provider    All  questions were answered. The patient knows to call the clinic with any problems, questions or concerns. No barriers to learning were detected.     [name], NP 08/02/21   ADDENDUM  I have seen the patient, examined her. I agree with the assessment and and plan and have edited the notes.   Ms Camporeale complains chest pain for a few days, and she was found to be hypoxic on 6 minutes walking.  We proceeded with stat chest CTA, which was negative for PE, mild diffuse bilateral bronchial wall thickening.  No pneumonia or other significant findings.  She has no clinical signs of bronchitis, I agree with no antibiotics for now. She will monitor her oxygen level at home, and follow-up with her PCP.  Continue current dose Coumadin with frequent lab monitoring.  Truitt Merle  08/02/2021

## 2021-08-02 NOTE — Progress Notes (Signed)
This nurse performed an oxygen saturation test on this patient.  This patient ambulated on room air for six full minutes.  Saturations were at 97% when ambulation began and steadily dropped as the walking continued.  At the completion of 6 minutes oxygen saturations were down to 80% on room air.  This nurse did not observe any labored breathing or use of accessory muscles to assist with breathing.  This nurse had patient to sit and rest for a few minutes and took her oxygen saturations again and they had returned to 100% on room air.  Heart rate remained between 75 and 79 bpm the entire time. However, patient did complain of chest pain during ambulation but subsided after resting.  Provider made aware of observations.

## 2021-08-03 ENCOUNTER — Other Ambulatory Visit: Payer: Self-pay

## 2021-08-03 ENCOUNTER — Ambulatory Visit: Payer: Self-pay

## 2021-08-03 ENCOUNTER — Telehealth: Payer: Self-pay | Admitting: Nurse Practitioner

## 2021-08-03 NOTE — Telephone Encounter (Signed)
Unable to leave message with follow-up appointments per 2/1 los. Mailed calendar.

## 2021-08-03 NOTE — Patient Outreach (Signed)
Medicaid Managed Care   Nurse Care Manager Note  08/03/2021 Name:  Andrea Hayes MRN:  [id number] DOB:  March 17, 1991  Andrea Hayes is an 31 y.o. year old female who is a primary patient of Andrea Ebbs, MD.  The Medicaid Managed Care Coordination team was consulted for assistance with:    HTN DVT  [name] was given information about Medicaid Managed Care Coordination team services today. Chauntae Kirschenbaum Patient agreed to services and verbal consent obtained.  Engaged with patient by telephone for follow up visit in response to provider referral for case management and/or care coordination services.   Assessments/Interventions:  Review of past medical history, allergies, medications, health status, including review of consultants reports, laboratory and other test data, was performed as part of comprehensive evaluation and provision of chronic care management services.  SDOH (Social Determinants of Health) assessments and interventions performed:   Care Plan  No Known Allergies  Medications Reviewed Today     Reviewed by Inge Rise, RN (Case Manager) on 08/03/21 at [phone]  Med List Status: <None>   Medication Order Taking? Sig Documenting Provider Last Dose Status Informant  acetaZOLAMIDE (DIAMOX) 125 MG tablet [id number] No Take 2 tablets (250 mg total) by mouth 2 (two) times daily.  Patient not taking: Reported on 06/06/2021   [name], DO Not Taking Active Self  acetaZOLAMIDE (DIAMOX) 250 MG tablet [id number] No Take 500 mg by mouth 2 (two) times daily.  Patient not taking: Reported on 06/09/2021   [provider] Not Taking Active   benzonatate (TESSALON) 100 MG capsule [id number] No Take 1 capsule (100 mg total) by mouth 3 (three) times daily.  Patient not taking: Reported on 06/06/2021   Andrea Hamman, MD Not Taking Active   clotrimazole (CLOTRIMAZOLE 3) 2 % vaginal cream [id number] No Place 1 Applicatorful vaginally at bedtime. Andrea Feeling, NP Taking  Active   docusate sodium (COLACE) 100 MG capsule [id number] No Take 1 capsule (100 mg total) by mouth at bedtime. Andrea Hamman, MD Taking Active   ferrous [id number] C-folic acid (TRINSICON / FOLTRIN) capsule [id number] No Take 1 capsule by mouth 2 (two) times daily after a meal. Andrea Feeling, NP Taking Active   HYDROcodone-acetaminophen (NORCO/VICODIN) 5-325 MG tablet [id number] No Take 1-2 tablets by mouth every 4 (four) hours as needed. [provider] Taking Active   metoprolol succinate (TOPROL-XL) 25 MG 24 hr tablet [id number] No Take 1 tablet (25 mg total) by mouth daily. Andrea Nimrod, MD Taking Active Self  polyethylene glycol (MIRALAX / GLYCOLAX) 17 g packet [id number] No Take 17 g by mouth daily. Andrea Hamman, MD Taking Active   Prenatal Vit-Fe Fumarate-FA (PRENATAL VITAMIN) 27-0.8 MG TABS [id number] No Take 1 tablet by mouth daily. Andrea Hayes, CNM Taking Active   Vitamin D, Ergocalciferol, (DRISDOL) 1.25 MG (50000 UNIT) CAPS capsule [id number] No Take 50,000 Units by mouth every Wednesday. [provider] Taking Active Self  warfarin (COUMADIN) 10 MG tablet [id number]  Take 1 tablet by mouth as directed by NP/MD Andrea Feeling, NP  Active   warfarin (COUMADIN) 7.5 MG tablet [id number]  Take 1 tablet (7.5 mg total) by mouth one time only at 4 PM. 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   Acute respiratory failure with hypoxia (HCC) 01/26/2021   Chronic right arterial ischemic stroke, MCA (middle cerebral artery) 01/23/2021   Pulmonary embolism ([place]) 01/23/2021   Leukocytosis 01/23/2021   Thrombocytopenia ([place]) 01/23/2021   Elevated liver enzymes 01/23/2021   Chest pain 01/23/2021   Back  pain 01/23/2021   Vaginal delivery 12/05/2020   Headache 11/28/2020   HTN (hypertension) 11/28/2020   Severe preeclampsia, delivered 11/04/2020   IUGR (intrauterine growth restriction) affecting care of mother 10/31/2020   Thrombocytopenia affecting pregnancy ([place]) 05/27/2020   Acute right arterial ischemic stroke, middle cerebral artery (MCA) (HCC)    Dural venous sinus thrombosis    Idiopathic intracranial hypertension    Cerebral venous thrombosis 07/24/2018    Conditions to be addressed/monitored per PCP order:  HTN and DVT  Care Plan : RN Care Manager Plan of Care  Updates made by Inge Rise, RN since 08/03/2021 12:00 AM     Problem: Chronic Disease Management and Care Coordination Needs for Deep Vein Thrombosis/stroke management and HTN   Priority: High  Onset Date: 06/06/2021     Long-Range Goal: Development of Plan of Care for Management and Care Coordination Needs (DVT/stroke management and HTN   Start Date: 06/06/2021  Expected End Date: 10/04/2021  Recent Progress: On track  Priority: High  Note:   Current Barriers:  Knowledge Deficits related to plan of care for management of HTN and deep vein thrombosis, stroke  Care Coordination needs related to Limited education about deep vein thrombosis, anticoagulants, strokes* Chronic Disease Management support and education needs related to HTN and deep vein thrombosis, stroke.    Recommendations/Changes made from today's visit: Encouraged to follow up with local Department of Social Services (DSS) for completion of 2022 medicaid forms to continue services for 2023 for her and newborn.  Patient reports she contacted DSS to complete medicaid forms for continued services for 2023. Patient states she was told her Medicaid coverage is active.  RN CM Clinical Goal(s):  Patient will verbalize understanding of plan for management of HTN and recurrent Deep Vein Thrombosis (DVT), stroke as evidenced by good control of HTN and  recurrent DVT verbalize basic understanding of  HTN and deep vein thrombosis, stroke disease process and self health management plan as evidenced by for deep vein thrombosis, stroke to include use of anticoagulants and diet and hypertension take all medications exactly as prescribed and will call provider for medication related questions as evidenced by medication compliance. attend all scheduled medical appointments: weekly labs for INR due to daily coumadin as evidenced by attending all scheduled appointments demonstrate Improved and Ongoing adherence to prescribed treatment plan for HTN and deep vein thrombosis, stroke as evidenced by therapeutic INR values continue to work with RN Care Manager to address care management and care coordination needs related to  HTN and recurrent DVT, stroke prevention as evidenced by adherence to CM Team Scheduled appointments through collaboration with RN Care manager, provider, and care team.   Interventions: Follow up outreaches to review labs, review upcoming appointments, to educate and answer questions Inter-disciplinary care team collaboration (see longitudinal plan of care) Evaluation of current treatment plan related to  self management and patient's adherence to plan as established by provider Follow up with patient to assess for any worsening symptoms related to DVT or care coordination needs Reviewed recent labs, recent blood pressure readings and discussed importance of hydration  and it's affect on blood pressure. No reported leg pain, swelling, no headaches Reviewed upcoming appointments with patient.  Deep vein thrombosis, stroke Goal on track:  Yes. Evaluation of current treatment plan related to  deep vein thrombosis, stroke , Limited education about home management, medicine, diet related to deep vein thrombosis, stroke* self-management and patient's adherence to plan as established by provider. Discussed plans with patient for ongoing care  management follow up and provided patient with direct contact information for care management team Evaluation of current treatment plan related to deep vein thrombosis, stroke and patient's adherence to plan as established by provider Provided education to patient re: DVT, Stroke, anticoagulants, bleeding risks, INR values with therapeutic range of 2.5-3.5 Reviewed medications with patient and discussed action plan, worsening symptoms to call MD office for any changes Reviewed scheduled/upcoming provider appointments including Discussed plans with patient for ongoing care management follow up and provided patient with direct contact information for care management team Advised patient to discuss refilling pain medication (Norco) with provider for pain management of chest pain due to muscular strain.    Hypertension Interventions:  (Status:  Goal on track:  Yes.) Long Term Goal Last practice recorded BP readings:   BP Readings from Last 3 Encounters:  08/02/21 103/64  06/21/21 107/67  06/09/21 94/66     Most recent eGFR/CrCl: No results found for: EGFR  No components found for: CRCL  Evaluation of current treatment plan related to hypertension self management and patient's adherence to plan as established by provider Reviewed medications with patient and discussed importance of compliance Discussed plans with patient for ongoing care management follow up and provided patient with direct contact information for care management team Reviewed scheduled/upcoming provider appointments including:   Reinforced education with patient on the importance of staying hydrated and it's direst affect on blood pressure.  Patient denies any light headed or dizziness.   Patient verbalized understanding. Patient reports having chest pain x last 3 days.  Patient had office visit with Cira Rue NP yesterday for evaluation of this compliant of chest pain.  It was determined that pain most likely is muscular  pain due to how patient carries baby carrier. NP reccommended Tylenol, heat and muscle rest.  Patient plans to contact Cira Rue to ask for refill of Norco since this pain medication has been effective in pain management in the past.   Patient Goals/Self-Care Activities: Take all medications as prescribed Attend all scheduled provider appointments Perform all self care activities independently  Perform IADL's (shopping, preparing meals, housekeeping, managing finances) independently Call provider office for new concerns or questions   Follow Up Plan:  The patient has been provided with contact information for the care management team and has been advised to call with any health related questions or concerns.        Follow Up:  Patient agrees to Care Plan and Follow-up.  Plan: The Managed Medicaid care management team will reach out to the patient again over the next 30 days.  Date/time of next scheduled RN care management/care coordination outreach:  09/05/2021 at 9:00 AM  [place], [place] [facility] Mobile: [phone]

## 2021-08-03 NOTE — Patient Instructions (Signed)
Visit Information  Andrea Hayes was given information about Medicaid Managed Care team care coordination services as a part of their [facility] Medicaid benefit. Andrea Hayes verbally consented to engagement with the [facility] Managed Care team.   If you are experiencing a medical emergency, please call 911 or report to your local emergency department or urgent care.   If you have a non-emergency medical problem during routine business hours, please contact your provider's office and ask to speak with a nurse.   For questions related to your [facility] health plan, please call: [phone] or go here:[email or web address]  If you would like to schedule transportation through your [facility] plan, please call the following number at least 2 days in advance of your appointment: [phone].  Call the [place] [facility] at [phone], at any time, 24 hours a day, 7 days a week. If you are in danger or need immediate medical attention call 911.  If you would like help to quit smoking, call 1-800-QUIT-NOW (780)482-3116) OR Espaol: 1-855-Djelo-Ya (4-888-916-9450) o para ms informacin haga clic aqu or Text READY to 200-400 to register via text  Andrea Hayes - following are the goals we discussed in your visit today:   Goals Addressed: Please see Patient Goals in the [place] of Care below.  Please see education materials related to today's visit provided by MyChart link.  The patient verbalized understanding of today's instructions and visit information and will review via My Chart link.  The Managed Medicaid care management team will reach out to the patient again over the next 30 days.   Salvatore Marvel RN, BSN Community Care Coordinator [place] [facility] Mobile: [phone]   Following is a copy of your plan of care:  Care Plan : [place] of Care  Updates made by Inge Rise, RN since 08/03/2021  12:00 AM     Problem: Chronic Disease Management and Care Coordination Needs for Deep Vein Thrombosis/stroke management and HTN   Priority: High  Onset Date: 06/06/2021     Long-Range Goal: Development of Plan of Care for Management and Care Coordination Needs (DVT/stroke management and HTN   Start Date: 06/06/2021  Expected End Date: 10/04/2021  Recent Progress: On track  Priority: High  Note:   Current Barriers:  Knowledge Deficits related to plan of care for management of HTN and deep vein thrombosis, stroke  Care Coordination needs related to Limited education about deep vein thrombosis, anticoagulants, strokes* Chronic Disease Management support and education needs related to HTN and deep vein thrombosis, stroke.    Recommendations/Changes made from today's visit: Encouraged to follow up with local Department of Social Services (DSS) for completion of 2022 medicaid forms to continue services for 2023 for her and newborn.  Patient reports she contacted DSS to complete medicaid forms for continued services for 2023. Patient states she was told her Medicaid coverage is active.  RN CM Clinical Goal(s):  Patient will verbalize understanding of plan for management of HTN and recurrent Deep Vein Thrombosis (DVT), stroke as evidenced by good control of HTN and recurrent DVT verbalize basic understanding of  HTN and deep vein thrombosis, stroke disease process and self health management plan as evidenced by for deep vein thrombosis, stroke to include use of anticoagulants and diet and hypertension take all medications exactly as prescribed and will call provider for medication related questions as evidenced by medication compliance. attend all scheduled medical appointments: weekly labs for INR due to  daily coumadin as evidenced by attending all scheduled appointments demonstrate Improved and Ongoing adherence to prescribed treatment plan for HTN and deep vein thrombosis, stroke as evidenced by  therapeutic INR values continue to work with RN Care Manager to address care management and care coordination needs related to  HTN and recurrent DVT, stroke prevention as evidenced by adherence to CM Team Scheduled appointments through collaboration with RN Care manager, provider, and care team.   Interventions: Follow up outreaches to review labs, review upcoming appointments, to educate and answer questions Inter-disciplinary care team collaboration (see longitudinal plan of care) Evaluation of current treatment plan related to  self management and patient's adherence to plan as established by provider Follow up with patient to assess for any worsening symptoms related to DVT or care coordination needs Reviewed recent labs, recent blood pressure readings and discussed importance of hydration and it's affect on blood pressure. No reported leg pain, swelling, no headaches Reviewed upcoming appointments with patient.  Deep vein thrombosis, stroke Goal on track:  Yes. Evaluation of current treatment plan related to  deep vein thrombosis, stroke , Limited education about home management, medicine, diet related to deep vein thrombosis, stroke* self-management and patient's adherence to plan as established by provider. Discussed plans with patient for ongoing care management follow up and provided patient with direct contact information for care management team Evaluation of current treatment plan related to deep vein thrombosis, stroke and patient's adherence to plan as established by provider Provided education to patient re: DVT, Stroke, anticoagulants, bleeding risks, INR values with therapeutic range of 2.5-3.5 Reviewed medications with patient and discussed action plan, worsening symptoms to call MD office for any changes Reviewed scheduled/upcoming provider appointments including Discussed plans with patient for ongoing care management follow up and provided patient with direct contact  information for care management team Advised patient to discuss refilling pain medication (Norco) with provider for pain management of chest pain due to muscular strain.    Hypertension Interventions:  (Status:  Goal on track:  Yes.) Long Term Goal Last practice recorded BP readings:   BP Readings from Last 3 Encounters:  08/02/21 103/64  06/21/21 107/67  06/09/21 94/66     Most recent eGFR/CrCl: No results found for: EGFR  No components found for: CRCL  Evaluation of current treatment plan related to hypertension self management and patient's adherence to plan as established by provider Reviewed medications with patient and discussed importance of compliance Discussed plans with patient for ongoing care management follow up and provided patient with direct contact information for care management team Reviewed scheduled/upcoming provider appointments including:   Reinforced education with patient on the importance of staying hydrated and it's direst affect on blood pressure.  Patient denies any light headed or dizziness.   Patient verbalized understanding. Patient reports having chest pain x last 3 days.  Patient had office visit with Cira Rue NP yesterday for evaluation of this compliant of chest pain.  It was determined that pain most likely is muscular pain due to how patient carries baby carrier. NP reccommended Tylenol, heat and muscle rest.  Patient plans to contact Cira Rue to ask for refill of Norco since this pain medication has been effective in pain management in the past.   Patient Goals/Self-Care Activities: Take all medications as prescribed Attend all scheduled provider appointments Perform all self care activities independently  Perform IADL's (shopping, preparing meals, housekeeping, managing finances) independently Call provider office for new concerns or questions   Follow Up Plan:  The patient has been provided with contact information for the care management  team and has been advised to call with any health related questions or concerns.

## 2021-08-09 ENCOUNTER — Inpatient Hospital Stay: Payer: Medicaid Other

## 2021-08-09 ENCOUNTER — Other Ambulatory Visit: Payer: Self-pay

## 2021-08-09 DIAGNOSIS — L292 Pruritus vulvae: Secondary | ICD-10-CM | POA: Diagnosis not present

## 2021-08-09 DIAGNOSIS — R072 Precordial pain: Secondary | ICD-10-CM | POA: Diagnosis not present

## 2021-08-09 DIAGNOSIS — L732 Hidradenitis suppurativa: Secondary | ICD-10-CM | POA: Diagnosis not present

## 2021-08-09 DIAGNOSIS — Z86718 Personal history of other venous thrombosis and embolism: Secondary | ICD-10-CM | POA: Diagnosis present

## 2021-08-09 DIAGNOSIS — I82220 Acute embolism and thrombosis of inferior vena cava: Secondary | ICD-10-CM

## 2021-08-09 LAB — PROTIME-INR
INR: 2.9 — ABNORMAL HIGH (ref 0.8–1.2)
Prothrombin Time: 30 seconds — ABNORMAL HIGH (ref 11.4–15.2)

## 2021-08-09 NOTE — Progress Notes (Signed)
Patient prefers My Chart messages.  This nurse sent a message to this patient and made aware of lab results and recommendations mentioned below.  No further concerns at this time.     ~Please let pt know INR is in goal range, continue same dose 10 mg daily and return next week for lab.  Thanks,  Clayborn Heron

## 2021-08-10 ENCOUNTER — Other Ambulatory Visit: Payer: Self-pay

## 2021-08-10 ENCOUNTER — Telehealth: Payer: Self-pay

## 2021-08-10 NOTE — Telephone Encounter (Signed)
Pt called stating she is having abdominal pain when she bends down to lift things.  Pt would like to speak with Santiago Glad, NP for a prescription for pain medication.  Pt stated she's taking Tylenol but it's not helping with her pain.  Pt is requesting a prescription for Hydrocodone-acetaminophen (Norco/Vicodin).  Pt stated she has not spoken with her PCOP regarding her epigastric pain.  Notified Santiago Glad, NP of the pt's request.

## 2021-08-16 ENCOUNTER — Inpatient Hospital Stay: Payer: Medicaid Other

## 2021-08-23 ENCOUNTER — Inpatient Hospital Stay: Payer: Medicaid Other

## 2021-08-23 ENCOUNTER — Other Ambulatory Visit: Payer: Self-pay

## 2021-08-23 DIAGNOSIS — Z86718 Personal history of other venous thrombosis and embolism: Secondary | ICD-10-CM | POA: Diagnosis not present

## 2021-08-23 DIAGNOSIS — I82220 Acute embolism and thrombosis of inferior vena cava: Secondary | ICD-10-CM

## 2021-08-23 LAB — CBC WITH DIFFERENTIAL (CANCER CENTER ONLY)
Abs Immature Granulocytes: 0.02 10*3/uL (ref 0.00–0.07)
Basophils Absolute: 0 10*3/uL (ref 0.0–0.1)
Basophils Relative: 1 %
Eosinophils Absolute: 0.2 10*3/uL (ref 0.0–0.5)
Eosinophils Relative: 3 %
HCT: 37.6 % (ref 36.0–46.0)
Hemoglobin: 11.6 g/dL — ABNORMAL LOW (ref 12.0–15.0)
Immature Granulocytes: 0 %
Lymphocytes Relative: 34 %
Lymphs Abs: 1.6 10*3/uL (ref 0.7–4.0)
MCH: 24.3 pg — ABNORMAL LOW (ref 26.0–34.0)
MCHC: 30.9 g/dL (ref 30.0–36.0)
MCV: 78.8 fL — ABNORMAL LOW (ref 80.0–100.0)
Monocytes Absolute: 0.5 10*3/uL (ref 0.1–1.0)
Monocytes Relative: 11 %
Neutro Abs: 2.5 10*3/uL (ref 1.7–7.7)
Neutrophils Relative %: 51 %
Platelet Count: 150 10*3/uL (ref 150–400)
RBC: 4.77 MIL/uL (ref 3.87–5.11)
RDW: 17.2 % — ABNORMAL HIGH (ref 11.5–15.5)
WBC Count: 4.9 10*3/uL (ref 4.0–10.5)
nRBC: 0 % (ref 0.0–0.2)

## 2021-08-23 LAB — PROTIME-INR
INR: 2.4 — ABNORMAL HIGH (ref 0.8–1.2)
Prothrombin Time: 26.2 seconds — ABNORMAL HIGH (ref 11.4–15.2)

## 2021-08-25 ENCOUNTER — Other Ambulatory Visit: Payer: Self-pay | Admitting: Nurse Practitioner

## 2021-08-30 ENCOUNTER — Inpatient Hospital Stay: Payer: 59 | Attending: Nurse Practitioner

## 2021-08-30 DIAGNOSIS — Z8673 Personal history of transient ischemic attack (TIA), and cerebral infarction without residual deficits: Secondary | ICD-10-CM | POA: Insufficient documentation

## 2021-08-30 DIAGNOSIS — R072 Precordial pain: Secondary | ICD-10-CM | POA: Insufficient documentation

## 2021-08-30 DIAGNOSIS — L732 Hidradenitis suppurativa: Secondary | ICD-10-CM | POA: Insufficient documentation

## 2021-08-30 DIAGNOSIS — Z7901 Long term (current) use of anticoagulants: Secondary | ICD-10-CM | POA: Insufficient documentation

## 2021-08-30 DIAGNOSIS — Z86718 Personal history of other venous thrombosis and embolism: Secondary | ICD-10-CM | POA: Insufficient documentation

## 2021-09-02 ENCOUNTER — Other Ambulatory Visit: Payer: Self-pay | Admitting: Nurse Practitioner

## 2021-09-04 ENCOUNTER — Telehealth: Payer: Self-pay

## 2021-09-04 NOTE — Telephone Encounter (Signed)
This nurse attempted to reach patient to verify the dose of Coumadin that patient is currently taking.  Unable to leave a message.  This nurse will also reach out to patient on My Chart.  No further concerns at this time.   ?

## 2021-09-05 ENCOUNTER — Other Ambulatory Visit: Payer: Self-pay

## 2021-09-05 NOTE — Patient Instructions (Signed)
Visit Information ? ?Ms. Mitro was given information about Medicaid Managed Care team care coordination services as a part of their [facility] Medicaid benefit. Nichola Maiers verbally consented to engagement with the [facility] Managed Care team.  ? ?If you are experiencing a medical emergency, please call 911 or report to your local emergency department or urgent care.  ? ?If you have a non-emergency medical problem during routine business hours, please contact your provider's office and ask to speak with a nurse.  ? ?For questions related to your [facility] health plan, please call: [phone] or go here:[email or web address] ? ?If you would like to schedule transportation through your [facility] plan, please call the following number at least 2 days in advance of your appointment: [phone]. ? You can also use the MTM portal or MTM mobile app to manage your rides. For the portal, please go to [email or web address]. ? ?Call the [place] at [phone], at any time, 24 hours a day, 7 days a week. If you are in danger or need immediate medical attention call 911. ? ?If you would like help to quit smoking, call 1-800-QUIT-NOW (801) 472-4485) OR Espa?ol: 1-855-D?jelo-Ya 564 471 2076) o para m?s informaci?n haga clic aqu? or Text READY to 200-400 to register via text ? ?Ms. Willette - following are the goals we discussed in your visit today: Please see Patient Goals in the [place] of Care below. ? ? ?Please see education materials related to today's visit provided by MyChart link. ? ?Patient verbalizes understanding of instructions and care plan provided today and agrees to view in [place]. Active MyChart status confirmed with patient.   ? ?The Managed Medicaid care management team will reach out to the patient again over the next 30 days.  ? ?Salvatore Marvel RN, BSN ?Community Care Coordinator ?[facility] [facility] ?Mobile: [phone]   ? ?Following is a copy of your plan of care:  ?Care Plan : [place] of Care  ?Updates made by Inge Rise, RN since 09/05/2021 12:00 AM  ?  ? ?Problem: Chronic Disease Management and Care Coordination Needs for Deep Vein Thrombosis/stroke management and HTN   ?Priority: High  ?Onset Date: 06/06/2021  ?  ? ?Long-Range Goal: Development of Plan of Care for Management and Care Coordination Needs (DVT/stroke management and HTN   ?Start Date: 06/06/2021  ?Expected End Date: 10/04/2021  ?Recent Progress: On track  ?Priority: High  ?Note:   ?Current Barriers:  ?Knowledge Deficits related to plan of care for management of HTN and deep vein thrombosis, stroke  ?Care Coordination needs related to Limited education about deep vein thrombosis, anticoagulants, strokes* ?Chronic Disease Management support and education needs related to HTN and deep vein thrombosis, stroke.  ? ?RN CM Clinical Goal(s):  ?Patient will verbalize understanding of plan for management of HTN and recurrent Deep Vein Thrombosis (DVT), stroke as evidenced by good control of HTN and recurrent DVT ?verbalize basic understanding of  HTN and deep vein thrombosis, stroke disease process and self health management plan as evidenced by for deep vein thrombosis, stroke to include use of anticoagulants and diet and hypertension ?take all medications exactly as prescribed and will call provider for medication related questions as evidenced by medication compliance. ?attend all scheduled medical appointments: weekly labs for INR due to daily coumadin as evidenced by attending all scheduled appointments ?demonstrate Improved and Ongoing adherence to prescribed treatment plan for HTN and deep vein thrombosis, stroke as evidenced by therapeutic INR values ?continue  to work with RN Care Manager to address care management and care coordination needs related to  HTN and recurrent DVT, stroke prevention as evidenced by adherence to CM Team Scheduled  appointments through collaboration with RN Care manager, provider, and care team.  ? ?Interventions: ?Follow up outreaches to review labs, review upcoming appointments, to educate and answer questions ?Inter-disciplinary care team collaboration (see longitudinal plan of care) ?Evaluation of current treatment plan related to  self management and patient's adherence to plan as established by provider ?Follow up with patient to assess for any worsening symptoms related to DVT or care coordination needs ?Reviewed recent labs, recent blood pressure readings and discussed importance of hydration and it's affect on blood pressure. ?No reported leg pain, swelling, no headaches ?Reviewed upcoming appointments with patient. ? ?Deep vein thrombosis, stroke Goal on track:  Yes. ?Evaluation of current treatment plan related to  deep vein thrombosis, stroke , Limited education about home management, medicine, diet related to deep vein thrombosis, stroke* self-management and patient's adherence to plan as established by provider. ?Discussed plans with patient for ongoing care management follow up and provided patient with direct contact information for care management team ?Evaluation of current treatment plan related to deep vein thrombosis, stroke and patient's adherence to plan as established by provider ?Provided education to patient re: DVT, Stroke, anticoagulants, bleeding risks, INR values with therapeutic range of 2.5-3.5 ?Reviewed medications with patient and discussed action plan, worsening symptoms to call MD office for any changes ?Reviewed scheduled/upcoming provider appointments including ?Discussed plans with patient for ongoing care management follow up and provided patient with direct contact information for care management team ?Patient continues to have weekly lab work drawn for INR.  Patient currently on Coumadin 10 mg daily and is in therapeutic range of 2.5 - 3.5 ? ? ?Hypertension Interventions:  (Status:   Goal on track:  Yes.) Long Term Goal ?Last practice recorded BP readings:  ?BP Readings from Last 3 Encounters:  ?08/02/21 103/64  ?06/21/21 107/67  ?06/09/21 94/66  ?   ? ?Most recent eGFR/CrCl: No results found for: EGFR  No components found for: CRCL ? ?Evaluation of current treatment plan related to hypertension self management and patient's adherence to plan as established by provider ?Reviewed medications with patient and discussed importance of compliance ?Discussed plans with patient for ongoing care management follow up and provided patient with direct contact information for care management team ?Reviewed scheduled/upcoming provider appointments including:  ? Reinforced education with patient on the importance of staying hydrated and it's direct affect on blood pressure.  Patient states she drinks "A Lot" of fluids throughout the day.  Patient denies any light headed or dizziness.   Patient verbalized understanding. ?Patient reports muscular chest pain has resolved.  No further complaints of any pain.  ? ? ?Patient Goals/Self-Care Activities: ?Take all medications as prescribed ?Attend all scheduled provider appointments ?Perform all self care activities independently  ?Perform IADL's (shopping, preparing meals, housekeeping, managing finances) independently ?Call provider office for new concerns or questions  ? ?Follow Up Plan:  The patient has been provided contact information via My Chart link for the care management team and has been advised to call with any health related questions or concerns.  ? ? ?  ?  ?

## 2021-09-05 NOTE — Patient Outreach (Signed)
Medicaid Managed Care   Nurse Care Manager Note  09/05/2021 Name:  Andrea Hayes MRN:  [id number] DOB:  11/20/1990  Andrea Hayes is an 31 y.o. year old female who is a primary patient of Fleet Contras, MD.  The [facility] Managed Care Coordination team was consulted for assistance with:    HTN Recurrent DVT  Andrea Hayes was given information about Medicaid Managed Care Coordination team services today. Andrea Hayes Patient agreed to services and verbal consent obtained.  Engaged with patient by telephone for follow up visit in response to provider referral for case management and/or care coordination services.   Assessments/Interventions:  Review of past medical history, allergies, medications, health status, including review of consultants reports, laboratory and other test data, was performed as part of comprehensive evaluation and provision of chronic care management services.  SDOH (Social Determinants of Health) assessments and interventions performed:   Care Plan  No Known Allergies  Medications Reviewed Today     Reviewed by Leane Call, RN (Case Manager) on 09/05/21 at 0919  Med List Status: <None>   Medication Order Taking? Sig Documenting Provider Last Dose Status Informant  acetaZOLAMIDE (DIAMOX) 125 MG tablet [id number] No Take 2 tablets (250 mg total) by mouth 2 (two) times daily.  Patient not taking: Reported on 06/06/2021   Drema Dallas, DO Not Taking Active Self  acetaZOLAMIDE (DIAMOX) 250 MG tablet [id number] No Take 500 mg by mouth 2 (two) times daily.  Patient not taking: Reported on 06/09/2021   [provider] Not Taking Active   benzonatate (TESSALON) 100 MG capsule [id number] No Take 1 capsule (100 mg total) by mouth 3 (three) times daily.  Patient not taking: Reported on 06/06/2021   Rolly Salter, MD Not Taking Active   clotrimazole (CLOTRIMAZOLE 3) 2 % vaginal cream [id number] No Place 1 Applicatorful vaginally at bedtime. Pollyann Samples,  NP Taking Active   docusate sodium (COLACE) 100 MG capsule [id number] No Take 1 capsule (100 mg total) by mouth at bedtime. Rolly Salter, MD Taking Active   ferrous fumarate-b12-vitamic C-folic acid (TRINSICON / FOLTRIN) capsule [id number] No Take 1 capsule by mouth 2 (two) times daily after a meal. Pollyann Samples, NP Taking Active   HYDROcodone-acetaminophen (NORCO/VICODIN) 5-325 MG tablet [id number] No Take 1-2 tablets by mouth every 4 (four) hours as needed. [provider] Taking Active   metoprolol succinate (TOPROL-XL) 25 MG 24 hr tablet [id number] No Take 1 tablet (25 mg total) by mouth daily. Arnetha Courser, MD Taking Active Self  polyethylene glycol (MIRALAX / GLYCOLAX) 17 g packet [id number] No Take 17 g by mouth daily. Rolly Salter, MD Taking Active   Prenatal Vit-Fe Fumarate-FA (PRENATAL VITAMIN) 27-0.8 MG TABS [id number] No Take 1 tablet by mouth daily. Aviva Signs, CNM Taking Active   Vitamin D, Ergocalciferol, (DRISDOL) 1.25 MG (50000 UNIT) CAPS capsule [id number] No Take 50,000 Units by mouth every Wednesday. [provider] Taking Active Self  warfarin (COUMADIN) 10 MG tablet [id number]  TAKE 1 TABLET BY MOUTH AS DIRECTED BY NP/MD Pollyann Samples, NP  Active   warfarin (COUMADIN) 7.5 MG tablet [id number]  TAKE 1 TABLET (7.5 MG TOTAL) BY MOUTH ONE TIME ONLY AT 4 PM. 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   Acute respiratory failure with hypoxia (HCC) 01/26/2021   Chronic right arterial ischemic stroke, MCA (middle cerebral artery) 01/23/2021   Pulmonary embolism (HCC) 01/23/2021   Leukocytosis 01/23/2021   Thrombocytopenia (HCC) 01/23/2021   Elevated liver enzymes 01/23/2021   Chest pain  01/23/2021   Back pain 01/23/2021   Vaginal delivery 12/05/2020   Headache 11/28/2020   HTN (hypertension) 11/28/2020   Severe preeclampsia, delivered 11/04/2020   IUGR (intrauterine growth restriction) affecting care of mother 10/31/2020   Thrombocytopenia affecting pregnancy (HCC) 05/27/2020   Acute right arterial ischemic stroke, middle cerebral artery (MCA) (HCC)    Dural venous sinus thrombosis    Idiopathic intracranial hypertension    Cerebral venous thrombosis 07/24/2018    Conditions to be addressed/monitored per PCP order:  HTN and Recurrent DVT  Care Plan : RN Care Manager Plan of Care  Updates made by Leane Call, RN since 09/05/2021 12:00 AM     Problem: Chronic Disease Management and Care Coordination Needs for Deep Vein Thrombosis/stroke management and HTN   Priority: High  Onset Date: 06/06/2021     Long-Range Goal: Development of Plan of Care for Management and Care Coordination Needs (DVT/stroke management and HTN   Start Date: 06/06/2021  Expected End Date: 10/04/2021  Recent Progress: On track  Priority: High  Note:   Current Barriers:  Knowledge Deficits related to plan of care for management of HTN and deep vein thrombosis, stroke  Care Coordination needs related to Limited education about deep vein thrombosis, anticoagulants, strokes* Chronic Disease Management support and education needs related to HTN and deep vein thrombosis, stroke.   RN CM Clinical Goal(s):  Patient will verbalize understanding of plan for management of HTN and recurrent Deep Vein Thrombosis (DVT), stroke as evidenced by good control of HTN and recurrent DVT verbalize basic understanding of  HTN and deep vein thrombosis, stroke disease process and self health management plan as evidenced by for deep vein thrombosis, stroke to include use of anticoagulants and diet and hypertension take all medications exactly as prescribed and will call provider for medication related questions as  evidenced by medication compliance. attend all scheduled medical appointments: weekly labs for INR due to daily coumadin as evidenced by attending all scheduled appointments demonstrate Improved and Ongoing adherence to prescribed treatment plan for HTN and deep vein thrombosis, stroke as evidenced by therapeutic INR values continue to work with RN Care Manager to address care management and care coordination needs related to  HTN and recurrent DVT, stroke prevention as evidenced by adherence to CM Team Scheduled appointments through collaboration with RN Care manager, provider, and care team.   Interventions: Follow up outreaches to review labs, review upcoming appointments, to educate and answer questions Inter-disciplinary care team collaboration (see longitudinal plan of care) Evaluation of current treatment plan related to  self management and patient's adherence to plan as established by provider Follow up with patient to assess for any worsening symptoms related to DVT or care coordination needs Reviewed recent labs, recent blood pressure readings and discussed importance of hydration and it's affect on blood pressure. No reported leg pain, swelling, no headaches Reviewed upcoming appointments with patient.  Deep vein thrombosis, stroke Goal on track:  Yes. Evaluation of current treatment plan related to  deep vein thrombosis, stroke , Limited education about home management, medicine, diet related to deep vein thrombosis, stroke*  self-management and patient's adherence to plan as established by provider. Discussed plans with patient for ongoing care management follow up and provided patient with direct contact information for care management team Evaluation of current treatment plan related to deep vein thrombosis, stroke and patient's adherence to plan as established by provider Provided education to patient re: DVT, Stroke, anticoagulants, bleeding risks, INR values with therapeutic range  of 2.5-3.5 Reviewed medications with patient and discussed action plan, worsening symptoms to call MD office for any changes Reviewed scheduled/upcoming provider appointments including Discussed plans with patient for ongoing care management follow up and provided patient with direct contact information for care management team Patient continues to have weekly lab work drawn for INR.  Patient currently on Coumadin 10 mg daily and is in therapeutic range of 2.5 - 3.5   Hypertension Interventions:  (Status:  Goal on track:  Yes.) Long Term Goal Last practice recorded BP readings:  BP Readings from Last 3 Encounters:  08/02/21 103/64  06/21/21 107/67  06/09/21 94/66      Most recent eGFR/CrCl: No results found for: EGFR  No components found for: CRCL  Evaluation of current treatment plan related to hypertension self management and patient's adherence to plan as established by provider Reviewed medications with patient and discussed importance of compliance Discussed plans with patient for ongoing care management follow up and provided patient with direct contact information for care management team Reviewed scheduled/upcoming provider appointments including:   Reinforced education with patient on the importance of staying hydrated and it's direct affect on blood pressure.  Patient states she drinks "A Lot" of fluids throughout the day.  Patient denies any light headed or dizziness.   Patient verbalized understanding. Patient reports muscular chest pain has resolved.  No further complaints of any pain.    Patient Goals/Self-Care Activities: Take all medications as prescribed Attend all scheduled provider appointments Perform all self care activities independently  Perform IADL's (shopping, preparing meals, housekeeping, managing finances) independently Call provider office for new concerns or questions   Follow Up Plan:  The patient has been provided contact information via My Chart link  for the care management team and has been advised to call with any health related questions or concerns.        Follow Up:  Patient agrees to Care Plan and Follow-up.  Plan: The Managed Medicaid care management team will reach out to the patient again over the next 30 days.  Date/time of next scheduled RN care management/care coordination outreach:  10/03/2021 at 9:00 am  Virgina Norfolk RN, BSN [facility] Coordinator [facility]  [facility] Mobile: [phone]

## 2021-09-06 ENCOUNTER — Inpatient Hospital Stay: Payer: 59

## 2021-09-06 ENCOUNTER — Other Ambulatory Visit: Payer: Self-pay

## 2021-09-06 ENCOUNTER — Other Ambulatory Visit: Payer: Self-pay | Admitting: Nurse Practitioner

## 2021-09-06 ENCOUNTER — Telehealth: Payer: Self-pay

## 2021-09-06 DIAGNOSIS — L732 Hidradenitis suppurativa: Secondary | ICD-10-CM | POA: Diagnosis not present

## 2021-09-06 DIAGNOSIS — Z7901 Long term (current) use of anticoagulants: Secondary | ICD-10-CM | POA: Diagnosis not present

## 2021-09-06 DIAGNOSIS — Z86718 Personal history of other venous thrombosis and embolism: Secondary | ICD-10-CM | POA: Diagnosis not present

## 2021-09-06 DIAGNOSIS — R072 Precordial pain: Secondary | ICD-10-CM | POA: Diagnosis not present

## 2021-09-06 DIAGNOSIS — Z8673 Personal history of transient ischemic attack (TIA), and cerebral infarction without residual deficits: Secondary | ICD-10-CM | POA: Diagnosis not present

## 2021-09-06 DIAGNOSIS — I82220 Acute embolism and thrombosis of inferior vena cava: Secondary | ICD-10-CM

## 2021-09-06 LAB — PROTIME-INR
INR: 2.4 — ABNORMAL HIGH (ref 0.8–1.2)
Prothrombin Time: 26.2 seconds — ABNORMAL HIGH (ref 11.4–15.2)

## 2021-09-06 NOTE — Telephone Encounter (Signed)
Spoke with pt via telephone regarding her Coumadin.  Informed pt that her INR is low again x2 wks.  Pt confirmed that she's taking her Coumadin 10 mg daily and have not missed any of her doses.  Pt stated she's had no change in her diet or medications.  Instructed pt to take 15 mg (1.5 tabs) today only and then take 10 mg daily.  Informed pt that she has a follow-up lab appt on 09/13/2021 at 8:30 am.  Pt verbalized understanding of instructions and confirmed appt scheduled on 09/13/2021.  Pt had no further questions or concerns at this time. ?

## 2021-09-13 ENCOUNTER — Inpatient Hospital Stay: Payer: 59

## 2021-09-20 ENCOUNTER — Telehealth: Payer: Self-pay

## 2021-09-20 ENCOUNTER — Inpatient Hospital Stay: Payer: 59

## 2021-09-20 ENCOUNTER — Other Ambulatory Visit: Payer: Self-pay

## 2021-09-20 DIAGNOSIS — Z8673 Personal history of transient ischemic attack (TIA), and cerebral infarction without residual deficits: Secondary | ICD-10-CM | POA: Diagnosis not present

## 2021-09-20 DIAGNOSIS — Z86718 Personal history of other venous thrombosis and embolism: Secondary | ICD-10-CM | POA: Diagnosis not present

## 2021-09-20 DIAGNOSIS — Z7901 Long term (current) use of anticoagulants: Secondary | ICD-10-CM | POA: Diagnosis not present

## 2021-09-20 DIAGNOSIS — I82220 Acute embolism and thrombosis of inferior vena cava: Secondary | ICD-10-CM

## 2021-09-20 DIAGNOSIS — R072 Precordial pain: Secondary | ICD-10-CM | POA: Diagnosis not present

## 2021-09-20 DIAGNOSIS — L732 Hidradenitis suppurativa: Secondary | ICD-10-CM | POA: Diagnosis not present

## 2021-09-20 LAB — CBC WITH DIFFERENTIAL (CANCER CENTER ONLY)
Abs Immature Granulocytes: 0.01 10*3/uL (ref 0.00–0.07)
Basophils Absolute: 0 10*3/uL (ref 0.0–0.1)
Basophils Relative: 1 %
Eosinophils Absolute: 0.1 10*3/uL (ref 0.0–0.5)
Eosinophils Relative: 2 %
HCT: 40.4 % (ref 36.0–46.0)
Hemoglobin: 12.1 g/dL (ref 12.0–15.0)
Immature Granulocytes: 0 %
Lymphocytes Relative: 29 %
Lymphs Abs: 1.4 10*3/uL (ref 0.7–4.0)
MCH: 24.2 pg — ABNORMAL LOW (ref 26.0–34.0)
MCHC: 30 g/dL (ref 30.0–36.0)
MCV: 80.8 fL (ref 80.0–100.0)
Monocytes Absolute: 0.6 10*3/uL (ref 0.1–1.0)
Monocytes Relative: 13 %
Neutro Abs: 2.6 10*3/uL (ref 1.7–7.7)
Neutrophils Relative %: 55 %
Platelet Count: 171 10*3/uL (ref 150–400)
RBC: 5 MIL/uL (ref 3.87–5.11)
RDW: 17.2 % — ABNORMAL HIGH (ref 11.5–15.5)
WBC Count: 4.7 10*3/uL (ref 4.0–10.5)
nRBC: 0 % (ref 0.0–0.2)

## 2021-09-20 LAB — PROTIME-INR
INR: 2.1 — ABNORMAL HIGH (ref 0.8–1.2)
Prothrombin Time: 23.6 seconds — ABNORMAL HIGH (ref 11.4–15.2)

## 2021-09-20 NOTE — Telephone Encounter (Signed)
This nurse reached out to this patient per provider to verify what dose of Coumadin she is currently taking and make sure that she is taking it daily. Patient states that she was taking 10 mg of Coumadin until they ran out and yesterday and today she took 7.5 mg and just picked up the refill from the pharmacy and will resume the 10 mg tomorrow.  This nurse advised that this information will be forwarded to the provider for advisement.  No further questions or concerns at this time.   ?

## 2021-09-21 ENCOUNTER — Telehealth: Payer: Self-pay

## 2021-09-21 NOTE — Telephone Encounter (Signed)
This nurse reached ou to patient to inform her of new Coumadin orders per provider.  No answer and this nurse unable to leave a message.  This nurse will attempt to reach patient again via phone and will also send message on My chart.  No further concerns at this time.   ?

## 2021-09-22 ENCOUNTER — Other Ambulatory Visit: Payer: Self-pay | Admitting: Nurse Practitioner

## 2021-09-26 DIAGNOSIS — Z86718 Personal history of other venous thrombosis and embolism: Secondary | ICD-10-CM | POA: Insufficient documentation

## 2021-09-27 ENCOUNTER — Inpatient Hospital Stay (HOSPITAL_BASED_OUTPATIENT_CLINIC_OR_DEPARTMENT_OTHER): Payer: 59 | Admitting: Nurse Practitioner

## 2021-09-27 ENCOUNTER — Inpatient Hospital Stay: Payer: 59

## 2021-09-27 ENCOUNTER — Other Ambulatory Visit: Payer: Self-pay

## 2021-09-27 ENCOUNTER — Encounter: Payer: Self-pay | Admitting: Nurse Practitioner

## 2021-09-27 DIAGNOSIS — L732 Hidradenitis suppurativa: Secondary | ICD-10-CM | POA: Diagnosis not present

## 2021-09-27 DIAGNOSIS — Z7901 Long term (current) use of anticoagulants: Secondary | ICD-10-CM | POA: Diagnosis not present

## 2021-09-27 DIAGNOSIS — I82220 Acute embolism and thrombosis of inferior vena cava: Secondary | ICD-10-CM

## 2021-09-27 DIAGNOSIS — Z8673 Personal history of transient ischemic attack (TIA), and cerebral infarction without residual deficits: Secondary | ICD-10-CM | POA: Diagnosis not present

## 2021-09-27 DIAGNOSIS — Z86718 Personal history of other venous thrombosis and embolism: Secondary | ICD-10-CM

## 2021-09-27 DIAGNOSIS — R072 Precordial pain: Secondary | ICD-10-CM | POA: Diagnosis not present

## 2021-09-27 LAB — PROTIME-INR
INR: 2.5 — ABNORMAL HIGH (ref 0.8–1.2)
Prothrombin Time: 26.8 seconds — ABNORMAL HIGH (ref 11.4–15.2)

## 2021-09-27 NOTE — Progress Notes (Signed)
?[facility]   ?[phone] [phone]   ?Clinic Follow up Note  ? ?Patient Care Team: ?Fleet ContrasAvbuere, Edwin, MD as PCP - General (Internal Medicine) ?Drema DallasJaffe, Adam R, DO as Consulting Physician (Neurology) ?Leonie DouglasHawken, Thomas N, MD as Consulting Physician (Vascular Surgery) ?Leane Callavanaugh, Maureen A, RN as Case Manager ?09/27/2021 ? ?CHIEF COMPLAINT: Follow-up DVT/PE ? 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  She underwent hypercoagulopathy work-up which was negative except low protein S activity (22), lupus anticoagulant and antiphospholipid syndrome antibodies were negative.  She was discharged home with Lovenox injection, but she was noncompliant and stopped on her own.  She does not remember how long she was on it. ?She was admitted on January 23, 2021 for near occlusive right side PE with pulmonary infarction, IVC thrombosis, Doppler was negative for lower extremity DVT.  Patient was treated with Lovenox injection and transition to Coumadin. Not sure about compliance with coumadin.  ?Patient was readmitted on March 22, 2021 for epigastric and back pain for 2 weeks, CT scan showed extensive thrombosis involving infrarenal IVC, bilateral common iliac, right external iliac.  She was started on heparin drip, underwent tPA thrombolysis for 24 hours, then mechanical thrombectomy by vascular surgery on March 24, 2021.  She was transitioned to Xarelto. Per pt she has been compliant with Xarelto, no missing dose before current admission.  ?Readmitted on/30/22 for worsening left leg and epigastric pain.  CTA showed persistent but improved DVT involving the right common iliac, right external iliac and the bilateral internal iliac vein.  Doppler showed acute DVT in bilateral lower extremities.  Xarelto was stopped, treated with heparin drip in the hospital and transition to Coumadin with goal INR  2.5-3.5.  Discharged 10/8 on Lovenox bridge that was d/c'd after reaching therapeutic INR on coumadin ? ?CURRENT THERAPY:  Coumadin 7.5 mg daily except 10 mg Wednesdays and Fridays, due to her therapeutic level 05/11/2021, changed to 7.5 mg daily and 10 mg on Wednesdays only on 06/21/21. Became subtherapeutic after taking doxy for skin infection now on coumadin 10 mg daily.  ?Goal INR 2.5 - 3.5  ? ?INTERVAL HISTORY: Andrea Hayes returns for follow-up as scheduled, last seen by me 08/02/2021.  Her INR has been subtherapeutic lately.on 2/22 INR 2.4 which was close to goal, I recommended to continue same dose 10 mg daily.  On 3 8 INR remained 2.4, I recommended to take 15 mg that day then resume 10 mg daily which she reportedly did that.  Over the next 2 weeks she ran out of Coumadin 10 mg tabs and was taking old prescription 7.5 mg tabs for 2 days prior to INR on 3/22 which was down to 2.1.  She filled the 10 mg prescription and resumed that dose starting 3/22.  ? ?She denies changes.  Since her thrombectomy surgery 6 months ago she has nonpainful left foot swelling at the end of a long day if she has been working or standing.  This resolves overnight "if I sleep well."  She has a compression sock but does not wear it.  She denies new or worsening swelling, new cough, chest pain, dyspnea, or bleeding on Coumadin.  She started menstrual period 3/27, bleeding is not heavy. ? ? ?MEDICAL HISTORY:  ?Past Medical History:  ?Diagnosis Date  ? Stroke [facility](HCC)   ? ? ?SURGICAL HISTORY: ?Past Surgical History:  ?Procedure Laterality Date  ? BIOPSY  04/05/2021  ?  Procedure: BIOPSY;  Surgeon: Jenel Lucks, MD;  Location: [facility] ENDOSCOPY;  Service: Gastroenterology;;  ? COLONOSCOPY N/A 04/05/2021  ? Procedure: COLONOSCOPY;  Surgeon: Jenel Lucks, MD;  Location: [facility] ENDOSCOPY;  Service: Gastroenterology;  Laterality: N/A;  ? ESOPHAGOGASTRODUODENOSCOPY N/A 04/05/2021  ? Procedure: ESOPHAGOGASTRODUODENOSCOPY (EGD);  Surgeon:  Jenel Lucks, MD;  Location: [facility] ENDOSCOPY;  Service: Gastroenterology;  Laterality: N/A;  ? NO PAST SURGERIES    ? PERIPHERAL VASCULAR BALLOON ANGIOPLASTY  03/24/2021  ? Procedure: PERIPHERAL VASCULAR BALLOON ANGIOPLASTY;  Surgeon: Leonie Douglas, MD;  Location: [facility] INVASIVE CV LAB;  Service: Cardiovascular;;  bilateral common iliacs  ? PERIPHERAL VASCULAR THROMBECTOMY N/A 03/23/2021  ? Procedure: PERIPHERAL VASCULAR THROMBECTOMY;  Surgeon: Maeola Harman, MD;  Location: [facility] INVASIVE CV LAB;  Service: Cardiovascular;  Laterality: N/A;  ? PERIPHERAL VASCULAR THROMBECTOMY N/A 03/24/2021  ? Procedure: LYSIS RECHECK;  Surgeon: Leonie Douglas, MD;  Location: [facility] INVASIVE CV LAB;  Service: Cardiovascular;  Laterality: N/A;  ? ? ?I have reviewed the social history and family history with the patient and they are unchanged from previous note. ? ?ALLERGIES:  has No Known Allergies. ? ?MEDICATIONS:  ?Current Outpatient Medications  ?Medication Sig Dispense Refill  ? Vitamin D, Ergocalciferol, (DRISDOL) 1.25 MG (50000 UNIT) CAPS capsule Take 50,000 Units by mouth every Wednesday.    ? warfarin (COUMADIN) 10 MG tablet TAKE 1 TABLET BY MOUTH AS DIRECTED BY NP/MD 30 tablet 1  ? acetaZOLAMIDE (DIAMOX) 125 MG tablet Take 2 tablets (250 mg total) by mouth 2 (two) times daily. (Patient not taking: Reported on 06/06/2021) 120 tablet 5  ? acetaZOLAMIDE (DIAMOX) 250 MG tablet Take 500 mg by mouth 2 (two) times daily. (Patient not taking: Reported on 06/09/2021)    ? benzonatate (TESSALON) 100 MG capsule Take 1 capsule (100 mg total) by mouth 3 (three) times daily. (Patient not taking: Reported on 06/06/2021) 20 capsule 0  ? clotrimazole (CLOTRIMAZOLE 3) 2 % vaginal cream Place 1 Applicatorful vaginally at bedtime. 21 g 0  ? docusate sodium (COLACE) 100 MG capsule Take 1 capsule (100 mg total) by mouth at bedtime. 10 capsule 0  ? ferrous fumarate-b12-vitamic C-folic acid (TRINSICON / FOLTRIN) capsule Take 1 capsule by mouth  2 (two) times daily after a meal. 180 capsule 0  ? HYDROcodone-acetaminophen (NORCO/VICODIN) 5-325 MG tablet Take 1-2 tablets by mouth every 4 (four) hours as needed.    ? metoprolol succinate (TOPROL-XL) 25 MG 24 hr tablet Take 1 tablet (25 mg total) by mouth daily. 30 tablet 1  ? polyethylene glycol (MIRALAX / GLYCOLAX) 17 g packet Take 17 g by mouth daily. 14 each 0  ? Prenatal Vit-Fe Fumarate-FA (PRENATAL VITAMIN) 27-0.8 MG TABS Take 1 tablet by mouth daily. 30 tablet 12  ? ?No current facility-administered medications for this visit.  ? ? ?PHYSICAL EXAMINATION: ? ?Vitals:  ? 09/27/21 0906  ?BP: 109/76  ?Pulse: 77  ?Resp: 18  ?Temp: 98.2 ?F (36.8 ?C)  ?SpO2: 98%  ? ?Filed Weights  ? 09/27/21 0906  ?Weight: 142 lb 12.8 oz (64.8 kg)  ? ? ?GENERAL:alert, no distress and comfortable ?SKIN: No rash ?EYES: sclera clear ?LUNGS: normal breathing effort ?HEART:  no lower extremity or pedal edema ?NEURO: alert & oriented x 3 with fluent speech, no focal motor/sensory deficits ? ?LABORATORY DATA:  ?I have reviewed the data as listed ? ?  Latest Ref Rng & Units 09/20/2021  ?  9:02 AM 08/23/2021  ?  9:01 AM 07/20/2021  ?  9:42 AM  ?CBC  ?WBC 4.0 - 10.5 K/uL 4.7   4.9   4.7    ?Hemoglobin 12.0 - 15.0 g/dL 62.7   03.5   00.9    ?Hematocrit 36.0 - 46.0 % 40.4   37.6   37.5    ?Platelets 150 - 400 K/uL 171   150   150    ? ? ? ? ?  Latest Ref Rng & Units 08/02/2021  ? 12:45 PM 04/08/2021  ?  8:05 AM 04/05/2021  ?  3:20 PM  ?CMP  ?Glucose 70 - 99 mg/dL  95     ?BUN 6 - 20 mg/dL  5     ?Creatinine 0.44 - 1.00 mg/dL 3.81   8.29     ?Sodium 135 - 145 mmol/L  135     ?Potassium 3.5 - 5.1 mmol/L  3.9     ?Chloride 98 - 111 mmol/L  106     ?CO2 22 - 32 mmol/L  18     ?Calcium 8.9 - 10.3 mg/dL  9.4     ?Total Protein 6.5 - 8.1 g/dL   7.6    ?Total Bilirubin 0.3 - 1.2 mg/dL   0.2    ?Alkaline Phos 38 - 126 U/L   305    ?AST 15 - 41 U/L   37    ?ALT 0 - 44 U/L   79    ? ? ? ? ?RADIOGRAPHIC STUDIES: ?I have personally reviewed the radiological  images as listed and agreed with the findings in the report. ?No results found.  ? ?ASSESSMENT & PLAN: 31 year old female ?  ? ?1.  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

## 2021-09-29 ENCOUNTER — Telehealth: Payer: Self-pay | Admitting: Hematology

## 2021-09-29 NOTE — Telephone Encounter (Signed)
Scheduled follow-up appointments per 3/29 los. Patient is aware. ?

## 2021-10-03 ENCOUNTER — Other Ambulatory Visit: Payer: Self-pay

## 2021-10-03 NOTE — Patient Outreach (Signed)
Care Coordination ? ?10/03/2021 ? ?Emely Dykman ?02-26-1991 ?[id number] ? ?10/03/2021 ?Name: Andrea Hayes MRN: [id number] DOB: 02/24/1991 ? ?Referred by: [name], MD ?Reason for referral : High Risk Managed Medicaid (Unsuccessful Telephone Outreach ) ? ? ?An unsuccessful telephone outreach was attempted today. The patient was referred to the case management team for assistance with care management and care coordination.   ? ?Attempted 3 seperate telephone calls today.  No answer.  No available voicemail.  Did not allow a message to be left. ? ?Follow Up Plan: The Managed Medicaid care management team will reach out to the patient again over the next 7 - 14  days.  ? ? ?Salvatore Marvel RN, BSN ?Community Care Coordinator ?[facility] ?Mobile: [phone]  ?

## 2021-10-03 NOTE — Patient Instructions (Signed)
Amarylis Convery ,  ? ?The [facility] Managed Care Team is available to provide assistance to you with your healthcare needs at no cost and as a benefit of your [facility] Health plan.  ? ?I'm sorry I was unable to reach you today for our scheduled appointment. Our care guide will call you to reschedule our telephone appointment. Please call me at the number below. I am available to be of assistance to you regarding your healthcare needs. .  ? ?Thank you,  ? ?Virgina Norfolk RN, BSN ?Community Care Coordinator ?[place]  [facility] ?Mobile: [phone]   ?

## 2021-10-10 ENCOUNTER — Telehealth: Payer: Self-pay

## 2021-10-10 NOTE — Telephone Encounter (Signed)
This nurse reached out to patient on 4/10 related to My Chart message stating that she is having blood come up and requesting to speak to provider.  This nurse was unable to reach the patient will attempt again.   ?

## 2021-10-10 NOTE — Telephone Encounter (Signed)
This nurse spoke with patient who clarified her My Chart message she stated that she started her menstrual cycle on Thursday April 6th and she is still menstruating, states that she would like to Santiago Glad, NP aware. This nurse advised patient that NP is out of office this week.  Also asked patient if she was having a heavy flow and she said no she is not.  This nurse verified that she is taking her Coumadin as prescribed and consistently.  This nurse advised that information will be forwarded to the provider and she will be contacted if the provider has any recommendations. No more questions or concerns at this time.   ?

## 2021-10-11 ENCOUNTER — Other Ambulatory Visit: Payer: Self-pay

## 2021-10-11 ENCOUNTER — Telehealth: Payer: Self-pay

## 2021-10-11 ENCOUNTER — Inpatient Hospital Stay: Payer: 59 | Attending: Nurse Practitioner

## 2021-10-11 DIAGNOSIS — Z7901 Long term (current) use of anticoagulants: Secondary | ICD-10-CM | POA: Insufficient documentation

## 2021-10-11 DIAGNOSIS — Z86718 Personal history of other venous thrombosis and embolism: Secondary | ICD-10-CM | POA: Insufficient documentation

## 2021-10-11 DIAGNOSIS — I82220 Acute embolism and thrombosis of inferior vena cava: Secondary | ICD-10-CM

## 2021-10-11 LAB — PROTIME-INR
INR: 3 — ABNORMAL HIGH (ref 0.8–1.2)
Prothrombin Time: 30.6 seconds — ABNORMAL HIGH (ref 11.4–15.2)

## 2021-10-11 LAB — CMP (CANCER CENTER ONLY)
ALT: 365 U/L (ref 0–44)
AST: 102 U/L — ABNORMAL HIGH (ref 15–41)
Albumin: 3.9 g/dL (ref 3.5–5.0)
Alkaline Phosphatase: 293 U/L — ABNORMAL HIGH (ref 38–126)
Anion gap: 6 (ref 5–15)
BUN: 7 mg/dL (ref 6–20)
CO2: 28 mmol/L (ref 22–32)
Calcium: 9.3 mg/dL (ref 8.9–10.3)
Chloride: 104 mmol/L (ref 98–111)
Creatinine: 0.7 mg/dL (ref 0.44–1.00)
GFR, Estimated: >60 mL/min (ref >60)
Glucose, Bld: 89 mg/dL (ref 70–99)
Potassium: 4.2 mmol/L (ref 3.5–5.1)
Sodium: 138 mmol/L (ref 135–145)
Total Bilirubin: 0.3 mg/dL (ref 0.3–1.2)
Total Protein: 8.4 g/dL — ABNORMAL HIGH (ref 6.5–8.1)

## 2021-10-11 NOTE — Telephone Encounter (Signed)
CRITICAL VALUE STICKER ? ?CRITICAL VALUE:  ALT =365 ? ?RECEIVER (on-site recipient of call): Jauan Wohl P. LPN ? ?DATE & TIME NOTIFIED: 04/12 /23 9:21 am ? ?MESSENGER (representative from lab): Ulice Dash ? ?MD NOTIFIED:  Dr. Burr Medico  ? ?TIME OF NOTIFICATION: 9:45 am ? ?

## 2021-10-25 ENCOUNTER — Inpatient Hospital Stay: Payer: 59

## 2021-10-26 ENCOUNTER — Other Ambulatory Visit: Payer: Self-pay

## 2021-10-26 NOTE — Patient Outreach (Signed)
Medicaid Managed Care   Nurse Care Manager Note  10/26/2021 Name:  Andrea Hayes MRN:  [id number] DOB:  07-13-1990  Andrea Hayes is an 31 y.o. year old female who is a primary patient of Fleet Contras, MD.  The Medicaid Managed Care Coordination team was consulted for assistance with:    HTN DVT/Stroke Management  Ms. Correia was given information about Medicaid Managed Care Coordination team services today. Andrea Hayes Patient agreed to services and verbal consent obtained.  Engaged with patient by telephone for follow up visit in response to provider referral for case management and/or care coordination services.   Assessments/Interventions:  Review of past medical history, allergies, medications, health status, including review of consultants reports, laboratory and other test data, was performed as part of comprehensive evaluation and provision of chronic care management services.  SDOH (Social Determinants of Health) assessments and interventions performed:   Care Plan  No Known Allergies  Medications Reviewed Today     Reviewed by Leane Call, RN (Case Manager) on 10/26/21 at 1131  Med List Status: <None>   Medication Order Taking? Sig Documenting Provider Last Dose Status Informant  acetaZOLAMIDE (DIAMOX) 125 MG tablet [id number] No Take 2 tablets (250 mg total) by mouth 2 (two) times daily.  Patient not taking: Reported on 06/06/2021   Andrea Dallas, DO Not Taking Active Self  acetaZOLAMIDE (DIAMOX) 250 MG tablet [id number] No Take 500 mg by mouth 2 (two) times daily.  Patient not taking: Reported on 06/09/2021   [provider] Not Taking Active   benzonatate (TESSALON) 100 MG capsule [id number] No Take 1 capsule (100 mg total) by mouth 3 (three) times daily.  Patient not taking: Reported on 06/06/2021   Rolly Salter, MD Not Taking Active   clotrimazole (CLOTRIMAZOLE 3) 2 % vaginal cream [id number] No Place 1 Applicatorful vaginally at bedtime. Andrea Samples, NP Taking Active   docusate sodium (COLACE) 100 MG capsule [id number] No Take 1 capsule (100 mg total) by mouth at bedtime. Rolly Salter, MD Taking Active   ferrous fumarate-b12-vitamic C-folic acid (TRINSICON / FOLTRIN) capsule [id number] No Take 1 capsule by mouth 2 (two) times daily after a meal. Andrea Samples, NP Taking Active   HYDROcodone-acetaminophen (NORCO/VICODIN) 5-325 MG tablet [id number] No Take 1-2 tablets by mouth every 4 (four) hours as needed. [provider] Taking Active   metoprolol succinate (TOPROL-XL) 25 MG 24 hr tablet [id number] No Take 1 tablet (25 mg total) by mouth daily. Andrea Courser, MD Taking Active Self  polyethylene glycol (MIRALAX / GLYCOLAX) 17 g packet [id number] No Take 17 g by mouth daily. Rolly Salter, MD Taking Active   Prenatal Vit-Fe Fumarate-FA (PRENATAL VITAMIN) 27-0.8 MG TABS [id number] No Take 1 tablet by mouth daily. Andrea Hayes, CNM Taking Active   Vitamin D, Ergocalciferol, (DRISDOL) 1.25 MG (50000 UNIT) CAPS capsule [id number] No Take 50,000 Units by mouth every Wednesday. [provider] Taking Active Self  warfarin (COUMADIN) 10 MG tablet [id number]  TAKE 1 TABLET BY MOUTH AS DIRECTED BY NP/MD Andrea Samples, NP  Active             Patient Active Problem List   Diagnosis Date Noted   History of recurrent deep vein thrombosis (DVT) 09/26/2021   Fecal occult blood test positive    Anemia    Lower extremity pain, anterior, left 04/01/2021   Abdominal pain 03/31/2021   DVT (deep venous thrombosis) (HCC) 03/22/2021   IVC thrombosis (HCC)  01/26/2021   Acute respiratory failure with hypoxia (HCC) 01/26/2021   Chronic right arterial ischemic stroke, MCA (middle cerebral artery) 01/23/2021   Pulmonary embolism (HCC) 01/23/2021   Leukocytosis 01/23/2021   Thrombocytopenia (HCC) 01/23/2021   Elevated liver enzymes 01/23/2021   Chest pain 01/23/2021   Back pain 01/23/2021   Vaginal delivery 12/05/2020    Headache 11/28/2020   HTN (hypertension) 11/28/2020   Severe preeclampsia, delivered 11/04/2020   IUGR (intrauterine growth restriction) affecting care of mother 10/31/2020   Thrombocytopenia affecting pregnancy (HCC) 05/27/2020   Acute right arterial ischemic stroke, middle cerebral artery (MCA) (HCC)    Dural venous sinus thrombosis    Idiopathic intracranial hypertension    Cerebral venous thrombosis 07/24/2018    Conditions to be addressed/monitored per PCP order:  HTN and DVT/Stroke Management  Care Plan : RN Care Manager Plan of Care  Updates made by Leane Call, RN since 10/26/2021 12:00 AM     Problem: Chronic Disease Management and Care Coordination Needs for Deep Vein Thrombosis/stroke management and HTN   Priority: High  Onset Date: 06/06/2021     Long-Range Goal: Development of Plan of Care for Management and Care Coordination Needs (DVT/stroke management and HTN   Start Date: 06/06/2021  Expected End Date: 12/29/2021  Recent Progress: On track  Priority: High  Note:   Current Barriers:  Knowledge Deficits related to plan of care for management of HTN and deep vein thrombosis, stroke  Care Coordination needs related to Limited education about deep vein thrombosis, anticoagulants, strokes* Chronic Disease Management support and education needs related to HTN and deep vein thrombosis, stroke.   RN CM Clinical Goal(s):  Patient will verbalize understanding of plan for management of HTN and recurrent Deep Vein Thrombosis (DVT), stroke as evidenced by good control of HTN and recurrent DVT verbalize basic understanding of  HTN and deep vein thrombosis, stroke disease process and self health management plan as evidenced by for deep vein thrombosis, stroke to include use of anticoagulants and diet and hypertension take all medications exactly as prescribed and will call provider for medication related questions as evidenced by medication compliance. attend all scheduled  medical appointments: weekly labs for INR due to daily coumadin as evidenced by attending all scheduled appointments demonstrate Improved and Ongoing adherence to prescribed treatment plan for HTN and deep vein thrombosis, stroke as evidenced by therapeutic INR values continue to work with RN Care Manager to address care management and care coordination needs related to  HTN and recurrent DVT, stroke prevention as evidenced by adherence to CM Team Scheduled appointments through collaboration with RN Care manager, provider, and care team.   Interventions: Follow up outreaches to review labs, review upcoming appointments, to educate and answer questions Inter-disciplinary care team collaboration (see longitudinal plan of care) Evaluation of current treatment plan related to  self management and patient's adherence to plan as established by provider Follow up with patient to assess for any worsening symptoms related to DVT or care coordination needs Reviewed recent labs, recent blood pressure readings and discussed importance of hydration and it's affect on blood pressure. No reported leg pain, swelling, no headaches Reviewed upcoming appointments with patient.  Deep vein thrombosis, stroke Goal on track:  Yes. Evaluation of current treatment plan related to  deep vein thrombosis, stroke , Limited education about home management, medicine, diet related to deep vein thrombosis, stroke* self-management and patient's adherence to plan as established by provider. Discussed plans with patient for ongoing care management follow  up and provided patient with direct contact information for care management team Evaluation of current treatment plan related to deep vein thrombosis, stroke and patient's adherence to plan as established by provider Provided education to patient re: DVT, Stroke, anticoagulants, bleeding risks, INR values with therapeutic range of 2.5-3.5 Reviewed medications with patient and  discussed action plan, worsening symptoms to call MD office for any changes Reviewed scheduled/upcoming provider appointments including Discussed plans with patient for ongoing care management follow up and provided patient with direct contact information for care management team Patient continues to have weekly lab work drawn for INR.  Patient currently on Coumadin 10 mg daily and is in therapeutic range of 2.5 - 3.5 Provided education on Hayes & symptoms of DVT: swelling, redness and warmth/heat at site.  Patient verbalized understanding.   Hypertension Interventions:  (Status:  Goal on track:  Yes.) Long Term Goal Last practice recorded BP readings:  BP Readings from Last 3 Encounters:  09/27/21 109/76  08/02/21 103/64  06/21/21 107/67     Most recent eGFR/CrCl: No results found for: EGFR  No components found for: CRCL  Evaluation of current treatment plan related to hypertension self management and patient's adherence to plan as established by provider Reviewed medications with patient and discussed importance of compliance Discussed plans with patient for ongoing care management follow up and provided patient with direct contact information for care management team Reviewed scheduled/upcoming provider appointments including:   Reinforced education with patient on the importance of staying hydrated and it's direct affect on blood pressure.  Patient states she drinks "A Lot" of fluids throughout the day.  Patient denies any light headed or dizziness.   Patient verbalized understanding. Patient reports "very bad" headaches daily.  She is taking Tylenol (did not know dosage), twice a day (morning and afternoon), with little pain relief.  We reviewed the amount of Tylenol she is taking daily since her liver function lab results are extremely elevated.  Patient taking Tylenol 2 tabs BID.  I provided education that the maximum dose of Tylenol is 3000 mg per day and can cause liver disease if over  the maximum daily dose.  Patient reports she saw PCP recently (last week) and is unsure of the date.  This RN Case Manager does not see any documentation regarding this recent PCP visit.  Patient stated that PCP thinks her headaches are due to allergies since patient is experiencing allergy symptoms including runny nose, congestion and cough.  Patient reports the PCP recommended allergy medication which patient has started.  Patient does not know the name of the allergy medication.  Patient is asking for a stronger pain medication for daily headaches.  This RN Care Manager will follow up with PCP regarding this concern.   Patient Goals/Self-Care Activities: Take all medications as prescribed Attend all scheduled provider appointments Perform all self care activities independently  Perform IADL's (shopping, preparing meals, housekeeping, managing finances) independently Call provider office for new concerns or questions   Follow Up Plan:  The patient has been provided contact information via My Chart link for the care management team and has been advised to call with any health related questions or concerns.        Follow Up:  Patient agrees to Care Plan and Follow-up.  Plan: The Managed Medicaid care management team will reach out to the patient again over the next 30 days.  Date/time of next scheduled RN care management/care coordination outreach:  Nov 23, 2021 at 10:00 am  Virgina Norfolk RN, BSN Community Care Coordinator [facility]  [facility] Mobile: [phone]

## 2021-10-26 NOTE — Patient Instructions (Signed)
Visit Information ? ?Ms. Threat was given information about Medicaid Managed Care team care coordination services as a part of their [facility] Medicaid benefit. Emojean Van verbally consented to engagement with the [facility] Managed Care team.  ? ?If you are experiencing a medical emergency, please call 911 or report to your local emergency department or urgent care.  ? ?If you have a non-emergency medical problem during routine business hours, please contact your provider's office and ask to speak with a nurse.  ? ?For questions related to your [facility] health plan, please call: [phone] or go here:[email or web address] ? ?If you would like to schedule transportation through your [facility] plan, please call the following number at least 2 days in advance of your appointment: [phone]. ? You can also use the MTM portal or MTM mobile app to manage your rides. For the portal, please go to [email or web address]. ? ?Call the [place] at [phone], at any time, 24 hours a day, 7 days a week. If you are in danger or need immediate medical attention call 911. ? ?If you would like help to quit smoking, call 1-800-QUIT-NOW 475-357-1667) OR Espa?ol: 1-855-D?jelo-Ya 417-686-7045) o para m?s informaci?n haga clic aqu? or Text READY to 200-400 to register via text ? ?Ms. Baetz - following are the goals we discussed in your visit today: Please see Patient Goals in [place] of Care below. ? ?Please see education materials related to today's visit provided by MyChart link. ? ?Patient verbalizes understanding of instructions and care plan provided today and agrees to view in [place]. Active MyChart status confirmed with patient.   ? ?The Managed Medicaid care management team will reach out to the patient again over the next 30 days.  ? ?Salvatore Marvel RN, BSN ?Community Care Coordinator ?[place] [facility] ?Mobile: [phone]   ? ?Following is a copy of your plan of care:  ?Care Plan : [place] of Care  ?Updates made by Inge Rise, RN since 10/26/2021 12:00 AM  ?  ? ?Problem: Chronic Disease Management and Care Coordination Needs for Deep Vein Thrombosis/stroke management and HTN   ?Priority: High  ?Onset Date: 06/06/2021  ?  ? ?Long-Range Goal: Development of Plan of Care for Management and Care Coordination Needs (DVT/stroke management and HTN   ?Start Date: 06/06/2021  ?Expected End Date: 12/29/2021  ?Recent Progress: On track  ?Priority: High  ?Note:   ?Current Barriers:  ?Knowledge Deficits related to plan of care for management of HTN and deep vein thrombosis, stroke  ?Care Coordination needs related to Limited education about deep vein thrombosis, anticoagulants, strokes* ?Chronic Disease Management support and education needs related to HTN and deep vein thrombosis, stroke.  ? ?RN CM Clinical Goal(s):  ?Patient will verbalize understanding of plan for management of HTN and recurrent Deep Vein Thrombosis (DVT), stroke as evidenced by good control of HTN and recurrent DVT ?verbalize basic understanding of  HTN and deep vein thrombosis, stroke disease process and self health management plan as evidenced by for deep vein thrombosis, stroke to include use of anticoagulants and diet and hypertension ?take all medications exactly as prescribed and will call provider for medication related questions as evidenced by medication compliance. ?attend all scheduled medical appointments: weekly labs for INR due to daily coumadin as evidenced by attending all scheduled appointments ?demonstrate Improved and Ongoing adherence to prescribed treatment plan for HTN and deep vein thrombosis, stroke as evidenced by therapeutic INR values ?continue to work  with RN Care Manager to address care management and care coordination needs related to  HTN and recurrent DVT, stroke prevention as evidenced by adherence to CM Team Scheduled  appointments through collaboration with RN Care manager, provider, and care team.  ? ?Interventions: ?Follow up outreaches to review labs, review upcoming appointments, to educate and answer questions ?Inter-disciplinary care team collaboration (see longitudinal plan of care) ?Evaluation of current treatment plan related to  self management and patient's adherence to plan as established by provider ?Follow up with patient to assess for any worsening symptoms related to DVT or care coordination needs ?Reviewed recent labs, recent blood pressure readings and discussed importance of hydration and it's affect on blood pressure. ?No reported leg pain, swelling, no headaches ?Reviewed upcoming appointments with patient. ? ?Deep vein thrombosis, stroke Goal on track:  Yes. ?Evaluation of current treatment plan related to  deep vein thrombosis, stroke , Limited education about home management, medicine, diet related to deep vein thrombosis, stroke* self-management and patient's adherence to plan as established by provider. ?Discussed plans with patient for ongoing care management follow up and provided patient with direct contact information for care management team ?Evaluation of current treatment plan related to deep vein thrombosis, stroke and patient's adherence to plan as established by provider ?Provided education to patient re: DVT, Stroke, anticoagulants, bleeding risks, INR values with therapeutic range of 2.5-3.5 ?Reviewed medications with patient and discussed action plan, worsening symptoms to call MD office for any changes ?Reviewed scheduled/upcoming provider appointments including ?Discussed plans with patient for ongoing care management follow up and provided patient with direct contact information for care management team ?Patient continues to have weekly lab work drawn for INR.  Patient currently on Coumadin 10 mg daily and is in therapeutic range of 2.5 - 3.5 ?Provided education on signs & symptoms of  DVT: swelling, redness and warmth/heat at site.  Patient verbalized understanding. ? ? ?Hypertension Interventions:  (Status:  Goal on track:  Yes.) Long Term Goal ?Last practice recorded BP readings:  ?BP Readings from Last 3 Encounters:  ?09/27/21 109/76  ?08/02/21 103/64  ?06/21/21 107/67  ?   ?Most recent eGFR/CrCl: No results found for: EGFR  No components found for: CRCL ? ?Evaluation of current treatment plan related to hypertension self management and patient's adherence to plan as established by provider ?Reviewed medications with patient and discussed importance of compliance ?Discussed plans with patient for ongoing care management follow up and provided patient with direct contact information for care management team ?Reviewed scheduled/upcoming provider appointments including:  ? Reinforced education with patient on the importance of staying hydrated and it's direct affect on blood pressure.  Patient states she drinks "A Lot" of fluids throughout the day.  Patient denies any light headed or dizziness.   Patient verbalized understanding. ?Patient reports "very bad" headaches daily.  She is taking Tylenol (did not know dosage), twice a day (morning and afternoon), with little pain relief.  We reviewed the amount of Tylenol she is taking daily since her liver function lab results are extremely elevated.  Patient taking Tylenol 2 tabs BID.  I provided education that the maximum dose of Tylenol is 3000 mg per day and can cause liver disease if over the maximum daily dose.  Patient reports she saw PCP recently (last week) and is unsure of the date.  This RN Case Manager does not see any documentation regarding this recent PCP visit.  Patient stated that PCP thinks her headaches are due to allergies since  patient is experiencing allergy symptoms including runny nose, congestion and cough.  Patient reports the PCP recommended allergy medication which patient has started.  Patient does not know the name of the  allergy medication.  Patient is asking for a stronger pain medication for daily headaches.  This RN Care Manager will follow up with PCP regarding this concern. ? ? ?Patient Goals/Self-Care Activities: ?Take all medications

## 2021-11-01 ENCOUNTER — Encounter (HOSPITAL_COMMUNITY): Payer: Self-pay | Admitting: Emergency Medicine

## 2021-11-01 ENCOUNTER — Emergency Department (HOSPITAL_COMMUNITY): Payer: Medicaid Other

## 2021-11-01 ENCOUNTER — Emergency Department (HOSPITAL_COMMUNITY)
Admission: EM | Admit: 2021-11-01 | Discharge: 2021-11-01 | Disposition: A | Discharge status: Home / Self Care | Payer: Medicaid Other | Attending: Emergency Medicine | Admitting: Emergency Medicine

## 2021-11-01 ENCOUNTER — Other Ambulatory Visit: Payer: Self-pay

## 2021-11-01 DIAGNOSIS — Z79899 Other long term (current) drug therapy: Secondary | ICD-10-CM | POA: Insufficient documentation

## 2021-11-01 DIAGNOSIS — R519 Headache, unspecified: Secondary | ICD-10-CM | POA: Diagnosis not present

## 2021-11-01 DIAGNOSIS — Z7901 Long term (current) use of anticoagulants: Secondary | ICD-10-CM | POA: Diagnosis not present

## 2021-11-01 DIAGNOSIS — G43701 Chronic migraine without aura, not intractable, with status migrainosus: Secondary | ICD-10-CM

## 2021-11-01 DIAGNOSIS — I10 Essential (primary) hypertension: Secondary | ICD-10-CM | POA: Insufficient documentation

## 2021-11-01 LAB — CBC WITH DIFFERENTIAL/PLATELET
Abs Immature Granulocytes: 0.02 10*3/uL (ref 0.00–0.07)
Basophils Absolute: 0.1 10*3/uL (ref 0.0–0.1)
Basophils Relative: 1 %
Eosinophils Absolute: 0.1 10*3/uL (ref 0.0–0.5)
Eosinophils Relative: 1 %
HCT: 42.7 % (ref 36.0–46.0)
Hemoglobin: 12.5 g/dL (ref 12.0–15.0)
Immature Granulocytes: 0 %
Lymphocytes Relative: 29 %
Lymphs Abs: 2.2 10*3/uL (ref 0.7–4.0)
MCH: 24.6 pg — ABNORMAL LOW (ref 26.0–34.0)
MCHC: 29.3 g/dL — ABNORMAL LOW (ref 30.0–36.0)
MCV: 83.9 fL (ref 80.0–100.0)
Monocytes Absolute: 0.8 10*3/uL (ref 0.1–1.0)
Monocytes Relative: 11 %
Neutro Abs: 4.3 10*3/uL (ref 1.7–7.7)
Neutrophils Relative %: 58 %
Platelets: 161 10*3/uL (ref 150–400)
RBC: 5.09 MIL/uL (ref 3.87–5.11)
RDW: 16.2 % — ABNORMAL HIGH (ref 11.5–15.5)
WBC: 7.5 10*3/uL (ref 4.0–10.5)
nRBC: 0 % (ref 0.0–0.2)

## 2021-11-01 LAB — BASIC METABOLIC PANEL
Anion gap: 8 (ref 5–15)
BUN: 6 mg/dL (ref 6–20)
CO2: 27 mmol/L (ref 22–32)
Calcium: 9.7 mg/dL (ref 8.9–10.3)
Chloride: 102 mmol/L (ref 98–111)
Creatinine, Ser: 0.62 mg/dL (ref 0.44–1.00)
GFR, Estimated: >60 mL/min (ref >60)
Glucose, Bld: 88 mg/dL (ref 70–99)
Potassium: 4 mmol/L (ref 3.5–5.1)
Sodium: 137 mmol/L (ref 135–145)

## 2021-11-01 LAB — PROTIME-INR
INR: 1.2 (ref 0.8–1.2)
Prothrombin Time: 15.5 seconds — ABNORMAL HIGH (ref 11.4–15.2)

## 2021-11-01 MED ORDER — PROCHLORPERAZINE EDISYLATE 10 MG/2ML IJ SOLN
10.0000 mg | Freq: Once | INTRAMUSCULAR | Status: AC
  Administered 2021-11-01: 10 mg via INTRAVENOUS
  Filled 2021-11-01: qty 2

## 2021-11-01 MED ORDER — KETOROLAC TROMETHAMINE 15 MG/ML IJ SOLN
15.0000 mg | Freq: Once | INTRAMUSCULAR | Status: AC
  Administered 2021-11-01: 15 mg via INTRAVENOUS
  Filled 2021-11-01: qty 1

## 2021-11-01 MED ORDER — DEXAMETHASONE SODIUM PHOSPHATE 10 MG/ML IJ SOLN
10.0000 mg | Freq: Once | INTRAMUSCULAR | Status: AC
  Administered 2021-11-01: 10 mg via INTRAVENOUS
  Filled 2021-11-01: qty 1

## 2021-11-01 NOTE — ED Notes (Signed)
Pt ambulatory to waiting room. Pt verbalized understanding of discharge instructions.   

## 2021-11-01 NOTE — ED Provider Triage Note (Signed)
Emergency Medicine Provider Triage Evaluation Note ? ?Andrea Hayes , a 31 y.o. female  was evaluated in triage.  Pt complains of right-sided headache.  She does have history of migraines.  States this feels similar to her previous history of migraines.  Denies weakness, or syncope.  Denies recent illness.  States pain was severe that she was unable to go to work today or sleep tonight. ? ?Review of Systems  ?Positive: As above ?Negative: As above ? ?Physical Exam  ?BP (!) 131/91 (BP Location: Right Arm)   Pulse 91   Temp 98.3 ?F (36.8 ?C) (Oral)   Resp 16   SpO2 100%  ?Gen:   Awake, no distress   ?Resp:  Normal effort  ?MSK:   Moves extremities without difficulty  ?Other:  Neuro exam is nonfocal.  Range of motion and strength in bilateral upper and lower extremities and flexor muscle groups.  Sensation intact.  Cranial nerves III through XII intact. ? ?Medical Decision Making  ?Medically screening exam initiated at 1:40 AM.  Appropriate orders placed.  Andrea Hayes was informed that the remainder of the evaluation will be completed by another provider, this initial triage assessment does not replace that evaluation, and the importance of remaining in the ED until their evaluation is complete. ? ? ?  ?Andrea [place], PA-C ?11/01/21 0141 ? ?

## 2021-11-01 NOTE — ED Provider Notes (Signed)
?[facility] ?Provider Note ? ? ?CSN: [id number] ?Arrival date & time: 11/01/21  0113 ? ?  ? ?History ? ?Chief Complaint  ?Patient presents with  ? Migraine  ? ? ?Andrea Hayes is a 31 y.o. female. ? ? ?Migraine ?Associated symptoms include headaches. Pertinent negatives include no chest pain and no shortness of breath.  ?31 year old female presents emergency department with right-sided headache.  Patient says that she has a history of migraine which is controlled with at home Tylenol, but this episode has been worse in severity and also different in nature.  Usually, her headaches are bilateral in nature and last a matter of hours.  This headache is right-sided, located behind her eye and into the back of her neck.  She describes it as the worst headache she has ever had.  It has been going on for a week consistently without relief with Tylenol or rest.  She states that it is worsened by sound.  She denies lacrimation, sensitivity to light, head trauma, loss of consciousness, seizures, dizziness, fever, chills, night sweats, weakness, paresthesias, change in speech/vision/hearing/smell/taste, chest pain, shortness of breath, abdominal pain, nausea/vomiting/diarrhea.  She is currently on Coumadin for recurrent thrombotic events and states she has not missed a dose recently. ? 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 ?Past surgical history significant for peripheral vascular thrombectomy on 03/23/2022 with peripheral vascular balloon angioplasty on 03/24/2021 on the left side. ? ?Home Medications ?Prior to Admission medications   ?Medication Sig Start Date End Date Taking? Authorizing Provider  ?acetaZOLAMIDE (DIAMOX) 125 MG tablet Take 2 tablets (250 mg total) by mouth 2 (two) times daily. ?Patient not  taking: Reported on 06/06/2021 12/02/20   [name], DO  ?acetaZOLAMIDE (DIAMOX) 250 MG tablet Take 500 mg by mouth 2 (two) times daily. ?Patient not taking: Reported on 06/09/2021 05/09/21   [provider]  ?benzonatate (TESSALON) 100 MG capsule Take 1 capsule (100 mg total) by mouth 3 (three) times daily. ?Patient not taking: Reported on 06/06/2021 04/08/21   Lavina Hamman, MD  ?clotrimazole (CLOTRIMAZOLE 3) 2 % vaginal cream Place 1 Applicatorful vaginally at bedtime. 05/11/21   [name], NP  ?docusate sodium (COLACE) 100 MG capsule Take 1 capsule (100 mg total) by mouth at bedtime. 04/08/21   Lavina Hamman, MD  ?ferrous [id number] C-folic acid (TRINSICON / FOLTRIN) capsule Take 1 capsule by mouth 2 (two) times daily after a meal. 04/13/21   [name], NP  ?HYDROcodone-acetaminophen (NORCO/VICODIN) 5-325 MG tablet Take 1-2 tablets by mouth every 4 (four) hours as needed. 05/06/21   [provider]  ?metoprolol succinate (TOPROL-XL) 25 MG 24 hr tablet Take 1 tablet (25 mg total) by mouth daily. 03/27/21   [name], MD  ?polyethylene glycol (MIRALAX / GLYCOLAX) 17 g packet Take 17 g by mouth daily. 04/09/21   Lavina Hamman, MD  ?Prenatal Vit-Fe Fumarate-FA (PRENATAL VITAMIN) 27-0.8 MG TABS Take 1 tablet by mouth daily. 04/19/20   [name], CNM  ?Vitamin D, Ergocalciferol, (DRISDOL) 1.25 MG (50000 UNIT) CAPS capsule Take 50,000 Units by mouth every Wednesday. 02/24/21   [provider]  ?warfarin (COUMADIN) 10 MG tablet TAKE 1 TABLET BY MOUTH AS DIRECTED BY NP/MD 09/22/21   [name], NP  ?   ? ?Allergies    ?Patient has  no known allergies.   ? ?Review of Systems   ?Review of Systems  ?Constitutional:  Negative for chills and fever.  ?HENT:  Negative for congestion, rhinorrhea and sinus pain.   ?Respiratory:  Negative for cough and shortness of breath.   ?Cardiovascular:  Positive for leg swelling. Negative for chest pain and palpitations.  ?      Left lower extremity swelling secondary to 2 peripheral vascular surgeries in September 2022  ?Gastrointestinal:  Negative for abdominal distention, constipation and diarrhea.  ?Genitourinary:  Negative for dysuria, hematuria and urgency.  ?Musculoskeletal:  Negative for neck pain and neck stiffness.  ?Skin:  Negative for color change, pallor, rash and wound.  ?Neurological:  Positive for headaches. Negative for dizziness, seizures, syncope, weakness and numbness.  ? ?Physical Exam ?Updated Vital Signs ?BP 112/79 (BP Location: Right Arm)   Pulse 69   Temp 98.3 ?F (36.8 ?C) (Oral)   Resp 16   SpO2 100%  ?Physical Exam ?Vitals and nursing note reviewed.  ?Constitutional:   ?   General: She is not in acute distress. ?   Appearance: Normal appearance. She is normal weight. She is not ill-appearing, toxic-appearing or diaphoretic.  ?HENT:  ?   Head: Normocephalic and atraumatic.  ?   Right Ear: Tympanic membrane, ear canal and external ear normal.  ?   Left Ear: Tympanic membrane, ear canal and external ear normal.  ?   Nose: Nose normal. No congestion.  ?   Mouth/Throat:  ?   Mouth: Mucous membranes are moist.  ?   Pharynx: Oropharynx is clear. No oropharyngeal exudate or posterior oropharyngeal erythema.  ?Eyes:  ?   General: No visual field deficit. ?   Extraocular Movements: Extraocular movements intact.  ?   Conjunctiva/sclera: Conjunctivae normal.  ?   Pupils: Pupils are equal, round, and reactive to light.  ?Cardiovascular:  ?   Rate and Rhythm: Normal rate and regular rhythm.  ?   Pulses: Normal pulses.  ?   Heart sounds: Normal heart sounds. No murmur heard. ?Pulmonary:  ?   Effort: Pulmonary effort is normal.  ?   Breath sounds: Normal breath sounds.  ?Abdominal:  ?   General: Abdomen is flat. Bowel sounds are normal.  ?   Tenderness: There is no abdominal tenderness. There is no guarding.  ?Musculoskeletal:     ?   General: No tenderness. Normal range of motion.  ?   Cervical back: Normal range of motion  and neck supple.  ?   Left lower leg: Edema present.  ?Skin: ?   General: Skin is warm and dry.  ?   Capillary Refill: Capillary refill takes less than 2 seconds.  ?Neurological:  ?   General: No focal deficit present.  ?   Mental Status: She is alert and oriented to person, place, and time.  ?   Cranial Nerves: No dysarthria or facial asymmetry.  ?   Sensory: Sensation is intact.  ?   Motor: Motor function is intact. No weakness or pronator drift.  ?   Coordination: Coordination is intact. Coordination normal. Finger-Nose-Finger Test and Heel to [facility] Test normal.  ?   Gait: Gait is intact.  ?   Deep Tendon Reflexes: Reflexes are normal and symmetric.  ?   Comments: Cranial nerves III through XII grossly intact.  ?Psychiatric:     ?   Mood and Affect: Mood normal.     ?   Behavior: Behavior normal. Behavior is cooperative.  ? ? ?  ED Results / Procedures / Treatments   ?Labs ?(all labs ordered are listed, but only abnormal results are displayed) ?Labs Reviewed  ?CBC WITH DIFFERENTIAL/PLATELET - Abnormal; Notable for the following components:  ?    Result Value  ? MCH 24.6 (*)   ? MCHC 29.3 (*)   ? RDW 16.2 (*)   ? All other components within normal limits  ?BASIC METABOLIC PANEL  ? ? ?EKG ?None ? ?Radiology ?No results found. ? ?Procedures ?Procedures  ? ? ?Medications Ordered in ED ?Medications - No data to display ? ?ED Course/ Medical Decision Making/ A&P ?  ?                        ?Medical Decision Making ?Amount and/or Complexity of Data Reviewed ?Labs: ordered. ?Radiology: ordered. ? ? ?This patient presents to the ED for concern of headache, this involves an extensive number of treatment options, and is a complaint that carries with it a high risk of complications and morbidity.  The differential diagnosis includes migraine/cluster/tension headache, intracranial bleed, intracranial thrombotic event, head trauma, pseudotumor cerebri, meningitis ? ? ?Co morbidities that complicate the patient  evaluation ? 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 ? ?Additional history obtained: ? ?Addi

## 2021-11-01 NOTE — ED Triage Notes (Signed)
Pt with right sided headache, the same as her typical  ?

## 2021-11-01 NOTE — Discharge Instructions (Addendum)
Take Tylenol as needed for when headaches occur.  Please follow-up with your provider today regarding laboratory studies from today and subtherapeutic Coumadin/warfarin therapy.  Read information packet attached for further symptomatic therapy.  Please do not hesitate to return to the emergency department if worrisome signs and symptoms we discussed become apparent. ?

## 2021-11-08 ENCOUNTER — Inpatient Hospital Stay: Payer: 59 | Attending: Nurse Practitioner

## 2021-11-08 DIAGNOSIS — Z7901 Long term (current) use of anticoagulants: Secondary | ICD-10-CM | POA: Insufficient documentation

## 2021-11-08 DIAGNOSIS — Z86718 Personal history of other venous thrombosis and embolism: Secondary | ICD-10-CM | POA: Insufficient documentation

## 2021-11-12 ENCOUNTER — Other Ambulatory Visit: Payer: Self-pay | Admitting: Nurse Practitioner

## 2021-11-16 ENCOUNTER — Other Ambulatory Visit: Payer: Self-pay

## 2021-11-16 ENCOUNTER — Telehealth: Payer: Self-pay

## 2021-11-16 DIAGNOSIS — R7989 Other specified abnormal findings of blood chemistry: Secondary | ICD-10-CM

## 2021-11-16 NOTE — Telephone Encounter (Signed)
Unable to reach pt by phone or leave a message. Pt sent mychart message and mailed a letter with results and recommendations per Dr. Tomasa Rand. Order in for MRCP. Radiology scheduling to contact pt to schedule MRCP.

## 2021-11-16 NOTE — Telephone Encounter (Signed)
-----   Message from Jenel Lucks, MD sent at 11/15/2021  8:40 AM EDT ----- Bonita Quin,  Can you please reach out to Ms. Dziuba and let her know that her liver enzymes are still elevated and there is not a clear reason why.  Her lab evaluation in the hospital was unrevealing.  I recommend an MRCP to assess for biliary abnormalities (PSC).   Can you please get her scheduled for an MRCP? If she likes she can schedule an office visit to discuss further.  Thanks

## 2021-11-16 NOTE — Telephone Encounter (Signed)
Attempted to reach patient related to scheduling a lab appointment per provider request.  Unable to reach patient and unable to leave a message.  My Chart message was sent.  No further concerns at this time.

## 2021-11-22 ENCOUNTER — Inpatient Hospital Stay: Payer: 59

## 2021-11-22 ENCOUNTER — Other Ambulatory Visit: Payer: Self-pay

## 2021-11-22 DIAGNOSIS — I82220 Acute embolism and thrombosis of inferior vena cava: Secondary | ICD-10-CM

## 2021-11-22 DIAGNOSIS — Z7901 Long term (current) use of anticoagulants: Secondary | ICD-10-CM | POA: Diagnosis not present

## 2021-11-22 DIAGNOSIS — Z86718 Personal history of other venous thrombosis and embolism: Secondary | ICD-10-CM | POA: Diagnosis present

## 2021-11-22 LAB — CBC WITH DIFFERENTIAL (CANCER CENTER ONLY)
Abs Immature Granulocytes: 0.02 10*3/uL (ref 0.00–0.07)
Basophils Absolute: 0.1 10*3/uL (ref 0.0–0.1)
Basophils Relative: 1 %
Eosinophils Absolute: 0.3 10*3/uL (ref 0.0–0.5)
Eosinophils Relative: 6 %
HCT: 37.8 % (ref 36.0–46.0)
Hemoglobin: 11.7 g/dL — ABNORMAL LOW (ref 12.0–15.0)
Immature Granulocytes: 0 %
Lymphocytes Relative: 39 %
Lymphs Abs: 1.8 10*3/uL (ref 0.7–4.0)
MCH: 24.9 pg — ABNORMAL LOW (ref 26.0–34.0)
MCHC: 31 g/dL (ref 30.0–36.0)
MCV: 80.6 fL (ref 80.0–100.0)
Monocytes Absolute: 0.6 10*3/uL (ref 0.1–1.0)
Monocytes Relative: 12 %
Neutro Abs: 2 10*3/uL (ref 1.7–7.7)
Neutrophils Relative %: 42 %
Platelet Count: 158 10*3/uL (ref 150–400)
RBC: 4.69 MIL/uL (ref 3.87–5.11)
RDW: 16.2 % — ABNORMAL HIGH (ref 11.5–15.5)
Smear Review: NORMAL
WBC Count: 4.7 10*3/uL (ref 4.0–10.5)
nRBC: 0 % (ref 0.0–0.2)

## 2021-11-22 LAB — PROTIME-INR
INR: 2.9 — ABNORMAL HIGH (ref 0.8–1.2)
Prothrombin Time: 30 seconds — ABNORMAL HIGH (ref 11.4–15.2)

## 2021-11-23 ENCOUNTER — Other Ambulatory Visit: Payer: Self-pay

## 2021-11-23 NOTE — Patient Instructions (Signed)
Visit Information  [name] was given information about Medicaid Managed Care team care coordination services as a part of their [facility] Medicaid benefit. Andrea Hayes verbally consented to engagement with the [facility] Managed Care team.   If you are experiencing a medical emergency, please call 911 or report to your local emergency department or urgent care.   If you have a non-emergency medical problem during routine business hours, please contact your provider's office and ask to speak with a nurse.   For questions related to your [facility] health plan, please call: [phone] or go here:[email or web address]  If you would like to schedule transportation through your [facility] plan, please call the following number at least 2 days in advance of your appointment: [phone].  You can also use the MTM portal or MTM mobile app to manage your rides. For the portal, please go to [email or web address].  Call the [place] at [phone], at any time, 24 hours a day, 7 days a week. If you are in danger or need immediate medical attention call 911.  If you would like help to quit smoking, call 1-800-QUIT-NOW 737 086 3983) OR Espaol: 1-855-Djelo-Ya (6-606-301-6010) o para ms informacin haga clic aqu or Text READY to 200-400 to register via text  [name] - following are the goals we discussed in your visit today:  Pleas see Patient Goals in the [place] of Care below.  Please see education materials related to today's visit provided by MyChart link.  Patient verbalizes understanding of instructions and care plan provided today and agrees to view in [place]. Active MyChart status and patient understanding of how to access instructions and care plan via MyChart confirmed with patient.     The Managed Medicaid care management team will reach out to the patient again over the next 40 days.   Salvatore Marvel RN, BSN Community Care  Coordinator [place] [facility] Mobile: [phone]   Following is a copy of your plan of care:  Care Plan : [place] of Care  Updates made by Andrea Rise, RN since 11/23/2021 12:00 AM     Problem: Chronic Disease Management and Care Coordination Needs for Deep Vein Thrombosis/stroke management and HTN   Priority: High  Onset Date: 06/06/2021     Long-Range Goal: Development of Plan of Care for Management and Care Coordination Needs (DVT/stroke management and HTN)   Start Date: 06/06/2021  Expected End Date: 01/28/2022  Recent Progress: On track  Priority: High  Note:   Current Barriers:  Knowledge Deficits related to plan of care for management of HTN and deep vein thrombosis, stroke  Care Coordination needs related to Limited education about deep vein thrombosis, anticoagulants, strokes* Chronic Disease Management support and education needs related to HTN and deep vein thrombosis, stroke.   RN CM Clinical Goal(s):  Patient will verbalize understanding of plan for management of HTN and recurrent Deep Vein Thrombosis (DVT), stroke as evidenced by good control of HTN and recurrent DVT verbalize basic understanding of  HTN and deep vein thrombosis, stroke disease process and self health management plan as evidenced by for deep vein thrombosis, stroke to include use of anticoagulants and diet and hypertension take all medications exactly as prescribed and will call provider for medication related questions as evidenced by medication compliance. attend all scheduled medical appointments: weekly labs for INR due to daily coumadin; 12/20/2021 Dr. Cira Rue; 01/09/2022 Dr Metta Clines as evidenced by attending all scheduled appointments demonstrate  Improved and Ongoing adherence to prescribed treatment plan for HTN and deep vein thrombosis, stroke as evidenced by therapeutic INR values continue to work with RN Care Manager to address care management and  care coordination needs related to  HTN and recurrent DVT, stroke prevention as evidenced by adherence to CM Team Scheduled appointments through collaboration with RN Care manager, provider, and care team.   Interventions: Follow up outreaches to review labs, review upcoming appointments, to educate and answer questions Inter-disciplinary care team collaboration (see longitudinal plan of care) Evaluation of current treatment plan related to  self management and patient's adherence to plan as established by provider Follow up with patient to assess for any worsening symptoms related to DVT or care coordination needs Reviewed recent labs, recent blood pressure readings and discussed importance of hydration and it's affect on blood pressure. No reported leg pain, swelling, no headaches Reviewed upcoming appointments with patient.  Deep vein thrombosis, stroke Goal on track:  Yes. Evaluation of current treatment plan related to  deep vein thrombosis, stroke , Limited education about home management, medicine, diet related to deep vein thrombosis, stroke* self-management and patient's adherence to plan as established by provider. Discussed plans with patient for ongoing care management follow up and provided patient with direct contact information for care management team Evaluation of current treatment plan related to deep vein thrombosis, stroke and patient's adherence to plan as established by provider Provided education to patient re: DVT, Stroke, anticoagulants, bleeding risks, INR values with therapeutic range of 2.5-3.5 Reviewed medications with patient and discussed action plan, worsening symptoms to call MD office for any changes Reviewed scheduled/upcoming provider appointments including Discussed plans with patient for ongoing care management follow up and provided patient with direct contact information for care management team Patient continues to have weekly lab work drawn for INR.   Patient currently on Coumadin 10 mg daily and is in therapeutic range of 2.5 - 3.5 Provided continued education on signs & symptoms of DVT: swelling, redness and warmth/heat at site.  Discussed that Left lower leg edema was noted upon ED visit on 11/01/2021.  Patient states she has no edema/swelling of Left lower leg currently.   Hypertension Interventions:  (Status:  Goal on track:  Yes.) Long Term Goal Last practice recorded BP readings:  BP Readings from Last 3 Encounters:  11/01/21 112/60  09/27/21 109/76  08/02/21 103/64    Most recent eGFR/CrCl: No results found for: EGFR  No components found for: CRCL  Evaluation of current treatment plan related to hypertension self management and patient's adherence to plan as established by provider Reviewed medications with patient and discussed importance of compliance Discussed plans with patient for ongoing care management follow up and provided patient with direct contact information for care management team Reviewed scheduled/upcoming provider appointments including:   Reinforced education with patient on the importance of staying hydrated and it's direct affect on blood pressure.  Patient states she drinks "A Lot" of fluids throughout the day.  Patient denies any light headed or dizziness.   Patient verbalized understanding. Discussed recent ED visit for intense headache on 11/01/2021.  Patient was treated and released the same day.  Patient states she has had no further headaches. Discussed recent order for x-ray (MRCP) of abdomen due to continued elevated liver enzymes.  Patient states she contacted radiology yesterday and is awaiting return phone call to make an appointment.  Patient Goals/Self-Care Activities: Take all medications as prescribed Attend all scheduled provider appointments Perform all self  care activities independently  Perform IADL's (shopping, preparing meals, housekeeping, managing finances) independently Call provider  office for new concerns or questions   Follow Up Plan:  The patient has been provided contact information via My Chart link for the care management team and has been advised to call with any health related questions or concerns.

## 2021-11-23 NOTE — Patient Outreach (Signed)
Medicaid Managed Care   Nurse Care Manager Note  11/23/2021 Name:  Andrea Hayes MRN:  [id number] DOB:  Jan 30, 1991  Andrea Hayes is an 31 y.o. year old female who is a primary patient of [name], MD.  The Medicaid Managed Care Coordination team was consulted for assistance with:    HTN DVT/Stroke Management  Andrea Hayes was given information about Medicaid Managed Care Coordination team services today. Andrea Hayes Patient agreed to services and verbal consent obtained.  Engaged with patient by telephone for follow up visit in response to provider referral for case management and/or care coordination services.   Assessments/Interventions:  Review of past medical history, allergies, medications, health status, including review of consultants reports, laboratory and other test data, was performed as part of comprehensive evaluation and provision of chronic care management services.  SDOH (Social Determinants of Health) assessments and interventions performed:   Care Plan  No Known Allergies  Medications Reviewed Today     Reviewed by Inge Rise, RN (Case Manager) on 11/23/21 at [place] List Status: <None>   Medication Order Taking? Sig Documenting Provider Last Dose Status Informant  acetaZOLAMIDE (DIAMOX) 125 MG tablet [id number] No Take 2 tablets (250 mg total) by mouth 2 (two) times daily.  Patient not taking: Reported on 06/06/2021   [name], DO Not Taking Active Self  acetaZOLAMIDE (DIAMOX) 250 MG tablet [id number] No Take 500 mg by mouth 2 (two) times daily.  Patient not taking: Reported on 06/09/2021   [provider] Not Taking Active   benzonatate (TESSALON) 100 MG capsule [id number] No Take 1 capsule (100 mg total) by mouth 3 (three) times daily.  Patient not taking: Reported on 06/06/2021   Lavina Hamman, MD Not Taking Active   clotrimazole (CLOTRIMAZOLE 3) 2 % vaginal cream [id number] No Place 1 Applicatorful vaginally at bedtime. [name], NP Taking Active   docusate sodium (COLACE) 100 MG capsule [id number] No Take 1 capsule (100 mg total) by mouth at bedtime. Lavina Hamman, MD Taking Active   ferrous [id number] C-folic acid (TRINSICON / FOLTRIN) capsule [id number] No Take 1 capsule by mouth 2 (two) times daily after a meal. [name], NP Taking Active   HYDROcodone-acetaminophen (NORCO/VICODIN) 5-325 MG tablet [id number] No Take 1-2 tablets by mouth every 4 (four) hours as needed. [provider] Taking Active   metoprolol succinate (TOPROL-XL) 25 MG 24 hr tablet [id number] No Take 1 tablet (25 mg total) by mouth daily. [name], MD Taking Active Self  polyethylene glycol (MIRALAX / GLYCOLAX) 17 g packet [id number] No Take 17 g by mouth daily. Lavina Hamman, MD Taking Active   Prenatal Vit-Fe Fumarate-FA (PRENATAL VITAMIN) 27-0.8 MG TABS [id number] No Take 1 tablet by mouth daily. [name], CNM Taking Active   Vitamin D, Ergocalciferol, (DRISDOL) 1.25 MG (50000 UNIT) CAPS capsule [id number] No Take 50,000 Units by mouth every Wednesday. 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   Acute respiratory failure with hypoxia (HCC) 01/26/2021   Chronic right arterial ischemic stroke, MCA (middle cerebral artery) 01/23/2021   Pulmonary embolism ([place]) 01/23/2021   Leukocytosis 01/23/2021   Thrombocytopenia ([place]) 01/23/2021   Elevated liver enzymes 01/23/2021   Chest pain 01/23/2021   Back pain 01/23/2021   Vaginal delivery 12/05/2020    Headache 11/28/2020   HTN (hypertension) 11/28/2020   Severe preeclampsia, delivered 11/04/2020   IUGR (intrauterine growth restriction) affecting care of mother 10/31/2020   Thrombocytopenia affecting pregnancy ([place]) 05/27/2020   Acute right arterial ischemic stroke, middle cerebral artery (MCA) (HCC)    Dural venous sinus thrombosis    Idiopathic intracranial hypertension    Cerebral venous thrombosis 07/24/2018    Conditions to be addressed/monitored per PCP order:  HTN and DVT/Stroke Management  Care Plan : RN Care Manager Plan of Care  Updates made by Inge Rise, RN since 11/23/2021 12:00 AM     Problem: Chronic Disease Management and Care Coordination Needs for Deep Vein Thrombosis/stroke management and HTN   Priority: High  Onset Date: 06/06/2021     Long-Range Goal: Development of Plan of Care for Management and Care Coordination Needs (DVT/stroke management and HTN)   Start Date: 06/06/2021  Expected End Date: 01/28/2022  Recent Progress: On track  Priority: High  Note:   Current Barriers:  Knowledge Deficits related to plan of care for management of HTN and deep vein thrombosis, stroke  Care Coordination needs related to Limited education about deep vein thrombosis, anticoagulants, strokes* Chronic Disease Management support and education needs related to HTN and deep vein thrombosis, stroke.   RN CM Clinical Goal(s):  Patient will verbalize understanding of plan for management of HTN and recurrent Deep Vein Thrombosis (DVT), stroke as evidenced by good control of HTN and recurrent DVT verbalize basic understanding of  HTN and deep vein thrombosis, stroke disease process and self health management plan as evidenced by for deep vein thrombosis, stroke to include use of anticoagulants and diet and hypertension take all medications exactly as prescribed and will call provider for medication related questions as evidenced by medication compliance. attend all scheduled  medical appointments: weekly labs for INR due to daily coumadin; 12/20/2021 Dr. Cira Rue; 01/09/2022 Dr Metta Clines as evidenced by attending all scheduled appointments demonstrate Improved and Ongoing adherence to prescribed treatment plan for HTN and deep vein thrombosis, stroke as evidenced by therapeutic INR values continue to work with RN Care Manager to address care management and care coordination needs related to  HTN and recurrent DVT, stroke prevention as evidenced by adherence to CM Team Scheduled appointments through collaboration with RN Care manager, provider, and care team.   Interventions: Follow up outreaches to review labs, review upcoming appointments, to educate and answer questions Inter-disciplinary care team collaboration (see longitudinal plan of care) Evaluation of current treatment plan related to  self management and patient's adherence to plan as established by provider Follow up with patient to assess for any worsening symptoms related to DVT or care coordination needs Reviewed recent labs, recent blood pressure readings and discussed importance of hydration and it's affect on blood pressure. No reported leg pain, swelling, no headaches Reviewed upcoming appointments with patient.  Deep vein thrombosis, stroke Goal on track:  Yes. Evaluation of current treatment plan related to  deep vein thrombosis, stroke , Limited education about home management, medicine, diet related to deep vein thrombosis, stroke* self-management and patient's adherence to plan as established by provider. Discussed  plans with patient for ongoing care management follow up and provided patient with direct contact information for care management team Evaluation of current treatment plan related to deep vein thrombosis, stroke and patient's adherence to plan as established by provider Provided education to patient re: DVT, Stroke, anticoagulants, bleeding risks, INR values with therapeutic range of  2.5-3.5 Reviewed medications with patient and discussed action plan, worsening symptoms to call MD office for any changes Reviewed scheduled/upcoming provider appointments including Discussed plans with patient for ongoing care management follow up and provided patient with direct contact information for care management team Patient continues to have weekly lab work drawn for INR.  Patient currently on Coumadin 10 mg daily and is in therapeutic range of 2.5 - 3.5 Provided continued education on signs & symptoms of DVT: swelling, redness and warmth/heat at site.  Discussed that Left lower leg edema was noted upon ED visit on 11/01/2021.  Patient states she has no edema/swelling of Left lower leg currently.   Hypertension Interventions:  (Status:  Goal on track:  Yes.) Long Term Goal Last practice recorded BP readings:  BP Readings from Last 3 Encounters:  11/01/21 112/60  09/27/21 109/76  08/02/21 103/64    Most recent eGFR/CrCl: No results found for: EGFR  No components found for: CRCL  Evaluation of current treatment plan related to hypertension self management and patient's adherence to plan as established by provider Reviewed medications with patient and discussed importance of compliance Discussed plans with patient for ongoing care management follow up and provided patient with direct contact information for care management team Reviewed scheduled/upcoming provider appointments including:   Reinforced education with patient on the importance of staying hydrated and it's direct affect on blood pressure.  Patient states she drinks "A Lot" of fluids throughout the day.  Patient denies any light headed or dizziness.   Patient verbalized understanding. Discussed recent ED visit for intense headache on 11/01/2021.  Patient was treated and released the same day.  Patient states she has had no further headaches. Discussed recent order for x-ray (MRCP) of abdomen due to continued elevated liver  enzymes.  Patient states she contacted radiology yesterday and is awaiting return phone call to make an appointment.  Patient Goals/Self-Care Activities: Take all medications as prescribed Attend all scheduled provider appointments Perform all self care activities independently  Perform IADL's (shopping, preparing meals, housekeeping, managing finances) independently Call provider office for new concerns or questions   Follow Up Plan:  The patient has been provided contact information via My Chart link for the care management team and has been advised to call with any health related questions or concerns.        Follow Up:  Patient agrees to Care Plan and Follow-up.  Plan: The Managed Medicaid care management team will reach out to the patient again over the next 40 days.  Date/time of next scheduled RN care management/care coordination outreach:  December 28, 2021 at 11:00 [place], [place] [facility] Mobile: [phone]

## 2021-12-06 ENCOUNTER — Inpatient Hospital Stay: Payer: Medicaid Other | Attending: Nurse Practitioner

## 2021-12-06 DIAGNOSIS — I82509 Chronic embolism and thrombosis of unspecified deep veins of unspecified lower extremity: Secondary | ICD-10-CM | POA: Insufficient documentation

## 2021-12-06 DIAGNOSIS — Z7901 Long term (current) use of anticoagulants: Secondary | ICD-10-CM | POA: Insufficient documentation

## 2021-12-06 DIAGNOSIS — R600 Localized edema: Secondary | ICD-10-CM | POA: Insufficient documentation

## 2021-12-06 DIAGNOSIS — Z8673 Personal history of transient ischemic attack (TIA), and cerebral infarction without residual deficits: Secondary | ICD-10-CM | POA: Insufficient documentation

## 2021-12-07 ENCOUNTER — Other Ambulatory Visit: Payer: Self-pay | Admitting: Nurse Practitioner

## 2021-12-20 ENCOUNTER — Encounter: Payer: Self-pay | Admitting: Nurse Practitioner

## 2021-12-20 ENCOUNTER — Inpatient Hospital Stay: Payer: Medicaid Other

## 2021-12-20 ENCOUNTER — Inpatient Hospital Stay (HOSPITAL_BASED_OUTPATIENT_CLINIC_OR_DEPARTMENT_OTHER): Payer: Medicaid Other | Admitting: Nurse Practitioner

## 2021-12-20 ENCOUNTER — Other Ambulatory Visit: Payer: Self-pay

## 2021-12-20 ENCOUNTER — Other Ambulatory Visit: Payer: Self-pay | Admitting: Nurse Practitioner

## 2021-12-20 VITALS — BP 106/71 | HR 65 | Temp 98.0°F | Resp 18 | Wt 150.1 lb

## 2021-12-20 DIAGNOSIS — Z86718 Personal history of other venous thrombosis and embolism: Secondary | ICD-10-CM

## 2021-12-20 DIAGNOSIS — I82509 Chronic embolism and thrombosis of unspecified deep veins of unspecified lower extremity: Secondary | ICD-10-CM | POA: Diagnosis present

## 2021-12-20 DIAGNOSIS — R7401 Elevation of levels of liver transaminase levels: Secondary | ICD-10-CM

## 2021-12-20 DIAGNOSIS — Z8673 Personal history of transient ischemic attack (TIA), and cerebral infarction without residual deficits: Secondary | ICD-10-CM | POA: Diagnosis not present

## 2021-12-20 DIAGNOSIS — I82220 Acute embolism and thrombosis of inferior vena cava: Secondary | ICD-10-CM

## 2021-12-20 DIAGNOSIS — Z7901 Long term (current) use of anticoagulants: Secondary | ICD-10-CM | POA: Diagnosis not present

## 2021-12-20 DIAGNOSIS — R600 Localized edema: Secondary | ICD-10-CM | POA: Diagnosis not present

## 2021-12-20 LAB — CBC WITH DIFFERENTIAL (CANCER CENTER ONLY)
Abs Immature Granulocytes: 0.01 10*3/uL (ref 0.00–0.07)
Basophils Absolute: 0.1 10*3/uL (ref 0.0–0.1)
Basophils Relative: 1 %
Eosinophils Absolute: 0 10*3/uL (ref 0.0–0.5)
Eosinophils Relative: 1 %
HCT: 40 % (ref 36.0–46.0)
Hemoglobin: 12.4 g/dL (ref 12.0–15.0)
Immature Granulocytes: 0 %
Lymphocytes Relative: 31 %
Lymphs Abs: 1.6 10*3/uL (ref 0.7–4.0)
MCH: 25.1 pg — ABNORMAL LOW (ref 26.0–34.0)
MCHC: 31 g/dL (ref 30.0–36.0)
MCV: 81 fL (ref 80.0–100.0)
Monocytes Absolute: 0.5 10*3/uL (ref 0.1–1.0)
Monocytes Relative: 11 %
Neutro Abs: 2.8 10*3/uL (ref 1.7–7.7)
Neutrophils Relative %: 56 %
Platelet Count: 301 10*3/uL (ref 150–400)
RBC: 4.94 MIL/uL (ref 3.87–5.11)
RDW: 17.8 % — ABNORMAL HIGH (ref 11.5–15.5)
WBC Count: 5 10*3/uL (ref 4.0–10.5)
nRBC: 0 % (ref 0.0–0.2)

## 2021-12-20 LAB — CMP (CANCER CENTER ONLY)
ALT: 37 U/L (ref 0–44)
AST: 21 U/L (ref 15–41)
Albumin: 4.1 g/dL (ref 3.5–5.0)
Alkaline Phosphatase: 107 U/L (ref 38–126)
Anion gap: 6 (ref 5–15)
BUN: 8 mg/dL (ref 6–20)
CO2: 26 mmol/L (ref 22–32)
Calcium: 9.3 mg/dL (ref 8.9–10.3)
Chloride: 104 mmol/L (ref 98–111)
Creatinine: 0.63 mg/dL (ref 0.44–1.00)
GFR, Estimated: >60 mL/min (ref >60)
Glucose, Bld: 97 mg/dL (ref 70–99)
Potassium: 3.5 mmol/L (ref 3.5–5.1)
Sodium: 136 mmol/L (ref 135–145)
Total Bilirubin: 0.3 mg/dL (ref 0.3–1.2)
Total Protein: 7.8 g/dL (ref 6.5–8.1)

## 2021-12-20 LAB — PREGNANCY, URINE: Preg Test, Ur: NEGATIVE

## 2021-12-20 LAB — PROTIME-INR
INR: 1.8 — ABNORMAL HIGH (ref 0.8–1.2)
Prothrombin Time: 20.9 seconds — ABNORMAL HIGH (ref 11.4–15.2)

## 2021-12-20 LAB — HCG, SERUM, QUALITATIVE: Preg, Serum: NEGATIVE

## 2021-12-20 NOTE — Progress Notes (Signed)
[place]   [phone] [phone]   Clinic Follow up Note   Patient Care Team: [name], MD as PCP - General (Internal Medicine) [name], DO as Consulting Physician (Neurology) Cherre Robins, MD as Consulting Physician (Vascular Surgery) Inge Rise, RN (Inactive) as Case Manager 12/20/2021  CHIEF COMPLAINT: Follow up h/o thrombosis   CURRENT THERAPY: Coumadin 10 mg po once daily  INTERVAL HISTORY: Andrea Hayes returns for follow up as scheduled. Last seen by me 09/27/21. She had an episode of subtherapeutic INR to 1.2 on 11/01/21 while in the ED for a migraine but has since been therapeutic.  She continues 10 mg p.o. once daily, denies missed doses or diet change.  She has recently started taking allergy pill intermittently prescribed by her PCP.  She had pain in the left arm and applied clotrimazole, symptoms resolved.  She continues to have left greater than right leg edema after working a long day, resolves overnight.  Denies calf pain, cough, chest pain, dyspnea, or bleeding on Coumadin.  She notes her period is 1 month late, she is sexually active without contraception.  She notes her abdomen is itching.  All other systems were reviewed with the patient and are negative.  MEDICAL HISTORY:  Past Medical History:  Diagnosis Date   Stroke [facility])     SURGICAL HISTORY: Past Surgical History:  Procedure Laterality Date   BIOPSY  04/05/2021   Procedure: BIOPSY;  Surgeon: Daryel November, MD;  Location: [facility] ENDOSCOPY;  Service: Gastroenterology;;   COLONOSCOPY N/A 04/05/2021   Procedure: COLONOSCOPY;  Surgeon: Daryel November, MD;  Location: [facility] ENDOSCOPY;  Service: Gastroenterology;  Laterality: N/A;   ESOPHAGOGASTRODUODENOSCOPY N/A 04/05/2021   Procedure: ESOPHAGOGASTRODUODENOSCOPY (EGD);  Surgeon: Daryel November, MD;  Location: [place];  Service: Gastroenterology;  Laterality: N/A;   NO PAST SURGERIES      PERIPHERAL VASCULAR BALLOON ANGIOPLASTY  03/24/2021   Procedure: PERIPHERAL VASCULAR BALLOON ANGIOPLASTY;  Surgeon: Cherre Robins, MD;  Location: [place] CV LAB;  Service: Cardiovascular;;  bilateral common iliacs   PERIPHERAL VASCULAR THROMBECTOMY N/A 03/23/2021   Procedure: PERIPHERAL VASCULAR THROMBECTOMY;  Surgeon: Waynetta Sandy, MD;  Location: [place] CV LAB;  Service: Cardiovascular;  Laterality: N/A;   PERIPHERAL VASCULAR THROMBECTOMY N/A 03/24/2021   Procedure: LYSIS RECHECK;  Surgeon: Cherre Robins, MD;  Location: [place] CV LAB;  Service: Cardiovascular;  Laterality: N/A;    I have reviewed the social history and family history with the patient and they are unchanged from previous note.  ALLERGIES:  has No Known Allergies.  MEDICATIONS:  Current Outpatient Medications  Medication Sig Dispense Refill   acetaZOLAMIDE (DIAMOX) 125 MG tablet Take 2 tablets (250 mg total) by mouth 2 (two) times daily. (Patient not taking: Reported on 06/06/2021) 120 tablet 5   acetaZOLAMIDE (DIAMOX) 250 MG tablet Take 500 mg by mouth 2 (two) times daily. (Patient not taking: Reported on 06/09/2021)     benzonatate (TESSALON) 100 MG capsule Take 1 capsule (100 mg total) by mouth 3 (three) times daily. (Patient not taking: Reported on 06/06/2021) 20 capsule 0   clotrimazole (CLOTRIMAZOLE 3) 2 % vaginal cream Place 1 Applicatorful vaginally at bedtime. 21 g 0   docusate sodium (COLACE) 100 MG capsule Take 1 capsule (100 mg total) by mouth at bedtime. 10 capsule 0   ferrous [id number] C-folic acid (TRINSICON / FOLTRIN) capsule Take 1 capsule by mouth 2 (two) times daily after a meal.  180 capsule 0   HYDROcodone-acetaminophen (NORCO/VICODIN) 5-325 MG tablet Take 1-2 tablets by mouth every 4 (four) hours as needed.     metoprolol succinate (TOPROL-XL) 25 MG 24 hr tablet Take 1 tablet (25 mg total) by mouth daily. 30 tablet 1   polyethylene glycol (MIRALAX / GLYCOLAX) 17 g  packet Take 17 g by mouth daily. 14 each 0   Prenatal Vit-Fe Fumarate-FA (PRENATAL VITAMIN) 27-0.8 MG TABS Take 1 tablet by mouth daily. 30 tablet 12   Vitamin D, Ergocalciferol, (DRISDOL) 1.25 MG (50000 UNIT) CAPS capsule Take 50,000 Units by mouth every Wednesday.     warfarin (COUMADIN) 10 MG tablet TAKE 1 TABLET BY MOUTH AS DIRECTED BY NP/MD 30 tablet 1   No current facility-administered medications for this visit.    PHYSICAL EXAMINATION: ECOG PERFORMANCE STATUS: 0 - Asymptomatic  Vitals:   12/20/21 0909  BP: 106/71  Pulse: 65  Resp: 18  Temp: 98 F (36.7 C)  SpO2: 100%   Filed Weights   12/20/21 0909  Weight: 150 lb 1 oz (68.1 kg)    GENERAL:alert, no distress and comfortable SKIN: No rash EYES: sclera clear LUNGS: clear with normal breathing effort HEART: regular rate & rhythm, trace bilateral L>R leg edema NEURO: alert & oriented x 3 with fluent speech, no focal motor/sensory deficits   LABORATORY DATA:  I have reviewed the data as listed    Latest Ref Rng & Units 12/20/2021    9:02 AM 11/22/2021    8:40 AM 11/01/2021    2:12 AM  CBC  WBC 4.0 - 10.5 K/uL 5.0  4.7  7.5   Hemoglobin 12.0 - 15.0 g/dL 60.7  37.1  06.2   Hematocrit 36.0 - 46.0 % 40.0  37.8  42.7   Platelets 150 - 400 K/uL 301  158  161         Latest Ref Rng & Units 12/20/2021    9:02 AM 11/01/2021    2:12 AM 10/11/2021    8:49 AM  CMP  Glucose 70 - 99 mg/dL 97  88  89   BUN 6 - 20 mg/dL 8  6  7    Creatinine 0.44 - 1.00 mg/dL  6.94  8.54   Sodium 135 - 145 mmol/L 136  137  138   Potassium 3.5 - 5.1 mmol/L 3.5  4.0  4.2   Chloride 98 - 111 mmol/L 104  102  104   CO2 22 - 32 mmol/L 26  27  28    Calcium 8.9 - 10.3 mg/dL 9.3  9.7  9.3   Total Protein 6.5 - 8.1 g/dL 7.8   8.4   Total Bilirubin 0.3 - 1.2 mg/dL 0.3   0.3   Alkaline Phos 38 - 126 U/L 107   293   AST 15 - 41 U/L 21   102   ALT 0 - 44 U/L 37   365       RADIOGRAPHIC STUDIES: I have personally reviewed the radiological  images as listed and agreed with the findings in the report. No results found.   ASSESSMENT & PLAN: 31 year old female     1.  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  -Most recently she developed acute lower extremity DVT on Xarelto, concerning for Xarelto failure.  Xarelto was stopped and she was treated with heparin in the hospital, and transition to Coumadin - discharged home on Coumadin 7.5 mg daily and Lovenox bridge, goal INR 2.5-3.5 -she reached therapeutic INR x2 on coumadin and lovenox was discontinued. She is on 7.5 mg daily except 10 mg Wednesday and Friday.  INR on 05/11/2021 was 3.6, she was changed to 7.5 mg daily except 10 mg on Wednesdays -INR became subtherapeutic on doxycycline for skin infection, has been difficult to maintain therapeutic window again. She has required lovenox briefly but does not like to do shots  -currently on coumadin 10 mg po once daily. INR fluctuates somewhat but overall stable in goal range -Ms. Schaab appears stable, no signs of recurrent thrombosis. She reportedly takes coumadin as prescribed, INR subtherapeutic today 1.8; she recently started allergy medication but does not know which one.  -Menstrual period is 1 month late, blood and urine pregnancy test are negative.  We reviewed the pregnancy risk on Coumadin, I encouraged her to use contraception.  She understands -labs  reviewed, CBC is unremarkable. CMP normalized.  -I recommend to take 15 mg Coumadin today then resume normal dosing 10 mg p.o. once daily -Lab every 2 weeks x6 -Follow-up in 12 weeks     PLAN: -lab today, blood/urine pregnancy tests are negative; reviewed precautions on coumadin -continue coumadin, take 15 mg today, then resume 10 mg daily dose -lab q2 weeks x6 -f/up in 12 weeks     Orders Placed This Encounter  Procedures   Pregnancy, urine    Standing Status:   Future    Number of Occurrences:   1    Standing Expiration Date:   12/20/2022   HCG, serum qualitative    Standing Status:   Future    Number of Occurrences:   1    Standing Expiration Date:   12/21/2022   All questions were answered. The patient knows to call the clinic with any problems, questions or concerns. No barriers to learning was detected. I spent 20 minutes counseling the patient face to face. The total time spent in the appointment was 30 minutes and more than 50% was on counseling and review of test results     Andrea Samples, NP 12/20/21

## 2021-12-28 ENCOUNTER — Ambulatory Visit: Payer: Self-pay

## 2022-01-03 ENCOUNTER — Inpatient Hospital Stay: Payer: Medicaid Other | Attending: Nurse Practitioner

## 2022-01-03 DIAGNOSIS — I82501 Chronic embolism and thrombosis of unspecified deep veins of right lower extremity: Secondary | ICD-10-CM | POA: Insufficient documentation

## 2022-01-03 DIAGNOSIS — Z8673 Personal history of transient ischemic attack (TIA), and cerebral infarction without residual deficits: Secondary | ICD-10-CM | POA: Insufficient documentation

## 2022-01-03 DIAGNOSIS — Z79899 Other long term (current) drug therapy: Secondary | ICD-10-CM | POA: Insufficient documentation

## 2022-01-03 DIAGNOSIS — Z86718 Personal history of other venous thrombosis and embolism: Secondary | ICD-10-CM | POA: Insufficient documentation

## 2022-01-03 DIAGNOSIS — Z7901 Long term (current) use of anticoagulants: Secondary | ICD-10-CM | POA: Insufficient documentation

## 2022-01-03 DIAGNOSIS — I82509 Chronic embolism and thrombosis of unspecified deep veins of unspecified lower extremity: Secondary | ICD-10-CM | POA: Insufficient documentation

## 2022-01-04 ENCOUNTER — Other Ambulatory Visit: Payer: Self-pay | Admitting: Nurse Practitioner

## 2022-01-08 NOTE — Progress Notes (Unsigned)
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  If exam reveals recurrence of papilledema, will restart acetazolamide 2  Follow up 6 months.   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   UPDATE: Currently not on acetazolamide.  Has not had an eye exam since last visit.  Denies headache, visual changes or pulsatile tinnitus.     HISTORY: She began experiencing blurred vision and intermittent headaches on top of head.  In October.  She also reports visual obscurations as well.  No pulsatile tinnitus.  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  The next day she went to the ED for further evaluation and treatment.  MRI of brain with and without contrast was personally reviewed and demonstrated tortuous optic nerves with prominent optic nerve sheath fluid which may be seen in setting of intracranial hypertension.  Neurology was consulted and LP was recommended.  However, she declined the procedure and left against medical advice.    She followed up with me in January 2020 and underwent workup for intracranial hypertension.  Lumbar puncture from 07/09/18 demonstrated an opening pressure of 47 cm water with closing pressure of 16 cm water.  CSF cell count, glucose, protein and culture were normal.  She was started on Diamox 500mg  twice daily.  MRV of head from 07/12/18 was personally reviewed and was normal with no evidence of thrombosis or stenosis.  She presented to [facility] on [facility]18 for severe headache with nausea,  vomiting and photophobia.  CT of head was personally reviewed and showed dural venous sinus thrombosis involving the superior sagittal sinus, straight sinus, right transverse sinus and sigmoid sinus and smaller branching veins.  Follow up CTV of head confirmed acute venous sinus thrombosis from superior sagittal sinus to right sigmoid sinus and within intracerebral veins.  Hypercoagulable workup was unremarkable, including homocystein 4.8, antithrombin III mildly elevated at 128, low protein S activity of 22, total protein S 109, protein C activity 180, total protein C 127, lupus anticoagulant 25.3, DRVVT 44.7, beta-2-glycoprotein antibodies <9, factor V leiden mutation negative, prothrombin gene mutation negative, cardiolipin antibodies negative, sickle cell screen negative and negative for pregnancy.  She was started on lovenox and bridged to warfarin.  She was discharged on Fioricet for her headaches.  She was started on topiramate 50mg  twice daily.  Headache never improved and returned to the ED on 08/04/18. INR was therapeutic at 2.43. CT personally reviewed and showed subacute dural venous sinus thrombosis and intracerebral veins with partial recanalization and no acute component.  She received IV Reglan and Benadryl and headache resolved.  Headaches subsequently subsided, so she self-discontinued medications - warfarin, acetazolamide and topiramate, and lost to follow up.  She became pregnant in late 2021.  Started having headaches and pulsatile tinnitus again.  MRV of head without contrast on 05/24/2020 showed worsened segmental superior sagittal sinus thrombosis (compared to CTV 08/05/18) and flow in both transverse and sigmoid sinuses but with chronic scarring of right transverse sinus region, likely sequelae of previous thrombosis/stroke but cannot rule out active/recent thrombosis.  Given  that she had prematurely stopped warfarin, we took precaution and had her on Lovenox during her pregnancy.  She was also  restarted on acetazolamide 500mg  twice daily.  She was hospitalized for headache on 08/12/2020.  Repeat MRI/MRV of head without contrast at that time were stable.  Headaches are improved.  Denies visual obscurations, blurred vision or pulsatile tinnitus.  She stopped taking the acetazolamide regularly. Underwent induction of labor at 37 weeks on 11/04/2020 and gave birth to a girl.  Baby is healthy. Patient is nursing.  She presented to the hospital on 11/28/2020 for severe headaches  MRI and MRV of brain with and without contrast was negative for acute stroke/bleed or acute venous thrombosis.  She was restarted on acetazolamide 250mg  twice daily.    PAST MEDICAL HISTORY: Past Medical History:  Diagnosis Date   Stroke [facility])     MEDICATIONS: Current Outpatient Medications on File Prior to Visit  Medication Sig Dispense Refill   acetaZOLAMIDE (DIAMOX) 125 MG tablet Take 2 tablets (250 mg total) by mouth 2 (two) times daily. (Patient not taking: Reported on 06/06/2021) 120 tablet 5   acetaZOLAMIDE (DIAMOX) 250 MG tablet Take 500 mg by mouth 2 (two) times daily. (Patient not taking: Reported on 06/09/2021)     benzonatate (TESSALON) 100 MG capsule Take 1 capsule (100 mg total) by mouth 3 (three) times daily. (Patient not taking: Reported on 06/06/2021) 20 capsule 0   clotrimazole (CLOTRIMAZOLE 3) 2 % vaginal cream Place 1 Applicatorful vaginally at bedtime. 21 g 0   docusate sodium (COLACE) 100 MG capsule Take 1 capsule (100 mg total) by mouth at bedtime. 10 capsule 0   ferrous fumarate-b12-vitamic C-folic acid (TRINSICON / FOLTRIN) capsule Take 1 capsule by mouth 2 (two) times daily after a meal. 180 capsule 0   HYDROcodone-acetaminophen (NORCO/VICODIN) 5-325 MG tablet Take 1-2 tablets by mouth every 4 (four) hours as needed.     metoprolol succinate (TOPROL-XL) 25 MG 24 hr tablet Take 1 tablet (25 mg total) by mouth daily. 30 tablet 1   polyethylene glycol (MIRALAX / GLYCOLAX) 17 g packet Take 17 g by  mouth daily. 14 each 0   Prenatal Vit-Fe Fumarate-FA (PRENATAL VITAMIN) 27-0.8 MG TABS Take 1 tablet by mouth daily. 30 tablet 12   Vitamin D, Ergocalciferol, (DRISDOL) 1.25 MG (50000 UNIT) CAPS capsule Take 50,000 Units by mouth every Wednesday.     warfarin (COUMADIN) 10 MG tablet TAKE 1 TABLET BY MOUTH AS DIRECTED BY NP/MD 30 tablet 1   No current facility-administered medications on file prior to visit.    ALLERGIES: No Known Allergies  FAMILY HISTORY: Family History  Problem Relation Age of Onset   Asthma Father    Obesity Neg Hx    Hypertension Neg Hx    Diabetes Neg Hx    Cancer Neg Hx       Objective:  Blood pressure 98/65, pulse 75, height 5\' 5"  (1.651 m), weight 143 lb (64.9 kg), SpO2 99 %, not currently breastfeeding. General: No acute distress.  Patient appears well-groomed.   Head:  Normocephalic/atraumatic Eyes:  Fundi examined but not visualized Neck: supple, no paraspinal tenderness, full range of motion Heart:  Regular rate and rhythm Neurological Exam: alert and oriented to person, place, and time.  Speech fluent and not dysarthric, language intact.  CN II-XII intact. Bulk and tone normal, muscle strength 5/5 throughout.  Sensation to light touch intact.  Deep tendon reflexes 2+ throughout, toes downgoing.  Finger to nose testing intact.  Gait normal, Romberg negative.   Shon Millet, DO  CC: Fleet Contras, MD

## 2022-01-09 ENCOUNTER — Ambulatory Visit (INDEPENDENT_AMBULATORY_CARE_PROVIDER_SITE_OTHER): Payer: Medicaid Other | Admitting: Neurology

## 2022-01-09 ENCOUNTER — Encounter: Payer: Self-pay | Admitting: Neurology

## 2022-01-09 VITALS — BP 98/65 | HR 75 | Ht 65.0 in | Wt 143.0 lb

## 2022-01-09 DIAGNOSIS — Z86718 Personal history of other venous thrombosis and embolism: Secondary | ICD-10-CM | POA: Diagnosis not present

## 2022-01-09 DIAGNOSIS — G932 Benign intracranial hypertension: Secondary | ICD-10-CM | POA: Diagnosis not present

## 2022-01-09 NOTE — Patient Instructions (Signed)
I will refer you to the eye doctor.  Let me know if you do not hear from anyone in one week to schedule an appointment.  Otherwise, follow up 6 months.

## 2022-01-17 ENCOUNTER — Inpatient Hospital Stay: Payer: Medicaid Other

## 2022-01-17 ENCOUNTER — Other Ambulatory Visit: Payer: Self-pay

## 2022-01-17 DIAGNOSIS — I82220 Acute embolism and thrombosis of inferior vena cava: Secondary | ICD-10-CM

## 2022-01-17 DIAGNOSIS — Z7901 Long term (current) use of anticoagulants: Secondary | ICD-10-CM | POA: Diagnosis not present

## 2022-01-17 DIAGNOSIS — Z79899 Other long term (current) drug therapy: Secondary | ICD-10-CM | POA: Diagnosis not present

## 2022-01-17 DIAGNOSIS — I82509 Chronic embolism and thrombosis of unspecified deep veins of unspecified lower extremity: Secondary | ICD-10-CM | POA: Diagnosis present

## 2022-01-17 DIAGNOSIS — Z86718 Personal history of other venous thrombosis and embolism: Secondary | ICD-10-CM | POA: Diagnosis not present

## 2022-01-17 DIAGNOSIS — Z8673 Personal history of transient ischemic attack (TIA), and cerebral infarction without residual deficits: Secondary | ICD-10-CM | POA: Diagnosis not present

## 2022-01-17 LAB — CBC WITH DIFFERENTIAL (CANCER CENTER ONLY)
Abs Immature Granulocytes: 0.02 10*3/uL (ref 0.00–0.07)
Basophils Absolute: 0.1 10*3/uL (ref 0.0–0.1)
Basophils Relative: 1 %
Eosinophils Absolute: 0.4 10*3/uL (ref 0.0–0.5)
Eosinophils Relative: 6 %
HCT: 41 % (ref 36.0–46.0)
Hemoglobin: 12.9 g/dL (ref 12.0–15.0)
Immature Granulocytes: 0 %
Lymphocytes Relative: 26 %
Lymphs Abs: 1.6 10*3/uL (ref 0.7–4.0)
MCH: 25.2 pg — ABNORMAL LOW (ref 26.0–34.0)
MCHC: 31.5 g/dL (ref 30.0–36.0)
MCV: 80.2 fL (ref 80.0–100.0)
Monocytes Absolute: 0.7 10*3/uL (ref 0.1–1.0)
Monocytes Relative: 11 %
Neutro Abs: 3.3 10*3/uL (ref 1.7–7.7)
Neutrophils Relative %: 56 %
Platelet Count: 357 10*3/uL (ref 150–400)
RBC: 5.11 MIL/uL (ref 3.87–5.11)
RDW: 17 % — ABNORMAL HIGH (ref 11.5–15.5)
WBC Count: 6 10*3/uL (ref 4.0–10.5)
nRBC: 0 % (ref 0.0–0.2)

## 2022-01-17 LAB — PROTIME-INR
INR: 2.2 — ABNORMAL HIGH (ref 0.8–1.2)
Prothrombin Time: 24 seconds — ABNORMAL HIGH (ref 11.4–15.2)

## 2022-01-18 ENCOUNTER — Telehealth: Payer: Self-pay

## 2022-01-18 NOTE — Telephone Encounter (Signed)
This nurse attempted to reach patient to provide lab results and provider recommendations mentioned below.  There is no answer and this nurse unable to leave a message.  No concerns at this time.

## 2022-01-18 NOTE — Telephone Encounter (Signed)
-----   Message from Pollyann Samples, NP sent at 01/17/2022 11:04 PM EDT ----- Please call pt, she is slightly below goal, please take 15 mg Thursday then back to 10 mg po once daily.  Thanks, Clayborn Heron, NP

## 2022-01-20 ENCOUNTER — Encounter (HOSPITAL_COMMUNITY): Payer: Self-pay | Admitting: Emergency Medicine

## 2022-01-20 ENCOUNTER — Inpatient Hospital Stay (HOSPITAL_COMMUNITY)
Admission: EM | Admit: 2022-01-20 | Discharge: 2022-01-22 | DRG: 300 | Disposition: A | Discharge status: Home / Self Care | Payer: Medicaid Other | Attending: Family Medicine | Admitting: Family Medicine

## 2022-01-20 ENCOUNTER — Emergency Department (HOSPITAL_BASED_OUTPATIENT_CLINIC_OR_DEPARTMENT_OTHER): Payer: Medicaid Other

## 2022-01-20 DIAGNOSIS — M7989 Other specified soft tissue disorders: Secondary | ICD-10-CM | POA: Diagnosis not present

## 2022-01-20 DIAGNOSIS — R791 Abnormal coagulation profile: Secondary | ICD-10-CM

## 2022-01-20 DIAGNOSIS — Z91148 Patient's other noncompliance with medication regimen for other reason: Secondary | ICD-10-CM

## 2022-01-20 DIAGNOSIS — Z7901 Long term (current) use of anticoagulants: Secondary | ICD-10-CM

## 2022-01-20 DIAGNOSIS — Z79899 Other long term (current) drug therapy: Secondary | ICD-10-CM

## 2022-01-20 DIAGNOSIS — I7092 Chronic total occlusion of artery of the extremities: Secondary | ICD-10-CM | POA: Diagnosis present

## 2022-01-20 DIAGNOSIS — I70201 Unspecified atherosclerosis of native arteries of extremities, right leg: Principal | ICD-10-CM | POA: Diagnosis present

## 2022-01-20 DIAGNOSIS — I82409 Acute embolism and thrombosis of unspecified deep veins of unspecified lower extremity: Secondary | ICD-10-CM | POA: Diagnosis present

## 2022-01-20 DIAGNOSIS — I82411 Acute embolism and thrombosis of right femoral vein: Secondary | ICD-10-CM | POA: Diagnosis present

## 2022-01-20 DIAGNOSIS — I82431 Acute embolism and thrombosis of right popliteal vein: Secondary | ICD-10-CM | POA: Diagnosis present

## 2022-01-20 DIAGNOSIS — Z8673 Personal history of transient ischemic attack (TIA), and cerebral infarction without residual deficits: Secondary | ICD-10-CM

## 2022-01-20 DIAGNOSIS — Z86718 Personal history of other venous thrombosis and embolism: Secondary | ICD-10-CM

## 2022-01-20 DIAGNOSIS — G08 Intracranial and intraspinal phlebitis and thrombophlebitis: Secondary | ICD-10-CM

## 2022-01-20 DIAGNOSIS — I10 Essential (primary) hypertension: Secondary | ICD-10-CM | POA: Diagnosis present

## 2022-01-20 DIAGNOSIS — D6859 Other primary thrombophilia: Secondary | ICD-10-CM | POA: Diagnosis present

## 2022-01-20 DIAGNOSIS — G932 Benign intracranial hypertension: Secondary | ICD-10-CM | POA: Diagnosis present

## 2022-01-20 DIAGNOSIS — I82421 Acute embolism and thrombosis of right iliac vein: Secondary | ICD-10-CM | POA: Diagnosis present

## 2022-01-20 LAB — BASIC METABOLIC PANEL
Anion gap: 6 (ref 5–15)
BUN: 7 mg/dL (ref 6–20)
CO2: 27 mmol/L (ref 22–32)
Calcium: 9.4 mg/dL (ref 8.9–10.3)
Chloride: 104 mmol/L (ref 98–111)
Creatinine, Ser: 0.64 mg/dL (ref 0.44–1.00)
GFR, Estimated: >60 mL/min (ref >60)
Glucose, Bld: 90 mg/dL (ref 70–99)
Potassium: 4.1 mmol/L (ref 3.5–5.1)
Sodium: 137 mmol/L (ref 135–145)

## 2022-01-20 LAB — CBC WITH DIFFERENTIAL/PLATELET
Abs Immature Granulocytes: 0.02 10*3/uL (ref 0.00–0.07)
Basophils Absolute: 0.1 10*3/uL (ref 0.0–0.1)
Basophils Relative: 1 %
Eosinophils Absolute: 0.2 10*3/uL (ref 0.0–0.5)
Eosinophils Relative: 3 %
HCT: 41.4 % (ref 36.0–46.0)
Hemoglobin: 12.8 g/dL (ref 12.0–15.0)
Immature Granulocytes: 0 %
Lymphocytes Relative: 26 %
Lymphs Abs: 1.4 10*3/uL (ref 0.7–4.0)
MCH: 25.2 pg — ABNORMAL LOW (ref 26.0–34.0)
MCHC: 30.9 g/dL (ref 30.0–36.0)
MCV: 81.7 fL (ref 80.0–100.0)
Monocytes Absolute: 0.7 10*3/uL (ref 0.1–1.0)
Monocytes Relative: 14 %
Neutro Abs: 2.9 10*3/uL (ref 1.7–7.7)
Neutrophils Relative %: 56 %
Platelets: 344 10*3/uL (ref 150–400)
RBC: 5.07 MIL/uL (ref 3.87–5.11)
RDW: 16.9 % — ABNORMAL HIGH (ref 11.5–15.5)
WBC: 5.3 10*3/uL (ref 4.0–10.5)
nRBC: 0 % (ref 0.0–0.2)

## 2022-01-20 LAB — PROTIME-INR
INR: 4.6 (ref 0.8–1.2)
Prothrombin Time: 42.9 seconds — ABNORMAL HIGH (ref 11.4–15.2)

## 2022-01-20 NOTE — Progress Notes (Addendum)
VASCULAR LAB    Right lower extremity venous duplex has been performed.  See CV proc for preliminary results.  Gave verbal report to Army Melia, PA-C Katora Fini, RVT 01/20/2022, 7:15 PM

## 2022-01-20 NOTE — ED Provider Triage Note (Addendum)
Emergency Medicine Provider Triage Evaluation Note  Andrea Hayes , a 31 y.o. female  was evaluated in triage.  Pt complains of right leg swelling for the past few days.  States her left leg has swollen in the past like this and resolved without intervention, did not seek care at that time and unsure the cause.  No history of PE or DVT.  Denies injury Additional history of record review- hx of DVT, not compliant with Coumadin.  Review of Systems  Positive: Leg swelling Negative: Chest pain, shortness of breath  Physical Exam  BP 103/72   Pulse 83   Temp 98.7 F (37.1 C) (Oral)   Resp 14   SpO2 98%  Gen:   Awake, no distress   Resp:  Normal effort  MSK:   Moves extremities without difficulty  Other:  Swelling of the right leg compared to left.  DP pulse present.  Sensation intact.  Mild calf tenderness without palpable cord.  Medical Decision Making  Medically screening exam initiated at 6:32 PM.  Appropriate orders placed.  Andrea Hayes was informed that the remainder of the evaluation will be completed by another provider, this initial triage assessment does not replace that evaluation, and the importance of remaining in the ED until their evaluation is complete.     Jeannie Fend, PA-C 01/20/22 1833    Jeannie Fend, PA-C 01/20/22 [name]

## 2022-01-20 NOTE — ED Triage Notes (Signed)
Patient here with complaint of right leg swelling. Patient is alert, oriented, ambulatory, and in no apparent distress at this time.

## 2022-01-21 ENCOUNTER — Observation Stay (HOSPITAL_COMMUNITY): Payer: Medicaid Other

## 2022-01-21 ENCOUNTER — Emergency Department (HOSPITAL_COMMUNITY): Payer: Medicaid Other

## 2022-01-21 DIAGNOSIS — I82421 Acute embolism and thrombosis of right iliac vein: Secondary | ICD-10-CM

## 2022-01-21 DIAGNOSIS — G932 Benign intracranial hypertension: Secondary | ICD-10-CM | POA: Diagnosis present

## 2022-01-21 DIAGNOSIS — Z79899 Other long term (current) drug therapy: Secondary | ICD-10-CM | POA: Diagnosis not present

## 2022-01-21 DIAGNOSIS — I82431 Acute embolism and thrombosis of right popliteal vein: Secondary | ICD-10-CM | POA: Diagnosis present

## 2022-01-21 DIAGNOSIS — Z91148 Patient's other noncompliance with medication regimen for other reason: Secondary | ICD-10-CM | POA: Diagnosis not present

## 2022-01-21 DIAGNOSIS — I824Z1 Acute embolism and thrombosis of unspecified deep veins of right distal lower extremity: Secondary | ICD-10-CM

## 2022-01-21 DIAGNOSIS — I82411 Acute embolism and thrombosis of right femoral vein: Secondary | ICD-10-CM | POA: Diagnosis present

## 2022-01-21 DIAGNOSIS — Z7901 Long term (current) use of anticoagulants: Secondary | ICD-10-CM | POA: Diagnosis not present

## 2022-01-21 DIAGNOSIS — I82409 Acute embolism and thrombosis of unspecified deep veins of unspecified lower extremity: Secondary | ICD-10-CM | POA: Diagnosis present

## 2022-01-21 DIAGNOSIS — R791 Abnormal coagulation profile: Secondary | ICD-10-CM | POA: Diagnosis present

## 2022-01-21 DIAGNOSIS — G08 Intracranial and intraspinal phlebitis and thrombophlebitis: Secondary | ICD-10-CM | POA: Diagnosis not present

## 2022-01-21 DIAGNOSIS — I70201 Unspecified atherosclerosis of native arteries of extremities, right leg: Secondary | ICD-10-CM | POA: Diagnosis present

## 2022-01-21 DIAGNOSIS — D6859 Other primary thrombophilia: Secondary | ICD-10-CM | POA: Diagnosis present

## 2022-01-21 DIAGNOSIS — I10 Essential (primary) hypertension: Secondary | ICD-10-CM | POA: Diagnosis present

## 2022-01-21 DIAGNOSIS — Z8673 Personal history of transient ischemic attack (TIA), and cerebral infarction without residual deficits: Secondary | ICD-10-CM | POA: Diagnosis not present

## 2022-01-21 DIAGNOSIS — Z86718 Personal history of other venous thrombosis and embolism: Secondary | ICD-10-CM | POA: Diagnosis not present

## 2022-01-21 DIAGNOSIS — I7092 Chronic total occlusion of artery of the extremities: Secondary | ICD-10-CM | POA: Diagnosis present

## 2022-01-21 LAB — PROTIME-INR
INR: 4.4 (ref 0.8–1.2)
Prothrombin Time: 41.9 seconds — ABNORMAL HIGH (ref 11.4–15.2)

## 2022-01-21 LAB — HIV ANTIBODY (ROUTINE TESTING W REFLEX): HIV Screen 4th Generation wRfx: NONREACTIVE

## 2022-01-21 LAB — HEPARIN LEVEL (UNFRACTIONATED): Heparin Unfractionated: 0.19 IU/mL — ABNORMAL LOW (ref 0.30–0.70)

## 2022-01-21 MED ORDER — ACETAMINOPHEN 325 MG PO TABS
650.0000 mg | ORAL_TABLET | Freq: Four times a day (QID) | ORAL | Status: DC | PRN
  Administered 2022-01-21 – 2022-01-22 (×3): 650 mg via ORAL
  Filled 2022-01-21 (×4): qty 2

## 2022-01-21 MED ORDER — ACETAMINOPHEN 650 MG RE SUPP: 650.0000 mg | Freq: Four times a day (QID) | RECTAL | Status: DC | PRN

## 2022-01-21 MED ORDER — IOHEXOL 350 MG/ML SOLN
75.0000 mL | Freq: Once | INTRAVENOUS | Status: AC | PRN
  Administered 2022-01-21: 75 mL via INTRAVENOUS

## 2022-01-21 MED ORDER — IOHEXOL 350 MG/ML SOLN
125.0000 mL | Freq: Once | INTRAVENOUS | Status: AC | PRN
  Administered 2022-01-21: 125 mL via INTRAVENOUS

## 2022-01-21 MED ORDER — PHYTONADIONE 5 MG PO TABS
10.0000 mg | ORAL_TABLET | Freq: Once | ORAL | Status: AC
  Administered 2022-01-21: 10 mg via ORAL
  Filled 2022-01-21 (×2): qty 2

## 2022-01-21 MED ORDER — HEPARIN (PORCINE) 25000 UT/250ML-% IV SOLN
1200.0000 [IU]/h | INTRAVENOUS | Status: DC
  Administered 2022-01-21: 900 [IU]/h via INTRAVENOUS
  Filled 2022-01-21 (×2): qty 250

## 2022-01-21 NOTE — Progress Notes (Signed)
   Patient seen and examined at bedside, patient admitted after midnight, please see earlier detailed admission note by John Giovanni, MD. Briefly, patient presented secondary to right leg swelling and found to have recurrent acute DVT  Subjective: Patient reports some improvement in right leg pain  BP 115/85   Pulse 74   Temp 97.6 F (36.4 C)   Resp 16   SpO2 100%   General exam: Appears calm and comfortable Respiratory system: Clear to auscultation. Respiratory effort normal. Cardiovascular system: S1 & S2 heard, RRR. No murmurs, rubs, gallops or clicks. Gastrointestinal system: Abdomen is nondistended, soft and nontender. No organomegaly or masses felt. Normal bowel sounds heard. Central nervous system: Alert and oriented. No focal neurological deficits. Musculoskeletal: Significant RLE edema without significant tenderness Skin: No cyanosis. No rashes Psychiatry: Judgement and insight appear normal. Mood & affect appropriate.   Brief assessment/Plan:  Recurrent DVT of right lower extremity DVT involves the right mid-distal common iliac, common femoral, right femoral, right proximal profunda veins. Possibly related to variable compliance with warfarin vs medication failure. On chart review, goal INR is 2.5-3.5. Chart history reveals INR appears to have not been at target goal. INR of 4.6 on presentation this admission. Hematology, Dr. Arbutus Ped consulted and recommended to start heparin IV despite elevated INR. Vascular surgery consulted for consideration of thrombectomy and will discuss options with patient. -Follow-up Hematoogy/Vascular surgery recommendations -Heparin IV per pharmacy -Daily INR  History of intracranial hypertension Primary hypertension Patient follows with neurology. Previously on acetazolamide for which she has stopped taking. Per neurology recommendations on most recent note, plan for repeat ophthalmology evaluation prior to restarting. Patient is on  metoprolol for which she is also no longer taking. Blood pressure is well controlled, currently.  History of venous sinus thrombosis Anticoagulation as mentioned above   Family communication: None at bedside DVT prophylaxis: Heparin IV Disposition: Discharge home in 1-2 days pending specialist recommendations and transition to outpatient anticoagulation regimen  Jacquelin Hawking, MD [facility] 01/21/2022, 8:44 AM

## 2022-01-21 NOTE — ED Provider Notes (Signed)
[facility] Provider Note   CSN: [id number] Arrival date & time: 01/20/22  1747     History  No chief complaint on file.   Andrea Hayes is a 31 y.o. female.  Patient as above with significant medical history as below, including CVA, DVT b/l who presents to the ED with complaint of right leg swelling. Swelling over last week, she has hx of DVT on coumadin. Did miss 3-4 doses over the last 2 weeks. History of the same. She was instructed to increase her dose to 15mg  Thursday and then back to 10mg  qd 2/2 subtherapeutic INR. Presented to ED today 2/2 worsening leg swelling on right. No dib or cp, no falls, no numbness or tingling, no gait disturbance. No rashes. No hx PE but hx b/l DVT  Last saw oncology NP Andrea Hayes 6/21     Past Medical History:  Diagnosis Date   Stroke [facility](HCC)     Past Surgical History:  Procedure Laterality Date   BIOPSY  04/05/2021   Procedure: BIOPSY;  Surgeon: Jenel Lucksunningham, Scott E, MD;  [facility];  Service: Gastroenterology;;   COLONOSCOPY N/A 04/05/2021   Procedure: COLONOSCOPY;  Surgeon: Jenel Lucksunningham, Scott E, MD;  [facility];  Service: Gastroenterology;  Laterality: N/A;   ESOPHAGOGASTRODUODENOSCOPY N/A 04/05/2021   Procedure: ESOPHAGOGASTRODUODENOSCOPY (EGD);  Surgeon: Jenel Lucksunningham, Scott E, MD;  [facility];  Service: Gastroenterology;  Laterality: N/A;   NO PAST SURGERIES     PERIPHERAL VASCULAR BALLOON ANGIOPLASTY  03/24/2021   Procedure: PERIPHERAL VASCULAR BALLOON ANGIOPLASTY;  Surgeon: Leonie DouglasHawken, Thomas N, MD;  Location: [facility] INVASIVE CV LAB;  Service: Cardiovascular;;  bilateral common iliacs   PERIPHERAL VASCULAR THROMBECTOMY N/A 03/23/2021   Procedure: PERIPHERAL VASCULAR THROMBECTOMY;  Surgeon: Maeola Harmanain, Brandon Christopher, MD;  [facility];  Service: Cardiovascular;  Laterality: N/A;   PERIPHERAL VASCULAR THROMBECTOMY N/A 03/24/2021   Procedure: LYSIS RECHECK;  Surgeon:  Leonie DouglasHawken, Thomas N, MD;  Location: [facility] INVASIVE CV LAB;  Service: Cardiovascular;  Laterality: N/A;     The history is provided by the patient. No language interpreter was used.       Home Medications Prior to Admission medications   Medication Sig Start Date End Date Taking? Authorizing Provider  acetaZOLAMIDE (DIAMOX) 125 MG tablet Take 2 tablets (250 mg total) by mouth 2 (two) times daily. Patient not taking: Reported on 06/06/2021 12/02/20   Drema DallasJaffe, Adam R, DO  acetaZOLAMIDE (DIAMOX) 250 MG tablet Take 500 mg by mouth 2 (two) times daily. Patient not taking: Reported on 06/09/2021 05/09/21   [provider]  benzonatate (TESSALON) 100 MG capsule Take 1 capsule (100 mg total) by mouth 3 (three) times daily. Patient not taking: Reported on 06/06/2021 04/08/21   Rolly SalterPatel, Pranav M, MD  clotrimazole (CLOTRIMAZOLE 3) 2 % vaginal cream Place 1 Applicatorful vaginally at bedtime. 05/11/21   Pollyann SamplesBurton, Lacie K, NP  docusate sodium (COLACE) 100 MG capsule Take 1 capsule (100 mg total) by mouth at bedtime. 04/08/21   Rolly SalterPatel, Pranav M, MD  ferrous fumarate-b12-vitamic C-folic acid (TRINSICON / FOLTRIN) capsule Take 1 capsule by mouth 2 (two) times daily after a meal. 04/13/21   Pollyann SamplesBurton, Lacie K, NP  HYDROcodone-acetaminophen (NORCO/VICODIN) 5-325 MG tablet Take 1-2 tablets by mouth every 4 (four) hours as needed. 05/06/21   [provider]  metoprolol succinate (TOPROL-XL) 25 MG 24 hr tablet Take 1 tablet (25 mg total) by mouth daily. 03/27/21   Arnetha CourserAmin, Sumayya, MD  polyethylene glycol [facility](MIRALAX /  GLYCOLAX) 17 g packet Take 17 g by mouth daily. 04/09/21   Rolly Salter, MD  Prenatal Vit-Fe Fumarate-FA (PRENATAL VITAMIN) 27-0.8 MG TABS Take 1 tablet by mouth daily. 04/19/20   Aviva Signs, CNM  Vitamin D, Ergocalciferol, (DRISDOL) 1.25 MG (50000 UNIT) CAPS capsule Take 50,000 Units by mouth every Wednesday. 02/24/21   [provider]  warfarin (COUMADIN) 10 MG tablet TAKE 1 TABLET BY MOUTH AS  DIRECTED BY NP/MD 01/04/22   Pollyann Samples, NP      Allergies    Patient has no known allergies.    Review of Systems   Review of Systems  Constitutional:  Negative for activity change and fever.  HENT:  Negative for facial swelling and trouble swallowing.   Eyes:  Negative for discharge and redness.  Respiratory:  Negative for cough and shortness of breath.   Cardiovascular:  Positive for leg swelling. Negative for chest pain and palpitations.  Gastrointestinal:  Negative for abdominal pain and nausea.  Genitourinary:  Negative for dysuria and flank pain.  Musculoskeletal:  Negative for back pain and gait problem.  Skin:  Negative for pallor and rash.  Neurological:  Negative for syncope and headaches.    Physical Exam Updated Vital Signs BP 115/85   Pulse 74   Temp 97.6 F (36.4 C)   Resp 16   SpO2 100%  Physical Exam Vitals and nursing note reviewed.  Constitutional:      General: She is not in acute distress.    Appearance: Normal appearance.  HENT:     Head: Normocephalic and atraumatic.     Right Ear: External ear normal.     Left Ear: External ear normal.     Nose: Nose normal.     Mouth/Throat:     Mouth: Mucous membranes are moist.  Eyes:     General: No scleral icterus.       Right eye: No discharge.        Left eye: No discharge.  Cardiovascular:     Rate and Rhythm: Normal rate and regular rhythm.     Pulses: Normal pulses.     Heart sounds: Normal heart sounds.  Pulmonary:     Effort: Pulmonary effort is normal. No respiratory distress.     Breath sounds: Normal breath sounds.  Abdominal:     General: Abdomen is flat.     Palpations: Abdomen is soft.     Tenderness: There is no abdominal tenderness. There is no guarding or rebound.  Musculoskeletal:        General: Swelling present. Normal range of motion.     Cervical back: Normal range of motion.     Right lower leg: No edema.     Left lower leg: No edema.     Comments: RLE > LLE swelling. PT  pulses palpable  Skin:    General: Skin is warm and dry.     Capillary Refill: Capillary refill takes less than 2 seconds.  Neurological:     Mental Status: She is alert and oriented to person, place, and time.     GCS: GCS eye subscore is 4. GCS verbal subscore is 5. GCS motor subscore is 6.  Psychiatric:        Mood and Affect: Mood normal.        Behavior: Behavior normal.     ED Results / Procedures / Treatments   Labs (all labs ordered are listed, but only abnormal results are displayed) Labs Reviewed  CBC WITH DIFFERENTIAL/PLATELET - Abnormal; Notable for the following components:      Result Value   MCH 25.2 (*)    RDW 16.9 (*)    All other components within normal limits  PROTIME-INR - Abnormal; Notable for the following components:   Prothrombin Time 42.9 (*)    INR 4.6 (*)    All other components within normal limits  PROTIME-INR - Abnormal; Notable for the following components:   Prothrombin Time 41.9 (*)    INR 4.4 (*)    All other components within normal limits  BASIC METABOLIC PANEL    EKG None  Radiology CT Angio Chest PE W and/or Wo Contrast  Result Date: 01/21/2022 CLINICAL DATA:  Right leg swelling, elevated D-dimer, evaluate for PE EXAM: CT ANGIOGRAPHY CHEST WITH CONTRAST TECHNIQUE: Multidetector CT imaging of the chest was performed using the standard protocol during bolus administration of intravenous contrast. Multiplanar CT image reconstructions and MIPs were obtained to evaluate the vascular anatomy. RADIATION DOSE REDUCTION: This exam was performed according to the departmental dose-optimization program which includes automated exposure control, adjustment of the mA and/or kV according to patient size and/or use of iterative reconstruction technique. CONTRAST:  51mL OMNIPAQUE IOHEXOL 350 MG/ML SOLN COMPARISON:  CTA chest dated 08/02/2021 FINDINGS: Cardiovascular: Satisfactory opacification of the bilateral pulmonary arteries to the segmental level. No  evidence of pulmonary embolism. Although not tailored for evaluation of the thoracic aorta, there is no evidence thoracic aortic aneurysm or dissection. The heart is top-normal in size. Mediastinum/Nodes: No suspicious mediastinal lymphadenopathy. Visualized thyroid is unremarkable. Lungs/Pleura: Lungs are clear. No focal consolidation. No suspicious pulmonary nodules. No pleural effusion or pneumothorax. Upper Abdomen: Visualized upper abdomen is grossly unremarkable. Musculoskeletal: Visualized osseous structures are within normal limits. Review of the MIP images confirms the above findings. IMPRESSION: No evidence of pulmonary embolism. Negative CT chest. Electronically Signed   By: Charline Bills M.D.   On: 01/21/2022 02:59   VAS [place] LOWER EXTREMITY VENOUS (DVT) (7a-7p)  Result Date: 01/20/2022  Lower Venous DVT Study Patient Name:  MEAH JIRON  Date of Exam:   01/20/2022 Medical Rec #: [id number]       Accession #:    [id number] Date of Birth: July 28, 1990       Patient Gender: F Patient Age:   16 years Exam Location[facility]  [facility] Procedure:      VAS [place] LOWER EXTREMITY VENOUS (DVT) Referring Phys: Army Melia --------------------------------------------------------------------------------  Indications: Swelling.  Risk Factors: Surgery bilateral vena cava, iliac, femoral endovascular thrombectomy and right common iliac vein angioplasty 03/24/21. Anticoagulation: Coumadin (patient admitted she is not fully compliant on medication). Comparison Study: Prior left lower extremity 04/01/21 demonstrated acute DVT in                   the femoral and PTV veins as well as age indeterminate DVT in                   the right common femoral vein. Performing Technologist: Sherren Kerns RVS  Examination Guidelines: A complete evaluation includes B-mode imaging, spectral Doppler, color Doppler, and power Doppler as needed of all accessible portions of each vessel. Bilateral testing is considered an integral part  of a complete examination. Limited examinations for reoccurring indications may be performed as noted. The reflux portion of the exam is performed with the patient in reverse Trendelenburg.  +----------+---------------+---------+-----------+----------+-----------------+ RIGHT     CompressibilityPhasicitySpontaneityPropertiesThrombus Aging    +----------+---------------+---------+-----------+----------+-----------------+ CFV  None           No       No                   Acute             +----------+---------------+---------+-----------+----------+-----------------+ SFJ       Full                                                           +----------+---------------+---------+-----------+----------+-----------------+ FV Prox   None           No       No                   Acute             +----------+---------------+---------+-----------+----------+-----------------+ FV Mid    None           No       No                   Acute             +----------+---------------+---------+-----------+----------+-----------------+ FV Distal None           No       No                   Acute             +----------+---------------+---------+-----------+----------+-----------------+ PFV       None           No       No                   Acute             +----------+---------------+---------+-----------+----------+-----------------+ POP       Partial        No       No                   Age Indeterminate +----------+---------------+---------+-----------+----------+-----------------+ PTV       Full                                                           +----------+---------------+---------+-----------+----------+-----------------+ PERO      Full                                                           +----------+---------------+---------+-----------+----------+-----------------+ Gastroc   Full                                                            +----------+---------------+---------+-----------+----------+-----------------+ CIV Distal               No  No                   Acute             +----------+---------------+---------+-----------+----------+-----------------+ CIV mid                  No       No                   Acute             +----------+---------------+---------+-----------+----------+-----------------+ Proximal CIV and IVC appear patent by color and Doppler  +----------+---------------+---------+-----------+----------+--------------+ LEFT      CompressibilityPhasicitySpontaneityPropertiesThrombus Aging +----------+---------------+---------+-----------+----------+--------------+ CFV       Full           Yes                                          +----------+---------------+---------+-----------+----------+--------------+ CIV distal                                                            +----------+---------------+---------+-----------+----------+--------------+ CIV mid                                                               +----------+---------------+---------+-----------+----------+--------------+     Summary: RIGHT: - Findings consistent with acute deep vein thrombosis involving the right , Mid to distal common iliac vein, common femoral vein, right femoral vein, and right proximal profunda vein. The proximal common iliac and IVC appear patent by color and Doppler - Findings consistent with age indeterminate deep vein thrombosis involving the right popliteal vein.   *See table(s) above for measurements and observations.    Preliminary     Procedures .Critical Care  Performed by: Sloan Leiter, DO Authorized by: Sloan Leiter, DO   Critical care provider statement:    Critical care time (minutes):  30   Critical care time was exclusive of:  Separately billable procedures and treating other patients   Critical care was necessary to treat or prevent imminent or  life-threatening deterioration of the following conditions:  Circulatory failure   Critical care was time spent personally by me on the following activities:  Development of treatment plan with patient or surrogate, discussions with consultants, evaluation of patient's response to treatment, examination of patient, ordering and review of laboratory studies, ordering and review of radiographic studies, ordering and performing treatments and interventions, pulse oximetry, re-evaluation of patient's condition, review of old charts and obtaining history from patient or surrogate   Care discussed with: admitting provider       Medications Ordered in ED Medications  iohexol (OMNIPAQUE) 350 MG/ML injection 75 mL (75 mLs Intravenous Contrast Given 01/21/22 0243)    ED Course/ Medical Decision Making/ A&P Clinical Course as of 01/21/22 0539  Sun Jan 21, 2022  0322 Right-sided iliofemoral DVT despite supratherapeutic warfarin.  Will discuss with vascular surgery.  Status post iliofemoral peripheral thrombectomy around 6 months ago.  CT PE negative [SG]  Clinical Course User Index [SG] Sloan Leiter, DO                           Medical Decision Making Amount and/or Complexity of Data Reviewed Labs: ordered. Radiology: ordered.  Risk Prescription drug management. Decision regarding hospitalization.    CC: leg swelling  This patient presents to the Emergency Department for the above complaint. This involves an extensive number of treatment options and is a complaint that carries with it a high risk of complications and morbidity. Vital signs were reviewed. Serious etiologies considered.  Ddx includes but not limited to dvt, PE, lymphadema, cellulitis, sprain, strain, soft tissue, other acute etiologies considered.    Record review:  Previous records obtained and reviewed prior office notes, prior labs and imaging  Additional history obtained from spouse   Medical and surgical history  as noted above.   Work up as above, notable for:  Labs & imaging results that were available during my care of the patient were visualized by me and considered in my medical decision making.  Physical exam as above.   imaging studies ordered which included LE duplex, CTPE. I visualized the imaging, interpreted images, and I agree with radiologist interpretation. Right DVT or mid to distal common iliac vein, CFV, RFV, right proximal profunda vein. Also right popliteal DVT age indeterminate.  CT PE w/o acute PE    Management: Pt with DVT despite supra therapeutic INR, will start heparin   ED Course: Clinical Course as of 01/21/22 0539  Sun Jan 21, 2022  0322 Right-sided iliofemoral DVT despite supratherapeutic warfarin.  Will discuss with vascular surgery.  Status post iliofemoral peripheral thrombectomy around 6 months ago.  CT PE negative [SG]    Clinical Course User Index [SG] Sloan Leiter, DO     Reassessment:  Symptoms unchanged  Paged vascular surgery, no call back. Would recommend admitting team consult in the morning. Pt s/p peripheral thrombectomy 9/22 w/ Dr Randie Heinz. Given proximal DVT iliofemoral despite elevated INR would favor admission with vascular consult for possible rpt thrombectomy. Pt agreeable. Will admit to hospitalist service.   Admission was considered.     Spoke with Dr Loney Loh who accepts pt for admission.            Social determinants of health include -  Social History   Socioeconomic History   Marital status: Single    Spouse name: Not on file   Number of children: Not on file   Years of education: Not on file   Highest education level: Not on file  Occupational History   Not on file  Tobacco Use   Smoking status: Never   Smokeless tobacco: Never  Vaping Use   Vaping Use: Never used  Substance and Sexual Activity   Alcohol use: No    Alcohol/week: 0.0 standard drinks of alcohol   Drug use: No   Sexual activity: Yes    Birth  control/protection: None  Other Topics Concern   Not on file  Social History Narrative   Right handed   Social Determinants of Health   Financial Resource Strain: Low Risk  (06/06/2021)   Overall Financial Resource Strain (CARDIA)    Difficulty of Paying Living Expenses: Not very hard  Food Insecurity: No Food Insecurity (06/06/2021)   Hunger Vital Sign    Worried About Running Out of Food in the Last Year: Never true    Ran Out of Food in the  Last Year: Never true  Transportation Needs: No Transportation Needs (06/06/2021)   PRAPARE - Administrator, Civil Service (Medical): No    Lack of Transportation (Non-Medical): No  Physical Activity: Inactive (06/06/2021)   Exercise Vital Sign    Days of Exercise per Week: 0 days    Minutes of Exercise per Session: 0 min  Stress: No Stress Concern Present (06/06/2021)   [employer] of Occupational Health - Occupational Stress Questionnaire    Feeling of Stress : Only a little  Social Connections: Moderately Integrated (06/06/2021)   Social Connection and Isolation Panel [NHANES]    Frequency of Communication with Friends and Family: More than three times a week    Frequency of Social Gatherings with Friends and Family: More than three times a week    Attends Religious Services: 1 to 4 times per year    Active Member of [employer] or Organizations: Yes    Attends Banker Meetings: 1 to 4 times per year    Marital Status: Never married  Intimate Partner Violence: Not At Risk (06/02/2021)   Humiliation, Afraid, Rape, and Kick questionnaire    Fear of Current or Ex-Partner: No    Emotionally Abused: No    Physically Abused: No    Sexually Abused: No      This chart was dictated using voice recognition software.  Despite best efforts to proofread,  errors can occur which can change the documentation meaning.         Final Clinical Impression(s) / ED Diagnoses Final diagnoses:  Acute deep vein thrombosis  (DVT) of iliac vein of right lower extremity (HCC)  Supratherapeutic INR    Rx / DC Orders ED Discharge Orders     None         Sloan Leiter, DO 01/21/22 0540

## 2022-01-21 NOTE — Consult Note (Signed)
Hospital Consult    Reason for Consult: Right lower extremity recurrent DVT Requesting Physician: ED MRN #:  [id number]  History of Present Illness: This is a 31 y.o. female well-known to the vascular surgery service having undergone bilateral percutaneous venous pharmacomechanical thrombectomy in September of last year.  She presents today with new onset right lower extremity pain and swelling.  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  Prior INRs have been subtherapeutic.  Supratherapeutic today.  Past Medical History:  Diagnosis Date   Stroke [facility])     Past Surgical History:  Procedure Laterality Date   BIOPSY  04/05/2021   Procedure: BIOPSY;  Surgeon: Jenel Lucks, MD;  Location: [facility] ENDOSCOPY;  Service: Gastroenterology;;   COLONOSCOPY N/A 04/05/2021   Procedure: COLONOSCOPY;  Surgeon: Jenel Lucks, MD;  Location: [facility] ENDOSCOPY;  Service: Gastroenterology;  Laterality: N/A;   ESOPHAGOGASTRODUODENOSCOPY N/A 04/05/2021   Procedure: ESOPHAGOGASTRODUODENOSCOPY (EGD);  Surgeon: Jenel Lucks, MD;  Location: [facility] ENDOSCOPY;  Service: Gastroenterology;  Laterality: N/A;   NO PAST SURGERIES     PERIPHERAL VASCULAR BALLOON ANGIOPLASTY  03/24/2021   Procedure: PERIPHERAL VASCULAR BALLOON ANGIOPLASTY;  Surgeon: Leonie Douglas, MD;  Location: [facility] INVASIVE CV LAB;  Service: Cardiovascular;;  bilateral common iliacs   PERIPHERAL VASCULAR THROMBECTOMY N/A 03/23/2021   Procedure: PERIPHERAL VASCULAR THROMBECTOMY;  Surgeon: Maeola Harman, MD;  Location: [facility] INVASIVE CV LAB;  Service: Cardiovascular;  Laterality: N/A;   PERIPHERAL VASCULAR THROMBECTOMY N/A 03/24/2021   Procedure: LYSIS RECHECK;  Surgeon: Leonie Douglas, MD;  Location: [facility] INVASIVE CV LAB;  Service: Cardiovascular;  Laterality: N/A;    No Known Allergies  Prior to Admission medications   Medication Sig Start Date End Date  Taking? Authorizing Provider  acetaminophen (TYLENOL) 500 MG tablet Take 1,000 mg by mouth every 6 (six) hours as needed for mild pain.   Yes [provider]  acetaZOLAMIDE (DIAMOX) 125 MG tablet Take 2 tablets (250 mg total) by mouth 2 (two) times daily. 12/02/20  Yes Everlena Cooper, Adam R, DO  cetirizine (ZYRTEC) 10 MG tablet Take 10 mg by mouth daily as needed for allergies. 01/04/22  Yes [provider]  diclofenac Sodium (VOLTAREN) 1 % GEL Apply 4 g topically 4 (four) times daily as needed for pain. 12/03/21  Yes [provider]  docusate sodium (COLACE) 100 MG capsule Take 1 capsule (100 mg total) by mouth at bedtime. Patient taking differently: Take 100 mg by mouth daily as needed for mild constipation. 04/08/21  Yes Rolly Salter, MD  polyethylene glycol (MIRALAX / GLYCOLAX) 17 g packet Take 17 g by mouth daily. Patient taking differently: Take 17 g by mouth daily as needed for mild constipation. 04/09/21  Yes Rolly Salter, MD  Prenatal Vit-Fe Fumarate-FA (PRENATAL VITAMIN) 27-0.8 MG TABS Take 1 tablet by mouth daily. 04/19/20  Yes Aviva Signs, CNM  tiZANidine (ZANAFLEX) 2 MG tablet Take 2 mg by mouth 2 (two) times daily as needed for muscle spasms. 01/01/22  Yes [provider]  Vitamin D, Ergocalciferol, (DRISDOL) 1.25 MG (50000 UNIT) CAPS capsule Take 50,000 Units by mouth every 7 (seven) days. Saturday 02/24/21  Yes [provider]  warfarin (COUMADIN) 10 MG tablet TAKE 1 TABLET BY MOUTH AS DIRECTED BY NP/MD Patient taking differently: Take 10 mg by mouth See admin instructions. Taking 10 mg on Monday, Tuesday, Thursday, Saturday & Sunday 01/04/22  Yes Pollyann Samples, NP  warfarin (COUMADIN) 7.5 MG tablet Take 7.5 mg by mouth See admin instructions. Taking on Wednesday and Friday 01/01/22  Yes [provider]  metoprolol succinate (TOPROL-XL) 25 MG 24 hr tablet Take 1 tablet (25 mg total) by mouth daily. Patient not taking: Reported on 01/21/2022  03/27/21   Arnetha Courser, MD    Social History   Socioeconomic History   Marital status: Single    Spouse name: Not on file   Number of children: Not on file   Years of education: Not on file   Highest education level: Not on file  Occupational History   Not on file  Tobacco Use   Smoking status: Never   Smokeless tobacco: Never  Vaping Use   Vaping Use: Never used  Substance and Sexual Activity   Alcohol use: No    Alcohol/week: 0.0 standard drinks of alcohol   Drug use: No   Sexual activity: Yes    Birth control/protection: None  Other Topics Concern   Not on file  Social History Narrative   Right handed   Social Determinants of Health   Financial Resource Strain: Low Risk  (06/06/2021)   Overall Financial Resource Strain (CARDIA)    Difficulty of Paying Living Expenses: Not very hard  Food Insecurity: No Food Insecurity (06/06/2021)   Hunger Vital Sign    Worried About Running Out of Food in the Last Year: Never true    Ran Out of Food in the Last Year: Never true  Transportation Needs: No Transportation Needs (06/06/2021)   PRAPARE - Administrator, Civil Service (Medical): No    Lack of Transportation (Non-Medical): No  Physical Activity: Inactive (06/06/2021)   Exercise Vital Sign    Days of Exercise per Week: 0 days    Minutes of Exercise per Session: 0 min  Stress: No Stress Concern Present (06/06/2021)   [employer] of Occupational Health - Occupational Stress Questionnaire    Feeling of Stress : Only a little  Social Connections: Moderately Integrated (06/06/2021)   Social Connection and Isolation Panel [NHANES]    Frequency of Communication with Friends and Family: More than three times a week    Frequency of Social Gatherings with Friends and Family: More than three times a week    Attends Religious Services: 1 to 4 times per year    Active Member of [employer] or Organizations: Yes    Attends Banker Meetings: 1 to 4 times per year     Marital Status: Never married  Intimate Partner Violence: Not At Risk (06/02/2021)   Humiliation, Afraid, Rape, and Kick questionnaire    Fear of Current or Ex-Partner: No    Emotionally Abused: No    Physically Abused: No    Sexually Abused: No   Family History  Problem Relation Age of Onset   Asthma Father    Obesity Neg Hx    Hypertension Neg Hx    Diabetes Neg Hx    Cancer Neg Hx     ROS: Otherwise negative unless mentioned in HPI  Physical Examination  Vitals:   01/21/22 0530 01/21/22 1113  BP: 115/85 104/70  Pulse: 74 68  Resp: 16 18  Temp: 97.6 F (36.4 C) 97.6 F (36.4 C)  SpO2: 100% 100%   There is no height or weight on file to calculate BMI.  General:  WDWN in NAD Gait: Not observed HENT: WNL, normocephalic Pulmonary: normal non-labored breathing, without Rales, rhonchi,  wheezing Cardiac: regular Abdomen: soft,  NT/ND, no masses Skin: without rashes Vascular Exam/Pulses: +1DP bilaterally Extremities: without ischemic changes, without Gangrene , without cellulitis; without open wounds;  Musculoskeletal: no muscle wasting or atrophy  Neurologic: A&O X 3;  No focal weakness or paresthesias are detected; speech is fluent/normal Psychiatric:  The pt has Normal affect. Lymph:  Unremarkable  CBC    Component Value Date/Time   WBC 5.3 01/20/2022 1903   RBC 5.07 01/20/2022 1903   HGB 12.8 01/20/2022 1903   HGB 12.9 01/17/2022 0834   HGB 13.6 10/31/2020 1343   HCT 41.4 01/20/2022 1903   HCT 40.7 10/31/2020 1343   PLT 344 01/20/2022 1903   PLT 357 01/17/2022 0834   PLT 148 (L) 10/31/2020 1343   MCV 81.7 01/20/2022 1903   MCV 88 10/31/2020 1343   MCH 25.2 (L) 01/20/2022 1903   MCHC 30.9 01/20/2022 1903   RDW 16.9 (H) 01/20/2022 1903   RDW 14.2 10/31/2020 1343   LYMPHSABS 1.4 01/20/2022 1903   LYMPHSABS 1.3 05/05/2020 1432   MONOABS 0.7 01/20/2022 1903   EOSABS 0.2 01/20/2022 1903   EOSABS 0.1 05/05/2020 1432   BASOSABS 0.1 01/20/2022 1903    BASOSABS 0.1 05/05/2020 1432    BMET    Component Value Date/Time   NA 137 01/20/2022 1903   K 4.1 01/20/2022 1903   CL 104 01/20/2022 1903   CO2 27 01/20/2022 1903   GLUCOSE 90 01/20/2022 1903   BUN 7 01/20/2022 1903   CREATININE 0.64 01/20/2022 1903   CREATININE 0.63 12/20/2021 0902   CALCIUM 9.4 01/20/2022 1903   GFRNONAA >60 01/20/2022 1903   GFRNONAA >60 12/20/2021 0902   GFRAA >60 01/27/2020 0737    COAGS: Lab Results  Component Value Date   INR 4.4 (HH) 01/21/2022   INR 4.6 (HH) 01/20/2022   INR 2.2 (H) 01/17/2022     ASSESSMENT/PLAN: This is a 31 y.o. 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  She would benefit from repeat percutaneous thrombectomy in an effort to alleviate right lower extremity symptoms.  After discussing the risk and benefits, Tareva elected to proceed.    We will plan for procedure tomorrow.  Patient currently on heparin.  Will order vitamin K in an effort to decrease INR prior to the OR.  Should INR remain high, will need to reschedule.      Fara Olden MD [facility] [phone] 01/21/2022  1:10 PM

## 2022-01-21 NOTE — H&P (View-Only) (Signed)
Hospital Consult    Reason for Consult: Right lower extremity recurrent DVT Requesting Physician: ED MRN #:  [id number]  History of Present Illness: This is a 31 y.o. female well-known to the vascular surgery service having undergone bilateral percutaneous venous pharmacomechanical thrombectomy in September of last year.  She presents today with new onset right lower extremity pain and swelling.  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  Prior INRs have been subtherapeutic.  Supratherapeutic today.  Past Medical History:  Diagnosis Date   Stroke (H[facility])     Past Surgical History:  Procedure Laterality Date   BIOPSY  04/05/2021   Procedure: BIOPSY;  Surgeon: Cunningham, Scott E, MD;  Location: [facility] ENDOSCOPY;  Service: Gastroenterology;;   COLONOSCOPY N/A 04/05/2021   Procedure: COLONOSCOPY;  Surgeon: Cunningham, Scott E, MD;  Location: [facility] ENDOSCOPY;  Service: Gastroenterology;  Laterality: N/A;   ESOPHAGOGASTRODUODENOSCOPY N/A 04/05/2021   Procedure: ESOPHAGOGASTRODUODENOSCOPY (EGD);  Surgeon: Cunningham, Scott E, MD;  Location: [facility] ENDOSCOPY;  Service: Gastroenterology;  Laterality: N/A;   NO PAST SURGERIES     PERIPHERAL VASCULAR BALLOON ANGIOPLASTY  03/24/2021   Procedure: PERIPHERAL VASCULAR BALLOON ANGIOPLASTY;  Surgeon: Hawken, Thomas N, MD;  Location: [facility] INVASIVE CV LAB;  Service: Cardiovascular;;  bilateral common iliacs   PERIPHERAL VASCULAR THROMBECTOMY N/A 03/23/2021   Procedure: PERIPHERAL VASCULAR THROMBECTOMY;  Surgeon: Cain, Brandon Christopher, MD;  Location: [facility] INVASIVE CV LAB;  Service: Cardiovascular;  Laterality: N/A;   PERIPHERAL VASCULAR THROMBECTOMY N/A 03/24/2021   Procedure: LYSIS RECHECK;  Surgeon: Hawken, Thomas N, MD;  Location: [facility] INVASIVE CV LAB;  Service: Cardiovascular;  Laterality: N/A;    No Known Allergies  Prior to Admission medications   Medication Sig Start Date End Date  Taking? Authorizing Provider  acetaminophen (TYLENOL) 500 MG tablet Take 1,000 mg by mouth every 6 (six) hours as needed for mild pain.   Yes [provider]  acetaZOLAMIDE (DIAMOX) 125 MG tablet Take 2 tablets (250 mg total) by mouth 2 (two) times daily. 12/02/20  Yes Jaffe, Adam R, DO  cetirizine (ZYRTEC) 10 MG tablet Take 10 mg by mouth daily as needed for allergies. 01/04/22  Yes [provider]  diclofenac Sodium (VOLTAREN) 1 % GEL Apply 4 g topically 4 (four) times daily as needed for pain. 12/03/21  Yes [provider]  docusate sodium (COLACE) 100 MG capsule Take 1 capsule (100 mg total) by mouth at bedtime. Patient taking differently: Take 100 mg by mouth daily as needed for mild constipation. 04/08/21  Yes Patel, Pranav M, MD  polyethylene glycol (MIRALAX / GLYCOLAX) 17 g packet Take 17 g by mouth daily. Patient taking differently: Take 17 g by mouth daily as needed for mild constipation. 04/09/21  Yes Patel, Pranav M, MD  Prenatal Vit-Fe Fumarate-FA (PRENATAL VITAMIN) 27-0.8 MG TABS Take 1 tablet by mouth daily. 04/19/20  Yes Williams, Marie L, CNM  tiZANidine (ZANAFLEX) 2 MG tablet Take 2 mg by mouth 2 (two) times daily as needed for muscle spasms. 01/01/22  Yes [provider]  Vitamin D, Ergocalciferol, (DRISDOL) 1.25 MG (50000 UNIT) CAPS capsule Take 50,000 Units by mouth every 7 (seven) days. Saturday 02/24/21  Yes [provider]  warfarin (COUMADIN) 10 MG tablet TAKE 1 TABLET BY MOUTH AS DIRECTED BY NP/MD Patient taking differently: Take 10 mg by mouth See admin instructions. Taking 10 mg on Monday, Tuesday, Thursday, Saturday & Sunday 01/04/22  Yes Burton, Lacie K, NP    warfarin (COUMADIN) 7.5 MG tablet Take 7.5 mg by mouth See admin instructions. Taking on Wednesday and Friday 01/01/22  Yes [provider]  metoprolol succinate (TOPROL-XL) 25 MG 24 hr tablet Take 1 tablet (25 mg total) by mouth daily. Patient not taking: Reported on 01/21/2022  03/27/21   Arnetha Courser, MD    Social History   Socioeconomic History   Marital status: Single    Spouse name: Not on file   Number of children: Not on file   Years of education: Not on file   Highest education level: Not on file  Occupational History   Not on file  Tobacco Use   Smoking status: Never   Smokeless tobacco: Never  Vaping Use   Vaping Use: Never used  Substance and Sexual Activity   Alcohol use: No    Alcohol/week: 0.0 standard drinks of alcohol   Drug use: No   Sexual activity: Yes    Birth control/protection: None  Other Topics Concern   Not on file  Social History Narrative   Right handed   Social Determinants of Health   Financial Resource Strain: Low Risk  (06/06/2021)   Overall Financial Resource Strain (CARDIA)    Difficulty of Paying Living Expenses: Not very hard  Food Insecurity: No Food Insecurity (06/06/2021)   Hunger Vital Sign    Worried About Running Out of Food in the Last Year: Never true    Ran Out of Food in the Last Year: Never true  Transportation Needs: No Transportation Needs (06/06/2021)   PRAPARE - Administrator, Civil Service (Medical): No    Lack of Transportation (Non-Medical): No  Physical Activity: Inactive (06/06/2021)   Exercise Vital Sign    Days of Exercise per Week: 0 days    Minutes of Exercise per Session: 0 min  Stress: No Stress Concern Present (06/06/2021)   [employer] of Occupational Health - Occupational Stress Questionnaire    Feeling of Stress : Only a little  Social Connections: Moderately Integrated (06/06/2021)   Social Connection and Isolation Panel [NHANES]    Frequency of Communication with Friends and Family: More than three times a week    Frequency of Social Gatherings with Friends and Family: More than three times a week    Attends Religious Services: 1 to 4 times per year    Active Member of [employer] or Organizations: Yes    Attends Banker Meetings: 1 to 4 times per year     Marital Status: Never married  Intimate Partner Violence: Not At Risk (06/02/2021)   Humiliation, Afraid, Rape, and Kick questionnaire    Fear of Current or Ex-Partner: No    Emotionally Abused: No    Physically Abused: No    Sexually Abused: No   Family History  Problem Relation Age of Onset   Asthma Father    Obesity Neg Hx    Hypertension Neg Hx    Diabetes Neg Hx    Cancer Neg Hx     ROS: Otherwise negative unless mentioned in HPI  Physical Examination  Vitals:   01/21/22 0530 01/21/22 1113  BP: 115/85 104/70  Pulse: 74 68  Resp: 16 18  Temp: 97.6 F (36.4 C) 97.6 F (36.4 C)  SpO2: 100% 100%   There is no height or weight on file to calculate BMI.  General:  WDWN in NAD Gait: Not observed HENT: WNL, normocephalic Pulmonary: normal non-labored breathing, without Rales, rhonchi,  wheezing Cardiac: regular Abdomen: soft,  NT/ND, no masses Skin: without rashes Vascular Exam/Pulses: +1DP bilaterally Extremities: without ischemic changes, without Gangrene , without cellulitis; without open wounds;  Musculoskeletal: no muscle wasting or atrophy  Neurologic: A&O X 3;  No focal weakness or paresthesias are detected; speech is fluent/normal Psychiatric:  The pt has Normal affect. Lymph:  Unremarkable  CBC    Component Value Date/Time   WBC 5.3 01/20/2022 1903   RBC 5.07 01/20/2022 1903   HGB 12.8 01/20/2022 1903   HGB 12.9 01/17/2022 0834   HGB 13.6 10/31/2020 1343   HCT 41.4 01/20/2022 1903   HCT 40.7 10/31/2020 1343   PLT 344 01/20/2022 1903   PLT 357 01/17/2022 0834   PLT 148 (L) 10/31/2020 1343   MCV 81.7 01/20/2022 1903   MCV 88 10/31/2020 1343   MCH 25.2 (L) 01/20/2022 1903   MCHC 30.9 01/20/2022 1903   RDW 16.9 (H) 01/20/2022 1903   RDW 14.2 10/31/2020 1343   LYMPHSABS 1.4 01/20/2022 1903   LYMPHSABS 1.3 05/05/2020 1432   MONOABS 0.7 01/20/2022 1903   EOSABS 0.2 01/20/2022 1903   EOSABS 0.1 05/05/2020 1432   BASOSABS 0.1 01/20/2022 1903    BASOSABS 0.1 05/05/2020 1432    BMET    Component Value Date/Time   NA 137 01/20/2022 1903   K 4.1 01/20/2022 1903   CL 104 01/20/2022 1903   CO2 27 01/20/2022 1903   GLUCOSE 90 01/20/2022 1903   BUN 7 01/20/2022 1903   CREATININE 0.64 01/20/2022 1903   CREATININE 0.63 12/20/2021 0902   CALCIUM 9.4 01/20/2022 1903   GFRNONAA >60 01/20/2022 1903   GFRNONAA >60 12/20/2021 0902   GFRAA >60 01/27/2020 0737    COAGS: Lab Results  Component Value Date   INR 4.4 (HH) 01/21/2022   INR 4.6 (HH) 01/20/2022   INR 2.2 (H) 01/17/2022     ASSESSMENT/PLAN: This is a 31 y.o. 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  She would benefit from repeat percutaneous thrombectomy in an effort to alleviate right lower extremity symptoms.  After discussing the risk and benefits, Tareva elected to proceed.    We will plan for procedure tomorrow.  Patient currently on heparin.  Will order vitamin K in an effort to decrease INR prior to the OR.  Should INR remain high, will need to reschedule.      Fara Olden MD [facility] [phone] 01/21/2022  1:10 PM

## 2022-01-21 NOTE — ED Notes (Signed)
Updated pt on plan of care

## 2022-01-21 NOTE — ED Notes (Signed)
ED TO INPATIENT HANDOFF REPORT  ED Nurse Name and Phone #: Irving Burton RN [phone]  S Name/Age/Gender Andrea Hayes 31 y.o. female Room/Bed: H022C/H022C  Code Status   Code Status: Full Code  Home/SNF/Other Home Patient oriented to: self, place, time, and situation Is this baseline? Yes   Triage Complete: Triage complete  Chief Complaint Acute DVT (deep venous thrombosis) (HCC) [I82.409]  Triage Note Patient here with complaint of right leg swelling. Patient is alert, oriented, ambulatory, and in no apparent distress at this time.   Allergies No Known Allergies  Level of Care/Admitting Diagnosis ED Disposition     ED Disposition  Admit   Condition  --   Comment  Hospital Area: [facility] [[id number]]  Level of Care: Telemetry Medical [104]  May admit patient to [facility] or [facility] if equivalent level of care is available:: No  Covid Evaluation: Asymptomatic - no recent exposure (last 10 days) testing not required  Diagnosis: Acute DVT (deep venous thrombosis) [facility]) [[id number]]  Admitting Physician: John Giovanni [[id number]]  Attending Physician: Narda Bonds [phone]  Certification:: I certify this patient will need inpatient services for at least 2 midnights  Estimated Length of Stay: 2          B Medical/Surgery History Past Medical History:  Diagnosis Date   Stroke [facility])    Past Surgical History:  Procedure Laterality Date   BIOPSY  04/05/2021   Procedure: BIOPSY;  Surgeon: Jenel Lucks, MD;  Location: [facility] ENDOSCOPY;  Service: Gastroenterology;;   COLONOSCOPY N/A 04/05/2021   Procedure: COLONOSCOPY;  Surgeon: Jenel Lucks, MD;  Location: [facility] ENDOSCOPY;  Service: Gastroenterology;  Laterality: N/A;   ESOPHAGOGASTRODUODENOSCOPY N/A 04/05/2021   Procedure: ESOPHAGOGASTRODUODENOSCOPY (EGD);  Surgeon: Jenel Lucks, MD;  Location[facility] ENDOSCOPY;  Service: Gastroenterology;  Laterality: N/A;   NO PAST SURGERIES      PERIPHERAL VASCULAR BALLOON ANGIOPLASTY  03/24/2021   Procedure: PERIPHERAL VASCULAR BALLOON ANGIOPLASTY;  Surgeon: Leonie Douglas, MD;  Location: [facility] INVASIVE CV LAB;  Service: Cardiovascular;;  bilateral common iliacs   PERIPHERAL VASCULAR THROMBECTOMY N/A 03/23/2021   Procedure: PERIPHERAL VASCULAR THROMBECTOMY;  Surgeon: Maeola Harman, MD;  Location: [facility] INVASIVE CV LAB;  Service: Cardiovascular;  Laterality: N/A;   PERIPHERAL VASCULAR THROMBECTOMY N/A 03/24/2021   Procedure: LYSIS RECHECK;  Surgeon: Leonie Douglas, MD;  Location: [facility] INVASIVE CV LAB;  Service: Cardiovascular;  Laterality: N/A;     A IV Location/Drains/Wounds Patient Lines/Drains/Airways Status     Active Line/Drains/Airways     Name Placement date Placement time Site Days   Peripheral IV 01/21/22 20 G 1" Left Antecubital 01/21/22  0218  Antecubital  less than 1   Peripheral IV 01/21/22 20 G Anterior;Left Forearm 01/21/22  1142  Forearm  less than 1   Incision (Closed) 04/01/21 Knee Left;Posterior 04/01/21  1600  -- 295   Incision (Closed) 04/01/21 Knee Posterior;Right 04/01/21  1759  -- 295            Intake/Output Last 24 hours  Intake/Output Summary (Last 24 hours) at 01/21/2022 1807 Last data filed at 01/21/2022 1800 Gross per 24 hour  Intake 61.55 ml  Output --  Net 61.55 ml    Labs/Imaging Results for orders placed or performed during the hospital encounter of 01/20/22 (from the past 48 hour(s))  CBC with Differential     Status: Abnormal   Collection Time: 01/20/22  7:03 PM  Result Value Ref Range   WBC 5.3 4.0 -  10.5 K/uL   RBC 5.07 3.87 - 5.11 MIL/uL   Hemoglobin 12.8 12.0 - 15.0 g/dL   HCT 14.4 31.5 - 40.0 %   MCV 81.7 80.0 - 100.0 fL   MCH 25.2 (L) 26.0 - 34.0 pg   MCHC 30.9 30.0 - 36.0 g/dL   RDW 86.7 (H) 61.9 - 50.9 %   Platelets 344 150 - 400 K/uL   nRBC 0.0 0.0 - 0.2 %   Neutrophils Relative % 56 %   Neutro Abs 2.9 1.7 - 7.7 K/uL   Lymphocytes Relative 26 %   Lymphs Abs  1.4 0.7 - 4.0 K/uL   Monocytes Relative 14 %   Monocytes Absolute 0.7 0.1 - 1.0 K/uL   Eosinophils Relative 3 %   Eosinophils Absolute 0.2 0.0 - 0.5 K/uL   Basophils Relative 1 %   Basophils Absolute 0.1 0.0 - 0.1 K/uL   Immature Granulocytes 0 %   Abs Immature Granulocytes 0.02 0.00 - 0.07 K/uL    Comment: Performed at [facility], [address], [place], [place] [address]  Basic metabolic panel     Status: None   Collection Time: 01/20/22  7:03 PM  Result Value Ref Range   Sodium 137 135 - 145 mmol/L   Potassium 4.1 3.5 - 5.1 mmol/L   Chloride 104 98 - 111 mmol/L   CO2 27 22 - 32 mmol/L   Glucose, Bld 90 70 - 99 mg/dL    Comment: Glucose reference range applies only to samples taken after fasting for at least 8 hours.   BUN 7 6 - 20 mg/dL   Creatinine, Ser 2.45 0.44 - 1.00 mg/dL   Calcium 9.4 8.9 - 80.9 mg/dL   GFR, Estimated >98 >33 mL/min    Comment: (NOTE) Calculated using the CKD-EPI Creatinine Equation (2021)    Anion gap 6 5 - 15    Comment: Performed at [facility], [address], [place], [place] [address]  Protime-INR     Status: Abnormal   Collection Time: 01/20/22  7:03 PM  Result Value Ref Range   Prothrombin Time 42.9 (H) 11.4 - 15.2 seconds   INR 4.6 (HH) 0.8 - 1.2    Comment: REPEATED TO VERIFY CRITICAL RESULT CALLED TO, READ BACK BY AND VERIFIED WITH: Called to [employer], RN @1953  01/20/2022 by S.Stanley (NOTE) INR goal varies based on device and disease states. Performed at [facility], [address], [place], [place]   Protime-INR     Status: Abnormal   Collection Time: 01/21/22  2:18 AM  Result Value Ref Range   Prothrombin Time 41.9 (H) 11.4 - 15.2 seconds   INR 4.4 (HH) 0.8 - 1.2    Comment: REPEATED TO VERIFY CRITICAL RESULT CALLED TO, READ BACK BY AND VERIFIED WITH: 01/23/22 [facility] RN AT [phone] BY RAMEL CUENCA (NOTE) INR goal varies based on device and disease states. Performed at [facility], [address], [place], [place]   Heparin level (unfractionated)     Status: Abnormal   Collection Time: 01/21/22  4:21 PM  Result Value Ref Range   Heparin Unfractionated 0.19 (L) 0.30 - 0.70 IU/mL    Comment: (NOTE) The clinical reportable range upper limit is being lowered to >1.10 to align with the FDA approved guidance for the current laboratory assay.  If heparin results are below expected values, and patient dosage has  been confirmed, suggest follow up testing of  antithrombin III levels. Performed at [facility], [address] [place], [place] [id number]    CT VENOGRAM ABD/PEL  Result Date: 01/21/2022 CLINICAL DATA:  Deep venous thrombosis, evaluate iliac veins and IVC EXAM: CT VENOGRAM ABDOMEN AND PELVIS TECHNIQUE: RADIATION DOSE REDUCTION: This exam was performed according to the departmental dose-optimization program which includes automated exposure control, adjustment of the mA and/or kV according to patient size and/or use of iterative reconstruction technique. CONTRAST:  125mL OMNIPAQUE IOHEXOL 350 MG/ML SOLN COMPARISON:  CT done on 03/31/2021 FINDINGS: There are intraluminal filling defects in right common femoral vein and right external iliac vein. Rest of the major venous structures appear patent. There are no filling defects seen inferior vena cava. Aorta is of normal caliber. Major branches of the aorta appear patent. Visualized lower lung fields are clear. There is no pleural effusion. No focal abnormalities are seen in liver. There is no dilation of bile ducts. Gallbladder is unremarkable. No focal abnormalities are seen in the pancreas. Spleen is unremarkable. Adrenals are unremarkable. There is no hydronephrosis. Urinary bladder is unremarkable. Stomach is unremarkable. Small bowel loops are not dilated. Appendix is not distinctly seen. There is no significant wall thickening in colon. There is 4.5 cm low density structure in the left adnexa.  There is slightly increased attenuation in the posterior aspect of this lesion suggesting possible hemorrhagic functional left ovarian cyst. There is slightly inhomogeneous enhancement in myometrium. There is no pneumoperitoneum. There are tortuous ectatic veins in subcutaneous plane in the right anterior abdominal wall extending to the right common femoral vein. There is possible occlusive DVT in the right common femoral vein. Visualized bony structures are unremarkable. IMPRESSION: Intraluminal filling defects are seen in right common femoral vein and right external iliac vein. There is a prominent tortuous venous structure in subcutaneous plane in the right anterior abdominal wall extending to the right common femoral vein. Findings suggest possible occlusive DVT in the right common femoral vein. Rest of the major deep veins in the abdomen and pelvis including the inferior vena cava appear to be patent without intraluminal defects. There is no evidence of intestinal obstruction or pneumoperitoneum. There is no hydronephrosis. There is 4.5 cm smoothly marginated lesion in the left adnexa, possibly hemorrhagic functional left ovarian cyst. If clinically warranted, follow-up pelvic sonogram may be considered. Electronically Signed   By: Ernie AvenaPalani  Rathinasamy M.D.   On: 01/21/2022 12:47   VAS US LOWER EXTREMITY VENOUS (DVT) (7a-7p)  Result Date: 01/21/2022  Lower Venous DVT Study Patient Name:  Armanda MagicISSATOU Cowell  Date of Exam:   01/20/2022 Medical Rec #: [id number]       Accession #:    [id number] Date of Birth: 06/27/1991       Patient Gender: F Patient Age:   [age] years [facility] Procedure:      VAS US LOWER EXTREMITY VENOUS (DVT) Referring Phys: Army MeliaLAURA MURPHY --------------------------------------------------------------------------------  Indications: Swelling.  Risk Factors: Surgery bilateral vena cava, iliac, femoral endovascular thrombectomy and right common iliac vein angioplasty 03/24/21.  Anticoagulation: Coumadin (patient admitted she is not fully compliant on medication). Comparison Study: Prior left lower extremity 04/01/21 demonstrated acute DVT in                   the femoral and PTV veins as well as age indeterminate DVT in                   the right common femoral vein. Performing  Technologist: Sherren Kerns RVS  Examination Guidelines: A complete evaluation includes B-mode imaging, spectral Doppler, color Doppler, and power Doppler as needed of all accessible portions of each vessel. Bilateral testing is considered an integral part of a complete examination. Limited examinations for reoccurring indications may be performed as noted. The reflux portion of the exam is performed with the patient in reverse Trendelenburg.  +----------+---------------+---------+-----------+----------+-----------------+ RIGHT     CompressibilityPhasicitySpontaneityPropertiesThrombus Aging    +----------+---------------+---------+-----------+----------+-----------------+ CFV       None           No       No                   Acute             +----------+---------------+---------+-----------+----------+-----------------+ SFJ       Full                                                           +----------+---------------+---------+-----------+----------+-----------------+ FV Prox   None           No       No                   Acute             +----------+---------------+---------+-----------+----------+-----------------+ FV Mid    None           No       No                   Acute             +----------+---------------+---------+-----------+----------+-----------------+ FV Distal None           No       No                   Acute             +----------+---------------+---------+-----------+----------+-----------------+ PFV       None           No       No                   Acute              +----------+---------------+---------+-----------+----------+-----------------+ POP       Partial        No       No                   Age Indeterminate +----------+---------------+---------+-----------+----------+-----------------+ PTV       Full                                                           +----------+---------------+---------+-----------+----------+-----------------+ PERO      Full                                                           +----------+---------------+---------+-----------+----------+-----------------+  Gastroc   Full                                                           +----------+---------------+---------+-----------+----------+-----------------+ CIV Distal               No       No                   Acute             +----------+---------------+---------+-----------+----------+-----------------+ CIV mid                  No       No                   Acute             +----------+---------------+---------+-----------+----------+-----------------+ Proximal CIV and IVC appear patent by color and Doppler  +----------+---------------+---------+-----------+----------+--------------+ LEFT      CompressibilityPhasicitySpontaneityPropertiesThrombus Aging +----------+---------------+---------+-----------+----------+--------------+ CFV       Full           Yes                                          +----------+---------------+---------+-----------+----------+--------------+ CIV distal                                                            +----------+---------------+---------+-----------+----------+--------------+ CIV mid                                                               +----------+---------------+---------+-----------+----------+--------------+     Summary: RIGHT: - Findings consistent with acute deep vein thrombosis involving the right , Mid to distal common iliac vein, common femoral vein, right  femoral vein, and right proximal profunda vein. The proximal common iliac and IVC appear patent by color and Doppler - Findings consistent with age indeterminate deep vein thrombosis involving the right popliteal vein.   *See table(s) above for measurements and observations. Electronically signed by Gerarda Fraction on 01/21/2022 at 11:15:49 AM.    Final    CT Angio Chest PE W and/or Wo Contrast  Result Date: 01/21/2022 CLINICAL DATA:  Right leg swelling, elevated D-dimer, evaluate for PE EXAM: CT ANGIOGRAPHY CHEST WITH CONTRAST TECHNIQUE: Multidetector CT imaging of the chest was performed using the standard protocol during bolus administration of intravenous contrast. Multiplanar CT image reconstructions and MIPs were obtained to evaluate the vascular anatomy. RADIATION DOSE REDUCTION: This exam was performed according to the departmental dose-optimization program which includes automated exposure control, adjustment of the mA and/or kV according to patient size and/or use of iterative reconstruction technique. CONTRAST:  96mL OMNIPAQUE IOHEXOL 350 MG/ML SOLN COMPARISON:  CTA chest dated 08/02/2021 FINDINGS: Cardiovascular: Satisfactory opacification of the bilateral pulmonary arteries to the segmental level. No evidence of  pulmonary embolism. Although not tailored for evaluation of the thoracic aorta, there is no evidence thoracic aortic aneurysm or dissection. The heart is top-normal in size. Mediastinum/Nodes: No suspicious mediastinal lymphadenopathy. Visualized thyroid is unremarkable. Lungs/Pleura: Lungs are clear. No focal consolidation. No suspicious pulmonary nodules. No pleural effusion or pneumothorax. Upper Abdomen: Visualized upper abdomen is grossly unremarkable. Musculoskeletal: Visualized osseous structures are within normal limits. Review of the MIP images confirms the above findings. IMPRESSION: No evidence of pulmonary embolism. Negative CT chest. Electronically Signed   By: Charline Bills  M.D.   On: 01/21/2022 02:59    Pending Labs Unresulted Labs (From admission, onward)     Start     Ordered   01/22/22 0500  Protime-INR  Daily at 5am,   R      01/21/22 0511   01/22/22 0500  CBC  Daily at 5am,   R      01/21/22 0511   01/22/22 0500  Heparin level (unfractionated)  Daily at 5am,   R      01/21/22 0905   01/22/22 0000  Heparin level (unfractionated)  Once-Timed,   TIMED        01/21/22 1730   01/21/22 0639  HIV Antibody (routine testing w rflx)  (HIV Antibody (Routine testing w reflex) panel)  Once,   R        01/21/22 0640            Vitals/Pain Today's Vitals   01/21/22 1113 01/21/22 1529 01/21/22 1634 01/21/22 1804  BP: 104/70 101/79  129/89  Pulse: 68 72  88  Resp: 18 16  16   Temp: 97.6 F (36.4 C) 98.4 F (36.9 C)  98.2 F (36.8 C)  TempSrc:  Oral  Oral  SpO2: 100% 100%  100%  PainSc:   10-Worst pain ever     Isolation Precautions No active isolations  Medications Medications  acetaminophen (TYLENOL) tablet 650 mg (650 mg Oral Given 01/21/22 1223)    Or  acetaminophen (TYLENOL) suppository 650 mg ( Rectal See Alternative 01/21/22 1223)  heparin ADULT infusion 100 units/mL (25000 units/227mL) (1,050 Units/hr Intravenous Infusion Verify 01/21/22 1800)  iohexol (OMNIPAQUE) 350 MG/ML injection 75 mL (75 mLs Intravenous Contrast Given 01/21/22 0243)  iohexol (OMNIPAQUE) 350 MG/ML injection 125 mL (125 mLs Intravenous Contrast Given 01/21/22 1201)  phytonadione (VITAMIN K) tablet 10 mg (10 mg Oral Given 01/21/22 1759)    Mobility walks Low fall risk     R Recommendations: See Admitting Provider Note  Report given to:   Additional Notes:

## 2022-01-21 NOTE — Progress Notes (Addendum)
ANTICOAGULATION CONSULT NOTE - Initial Consult  Pharmacy Consult for heparin  Indication: VTE Treatment  No Known Allergies  Patient Measurements:   Heparin Dosing Weight: 64 Kg  Vital Signs: Temp: 98.2 F (36.8 C) (07/23 0004) Temp Source: Oral (07/23 0004) BP: 122/88 (07/23 0004) Pulse Rate: 78 (07/23 0004)  Labs: Recent Labs    01/20/22 1903 01/21/22 0218  HGB 12.8  --   HCT 41.4  --   PLT 344  --   LABPROT 42.9* 41.9*  INR 4.6* 4.4*  CREATININE 0.64  --     Estimated Creatinine Clearance: 91.7 mL/min (by C-G formula based on SCr of 0.64 mg/dL).   Medical History: Past Medical History:  Diagnosis Date   Stroke [facility]      Assessment: 31 y.o. female presents to the ED with complaint of right leg swelling. Swelling over last week, she has hx of DVT on coumadin. Did miss 3-4 doses over the last 2 weeks. She called warfarin  clinic and they advised her to take extra dose on 7/20 to make up for missed doses totaling 15 mg. Last dose of warfarin PTA Friday 7/21 @ 4pm. Pharmacy consulted to start heparin infusion, but given INR 4.4, will wait until INR is below goal. Daily warfarin dose is 10mg  daily. CBC WNL, no overt bruising or bleeding reported   Goal of Therapy:  INR 2-3 Heparin level 0.3-0.7 units/ml Monitor platelets by anticoagulation protocol: Yes   Plan:  Monitor INR and start heparin infusion when appropriate  Daily INR and CBC  , PharmD Clinical Pharmacist 01/21/2022 5:03 AM Please check AMION for all [facility] Pharmacy numbers

## 2022-01-21 NOTE — ED Notes (Signed)
Pt being wheeled to bathroom in [facility]

## 2022-01-21 NOTE — H&P (Signed)
History and Physical    Andrea Hayes NLZ:[id number] DOB: 09-18-1990 DOA: 01/20/2022  PCP: Fleet Contras, MD  Patient coming from: Home  Chief Complaint: Right leg swelling  HPI: Andrea Hayes is a 31 y.o. 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  Failed Xarelto in the past and was switched to warfarin.  Admitted last year September/October for extensive right lower extremity DVT and underwent mechanical thrombectomy.  She presents to the ED with complaint of right leg swelling.  Vital signs stable.  Labs significant for WBC 5.3, hemoglobin 12.8, platelet count 344k.  Sodium 137, potassium 4.1, chloride 104, bicarb 27, BUN 7, creatinine 0.6, glucose 90.  INR 4.6 >4.4.  Right lower extremity Doppler showing acute DVT involving the right mid to distal common iliac vein, common femoral vein, right femoral vein, and right proximal profunda vein.  Also showing age-indeterminate DVT involving the right popliteal vein.  CTA chest negative for PE.  Pharmacy consulted for IV heparin.  Patient reports 1 week history of right lower extremity swelling but no pain.  She takes warfarin 10 mg daily except 7.5 mg on Fridays.  Does report missing 3 doses of warfarin last week.  Denies shortness of breath or chest pain.  No other complaints.  Review of Systems:  Review of Systems  All other systems reviewed and are negative.   Past Medical History:  Diagnosis Date   Stroke [facility])     Past Surgical History:  Procedure Laterality Date   BIOPSY  04/05/2021   Procedure: BIOPSY;  Surgeon: Jenel Lucks, MD;  Location: [facility] ENDOSCOPY;  Service: Gastroenterology;;   COLONOSCOPY N/A  04/05/2021   Procedure: COLONOSCOPY;  Surgeon: Jenel Lucks, MD;  Location: [facility] ENDOSCOPY;  Service: Gastroenterology;  Laterality: N/A;   ESOPHAGOGASTRODUODENOSCOPY N/A 04/05/2021   Procedure: ESOPHAGOGASTRODUODENOSCOPY (EGD);  Surgeon: Jenel Lucks, MD;  Location: [facility] ENDOSCOPY;  Service: Gastroenterology;  Laterality: N/A;   NO PAST SURGERIES     PERIPHERAL VASCULAR BALLOON ANGIOPLASTY  03/24/2021   Procedure: PERIPHERAL VASCULAR BALLOON ANGIOPLASTY;  Surgeon: Leonie Douglas, MD;  Location: [facility] INVASIVE CV LAB;  Service: Cardiovascular;;  bilateral common iliacs   PERIPHERAL VASCULAR THROMBECTOMY N/A 03/23/2021   Procedure: PERIPHERAL VASCULAR THROMBECTOMY;  Surgeon: Maeola Harman, MD;  Location: [facility] INVASIVE CV LAB;  Service: Cardiovascular;  Laterality: N/A;   PERIPHERAL VASCULAR THROMBECTOMY N/A 03/24/2021   Procedure: LYSIS RECHECK;  Surgeon: Leonie Douglas, MD;  Location: [facility] INVASIVE CV LAB;  Service: Cardiovascular;  Laterality: N/A;     reports that she has never smoked. She has never used smokeless tobacco. She reports that she does not drink alcohol and does not use drugs.  No Known Allergies  Family History  Problem Relation Age of Onset   Asthma Father    Obesity Neg Hx    Hypertension Neg Hx    Diabetes Neg Hx    Cancer Neg Hx     Prior to Admission medications   Medication Sig Start Date End Date Taking? Authorizing Provider  acetaZOLAMIDE (DIAMOX) 125 MG tablet Take 2 tablets (250 mg total) by mouth 2 (two) times daily. Patient not taking: Reported on 06/06/2021 12/02/20   Drema Dallas, DO  acetaZOLAMIDE (DIAMOX) 250 MG tablet Take  500 mg by mouth 2 (two) times daily. Patient not taking: Reported on 06/09/2021 05/09/21   [provider]  benzonatate (TESSALON) 100 MG capsule Take 1 capsule (100 mg total) by mouth 3 (three) times daily. Patient not taking: Reported on 06/06/2021 04/08/21   Rolly Salter, MD  clotrimazole (CLOTRIMAZOLE 3) 2 %  vaginal cream Place 1 Applicatorful vaginally at bedtime. 05/11/21   Pollyann Samples, NP  docusate sodium (COLACE) 100 MG capsule Take 1 capsule (100 mg total) by mouth at bedtime. 04/08/21   Rolly Salter, MD  ferrous fumarate-b12-vitamic C-folic acid (TRINSICON / FOLTRIN) capsule Take 1 capsule by mouth 2 (two) times daily after a meal. 04/13/21   Pollyann Samples, NP  HYDROcodone-acetaminophen (NORCO/VICODIN) 5-325 MG tablet Take 1-2 tablets by mouth every 4 (four) hours as needed. 05/06/21   [provider]  metoprolol succinate (TOPROL-XL) 25 MG 24 hr tablet Take 1 tablet (25 mg total) by mouth daily. 03/27/21   Arnetha Courser, MD  polyethylene glycol (MIRALAX / GLYCOLAX) 17 g packet Take 17 g by mouth daily. 04/09/21   Rolly Salter, MD  Prenatal Vit-Fe Fumarate-FA (PRENATAL VITAMIN) 27-0.8 MG TABS Take 1 tablet by mouth daily. 04/19/20   Aviva Signs, CNM  Vitamin D, Ergocalciferol, (DRISDOL) 1.25 MG (50000 UNIT) CAPS capsule Take 50,000 Units by mouth every Wednesday. 02/24/21   [provider]  warfarin (COUMADIN) 10 MG tablet TAKE 1 TABLET BY MOUTH AS DIRECTED BY NP/MD 01/04/22   Pollyann Samples, NP    Physical Exam: Vitals:   01/20/22 2136 01/20/22 2349 01/21/22 0004 01/21/22 0530  BP: 113/87 122/88 122/88 115/85  Pulse: 80 78 78 74  Resp: 16  16 16   Temp:  98.2 F (36.8 C) 98.2 F (36.8 C) 97.6 F (36.4 C)  TempSrc:  Oral Oral   SpO2: 100% 95% 95% 100%    Physical Exam Vitals reviewed.  Constitutional:      General: She is not in acute distress. HENT:     Head: Normocephalic and atraumatic.  Eyes:     Extraocular Movements: Extraocular movements intact.  Cardiovascular:     Rate and Rhythm: Normal rate and regular rhythm.     Pulses: Normal pulses.  Pulmonary:     Effort: Pulmonary effort is normal. No respiratory distress.     Breath sounds: Normal breath sounds. No wheezing or rales.  Abdominal:     General: Bowel sounds are normal. There is no  distension.     Palpations: Abdomen is soft.     Tenderness: There is no abdominal tenderness.  Musculoskeletal:     Cervical back: Normal range of motion.     Right lower leg: Edema present.     Comments: Entire right lower extremity swollen.  Distal pulses intact and foot warm to touch.  Skin:    General: Skin is warm and dry.  Neurological:     General: No focal deficit present.     Mental Status: She is alert and oriented to person, place, and time.      Labs on Admission: I have personally reviewed following labs and imaging studies  CBC: Recent Labs  Lab 01/17/22 0834 01/20/22 1903  WBC 6.0 5.3  NEUTROABS 3.3 2.9  HGB 12.9 12.8  HCT 41.0 41.4  MCV 80.2 81.7  PLT 357 344   Basic Metabolic Panel: Recent Labs  Lab 01/20/22 1903  NA 137  K 4.1  CL 104  CO2 27  GLUCOSE  90  BUN 7  CREATININE 0.64  CALCIUM 9.4   GFR: Estimated Creatinine Clearance: 91.7 mL/min (by C-G formula based on SCr of 0.64 mg/dL). Liver Function Tests: No results for input(s): "AST", "ALT", "ALKPHOS", "BILITOT", "PROT", "ALBUMIN" in the last 168 hours. No results for input(s): "LIPASE", "AMYLASE" in the last 168 hours. No results for input(s): "AMMONIA" in the last 168 hours. Coagulation Profile: Recent Labs  Lab 01/17/22 0834 01/20/22 1903 01/21/22 0218  INR 2.2* 4.6* 4.4*   Cardiac Enzymes: No results for input(s): "CKTOTAL", "CKMB", "CKMBINDEX", "TROPONINI" in the last 168 hours. BNP (last 3 results) No results for input(s): "PROBNP" in the last 8760 hours. HbA1C: No results for input(s): "HGBA1C" in the last 72 hours. CBG: No results for input(s): "GLUCAP" in the last 168 hours. Lipid Profile: No results for input(s): "CHOL", "HDL", "LDLCALC", "TRIG", "CHOLHDL", "LDLDIRECT" in the last 72 hours. Thyroid Function Tests: No results for input(s): "TSH", "T4TOTAL", "FREET4", "T3FREE", "THYROIDAB" in the last 72 hours. Anemia Panel: No results for input(s): "VITAMINB12",  "FOLATE", "FERRITIN", "TIBC", "IRON", "RETICCTPCT" in the last 72 hours. Urine analysis:    Component Value Date/Time   COLORURINE YELLOW 04/01/2021 0008   APPEARANCEUR CLEAR 04/01/2021 0008   LABSPEC 1.039 (H) 04/01/2021 0008   PHURINE 6.0 04/01/2021 0008   GLUCOSEU NEGATIVE 04/01/2021 0008   HGBUR NEGATIVE 04/01/2021 0008   BILIRUBINUR NEGATIVE 04/01/2021 0008   BILIRUBINUR negaive 10/17/2020 1617   KETONESUR NEGATIVE 04/01/2021 0008   PROTEINUR 100 (A) 04/01/2021 0008   UROBILINOGEN 0.2 10/17/2020 1617   UROBILINOGEN 0.2 10/23/2012 1135   NITRITE NEGATIVE 04/01/2021 0008   LEUKOCYTESUR NEGATIVE 04/01/2021 0008    Radiological Exams on Admission: I have personally reviewed images CT Angio Chest PE W and/or Wo Contrast  Result Date: 01/21/2022 CLINICAL DATA:  Right leg swelling, elevated D-dimer, evaluate for PE EXAM: CT ANGIOGRAPHY CHEST WITH CONTRAST TECHNIQUE: Multidetector CT imaging of the chest was performed using the standard protocol during bolus administration of intravenous contrast. Multiplanar CT image reconstructions and MIPs were obtained to evaluate the vascular anatomy. RADIATION DOSE REDUCTION: This exam was performed according to the departmental dose-optimization program which includes automated exposure control, adjustment of the mA and/or kV according to patient size and/or use of iterative reconstruction technique. CONTRAST:  63mL OMNIPAQUE IOHEXOL 350 MG/ML SOLN COMPARISON:  CTA chest dated 08/02/2021 FINDINGS: Cardiovascular: Satisfactory opacification of the bilateral pulmonary arteries to the segmental level. No evidence of pulmonary embolism. Although not tailored for evaluation of the thoracic aorta, there is no evidence thoracic aortic aneurysm or dissection. The heart is top-normal in size. Mediastinum/Nodes: No suspicious mediastinal lymphadenopathy. Visualized thyroid is unremarkable. Lungs/Pleura: Lungs are clear. No focal consolidation. No suspicious  pulmonary nodules. No pleural effusion or pneumothorax. Upper Abdomen: Visualized upper abdomen is grossly unremarkable. Musculoskeletal: Visualized osseous structures are within normal limits. Review of the MIP images confirms the above findings. IMPRESSION: No evidence of pulmonary embolism. Negative CT chest. Electronically Signed   By: Charline Bills M.D.   On: 01/21/2022 02:59   VAS [place] LOWER EXTREMITY VENOUS (DVT) (7a-7p)  Result Date: 01/20/2022  Lower Venous DVT Study Patient Name:  Andrea Hayes  Date of Exam:   01/20/2022 Medical Rec #: [id number]       Accession #:    [id number] Date of Birth: 08/11/1990       Patient Gender: F Patient Age:   19 years Exam Location:  [facility] Procedure:      VAS [place] LOWER EXTREMITY  VENOUS (DVT) Referring Phys: Vernona RiegerLAURA MURPHY --------------------------------------------------------------------------------  Indications: Swelling.  Risk Factors: Surgery bilateral vena cava, iliac, femoral endovascular thrombectomy and right common iliac vein angioplasty 03/24/21. Anticoagulation: Coumadin (patient admitted she is not fully compliant on medication). Comparison Study: Prior left lower extremity 04/01/21 demonstrated acute DVT in                   the femoral and PTV veins as well as age indeterminate DVT in                   the right common femoral vein. Performing Technologist: Sherren Kernsandace Kanady RVS  Examination Guidelines: A complete evaluation includes B-mode imaging, spectral Doppler, color Doppler, and power Doppler as needed of all accessible portions of each vessel. Bilateral testing is considered an integral part of a complete examination. Limited examinations for reoccurring indications may be performed as noted. The reflux portion of the exam is performed with the patient in reverse Trendelenburg.  +----------+---------------+---------+-----------+----------+-----------------+ RIGHT     CompressibilityPhasicitySpontaneityPropertiesThrombus Aging     +----------+---------------+---------+-----------+----------+-----------------+ CFV       None           No       No                   Acute             +----------+---------------+---------+-----------+----------+-----------------+ SFJ       Full                                                           +----------+---------------+---------+-----------+----------+-----------------+ FV Prox   None           No       No                   Acute             +----------+---------------+---------+-----------+----------+-----------------+ FV Mid    None           No       No                   Acute             +----------+---------------+---------+-----------+----------+-----------------+ FV Distal None           No       No                   Acute             +----------+---------------+---------+-----------+----------+-----------------+ PFV       None           No       No                   Acute             +----------+---------------+---------+-----------+----------+-----------------+ POP       Partial        No       No                   Age Indeterminate +----------+---------------+---------+-----------+----------+-----------------+ PTV       Full                                                           +----------+---------------+---------+-----------+----------+-----------------+  PERO      Full                                                           +----------+---------------+---------+-----------+----------+-----------------+ Gastroc   Full                                                           +----------+---------------+---------+-----------+----------+-----------------+ CIV Distal               No       No                   Acute             +----------+---------------+---------+-----------+----------+-----------------+ CIV mid                  No       No                   Acute              +----------+---------------+---------+-----------+----------+-----------------+ Proximal CIV and IVC appear patent by color and Doppler  +----------+---------------+---------+-----------+----------+--------------+ LEFT      CompressibilityPhasicitySpontaneityPropertiesThrombus Aging +----------+---------------+---------+-----------+----------+--------------+ CFV       Full           Yes                                          +----------+---------------+---------+-----------+----------+--------------+ CIV distal                                                            +----------+---------------+---------+-----------+----------+--------------+ CIV mid                                                               +----------+---------------+---------+-----------+----------+--------------+     Summary: RIGHT: - Findings consistent with acute deep vein thrombosis involving the right , Mid to distal common iliac vein, common femoral vein, right femoral vein, and right proximal profunda vein. The proximal common iliac and IVC appear patent by color and Doppler - Findings consistent with age indeterminate deep vein thrombosis involving the right popliteal vein.   *See table(s) above for measurements and observations.    Preliminary     Assessment and Plan  Recurrent extensive DVT of right lower extremity Suspect this is due to variable compliance with warfarin, does admit to missing doses last week and then started taking it again. Right lower extremity is swollen but not painful.  Distal pulses intact and foot warm to touch.  INR supratherapeutic 4.6> 4.4. Right lower extremity Doppler showing acute DVT involving the right mid to distal  common iliac vein, common femoral vein, right femoral vein, and right proximal profunda vein.  Also showing age-indeterminate DVT involving the right popliteal vein.  CTA chest negative for PE. -Hold warfarin and monitor INR, pharmacy following and  will start IV heparin once INR <2 -May need mechanical thrombectomy again given extent of DVT.  Consult vascular surgery in the morning. -Monitor closely for signs of ischemia/compartment syndrome  History of venous sinus thrombosis -Plan for anticoagulation listed above  Idiopathic intracranial hypertension Essential hypertension -Pharmacy med rec pending.  Code Status: Full Code Family Communication: No family available at this time. Level of care: Telemetry bed Admission status: It is my clinical opinion that referral for OBSERVATION is reasonable and necessary in this patient based on the above information provided. The aforementioned taken together are felt to place the patient at high risk for further clinical deterioration. However, it is anticipated that the patient may be medically stable for discharge from the hospital within 24 to 48 hours.   John Giovanni MD [facility]  If 7PM-7AM, please contact night-coverage [email or web address]  01/21/2022, 6:17 AM

## 2022-01-21 NOTE — Progress Notes (Signed)
ANTICOAGULATION CONSULT NOTE - Initial Consult  Pharmacy Consult for heparin Indication: DVT  No Known Allergies  Patient Measurements:   Heparin Dosing Weight: 65 kg  Vital Signs: Temp: 97.6 F (36.4 C) (07/23 0530) Temp Source: Oral (07/23 0004) BP: 115/85 (07/23 0530) Pulse Rate: 74 (07/23 0530)  Labs: Recent Labs    01/20/22 1903 01/21/22 0218  HGB 12.8  --   HCT 41.4  --   PLT 344  --   LABPROT 42.9* 41.9*  INR 4.6* 4.4*  CREATININE 0.64  --     Estimated Creatinine Clearance: 91.7 mL/min (by C-G formula based on SCr of 0.64 mg/dL).   Medical History: Past Medical History:  Diagnosis Date   Stroke [facility])      Assessment: 31 y.o. 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 She presents to the ED with complaint of right leg swelling. Found to have recurrent extensive DVT in right lower extremity despite Coumadin therapy. Pharmacy consulted to start heparin drip.  INR 4.4 - Asked to start heparin drip despite elevated INR.  Goal of Therapy:  Heparin level 0.3-0.7 units/ml Monitor platelets by anticoagulation protocol: Yes   Plan:  Start heparin infusion at 900 units/hr (no bolus due to elevated INR) Check anti-Xa level in 6 hours and daily while on heparin Continue to monitor H&H and platelets  Jeanella Cara, PharmD, [facility] Clinical Pharmacist Please see AMION for all Pharmacists' Contact Phone Numbers 01/21/2022, 9:08 AM

## 2022-01-21 NOTE — ED Notes (Signed)
Robins MD at bedside; notified CT about new IV placement for scans

## 2022-01-21 NOTE — Progress Notes (Signed)
ANTICOAGULATION CONSULT NOTE   Pharmacy Consult for heparin Indication: DVT  No Known Allergies  Patient Measurements:   Heparin Dosing Weight: 65 kg  Vital Signs: Temp: 98.4 F (36.9 C) (07/23 1529) Temp Source: Oral (07/23 1529) BP: 101/79 (07/23 1529) Pulse Rate: 72 (07/23 1529)  Labs: Recent Labs    01/20/22 1903 01/21/22 0218 01/21/22 1621  HGB 12.8  --   --   HCT 41.4  --   --   PLT 344  --   --   LABPROT 42.9* 41.9*  --   INR 4.6* 4.4*  --   HEPARINUNFRC  --   --  0.19*  CREATININE 0.64  --   --      Estimated Creatinine Clearance: 91.7 mL/min (by C-G formula based on SCr of 0.64 mg/dL).   Medical History: Past Medical History:  Diagnosis Date   Stroke [facility])      Assessment: 31 y.o. 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 She presents to the ED with complaint of right leg swelling. Found to have recurrent extensive DVT in right lower extremity despite Coumadin therapy. Pharmacy consulted to start heparin drip. INR elevated to 4.4 - Asked to start heparin drip despite elevated INR.  Heparin level subtherapeutic (0.19) on infusion at 900 units/hr. Per VVS, plan for repeat percutaneous thrombectomy 7/24 if INR down - giving vit K 10mg  tablet today.  Goal of Therapy:  Heparin level 0.3-0.7 units/ml Monitor platelets by anticoagulation protocol: Yes   Plan:  Increase heparin infusion to 1050 units/hr  Check anti-Xa level in 6 hours   , PharmD, BCPS Please see amion for complete clinical pharmacist phone list 01/21/2022, 5:24 PM

## 2022-01-22 ENCOUNTER — Other Ambulatory Visit: Payer: Self-pay

## 2022-01-22 ENCOUNTER — Other Ambulatory Visit: Payer: Self-pay | Admitting: Hematology

## 2022-01-22 ENCOUNTER — Encounter (HOSPITAL_COMMUNITY)
Admission: EM | Disposition: A | Discharge status: Home / Self Care | Payer: Self-pay | Source: Home / Self Care | Attending: Family Medicine

## 2022-01-22 ENCOUNTER — Encounter (HOSPITAL_COMMUNITY): Payer: Self-pay | Admitting: Internal Medicine

## 2022-01-22 ENCOUNTER — Other Ambulatory Visit (HOSPITAL_COMMUNITY): Payer: Self-pay

## 2022-01-22 ENCOUNTER — Telehealth (HOSPITAL_COMMUNITY): Payer: Self-pay | Admitting: Pharmacy Technician

## 2022-01-22 ENCOUNTER — Encounter (HOSPITAL_COMMUNITY): Payer: Self-pay | Admitting: *Deleted

## 2022-01-22 DIAGNOSIS — I10 Essential (primary) hypertension: Secondary | ICD-10-CM | POA: Diagnosis not present

## 2022-01-22 DIAGNOSIS — G08 Intracranial and intraspinal phlebitis and thrombophlebitis: Secondary | ICD-10-CM | POA: Diagnosis not present

## 2022-01-22 DIAGNOSIS — I82421 Acute embolism and thrombosis of right iliac vein: Secondary | ICD-10-CM | POA: Diagnosis not present

## 2022-01-22 HISTORY — PX: LOWER EXTREMITY VENOGRAPHY: CATH118253

## 2022-01-22 LAB — HEPARIN LEVEL (UNFRACTIONATED)
Heparin Unfractionated: 0.32 IU/mL (ref 0.30–0.70)
Heparin Unfractionated: 0.51 IU/mL (ref 0.30–0.70)
Heparin Unfractionated: 0.54 IU/mL (ref 0.30–0.70)

## 2022-01-22 LAB — CBC
HCT: 39.8 % (ref 36.0–46.0)
Hemoglobin: 12.3 g/dL (ref 12.0–15.0)
MCH: 25.2 pg — ABNORMAL LOW (ref 26.0–34.0)
MCHC: 30.9 g/dL (ref 30.0–36.0)
MCV: 81.4 fL (ref 80.0–100.0)
Platelets: 284 10*3/uL (ref 150–400)
RBC: 4.89 MIL/uL (ref 3.87–5.11)
RDW: 16.7 % — ABNORMAL HIGH (ref 11.5–15.5)
WBC: 5.7 10*3/uL (ref 4.0–10.5)
nRBC: 0 % (ref 0.0–0.2)

## 2022-01-22 LAB — PROTIME-INR
INR: 1.3 — ABNORMAL HIGH (ref 0.8–1.2)
Prothrombin Time: 16.4 seconds — ABNORMAL HIGH (ref 11.4–15.2)

## 2022-01-22 LAB — APTT: aPTT: 81 seconds — ABNORMAL HIGH (ref 24–36)

## 2022-01-22 LAB — HCG, SERUM, QUALITATIVE: Preg, Serum: NEGATIVE

## 2022-01-22 SURGERY — LOWER EXTREMITY VENOGRAPHY
Anesthesia: LOCAL | Laterality: Right

## 2022-01-22 MED ORDER — FENTANYL CITRATE (PF) 100 MCG/2ML IJ SOLN
INTRAMUSCULAR | Status: AC
  Filled 2022-01-22: qty 2

## 2022-01-22 MED ORDER — LABETALOL HCL 5 MG/ML IV SOLN: 10.0000 mg | INTRAVENOUS | Status: DC | PRN

## 2022-01-22 MED ORDER — SODIUM CHLORIDE 0.9% FLUSH: 3.0000 mL | INTRAVENOUS | Status: DC | PRN

## 2022-01-22 MED ORDER — LIDOCAINE HCL (PF) 1 % IJ SOLN
INTRAMUSCULAR | Status: DC | PRN
  Administered 2022-01-22: 10 mL

## 2022-01-22 MED ORDER — SODIUM CHLORIDE 0.9 % IV SOLN: INTRAVENOUS | Status: DC

## 2022-01-22 MED ORDER — ONDANSETRON HCL 4 MG/2ML IJ SOLN: 4.0000 mg | Freq: Four times a day (QID) | INTRAMUSCULAR | Status: DC | PRN

## 2022-01-22 MED ORDER — ACETAMINOPHEN 325 MG PO TABS: 650.0000 mg | ORAL_TABLET | ORAL | Status: DC | PRN

## 2022-01-22 MED ORDER — ENOXAPARIN SODIUM 100 MG/ML IJ SOSY
100.0000 mg | PREFILLED_SYRINGE | Freq: Once | INTRAMUSCULAR | Status: AC
Start: 2022-01-22 — End: 2022-01-22
  Administered 2022-01-22: 100 mg via SUBCUTANEOUS
  Filled 2022-01-22: qty 1

## 2022-01-22 MED ORDER — MIDAZOLAM HCL 2 MG/2ML IJ SOLN
INTRAMUSCULAR | Status: AC
  Filled 2022-01-22: qty 2

## 2022-01-22 MED ORDER — WARFARIN SODIUM 10 MG PO TABS: 10.0000 mg | ORAL_TABLET | Freq: Every day | ORAL | 2 refills | Status: DC

## 2022-01-22 MED ORDER — SODIUM CHLORIDE 0.9 % IV SOLN: 250.0000 mL | INTRAVENOUS | Status: DC | PRN

## 2022-01-22 MED ORDER — MIDAZOLAM HCL 2 MG/2ML IJ SOLN
INTRAMUSCULAR | Status: DC | PRN
  Administered 2022-01-22: 1 mg via INTRAVENOUS

## 2022-01-22 MED ORDER — HEPARIN (PORCINE) IN NACL 1000-0.9 UT/500ML-% IV SOLN
INTRAVENOUS | Status: AC
  Filled 2022-01-22: qty 500

## 2022-01-22 MED ORDER — IODIXANOL 320 MG/ML IV SOLN
INTRAVENOUS | Status: DC | PRN
  Administered 2022-01-22: 40 mL

## 2022-01-22 MED ORDER — ENOXAPARIN SODIUM 100 MG/ML IJ SOSY: 100.0000 mg | PREFILLED_SYRINGE | Freq: Every day | INTRAMUSCULAR | 0 refills | Status: DC

## 2022-01-22 MED ORDER — FENTANYL CITRATE (PF) 100 MCG/2ML IJ SOLN
INTRAMUSCULAR | Status: DC | PRN
  Administered 2022-01-22: 50 ug via INTRAVENOUS

## 2022-01-22 MED ORDER — SODIUM CHLORIDE 0.9 % WEIGHT BASED INFUSION
1.0000 mL/kg/h | INTRAVENOUS | Status: DC
Start: 2022-01-22 — End: 2022-01-22

## 2022-01-22 MED ORDER — HEPARIN (PORCINE) IN NACL 1000-0.9 UT/500ML-% IV SOLN
INTRAVENOUS | Status: DC | PRN
  Administered 2022-01-22: 500 mL

## 2022-01-22 MED ORDER — WARFARIN - PHARMACIST DOSING INPATIENT: Freq: Every day | Status: DC

## 2022-01-22 MED ORDER — LIDOCAINE HCL (PF) 1 % IJ SOLN
INTRAMUSCULAR | Status: AC
  Filled 2022-01-22: qty 30

## 2022-01-22 MED ORDER — HYDRALAZINE HCL 20 MG/ML IJ SOLN: 5.0000 mg | INTRAMUSCULAR | Status: DC | PRN

## 2022-01-22 MED ORDER — ENOXAPARIN SODIUM 80 MG/0.8ML IJ SOSY: 70.0000 mg | PREFILLED_SYRINGE | Freq: Two times a day (BID) | INTRAMUSCULAR | Status: DC

## 2022-01-22 MED ORDER — WARFARIN SODIUM 10 MG PO TABS
10.0000 mg | ORAL_TABLET | Freq: Once | ORAL | Status: AC
  Administered 2022-01-22: 10 mg via ORAL
  Filled 2022-01-22: qty 1

## 2022-01-22 MED ORDER — SODIUM CHLORIDE 0.9% FLUSH: 3.0000 mL | Freq: Two times a day (BID) | INTRAVENOUS | Status: DC

## 2022-01-22 SURGICAL SUPPLY — 19 items
BAG SNAP BAND KOVER 36X36 (MISCELLANEOUS) ×2 IMPLANT
CATH ANGIO 5F BER2 65CM (CATHETERS) ×1 IMPLANT
CATH QUICKCROSS SUPP .035X90CM (MICROCATHETER) ×1 IMPLANT
CATH VISIONS PV .035 IVUS (CATHETERS) IMPLANT
COVER DOME SNAP 22 D (MISCELLANEOUS) ×1 IMPLANT
GLIDEWIRE ADV .035X260CM (WIRE) ×2 IMPLANT
GLIDEWIRE NITREX 0.018X80X5 (WIRE) ×1
GUIDEWIRE ANGLED .035X150CM (WIRE) ×1 IMPLANT
GUIDEWIRE NITREX 0.018X80X5 (WIRE) IMPLANT
KIT MICROPUNCTURE NIT STIFF (SHEATH) ×2 IMPLANT
PROTECTION STATION PRESSURIZED (MISCELLANEOUS) ×2
SHEATH PINNACLE 9F 10CM (SHEATH) ×1 IMPLANT
SHEATH PROBE COVER 6X72 (BAG) ×1 IMPLANT
STATION PROTECTION PRESSURIZED (MISCELLANEOUS) IMPLANT
TRAY PV CATH (CUSTOM PROCEDURE TRAY) ×1 IMPLANT
WIRE BENTSON .035X145CM (WIRE) ×1 IMPLANT
WIRE G V18X300CM (WIRE) ×1 IMPLANT
WIRE ROSEN-J .035X180CM (WIRE) ×1 IMPLANT
WIRE TORQFLEX AUST .018X40CM (WIRE) ×1 IMPLANT

## 2022-01-22 NOTE — TOC Benefit Eligibility Note (Signed)
Patient Product/process development scientist completed.    The patient is currently admitted and upon discharge could be taking enoxaparin (Lovenox) 80 mg/0.8 ml injection.  The current 30 day co-pay is $0.00.   The patient is currently admitted and upon discharge could be taking enoxaparin (Lovenox) 100 mg/1 ml injection.  The current 30 day co-pay is $0.00.   The patient is insured through [employer] and [employer] [place] [employer]     Roland Earl, CPhT Pharmacy Patient Advocate Specialist [facility] Patient Advocate Team Direct Number: [phone]  Fax: [phone]

## 2022-01-22 NOTE — Discharge Instructions (Signed)
Andrea Hayes,  You have a new leg clot because of missed doses of Coumadin. Please do not miss further doses to minimize the risk of recurrent clots. You will discharge with Lovenox as well while your INR gets back to the goal range. Please follow-up with Dr. Mosetta Putt on Friday, July 28th for a repeat INR check.

## 2022-01-22 NOTE — Interval H&P Note (Signed)
History and Physical Interval Note:  01/22/2022 12:17 PM  Andrea Hayes  has presented today for surgery, with the diagnosis of clot.  The various methods of treatment have been discussed with the patient and family. After consideration of risks, benefits and other options for treatment, the patient has consented to  Procedure(s): PERIPHERAL VASCULAR THROMBECTOMY (Right) as a surgical intervention.  The patient's history has been reviewed, patient examined, no change in status, stable for surgery.  I have reviewed the patient's chart and labs.  Questions were answered to the patient's satisfaction.     Leonie Douglas

## 2022-01-22 NOTE — Discharge Summary (Signed)
Physician Discharge Summary   Patient: Andrea Hayes MRN: [id number] DOB: [birth year]  Admit date:     01/20/2022  Discharge date: 01/22/22  Discharge Physician: Jacquelin Hawking, MD   PCP: Fleet Contras, MD   Recommendations at discharge:  Follow-up with oncology on Friday, 7/28 for INR check Lovenox bridge  Discharge Diagnoses: Principal Problem:   Acute DVT (deep venous thrombosis) (HCC) Active Problems:   Dural venous sinus thrombosis   Idiopathic intracranial hypertension   HTN (hypertension)  Resolved Problems:   * No resolved hospital problems. *  Hospital Course: Andrea Hayes is a 31 y.o. 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 Patient presented secondary to acute right leg swelling and found to have acute DVT. Initial concern that she may have failed Coumadin, but patient has been non-adherent with regimen which is the likely etiology of acute DVT. Vascular surgery consulted and performed venogram but unable to perform intervention. Hematology with recommendations to resume home Coumadin dose and discharge on Lovenox bridge. Patient to follow-up with hematology for repeat INR check.  Assessment and Plan:  Recurrent DVT of right lower extremity DVT involves the right mid-distal common iliac, common femoral, right femoral, right proximal profunda veins. Possibly related to variable compliance with warfarin vs medication failure. On chart review, goal INR is 2.5-3.5. Chart history reveals INR appears to have not been at target goal. INR of 4.6 on presentation this admission. Hematology, Dr. Arbutus Ped consulted and recommended to start heparin IV despite elevated INR. 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 On further history, it appears patient actually did  miss doses of Coumadin and took extra Coumadin as compensation, which explains supratherapeutic INR> Patient received vitamin K for her vascular procedure. INR of 1.3 on day of discharge. Patient discharged with instructions for Coumadin 10 mg daily. Patient also discharged on Lovenox 100 mg daily for bridge. Plans for INR check on Friday, 7/28 with her hematology clinic.   History of intracranial hypertension Primary hypertension Patient follows with neurology. Previously on acetazolamide for which she has stopped taking. Per neurology recommendations on most recent note, plan for repeat ophthalmology evaluation prior to restarting. Patient is on metoprolol for which she is also no longer taking. Blood pressure is well controlled, currently.   History of venous sinus thrombosis Anticoagulation as mentioned above  Consultants: Vascular surgery, Hematology/Oncology Procedures performed: Lower extremity venogram (01/22/2022)  Disposition: Home Diet recommendation: Regular diet  DISCHARGE MEDICATION: Allergies as of 01/22/2022   No Known Allergies      Medication List     TAKE these medications    acetaminophen 500 MG tablet Commonly known as: TYLENOL Take 1,000 mg by mouth every 6 (six) hours as needed for mild pain.   acetaZOLAMIDE 125 MG tablet Commonly known as: DIAMOX Take 2 tablets (250 mg total) by mouth 2 (two) times daily.   cetirizine 10 MG tablet Commonly known as: ZYRTEC Take 10 mg by mouth daily as needed for allergies.   diclofenac Sodium 1 % Gel Commonly known as: VOLTAREN Apply 4 g topically 4 (four) times daily as needed for pain.   docusate sodium 100 MG capsule Commonly known as: COLACE Take 1 capsule (100 mg total) by mouth at bedtime. What changed:  when to take this reasons to take this   enoxaparin 100 MG/ML injection Commonly known as: LOVENOX Inject 1 mL (  100 mg total) into the skin at bedtime for 4 days. Start taking on: January 23, 2022    metoprolol succinate 25 MG 24 hr tablet Commonly known as: TOPROL-XL Take 1 tablet (25 mg total) by mouth daily.   polyethylene glycol 17 g packet Commonly known as: MIRALAX / GLYCOLAX Take 17 g by mouth daily. What changed:  when to take this reasons to take this   Prenatal Vitamin 27-0.8 MG Tabs Take 1 tablet by mouth daily.   tiZANidine 2 MG tablet Commonly known as: ZANAFLEX Take 2 mg by mouth 2 (two) times daily as needed for muscle spasms.   Vitamin D (Ergocalciferol) 1.25 MG (50000 UNIT) Caps capsule Commonly known as: DRISDOL Take 50,000 Units by mouth every 7 (seven) days. Saturday   warfarin 10 MG tablet Commonly known as: COUMADIN Take 1 tablet (10 mg total) by mouth at bedtime. Start taking on: January 23, 2022 What changed:  See the new instructions. Another medication with the same name was removed. Continue taking this medication, and follow the directions you see here.        Discharge Exam: BP 121/83 (BP Location: Right Arm)   Pulse 81   Temp 98.6 F (37 C) (Oral)   Resp 18   Ht 5\' 5"  (1.651 m)   Wt 69.2 kg   SpO2 97%   BMI 25.39 kg/m   General exam: Appears calm and comfortable Respiratory system: Clear to auscultation. Respiratory effort normal. Cardiovascular system: S1 & S2 heard, RRR. No murmurs, rubs, gallops or clicks. Gastrointestinal system: Abdomen is nondistended, soft and nontender. No organomegaly or masses felt. Normal bowel sounds heard. Central nervous system: Alert and oriented. No focal neurological deficits. Musculoskeletal: Right LE edema. No calf tenderness Skin: No cyanosis. No rashes Psychiatry: Judgement and insight appear normal. Mood & affect appropriate.   Condition at discharge: stable  The results of significant diagnostics from this hospitalization (including imaging, microbiology, ancillary and laboratory) are listed below for reference.   Imaging Studies: PERIPHERAL VASCULAR CATHETERIZATION  Result Date:  01/22/2022 DATE OF SERVICE: 01/22/2022  PATIENT:  Andrea Hayes  31 y.o. 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 Rande Brunt, MD  ASSISTANT: none  ANESTHESIA:   local and IV sedation  ESTIMATED BLOOD LOSS: minimal  LOCAL MEDICATIONS USED:  LIDOCAINE  COUNTS: confirmed correct.  PATIENT DISPOSITION:  PACU - hemodynamically stable.  Delay start of Pharmacological VTE agent (>24hrs) due to surgical blood loss or risk of bleeding: no  INDICATION FOR PROCEDURE: Adrena Ezelle is a 32 y.o. female with right lower extremity swelling and pain.  Imaging in the emergency department suggested recurrence of acute iliofemoral deep venous thrombosis. After careful discussion of risks, benefits, and alternatives the patient was offered venogram with possible thrombectomy. The patient understood and wished to proceed.  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 Microwire fragmented during access.  This was imaged and found to be in the subcutaneous tissue and not a thromboembolic risk so it was left in place. Ultimately able to gain access and cross the chronic occlusion in the popliteal vein. 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  There is an enormous collateral draining the leg through the anterior abdominal wall. I was not able to cross the lesion and so no intervention was performed.  DESCRIPTION OF PROCEDURE: After identification of the patient in the pre-operative holding area, the patient was transferred to the operating room. The patient was positioned supine on the operating room  table. Anesthesia was induced. The groins was prepped and draped in standard fashion. A surgical pause was performed confirming correct patient, procedure, and operative location.  The right popliteal space was anesthetized with subcutaneous injection of 1% lidocaine. Using ultrasound guidance, the right popliteal vein with micropuncture technique.  I was not able to easily thread a microwire.  An attempt to introduce the sheath, the wire fragmented.  Imaging taken of the access site showed the wire to be subcutaneous and not a thromboembolic threat.  I elected to leave the wire where it was.  I reattempted access with micropuncture technique.  Through this sheath I performed a venogram.  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  Using a series of wire exchanges I was able to cross this and establish a platform with an 8 [place] sheath.   I was able to navigate a Glidewire advantage into the common femoral vein.  Here catheter directed venography was performed.  A large collateral vein had developed across the anterior abdominal wall and was draining the leg.  This was larger than the common femoral vein.  The external iliac vein and common femoral vein were not opacifying with contrast.  I was able to select these veins and partially cross them.  I was not able to reenter the inferior vena cava centrally.  I elected to abandon our procedure here. All endovascular equipment was removed.  A figure-of-eight stitch was placed around the right popliteal space at her access site with 4-0 Monocryl.  This rendered the venotomy hemostatic.  A sterile bandage was applied  Conscious sedation was administered with the use of IV fentanyl and midazolam under continuous physician and nurse monitoring.  Heart rate, blood pressure, and oxygen saturation were continuously monitored.  Total sedation time was 37 minutes  Upon completion of the case instrument and sharps counts were confirmed correct. The patient was  transferred to the PACU in good condition. I was present for all portions of the procedure.  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  She needs anticoagulation indefinitely.  I do not think endovascular recanalization will be technically successful in the future.  Rande Brunt. Lenell Antu, MD [facility] Phone Number: [phone] 01/22/2022 12:14 PM   CT VENOGRAM ABD/PEL  Result Date: 01/21/2022 CLINICAL DATA:  Deep venous thrombosis, evaluate iliac veins and IVC EXAM: CT VENOGRAM ABDOMEN AND PELVIS TECHNIQUE: RADIATION DOSE REDUCTION: This exam was performed according to the departmental dose-optimization program which includes automated exposure control, adjustment of the mA and/or kV according to patient size and/or use of iterative reconstruction technique. CONTRAST:  OMNIPAQUE IOHEXOL 350 MG/ML SOLN COMPARISON:  CT done on 03/31/2021 FINDINGS: There are intraluminal filling defects in right common femoral vein and right external iliac vein. Rest of the major venous structures appear patent. There are no filling defects seen inferior vena cava. Aorta is of normal caliber. Major branches of the aorta appear patent. Visualized lower lung fields are clear. There is no pleural effusion. No focal abnormalities are seen in liver. There is no dilation of bile ducts. Gallbladder is unremarkable. No  focal abnormalities are seen in the pancreas. Spleen is unremarkable. Adrenals are unremarkable. There is no hydronephrosis. Urinary bladder is unremarkable. Stomach is unremarkable. Small bowel loops are not dilated. Appendix is not distinctly seen. There is no significant wall thickening in colon. There is 4.5 cm low density structure in the left adnexa. There is slightly increased attenuation in the posterior aspect of this lesion suggesting possible hemorrhagic functional left ovarian cyst. There is slightly inhomogeneous enhancement in  myometrium. There is no pneumoperitoneum. There are tortuous ectatic veins in subcutaneous plane in the right anterior abdominal wall extending to the right common femoral vein. There is possible occlusive DVT in the right common femoral vein. Visualized bony structures are unremarkable. IMPRESSION: Intraluminal filling defects are seen in right common femoral vein and right external iliac vein. There is a prominent tortuous venous structure in subcutaneous plane in the right anterior abdominal wall extending to the right common femoral vein. Findings suggest possible occlusive DVT in the right common femoral vein. Rest of the major deep veins in the abdomen and pelvis including the inferior vena cava appear to be patent without intraluminal defects. There is no evidence of intestinal obstruction or pneumoperitoneum. There is no hydronephrosis. There is 4.5 cm smoothly marginated lesion in the left adnexa, possibly hemorrhagic functional left ovarian cyst. If clinically warranted, follow-up pelvic sonogram may be considered. Electronically Signed   By: Ernie Avena M.D.   On: 01/21/2022 12:47   VAS [place] LOWER EXTREMITY VENOUS (DVT) (7a-7p)  Result Date: 01/21/2022  Lower Venous DVT Study Patient Name:  OCEAN KEARLEY  Date of Exam:   01/20/2022 Medical Rec #: [id number]       Accession #:    [id number] Date of Birth: 07/05/1990       Patient Gender: F Patient Age:   52 years Exam Location:  [facility] Procedure:      VAS [place] LOWER EXTREMITY VENOUS (DVT) Referring Phys: Army Melia --------------------------------------------------------------------------------  Indications: Swelling.  Risk Factors: Surgery bilateral vena cava, iliac, femoral endovascular thrombectomy and right common iliac vein angioplasty 03/24/21. Anticoagulation: Coumadin (patient admitted she is not fully compliant on medication). Comparison Study: Prior left lower extremity 04/01/21 demonstrated acute DVT in                   the  femoral and PTV veins as well as age indeterminate DVT in                   the right common femoral vein. Performing Technologist: Sherren Kerns RVS  Examination Guidelines: A complete evaluation includes B-mode imaging, spectral Doppler, color Doppler, and power Doppler as needed of all accessible portions of each vessel. Bilateral testing is considered an integral part of a complete examination. Limited examinations for reoccurring indications may be performed as noted. The reflux portion of the exam is performed with the patient in reverse Trendelenburg.  +----------+---------------+---------+-----------+----------+-----------------+ RIGHT     CompressibilityPhasicitySpontaneityPropertiesThrombus Aging    +----------+---------------+---------+-----------+----------+-----------------+ CFV       None           No       No                   Acute             +----------+---------------+---------+-----------+----------+-----------------+ SFJ       Full                                                           +----------+---------------+---------+-----------+----------+-----------------+  FV Prox   None           No       No                   Acute             +----------+---------------+---------+-----------+----------+-----------------+ FV Mid    None           No       No                   Acute             +----------+---------------+---------+-----------+----------+-----------------+ FV Distal None           No       No                   Acute             +----------+---------------+---------+-----------+----------+-----------------+ PFV       None           No       No                   Acute             +----------+---------------+---------+-----------+----------+-----------------+ POP       Partial        No       No                   Age Indeterminate +----------+---------------+---------+-----------+----------+-----------------+ PTV       Full                                                            +----------+---------------+---------+-----------+----------+-----------------+ PERO      Full                                                           +----------+---------------+---------+-----------+----------+-----------------+ Gastroc   Full                                                           +----------+---------------+---------+-----------+----------+-----------------+ CIV Distal               No       No                   Acute             +----------+---------------+---------+-----------+----------+-----------------+ CIV mid                  No       No                   Acute             +----------+---------------+---------+-----------+----------+-----------------+ Proximal CIV and IVC appear patent by color and Doppler  +----------+---------------+---------+-----------+----------+--------------+ LEFT      CompressibilityPhasicitySpontaneityPropertiesThrombus Aging +----------+---------------+---------+-----------+----------+--------------+ CFV  Full           Yes                                          +----------+---------------+---------+-----------+----------+--------------+ CIV distal                                                            +----------+---------------+---------+-----------+----------+--------------+ CIV mid                                                               +----------+---------------+---------+-----------+----------+--------------+     Summary: RIGHT: - Findings consistent with acute deep vein thrombosis involving the right , Mid to distal common iliac vein, common femoral vein, right femoral vein, and right proximal profunda vein. The proximal common iliac and IVC appear patent by color and Doppler - Findings consistent with age indeterminate deep vein thrombosis involving the right popliteal vein.   *See table(s) above for measurements and  observations. Electronically signed by Joshua Robins Gerarda Fractionon 01/21/2022 at 11:15:49 AM.    Final    CT Angio Chest PE W and/or Wo Contrast  Result Date: 01/21/2022 CLINICAL DATA:  Right leg swelling, elevated D-dimer, evaluate for PE EXAM: CT ANGIOGRAPHY CHEST WITH CONTRAST TECHNIQUE: Multidetector CT imaging of the chest was performed using the standard protocol during bolus administration of intravenous contrast. Multiplanar CT image reconstructions and MIPs were obtained to evaluate the vascular anatomy. RADIATION DOSE REDUCTION: This exam was performed according to the departmental dose-optimization program which includes automated exposure control, adjustment of the mA and/or kV according to patient size and/or use of iterative reconstruction technique. CONTRAST:  75mL OMNIPAQUE IOHEXOL 350 MG/ML SOLN COMPARISON:  CTA chest dated 08/02/2021 FINDINGS: Cardiovascular: Satisfactory opacification of the bilateral pulmonary arteries to the segmental level. No evidence of pulmonary embolism. Although not tailored for evaluation of the thoracic aorta, there is no evidence thoracic aortic aneurysm or dissection. The heart is top-normal in size. Mediastinum/Nodes: No suspicious mediastinal lymphadenopathy. Visualized thyroid is unremarkable. Lungs/Pleura: Lungs are clear. No focal consolidation. No suspicious pulmonary nodules. No pleural effusion or pneumothorax. Upper Abdomen: Visualized upper abdomen is grossly unremarkable. Musculoskeletal: Visualized osseous structures are within normal limits. Review of the MIP images confirms the above findings. IMPRESSION: No evidence of pulmonary embolism. Negative CT chest. Electronically Signed   By: Charline BillsSriyesh  Krishnan M.D.   On: 01/21/2022 02:59    Microbiology: Results for orders placed or performed during the hospital encounter of 03/31/21  SARS CORONAVIRUS 2 (TAT 6-24 HRS) Nasopharyngeal Nasopharyngeal Swab     Status: None   Collection Time: 04/01/21  4:02 AM    Specimen: Nasopharyngeal Swab  Result Value Ref Range Status   SARS Coronavirus 2 NEGATIVE NEGATIVE Final    Comment: (NOTE) SARS-CoV-2 target nucleic acids are NOT DETECTED.  The SARS-CoV-2 RNA is generally detectable in upper and lower respiratory specimens during the acute phase of infection. Negative results do not preclude SARS-CoV-2 infection, do not rule out co-infections with other pathogens, and  should not be used as the sole basis for treatment or other patient management decisions. Negative results must be combined with clinical observations, patient history, and epidemiological information. The expected result is Negative.  Fact Sheet for Patients: HairSlick.no  Fact Sheet for Healthcare Providers: quierodirigir.com  This test is not yet approved or cleared by the [place] FDA and  has been authorized for detection and/or diagnosis of SARS-CoV-2 by FDA under an Emergency Use Authorization (EUA). This EUA will remain  in effect (meaning this test can be used) for the duration of the COVID-19 declaration under Se ction 564(b)(1) of the Act, 21 U.S.C. section 360bbb-3(b)(1), unless the authorization is terminated or revoked sooner.  Performed at [facility], [address], [place], [place] [address]   SARS CORONAVIRUS 2 (TAT 6-24 HRS) Nasopharyngeal Nasopharyngeal Swab     Status: None   Collection Time: 04/07/21  8:15 PM   Specimen: Nasopharyngeal Swab  Result Value Ref Range Status   SARS Coronavirus 2 NEGATIVE NEGATIVE Final    Comment: (NOTE) SARS-CoV-2 target nucleic acids are NOT DETECTED.  The SARS-CoV-2 RNA is generally detectable in upper and lower respiratory specimens during the acute phase of infection. Negative results do not preclude SARS-CoV-2 infection, do not rule out co-infections with other pathogens, and should not be used as the sole basis for treatment or other patient  management decisions. Negative results must be combined with clinical observations, patient history, and epidemiological information. The expected result is Negative.  Fact Sheet for Patients: HairSlick.no  Fact Sheet for Healthcare Providers: quierodirigir.com  This test is not yet approved or cleared by the [place] FDA and  has been authorized for detection and/or diagnosis of SARS-CoV-2 by FDA under an Emergency Use Authorization (EUA). This EUA will remain  in effect (meaning this test can be used) for the duration of the COVID-19 declaration under Se ction 564(b)(1) of the Act, 21 U.S.C. section 360bbb-3(b)(1), unless the authorization is terminated or revoked sooner.  Performed at [facility], [address] [place], [place] [address]     Labs: CBC: Recent Labs  Lab 01/17/22 0834 01/20/22 1903 01/22/22 0524  WBC 6.0 5.3 5.7  NEUTROABS 3.3 2.9  --   HGB 12.9 12.8 12.3  HCT 41.0 41.4 39.8  MCV 80.2 81.7 81.4  PLT 357 344 284   Basic Metabolic Panel: Recent Labs  Lab 01/20/22 1903  NA 137  K 4.1  CL 104  CO2 27  GLUCOSE 90  BUN 7  CREATININE 0.64  CALCIUM 9.4   Discharge time spent: 35 minutes.  Signed: Jacquelin Hawking, MD [facility] 01/22/2022

## 2022-01-22 NOTE — Progress Notes (Signed)
Pt with chronic occlusive right leg DVT. Unable to treat from an endovascular approach.  Would be best served with therapeutic INR. Thigh-high compression 20-30mmhg.   Will discuss with [place].  Ready for d/c from vascular surgery perspective tomorrow.  Will need bridge to therapeutic warfarin level.  Victorino Sparrow MD

## 2022-01-22 NOTE — Telephone Encounter (Signed)
Pharmacy Patient Advocate Encounter  Insurance verification completed.    The patient is insured through [employer] and [employer] [place] Medicaid    The patient is currently admitted and ran test claims for the following: enoxaparin (Lovenox) 100 mg/1 ml injection & enoxaparin (Lovenox) 80 mg/0.8 ml injection.  Copays and coinsurance results were relayed to Inpatient clinical team.

## 2022-01-22 NOTE — Progress Notes (Signed)
ANTICOAGULATION CONSULT NOTE   Pharmacy Consult for Lovenox/warfarin Indication: DVT  No Known Allergies  Patient Measurements: Height: 5\' 5"  (165.1 cm) Weight: 69.2 kg (152 lb 8.9 oz) IBW/kg (Calculated) : 57 Heparin Dosing Weight: 65 kg  Vital Signs: Temp: 98.6 F (37 C) (07/24 1556) Temp Source: Oral (07/24 1556) BP: 121/83 (07/24 1556) Pulse Rate: 81 (07/24 1556)  Labs: Recent Labs    01/20/22 1903 01/21/22 0218 01/21/22 1621 01/21/22 2329 01/22/22 0524 01/22/22 1309  HGB 12.8  --   --   --  12.3  --   HCT 41.4  --   --   --  39.8  --   PLT 344  --   --   --  284  --   APTT  --   --   --   --  81*  --   LABPROT 42.9* 41.9*  --   --  16.4*  --   INR 4.6* 4.4*  --   --  1.3*  --   HEPARINUNFRC  --   --    < > 0.32 0.51 0.54  CREATININE 0.64  --   --   --   --   --    < > = values in this interval not displayed.    Estimated Creatinine Clearance: 99.6 mL/min (by C-G formula based on SCr of 0.64 mg/dL).  Medical History: Past Medical History:  Diagnosis Date   Stroke [facility])     Assessment: 31 y.o. 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 She presented to the ED with recurrent extensive DVT in right lower extremity despite Coumadin therapy. INR elevated on admission and patient was given 10 mg vitamin K on 7/23 for reversal prior to vascular surgery. Pharmacy consulted for anticoagulant management.   7/23 Heparin initiated  7/24 INR down to 1.3, 10mg  dose warfarin given, consult to change heparin to lovenox for bridge in preparation for discharge.   PTA Warfarin: 7.5mg  WF, 10mg  AOD  Goal of Therapy:  INR 2-3 Therapeutic anticoagulation on Lovenox    Plan:  Discontinue heparin infusion at 2000 Initiate lovenox 70mg  Q12h at 2000 Continue warfarin per pharmacy Daily INR and CBC Q72h Monitor for signs/symptoms of  bleeding  8/24, PharmD PGY1 Pharmacy Resident 01/22/2022 5:47 PM

## 2022-01-22 NOTE — Progress Notes (Addendum)
ANTICOAGULATION CONSULT NOTE   Pharmacy Consult for heparin Indication: DVT  No Known Allergies  Patient Measurements: Height: 5\' 5"  (165.1 cm) Weight: 69.2 kg (152 lb 8.9 oz) IBW/kg (Calculated) : 57 Heparin Dosing Weight: 65 kg  Vital Signs: Temp: 98.3 F (36.8 C) (07/24 1238) Temp Source: Oral (07/24 1238) BP: 125/88 (07/24 1238) Pulse Rate: 82 (07/24 1238)  Labs: Recent Labs    01/20/22 1903 01/21/22 0218 01/21/22 1621 01/21/22 2329 01/22/22 0524 01/22/22 1309  HGB 12.8  --   --   --  12.3  --   HCT 41.4  --   --   --  39.8  --   PLT 344  --   --   --  284  --   APTT  --   --   --   --  81*  --   LABPROT 42.9* 41.9*  --   --  16.4*  --   INR 4.6* 4.4*  --   --  1.3*  --   HEPARINUNFRC  --   --    < > 0.32 0.51 0.54  CREATININE 0.64  --   --   --   --   --    < > = values in this interval not displayed.   Estimated Creatinine Clearance: 99.6 mL/min (by C-G formula based on SCr of 0.64 mg/dL).  Medical History: Past Medical History:  Diagnosis Date   Stroke [facility])     Assessment: 31 y.o. 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 She presents to the ED with complaint of right leg swelling. Found to have recurrent extensive DVT in right lower extremity despite Coumadin therapy. INR elevated on admission and patient was given 10 mg vitamin K on 7/23 for reversal. INR down to 1.3 today 7/24. Patient underwent peripheral vascular catheterization on 7/24 with no interventions. Pharmacy consulted for heparin drip. Heparin running at 1200 units/hour. Heparin level therapeutic at 0.54. Hgb stable and platelets 284. No bleeding noted.  Goal of Therapy:  Heparin level 0.3-0.7 units/ml Monitor platelets by anticoagulation protocol: Yes   Plan:  Continue heparin infusion at 1200 units/hr  Daily heparin level and CBC  Monitor for signs/symptoms  of bleeding  8/24, Pharm.D PGY1 Pharmacy Resident 01/22/2022 2:01 PM

## 2022-01-22 NOTE — Progress Notes (Signed)
Patient alert and oriented x 4 on room air.  Patient PIVs removed and patient assisted with dressing.  She will call the nursing desk when her transportation arrives to be discharged.   AVS reviewed with patient including follow-up appointments, when to call provider, and new medications, including pharmacy pickup location via teach-back method.  All patient questions answered.

## 2022-01-22 NOTE — Op Note (Signed)
DATE OF SERVICE: 01/22/2022  PATIENT:  Andrea Hayes  31 y.o. 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 Lenell Antu, MD  ASSISTANT: none  ANESTHESIA:   local and IV sedation  ESTIMATED BLOOD LOSS: minimal  LOCAL MEDICATIONS USED:  LIDOCAINE   COUNTS: confirmed correct.  PATIENT DISPOSITION:  PACU - hemodynamically stable.   Delay start of Pharmacological VTE agent (>24hrs) due to surgical blood loss or risk of bleeding: no  INDICATION FOR PROCEDURE: Andrea Hayes is a 31 y.o. female with right lower extremity swelling and pain.  Imaging in the emergency department suggested recurrence of acute iliofemoral deep venous thrombosis. After careful discussion of risks, benefits, and alternatives the patient was offered venogram with possible thrombectomy. The patient understood and wished to proceed.  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 Microwire fragmented during access.  This was imaged and found to be in the subcutaneous tissue and not a thromboembolic risk so it was left in place. Ultimately able to gain access and cross the chronic occlusion in the popliteal vein. 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  There is an enormous collateral draining the leg through the anterior abdominal wall.  I was not able to cross the lesion and so no intervention was performed.  DESCRIPTION OF PROCEDURE: After identification of the patient in the pre-operative holding area, the patient was transferred to the operating room. The patient was  positioned supine on the operating room table. Anesthesia was induced. The groins was prepped and draped in standard fashion. A surgical pause was performed confirming correct patient, procedure, and operative location.  The right popliteal space was anesthetized with subcutaneous injection of 1% lidocaine. Using ultrasound guidance, the right popliteal vein with micropuncture technique.  I was not able to easily thread a microwire.  An attempt to introduce the sheath, the wire fragmented.  Imaging taken of the access site showed the wire to be subcutaneous and not a thromboembolic threat.  I elected to leave the wire where it was.  I reattempted access with micropuncture technique.  Through this sheath I performed a venogram.  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  Using a series of wire exchanges I was able to cross this and establish a platform with an 8 [place] sheath.    I was able to navigate a Glidewire advantage into the common femoral vein.  Here catheter directed venography was performed.  A large collateral vein had developed across the anterior abdominal wall and was draining the leg.  This was larger than the common femoral vein.  The external iliac vein and common femoral vein were not opacifying with contrast.  I was able to select these veins and partially cross them.  I was not able to reenter the inferior vena cava centrally.  I elected to abandon our procedure here.  All endovascular equipment was removed.  A figure-of-eight stitch was placed around the right popliteal space at her access site with 4-0 Monocryl.  This rendered the venotomy hemostatic.  A sterile bandage was applied  Conscious sedation was administered with the use of IV fentanyl and midazolam  under continuous physician and nurse monitoring.  Heart rate, blood pressure, and oxygen saturation were continuously monitored.  Total sedation time was 37 minutes  Upon completion of the case instrument and  sharps counts were confirmed correct. The patient was transferred to the PACU in good condition. I was present for all portions of the procedure.  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  She needs anticoagulation indefinitely.  I do not think endovascular recanalization will be technically successful in the future.   Rande Brunt. Lenell Antu, MD [facility] Phone Number: [phone] 01/22/2022 12:14 PM

## 2022-01-22 NOTE — Progress Notes (Signed)
ANTICOAGULATION CONSULT NOTE - Follow Up Consult  Pharmacy Consult for heparin Indication: acute DVT while on warfarin for h/o recurrent VTE  Labs: Recent Labs    01/20/22 1903 01/21/22 0218 01/21/22 1621 01/21/22 2329  HGB 12.8  --   --   --   HCT 41.4  --   --   --   PLT 344  --   --   --   LABPROT 42.9* 41.9*  --   --   INR 4.6* 4.4*  --   --   HEPARINUNFRC  --   --  0.19* 0.32  CREATININE 0.64  --   --   --     Assessment: 30yo female therapeutic on heparin after rate change but at very low end of goal; no infusion issues or signs of bleeding per RN.  Goal of Therapy:  Heparin level 0.3-0.7 units/ml   Plan:  Will increase heparin infusion by 2 units/kg/hr to 1200 units/hr and check level in 6 hours.    Vernard Gambles, PharmD, BCPS  01/22/2022,12:23 AM

## 2022-01-22 NOTE — Progress Notes (Signed)
ANTICOAGULATION CONSULT NOTE   Pharmacy Consult for Lovenox/warfarin Indication: DVT  No Known Allergies  Patient Measurements: Height: 5\' 5"  (165.1 cm) Weight: 69.2 kg (152 lb 8.9 oz) IBW/kg (Calculated) : 57 Heparin Dosing Weight: 65 kg  Vital Signs: Temp: 98.6 F (37 C) (07/24 1556) Temp Source: Oral (07/24 1556) BP: 121/83 (07/24 1556) Pulse Rate: 81 (07/24 1556)  Labs: Recent Labs    01/20/22 1903 01/21/22 0218 01/21/22 1621 01/21/22 2329 01/22/22 0524 01/22/22 1309  HGB 12.8  --   --   --  12.3  --   HCT 41.4  --   --   --  39.8  --   PLT 344  --   --   --  284  --   APTT  --   --   --   --  81*  --   LABPROT 42.9* 41.9*  --   --  16.4*  --   INR 4.6* 4.4*  --   --  1.3*  --   HEPARINUNFRC  --   --    < > 0.32 0.51 0.54  CREATININE 0.64  --   --   --   --   --    < > = values in this interval not displayed.    Estimated Creatinine Clearance: 99.6 mL/min (by C-G formula based on SCr of 0.64 mg/dL).  Medical History: Past Medical History:  Diagnosis Date   Stroke [facility])     Assessment: 31 y.o. 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 She presented to the ED with recurrent extensive DVT in right lower extremity despite Coumadin therapy. INR elevated on admission and patient was given 10 mg vitamin K on 7/23 for reversal prior to vascular surgery. Pharmacy consulted for anticoagulant management.   7/23 Heparin initiated  7/24 INR down to 1.3, 10mg  dose warfarin given, consult to change heparin to lovenox for bridge in preparation for discharge.   PTA Warfarin: 7.5mg  WF, 10mg  AOD  Goal of Therapy:  INR 2-3 Therapeutic anticoagulation on Lovenox    Plan:  Discontinue heparin infusion at 2000 Initiate lovenox 70mg  Q12h at 2000 Continue warfarin per pharmacy Daily INR and CBC Q72h Monitor for signs/symptoms of  bleeding  8/24, PharmD PGY1 Pharmacy Resident 01/22/2022 6:19 PM   Addendum:  Patient now being discharged tonight - lovenox 100mg  Q24h given to allow for improved adherence.

## 2022-01-22 NOTE — Progress Notes (Signed)
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  She was last seen by nurse petitioner Clayborn Heron on December 20, 2021, and she has been on Coumadin 10 mg daily with therapeutic INR most time.  She has been taking Coumadin at 4 PM daily, which was instructed by one of her doctors, and she missed 3 doses early last week in a row, then noticed right leg edema.  She restarted Coumadin at 15mg  daily last Thursday for 2 days before she presented to ED for worsening right lower extremity edema, Doppler showed acute DVT.  She has been on heparin since admission.  She was seen by vascular surgeon, underwent angioplasty this afternoon by Dr. Tuesday, but unfortunately she has a chronic occlusive right leg DVT, unable to treat from an endovascular approach.  She is eager to go home.   Objective:  Vitals:   01/22/22 1238 01/22/22 1556  BP: 125/88 121/83  Pulse: 82 81  Resp: 16 18  Temp: 98.3 F (36.8 C) 98.6 F (37 C)  SpO2: 99% 97%    Body mass index is 25.39 kg/m.  Intake/Output Summary (Last 24 hours) at 01/22/2022 1727 Last data filed at 01/22/2022 1537 Gross per 24 hour  Intake 947.58 ml  Output 0 ml  Net 947.58 ml     Sclerae unicteric  Oropharynx clear  No peripheral adenopathy  Lungs clear -- no rales or rhonchi  Heart regular rate and rhythm  Abdomen benign  MSK no focal spinal tenderness, (+) right lower extremity edema  Neuro nonfocal    CBG (last 3)  No results for input(s): "GLUCAP" in the last 72 hours.   Labs:   Urine Studies No results for input(s): "UHGB", "CRYS" in the last 72 hours.  Invalid input(s): "UACOL", "UAPR", "USPG", "UPH", "UTP", "UGL", "UKET", "UBIL", "UNIT", "UROB", "ULEU", "UEPI", "UWBC", "URBC", "UBAC", "CAST", "UCOM", "BILUA"  Basic Metabolic Panel: Recent Labs  Lab 01/20/22 1903  NA 137  K 4.1  CL 104  CO2 27  GLUCOSE 90  BUN  7  CREATININE 0.64  CALCIUM 9.4   GFR Estimated Creatinine Clearance: 99.6 mL/min (by C-G formula based on SCr of 0.64 mg/dL). Liver Function Tests: No results for input(s): "AST", "ALT", "ALKPHOS", "BILITOT", "PROT", "ALBUMIN" in the last 168 hours. No results for input(s): "LIPASE", "AMYLASE" in the last 168 hours. No results for input(s): "AMMONIA" in the last 168 hours. Coagulation profile Recent Labs  Lab 01/17/22 0834 01/20/22 1903 01/21/22 0218 01/22/22 0524  INR 2.2* 4.6* 4.4* 1.3*    CBC: Recent Labs  Lab 01/17/22 0834 01/20/22 1903 01/22/22 0524  WBC 6.0 5.3 5.7  NEUTROABS 3.3 2.9  --   HGB 12.9 12.8 12.3  HCT 41.0 41.4 39.8  MCV 80.2 81.7 81.4  PLT 357 344 284   Cardiac Enzymes: No results for input(s): "CKTOTAL", "CKMB", "CKMBINDEX", "TROPONINI" in the last 168 hours. BNP: Invalid input(s): "POCBNP" CBG: No results for input(s): "GLUCAP" in the last 168 hours. D-Dimer No results for input(s): "DDIMER" in the last 72 hours. Hgb A1c No results for input(s): "HGBA1C" in the last 72 hours. Lipid Profile No results for input(s): "CHOL", "HDL", "LDLCALC", "TRIG", "CHOLHDL", "LDLDIRECT" in the last 72 hours. Thyroid function studies No results for input(s): "TSH", "T4TOTAL", "T3FREE", "THYROIDAB" in the last 72 hours.  Invalid input(s): "FREET3" Anemia work up No results for input(s): "VITAMINB12", "FOLATE", "FERRITIN", "  TIBC", "IRON", "RETICCTPCT" in the last 72 hours. Microbiology No results found for this or any previous visit (from the past 240 hour(s)).    Studies:  PERIPHERAL VASCULAR CATHETERIZATION  Result Date: 01/22/2022 DATE OF SERVICE: 01/22/2022  PATIENT:  Andrea Hayes  31 y.o. 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 Lenell Antu, MD  ASSISTANT: none  ANESTHESIA:   local and IV sedation  ESTIMATED BLOOD LOSS: minimal  LOCAL MEDICATIONS USED:  LIDOCAINE  COUNTS: confirmed correct.  PATIENT DISPOSITION:  PACU - hemodynamically stable.  Delay start of Pharmacological VTE agent (>24hrs) due to surgical blood loss or risk of bleeding: no  INDICATION FOR PROCEDURE: Andrea Hayes is a 31 y.o. female with right lower extremity swelling and pain.  Imaging in the emergency department suggested recurrence of acute iliofemoral deep venous thrombosis. After careful discussion of risks, benefits, and alternatives the patient was offered venogram with possible thrombectomy. The patient understood and wished to proceed.  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 Microwire fragmented during access.  This was imaged and found to be in the subcutaneous tissue and not a thromboembolic risk so it was left in place. Ultimately able to gain access and cross the chronic occlusion in the popliteal vein. 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  There is an enormous collateral draining the leg through the anterior abdominal wall. I was not able to cross the lesion and so no intervention was performed.  DESCRIPTION OF PROCEDURE: After identification of the patient in the pre-operative holding area, the patient was transferred to the operating room. The patient was positioned supine on the operating room table. Anesthesia was induced. The groins was prepped and draped in standard fashion. A surgical pause was performed confirming correct patient, procedure, and operative location.  The right popliteal space was anesthetized with subcutaneous injection of 1% lidocaine. Using ultrasound guidance, the right popliteal vein with  micropuncture technique.  I was not able to easily thread a microwire.  An attempt to introduce the sheath, the wire fragmented.  Imaging taken of the access site showed the wire to be subcutaneous and not a thromboembolic threat.  I elected to leave the wire where it was.  I reattempted access with micropuncture technique.  Through this sheath I performed a venogram.  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  Using a series of wire exchanges I was able to cross this and establish a platform with an 8 [place] sheath.   I was able to navigate a Glidewire advantage into the common femoral vein.  Here catheter directed venography was performed.  A large collateral vein had developed across the anterior abdominal wall and was draining the leg.  This was larger than the common femoral vein.  The external iliac vein and common femoral vein were not opacifying with contrast.  I was able to select these veins and partially cross them.  I was not able to reenter the inferior vena cava centrally.  I elected to abandon our procedure here. All endovascular equipment was removed.  A figure-of-eight stitch was placed around the right popliteal space at her access  site with 4-0 Monocryl.  This rendered the venotomy hemostatic.  A sterile bandage was applied  Conscious sedation was administered with the use of IV fentanyl and midazolam under continuous physician and nurse monitoring.  Heart rate, blood pressure, and oxygen saturation were continuously monitored.  Total sedation time was 37 minutes  Upon completion of the case instrument and sharps counts were confirmed correct. The patient was transferred to the PACU in good condition. I was present for all portions of the procedure.  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  She needs anticoagulation indefinitely.  I do not think endovascular recanalization will be technically successful in the future.  Rande Brunthomas N. Lenell AntuHawken, MD  [facility] Office Phone Number: [phone] 01/22/2022 12:14 PM   CT VENOGRAM ABD/PEL  Result Date: 01/21/2022 CLINICAL DATA:  Deep venous thrombosis, evaluate iliac veins and IVC EXAM: CT VENOGRAM ABDOMEN AND PELVIS TECHNIQUE: RADIATION DOSE REDUCTION: This exam was performed according to the departmental dose-optimization program which includes automated exposure control, adjustment of the mA and/or kV according to patient size and/or use of iterative reconstruction technique. CONTRAST:  125mL OMNIPAQUE IOHEXOL 350 MG/ML SOLN COMPARISON:  CT done on 03/31/2021 FINDINGS: There are intraluminal filling defects in right common femoral vein and right external iliac vein. Rest of the major venous structures appear patent. There are no filling defects seen inferior vena cava. Aorta is of normal caliber. Major branches of the aorta appear patent. Visualized lower lung fields are clear. There is no pleural effusion. No focal abnormalities are seen in liver. There is no dilation of bile ducts. Gallbladder is unremarkable. No focal abnormalities are seen in the pancreas. Spleen is unremarkable. Adrenals are unremarkable. There is no hydronephrosis. Urinary bladder is unremarkable. Stomach is unremarkable. Small bowel loops are not dilated. Appendix is not distinctly seen. There is no significant wall thickening in colon. There is 4.5 cm low density structure in the left adnexa. There is slightly increased attenuation in the posterior aspect of this lesion suggesting possible hemorrhagic functional left ovarian cyst. There is slightly inhomogeneous enhancement in myometrium. There is no pneumoperitoneum. There are tortuous ectatic veins in subcutaneous plane in the right anterior abdominal wall extending to the right common femoral vein. There is possible occlusive DVT in the right common femoral vein. Visualized bony structures are unremarkable. IMPRESSION: Intraluminal filling defects  are seen in right common femoral vein and right external iliac vein. There is a prominent tortuous venous structure in subcutaneous plane in the right anterior abdominal wall extending to the right common femoral vein. Findings suggest possible occlusive DVT in the right common femoral vein. Rest of the major deep veins in the abdomen and pelvis including the inferior vena cava appear to be patent without intraluminal defects. There is no evidence of intestinal obstruction or pneumoperitoneum. There is no hydronephrosis. There is 4.5 cm smoothly marginated lesion in the left adnexa, possibly hemorrhagic functional left ovarian cyst. If clinically warranted, follow-up pelvic sonogram may be considered. Electronically Signed   By: Ernie AvenaPalani  Rathinasamy M.D.   On: 01/21/2022 12:47   VAS US LOWER EXTREMITY VENOUS (DVT) (7a-7p)  Result Date: 01/21/2022  Lower Venous DVT Study Patient Name:  Andrea Hayes  Date of Exam:   01/20/2022 Medical Rec #: [id number]       Accession #:    [id number] Date of Birth: 12/29/1990       Patient Gender: F Patient Age:   [age] years Exam Location:  [facility] Procedure:      VAS [place] LOWER EXTREMITY VENOUS (DVT) Referring Phys: Vernona Rieger MURPHY --------------------------------------------------------------------------------  Indications: Swelling.  Risk Factors: Surgery bilateral vena cava, iliac, femoral endovascular thrombectomy and right common iliac vein angioplasty 03/24/21. Anticoagulation: Coumadin (patient admitted she is not fully compliant on medication). Comparison Study: Prior left lower extremity 04/01/21 demonstrated acute DVT in                   the femoral and PTV veins as well as age indeterminate DVT in                   the right common femoral vein. Performing Technologist: Sherren Kerns RVS  Examination Guidelines: A complete evaluation includes B-mode imaging, spectral Doppler, color Doppler, and power Doppler as needed of all accessible portions of each vessel.  Bilateral testing is considered an integral part of a complete examination. Limited examinations for reoccurring indications may be performed as noted. The reflux portion of the exam is performed with the patient in reverse Trendelenburg.  +----------+---------------+---------+-----------+----------+-----------------+ RIGHT     CompressibilityPhasicitySpontaneityPropertiesThrombus Aging    +----------+---------------+---------+-----------+----------+-----------------+ CFV       None           No       No                   Acute             +----------+---------------+---------+-----------+----------+-----------------+ SFJ       Full                                                           +----------+---------------+---------+-----------+----------+-----------------+ FV Prox   None           No       No                   Acute             +----------+---------------+---------+-----------+----------+-----------------+ FV Mid    None           No       No                   Acute             +----------+---------------+---------+-----------+----------+-----------------+ FV Distal None           No       No                   Acute             +----------+---------------+---------+-----------+----------+-----------------+ PFV       None           No       No                   Acute             +----------+---------------+---------+-----------+----------+-----------------+ POP       Partial        No       No                   Age Indeterminate +----------+---------------+---------+-----------+----------+-----------------+ PTV       Full                                                           +----------+---------------+---------+-----------+----------+-----------------+  PERO      Full                                                           +----------+---------------+---------+-----------+----------+-----------------+ Gastroc   Full                                                            +----------+---------------+---------+-----------+----------+-----------------+ CIV Distal               No       No                   Acute             +----------+---------------+---------+-----------+----------+-----------------+ CIV mid                  No       No                   Acute             +----------+---------------+---------+-----------+----------+-----------------+ Proximal CIV and IVC appear patent by color and Doppler  +----------+---------------+---------+-----------+----------+--------------+ LEFT      CompressibilityPhasicitySpontaneityPropertiesThrombus Aging +----------+---------------+---------+-----------+----------+--------------+ CFV       Full           Yes                                          +----------+---------------+---------+-----------+----------+--------------+ CIV distal                                                            +----------+---------------+---------+-----------+----------+--------------+ CIV mid                                                               +----------+---------------+---------+-----------+----------+--------------+     Summary: RIGHT: - Findings consistent with acute deep vein thrombosis involving the right , Mid to distal common iliac vein, common femoral vein, right femoral vein, and right proximal profunda vein. The proximal common iliac and IVC appear patent by color and Doppler - Findings consistent with age indeterminate deep vein thrombosis involving the right popliteal vein.   *See table(s) above for measurements and observations. Electronically signed by Gerarda Fraction on 01/21/2022 at 11:15:49 AM.    Final    CT Angio Chest PE W and/or Wo Contrast  Result Date: 01/21/2022 CLINICAL DATA:  Right leg swelling, elevated D-dimer, evaluate for PE EXAM: CT ANGIOGRAPHY CHEST WITH CONTRAST TECHNIQUE: Multidetector CT imaging of the chest was  performed using the standard protocol during bolus administration of intravenous contrast. Multiplanar CT image reconstructions and MIPs were obtained to evaluate  the vascular anatomy. RADIATION DOSE REDUCTION: This exam was performed according to the departmental dose-optimization program which includes automated exposure control, adjustment of the mA and/or kV according to patient size and/or use of iterative reconstruction technique. CONTRAST:  75mL OMNIPAQUE IOHEXOL 350 MG/ML SOLN COMPARISON:  CTA chest dated 08/02/2021 FINDINGS: Cardiovascular: Satisfactory opacification of the bilateral pulmonary arteries to the segmental level. No evidence of pulmonary embolism. Although not tailored for evaluation of the thoracic aorta, there is no evidence thoracic aortic aneurysm or dissection. The heart is top-normal in size. Mediastinum/Nodes: No suspicious mediastinal lymphadenopathy. Visualized thyroid is unremarkable. Lungs/Pleura: Lungs are clear. No focal consolidation. No suspicious pulmonary nodules. No pleural effusion or pneumothorax. Upper Abdomen: Visualized upper abdomen is grossly unremarkable. Musculoskeletal: Visualized osseous structures are within normal limits. Review of the MIP images confirms the above findings. IMPRESSION: No evidence of pulmonary embolism. Negative CT chest. Electronically Signed   By: Charline Bills M.D.   On: 01/21/2022 02:59    Assessment: 31 y.o. female with recurrent DVT/PE, on Coumadin  Acute right lower extremity DVT, secondary to missing Coumadin dose last week Hypertension   Plan:  -Her right lower extremity DVT is likely related to 3 missing doses of Coumadin during last week, she restarted with higher dose, and INR was supratherapeutic on admission.  I do not think this is a Coumadin failure, her noncompliance is probably related to the awkward timing of taking Coumadin.  I told her to take Coumadin at bedtime, she agrees and states it would be easier for her  to take at bedtime.  We also discussed if she misses a dose, and she will restart as soon as she realized, and gradually go back to her normal time to take  -Restart Coumadin 10 mg daily today -She needs Lovenox bridging, please prescribe 5 doses, and give her 1 dose before discharge today -I will check her INR in my office this Friday. -spoke with Dr. Beatriz Chancellor, MD 01/22/2022  5:27 PM

## 2022-01-23 ENCOUNTER — Encounter (HOSPITAL_COMMUNITY): Payer: Self-pay | Admitting: Vascular Surgery

## 2022-01-24 ENCOUNTER — Telehealth: Payer: Self-pay | Admitting: Hematology

## 2022-01-24 NOTE — Telephone Encounter (Signed)
.  Called patient to schedule appointment per 07/25 inbasket, patient is aware of date and time.   

## 2022-01-26 ENCOUNTER — Inpatient Hospital Stay (HOSPITAL_BASED_OUTPATIENT_CLINIC_OR_DEPARTMENT_OTHER): Payer: Medicaid Other | Admitting: Hematology

## 2022-01-26 ENCOUNTER — Encounter: Payer: Self-pay | Admitting: Hematology

## 2022-01-26 ENCOUNTER — Other Ambulatory Visit: Payer: Self-pay

## 2022-01-26 ENCOUNTER — Telehealth: Payer: Self-pay

## 2022-01-26 ENCOUNTER — Inpatient Hospital Stay: Payer: Medicaid Other

## 2022-01-26 VITALS — BP 110/72 | HR 89 | Temp 98.2°F | Resp 16 | Wt 148.1 lb

## 2022-01-26 DIAGNOSIS — I82501 Chronic embolism and thrombosis of unspecified deep veins of right lower extremity: Secondary | ICD-10-CM | POA: Diagnosis not present

## 2022-01-26 DIAGNOSIS — I82411 Acute embolism and thrombosis of right femoral vein: Secondary | ICD-10-CM

## 2022-01-26 DIAGNOSIS — I82509 Chronic embolism and thrombosis of unspecified deep veins of unspecified lower extremity: Secondary | ICD-10-CM | POA: Diagnosis present

## 2022-01-26 DIAGNOSIS — Z7901 Long term (current) use of anticoagulants: Secondary | ICD-10-CM | POA: Diagnosis not present

## 2022-01-26 DIAGNOSIS — Z86718 Personal history of other venous thrombosis and embolism: Secondary | ICD-10-CM | POA: Diagnosis not present

## 2022-01-26 DIAGNOSIS — I82220 Acute embolism and thrombosis of inferior vena cava: Secondary | ICD-10-CM

## 2022-01-26 DIAGNOSIS — Z79899 Other long term (current) drug therapy: Secondary | ICD-10-CM | POA: Diagnosis not present

## 2022-01-26 DIAGNOSIS — Z8673 Personal history of transient ischemic attack (TIA), and cerebral infarction without residual deficits: Secondary | ICD-10-CM | POA: Diagnosis not present

## 2022-01-26 LAB — PROTIME-INR
INR: 1.5 — ABNORMAL HIGH (ref 0.8–1.2)
Prothrombin Time: 17.6 seconds — ABNORMAL HIGH (ref 11.4–15.2)

## 2022-01-26 LAB — CBC WITH DIFFERENTIAL (CANCER CENTER ONLY)
Abs Immature Granulocytes: 0.02 10*3/uL (ref 0.00–0.07)
Basophils Absolute: 0 10*3/uL (ref 0.0–0.1)
Basophils Relative: 0 %
Eosinophils Absolute: 0.2 10*3/uL (ref 0.0–0.5)
Eosinophils Relative: 2 %
HCT: 40.4 % (ref 36.0–46.0)
Hemoglobin: 12.5 g/dL (ref 12.0–15.0)
Immature Granulocytes: 0 %
Lymphocytes Relative: 26 %
Lymphs Abs: 1.7 10*3/uL (ref 0.7–4.0)
MCH: 25.2 pg — ABNORMAL LOW (ref 26.0–34.0)
MCHC: 30.9 g/dL (ref 30.0–36.0)
MCV: 81.5 fL (ref 80.0–100.0)
Monocytes Absolute: 0.9 10*3/uL (ref 0.1–1.0)
Monocytes Relative: 14 %
Neutro Abs: 3.8 10*3/uL (ref 1.7–7.7)
Neutrophils Relative %: 58 %
Platelet Count: 304 10*3/uL (ref 150–400)
RBC: 4.96 MIL/uL (ref 3.87–5.11)
RDW: 17.1 % — ABNORMAL HIGH (ref 11.5–15.5)
WBC Count: 6.5 10*3/uL (ref 4.0–10.5)
nRBC: 0 % (ref 0.0–0.2)

## 2022-01-26 MED ORDER — ENOXAPARIN SODIUM 100 MG/ML IJ SOSY: 100.0000 mg | PREFILLED_SYRINGE | Freq: Every day | INTRAMUSCULAR | 0 refills | Status: DC

## 2022-01-26 NOTE — Telephone Encounter (Signed)
This nurse reached out to patient and made her aware that INR is 1.5 today.  Provider recommendations is to continue taking 10 mg of Warfarin daily, also take Lovenox daily until office visit on Thursday 8/3 and lab will be redrawn before office visit, scheduling will reach out to set the time.  This nurse also made patient aware that a lab appointment has been scheduled for Monday 7/31 at 845 am.  Patient acknowledged understanding.  No further questions or concerns at this time.

## 2022-01-26 NOTE — Progress Notes (Signed)
[facility]   Telephone:(336) [phone] Fax:(336) [phone]   Clinic Follow up Note   Patient Care Team: Fleet Contras, MD as PCP - General (Internal Medicine) Drema Dallas, DO as Consulting Physician (Neurology) Leonie Douglas, MD as Consulting Physician (Vascular Surgery) Leane Call, RN (Inactive) as Case Manager  Date of Service:  01/26/2022  CHIEF COMPLAINT: f/u of recurrent DVT/PE  CURRENT THERAPY:  Coumadin 10 mg po once daily  ASSESSMENT & PLAN:  Andrea Hayes is a 31 y.o. female with   1.  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  -she developed acute lower extremity DVT on Xarelto in 03/2021. Xarelto was stopped, and she was treated with heparin in the hospital and transitioned to Coumadin -most recently developed acute RLE DVT due to missing 3 coumadin doses in a row. She was discharged with Lovenox bridging on 01/22/22. -she notes she has difficulty remembering to give herself the lovenox and she missed one dose yesterday, she will restart as soon as she gets home today     PLAN: -INR 1.5mg  today, continue Lovenox 100 mg daily, I refilled her 5 more additional dose -Continue Coumadin at the same dose 10 mg daily, repeat PT/INR on Monday, July 31 -Lab and follow-up on 8/3    No problem-specific Assessment & Plan notes found for this encounter.   INTERVAL HISTORY:  Andrea  Hayes is here for a follow up of recurrent DVT. She was last seen by me on 01/22/22 while she was in the hospital. She presents to the clinic alone. She reports her swelling and pain is improved.   All other systems were reviewed with the patient and are negative.  MEDICAL HISTORY:  Past Medical History:  Diagnosis Date   Stroke [facility])     SURGICAL HISTORY: Past Surgical History:  Procedure Laterality Date   BIOPSY  04/05/2021   Procedure: BIOPSY;  Surgeon: Jenel Lucks, MD;  Location: [facility] ENDOSCOPY;  Service: Gastroenterology;;   COLONOSCOPY N/A 04/05/2021   Procedure: COLONOSCOPY;  Surgeon: Jenel Lucks, MD;  Location: [facility] ENDOSCOPY;  Service: Gastroenterology;  Laterality: N/A;   ESOPHAGOGASTRODUODENOSCOPY N/A 04/05/2021   Procedure: ESOPHAGOGASTRODUODENOSCOPY (EGD);  Surgeon: Jenel Lucks, MD;  Location: [facility] ENDOSCOPY;  Service: Gastroenterology;  Laterality: N/A;   LOWER EXTREMITY VENOGRAPHY Right 01/22/2022   Procedure: LOWER EXTREMITY VENOGRAPHY;  Surgeon: Leonie Douglas, MD;  Location: [facility] INVASIVE CV LAB;  Service: Cardiovascular;  Laterality: Right;   NO PAST SURGERIES     PERIPHERAL VASCULAR BALLOON ANGIOPLASTY  03/24/2021   Procedure: PERIPHERAL VASCULAR BALLOON ANGIOPLASTY;  Surgeon: Leonie Douglas, MD;  Location: [facility] INVASIVE CV LAB;  Service: Cardiovascular;;  bilateral common iliacs   PERIPHERAL VASCULAR THROMBECTOMY N/A 03/23/2021   Procedure: PERIPHERAL VASCULAR THROMBECTOMY;  Surgeon: Maeola Harman, MD;  Location: [facility] INVASIVE CV LAB;  Service: Cardiovascular;  Laterality: N/A;   PERIPHERAL VASCULAR THROMBECTOMY N/A 03/24/2021   Procedure: LYSIS RECHECK;  Surgeon: Leonie Douglas, MD;  Location: [facility] INVASIVE CV LAB;  Service: Cardiovascular;  Laterality: N/A;    I have reviewed the social history and family history with the patient and they are unchanged from previous note.  ALLERGIES:  has No Known Allergies.  MEDICATIONS:  Current  Outpatient Medications  Medication Sig Dispense Refill   acetaminophen (TYLENOL) 500 MG tablet Take 1,000 mg by mouth every 6 (six) hours as needed for mild pain.     acetaZOLAMIDE (DIAMOX) 125 MG tablet Take 2 tablets (250 mg total) by mouth 2 (two) times daily. 120 tablet 5   cetirizine (ZYRTEC) 10 MG tablet Take 10 mg by mouth daily as needed for allergies.     diclofenac Sodium (VOLTAREN) 1 % GEL Apply 4 g topically 4 (four) times daily as needed for pain.     docusate sodium (COLACE) 100 MG capsule Take 1 capsule (100 mg total) by mouth at bedtime. (Patient taking differently: Take 100 mg by mouth daily as needed for mild constipation.) 10 capsule 0   enoxaparin (LOVENOX) 100 MG/ML injection Inject 1 mL (100 mg total) into the skin at bedtime for 5 days. 5 mL 0   metoprolol succinate (TOPROL-XL) 25 MG 24 hr tablet Take 1 tablet (25 mg total) by mouth daily. (Patient not taking: Reported on 01/21/2022) 30 tablet 1   polyethylene glycol (MIRALAX / GLYCOLAX) 17 g packet Take 17 g by mouth daily. (Patient taking differently: Take 17 g by mouth daily as needed for mild constipation.) 14 each 0   Prenatal Vit-Fe Fumarate-FA (PRENATAL VITAMIN) 27-0.8 MG TABS Take 1 tablet by mouth daily. 30 tablet 12   tiZANidine (ZANAFLEX) 2 MG tablet Take 2 mg by mouth 2 (two) times daily as needed for muscle spasms.     Vitamin D, Ergocalciferol, (DRISDOL) 1.25 MG (50000 UNIT) CAPS capsule Take 50,000 Units by mouth every 7 (seven) days. Saturday     warfarin (COUMADIN) 10 MG tablet Take 1 tablet (10 mg total) by mouth at bedtime. 30 tablet 2   No current facility-administered medications for this visit.    PHYSICAL EXAMINATION: ECOG PERFORMANCE STATUS: 1 - Symptomatic but completely ambulatory  Vitals:   01/26/22 0922  BP: 110/72  Pulse: 89  Resp: 16  Temp: 98.2 F (36.8 C)  SpO2: 96%   Wt Readings from Last 3 Encounters:  01/26/22 148 lb 1 oz (67.2 kg)  01/21/22 152 lb 8.9 oz (69.2 kg)  01/09/22  143 lb (64.9 kg)     GENERAL:alert, no distress and comfortable SKIN: skin color normal, no rashes or significant lesions EYES: normal, Conjunctiva are pink and non-injected, sclera clear  NEURO: alert & oriented x 3 with fluent speech  LABORATORY DATA:  I have reviewed the data as listed    Latest Ref Rng & Units 01/26/2022    8:59 AM 01/22/2022    5:24 AM 01/20/2022    7:03 PM  CBC  WBC 4.0 - 10.5 K/uL 6.5  5.7  5.3   Hemoglobin 12.0 - 15.0 g/dL 12.5  12.3  12.8   Hematocrit 36.0 - 46.0 % 40.4  39.8  41.4   Platelets 150 - 400 K/uL 304  284  344         Latest Ref Rng & Units 01/20/2022    7:03 PM 12/20/2021    9:02 AM 11/01/2021    2:12 AM  CMP  Glucose 70 - 99 mg/dL 90  97  88   BUN 6 - 20 mg/dL 7  8  6    Creatinine 0.44 -  1.00 mg/dL 4.31  5.40  0.86   Sodium 135 - 145 mmol/L 137  136  137   Potassium 3.5 - 5.1 mmol/L 4.1  3.5  4.0   Chloride 98 - 111 mmol/L 104  104  102   CO2 22 - 32 mmol/L 27  26  27    Calcium 8.9 - 10.3 mg/dL 9.4  9.3  9.7   Total Protein 6.5 - 8.1 g/dL  7.8    Total Bilirubin 0.3 - 1.2 mg/dL  0.3    Alkaline Phos 38 - 126 U/L  107    AST 15 - 41 U/L  21    ALT 0 - 44 U/L  37        RADIOGRAPHIC STUDIES: I have personally reviewed the radiological images as listed and agreed with the findings in the report. No results found.    No orders of the defined types were placed in this encounter.  All questions were answered. The patient knows to call the clinic with any problems, questions or concerns. No barriers to learning was detected. The total time spent in the appointment was 20 minutes.     , MD 01/26/2022   I, 01/28/2022, am acting as scribe for Mickie Bail, MD.   I have reviewed the above documentation for accuracy and completeness, and I agree with the above.

## 2022-01-29 ENCOUNTER — Inpatient Hospital Stay: Payer: Medicaid Other

## 2022-01-29 ENCOUNTER — Other Ambulatory Visit: Payer: Self-pay

## 2022-01-29 ENCOUNTER — Telehealth: Payer: Self-pay | Admitting: Hematology

## 2022-01-29 DIAGNOSIS — I82501 Chronic embolism and thrombosis of unspecified deep veins of right lower extremity: Secondary | ICD-10-CM | POA: Diagnosis not present

## 2022-01-29 DIAGNOSIS — I82509 Chronic embolism and thrombosis of unspecified deep veins of unspecified lower extremity: Secondary | ICD-10-CM | POA: Diagnosis not present

## 2022-01-29 DIAGNOSIS — I82220 Acute embolism and thrombosis of inferior vena cava: Secondary | ICD-10-CM

## 2022-01-29 LAB — PROTIME-INR
INR: 2.2 — ABNORMAL HIGH (ref 0.8–1.2)
Prothrombin Time: 24.2 seconds — ABNORMAL HIGH (ref 11.4–15.2)

## 2022-01-29 NOTE — Telephone Encounter (Signed)
Scheduled per 7/28 in basket, pt confirmed appt

## 2022-01-31 ENCOUNTER — Inpatient Hospital Stay: Payer: Medicaid Other

## 2022-01-31 NOTE — Telephone Encounter (Signed)
Opened chart in error.

## 2022-02-01 ENCOUNTER — Encounter: Payer: Self-pay | Admitting: Hematology

## 2022-02-01 ENCOUNTER — Other Ambulatory Visit: Payer: Self-pay

## 2022-02-01 ENCOUNTER — Telehealth: Payer: Self-pay

## 2022-02-01 ENCOUNTER — Inpatient Hospital Stay (HOSPITAL_BASED_OUTPATIENT_CLINIC_OR_DEPARTMENT_OTHER): Payer: Medicaid Other | Admitting: Hematology

## 2022-02-01 ENCOUNTER — Inpatient Hospital Stay: Payer: Medicaid Other | Attending: Nurse Practitioner

## 2022-02-01 VITALS — BP 113/68 | HR 87 | Temp 97.9°F | Resp 18 | Wt 148.1 lb

## 2022-02-01 DIAGNOSIS — I82509 Chronic embolism and thrombosis of unspecified deep veins of unspecified lower extremity: Secondary | ICD-10-CM | POA: Diagnosis present

## 2022-02-01 DIAGNOSIS — I82411 Acute embolism and thrombosis of right femoral vein: Secondary | ICD-10-CM

## 2022-02-01 DIAGNOSIS — Z7901 Long term (current) use of anticoagulants: Secondary | ICD-10-CM | POA: Diagnosis not present

## 2022-02-01 DIAGNOSIS — R319 Hematuria, unspecified: Secondary | ICD-10-CM | POA: Insufficient documentation

## 2022-02-01 DIAGNOSIS — I82220 Acute embolism and thrombosis of inferior vena cava: Secondary | ICD-10-CM

## 2022-02-01 LAB — PROTIME-INR
INR: 4.3 (ref 0.8–1.2)
Prothrombin Time: 40.6 seconds — ABNORMAL HIGH (ref 11.4–15.2)

## 2022-02-01 NOTE — Progress Notes (Signed)
[facility]   Telephone:(336) [phone] Fax:(336) [phone]   Clinic Follow up Note   Patient Care Team: Fleet Contras, MD as PCP - General (Internal Medicine) Drema Dallas, DO as Consulting Physician (Neurology) Leonie Douglas, MD as Consulting Physician (Vascular Surgery) Leane Call, RN (Inactive) as Case Manager Malachy Mood, MD as Consulting Physician (Hematology)  Date of Service:  02/01/2022  CHIEF COMPLAINT: f/u of recurrent DVT/PE  CURRENT THERAPY:  Coumadin 10 mg po once daily  ASSESSMENT & PLAN:  Andrea Hayes is a 31 y.o. female with   1.  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  -she developed acute lower extremity DVT on Xarelto in 03/2021. Xarelto was stopped, and she was treated with heparin in the hospital and transitioned to Coumadin -most recently developed acute RLE DVT due to missing 3 coumadin doses in a row. She was discharged with Lovenox bridging on 01/22/22. -she is now having hematuria; I advised her to discontinue the lovenox injections but continue coumadin. INR 4.3 today, will skip one dose tonight  -We will monitor her INR weekly for the next month. -goal of INR 2-3     PLAN: -stop lovenox  -Continue Coumadin at the same dose 10 mg daily, and skip dose tonight  -Lab in 1 and 2 weeks -f/u in 4 weeks   No problem-specific Assessment & Plan notes found for  this encounter.   INTERVAL HISTORY:  Andrea Hayes is here for a follow up of recurrent DVT/PE. She was last seen by me on 01/26/22. She presents to the clinic alone. She reports continued swelling and redness to her legs. She reports blood in her urine since Monday, 7/31. She denies blood clots.   All other systems were reviewed with the patient and are negative.  MEDICAL HISTORY:  Past Medical History:  Diagnosis Date   Stroke [facility])     SURGICAL HISTORY: Past Surgical History:  Procedure Laterality Date   BIOPSY  04/05/2021   Procedure: BIOPSY;  Surgeon: Jenel Lucks, MD;  Location: [facility] ENDOSCOPY;  Service: Gastroenterology;;   COLONOSCOPY N/A 04/05/2021   Procedure: COLONOSCOPY;  Surgeon: Jenel Lucks, MD;  Location: [facility] ENDOSCOPY;  Service: Gastroenterology;  Laterality: N/A;   ESOPHAGOGASTRODUODENOSCOPY N/A 04/05/2021   Procedure: ESOPHAGOGASTRODUODENOSCOPY (EGD);  Surgeon: Jenel Lucks, MD;  Location: [facility] ENDOSCOPY;  Service: Gastroenterology;  Laterality: N/A;   LOWER EXTREMITY VENOGRAPHY Right 01/22/2022   Procedure: LOWER EXTREMITY VENOGRAPHY;  Surgeon: Leonie Douglas, MD;  Location: [facility] INVASIVE CV LAB;  Service: Cardiovascular;  Laterality: Right;   NO PAST SURGERIES     PERIPHERAL VASCULAR BALLOON ANGIOPLASTY  03/24/2021   Procedure: PERIPHERAL VASCULAR BALLOON ANGIOPLASTY;  Surgeon: Leonie Douglas, MD;  Location: [facility] INVASIVE CV LAB;  Service: Cardiovascular;;  bilateral common iliacs   PERIPHERAL VASCULAR THROMBECTOMY N/A 03/23/2021   Procedure: PERIPHERAL VASCULAR THROMBECTOMY;  Surgeon: Maeola Harman, MD;  Location: [facility] INVASIVE CV LAB;  Service: Cardiovascular;  Laterality: N/A;   PERIPHERAL VASCULAR THROMBECTOMY N/A 03/24/2021  Procedure: LYSIS RECHECK;  Surgeon: Cherre Robins, MD;  Location: [place] CV LAB;  Service: Cardiovascular;  Laterality: N/A;    I have reviewed the social history and family history with the patient and  they are unchanged from previous note.  ALLERGIES:  has No Known Allergies.  MEDICATIONS:  Current Outpatient Medications  Medication Sig Dispense Refill   acetaminophen (TYLENOL) 500 MG tablet Take 1,000 mg by mouth every 6 (six) hours as needed for mild pain.     acetaZOLAMIDE (DIAMOX) 125 MG tablet Take 2 tablets (250 mg total) by mouth 2 (two) times daily. 120 tablet 5   cetirizine (ZYRTEC) 10 MG tablet Take 10 mg by mouth daily as needed for allergies.     diclofenac Sodium (VOLTAREN) 1 % GEL Apply 4 g topically 4 (four) times daily as needed for pain.     docusate sodium (COLACE) 100 MG capsule Take 1 capsule (100 mg total) by mouth at bedtime. (Patient taking differently: Take 100 mg by mouth daily as needed for mild constipation.) 10 capsule 0   enoxaparin (LOVENOX) 100 MG/ML injection Inject 1 mL (100 mg total) into the skin at bedtime for 5 days. 5 mL 0   metoprolol succinate (TOPROL-XL) 25 MG 24 hr tablet Take 1 tablet (25 mg total) by mouth daily. (Patient not taking: Reported on 01/21/2022) 30 tablet 1   polyethylene glycol (MIRALAX / GLYCOLAX) 17 g packet Take 17 g by mouth daily. (Patient taking differently: Take 17 g by mouth daily as needed for mild constipation.) 14 each 0   Prenatal Vit-Fe Fumarate-FA (PRENATAL VITAMIN) 27-0.8 MG TABS Take 1 tablet by mouth daily. 30 tablet 12   tiZANidine (ZANAFLEX) 2 MG tablet Take 2 mg by mouth 2 (two) times daily as needed for muscle spasms.     Vitamin D, Ergocalciferol, (DRISDOL) 1.25 MG (50000 UNIT) CAPS capsule Take 50,000 Units by mouth every 7 (seven) days. Saturday     warfarin (COUMADIN) 10 MG tablet Take 1 tablet (10 mg total) by mouth at bedtime. 30 tablet 2   No current facility-administered medications for this visit.    PHYSICAL EXAMINATION: ECOG PERFORMANCE STATUS: 1 - Symptomatic but completely ambulatory  Vitals:   02/01/22 0931  BP: 113/68  Pulse: 87  Resp: 18  Temp: 97.9 F (36.6 C)  SpO2: 100%   Wt Readings  from Last 3 Encounters:  02/01/22 148 lb 2 oz (67.2 kg)  01/26/22 148 lb 1 oz (67.2 kg)  01/21/22 152 lb 8.9 oz (69.2 kg)     GENERAL:alert, no distress and comfortable SKIN: skin color normal, no rashes or significant lesions EYES: normal, Conjunctiva are pink and non-injected, sclera clear  HEART: (+) mild LE edema and erythema ABDOMEN: (+) tenderness to injection sites NEURO: alert & oriented x 3 with fluent speech  LABORATORY DATA:  I have reviewed the data as listed    Latest Ref Rng & Units 01/26/2022    8:59 AM 01/22/2022    5:24 AM 01/20/2022    7:03 PM  CBC  WBC 4.0 - 10.5 K/uL 6.5  5.7  5.3   Hemoglobin 12.0 - 15.0 g/dL 12.5  12.3  12.8   Hematocrit 36.0 - 46.0 % 40.4  39.8  41.4   Platelets 150 - 400 K/uL 304  284  344         Latest Ref Rng & Units 01/20/2022    7:03 PM 12/20/2021    9:02 AM 11/01/2021    2:12  AM  CMP  Glucose 70 - 99 mg/dL 90  97  88   BUN 6 - 20 mg/dL 7  8  6    Creatinine 0.44 - 1.00 mg/dL  3.76  2.83   Sodium 135 - 145 mmol/L 137  136  137   Potassium 3.5 - 5.1 mmol/L 4.1  3.5  4.0   Chloride 98 - 111 mmol/L 104  104  102   CO2 22 - 32 mmol/L 27  26  27    Calcium 8.9 - 10.3 mg/dL 9.4  9.3  9.7   Total Protein 6.5 - 8.1 g/dL  7.8    Total Bilirubin 0.3 - 1.2 mg/dL  0.3    Alkaline Phos 38 - 126 U/L  107    AST 15 - 41 U/L  21    ALT 0 - 44 U/L  37        RADIOGRAPHIC STUDIES: I have personally reviewed the radiological images as listed and agreed with the findings in the report. No results found.    No orders of the defined types were placed in this encounter.  All questions were answered. The patient knows to call the clinic with any problems, questions or concerns. No barriers to learning was detected. The total time spent in the appointment was 20 minutes.     1.51, MD 02/01/2022   I, Malachy Mood, am acting as scribe for 04/03/2022, MD.   I have reviewed the above documentation for accuracy and completeness, and  I agree with the above.

## 2022-02-01 NOTE — Progress Notes (Unsigned)
INR 4.3 Called to [facility] @ 950 by Murvin Donning 02/01/2022

## 2022-02-01 NOTE — Telephone Encounter (Signed)
Andrea Jacobson do you mind attempting to call this patient again for me today.  I tried to call and also sent her a message on  My chart but I want to make sure that she knows that Dr. Mosetta Putt wants her to stop using the Lovenox.  Do not take any coumadin today and resume with 10mg  daily starting tomorrow.  Please.  Thank You so much.    Pt advised and voiced understanding.

## 2022-02-02 ENCOUNTER — Telehealth: Payer: Self-pay | Admitting: Hematology

## 2022-02-02 ENCOUNTER — Ambulatory Visit: Payer: Self-pay

## 2022-02-02 NOTE — Telephone Encounter (Signed)
Scheduled follow-up appointment per 8/3 los. Patient is aware. 

## 2022-02-06 ENCOUNTER — Encounter: Payer: Self-pay | Admitting: Vascular Surgery

## 2022-02-06 ENCOUNTER — Ambulatory Visit (INDEPENDENT_AMBULATORY_CARE_PROVIDER_SITE_OTHER): Payer: Medicaid Other | Admitting: Vascular Surgery

## 2022-02-06 VITALS — BP 94/65 | HR 95 | Temp 98.5°F | Resp 20 | Ht 65.0 in | Wt 147.0 lb

## 2022-02-06 DIAGNOSIS — M7989 Other specified soft tissue disorders: Secondary | ICD-10-CM

## 2022-02-06 DIAGNOSIS — I872 Venous insufficiency (chronic) (peripheral): Secondary | ICD-10-CM | POA: Diagnosis not present

## 2022-02-06 DIAGNOSIS — I82521 Chronic embolism and thrombosis of right iliac vein: Secondary | ICD-10-CM

## 2022-02-06 NOTE — Progress Notes (Signed)
[facility] [place] PROGRESS NOTE  ASSESSMENT / PLAN: Andrea Hayes is a 31 y.o. female who returns to clinic for discussion of chronic venous occlusion of the right lower extremity.  This is currently causing only mild symptoms.  I encouraged her to avoid intervention as long as she can tolerate.  Hopefully collateralization will improve over time and her leg will be less bothersome to her.  I encouraged compression and elevation for symptomatic relief.  SUBJECTIVE: Returns to discuss venogram findings.  I reviewed the findings with her and explained the nature of chronic venous occlusion.  She describes some swelling in bilateral lower extremities, right worse than left.  The swelling is only minimally bothersome to her.  OBJECTIVE: BP 94/65 (BP Location: Left Arm, Patient Position: Sitting, Cuff Size: Normal)   Pulse 95   Temp 98.5 F (36.9 C)   Resp 20   Ht 5\' 5"  (1.651 m)   Wt 147 lb (66.7 kg)   SpO2 98%   BMI 24.46 kg/m   No complaints Regular rate and rhythm Unlabored breathing Mild swelling of bilateral lower extremities.     Latest Ref Rng & Units 01/26/2022    8:59 AM 01/22/2022    5:24 AM 01/20/2022    7:03 PM  CBC  WBC 4.0 - 10.5 K/uL 6.5  5.7  5.3   Hemoglobin 12.0 - 15.0 g/dL 01/22/2022  61.9  50.9   Hematocrit 36.0 - 46.0 % 40.4  39.8  41.4   Platelets 150 - 400 K/uL 304  284  344         Latest Ref Rng & Units 01/20/2022    7:03 PM 12/20/2021    9:02 AM 11/01/2021    2:12 AM  CMP  Glucose 70 - 99 mg/dL 90  97  88   BUN 6 - 20 mg/dL 7  8  6    Creatinine 0.44 - 1.00 mg/dL 01/01/2022   7.12   Sodium 135 - 145 mmol/L 137  136  137   Potassium 3.5 - 5.1 mmol/L 4.1  3.5  4.0   Chloride 98 - 111 mmol/L 104  104  102   CO2 22 - 32 mmol/L 27  26  27    Calcium 8.9 - 10.3 mg/dL 9.4  9.3  9.7   Total Protein 6.5 - 8.1 g/dL  7.8    Total Bilirubin 0.3 - 1.2 mg/dL  0.3    Alkaline Phos 38 - 126 U/L  107    AST 15 - 41 U/L  21    ALT 0 - 44 U/L  37       Estimated Creatinine Clearance: 91.7 mL/min (by C-G formula based on SCr of 0.64 mg/dL).  4.58. 0.99, MD [facility] Phone Number: [phone] 02/06/2022 12:17 PM

## 2022-02-07 ENCOUNTER — Inpatient Hospital Stay: Payer: Medicaid Other

## 2022-02-14 ENCOUNTER — Other Ambulatory Visit: Payer: Self-pay

## 2022-02-14 ENCOUNTER — Inpatient Hospital Stay: Payer: Medicaid Other

## 2022-02-14 DIAGNOSIS — I82220 Acute embolism and thrombosis of inferior vena cava: Secondary | ICD-10-CM

## 2022-02-14 DIAGNOSIS — I82509 Chronic embolism and thrombosis of unspecified deep veins of unspecified lower extremity: Secondary | ICD-10-CM | POA: Diagnosis not present

## 2022-02-14 LAB — PROTIME-INR
INR: 2.8 — ABNORMAL HIGH (ref 0.8–1.2)
Prothrombin Time: 29.5 seconds — ABNORMAL HIGH (ref 11.4–15.2)

## 2022-02-21 ENCOUNTER — Telehealth: Payer: Self-pay

## 2022-02-21 NOTE — Telephone Encounter (Signed)
This nurse reached out to patient per provider request.  Verified with patient what dose of Coumadin she is taking.  She states that she is currently taking 10 mg daily of Coumadin.  Advised patient to continue current dose and to keep her scheduled lab and office visit appointment on 8/30.  Patient acknowledged understanding.  No further questions or concerns at this time.

## 2022-02-21 NOTE — Telephone Encounter (Signed)
-----   Message from Pollyann Samples, NP sent at 02/14/2022  8:33 PM EDT ----- Please call pt to make sure she got my message: INR is in goal range, continue same dose coumadin. Next lab and follow up 8/30. Call sooner if needed.  Thanks, Clayborn Heron NP

## 2022-02-27 NOTE — Progress Notes (Deleted)
Andrea Hayes   Telephone:(336) [phone] Fax:(336) [phone]   Clinic Follow up Note   Patient Care Team: [name], MD as PCP - General (Internal Medicine) [name], DO as Consulting Physician (Neurology) Cherre Robins, MD as Consulting Physician (Vascular Surgery) Inge Rise, RN (Inactive) as Case Manager Truitt Merle, MD as Consulting Physician (Hematology) 02/27/2022  CHIEF COMPLAINT: Follow up recurrent DVT/PE  CURRENT THERAPY: Coumadin 10 mg po daily  INTERVAL HISTORY: Andrea Hayes returns for follow up as scheduled. Last seen by Dr. Burr Medico 02/01/22. She continues coumadin ***   REVIEW OF SYSTEMS:   Constitutional: Denies fevers, chills or abnormal weight loss Eyes: Denies blurriness of vision Ears, nose, mouth, throat, and face: Denies mucositis or sore throat Respiratory: Denies cough, dyspnea or wheezes Cardiovascular: Denies palpitation, chest discomfort or lower extremity swelling Gastrointestinal:  Denies nausea, heartburn or change in bowel habits Skin: Denies abnormal skin rashes Lymphatics: Denies new lymphadenopathy or easy bruising Neurological:Denies numbness, tingling or new weaknesses Behavioral/Psych: Mood is stable, no new changes  All other systems were reviewed with the patient and are negative.  MEDICAL HISTORY:  Past Medical History:  Diagnosis Date   DVT (deep venous thrombosis) ([place])    Stroke [facility])     SURGICAL HISTORY: Past Surgical History:  Procedure Laterality Date   BIOPSY  04/05/2021   Procedure: BIOPSY;  Surgeon: Daryel November, MD;  Location: [facility] ENDOSCOPY;  Service: Gastroenterology;;   COLONOSCOPY N/A 04/05/2021   Procedure: COLONOSCOPY;  Surgeon: Daryel November, MD;  Location: [facility] ENDOSCOPY;  Service: Gastroenterology;  Laterality: N/A;   ESOPHAGOGASTRODUODENOSCOPY N/A 04/05/2021   Procedure: ESOPHAGOGASTRODUODENOSCOPY (EGD);  Surgeon: Daryel November, MD;  Location: [place];  Service:  Gastroenterology;  Laterality: N/A;   LOWER EXTREMITY VENOGRAPHY Right 01/22/2022   Procedure: LOWER EXTREMITY VENOGRAPHY;  Surgeon: Cherre Robins, MD;  Location: [place] CV LAB;  Service: Cardiovascular;  Laterality: Right;   NO PAST SURGERIES     PERIPHERAL VASCULAR BALLOON ANGIOPLASTY  03/24/2021   Procedure: PERIPHERAL VASCULAR BALLOON ANGIOPLASTY;  Surgeon: Cherre Robins, MD;  Location: [place] CV LAB;  Service: Cardiovascular;;  bilateral common iliacs   PERIPHERAL VASCULAR THROMBECTOMY N/A 03/23/2021   Procedure: PERIPHERAL VASCULAR THROMBECTOMY;  Surgeon: Waynetta Sandy, MD;  Location: [place] CV LAB;  Service: Cardiovascular;  Laterality: N/A;   PERIPHERAL VASCULAR THROMBECTOMY N/A 03/24/2021   Procedure: LYSIS RECHECK;  Surgeon: Cherre Robins, MD;  Location: [place] CV LAB;  Service: Cardiovascular;  Laterality: N/A;    I have reviewed the social history and family history with the patient and they are unchanged from previous note.  ALLERGIES:  has No Known Allergies.  MEDICATIONS:  Current Outpatient Medications  Medication Sig Dispense Refill   acetaminophen (TYLENOL) 500 MG tablet Take 1,000 mg by mouth every 6 (six) hours as needed for mild pain.     acetaZOLAMIDE (DIAMOX) 125 MG tablet Take 2 tablets (250 mg total) by mouth 2 (two) times daily. 120 tablet 5   cetirizine (ZYRTEC) 10 MG tablet Take 10 mg by mouth daily as needed for allergies.     diclofenac Sodium (VOLTAREN) 1 % GEL Apply 4 g topically 4 (four) times daily as needed for pain.     docusate sodium (COLACE) 100 MG capsule Take 1 capsule (100 mg total) by mouth at bedtime. (Patient taking differently: Take 100 mg by mouth daily as needed for mild constipation.) 10 capsule 0   metoprolol succinate (TOPROL-XL)  25 MG 24 hr tablet Take 1 tablet (25 mg total) by mouth daily. 30 tablet 1   polyethylene glycol (MIRALAX / GLYCOLAX) 17 g packet Take 17 g by mouth daily. (Patient taking  differently: Take 17 g by mouth daily as needed for mild constipation.) 14 each 0   Prenatal Vit-Fe Fumarate-FA (PRENATAL VITAMIN) 27-0.8 MG TABS Take 1 tablet by mouth daily. 30 tablet 12   tiZANidine (ZANAFLEX) 2 MG tablet Take 2 mg by mouth 2 (two) times daily as needed for muscle spasms.     Vitamin D, Ergocalciferol, (DRISDOL) 1.25 MG (50000 UNIT) CAPS capsule Take 50,000 Units by mouth every 7 (seven) days. Saturday     warfarin (COUMADIN) 10 MG tablet Take 1 tablet (10 mg total) by mouth at bedtime. 30 tablet 2   No current facility-administered medications for this visit.    PHYSICAL EXAMINATION: ECOG PERFORMANCE STATUS: {CHL ONC ECOG PS:[id number]}  There were no vitals filed for this visit. There were no vitals filed for this visit.  GENERAL:alert, no distress and comfortable SKIN: skin color, texture, turgor are normal, no rashes or significant lesions EYES: normal, Conjunctiva are pink and non-injected, sclera clear OROPHARYNX:no exudate, no erythema and lips, buccal mucosa, and tongue normal  NECK: supple, thyroid normal size, non-tender, without nodularity LYMPH:  no palpable lymphadenopathy in the cervical, axillary or inguinal LUNGS: clear to auscultation and percussion with normal breathing effort HEART: regular rate & rhythm and no murmurs and no lower extremity edema ABDOMEN:abdomen soft, non-tender and normal bowel sounds Musculoskeletal:no cyanosis of digits and no clubbing  NEURO: alert & oriented x 3 with fluent speech, no focal motor/sensory deficits  LABORATORY DATA:  I have reviewed the data as listed    Latest Ref Rng & Units 01/26/2022    8:59 AM 01/22/2022    5:24 AM 01/20/2022    7:03 PM  CBC  WBC 4.0 - 10.5 K/uL 6.5  5.7  5.3   Hemoglobin 12.0 - 15.0 g/dL 40.1  02.7  25.3   Hematocrit 36.0 - 46.0 % 40.4  39.8  41.4   Platelets 150 - 400 K/uL 304  284  344         Latest Ref Rng & Units 01/20/2022    7:03 PM 12/20/2021    9:02 AM 11/01/2021    2:12  AM  CMP  Glucose 70 - 99 mg/dL 90  97  88   BUN 6 - 20 mg/dL 7  8  6    Creatinine 0.44 - 1.00 mg/dL  6.64  4.03   Sodium 135 - 145 mmol/L 137  136  137   Potassium 3.5 - 5.1 mmol/L 4.1  3.5  4.0   Chloride 98 - 111 mmol/L 104  104  102   CO2 22 - 32 mmol/L 27  26  27    Calcium 8.9 - 10.3 mg/dL 9.4  9.3  9.7   Total Protein 6.5 - 8.1 g/dL  7.8    Total Bilirubin 0.3 - 1.2 mg/dL  0.3    Alkaline Phos 38 - 126 U/L  107    AST 15 - 41 U/L  21    ALT 0 - 44 U/L  37        RADIOGRAPHIC STUDIES: I have personally reviewed the radiological images as listed and agreed with the findings in the report. No results found.   ASSESSMENT & PLAN:  No problem-specific Assessment & Plan notes found for this encounter.   No orders  of the defined types were placed in this encounter.  All questions were answered. The patient knows to call the clinic with any problems, questions or concerns. No barriers to learning was detected. I spent {CHL ONC TIME VISIT - WGNFA:[id number]} counseling the patient face to face. The total time spent in the appointment was {CHL ONC TIME VISIT - ONGEX:[id number]} and more than 50% was on counseling and review of test results     Andrea Samples, NP 02/27/22

## 2022-02-28 ENCOUNTER — Inpatient Hospital Stay: Payer: Medicaid Other | Admitting: Nurse Practitioner

## 2022-02-28 ENCOUNTER — Inpatient Hospital Stay: Payer: Medicaid Other

## 2022-03-06 ENCOUNTER — Telehealth: Payer: Self-pay | Admitting: Nurse Practitioner

## 2022-03-06 NOTE — Telephone Encounter (Signed)
Scheduled per 9/1 in basket, attempted to call but no voicemail, will try to call again

## 2022-03-06 NOTE — Progress Notes (Signed)
[place]   Telephone:(336) [phone] Fax:(336) [phone]   Clinic Follow up Note   Patient Care Team: [name], MD as PCP - General (Internal Medicine) [name], DO as Consulting Physician (Neurology) Cherre Robins, MD as Consulting Physician (Vascular Surgery) Inge Rise, RN (Inactive) as Case Manager Truitt Merle, MD as Consulting Physician (Hematology) 03/08/2022  CHIEF COMPLAINT: Follow-up recurrent DVT/PE  CURRENT THERAPY: Coumadin 10 mg p.o. once daily  INTERVAL HISTORY: [name] returns for follow-up, last seen by Dr. Burr Medico 02/01/2022, she missed a visit last week. She is doing well, no significant changes. Compliant with coumadin, no new medication or dietary changes. Leg swelling fluctuates, no pain. She wears compression stockings at work. She has an open sore on the back of right knee which hurts when she drives. Denies fever/chills. She denies abnormal bleeding, cough, chest pain, dyspnea, headaches, or other specific concerns.   All other systems were reviewed with the patient and are negative.  MEDICAL HISTORY:  Past Medical History:  Diagnosis Date   DVT (deep venous thrombosis) ([facility])    Stroke [facility])     SURGICAL HISTORY: Past Surgical History:  Procedure Laterality Date   BIOPSY  04/05/2021   Procedure: BIOPSY;  Surgeon: Daryel November, MD;  Location: [facility] ENDOSCOPY;  Service: Gastroenterology;;   COLONOSCOPY N/A 04/05/2021   Procedure: COLONOSCOPY;  Surgeon: Daryel November, MD;  Location: [facility] ENDOSCOPY;  Service: Gastroenterology;  Laterality: N/A;   ESOPHAGOGASTRODUODENOSCOPY N/A 04/05/2021   Procedure: ESOPHAGOGASTRODUODENOSCOPY (EGD);  Surgeon: Daryel November, MD;  Location: [place];  Service: Gastroenterology;  Laterality: N/A;   LOWER EXTREMITY VENOGRAPHY Right 01/22/2022   Procedure: LOWER EXTREMITY VENOGRAPHY;  Surgeon: Cherre Robins, MD;  Location: [place] CV LAB;  Service: Cardiovascular;  Laterality:  Right;   NO PAST SURGERIES     PERIPHERAL VASCULAR BALLOON ANGIOPLASTY  03/24/2021   Procedure: PERIPHERAL VASCULAR BALLOON ANGIOPLASTY;  Surgeon: Cherre Robins, MD;  Location: [place] CV LAB;  Service: Cardiovascular;;  bilateral common iliacs   PERIPHERAL VASCULAR THROMBECTOMY N/A 03/23/2021   Procedure: PERIPHERAL VASCULAR THROMBECTOMY;  Surgeon: Waynetta Sandy, MD;  Location: [place] CV LAB;  Service: Cardiovascular;  Laterality: N/A;   PERIPHERAL VASCULAR THROMBECTOMY N/A 03/24/2021   Procedure: LYSIS RECHECK;  Surgeon: Cherre Robins, MD;  Location: [place] CV LAB;  Service: Cardiovascular;  Laterality: N/A;    I have reviewed the social history and family history with the patient and they are unchanged from previous note.  ALLERGIES:  has No Known Allergies.  MEDICATIONS:  Current Outpatient Medications  Medication Sig Dispense Refill   neomycin-bacitracin-polymyxin [phone] OINT Apply 1 Application topically 2 (two) times daily. 15 g 0   acetaminophen (TYLENOL) 500 MG tablet Take 1,000 mg by mouth every 6 (six) hours as needed for mild pain.     acetaZOLAMIDE (DIAMOX) 125 MG tablet Take 2 tablets (250 mg total) by mouth 2 (two) times daily. 120 tablet 5   cetirizine (ZYRTEC) 10 MG tablet Take 10 mg by mouth daily as needed for allergies.     diclofenac Sodium (VOLTAREN) 1 % GEL Apply 4 g topically 4 (four) times daily as needed for pain.     docusate sodium (COLACE) 100 MG capsule Take 1 capsule (100 mg total) by mouth at bedtime. (Patient taking differently: Take 100 mg by mouth daily as needed for mild constipation.) 10 capsule 0   metoprolol succinate (TOPROL-XL) 25 MG 24 hr tablet Take 1  tablet (25 mg total) by mouth daily. 30 tablet 1   polyethylene glycol (MIRALAX / GLYCOLAX) 17 g packet Take 17 g by mouth daily. (Patient taking differently: Take 17 g by mouth daily as needed for mild constipation.) 14 each 0   Prenatal Vit-Fe Fumarate-FA (PRENATAL  VITAMIN) 27-0.8 MG TABS Take 1 tablet by mouth daily. 30 tablet 12   tiZANidine (ZANAFLEX) 2 MG tablet Take 2 mg by mouth 2 (two) times daily as needed for muscle spasms.     Vitamin D, Ergocalciferol, (DRISDOL) 1.25 MG (50000 UNIT) CAPS capsule Take 50,000 Units by mouth every 7 (seven) days. Saturday     warfarin (COUMADIN) 10 MG tablet Take 1 tablet (10 mg total) by mouth at bedtime. 30 tablet 2   No current facility-administered medications for this visit.    PHYSICAL EXAMINATION: ECOG PERFORMANCE STATUS: 1 - Symptomatic but completely ambulatory  Vitals:   03/08/22 1024  BP: 106/69  Pulse: 70  Resp: 15  Temp: 98.2 F (36.8 C)  SpO2: 100%   Filed Weights   03/08/22 1024  Weight: 149 lb 4.8 oz (67.7 kg)    GENERAL:alert, no distress and comfortable SKIN: no rash. Small open skin behind R knee, no swelling, erythema, drainage, or warmth  EYES:  sclera clear LUNGS: clear with normal breathing effort HEART: regular rate & rhythm, b/l lower extremity swelling R>L NEURO: alert & oriented x 3 with fluent speech, no focal motor/sensory deficits  LABORATORY DATA:  I have reviewed the data as listed    Latest Ref Rng & Units 03/08/2022    9:17 AM 01/26/2022    8:59 AM 01/22/2022    5:24 AM  CBC  WBC 4.0 - 10.5 K/uL 5.0  6.5  5.7   Hemoglobin 12.0 - 15.0 g/dL 16.1  09.6  04.5   Hematocrit 36.0 - 46.0 % 38.1  40.4  39.8   Platelets 150 - 400 K/uL 380  304  284         Latest Ref Rng & Units 01/20/2022    7:03 PM 12/20/2021    9:02 AM 11/01/2021    2:12 AM  CMP  Glucose 70 - 99 mg/dL 90  97  88   BUN 6 - 20 mg/dL 7  8  6    Creatinine 0.44 - 1.00 mg/dL  4.09  8.11   Sodium 135 - 145 mmol/L 137  136  137   Potassium 3.5 - 5.1 mmol/L 4.1  3.5  4.0   Chloride 98 - 111 mmol/L 104  104  102   CO2 22 - 32 mmol/L 27  26  27    Calcium 8.9 - 10.3 mg/dL 9.4  9.3  9.7   Total Protein 6.5 - 8.1 g/dL  7.8    Total Bilirubin 0.3 - 1.2 mg/dL  0.3    Alkaline Phos 38 - 126 U/L  107     AST 15 - 41 U/L  21    ALT 0 - 44 U/L  37        RADIOGRAPHIC STUDIES: I have personally reviewed the radiological images as listed and agreed with the findings in the report. No results found.   ASSESSMENT & PLAN: 31 year old female   1.  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  -She developed acute lower extremity DVT on Xarelto, concerning for Xarelto failure.  Xarelto was stopped and she was treated with heparin in the hospital, and transition to Coumadin with INR goal 2-3 -INR had been difficult to keep therapeutic, she missed 3 doses and developed acute RLE DVT, discharged on lovenox bridge 01/22/22. -She developed hematuria and supratherapeutic INR, lovenox was stopped 8/3. INR has been in goal since then -She was seen by vascular surgeon Dr. 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 -Ms. Skirvin appears stable. No signs of recurrent thrombosis. She has a small open wound on back of R knee, I prescribed topical antibiotic ointment. I reviewed s/sx of infection. Otherwise doing well.  -CBC reviewed, Hgb 11.9; INR 2.3 which is therapeutic. Continue same dose daily coumadin 10 mg po -INR q2 weeks x4, f/up in 8 weeks or sooner if needed. She knows what to monitor   PLAN: -Lab reviewed, INR therapeutic -Continue coumadin 10 mg po once daily -INR q2 weeks  x4 -Rx: topical abx for open R leg sore -F/up in 8 weeks, or sooner if needed     All questions were answered. The patient knows to call the clinic with any problems, questions or concerns. No barriers to learning were detected.      Pollyann Samples, NP 03/08/22

## 2022-03-08 ENCOUNTER — Inpatient Hospital Stay (HOSPITAL_BASED_OUTPATIENT_CLINIC_OR_DEPARTMENT_OTHER): Payer: Medicaid Other | Admitting: Nurse Practitioner

## 2022-03-08 ENCOUNTER — Other Ambulatory Visit: Payer: Self-pay

## 2022-03-08 ENCOUNTER — Inpatient Hospital Stay: Payer: Medicaid Other | Attending: Nurse Practitioner

## 2022-03-08 ENCOUNTER — Encounter: Payer: Self-pay | Admitting: Nurse Practitioner

## 2022-03-08 VITALS — BP 106/69 | HR 70 | Temp 98.2°F | Resp 15 | Wt 149.3 lb

## 2022-03-08 DIAGNOSIS — Z86718 Personal history of other venous thrombosis and embolism: Secondary | ICD-10-CM | POA: Diagnosis not present

## 2022-03-08 DIAGNOSIS — I82501 Chronic embolism and thrombosis of unspecified deep veins of right lower extremity: Secondary | ICD-10-CM | POA: Insufficient documentation

## 2022-03-08 DIAGNOSIS — Z7901 Long term (current) use of anticoagulants: Secondary | ICD-10-CM | POA: Insufficient documentation

## 2022-03-08 DIAGNOSIS — I82220 Acute embolism and thrombosis of inferior vena cava: Secondary | ICD-10-CM

## 2022-03-08 LAB — CBC WITH DIFFERENTIAL (CANCER CENTER ONLY)
Abs Immature Granulocytes: 0.01 10*3/uL (ref 0.00–0.07)
Basophils Absolute: 0.1 10*3/uL (ref 0.0–0.1)
Basophils Relative: 1 %
Eosinophils Absolute: 0.1 10*3/uL (ref 0.0–0.5)
Eosinophils Relative: 3 %
HCT: 38.1 % (ref 36.0–46.0)
Hemoglobin: 11.9 g/dL — ABNORMAL LOW (ref 12.0–15.0)
Immature Granulocytes: 0 %
Lymphocytes Relative: 31 %
Lymphs Abs: 1.6 10*3/uL (ref 0.7–4.0)
MCH: 25 pg — ABNORMAL LOW (ref 26.0–34.0)
MCHC: 31.2 g/dL (ref 30.0–36.0)
MCV: 80 fL (ref 80.0–100.0)
Monocytes Absolute: 0.6 10*3/uL (ref 0.1–1.0)
Monocytes Relative: 12 %
Neutro Abs: 2.7 10*3/uL (ref 1.7–7.7)
Neutrophils Relative %: 53 %
Platelet Count: 380 10*3/uL (ref 150–400)
RBC: 4.76 MIL/uL (ref 3.87–5.11)
RDW: 15.2 % (ref 11.5–15.5)
WBC Count: 5 10*3/uL (ref 4.0–10.5)
nRBC: 0 % (ref 0.0–0.2)

## 2022-03-08 LAB — PROTIME-INR
INR: 2.3 — ABNORMAL HIGH (ref 0.8–1.2)
Prothrombin Time: 25.5 seconds — ABNORMAL HIGH (ref 11.4–15.2)

## 2022-03-08 MED ORDER — TRIPLE ANTIBIOTIC 3.5-400-5000 EX OINT: 1.0000 | TOPICAL_OINTMENT | Freq: Two times a day (BID) | CUTANEOUS | 0 refills | Status: DC

## 2022-03-08 MED ORDER — WARFARIN SODIUM 10 MG PO TABS: 10.0000 mg | ORAL_TABLET | Freq: Every day | ORAL | 2 refills | Status: DC

## 2022-03-09 ENCOUNTER — Other Ambulatory Visit: Payer: Self-pay | Admitting: Obstetrics and Gynecology

## 2022-03-09 NOTE — Patient Instructions (Signed)
Hi Ms. Beynon, it was a pleasure speaking with you today-have a nice day!  Ms. Fojtik was given information about Medicaid Managed Care team care coordination services as a part of their [facility] Medicaid benefit. Joyleen Gullo verbally consented to engagement with the [facility] Managed Care team.   If you are experiencing a medical emergency, please call 911 or report to your local emergency department or urgent care.   If you have a non-emergency medical problem during routine business hours, please contact your provider's office and ask to speak with a nurse.   For questions related to your [facility] health plan, please call: [phone] or go here:[email or web address]  If you would like to schedule transportation through your [facility] plan, please call the following number at least 2 days in advance of your appointment: [phone].  You can also use the MTM portal or MTM mobile app to manage your rides. For the portal, please go to [email or web address].  Call the [place] at [phone], at any time, 24 hours a day, 7 days a week. If you are in danger or need immediate medical attention call 911.  If you would like help to quit smoking, call 1-800-QUIT-NOW (325)415-6524) OR Espaol: 1-855-Djelo-Ya (6-144-315-4008) o para ms informacin haga clic aqu or Text READY to 200-400 to register via text  Ms. Vezina - following are the goals we discussed in your visit today:   Goals Addressed    Timeframe:  Long-Range Goal Priority:  High Start Date:    03/09/22                         Expected End Date:     ongoing                  Follow Up Date 04/13/22    - practice safe sex - schedule appointment for flu shot - schedule appointment for vaccines needed due to my age or health - schedule recommended health tests (blood work, mammogram, colonoscopy, pap test) - schedule and keep appointment for annual check-up    Why is this important?    Screening tests can find diseases early when they are easier to treat.  Your doctor or nurse will talk with you about which tests are important for you.  Getting shots for common diseases like the flu and shingles will help prevent them.    Patient verbalizes understanding of instructions and care plan provided today and agrees to view in [place]. Active MyChart status and patient understanding of how to access instructions and care plan via MyChart confirmed with patient.     The Managed Medicaid care management team will reach out to the patient again over the next 30 business  days.  The  Patient  has been provided with contact information for the Managed Medicaid care management team and has been advised to call with any health related questions or concerns.   Aida Raider RN, BSN [place] Management Coordinator - Managed Medicaid High Risk [phone]    Following is a copy of your plan of care:  Care Plan : [place] of Care  Updates made by Gayla Medicus, RN since 03/09/2022 12:00 AM     Problem: Chronic Disease Management and Care Coordination Needs for Deep Vein Thrombosis/stroke management and HTN   Priority: High  Onset Date: 06/06/2021     Long-Range Goal: Development of Plan of Care for Management and  Care Coordination Needs (DVT/stroke management and HTN)   Start Date: 06/06/2021  Expected End Date: 06/08/2022  Recent Progress: On track  Priority: High  Note:   Current Barriers:  Knowledge Deficits related to plan of care for management of HTN and deep vein thrombosis, stroke  Care Coordination needs related to Limited education about deep vein thrombosis, anticoagulants, strokes* Chronic Disease Management support and education needs related to HTN and deep vein thrombosis, stroke.  03/09/22:  Patient with lower extremity swelling-seen and evaluated by provider yesterday and WNL-has f/u appt scheduled-no further problems  today  RN CM Clinical Goal(s):  Patient will verbalize understanding of plan for management of HTN and recurrent Deep Vein Thrombosis (DVT), stroke as evidenced by good control of HTN and recurrent DVT verbalize basic understanding of  HTN and deep vein thrombosis, stroke disease process and self health management plan as evidenced by for deep vein thrombosis, stroke to include use of anticoagulants and diet and hypertension take all medications exactly as prescribed and will call provider for medication related questions as evidenced by medication compliance. attend all scheduled medical appointments: weekly labs for INR due to daily coumadin;  as evidenced by attending all scheduled appointments demonstrate Improved and Ongoing adherence to prescribed treatment plan for HTN and deep vein thrombosis, stroke as evidenced by therapeutic INR values continue to work with RN Care Manager to address care management and care coordination needs related to  HTN and recurrent DVT, stroke prevention as evidenced by adherence to CM Team Scheduled appointments through collaboration with RN Care manager, provider, and care team.   Interventions: Follow up outreaches to review labs, review upcoming appointments, to educate and answer questions Inter-disciplinary care team collaboration (see longitudinal plan of care) Evaluation of current treatment plan related to  self management and patient's adherence to plan as established by provider Follow up with patient to assess for any worsening symptoms related to DVT or care coordination needs Reviewed recent labs, recent blood pressure readings and discussed importance of hydration and it's affect on blood pressure. No reported leg pain, no headaches Reviewed upcoming appointments with patient.  Deep vein thrombosis, stroke Goal on track:  Yes. Evaluation of current treatment plan related to  deep vein thrombosis, stroke , Limited education about home management,  medicine, diet related to deep vein thrombosis, stroke* self-management and patient's adherence to plan as established by provider. Discussed plans with patient for ongoing care management follow up and provided patient with direct contact information for care management team Evaluation of current treatment plan related to deep vein thrombosis, stroke and patient's adherence to plan as established by provider Provided education to patient re: DVT, Stroke, anticoagulants, bleeding risks, INR values with therapeutic range of 2.5-3.5 Reviewed medications with patient and discussed action plan, worsening symptoms to call MD office for any changes Reviewed scheduled/upcoming provider appointments including Discussed plans with patient for ongoing care management follow up and provided patient with direct contact information for care management team Patient continues to have weekly lab work drawn for INR.  Patient currently on Coumadin 10 mg daily and is in therapeutic range of 2.5 - 3.5 Provided continued education on signs & symptoms of DVT: swelling, redness and warmth/heat at site.  Discussed that Left lower leg edema was noted upon ED visit on 11/01/2021.   Hypertension Interventions:  (Status:  Goal on track:  Yes.) Long Term Goal Last practice recorded BP readings:  BP Readings from Last 3 Encounters:  11/01/21 112/60  09/27/21 109/76  08/02/21 103/64    Most recent eGFR/CrCl: No results found for: EGFR  No components found for: CRCL  Evaluation of current treatment plan related to hypertension self management and patient's adherence to plan as established by provider Reviewed medications with patient and discussed importance of compliance Discussed plans with patient for ongoing care management follow up and provided patient with direct contact information for care management team Reviewed scheduled/upcoming provider appointments including:   Reinforced education with patient on the importance  of staying hydrated and it's direct affect on blood pressure.  Patient states she drinks "A Lot" of fluids throughout the day.  Patient denies any light headed or dizziness.   Patient verbalized understanding. Discussed recent ED visit for intense headache on 11/01/2021.  Patient was treated and released the same day.  Patient states she has had no further headaches. Discussed recent order for x-ray (MRCP) of abdomen due to continued elevated liver enzymes.  Patient states she contacted radiology yesterday and is awaiting return phone call to make an appointment.  Patient Goals/Self-Care Activities: Take all medications as prescribed Attend all scheduled provider appointments Perform all self care activities independently  Perform IADL's (shopping, preparing meals, housekeeping, managing finances) independently Call provider office for new concerns or questions   Follow Up Plan:  The patient has been provided contact information via My Chart link for the care management team and has been advised to call with any health related questions or concerns.

## 2022-03-09 NOTE — Patient Outreach (Signed)
Medicaid Managed Care   Nurse Care Manager Note  03/09/2022 Name:  Andrea Hayes MRN:  [id number] DOB:  1990/07/11  Andrea Hayes is an 31 y.o. year old female who is a primary patient of Andrea Ebbs, MD.  The Medicaid Managed Care Coordination team was consulted for assistance with:    Chronic healthcare management needs, HTN, recurrent DVTs  Andrea Hayes was given information about Medicaid Managed Care Coordination team services today. Andrea Hayes Patient agreed to services and verbal consent obtained.  Engaged with patient by telephone for follow up visit in response to provider referral for case management and/or care coordination services.   Assessments/Interventions:  Review of past medical history, allergies, medications, health status, including review of consultants reports, laboratory and other test data, was performed as part of comprehensive evaluation and provision of chronic care management services.  SDOH (Social Determinants of Health) assessments and interventions performed: SDOH Interventions    Flowsheet Row Patient Outreach Telephone from 03/09/2022 in [place] Patient Outreach Telephone from 06/06/2021 in [place] Patient Outreach Telephone from 06/02/2021 in [place] Coordination  SDOH Interventions     Food Insecurity Interventions -- Intervention Not Indicated Intervention Not Indicated  Housing Interventions -- Intervention Not Indicated Intervention Not Indicated  Transportation Interventions -- Intervention Not Indicated Intervention Not Indicated  Utilities Interventions Intervention Not Indicated -- --  Financial Strain Interventions -- Intervention Not Indicated Intervention Not Indicated  Physical Activity Interventions -- Intervention Not Indicated  [Patient works full time 1PM to 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  Currently patient does not  have time to exercise.] --  Stress Interventions -- Intervention Not Indicated Intervention Not Indicated  [has newborn]  Social Connections Interventions -- Intervention Not Indicated Intervention Not Indicated      Care Plan  No Known Allergies  Medications Reviewed Today     Reviewed by Andrea Medicus, RN (Registered Nurse) on 03/09/22 at 1042  Med List Status: <None>   Medication Order Taking? Sig Documenting Provider Last Dose Status Informant  acetaminophen (TYLENOL) 500 MG tablet [id number] No Take 1,000 mg by mouth every 6 (six) hours as needed for mild pain. [provider] Taking Active Self  acetaZOLAMIDE (DIAMOX) 125 MG tablet [id number] No Take 2 tablets (250 mg total) by mouth 2 (two) times daily. Andrea Partridge, DO Taking Active Self  cetirizine (ZYRTEC) 10 MG tablet [id number] No Take 10 mg by mouth daily as needed for allergies. [provider] Taking Active Self  diclofenac Sodium (VOLTAREN) 1 % GEL [id number] No Apply 4 g topically 4 (four) times daily as needed for pain. [provider] Taking Active Self  docusate sodium (COLACE) 100 MG capsule [id number] No Take 1 capsule (100 mg total) by mouth at bedtime.  Patient taking differently: Take 100 mg by mouth daily as needed for mild constipation.   Andrea Hamman, MD Taking Active Self  metoprolol succinate (TOPROL-XL) 25 MG 24 hr tablet [id number] No Take 1 tablet (25 mg total) by mouth daily. Andrea Nimrod, MD Taking Active Self  neomycin-bacitracin-polymyxin [phone] OINT [id number]  Apply 1 Application topically 2 (two) times daily. Andrea Feeling, NP  Active   polyethylene glycol (MIRALAX / GLYCOLAX) 17 g packet [id number] No Take 17 g by mouth daily.  Patient taking differently: Take 17 g by mouth daily as needed for mild constipation.   Andrea Hamman, MD Taking Active Self  Prenatal Vit-Fe Fumarate-FA (  PRENATAL VITAMIN) 27-0.8 MG TABS [id number] No Take 1 tablet by mouth daily. Andrea Hayes, CNM Taking Active Self  tiZANidine (ZANAFLEX) 2 MG tablet [id number] No Take 2 mg by mouth 2 (two) times daily as needed for muscle spasms. [provider] Taking Active Self  Vitamin D, Ergocalciferol, (DRISDOL) 1.25 MG (50000 UNIT) CAPS capsule [id number] No Take 50,000 Units by mouth every 7 (seven) days. Saturday [provider] Taking Active Self  warfarin (COUMADIN) 10 MG tablet [id number]  Take 1 tablet (10 mg total) by mouth at bedtime. 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   Acute respiratory failure with hypoxia (HCC) 01/26/2021   Chronic right arterial ischemic stroke, MCA (middle cerebral artery) 01/23/2021   Pulmonary embolism ([place]) 01/23/2021   Leukocytosis 01/23/2021   Thrombocytopenia ([place]) 01/23/2021   Elevated liver enzymes 01/23/2021   Chest pain 01/23/2021   Back pain 01/23/2021   Vaginal delivery 12/05/2020   Headache 11/28/2020   HTN (hypertension) 11/28/2020   Severe preeclampsia, delivered 11/04/2020   IUGR (intrauterine growth restriction) affecting care of mother 10/31/2020   Thrombocytopenia affecting pregnancy ([place]) 05/27/2020   Acute right arterial ischemic stroke, middle cerebral artery (MCA) (HCC)    Dural venous sinus thrombosis    Idiopathic intracranial hypertension    Cerebral venous thrombosis 07/24/2018   Conditions to be addressed/monitored per PCP order:   HTN, recurrent DVTs  Care Plan : RN Care Manager Plan of Care  Updates made by Andrea Medicus, RN since 03/09/2022 12:00 AM     Problem: Chronic Disease Management and Care Coordination Needs  for Deep Vein Thrombosis/stroke management and HTN   Priority: High  Onset Date: 06/06/2021     Long-Range Goal: Development of Plan of Care for Management and Care Coordination Needs (DVT/stroke management and HTN)   Start Date: 06/06/2021  Expected End Date: 06/08/2022  Recent Progress: On track  Priority: High  Note:   Current Barriers:  Knowledge Deficits related to plan of care for management of HTN and deep vein thrombosis, stroke  Care Coordination needs related to Limited education about deep vein thrombosis, anticoagulants, strokes* Chronic Disease Management support and education needs related to HTN and deep vein thrombosis, stroke.  03/09/22:  Patient with lower extremity swelling-seen and evaluated by provider yesterday and WNL-has f/u appt scheduled-no further problems today  RN CM Clinical Goal(s):  Patient will verbalize understanding of plan for management of HTN and recurrent Deep Vein Thrombosis (DVT), stroke as evidenced by good control of HTN and recurrent DVT verbalize basic understanding of  HTN and deep vein thrombosis, stroke disease process and self health management plan as evidenced by for deep vein thrombosis, stroke to include use of anticoagulants and diet and hypertension take all medications exactly as prescribed and will call provider for medication related questions as evidenced by medication compliance. attend all scheduled medical appointments: weekly labs for INR due to daily coumadin;  as evidenced by attending all scheduled appointments demonstrate Improved and Ongoing adherence to prescribed treatment plan for HTN and deep vein  thrombosis, stroke as evidenced by therapeutic INR values continue to work with RN Care Manager to address care management and care coordination needs related to  HTN and recurrent DVT, stroke prevention as evidenced by adherence to CM Team Scheduled appointments through collaboration with RN Care manager, provider, and care team.    Interventions: Follow up outreaches to review labs, review upcoming appointments, to educate and answer questions Inter-disciplinary care team collaboration (see longitudinal plan of care) Evaluation of current treatment plan related to  self management and patient's adherence to plan as established by provider Follow up with patient to assess for any worsening symptoms related to DVT or care coordination needs Reviewed recent labs, recent blood pressure readings and discussed importance of hydration and it's affect on blood pressure. No reported leg pain, no headaches Reviewed upcoming appointments with patient.  Deep vein thrombosis, stroke Goal on track:  Yes. Evaluation of current treatment plan related to  deep vein thrombosis, stroke , Limited education about home management, medicine, diet related to deep vein thrombosis, stroke* self-management and patient's adherence to plan as established by provider. Discussed plans with patient for ongoing care management follow up and provided patient with direct contact information for care management team Evaluation of current treatment plan related to deep vein thrombosis, stroke and patient's adherence to plan as established by provider Provided education to patient re: DVT, Stroke, anticoagulants, bleeding risks, INR values with therapeutic range of 2.5-3.5 Reviewed medications with patient and discussed action plan, worsening symptoms to call MD office for any changes Reviewed scheduled/upcoming provider appointments including Discussed plans with patient for ongoing care management follow up and provided patient with direct contact information for care management team Patient continues to have weekly lab work drawn for INR.  Patient currently on Coumadin 10 mg daily and is in therapeutic range of 2.5 - 3.5 Provided continued education on signs & symptoms of DVT: swelling, redness and warmth/heat at site.  Discussed that Left lower leg edema  was noted upon ED visit on 11/01/2021.   Hypertension Interventions:  (Status:  Goal on track:  Yes.) Long Term Goal Last practice recorded BP readings:  BP Readings from Last 3 Encounters:  11/01/21 112/60  09/27/21 109/76  08/02/21 103/64    Most recent eGFR/CrCl: No results found for: EGFR  No components found for: CRCL  Evaluation of current treatment plan related to hypertension self management and patient's adherence to plan as established by provider Reviewed medications with patient and discussed importance of compliance Discussed plans with patient for ongoing care management follow up and provided patient with direct contact information for care management team Reviewed scheduled/upcoming provider appointments including:   Reinforced education with patient on the importance of staying hydrated and it's direct affect on blood pressure.  Patient states she drinks "A Lot" of fluids throughout the day.  Patient denies any light headed or dizziness.   Patient verbalized understanding. Discussed recent ED visit for intense headache on 11/01/2021.  Patient was treated and released the same day.  Patient states she has had no further headaches. Discussed recent order for x-ray (MRCP) of abdomen due to continued elevated liver enzymes.  Patient states she contacted radiology yesterday and is awaiting return phone call to make an appointment.  Patient Goals/Self-Care Activities: Take all medications as prescribed Attend all scheduled provider appointments Perform all self care activities independently  Perform IADL's (shopping, preparing meals, housekeeping, managing finances) independently Call provider office for new concerns or questions   Follow Up Plan:  The patient has been provided contact information via My Chart link for the care management team and has been advised to call with any health related questions or concerns.    Long-Range Goal: Establish Plan of Care for Chronic Disease  Management Needs   Priority: High  Note:   Timeframe:  Long-Range Goal Priority:  High Start Date:    03/09/22                         Expected End Date:     ongoing                  Follow Up Date 04/13/22    - practice safe sex - schedule appointment for flu shot - schedule appointment for vaccines needed due to my age or health - schedule recommended health tests (blood work, mammogram, colonoscopy, pap test) - schedule and keep appointment for annual check-up    Why is this important?   Screening tests can find diseases early when they are easier to treat.  Your doctor or nurse will talk with you about which tests are important for you.  Getting shots for common diseases like the flu and shingles will help prevent them.     Follow Up:  Patient agrees to Care Plan and Follow-up.  Plan: The Managed Medicaid care management team will reach out to the patient again over the next 30 business  days. and The  Patient has been provided with contact information for the Managed Medicaid care management team and has been advised to call with any health related questions or concerns.  Date/time of next scheduled RN care management/care coordination outreach: 04/13/22 at 10.

## 2022-03-22 ENCOUNTER — Inpatient Hospital Stay: Payer: Medicaid Other

## 2022-04-05 ENCOUNTER — Other Ambulatory Visit: Payer: Self-pay

## 2022-04-05 ENCOUNTER — Inpatient Hospital Stay: Payer: Medicaid Other | Attending: Nurse Practitioner

## 2022-04-05 ENCOUNTER — Telehealth: Payer: Self-pay

## 2022-04-05 ENCOUNTER — Encounter: Payer: Self-pay | Admitting: *Deleted

## 2022-04-05 DIAGNOSIS — I82501 Chronic embolism and thrombosis of unspecified deep veins of right lower extremity: Secondary | ICD-10-CM | POA: Diagnosis present

## 2022-04-05 DIAGNOSIS — Z7901 Long term (current) use of anticoagulants: Secondary | ICD-10-CM | POA: Insufficient documentation

## 2022-04-05 DIAGNOSIS — I82220 Acute embolism and thrombosis of inferior vena cava: Secondary | ICD-10-CM

## 2022-04-05 DIAGNOSIS — Z86711 Personal history of pulmonary embolism: Secondary | ICD-10-CM | POA: Insufficient documentation

## 2022-04-05 LAB — CBC WITH DIFFERENTIAL (CANCER CENTER ONLY)
Abs Immature Granulocytes: 0.02 10*3/uL (ref 0.00–0.07)
Basophils Absolute: 0.1 10*3/uL (ref 0.0–0.1)
Basophils Relative: 1 %
Eosinophils Absolute: 0.2 10*3/uL (ref 0.0–0.5)
Eosinophils Relative: 3 %
HCT: 38.5 % (ref 36.0–46.0)
Hemoglobin: 11.7 g/dL — ABNORMAL LOW (ref 12.0–15.0)
Immature Granulocytes: 0 %
Lymphocytes Relative: 29 %
Lymphs Abs: 1.6 10*3/uL (ref 0.7–4.0)
MCH: 24.6 pg — ABNORMAL LOW (ref 26.0–34.0)
MCHC: 30.4 g/dL (ref 30.0–36.0)
MCV: 81.1 fL (ref 80.0–100.0)
Monocytes Absolute: 0.6 10*3/uL (ref 0.1–1.0)
Monocytes Relative: 11 %
Neutro Abs: 3 10*3/uL (ref 1.7–7.7)
Neutrophils Relative %: 56 %
Platelet Count: 380 10*3/uL (ref 150–400)
RBC: 4.75 MIL/uL (ref 3.87–5.11)
RDW: 14.8 % (ref 11.5–15.5)
WBC Count: 5.5 10*3/uL (ref 4.0–10.5)
nRBC: 0 % (ref 0.0–0.2)

## 2022-04-05 LAB — PROTIME-INR
INR: 4.4 (ref 0.8–1.2)
Prothrombin Time: 41.3 seconds — ABNORMAL HIGH (ref 11.4–15.2)

## 2022-04-05 NOTE — Progress Notes (Unsigned)
Critical INR called to Rondel Baton, RN at [facility] by Prudencio Burly

## 2022-04-05 NOTE — Telephone Encounter (Signed)
CRITICAL VALUE STICKER  CRITICAL VALUE:   INR 4.4  RECEIVER (on-site recipient of call):   Bonnita Nasuti, [place] NOTIFIED: 12:09  MESSENGER (representative from lab):   Lelan Pons  MD NOTIFIED:   Kalman Shan  TIME OF NOTIFICATION:  12:11

## 2022-04-05 NOTE — Telephone Encounter (Signed)
T/C to pt to confirm her coumadin dose and she said she is taking 10 mg PO daily.  She did not double her dose and has not started any new medications.  Pt agreed to not take her dose today and resume 10 mg dose tomorrow.   Message sent to scheduling for a lab only appt on 10/13 at 8:30.  Pt advised of date and time

## 2022-04-12 ENCOUNTER — Telehealth: Payer: Self-pay | Admitting: Internal Medicine

## 2022-04-13 ENCOUNTER — Inpatient Hospital Stay: Payer: Medicaid Other

## 2022-04-13 ENCOUNTER — Other Ambulatory Visit: Payer: Self-pay

## 2022-04-18 ENCOUNTER — Other Ambulatory Visit: Payer: Self-pay

## 2022-04-18 DIAGNOSIS — Z86718 Personal history of other venous thrombosis and embolism: Secondary | ICD-10-CM

## 2022-04-18 DIAGNOSIS — I82411 Acute embolism and thrombosis of right femoral vein: Secondary | ICD-10-CM

## 2022-04-19 ENCOUNTER — Inpatient Hospital Stay: Payer: Medicaid Other

## 2022-05-02 NOTE — Progress Notes (Signed)
[facility]   [phone] Fax:([phone]) [phone]   Clinic Follow up Note   Patient Care Team: Fleet Contras, MD as PCP - General (Internal Medicine) Drema Dallas, DO as Consulting Physician (Neurology) Leonie Douglas, MD as Consulting Physician (Vascular Surgery) Malachy Mood, MD as Consulting Physician (Hematology) Danie Chandler, RN as Case Manager 05/03/2022  CHIEF COMPLAINT: Follow up recurrent DVT/PE  CURRENT THERAPY: Coumadin 10 mg po once daily  INTERVAL HISTORY: Ms. Bubak returns for follow up as scheduled, last seen by me 03/08/22 at which time she was therapeutic, however INR increased to 4.4 on 10/5 and that coumadin dose was held. She did not return for lab check 2 weeks later. She presents today feeling well. 3 months ago she noticed left lower leg slightly discolored/darker, but no new pain or swelling. She is compliant with coumadin.She had a lighter period that began last week. Denies other bleeding. She would like to get pregnant again and agrees to do lovenox. Denies headache, dizziness, new cough, chest pain, dyspnea.   All other systems were reviewed with the patient and are negative.  MEDICAL HISTORY:  Past Medical History:  Diagnosis Date   DVT (deep venous thrombosis) (HCC)    Stroke [facility])     SURGICAL HISTORY: Past Surgical History:  Procedure Laterality Date   BIOPSY  04/05/2021   Procedure: BIOPSY;  Surgeon: Jenel Lucks, MD;  Location: [facility] ENDOSCOPY;  Service: Gastroenterology;;   COLONOSCOPY N/A 04/05/2021   Procedure: COLONOSCOPY;  Surgeon: Jenel Lucks, MD;  Location: [facility] ENDOSCOPY;  Service: Gastroenterology;  Laterality: N/A;   ESOPHAGOGASTRODUODENOSCOPY N/A 04/05/2021   Procedure: ESOPHAGOGASTRODUODENOSCOPY (EGD);  Surgeon: Jenel Lucks, MD;  Location: [facility] ENDOSCOPY;  Service: Gastroenterology;  Laterality: N/A;   LOWER EXTREMITY VENOGRAPHY Right 01/22/2022   Procedure: LOWER EXTREMITY VENOGRAPHY;  Surgeon:  Leonie Douglas, MD;  Location: [facility] INVASIVE CV LAB;  Service: Cardiovascular;  Laterality: Right;   NO PAST SURGERIES     PERIPHERAL VASCULAR BALLOON ANGIOPLASTY  03/24/2021   Procedure: PERIPHERAL VASCULAR BALLOON ANGIOPLASTY;  Surgeon: Leonie Douglas, MD;  Location: [facility] INVASIVE CV LAB;  Service: Cardiovascular;;  bilateral common iliacs   PERIPHERAL VASCULAR THROMBECTOMY N/A 03/23/2021   Procedure: PERIPHERAL VASCULAR THROMBECTOMY;  Surgeon: Maeola Harman, MD;  Location: [facility] INVASIVE CV LAB;  Service: Cardiovascular;  Laterality: N/A;   PERIPHERAL VASCULAR THROMBECTOMY N/A 03/24/2021   Procedure: LYSIS RECHECK;  Surgeon: Leonie Douglas, MD;  Location: [facility] INVASIVE CV LAB;  Service: Cardiovascular;  Laterality: N/A;    I have reviewed the social history and family history with the patient and they are unchanged from previous note.  ALLERGIES:  has No Known Allergies.  MEDICATIONS:  Current Outpatient Medications  Medication Sig Dispense Refill   acetaminophen (TYLENOL) 500 MG tablet Take 1,000 mg by mouth every 6 (six) hours as needed for mild pain.     acetaZOLAMIDE (DIAMOX) 125 MG tablet Take 2 tablets (250 mg total) by mouth 2 (two) times daily. 120 tablet 5   cetirizine (ZYRTEC) 10 MG tablet Take 10 mg by mouth daily as needed for allergies.     diclofenac Sodium (VOLTAREN) 1 % GEL Apply 4 g topically 4 (four) times daily as needed for pain.     docusate sodium (COLACE) 100 MG capsule Take 1 capsule (100 mg total) by mouth at bedtime. (Patient taking differently: Take 100 mg by mouth daily as needed for mild constipation.) 10 capsule 0   metoprolol succinate (  TOPROL-XL) 25 MG 24 hr tablet Take 1 tablet (25 mg total) by mouth daily. 30 tablet 1   neomycin-bacitracin-polymyxin [phone] OINT Apply 1 Application topically 2 (two) times daily. 15 g 0   polyethylene glycol (MIRALAX / GLYCOLAX) 17 g packet Take 17 g by mouth daily. (Patient taking differently: Take 17 g by mouth  daily as needed for mild constipation.) 14 each 0   Prenatal Vit-Fe Fumarate-FA (PRENATAL VITAMIN) 27-0.8 MG TABS Take 1 tablet by mouth daily. 30 tablet 12   tiZANidine (ZANAFLEX) 2 MG tablet Take 2 mg by mouth 2 (two) times daily as needed for muscle spasms.     Vitamin D, Ergocalciferol, (DRISDOL) 1.25 MG (50000 UNIT) CAPS capsule Take 50,000 Units by mouth every 7 (seven) days. Saturday     warfarin (COUMADIN) 10 MG tablet Take 1 tablet (10 mg total) by mouth at bedtime. 30 tablet 2   No current facility-administered medications for this visit.    PHYSICAL EXAMINATION: ECOG PERFORMANCE STATUS: 0 - Asymptomatic  Vitals:   05/03/22 0925  BP: (!) 112/51  Pulse: 75  Resp: 18  Temp: 98.2 F (36.8 C)  SpO2: 99%   Filed Weights   05/03/22 0925  Weight: 145 lb 4 oz (65.9 kg)    GENERAL:alert, no distress and comfortable SKIN: no rash  EYES: sclera clear LUNGS: clear with normal breathing effort HEART: regular rate & rhythm, trace bilateral lower extremity edema with skin discoloration over the left lateral LE NEURO: alert & oriented x 3 with fluent speech, no focal motor/sensory deficits  LABORATORY DATA:  I have reviewed the data as listed    Latest Ref Rng & Units 05/03/2022    8:47 AM 04/05/2022    8:30 AM 03/08/2022    9:17 AM  CBC  WBC 4.0 - 10.5 K/uL 4.0  5.5  5.0   Hemoglobin 12.0 - 15.0 g/dL 57.8  46.9  62.9   Hematocrit 36.0 - 46.0 % 43.5  38.5  38.1   Platelets 150 - 400 K/uL 341  380  380         Latest Ref Rng & Units 01/20/2022    7:03 PM 12/20/2021    9:02 AM 11/01/2021    2:12 AM  CMP  Glucose 70 - 99 mg/dL 90  97  88   BUN 6 - 20 mg/dL 7  8  6    Creatinine 0.44 - 1.00 mg/dL 5.28  4.13  2.44   Sodium 135 - 145 mmol/L 137  136  137   Potassium 3.5 - 5.1 mmol/L 4.1  3.5  4.0   Chloride 98 - 111 mmol/L 104  104  102   CO2 22 - 32 mmol/L 27  26  27    Calcium 8.9 - 10.3 mg/dL 9.4  9.3  9.7   Total Protein 6.5 - 8.1 g/dL  7.8    Total Bilirubin 0.3 - 1.2  mg/dL  0.3    Alkaline Phos 38 - 126 U/L  107    AST 15 - 41 U/L  21    ALT 0 - 44 U/L  37        RADIOGRAPHIC STUDIES: I have personally reviewed the radiological images as listed and agreed with the findings in the report. No results found.   ASSESSMENT & PLAN: 31 year old female   1.  Recurrent thrombosis, on lifelong anticoagulation -H/o recurrent DVT in cerebral venous sinus, PE, and extensive thrombus involving IVC all the way to lower extremity -Hypercoagulable work-up  was negative in 2020 except slightly low protein S activity in the setting of acute DVT, could be falsely low so not diagnostic -No clinical or lab evidence of antiphospholipid syndrome -She had uneventful pregnancy and vaginal delivery 10/2020, on Lovenox during pregnancy and postpartum but possibly noncompliant -The exact etiology of her recurrent extensive thrombosis is unclear, we recommend lifelong anticoagulation.  -She developed acute lower extremity DVT on Xarelto, concerning for Xarelto failure.  Xarelto was stopped and she was treated with heparin in the hospital, and transition to Coumadin with INR goal 2-3 -INR had been difficult to keep therapeutic, she missed 3 doses and developed acute RLE DVT, discharged on lovenox bridge 01/22/22. -She developed hematuria and supratherapeutic INR, lovenox was stopped 8/3. I -She was seen by vascular surgeon Dr. 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 -Ms. Harting appears stable, no signs of recurrent thrombosis.  -CBC reviewed, stable, no anemia  -Her INR is supratherapeutic today, 5.7; not sure why it is so high given reported compliance.  -Given her desire to conceive and have another child, and she would like to actively start trying, coupled with her difficulty keeping her INR therapeutic, will switch her to lovenox injections when INR comes down.  -We discussed pregnancy is a hypercoagulable state, and  her risk of thrombosis risk increases even after delivery. She understands and agrees to comply with injections. -She will stop coumadin, repeat INR tomorrow. If INR >4 on 11/3, continue holding and start lovenox 11/5; If tomorrow's INR is <4, OK to start lovenox on 11/4 -will prescribe and let her ob/gyn know the plan.  -F/up in 3 months    PLAN: -INR 5.7, hold coumadin -Pt desires pregnancy, will switch coumadin to lovenox -Repeat INR on 11/3, if >4, start lovenox 11/5, if INR <4, start lovenox 11/4 -cc note to Ob/gyn -F/up in 3 months  -Plan discussed with Dr. Mosetta Putt     All questions were answered. The patient knows to call the clinic with any problems, questions or concerns. No barriers to learning was detected. I spent 20 minutes counseling the patient face to face. The total time spent in the appointment was 30 minutes and more than 50% was on counseling and review of test results and coordination of care.      Pollyann Samples, NP 05/03/22

## 2022-05-03 ENCOUNTER — Inpatient Hospital Stay: Payer: Medicaid Other | Attending: Nurse Practitioner | Admitting: Nurse Practitioner

## 2022-05-03 ENCOUNTER — Telehealth: Payer: Self-pay

## 2022-05-03 ENCOUNTER — Encounter: Payer: Self-pay | Admitting: Nurse Practitioner

## 2022-05-03 ENCOUNTER — Inpatient Hospital Stay: Payer: Medicaid Other

## 2022-05-03 VITALS — BP 112/51 | HR 75 | Temp 98.2°F | Resp 18 | Wt 145.2 lb

## 2022-05-03 DIAGNOSIS — I82411 Acute embolism and thrombosis of right femoral vein: Secondary | ICD-10-CM | POA: Diagnosis not present

## 2022-05-03 DIAGNOSIS — I82220 Acute embolism and thrombosis of inferior vena cava: Secondary | ICD-10-CM

## 2022-05-03 DIAGNOSIS — Z7901 Long term (current) use of anticoagulants: Secondary | ICD-10-CM | POA: Insufficient documentation

## 2022-05-03 DIAGNOSIS — Z86718 Personal history of other venous thrombosis and embolism: Secondary | ICD-10-CM

## 2022-05-03 DIAGNOSIS — I82501 Chronic embolism and thrombosis of unspecified deep veins of right lower extremity: Secondary | ICD-10-CM | POA: Diagnosis present

## 2022-05-03 LAB — CBC WITH DIFFERENTIAL (CANCER CENTER ONLY)
Abs Immature Granulocytes: 0.01 10*3/uL (ref 0.00–0.07)
Basophils Absolute: 0.1 10*3/uL (ref 0.0–0.1)
Basophils Relative: 2 %
Eosinophils Absolute: 0.1 10*3/uL (ref 0.0–0.5)
Eosinophils Relative: 2 %
HCT: 43.5 % (ref 36.0–46.0)
Hemoglobin: 13.2 g/dL (ref 12.0–15.0)
Immature Granulocytes: 0 %
Lymphocytes Relative: 39 %
Lymphs Abs: 1.6 10*3/uL (ref 0.7–4.0)
MCH: 24.7 pg — ABNORMAL LOW (ref 26.0–34.0)
MCHC: 30.3 g/dL (ref 30.0–36.0)
MCV: 81.5 fL (ref 80.0–100.0)
Monocytes Absolute: 0.6 10*3/uL (ref 0.1–1.0)
Monocytes Relative: 14 %
Neutro Abs: 1.7 10*3/uL (ref 1.7–7.7)
Neutrophils Relative %: 43 %
Platelet Count: 341 10*3/uL (ref 150–400)
RBC: 5.34 MIL/uL — ABNORMAL HIGH (ref 3.87–5.11)
RDW: 15.4 % (ref 11.5–15.5)
WBC Count: 4 10*3/uL (ref 4.0–10.5)
nRBC: 0 % (ref 0.0–0.2)

## 2022-05-03 LAB — PROTIME-INR
INR: 5.7 (ref 0.8–1.2)
Prothrombin Time: 50.7 seconds — ABNORMAL HIGH (ref 11.4–15.2)

## 2022-05-03 MED ORDER — ENOXAPARIN SODIUM 100 MG/ML IJ SOSY: 1.5000 mg/kg | PREFILLED_SYRINGE | INTRAMUSCULAR | 0 refills | Status: DC

## 2022-05-03 NOTE — Telephone Encounter (Signed)
CRITICAL VALUE STICKER  CRITICAL VALUE:    INR  5.7  RECEIVER (on-site recipient of call):   Bonnita Nasuti, [place] NOTIFIED: 10:32  MESSENGER (representative from lab):  Hillary  MD NOTIFIED: Cira Rue, NP  TIME OF NOTIFICATION:  11:33

## 2022-05-03 NOTE — Progress Notes (Unsigned)
INR 5.7 called to [facility] at 1030 by [facility] 05/03/22

## 2022-05-04 ENCOUNTER — Telehealth: Payer: Self-pay

## 2022-05-04 ENCOUNTER — Inpatient Hospital Stay: Payer: Medicaid Other

## 2022-05-04 ENCOUNTER — Other Ambulatory Visit: Payer: Self-pay

## 2022-05-04 DIAGNOSIS — I82501 Chronic embolism and thrombosis of unspecified deep veins of right lower extremity: Secondary | ICD-10-CM | POA: Diagnosis not present

## 2022-05-04 DIAGNOSIS — I82411 Acute embolism and thrombosis of right femoral vein: Secondary | ICD-10-CM

## 2022-05-04 DIAGNOSIS — Z86718 Personal history of other venous thrombosis and embolism: Secondary | ICD-10-CM

## 2022-05-04 LAB — PROTIME-INR
INR: 3.3 — ABNORMAL HIGH (ref 0.8–1.2)
Prothrombin Time: 33.2 seconds — ABNORMAL HIGH (ref 11.4–15.2)

## 2022-05-04 NOTE — Telephone Encounter (Signed)
Cira Rue NP office note from 05/03/22 faxed to [name] MD/ PCP.

## 2022-05-04 NOTE — Telephone Encounter (Signed)
Per instructions from Cira Rue NP, called patient to instruct that she begin her Lovenox injections tonight. Patient voiced understanding.

## 2022-05-09 ENCOUNTER — Telehealth: Payer: Self-pay

## 2022-05-09 NOTE — Telephone Encounter (Signed)
Spoke with patient. Patient is taking Lovenox injections with no complaints.

## 2022-05-14 ENCOUNTER — Encounter: Payer: Self-pay | Admitting: Nurse Practitioner

## 2022-05-15 ENCOUNTER — Encounter: Payer: Self-pay | Admitting: Obstetrics and Gynecology

## 2022-05-15 ENCOUNTER — Other Ambulatory Visit: Payer: Medicaid Other | Admitting: Obstetrics and Gynecology

## 2022-05-15 NOTE — Patient Instructions (Signed)
Hi Andrea Hayes, nice to speak with you today-have a wonderful day and don't forget to call your provider today, thank you!!  Andrea Hayes was given information about Medicaid Managed Care team care coordination services as a part of their [facility] Medicaid benefit. Andrea Hayes verbally consented to engagement with the [facility] Managed Care team.   If you are experiencing a medical emergency, please call 911 or report to your local emergency department or urgent care.   If you have a non-emergency medical problem during routine business hours, please contact your provider's office and ask to speak with a nurse.   For questions related to your [facility] health plan, please call: [phone] or go here:[email or web address]  If you would like to schedule transportation through your [facility] plan, please call the following number at least 2 days in advance of your appointment: [phone].  You can also use the MTM portal or MTM mobile app to manage your rides. For the portal, please go to [email or web address].  Call the [place] at [phone], at any time, 24 hours a day, 7 days a week. If you are in danger or need immediate medical attention call 911.  If you would like help to quit smoking, call 1-800-QUIT-NOW 579 066 0774) OR Espaol: 1-855-Djelo-Ya (0-165-537-4827) o para ms informacin haga clic aqu or Text READY to 200-400 to register via text  Andrea Hayes - following are the goals we discussed in your visit today:   Goals Addressed    Timeframe:  Long-Range Goal Priority:  High Start Date:    03/09/22                         Expected End Date:     ongoing                  Follow Up Date  06/15/22   - practice safe sex - schedule appointment for flu shot - schedule appointment for vaccines needed due to my age or health - schedule recommended health tests (blood work, mammogram, colonoscopy, pap test) - schedule and keep appointment for  annual check-up    Why is this important?   Screening tests can find diseases early when they are easier to treat.  Your doctor or nurse will talk with you about which tests are important for you.  Getting shots for common diseases like the flu and shingles will help prevent them.   05/15/22-Patient recently seen and evaluated by hematology  Patient verbalizes understanding of instructions and care plan provided today and agrees to view in [place]. Active MyChart status and patient understanding of how to access instructions and care plan via MyChart confirmed with patient.     The Managed Medicaid care management team will reach out to the patient again over the next 30 business  days.  The  Patient  has been provided with contact information for the Managed Medicaid care management team and has been advised to call with any health related questions or concerns.   Aida Raider RN, BSN [place] Management Coordinator - Managed Medicaid High Risk [phone]   Following is a copy of your plan of care:  Care Plan : [place] of Care  Updates made by Gayla Medicus, RN since 05/15/2022 12:00 AM     Problem: Chronic Disease Management and Care Coordination Needs for Deep Vein Thrombosis/stroke management and HTN   Priority: High  Onset Date: 06/06/2021  Long-Range Goal: Development of Plan of Care for Management and Care Coordination Needs (DVT/stroke management and HTN)   Start Date: 06/06/2021  Expected End Date: 08/15/2022  Recent Progress: On track  Priority: High  Note:   Current Barriers:  Knowledge Deficits related to plan of care for management of HTN and deep vein thrombosis, stroke  Care Coordination needs related to Limited education about deep vein thrombosis, anticoagulants, strokes* Chronic Disease Management support and education needs related to HTN and deep vein thrombosis, stroke.  05/15/22:  Patient with no swelling  today-has been started on Lovenox and complains of "bump" at site of injection site and painful-rates as a "5" on scale of 1-10.   Patient to call provider today.    Patient in need of dental resources-will refer.  RN CM Clinical Goal(s):  Patient will verbalize understanding of plan for management of HTN and recurrent Deep Vein Thrombosis (DVT), stroke as evidenced by good control of HTN and recurrent DVT verbalize basic understanding of  HTN and deep vein thrombosis, stroke disease process and self health management plan as evidenced by for deep vein thrombosis, stroke to include use of anticoagulants and diet and hypertension take all medications exactly as prescribed and will call provider for medication related questions as evidenced by medication compliance. attend all scheduled medical appointments: weekly labs for INR due to daily coumadin;  as evidenced by attending all scheduled appointments demonstrate Improved and Ongoing adherence to prescribed treatment plan for HTN and deep vein thrombosis, stroke as evidenced by therapeutic INR values continue to work with RN Care Manager to address care management and care coordination needs related to  HTN and recurrent DVT, stroke prevention as evidenced by adherence to CM Team Scheduled appointments through collaboration with RN Care manager, provider, and care team.   Interventions: Follow up outreaches to review labs, review upcoming appointments, to educate and answer questions Inter-disciplinary care team collaboration (see longitudinal plan of care) Evaluation of current treatment plan related to  self management and patient's adherence to plan as established by provider Follow up with patient to assess for any worsening symptoms related to DVT or care coordination needs Reviewed recent labs, recent blood pressure readings and discussed importance of hydration and it's affect on blood pressure. No reported leg pain, no headaches Reviewed  upcoming appointments with patient. Collaborated with BSW BSW referral for dental resources.  Deep vein thrombosis, stroke Goal on track:  Yes. Evaluation of current treatment plan related to  deep vein thrombosis, stroke , Limited education about home management, medicine, diet related to deep vein thrombosis, stroke* self-management and patient's adherence to plan as established by provider. Discussed plans with patient for ongoing care management follow up and provided patient with direct contact information for care management team Evaluation of current treatment plan related to deep vein thrombosis, stroke and patient's adherence to plan as established by provider Provided education to patient re: DVT, Stroke, anticoagulants, bleeding risks, INR values with therapeutic range of 2.5-3.5 Reviewed medications with patient and discussed action plan, worsening symptoms to call MD office for any changes Reviewed scheduled/upcoming provider appointments  Discussed plans with patient for ongoing care management follow up and provided patient with direct contact information for care management team Provided continued education on signs & symptoms of DVT: swelling, redness and warmth/heat at site.  Hypertension Interventions:  (Status:  Goal on track:  Yes.) Long Term Goal Last practice recorded BP readings:  BP Readings from Last 3 Encounters:  11/01/21 112/60  09/27/21 109/76  08/02/21 103/64    Most recent eGFR/CrCl: No results found for: EGFR  No components found for: CRCL  Evaluation of current treatment plan related to hypertension self management and patient's adherence to plan as established by provider Reviewed medications with patient and discussed importance of compliance Discussed plans with patient for ongoing care management follow up and provided patient with direct contact information for care management team Reviewed scheduled/upcoming provider appointments   Reinforced  education with patient on the importance of staying hydrated and it's direct affect on blood pressure.  Patient states she drinks "A Lot" of fluids throughout the day.  Patient denies any light headed or dizziness.   Patient verbalized understanding.  Patient Goals/Self-Care Activities: Take all medications as prescribed Attend all scheduled provider appointments Perform all self care activities independently  Perform IADL's (shopping, preparing meals, housekeeping, managing finances) independently Call provider office for new concerns or questions   Follow Up Plan:  The patient has been provided contact information via My Chart link for the care management team and has been advised to call with any health related questions or concerns.

## 2022-05-15 NOTE — Patient Outreach (Signed)
Medicaid Managed Care   Nurse Care Manager Note  05/15/2022 Name:  Andrea Hayes MRN:  [id number] DOB:  1990/10/28  Andrea Hayes is an 31 y.o. year old female who is a primary patient of Andrea Ebbs, MD.  The Medicaid Managed Care Coordination team was consulted for assistance with:    Chronic healthcare management needs, HTN, h/o recurrent DVTs  Andrea Hayes was given information about Medicaid Managed Care Coordination team services today. Andrea Hayes Patient agreed to services and verbal consent obtained.  Engaged with patient by telephone for follow up visit in response to provider referral for case management and/or care coordination services.   Assessments/Interventions:  Review of past medical history, allergies, medications, health status, including review of consultants reports, laboratory and other test data, was performed as part of comprehensive evaluation and provision of chronic care management services.  SDOH (Social Determinants of Health) assessments and interventions performed: SDOH Interventions    Flowsheet Row Patient Outreach Telephone from 05/15/2022 in [place] Patient Outreach Telephone from 03/09/2022 in [place] Patient Outreach Telephone from 06/06/2021 in [place] Patient Outreach Telephone from 06/02/2021 in [place] Coordination  SDOH Interventions      Food Insecurity Interventions -- -- Intervention Not Indicated Intervention Not Indicated  Housing Interventions -- -- Intervention Not Indicated Intervention Not Indicated  Transportation Interventions -- -- Intervention Not Indicated Intervention Not Indicated  Utilities Interventions -- Intervention Not Indicated -- --  Alcohol Usage Interventions Intervention Not Indicated (Score <7) -- -- --  Financial Strain Interventions -- -- Intervention Not Indicated  Intervention Not Indicated  Physical Activity Interventions -- -- Intervention Not Indicated  [Patient works full time 1PM to 9PM and has 90 month old infant.  Currently patient does not have time to exercise.] --  Stress Interventions -- -- Intervention Not Indicated Intervention Not Indicated  [has newborn]  Social Connections Interventions -- -- Intervention Not Indicated Intervention Not Indicated     Care Plan  No Known Allergies  Medications Reviewed Today     Reviewed by Andrea Medicus, RN (Registered Nurse) on 05/15/22 at 37  Med List Status: <None>   Medication Order Taking? Sig Documenting Provider Last Dose Status Informant  acetaminophen (TYLENOL) 500 MG tablet [id number] No Take 1,000 mg by mouth every 6 (six) hours as needed for mild pain. [provider] Taking Active Self  acetaZOLAMIDE (DIAMOX) 125 MG tablet [id number] No Take 2 tablets (250 mg total) by mouth 2 (two) times daily. [name], DO Taking Active Self  cetirizine (ZYRTEC) 10 MG tablet [id number] No Take 10 mg by mouth daily as needed for allergies. [provider] Taking Active Self  diclofenac Sodium (VOLTAREN) 1 % GEL [id number] No Apply 4 g topically 4 (four) times daily as needed for pain. [provider] Taking Active Self  docusate sodium (COLACE) 100 MG capsule [id number] No Take 1 capsule (100 mg total) by mouth at bedtime.  Patient taking differently: Take 100 mg by mouth daily as needed for mild constipation.   Andrea Hamman, MD Taking Active Self  enoxaparin (LOVENOX) 100 MG/ML injection [id number]  Inject 1 mL (100 mg total) into the skin daily for 30 doses. [name], NP  Active   metoprolol succinate (TOPROL-XL) 25 MG 24 hr tablet [id number] No Take 1 tablet (25 mg total) by mouth daily. [name], MD Taking Active Self  neomycin-bacitracin-polymyxin [phone] OINT [id number]  Apply 1 Application topically 2 (two) times daily. [name], NP  Active    polyethylene glycol (MIRALAX / GLYCOLAX) 17 g packet [id number] No Take 17 g by mouth daily.  Patient taking differently: Take 17 g by mouth daily as needed for mild constipation.   Andrea Hamman, MD Taking Active Self  Prenatal Vit-Fe Fumarate-FA (PRENATAL VITAMIN) 27-0.8 MG TABS [id number] No Take 1 tablet by mouth daily. Andrea Hayes, CNM Taking Active Self  tiZANidine (ZANAFLEX) 2 MG tablet [id number] No Take 2 mg by mouth 2 (two) times daily as needed for muscle spasms. [provider] Taking Active Self  Vitamin D, Ergocalciferol, (DRISDOL) 1.25 MG (50000 UNIT) CAPS capsule [id number] No Take 50,000 Units by mouth every 7 (seven) days. 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   Acute respiratory failure with hypoxia ([place]) 01/26/2021   Chronic right arterial ischemic stroke, MCA (middle cerebral artery) 01/23/2021   Pulmonary embolism ([place]) 01/23/2021   Leukocytosis 01/23/2021   Thrombocytopenia ([place]) 01/23/2021   Elevated liver enzymes 01/23/2021   Chest pain 01/23/2021   Back pain 01/23/2021   Vaginal delivery 12/05/2020   Headache 11/28/2020   HTN (hypertension) 11/28/2020   Severe preeclampsia, delivered 11/04/2020   IUGR (intrauterine growth restriction) affecting care of mother 10/31/2020   Thrombocytopenia affecting pregnancy ([place]) 05/27/2020   Acute right arterial ischemic stroke, middle cerebral artery (MCA) ([facility])    Dural venous sinus thrombosis    Idiopathic intracranial hypertension    Cerebral venous thrombosis 07/24/2018   Conditions to be  addressed/monitored per PCP order:  Chronic healthcare management needs, HTN, h/o recurrent DVTs, h/o CVA  Care Plan : RN Care Manager Plan of Care  Updates made by Andrea Medicus, RN since 05/15/2022 12:00 AM     Problem: Chronic Disease Management and Care Coordination Needs for Deep Vein Thrombosis/stroke management and HTN   Priority: High  Onset Date: 06/06/2021     Long-Range Goal: Development of Plan of Care for Management and Care Coordination Needs (DVT/stroke management and HTN)   Start Date: 06/06/2021  Expected End Date: 08/15/2022  Recent Progress: On track  Priority: High  Note:   Current Barriers:  Knowledge Deficits related to plan of care for management of HTN and deep vein thrombosis, stroke  Care Coordination needs related to Limited education about deep vein thrombosis, anticoagulants, strokes* Chronic Disease Management support and education needs related to HTN and deep vein thrombosis, stroke.  05/15/22:  Patient with no swelling today-has been started on Lovenox and complains of "bump" at site of injection site and painful-rates as a "5" on scale of 1-10.   Patient to call provider today.    Patient in need of dental resources-will refer.  RN CM Clinical Goal(s):  Patient will verbalize understanding of plan for management of HTN and recurrent Deep Vein Thrombosis (DVT), stroke as evidenced by good control of HTN and recurrent DVT verbalize basic understanding of  HTN and deep vein thrombosis, stroke disease process and self health management plan as evidenced by for  deep vein thrombosis, stroke to include use of anticoagulants and diet and hypertension take all medications exactly as prescribed and will call provider for medication related questions as evidenced by medication compliance. attend all scheduled medical appointments: weekly labs for INR due to daily coumadin;  as evidenced by attending all scheduled appointments demonstrate Improved and Ongoing adherence  to prescribed treatment plan for HTN and deep vein thrombosis, stroke as evidenced by therapeutic INR values continue to work with RN Care Manager to address care management and care coordination needs related to  HTN and recurrent DVT, stroke prevention as evidenced by adherence to CM Team Scheduled appointments through collaboration with RN Care manager, provider, and care team.   Interventions: Follow up outreaches to review labs, review upcoming appointments, to educate and answer questions Inter-disciplinary care team collaboration (see longitudinal plan of care) Evaluation of current treatment plan related to  self management and patient's adherence to plan as established by provider Follow up with patient to assess for any worsening symptoms related to DVT or care coordination needs Reviewed recent labs, recent blood pressure readings and discussed importance of hydration and it's affect on blood pressure. No reported leg pain, no headaches Reviewed upcoming appointments with patient. Collaborated with BSW BSW referral for dental resources.  Deep vein thrombosis, stroke Goal on track:  Yes. Evaluation of current treatment plan related to  deep vein thrombosis, stroke , Limited education about home management, medicine, diet related to deep vein thrombosis, stroke* self-management and patient's adherence to plan as established by provider. Discussed plans with patient for ongoing care management follow up and provided patient with direct contact information for care management team Evaluation of current treatment plan related to deep vein thrombosis, stroke and patient's adherence to plan as established by provider Provided education to patient re: DVT, Stroke, anticoagulants, bleeding risks, INR values with therapeutic range of 2.5-3.5 Reviewed medications with patient and discussed action plan, worsening symptoms to call MD office for any changes Reviewed scheduled/upcoming provider  appointments  Discussed plans with patient for ongoing care management follow up and provided patient with direct contact information for care management team Provided continued education on signs & symptoms of DVT: swelling, redness and warmth/heat at site.  Hypertension Interventions:  (Status:  Goal on track:  Yes.) Long Term Goal Last practice recorded BP readings:  BP Readings from Last 3 Encounters:  11/01/21 112/60  09/27/21 109/76  08/02/21 103/64    Most recent eGFR/CrCl: No results found for: EGFR  No components found for: CRCL  Evaluation of current treatment plan related to hypertension self management and patient's adherence to plan as established by provider Reviewed medications with patient and discussed importance of compliance Discussed plans with patient for ongoing care management follow up and provided patient with direct contact information for care management team Reviewed scheduled/upcoming provider appointments   Reinforced education with patient on the importance of staying hydrated and it's direct affect on blood pressure.  Patient states she drinks "A Lot" of fluids throughout the day.  Patient denies any light headed or dizziness.   Patient verbalized understanding.  Patient Goals/Self-Care Activities: Take all medications as prescribed Attend all scheduled provider appointments Perform all self care activities independently  Perform IADL's (shopping, preparing meals, housekeeping, managing finances) independently Call provider office for new concerns or questions   Follow Up Plan:  The patient has been provided contact information via My Chart link for the care management team and has been advised to call with any health  related questions or concerns.    Long-Range Goal: Establish Plan of Care for Chronic Disease Management Needs   Priority: High  Note:   Timeframe:  Long-Range Goal Priority:  High Start Date:    03/09/22                         Expected  End Date:     ongoing                  Follow Up Date  06/15/22   - practice safe sex - schedule appointment for flu shot - schedule appointment for vaccines needed due to my age or health - schedule recommended health tests (blood work, mammogram, colonoscopy, pap test) - schedule and keep appointment for annual check-up    Why is this important?   Screening tests can find diseases early when they are easier to treat.  Your doctor or nurse will talk with you about which tests are important for you.  Getting shots for common diseases like the flu and shingles will help prevent them.   05/15/22-Patient recently seen and evaluated by hematology   Follow Up:  Patient agrees to Care Plan and Follow-up.  Plan: The Managed Medicaid care management team will reach out to the patient again over the next 30 business  days. and The  Patient has been provided with contact information for the Managed Medicaid care management team and has been advised to call with any health related questions or concerns.  Date/time of next scheduled RN care management/care coordination outreach:  06/15/22 at 0900

## 2022-05-16 ENCOUNTER — Other Ambulatory Visit: Payer: Medicaid Other

## 2022-05-16 NOTE — Patient Outreach (Signed)
Medicaid Managed Care Social Work Note  05/16/2022 Name:  Andrea Hayes MRN:  [id number] DOB:  Apr 19, 1991  Andrea Hayes is an 31 y.o. year old female who is a primary patient of Fleet Contras, MD.  The Medicaid Managed Care Coordination team was consulted for assistance with:   dental resources  Andrea Hayes was given information about Medicaid Managed Care Coordination team services today. Homer Ehrman Patient agreed to services and verbal consent obtained.  Engaged with patient  for by telephone forinitial visit in response to referral for case management and/or care coordination services.   Assessments/Interventions:  Review of past medical history, allergies, medications, health status, including review of consultants reports, laboratory and other test data, was performed as part of comprehensive evaluation and provision of chronic care management services.  SDOH: (Social Determinant of Health) assessments and interventions performed: SDOH Interventions    Flowsheet Row Patient Outreach Telephone from 05/15/2022 in [place] POP[facility] DEPARTMENT Patient Outreach Telephone from 03/09/2022 in [facility] Care Coordination Patient Outreach Telephone from 06/06/2021 in [facility] Coordination Patient Outreach Telephone from 06/02/2021 in [facility] Coordination  SDOH Interventions      Food Insecurity Interventions -- -- Intervention Not Indicated Intervention Not Indicated  Housing Interventions -- -- Intervention Not Indicated Intervention Not Indicated  Transportation Interventions -- -- Intervention Not Indicated Intervention Not Indicated  Utilities Interventions -- Intervention Not Indicated -- --  Alcohol Usage Interventions Intervention Not Indicated (Score <7) -- -- --  Financial Strain Interventions -- -- Intervention Not Indicated Intervention Not Indicated  Physical Activity Interventions --  -- Intervention Not Indicated  [Patient works full time 1PM to 9PM and has 42 month old infant.  Currently patient does not have time to exercise.] --  Stress Interventions -- -- Intervention Not Indicated Intervention Not Indicated  [has newborn]  Social Connections Interventions -- -- Intervention Not Indicated Intervention Not Indicated     BSW completed a telephone outreach with patient for dental resources. Patient states dental resources are needed. SDOH completed with patient and she states she would like utility resources. Patient currently receives foodsstamps and does not have any issues with rent or transportation at this time. Patient requested resources be emailed to her at [email or web address]. Resources were emailed.   Advanced Directives Status:  Not addressed in this encounter.  Care Plan                 No Known Allergies  Medications Reviewed Today     Reviewed by Danie Chandler, RN (Registered Nurse) on 05/15/22 at 1003  Med List Status: <None>   Medication Order Taking? Sig Documenting Provider Last Dose Status Informant  acetaminophen (TYLENOL) 500 MG tablet [id number] No Take 1,000 mg by mouth every 6 (six) hours as needed for mild pain. [provider] Taking Active Self  acetaZOLAMIDE (DIAMOX) 125 MG tablet [id number] No Take 2 tablets (250 mg total) by mouth 2 (two) times daily. Drema Dallas, DO Taking Active Self  cetirizine (ZYRTEC) 10 MG tablet [id number] No Take 10 mg by mouth daily as needed for allergies. [provider] Taking Active Self  diclofenac Sodium (VOLTAREN) 1 % GEL [id number] No Apply 4 g topically 4 (four) times daily as needed for pain. [provider] Taking Active Self  docusate sodium (COLACE) 100 MG capsule [id number] No Take 1 capsule (100 mg total) by mouth at bedtime.  Patient taking differently: Take 100 mg by mouth daily as  needed for mild constipation.   Rolly Salter, MD Taking Active Self  enoxaparin  (LOVENOX) 100 MG/ML injection [id number]  Inject 1 mL (100 mg total) into the skin daily for 30 doses. Pollyann Samples, NP  Active   metoprolol succinate (TOPROL-XL) 25 MG 24 hr tablet [id number] No Take 1 tablet (25 mg total) by mouth daily. Arnetha Courser, MD Taking Active Self  neomycin-bacitracin-polymyxin [phone] OINT [id number]  Apply 1 Application topically 2 (two) times daily. Pollyann Samples, NP  Active   polyethylene glycol (MIRALAX / GLYCOLAX) 17 g packet [id number] No Take 17 g by mouth daily.  Patient taking differently: Take 17 g by mouth daily as needed for mild constipation.   Rolly Salter, MD Taking Active Self  Prenatal Vit-Fe Fumarate-FA (PRENATAL VITAMIN) 27-0.8 MG TABS [id number] No Take 1 tablet by mouth daily. Aviva Signs, CNM Taking Active Self  tiZANidine (ZANAFLEX) 2 MG tablet [id number] No Take 2 mg by mouth 2 (two) times daily as needed for muscle spasms. [provider] Taking Active Self  Vitamin D, Ergocalciferol, (DRISDOL) 1.25 MG (50000 UNIT) CAPS capsule [id number] No Take 50,000 Units by mouth every 7 (seven) days. 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   Acute respiratory failure with hypoxia ([facility]) 01/26/2021   Chronic right arterial ischemic stroke, MCA (middle cerebral artery) 01/23/2021   Pulmonary embolism ([facility]) 01/23/2021   Leukocytosis 01/23/2021   Thrombocytopenia ([facility]) 01/23/2021   Elevated liver enzymes 01/23/2021   Chest pain 01/23/2021   Back pain 01/23/2021   Vaginal delivery 12/05/2020   Headache 11/28/2020   HTN (hypertension)  11/28/2020   Severe preeclampsia, delivered 11/04/2020   IUGR (intrauterine growth restriction) affecting care of mother 10/31/2020   Thrombocytopenia affecting pregnancy ([facility]) 05/27/2020   Acute right arterial ischemic stroke, middle cerebral artery (MCA) ([facility])    Dural venous sinus thrombosis    Idiopathic intracranial hypertension    Cerebral venous thrombosis 07/24/2018    Conditions to be addressed/monitored per PCP order:   dental and utility resources  There are no care plans that you recently modified to display for this patient.   Follow up:  Patient agrees to Care Plan and Follow-up.  Plan: The Managed Medicaid care management team will reach out to the patient again over the next 30 days.  Date/time of next scheduled Social Work care management/care coordination outreach:  06/15/22  Gus Puma, Kenard Gower, [facility]  [facility]  High Risk Managed Medicaid Team  [phone]

## 2022-05-16 NOTE — Patient Instructions (Signed)
Visit Information  Andrea Hayes was given information about Medicaid Managed Care team care coordination services as a part of their [facility] Medicaid benefit. Andrea Hayes verbally consented to engagement with the [facility] Managed Care team.   If you are experiencing a medical emergency, please call 911 or report to your local emergency department or urgent care.   If you have a non-emergency medical problem during routine business hours, please contact your provider's office and ask to speak with a nurse.   For questions related to your [facility] health plan, please call: [phone] or go here:[email or web address]  If you would like to schedule transportation through your [facility] plan, please call the following number at least 2 days in advance of your appointment: [phone].  You can also use the MTM portal or MTM mobile app to manage your rides. For the portal, please go to [email or web address].  Call the [place] at [phone], at any time, 24 hours a day, 7 days a week. If you are in danger or need immediate medical attention call 911.  If you would like help to quit smoking, call 1-800-QUIT-NOW 314-560-7809) OR Espaol: 1-855-Djelo-Ya (4-008-676-1950) o para ms informacin haga clic aqu or Text READY to 200-400 to register via text  Andrea Hayes - following are the goals we discussed in your visit today:   Goals Addressed   None       Social Worker will follow up on 06/15/22.   Andrea Hayes, BSW, [place]  High Risk Managed Medicaid Team  [phone]   Following is a copy of your plan of care:  Care Plan : [place] of Care  Updates made by Ethelda Chick since 05/16/2022 12:00 AM     Problem: Chronic Disease Management and Care Coordination Needs for Deep Vein Thrombosis/stroke management and HTN   Priority: High  Onset Date: 06/06/2021     Long-Range Goal:  Development of Plan of Care for Management and Care Coordination Needs (DVT/stroke management and HTN)   Start Date: 06/06/2021  Expected End Date: 08/15/2022  Recent Progress: On track  Priority: High  Note:   Current Barriers:  Knowledge Deficits related to plan of care for management of HTN and deep vein thrombosis, stroke  Care Coordination needs related to Limited education about deep vein thrombosis, anticoagulants, strokes* Chronic Disease Management support and education needs related to HTN and deep vein thrombosis, stroke.  05/15/22:  Patient with no swelling today-has been started on Lovenox and complains of "bump" at site of injection site and painful-rates as a "5" on scale of 1-10.   Patient to call provider today.    Patient in need of dental resources-will refer.  RN CM Clinical Goal(s):  Patient will verbalize understanding of plan for management of HTN and recurrent Deep Vein Thrombosis (DVT), stroke as evidenced by good control of HTN and recurrent DVT verbalize basic understanding of  HTN and deep vein thrombosis, stroke disease process and self health management plan as evidenced by for deep vein thrombosis, stroke to include use of anticoagulants and diet and hypertension take all medications exactly as prescribed and will call provider for medication related questions as evidenced by medication compliance. attend all scheduled medical appointments: weekly labs for INR due to daily coumadin;  as evidenced by attending all scheduled appointments demonstrate Improved and Ongoing adherence to prescribed treatment plan for HTN and deep vein thrombosis, stroke as evidenced by therapeutic INR values continue to  work with Consulting civil engineer to address care management and care coordination needs related to  HTN and recurrent DVT, stroke prevention as evidenced by adherence to CM Team Scheduled appointments through collaboration with RN Care manager, provider, and care team.    Interventions: Follow up outreaches to review labs, review upcoming appointments, to educate and answer questions Inter-disciplinary care team collaboration (see longitudinal plan of care) Evaluation of current treatment plan related to  self management and patient's adherence to plan as established by provider Follow up with patient to assess for any worsening symptoms related to DVT or care coordination needs Reviewed recent labs, recent blood pressure readings and discussed importance of hydration and it's affect on blood pressure. No reported leg pain, no headaches Reviewed upcoming appointments with patient. Collaborated with BSW BSW referral for dental resources. BSW completed a telephone outreach with patient for dental resources. Patient states dental resources are needed. SDOH completed with patient and she states she would like utility resources. Patient currently receives foodsstamps and does not have any issues with rent or transportation at this time. Patient requested resources be emailed to her at [email or web address]. Resources were emailed.    Deep vein thrombosis, stroke Goal on track:  Yes. Evaluation of current treatment plan related to  deep vein thrombosis, stroke , Limited education about home management, medicine, diet related to deep vein thrombosis, stroke* self-management and patient's adherence to plan as established by provider. Discussed plans with patient for ongoing care management follow up and provided patient with direct contact information for care management team Evaluation of current treatment plan related to deep vein thrombosis, stroke and patient's adherence to plan as established by provider Provided education to patient re: DVT, Stroke, anticoagulants, bleeding risks, INR values with therapeutic range of 2.5-3.5 Reviewed medications with patient and discussed action plan, worsening symptoms to call MD office for any changes Reviewed  scheduled/upcoming provider appointments  Discussed plans with patient for ongoing care management follow up and provided patient with direct contact information for care management team Provided continued education on signs & symptoms of DVT: swelling, redness and warmth/heat at site.  Hypertension Interventions:  (Status:  Goal on track:  Yes.) Long Term Goal Last practice recorded BP readings:  BP Readings from Last 3 Encounters:  11/01/21 112/60  09/27/21 109/76  08/02/21 103/64    Most recent eGFR/CrCl: No results found for: EGFR  No components found for: CRCL  Evaluation of current treatment plan related to hypertension self management and patient's adherence to plan as established by provider Reviewed medications with patient and discussed importance of compliance Discussed plans with patient for ongoing care management follow up and provided patient with direct contact information for care management team Reviewed scheduled/upcoming provider appointments   Reinforced education with patient on the importance of staying hydrated and it's direct affect on blood pressure.  Patient states she drinks "A Lot" of fluids throughout the day.  Patient denies any light headed or dizziness.   Patient verbalized understanding.  Patient Goals/Self-Care Activities: Take all medications as prescribed Attend all scheduled provider appointments Perform all self care activities independently  Perform IADL's (shopping, preparing meals, housekeeping, managing finances) independently Call provider office for new concerns or questions   Follow Up Plan:  The patient has been provided contact information via My Chart link for the care management team and has been advised to call with any health related questions or concerns.

## 2022-06-15 ENCOUNTER — Other Ambulatory Visit: Payer: Medicaid Other

## 2022-06-15 ENCOUNTER — Other Ambulatory Visit: Payer: Medicaid Other | Admitting: Obstetrics and Gynecology

## 2022-06-15 NOTE — Patient Outreach (Signed)
Medicaid Managed Care Social Work Note  06/15/2022 Name:  Andrea Hayes MRN:  [id number] DOB:  1991/06/21  Andrea Hayes is an 31 y.o. year old female who is a primary patient of Andrea Contras, MD.  The Medicaid Managed Care Coordination team was consulted for assistance with:  [facility]  and Dental resources  [name] was given information about Medicaid Managed Care Coordination team services today. [name] Patient agreed to services and verbal consent obtained.  Engaged with patient  for by telephone forfollow up visit in response to referral for case management and/or care coordination services.   Assessments/Interventions:  Review of past medical history, allergies, medications, health status, including review of consultants reports, laboratory and other test data, was performed as part of comprehensive evaluation and provision of chronic care management services.  SDOH: (Social Determinant of Health) assessments and interventions performed: SDOH Interventions    Flowsheet Row Patient Outreach Telephone from 05/15/2022 in [place] POPULATION HEALTH DEPARTMENT Patient Outreach Telephone from 03/09/2022 in [facility] Care Coordination Patient Outreach Telephone from 06/06/2021 in [facility] Care Coordination Patient Outreach Telephone from 06/02/2021 in [facility] Care Coordination  SDOH Interventions      Food Insecurity Interventions -- -- Intervention Not Indicated Intervention Not Indicated  Housing Interventions -- -- Intervention Not Indicated Intervention Not Indicated  Transportation Interventions -- -- Intervention Not Indicated Intervention Not Indicated  Utilities Interventions -- Intervention Not Indicated -- --  Alcohol Usage Interventions Intervention Not Indicated (Score <7) -- -- --  Financial Strain Interventions -- -- Intervention Not Indicated Intervention Not Indicated  Physical  Activity Interventions -- -- Intervention Not Indicated  [Patient works full time 1PM to 9PM and has 48 month old infant.  Currently patient does not have time to exercise.] --  Stress Interventions -- -- Intervention Not Indicated Intervention Not Indicated  [has newborn]  Social Connections Interventions -- -- Intervention Not Indicated Intervention Not Indicated     BSW completed a telephone outreach with patient to follow up on dental and utility resources. Patient stated she did not receive the email. BSW verified the email address given and patient stated it was the right email. BSW resent resources. No other resources are needed at this time.  Advanced Directives Status:  Not addressed in this encounter.  Care Plan                 No Known Allergies  Medications Reviewed Today     Reviewed by Danie Chandler, RN (Registered Nurse) on 05/15/22 at 1003  Med List Status: <None>   Medication Order Taking? Sig Documenting Provider Last Dose Status Informant  acetaminophen (TYLENOL) 500 MG tablet [id number] No Take 1,000 mg by mouth every 6 (six) hours as needed for mild pain. [provider] Taking Active Self  acetaZOLAMIDE (DIAMOX) 125 MG tablet [id number] No Take 2 tablets (250 mg total) by mouth 2 (two) times daily. Drema Dallas, DO Taking Active Self  cetirizine (ZYRTEC) 10 MG tablet [id number] No Take 10 mg by mouth daily as needed for allergies. [provider] Taking Active Self  diclofenac Sodium (VOLTAREN) 1 % GEL [id number] No Apply 4 g topically 4 (four) times daily as needed for pain. [provider] Taking Active Self  docusate sodium (COLACE) 100 MG capsule [id number] No Take 1 capsule (100 mg total) by mouth at bedtime.  Patient taking differently: Take 100 mg by mouth daily as needed for mild constipation.  Rolly Salter, MD Taking Active Self  enoxaparin (LOVENOX) 100 MG/ML injection [id number]  Inject 1 mL (100 mg total) into the skin daily for 30  doses. Pollyann Samples, NP  Active   metoprolol succinate (TOPROL-XL) 25 MG 24 hr tablet [id number] No Take 1 tablet (25 mg total) by mouth daily. Arnetha Courser, MD Taking Active Self  neomycin-bacitracin-polymyxin [phone] OINT [id number]  Apply 1 Application topically 2 (two) times daily. Pollyann Samples, NP  Active   polyethylene glycol (MIRALAX / GLYCOLAX) 17 g packet [id number] No Take 17 g by mouth daily.  Patient taking differently: Take 17 g by mouth daily as needed for mild constipation.   Rolly Salter, MD Taking Active Self  Prenatal Vit-Fe Fumarate-FA (PRENATAL VITAMIN) 27-0.8 MG TABS [id number] No Take 1 tablet by mouth daily. Aviva Signs, CNM Taking Active Self  tiZANidine (ZANAFLEX) 2 MG tablet [id number] No Take 2 mg by mouth 2 (two) times daily as needed for muscle spasms. [provider] Taking Active Self  Vitamin D, Ergocalciferol, (DRISDOL) 1.25 MG (50000 UNIT) CAPS capsule [id number] No Take 50,000 Units by mouth every 7 (seven) days. 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   Acute respiratory failure with hypoxia (HCC) 01/26/2021   Chronic right arterial ischemic stroke, MCA (middle cerebral artery) 01/23/2021   Pulmonary embolism (HCC) 01/23/2021   Leukocytosis 01/23/2021   Thrombocytopenia (HCC) 01/23/2021   Elevated liver enzymes 01/23/2021   Chest pain 01/23/2021   Back pain 01/23/2021   Vaginal delivery 12/05/2020   Headache 11/28/2020   HTN (hypertension) 11/28/2020   Severe preeclampsia, delivered 11/04/2020   IUGR (intrauterine growth  restriction) affecting care of mother 10/31/2020   Thrombocytopenia affecting pregnancy (HCC) 05/27/2020   Acute right arterial ischemic stroke, middle cerebral artery (MCA) (HCC)    Dural venous sinus thrombosis    Idiopathic intracranial hypertension    Cerebral venous thrombosis 07/24/2018    Conditions to be addressed/monitored per PCP order:   community resources  There are no care plans that you recently modified to display for this patient.   Follow up:  Patient agrees to Care Plan and Follow-up.  Plan: The Managed Medicaid care management team will reach out to the patient again over the next 30-45 days.  Date/time of next scheduled Social Work care management/care coordination outreach:  07/23/22  Gus Puma, Kenard Gower, [facility]  [facility]  High Risk Managed Medicaid Team  [phone]

## 2022-06-15 NOTE — Patient Outreach (Signed)
  Medicaid Managed Care   Unsuccessful Attempt Note   06/15/2022 Name: Andrea Hayes MRN: [id number] DOB: 01/08/1991  Referred by: Fleet Contras, MD Reason for referral : High Risk Managed Medicaid (Unsuccessful telephone outreach)   An unsuccessful telephone outreach was attempted today. The patient was referred to the case management team for assistance with care management and care coordination.    Follow Up Plan: The Managed Medicaid care management team will reach out to the patient again over the next 30 business  days. and The  Patient has been provided with contact information for the Managed Medicaid care management team and has been advised to call with any health related questions or concerns.    Kathi Der RN, BSN [place]  [facility] - Managed Medicaid High Risk [phone]

## 2022-06-15 NOTE — Patient Instructions (Signed)
Hi Ms. Buttery, sorry to have missed you today - as a part of your Medicaid benefit, you are eligible for care management and care coordination services at no cost or copay. I was unable to reach you by phone today but would be happy to help you with your health related needs. Please feel free to call me at [phone]  A member of the Managed Medicaid care management team will reach out to you again over the next 30 business days.   Kathi Der RN, BSN [place]  [facility] [employer] - Managed Medicaid High Risk [phone]

## 2022-06-15 NOTE — Patient Instructions (Signed)
Visit Information  Andrea Hayes was given information about Medicaid Managed Care team care coordination services as a part of their [facility] Medicaid benefit. Andrea Hayes verbally consented to engagement with the [facility] Managed Care team.   If you are experiencing a medical emergency, please call 911 or report to your local emergency department or urgent care.   If you have a non-emergency medical problem during routine business hours, please contact your provider's office and ask to speak with a nurse.   For questions related to your [facility] health plan, please call: [phone] or go here:[email or web address]  If you would like to schedule transportation through your [facility] plan, please call the following number at least 2 days in advance of your appointment: [phone].  You can also use the MTM portal or MTM mobile app to manage your rides. For the portal, please go to [email or web address]  Call the [facility] Crisis Line at [phone], at any time, 24 hours a day, 7 days a week. If you are in danger or need immediate medical attention call 911.  If you would like help to quit smoking, call 1-800-QUIT-NOW (949-151-2266) OR Espaol: 1-855-Djelo-Ya (1-937-902-4097) o para ms informacin haga clic aqu or Text READY to 353-299 to register via text  Andrea Hayes - following are the goals we discussed in your visit today:   Goals Addressed   None      Social Worker will follow up on 07/23/22.   Gus Puma, BSW, [place] [facility]  [place]  High Risk Managed Medicaid Team  [phone]   Following is a copy of your plan of care:  There are no care plans that you recently modified to display for this patient.

## 2022-06-21 ENCOUNTER — Telehealth: Payer: Self-pay | Admitting: Nurse Practitioner

## 2022-06-21 NOTE — Telephone Encounter (Signed)
Patient called to r/s for earlier appointment time. Patient notified of new appointment date/time.

## 2022-06-25 NOTE — Progress Notes (Unsigned)
[facility]   [phone] [phone]   Clinic Follow up Note   Patient Care Team: Fleet Contras, MD as PCP - General (Internal Medicine) Drema Dallas, DO as Consulting Physician (Neurology) Leonie Douglas, MD as Consulting Physician (Vascular Surgery) Malachy Mood, MD as Consulting Physician (Hematology) Craft, Calvert Cantor, RN as Case Manager Shaune Leeks as Social Worker 06/27/2022  CHIEF COMPLAINT: Follow up recurrent DVT/PE  CURRENT THERAPY: Previously on coumadin 10 mg po daily, switched to lovenox daily 05/03/22 for family planning/wanting to get pregnant  INTERVAL HISTORY: Andrea Hayes presents for symptom management visit. Last seen by me 05/03/22. She desired to get pregnant and switched to lovenox. Not pregnant yet but not preventing. She had been doing her injections without difficulty until she developed painful nodules on her abdomen about a week ago, held Lovenox since then and called for an appointment.  She has never had this before. Denies other changes in her health.  Denies fever or chills.  All other systems were reviewed with the patient and are negative.  MEDICAL HISTORY:  Past Medical History:  Diagnosis Date   DVT (deep venous thrombosis) (HCC)    Stroke [facility])     SURGICAL HISTORY: Past Surgical History:  Procedure Laterality Date   BIOPSY  04/05/2021   Procedure: BIOPSY;  Surgeon: Jenel Lucks, MD;  Location: [facility] ENDOSCOPY;  Service: Gastroenterology;;   COLONOSCOPY N/A 04/05/2021   Procedure: COLONOSCOPY;  Surgeon: Jenel Lucks, MD;  Location: [facility] ENDOSCOPY;  Service: Gastroenterology;  Laterality: N/A;   ESOPHAGOGASTRODUODENOSCOPY N/A 04/05/2021   Procedure: ESOPHAGOGASTRODUODENOSCOPY (EGD);  Surgeon: Jenel Lucks, MD;  Location: [facility] ENDOSCOPY;  Service: Gastroenterology;  Laterality: N/A;   LOWER EXTREMITY VENOGRAPHY Right 01/22/2022   Procedure: LOWER EXTREMITY VENOGRAPHY;  Surgeon: Leonie Douglas, MD;   Location: [facility] INVASIVE CV LAB;  Service: Cardiovascular;  Laterality: Right;   NO PAST SURGERIES     PERIPHERAL VASCULAR BALLOON ANGIOPLASTY  03/24/2021   Procedure: PERIPHERAL VASCULAR BALLOON ANGIOPLASTY;  Surgeon: Leonie Douglas, MD;  Location: [facility] INVASIVE CV LAB;  Service: Cardiovascular;;  bilateral common iliacs   PERIPHERAL VASCULAR THROMBECTOMY N/A 03/23/2021   Procedure: PERIPHERAL VASCULAR THROMBECTOMY;  Surgeon: Maeola Harman, MD;  Location: [facility] INVASIVE CV LAB;  Service: Cardiovascular;  Laterality: N/A;   PERIPHERAL VASCULAR THROMBECTOMY N/A 03/24/2021   Procedure: LYSIS RECHECK;  Surgeon: Leonie Douglas, MD;  Location: [facility] INVASIVE CV LAB;  Service: Cardiovascular;  Laterality: N/A;    I have reviewed the social history and family history with the patient and they are unchanged from previous note.  ALLERGIES:  has No Known Allergies.  MEDICATIONS:  Current Outpatient Medications  Medication Sig Dispense Refill   acetaminophen (TYLENOL) 500 MG tablet Take 1,000 mg by mouth every 6 (six) hours as needed for mild pain.     acetaZOLAMIDE (DIAMOX) 125 MG tablet Take 2 tablets (250 mg total) by mouth 2 (two) times daily. 120 tablet 5   cetirizine (ZYRTEC) 10 MG tablet Take 10 mg by mouth daily as needed for allergies.     diclofenac Sodium (VOLTAREN) 1 % GEL Apply 4 g topically 4 (four) times daily as needed for pain.     docusate sodium (COLACE) 100 MG capsule Take 1 capsule (100 mg total) by mouth at bedtime. (Patient taking differently: Take 100 mg by mouth daily as needed for mild constipation.) 10 capsule 0   enoxaparin (LOVENOX) 100 MG/ML injection Inject 1 mL (100 mg  total) into the skin daily for 30 doses. 30 mL 0   metoprolol succinate (TOPROL-XL) 25 MG 24 hr tablet Take 1 tablet (25 mg total) by mouth daily. 30 tablet 1   neomycin-bacitracin-polymyxin [phone] OINT Apply 1 Application topically 2 (two) times daily. 15 g 0   polyethylene glycol (MIRALAX /  GLYCOLAX) 17 g packet Take 17 g by mouth daily. (Patient taking differently: Take 17 g by mouth daily as needed for mild constipation.) 14 each 0   Prenatal Vit-Fe Fumarate-FA (PRENATAL VITAMIN) 27-0.8 MG TABS Take 1 tablet by mouth daily. 30 tablet 12   tiZANidine (ZANAFLEX) 2 MG tablet Take 2 mg by mouth 2 (two) times daily as needed for muscle spasms.     Vitamin D, Ergocalciferol, (DRISDOL) 1.25 MG (50000 UNIT) CAPS capsule Take 50,000 Units by mouth every 7 (seven) days. Saturday     No current facility-administered medications for this visit.    PHYSICAL EXAMINATION:  Vitals:   06/27/22 1126  BP: 104/64  Pulse: 75  Resp: 18  Temp: 98.2 F (36.8 C)  SpO2: 98%   Filed Weights   06/27/22 1126  Weight: 144 lb 5 oz (65.5 kg)    GENERAL:alert, no distress and comfortable SKIN: superficial subq nodules over the abdomen from pelvis to sternum. No erythema or drainage. No rash  EYES: sclera clear LUNGS:normal breathing effort HEART: trace bilateral lower extremity edema ABDOMEN:abdomen soft, non-tender and normal bowel sounds NEURO: alert & oriented x 3 with fluent speech, no focal motor/sensory deficits  LABORATORY DATA:  I have reviewed the data as listed    Latest Ref Rng & Units 06/27/2022   11:01 AM 05/03/2022    8:47 AM 04/05/2022    8:30 AM  CBC  WBC 4.0 - 10.5 K/uL 5.0  4.0  5.5   Hemoglobin 12.0 - 15.0 g/dL 47.4  25.9  56.3   Hematocrit 36.0 - 46.0 % 40.7  43.5  38.5   Platelets 150 - 400 K/uL 320  341  380         Latest Ref Rng & Units 01/20/2022    7:03 PM 12/20/2021    9:02 AM 11/01/2021    2:12 AM  CMP  Glucose 70 - 99 mg/dL 90  97  88   BUN 6 - 20 mg/dL 7  8  6    Creatinine 0.44 - 1.00 mg/dL  8.75  6.43   Sodium 135 - 145 mmol/L 137  136  137   Potassium 3.5 - 5.1 mmol/L 4.1  3.5  4.0   Chloride 98 - 111 mmol/L 104  104  102   CO2 22 - 32 mmol/L 27  26  27    Calcium 8.9 - 10.3 mg/dL 9.4  9.3  9.7   Total Protein 6.5 - 8.1 g/dL  7.8    Total  Bilirubin 0.3 - 1.2 mg/dL  0.3    Alkaline Phos 38 - 126 U/L  107    AST 15 - 41 U/L  21    ALT 0 - 44 U/L  37        RADIOGRAPHIC STUDIES: I have personally reviewed the radiological images as listed and agreed with the findings in the report. No results found.   ASSESSMENT & PLAN: 31 year old female    Subcutaneous nodules over the abdomen -Onset 1 week, no obvious etiology -exam shows multiple subq nodules over the abdomen from pelvis up to low chest of varying size, and ttp -Pt holding lovenox for  1 week -She is being referred for soft tissue US of the abdomen for further evaluation -Given that she has had recurrent DVT when she skipped coumadin in the past. I recommend for her to resume lovenox until further recommendations can be made   2.  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  -She developed acute lower extremity DVT on Xarelto, concerning for Xarelto failure.  Xarelto was stopped and she was treated with heparin in the hospital, and transition to Coumadin with INR goal 2-3 -INR had been difficult to keep therapeutic, she missed 3 doses and developed acute RLE DVT, discharged on lovenox bridge 01/22/22. -She developed hematuria and supratherapeutic INR, lovenox was stopped 8/3. She has been on coumadin  -She was seen by vascular surgeon Dr. 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 -Given her desire to conceive and have another child, and she would like to actively start trying, coupled with her difficulty keeping her INR therapeutic, we switched her to lovenox injections in 05/2022. She reports compliance.  -We discussed pregnancy is a hypercoagulable state, and her risk of thrombosis risk increases even after delivery. She understands    PLAN: -STAT abd US soft tissue, will call with results and recommendations -F/up pending work up -Resume lovenox injections       Orders Placed This Encounter  Procedures   US Abdomen Limited    Soft tissue US of pt's abdomen.  Pt had small nodules that have developed on her abdomen.  Pt use to be on Lovenox but has been off Lovenox for over 1 wk.    Standing Status:   Future    Standing Expiration Date:   06/27/2023    Order Specific Question:   Reason for Exam (SYMPTOM  OR DIAGNOSIS REQUIRED)    Answer:   nodules on patient's abdomen    Order Specific Question:   Preferred imaging location?    Answer:   [facility]    Order Specific Question:   Release to patient    Answer:   Immediate    Order Specific Question:   Call Results- Best Contact Number?    Answer:   [phone]   All questions were answered. The patient knows to call the clinic with any problems, questions or concerns. No barriers to learning was detected. I spent 20 minutes counseling the patient face to face. The total time spent in the appointment was 30 minutes and more than 50% was on counseling and review of test results and coordination of care.      Pollyann Samples, NP 06/27/22

## 2022-06-27 ENCOUNTER — Inpatient Hospital Stay: Payer: Medicaid Other | Attending: Nurse Practitioner

## 2022-06-27 ENCOUNTER — Inpatient Hospital Stay (HOSPITAL_BASED_OUTPATIENT_CLINIC_OR_DEPARTMENT_OTHER): Payer: Medicaid Other | Admitting: Nurse Practitioner

## 2022-06-27 ENCOUNTER — Encounter: Payer: Self-pay | Admitting: Nurse Practitioner

## 2022-06-27 ENCOUNTER — Other Ambulatory Visit: Payer: Self-pay

## 2022-06-27 VITALS — BP 104/64 | HR 75 | Temp 98.2°F | Resp 18 | Wt 144.3 lb

## 2022-06-27 DIAGNOSIS — I82411 Acute embolism and thrombosis of right femoral vein: Secondary | ICD-10-CM | POA: Diagnosis not present

## 2022-06-27 DIAGNOSIS — I82501 Chronic embolism and thrombosis of unspecified deep veins of right lower extremity: Secondary | ICD-10-CM | POA: Insufficient documentation

## 2022-06-27 DIAGNOSIS — R222 Localized swelling, mass and lump, trunk: Secondary | ICD-10-CM | POA: Insufficient documentation

## 2022-06-27 DIAGNOSIS — R229 Localized swelling, mass and lump, unspecified: Secondary | ICD-10-CM | POA: Diagnosis not present

## 2022-06-27 DIAGNOSIS — Z86718 Personal history of other venous thrombosis and embolism: Secondary | ICD-10-CM | POA: Diagnosis not present

## 2022-06-27 DIAGNOSIS — Z7901 Long term (current) use of anticoagulants: Secondary | ICD-10-CM | POA: Insufficient documentation

## 2022-06-27 LAB — CBC WITH DIFFERENTIAL (CANCER CENTER ONLY)
Abs Immature Granulocytes: 0.01 10*3/uL (ref 0.00–0.07)
Basophils Absolute: 0.1 10*3/uL (ref 0.0–0.1)
Basophils Relative: 1 %
Eosinophils Absolute: 0 10*3/uL (ref 0.0–0.5)
Eosinophils Relative: 1 %
HCT: 40.7 % (ref 36.0–46.0)
Hemoglobin: 12.5 g/dL (ref 12.0–15.0)
Immature Granulocytes: 0 %
Lymphocytes Relative: 24 %
Lymphs Abs: 1.2 10*3/uL (ref 0.7–4.0)
MCH: 24.7 pg — ABNORMAL LOW (ref 26.0–34.0)
MCHC: 30.7 g/dL (ref 30.0–36.0)
MCV: 80.4 fL (ref 80.0–100.0)
Monocytes Absolute: 0.4 10*3/uL (ref 0.1–1.0)
Monocytes Relative: 8 %
Neutro Abs: 3.3 10*3/uL (ref 1.7–7.7)
Neutrophils Relative %: 66 %
Platelet Count: 320 10*3/uL (ref 150–400)
RBC: 5.06 MIL/uL (ref 3.87–5.11)
RDW: 14.7 % (ref 11.5–15.5)
WBC Count: 5 10*3/uL (ref 4.0–10.5)
nRBC: 0 % (ref 0.0–0.2)

## 2022-06-27 LAB — PROTIME-INR
INR: 1 (ref 0.8–1.2)
Prothrombin Time: 12.8 seconds (ref 11.4–15.2)

## 2022-06-28 ENCOUNTER — Ambulatory Visit (HOSPITAL_COMMUNITY)
Admission: RE | Admit: 2022-06-28 | Discharge: 2022-06-28 | Disposition: A | Discharge status: Home / Self Care | Payer: Medicaid Other | Source: Ambulatory Visit | Attending: Nurse Practitioner | Admitting: Nurse Practitioner

## 2022-06-28 DIAGNOSIS — Z86718 Personal history of other venous thrombosis and embolism: Secondary | ICD-10-CM | POA: Diagnosis present

## 2022-06-28 DIAGNOSIS — R229 Localized swelling, mass and lump, unspecified: Secondary | ICD-10-CM | POA: Insufficient documentation

## 2022-06-28 DIAGNOSIS — I82411 Acute embolism and thrombosis of right femoral vein: Secondary | ICD-10-CM | POA: Insufficient documentation

## 2022-07-06 ENCOUNTER — Other Ambulatory Visit: Payer: Self-pay

## 2022-07-10 ENCOUNTER — Telehealth: Payer: Self-pay | Admitting: Nurse Practitioner

## 2022-07-10 NOTE — Telephone Encounter (Signed)
Called pt to review abd [place] which shows collaterals throughout the abdominal wall, and discussed findings with vascular surgeon Dr. Stanford Breed who feels this is expected finding and stable for her.   Recommend to continue lovenox injections and avoid these areas. She states she does not need a refill. She verbalizes understanding and agrees with the plan.   F/up in 3 months. She knows to call sooner if needed and appreciates the call.   Andrea Rue, NP

## 2022-07-12 NOTE — Progress Notes (Signed)
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   UPDATE: Currently not on acetazolamide.  Has not had an eye exam since last visit.  Denies headache, visual changes or pulsatile tinnitus.  Last visit, she was referred to ophthalmology for recheck.  She hasn't had one performed.     HISTORY: She began experiencing blurred vision and intermittent headaches on top of head.  In October.  She also reports visual obscurations as well.  No pulsatile tinnitus.  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  The next day she went to the ED for further evaluation and treatment.  MRI of brain with and without contrast was personally reviewed and demonstrated tortuous optic nerves with prominent optic nerve sheath fluid which may be seen in setting of intracranial hypertension.  Neurology was consulted and LP was recommended.  However, she declined the procedure and left against medical advice.    She followed up with me in January 2020 and underwent workup for intracranial hypertension.  Lumbar puncture from 07/09/18 demonstrated an opening pressure of 47 cm water with closing pressure of 16 cm water.  CSF cell count, glucose, protein and culture were normal.  She was started on Diamox 500mg  twice daily.  MRV of head from 07/12/18 was personally reviewed and was normal with no evidence of thrombosis or stenosis.  She  presented to [facility] on 07/24/18 for severe headache with nausea, vomiting and photophobia.  CT of head was personally reviewed and showed dural venous sinus thrombosis involving the superior sagittal sinus, straight sinus, right transverse sinus and sigmoid sinus and smaller branching veins.  Follow up CTV of head confirmed acute venous sinus thrombosis from superior sagittal sinus to right sigmoid sinus and within intracerebral veins.  Hypercoagulable workup was unremarkable, including homocystein 4.8, antithrombin III mildly elevated at 128, low protein S activity of 22, total protein S 109, protein C activity 180, total protein C 127, lupus anticoagulant 25.3, DRVVT 44.7, beta-2-glycoprotein antibodies <9, factor V leiden mutation negative, prothrombin gene mutation negative, cardiolipin antibodies negative, sickle cell screen negative and negative for pregnancy.  She was started on lovenox and bridged to warfarin.  She was discharged on Fioricet for her headaches.  She was started on topiramate 50mg  twice daily.  Headache never improved and returned to the ED on 08/04/18. INR was therapeutic at 2.43. CT personally reviewed and showed subacute dural venous sinus thrombosis and intracerebral veins with partial recanalization and no acute component.  She received IV Reglan and Benadryl and headache resolved.  Headaches subsequently subsided, so she self-discontinued medications - warfarin, acetazolamide and topiramate, and lost to follow up.  She became pregnant in late 2021.  Started having headaches and pulsatile tinnitus again.  MRV of head without contrast on 05/24/2020 showed worsened segmental superior sagittal sinus thrombosis (compared to CTV 08/05/18) and flow in both transverse and sigmoid sinuses but with chronic scarring of right transverse  sinus region, likely sequelae of previous thrombosis/stroke but cannot rule out active/recent thrombosis.  Given that she had prematurely stopped warfarin,  we took precaution and had her on Lovenox during her pregnancy.  She was also restarted on acetazolamide 500mg  twice daily.  She was hospitalized for headache on 08/12/2020.  Repeat MRI/MRV of head without contrast at that time were stable.  Headaches are improved.  Denies visual obscurations, blurred vision or pulsatile tinnitus.  She stopped taking the acetazolamide regularly. Underwent induction of labor at 37 weeks on 11/04/2020 and gave birth to a girl.  Baby is healthy. Patient is nursing.  She presented to the hospital on 11/28/2020 for severe headaches  MRI and MRV of brain with and without contrast was negative for acute stroke/bleed or acute venous thrombosis.  She was restarted on acetazolamide 250mg  twice daily.    PAST MEDICAL HISTORY: Past Medical History:  Diagnosis Date   DVT (deep venous thrombosis) (HCC)    Stroke [facility])     MEDICATIONS: Current Outpatient Medications on File Prior to Visit  Medication Sig Dispense Refill   acetaminophen (TYLENOL) 500 MG tablet Take 1,000 mg by mouth every 6 (six) hours as needed for mild pain.     acetaZOLAMIDE (DIAMOX) 125 MG tablet Take 2 tablets (250 mg total) by mouth 2 (two) times daily. 120 tablet 5   cetirizine (ZYRTEC) 10 MG tablet Take 10 mg by mouth daily as needed for allergies.     diclofenac Sodium (VOLTAREN) 1 % GEL Apply 4 g topically 4 (four) times daily as needed for pain.     docusate sodium (COLACE) 100 MG capsule Take 1 capsule (100 mg total) by mouth at bedtime. (Patient taking differently: Take 100 mg by mouth daily as needed for mild constipation.) 10 capsule 0   enoxaparin (LOVENOX) 100 MG/ML injection Inject 1 mL (100 mg total) into the skin daily for 30 doses. 30 mL 0   metoprolol succinate (TOPROL-XL) 25 MG 24 hr tablet Take 1 tablet (25 mg total) by mouth daily. 30 tablet 1   neomycin-bacitracin-polymyxin [phone] OINT Apply 1 Application topically 2 (two) times daily. 15 g 0   polyethylene glycol (MIRALAX / GLYCOLAX)  17 g packet Take 17 g by mouth daily. (Patient taking differently: Take 17 g by mouth daily as needed for mild constipation.) 14 each 0   Prenatal Vit-Fe Fumarate-FA (PRENATAL VITAMIN) 27-0.8 MG TABS Take 1 tablet by mouth daily. 30 tablet 12   tiZANidine (ZANAFLEX) 2 MG tablet Take 2 mg by mouth 2 (two) times daily as needed for muscle spasms.     Vitamin D, Ergocalciferol, (DRISDOL) 1.25 MG (50000 UNIT) CAPS capsule Take 50,000 Units by mouth every 7 (seven) days. Saturday     No current facility-administered medications on file prior to visit.    ALLERGIES: No Known Allergies  FAMILY HISTORY: Family History  Problem Relation Age of Onset   Asthma Father    Obesity Neg Hx    Hypertension Neg Hx    Diabetes Neg Hx    Cancer Neg Hx       Objective:  Blood pressure 116/79, pulse 80, resp. rate 18, height 5\' 3"  (1.6 m), weight 149 lb (67.6 kg), SpO2 96 %, not currently breastfeeding. General: No acute distress.  Patient appears well-groomed.   Head:  Normocephalic/atraumatic Eyes:  Fundi examined but not visualized Neck: supple, no paraspinal tenderness, full range of motion Heart:  Regular rate and rhythm Neurological Exam: alert and oriented to person, place,  and time.  Speech fluent and not dysarthric, language intact.  CN II-XII intact. Bulk and tone normal, muscle strength 5/5 throughout.  Sensation to light touch intact.  Deep tendon reflexes 2+ throughout.  Finger to nose testing intact.  Gait normal, Romberg negative.   Metta Clines, DO  CC: [name], MD

## 2022-07-16 ENCOUNTER — Encounter: Payer: Self-pay | Admitting: Neurology

## 2022-07-16 ENCOUNTER — Ambulatory Visit: Payer: Medicaid Other | Admitting: Neurology

## 2022-07-16 VITALS — BP 116/79 | HR 80 | Resp 18 | Ht 63.0 in | Wt 149.0 lb

## 2022-07-16 DIAGNOSIS — Z86718 Personal history of other venous thrombosis and embolism: Secondary | ICD-10-CM | POA: Diagnosis not present

## 2022-07-16 DIAGNOSIS — G932 Benign intracranial hypertension: Secondary | ICD-10-CM | POA: Diagnosis not present

## 2022-07-16 NOTE — Patient Instructions (Signed)
Refer to ophthalmology.  If you do not hear from anyone by next Monday, contact us. Otherwise follow up in 6 months.

## 2022-07-19 ENCOUNTER — Ambulatory Visit: Payer: Medicaid Other | Admitting: Obstetrics and Gynecology

## 2022-07-23 ENCOUNTER — Other Ambulatory Visit: Payer: Medicaid Other

## 2022-07-23 NOTE — Patient Outreach (Signed)
  Medicaid Managed Care   Unsuccessful Outreach Note  07/23/2022 Name: Andrea Hayes MRN: [id number] DOB: 03-04-1991  Referred by: [name], MD Reason for referral : High Risk Managed Medicaid (MM social work unsuccessful telephone outreach )   An unsuccessful telephone outreach was attempted today. The patient was referred to the case management team for assistance with care management and care coordination.   Follow Up Plan: The patient has been provided with contact information for the care management team and has been advised to call with any health related questions or concerns.   Mickel Fuchs, BSW, [place] Managed Medicaid Team  [phone]

## 2022-07-23 NOTE — Patient Instructions (Signed)
  Medicaid Managed Care   Unsuccessful Outreach Note  07/23/2022 Name: Andrea Hayes MRN: [id number] DOB: 12/15/1990  Referred by: Avbuere, Edwin, MD Reason for referral : High Risk Managed Medicaid (MM social work unsuccessful telephone outreach )   An unsuccessful telephone outreach was attempted today. The patient was referred to the case management team for assistance with care management and care coordination.   Follow Up Plan: The patient has been provided with contact information for the care management team and has been advised to call with any health related questions or concerns.   Louay Myrie, BSW, MHA [facility]  [place]  High Risk Managed Medicaid Team  [phone]  

## 2022-08-02 ENCOUNTER — Inpatient Hospital Stay: Payer: Medicaid Other

## 2022-08-02 ENCOUNTER — Inpatient Hospital Stay: Payer: Medicaid Other | Admitting: Nurse Practitioner

## 2022-08-08 ENCOUNTER — Encounter: Payer: Self-pay | Admitting: Obstetrics and Gynecology

## 2022-08-08 ENCOUNTER — Other Ambulatory Visit: Payer: Medicaid Other | Admitting: Obstetrics and Gynecology

## 2022-08-08 NOTE — Patient Instructions (Signed)
Hi Ms. Nugent, thanks for speaking with me-don't forget to schedule your appt with the eye provider.  Ms. Javed was given information about Medicaid Managed Care team care coordination services as a part of their [facility] Medicaid benefit. Kathaleya Pires verbally consented to engagement with the [facility] Managed Care team.   If you are experiencing a medical emergency, please call 911 or report to your local emergency department or urgent care.   If you have a non-emergency medical problem during routine business hours, please contact your provider's office and ask to speak with a nurse.   For questions related to your [facility] health plan, please call: [phone] or go here:[email or web address]  If you would like to schedule transportation through your [facility] plan, please call the following number at least 2 days in advance of your appointment: [phone].  You can also use the MTM portal or MTM mobile app to manage your rides. For the portal, please go to [email or web address].  Call the [place] at [phone], at any time, 24 hours a day, 7 days a week. If you are in danger or need immediate medical attention call 911.  If you would like help to quit smoking, call 1-800-QUIT-NOW 418-688-1912) OR Espaol: 1-855-Djelo-Ya (1-025-852-7782) o para ms informacin haga clic aqu or Text READY to 200-400 to register via text  Ms. Mcnellis - following are the goals we discussed in your visit today:   Timeframe:  Long-Range Goal Priority:  High Start Date:    03/09/22                         Expected End Date:     ongoing                  Follow Up Date  09/07/22   - practice safe sex - schedule appointment for flu shot - schedule appointment for vaccines needed due to my age or health - schedule recommended health tests (blood work, mammogram, colonoscopy, pap test) - schedule and keep appointment for annual check-up    Why is this important?    Screening tests can find diseases early when they are easier to treat.  Your doctor or nurse will talk with you about which tests are important for you.  Getting shots for common diseases like the flu and shingles will help prevent them.   08/08/22:  patient seen by HEME in December and NEURO in January  Patient verbalizes understanding of instructions and care plan provided today and agrees to view in [place]. Active MyChart status and patient understanding of how to access instructions and care plan via MyChart confirmed with patient.     The Managed Medicaid care management team will reach out to the patient again over the next 30 business  days.  The  Patient  has been provided with contact information for the Managed Medicaid care management team and has been advised to call with any health related questions or concerns.   Aida Raider RN, BSN [place] Management Coordinator - Managed Medicaid High Risk [phone]   Following is a copy of your plan of care:  Care Plan : [place] of Care  Updates made by Gayla Medicus, RN since 08/08/2022 12:00 AM     Problem: Chronic Disease Management and Care Coordination Needs for Deep Vein Thrombosis/stroke management and HTN   Priority: High  Onset Date: 06/06/2021     [facility]  Goal: Development of Plan of Care for Management and Care Coordination Needs (DVT/stroke management and HTN)   Start Date: 06/06/2021  Expected End Date: 11/06/2022  Recent Progress: On track  Priority: High  Note:   Current Barriers:  Knowledge Deficits related to plan of care for management of HTN and deep vein thrombosis, stroke  Care Coordination needs related to Limited education about deep vein thrombosis, anticoagulants, strokes* Chronic Disease Management support and education needs related to HTN and deep vein thrombosis, stroke.  2//7/24:  Patient received dental and utility resources.  Patient to schedule appt  with eye provider.  Continues on Lovenox-following with HEME and NEURO  RN CM Clinical Goal(s):  Patient will verbalize understanding of plan for management of HTN and recurrent Deep Vein Thrombosis (DVT), stroke as evidenced by good control of HTN and recurrent DVT verbalize basic understanding of  HTN and deep vein thrombosis, stroke disease process and self health management plan as evidenced by for deep vein thrombosis, stroke to include use of anticoagulants and diet and hypertension take all medications exactly as prescribed and will call provider for medication related questions as evidenced by medication compliance. attend all scheduled medical appointments as evidenced by attending all scheduled appointments demonstrate Improved and Ongoing adherence to prescribed treatment plan for HTN and deep vein thrombosis, stroke as evidenced by therapeutic INR values continue to work with RN Care Manager to address care management and care coordination needs related to  HTN and recurrent DVT, stroke prevention as evidenced by adherence to CM Team Scheduled appointments through collaboration with RN Care manager, provider, and care team.   Interventions: Follow up outreaches to review labs, review upcoming appointments, to educate and answer questions Inter-disciplinary care team collaboration (see longitudinal plan of care) Evaluation of current treatment plan related to  self management and patient's adherence to plan as established by provider Follow up with patient to assess for any worsening symptoms related to DVT or care coordination needs Reviewed recent labs, recent blood pressure readings and discussed importance of hydration and it's affect on blood pressure. No reported leg pain, no headaches Reviewed upcoming appointments with patient. Collaborated with BSW BSW referral for dental resources-completed BSW completed a telephone outreach with patient for dental resources. Patient states  dental resources are needed. SDOH completed with patient and she states she would like utility resources. Patient currently receives foodsstamps and does not have any issues with rent or transportation at this time. Patient requested resources be emailed to her at [email or web address]. Resources were emailed.    Deep vein thrombosis, stroke Goal on track:  Yes. Evaluation of current treatment plan related to  deep vein thrombosis, stroke , Limited education about home management, medicine, diet related to deep vein thrombosis, stroke* self-management and patient's adherence to plan as established by provider. Discussed plans with patient for ongoing care management follow up and provided patient with direct contact information for care management team Evaluation of current treatment plan related to deep vein thrombosis, stroke and patient's adherence to plan as established by provider Provided education to patient re: DVT, Stroke, anticoagulants, bleeding risks, INR values with therapeutic range of 2.5-3.5 Reviewed medications with patient and discussed action plan, worsening symptoms to call MD office for any changes Reviewed scheduled/upcoming provider appointments  Discussed plans with patient for ongoing care management follow up and provided patient with direct contact information for care management team Provided continued education on signs & symptoms of DVT: swelling, redness and warmth/heat at site.  Hypertension Interventions:  (  Status:  Goal on track:  Yes.) Long Term Goal Last practice recorded BP readings:  BP Readings from Last 3 Encounters:  11/01/21 112/60  09/27/21 109/76  08/02/21 103/64    Most recent eGFR/CrCl: No results found for: EGFR  No components found for: CRCL  Evaluation of current treatment plan related to hypertension self management and patient's adherence to plan as established by provider Reviewed medications with patient and discussed importance of  compliance Discussed plans with patient for ongoing care management follow up and provided patient with direct contact information for care management team Reviewed scheduled/upcoming provider appointments   Reinforced education with patient on the importance of staying hydrated and it's direct affect on blood pressure.  Patient states she drinks "A Lot" of fluids throughout the day.  Patient denies any light headed or dizziness.   Patient verbalized understanding.  Patient Goals/Self-Care Activities: Take all medications as prescribed Attend all scheduled provider appointments Perform all self care activities independently  Perform IADL's (shopping, preparing meals, housekeeping, managing finances) independently Call provider office for new concerns or questions   Follow Up Plan:  The patient has been provided contact information via My Chart link for the care management team and has been advised to call with any health related questions or concerns.

## 2022-08-08 NOTE — Patient Outreach (Signed)
Medicaid Managed Care   Nurse Care Manager Note  08/08/2022 Name:  Andrea Hayes MRN:  [id number] DOB:  Nov 16, 1990  Andrea Hayes is an 32 y.o. year old female who is a primary patient of Fleet Contras, MD.  The Medicaid Managed Care Coordination team was consulted for assistance with:    Chronic healthcare management needs, h/o CVA, DVT, thrombophilia  Ms. Deshazo was given information about Medicaid Managed Care Coordination team services today. Finley Reach Patient agreed to services and verbal consent obtained.  Engaged with patient by telephone for follow up visit in response to provider referral for case management and/or care coordination services.   Assessments/Interventions:  Review of past medical history, allergies, medications, health status, including review of consultants reports, laboratory and other test data, was performed as part of comprehensive evaluation and provision of chronic care management services.  SDOH (Social Determinants of Health) assessments and interventions performed: SDOH Interventions    Flowsheet Row Patient Outreach Telephone from 08/08/2022 in [place] POPULATION HEALTH DEPARTMENT Patient Outreach Telephone from 05/15/2022 in [place] POPULATION HEALTH DEPARTMENT Patient Outreach Telephone from 03/09/2022 in [facility] Care Coordination Patient Outreach Telephone from 06/06/2021 in [facility] Care Coordination Patient Outreach Telephone from 06/02/2021 in [facility] Care Coordination  SDOH Interventions       Food Insecurity Interventions -- -- -- Intervention Not Indicated Intervention Not Indicated  Housing Interventions -- -- -- Intervention Not Indicated Intervention Not Indicated  Transportation Interventions -- -- -- Intervention Not Indicated Intervention Not Indicated  Utilities Interventions Intervention Not Indicated -- Intervention Not Indicated -- --  Alcohol Usage  Interventions -- Intervention Not Indicated (Score <7) -- -- --  Financial Strain Interventions -- -- -- Intervention Not Indicated Intervention Not Indicated  Physical Activity Interventions -- -- -- Intervention Not Indicated  [Patient works 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  Currently patient does not have time to exercise.] --  Stress Interventions -- -- -- Intervention Not Indicated Intervention Not Indicated  [has newborn]  Social Connections Interventions -- -- -- Intervention Not Indicated Intervention Not Indicated     Care Plan  No Known Allergies  Medications Reviewed Today     Reviewed by Danie Chandler, RN (Registered Nurse) on 08/08/22 at 1454  Med List Status: <None>   Medication Order Taking? Sig Documenting Provider Last Dose Status Informant  acetaminophen (TYLENOL) 500 MG tablet [id number] No Take 1,000 mg by mouth every 6 (six) hours as needed for mild pain. [provider] Taking Active Self  acetaZOLAMIDE (DIAMOX) 125 MG tablet [id number] No Take 2 tablets (250 mg total) by mouth 2 (two) times daily.  Patient not taking: Reported on 07/16/2022   Andrea Dallas, DO Not Taking Active Self  cetirizine (ZYRTEC) 10 MG tablet [id number] No Take 10 mg by mouth daily as needed for allergies. [provider] Taking Active Self  diclofenac Sodium (VOLTAREN) 1 % GEL [id number] No Apply 4 g topically 4 (four) times daily as needed for pain. [provider] Taking Active Self  docusate sodium (COLACE) 100 MG capsule [id number] No Take 1 capsule (100 mg total) by mouth at bedtime.  Patient taking differently: Take 100 mg by mouth daily as needed for mild constipation.   Andrea Salter, MD Taking Active Self  enoxaparin (LOVENOX) 100 MG/ML injection [id number]  Inject 1 mL (100 mg total) into the skin daily for 30 doses. Pollyann Samples, NP  Expired  06/02/22 2359   metoprolol succinate (TOPROL-XL) 25 MG 24 hr tablet [id number] No Take 1 tablet (25 mg  total) by mouth daily. Andrea Nimrod, MD Taking Active Self  neomycin-bacitracin-polymyxin [phone] OINT [id number] No Apply 1 Application topically 2 (two) times daily. Andrea Feeling, NP Taking Active   polyethylene glycol (MIRALAX / GLYCOLAX) 17 g packet [id number] No Take 17 g by mouth daily.  Patient taking differently: Take 17 g by mouth daily as needed for mild constipation.   Lavina Hamman, MD Taking Active Self  Prenatal Vit-Fe Fumarate-FA (PRENATAL VITAMIN) 27-0.8 MG TABS [id number] No Take 1 tablet by mouth daily. Andrea Hayes, CNM Taking Active Self  tiZANidine (ZANAFLEX) 2 MG tablet [id number] No Take 2 mg by mouth 2 (two) times daily as needed for muscle spasms. [provider] Taking Active Self  Vitamin D, Ergocalciferol, (DRISDOL) 1.25 MG (50000 UNIT) CAPS capsule [id number] No Take 50,000 Units by mouth every 7 (seven) days. 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   Acute respiratory failure with hypoxia ([place]) 01/26/2021   Chronic right arterial ischemic stroke, MCA (middle cerebral artery) 01/23/2021   Pulmonary embolism ([place]) 01/23/2021   Leukocytosis 01/23/2021   Thrombocytopenia ([place]) 01/23/2021   Elevated liver enzymes 01/23/2021   Chest pain 01/23/2021   Back pain 01/23/2021   Vaginal delivery 12/05/2020   Headache 11/28/2020   HTN (hypertension) 11/28/2020   Severe preeclampsia, delivered 11/04/2020   IUGR (intrauterine growth restriction) affecting care of mother 10/31/2020   Thrombocytopenia affecting pregnancy ([place]) 05/27/2020   Acute right  arterial ischemic stroke, middle cerebral artery (MCA) (HCC)    Dural venous sinus thrombosis    Idiopathic intracranial hypertension    Cerebral venous thrombosis 07/24/2018   Conditions to be addressed/monitored per PCP order:  Chronic healthcare management needs, h/o CVA, DVT, thrombophilia, HTN  Care Plan : RN Care Manager Plan of Care  Updates made by Gayla Medicus, RN since 08/08/2022 12:00 AM     Problem: Chronic Disease Management and Care Coordination Needs for Deep Vein Thrombosis/stroke management and HTN   Priority: High  Onset Date: 06/06/2021     Long-Range Goal: Development of Plan of Care for Management and Care Coordination Needs (DVT/stroke management and HTN)   Start Date: 06/06/2021  Expected End Date: 11/06/2022  Recent Progress: On track  Priority: High  Note:   Current Barriers:  Knowledge Deficits related to plan of care for management of HTN and deep vein thrombosis, stroke  Care Coordination needs related to Limited education about deep vein thrombosis, anticoagulants, strokes* Chronic Disease Management support and education needs related to HTN and deep vein thrombosis, stroke.  2//7/24:  Patient received dental and utility resources.  Patient to schedule appt with eye provider.  Continues on Lovenox-following with HEME and NEURO  RN CM Clinical Goal(s):  Patient will verbalize understanding of plan for management of HTN and recurrent Deep Vein Thrombosis (DVT), stroke as evidenced by good control of HTN and recurrent DVT verbalize basic understanding of  HTN and deep vein thrombosis,  stroke disease process and self health management plan as evidenced by for deep vein thrombosis, stroke to include use of anticoagulants and diet and hypertension take all medications exactly as prescribed and will call provider for medication related questions as evidenced by medication compliance. attend all scheduled medical appointments as evidenced by attending all scheduled  appointments demonstrate Improved and Ongoing adherence to prescribed treatment plan for HTN and deep vein thrombosis, stroke as evidenced by therapeutic INR values continue to work with RN Care Manager to address care management and care coordination needs related to  HTN and recurrent DVT, stroke prevention as evidenced by adherence to CM Team Scheduled appointments through collaboration with RN Care manager, provider, and care team.   Interventions: Follow up outreaches to review labs, review upcoming appointments, to educate and answer questions Inter-disciplinary care team collaboration (see longitudinal plan of care) Evaluation of current treatment plan related to  self management and patient's adherence to plan as established by provider Follow up with patient to assess for any worsening symptoms related to DVT or care coordination needs Reviewed recent labs, recent blood pressure readings and discussed importance of hydration and it's affect on blood pressure. No reported leg pain, no headaches Reviewed upcoming appointments with patient. Collaborated with BSW BSW referral for dental resources-completed BSW completed a telephone outreach with patient for dental resources. Patient states dental resources are needed. SDOH completed with patient and she states she would like utility resources. Patient currently receives foodsstamps and does not have any issues with rent or transportation at this time. Patient requested resources be emailed to her at [email or web address]. Resources were emailed.    Deep vein thrombosis, stroke Goal on track:  Yes. Evaluation of current treatment plan related to  deep vein thrombosis, stroke , Limited education about home management, medicine, diet related to deep vein thrombosis, stroke* self-management and patient's adherence to plan as established by provider. Discussed plans with patient for ongoing care management follow up and provided patient with  direct contact information for care management team Evaluation of current treatment plan related to deep vein thrombosis, stroke and patient's adherence to plan as established by provider Provided education to patient re: DVT, Stroke, anticoagulants, bleeding risks, INR values with therapeutic range of 2.5-3.5 Reviewed medications with patient and discussed action plan, worsening symptoms to call MD office for any changes Reviewed scheduled/upcoming provider appointments  Discussed plans with patient for ongoing care management follow up and provided patient with direct contact information for care management team Provided continued education on signs & symptoms of DVT: swelling, redness and warmth/heat at site.  Hypertension Interventions:  (Status:  Goal on track:  Yes.) Long Term Goal Last practice recorded BP readings:  BP Readings from Last 3 Encounters:  11/01/21 112/60  09/27/21 109/76  08/02/21 103/64    Most recent eGFR/CrCl: No results found for: EGFR  No components found for: CRCL  Evaluation of current treatment plan related to hypertension self management and patient's adherence to plan as established by provider Reviewed medications with patient and discussed importance of compliance Discussed plans with patient for ongoing care management follow up and provided patient with direct contact information for care management team Reviewed scheduled/upcoming provider appointments   Reinforced education with patient on the importance of staying hydrated and it's direct affect on blood pressure.  Patient states she drinks "A Lot" of fluids throughout the day.  Patient denies any light headed or dizziness.   Patient verbalized understanding.  Patient Goals/Self-Care Activities: Take all medications  as prescribed Attend all scheduled provider appointments Perform all self care activities independently  Perform IADL's (shopping, preparing meals, housekeeping, managing finances)  independently Call provider office for new concerns or questions   Follow Up Plan:  The patient has been provided contact information via My Chart link for the care management team and has been advised to call with any health related questions or concerns.    Long-Range Goal: Establish Plan of Care for Chronic Disease Management Needs   Priority: High  Note:   Timeframe:  Long-Range Goal Priority:  High Start Date:    03/09/22                         Expected End Date:     ongoing                  Follow Up Date  09/07/22   - practice safe sex - schedule appointment for flu shot - schedule appointment for vaccines needed due to my age or health - schedule recommended health tests (blood work, mammogram, colonoscopy, pap test) - schedule and keep appointment for annual check-up    Why is this important?   Screening tests can find diseases early when they are easier to treat.  Your doctor or nurse will talk with you about which tests are important for you.  Getting shots for common diseases like the flu and shingles will help prevent them.   08/08/22:  patient seen by HEME in December and NEURO in January    Follow Up:  Patient agrees to Care Plan and Follow-up.  Plan: The Managed Medicaid care management team will reach out to the patient again over the next 30 business  days. and The  Patient has been provided with contact information for the Managed Medicaid care management team and has been advised to call with any health related questions or concerns.  Date/time of next scheduled RN care management/care coordination outreach:09/07/22 at 230

## 2022-09-07 ENCOUNTER — Other Ambulatory Visit: Payer: Medicaid Other | Admitting: Obstetrics and Gynecology

## 2022-09-07 NOTE — Patient Outreach (Signed)
  Medicaid Managed Care   Unsuccessful Attempt Note   09/07/2022 Name: Andrea Hayes MRN: [id number] DOB: 1990/07/27  Referred by: [name], MD Reason for referral : High Risk Managed Medicaid (Unsuccessful telephone outreach)  An unsuccessful telephone outreach was attempted today. The patient was referred to the case management team for assistance with care management and care coordination.    Follow Up Plan: The Managed Medicaid care management team will reach out to the patient again over the next 30 business  days. and The  Patient has been provided with contact information for the Managed Medicaid care management team and has been advised to call with any health related questions or concerns.   Aida Raider RN, BSN [place]  [facility] - Managed Medicaid High Risk [phone]

## 2022-09-07 NOTE — Patient Instructions (Signed)
Visit Information  [name]  - as a part of your Medicaid benefit, you are eligible for care management and care coordination services at no cost or copay. I was unable to reach you by phone today but would be happy to help you with your health related needs. Please feel free to call me at [phone]  A member of the Managed Medicaid care management team will reach out to you again over the next 30 business  days.   Aida Raider RN, BSN [place]  [facility] - Managed Medicaid High Risk [phone]

## 2022-09-08 ENCOUNTER — Inpatient Hospital Stay (HOSPITAL_COMMUNITY): Payer: Medicaid Other

## 2022-09-08 ENCOUNTER — Inpatient Hospital Stay (HOSPITAL_COMMUNITY)
Admission: AD | Admit: 2022-09-08 | Discharge: 2022-09-08 | Disposition: A | Discharge status: Home / Self Care | Payer: Medicaid Other | Attending: Maternal & Fetal Medicine | Admitting: Maternal & Fetal Medicine

## 2022-09-08 ENCOUNTER — Encounter (HOSPITAL_COMMUNITY): Payer: Self-pay | Admitting: *Deleted

## 2022-09-08 DIAGNOSIS — O26851 Spotting complicating pregnancy, first trimester: Secondary | ICD-10-CM | POA: Diagnosis present

## 2022-09-08 DIAGNOSIS — Z3A01 Less than 8 weeks gestation of pregnancy: Secondary | ICD-10-CM | POA: Diagnosis not present

## 2022-09-08 DIAGNOSIS — O021 Missed abortion: Secondary | ICD-10-CM

## 2022-09-08 LAB — POCT PREGNANCY, URINE: Preg Test, Ur: POSITIVE — AB

## 2022-09-08 LAB — WET PREP, GENITAL
Clue Cells Wet Prep HPF POC: NONE SEEN
Sperm: NONE SEEN
Trich, Wet Prep: NONE SEEN
WBC, Wet Prep HPF POC: >=10 — AB (ref <10)
Yeast Wet Prep HPF POC: NONE SEEN

## 2022-09-08 LAB — HCG, QUANTITATIVE, PREGNANCY: hCG, Beta Chain, Quant, S: 32965 m[IU]/mL — ABNORMAL HIGH (ref <5)

## 2022-09-08 LAB — CBC
HCT: 39.1 % (ref 36.0–46.0)
Hemoglobin: 12.2 g/dL (ref 12.0–15.0)
MCH: 24.4 pg — ABNORMAL LOW (ref 26.0–34.0)
MCHC: 31.2 g/dL (ref 30.0–36.0)
MCV: 78 fL — ABNORMAL LOW (ref 80.0–100.0)
Platelets: 344 10*3/uL (ref 150–400)
RBC: 5.01 MIL/uL (ref 3.87–5.11)
RDW: 14.6 % (ref 11.5–15.5)
WBC: 5 10*3/uL (ref 4.0–10.5)
nRBC: 0 % (ref 0.0–0.2)

## 2022-09-08 MED ORDER — PROMETHAZINE HCL 25 MG PO TABS: 25.0000 mg | ORAL_TABLET | Freq: Four times a day (QID) | ORAL | 0 refills | Status: DC | PRN

## 2022-09-08 MED ORDER — OXYCODONE-ACETAMINOPHEN 5-325 MG PO TABS: 1.0000 | ORAL_TABLET | Freq: Four times a day (QID) | ORAL | 0 refills | Status: DC | PRN

## 2022-09-08 MED ORDER — MISOPROSTOL 200 MCG PO TABS: ORAL_TABLET | ORAL | 1 refills | Status: DC

## 2022-09-08 NOTE — Discharge Instructions (Signed)
What to expect after the misoprostol  Cramping, moderate to heavy bleeding, and moderate pain are normal parts of the abortion process as your uterus passes the pregnancy. Most women pass blood clots.  Cramping usually starts one to four hours after you place the misoprostol in your vagina. Bleeding usually starts between 30 minutes to four hours after you place the misoprostol in your vagina. However, it can take up to 24 hours for some women. Heavy bleeding and strong cramps usually last between one and four hours. Call or return to the hospital if you soak through more than two large maxi-pads per hour for three hours in a row.  For pain: Resting and using a heating pad or hot water bottle may help. Both Vicodin and ibuprofen usually help with the pain. You may use either one or both together. Call if your pain is not manageable with Vicodin and ibuprofen.  Vicodin: You may take one or two tablets of Vicodin every four to six hours. You may take the first pill as soon as you feel you need something for the pain. Vicodin may make you feel sleepy, nauseated, or dizzy. Do not exceed this dose.  Ibuprofen (Motrin, Advil, etc.): You may take '800mg'$  of ibuprofen (four over-the-counter tablets) every six to eight hours. You may take the first dose of pills as soon as you feel you need something for the pain. Ibuprofen usually does not cause side effects such as sleepiness, nausea, or dizziness.  Other misoprostol side effects:  The following side effects are not dangerous and usually only last one to four hours. If any of these side effects make you very uncomfortable, you may treat your symptoms with over-the-counter medicines. Call if over-the-counter medicines do not relieve your symptoms.  Nausea or vomiting  Call if you vomit several times and cannot hold down liquids. Over the counter treatment: Benadryl '25mg'$  to '50mg'$  every six hours as needed, or Dramamine '25mg'$  to '50mg'$  every six hours as  needed.  Diarrhea  Call if you have several episodes of diarrhea lasting more than four to five hours and you are having trouble drinking liquids (you may be getting dehydrated). Over the counter treatment: Imodium 2 tablets ('4mg'$ ) once.  Fever, chills  Misoprostol may cause fever or chills in the first 24 hours. After 24 hours, fever or chills may be a sign of an infection and you should call the clinic. If you already took Tylenol, Vicodin (which has '500mg'$  of Tylenol in it), or ibuprofen for pain, do not take more of the same medication for fever or chills. Call the clinic if your fever is higher than 101 F (or 39 C). Over the counter treatment: Tylenol 500 to '650mg'$  every four hours, Ibuprofen '800mg'$  (four over-the-counter pills) every six to eight hours. How to tell if the abortion is complete  Most women have bleeding and painful cramping. As you pass the pregnancy, the bleeding is usually heavy and the cramping very strong. This usually lasts one to four hours. Most women pass some blood clots in the toilet and the pregnancy is often one of those clots. After the pregnancy passes, the cramps decrease and the bleeding slows down significantly.  Within a few hours after passing the pregnancy, cramps and bleeding should be much improved

## 2022-09-08 NOTE — MAU Note (Signed)
Andrea Hayes is a 32 y.o. at Unknown here in MAU reporting: +HPT this afternoon.  Came in for confirmation.  Had spotting x2 lat wk, at first thought her period was starting, never did. No pain.  LMP: Jan, ? Unsure when  mid to late Onset of complaint: none Pain score: none Vitals:   09/08/22 1739  BP: 114/64  Pulse: 89  Resp: 16  Temp: 98.1 F (36.7 C)  SpO2: 99%      Lab orders placed from triage:  UPT

## 2022-09-08 NOTE — MAU Provider Note (Signed)
History     CSN: [id number]  Arrival date and time: 09/08/22 1648   None     Chief Complaint  Patient presents with   Possible Pregnancy   HPI Andrea Hayes is a 32 y.o. G2P1001 at 53w5dby LMP who presents to MAU for pregnancy confirmation. She reports she had a positive pregnancy test today. She had some vaginal bleeding last week but is just spotting currently. She denis passing clots or tissue. No vaginal itching, odor, urinary s/s or pain. LMP was possible sometime in January.    OB History     Gravida  2   Para  1   Term  1   Preterm      AB      Living  1      SAB      IAB      Ectopic      Multiple  0   Live Births  1           Past Medical History:  Diagnosis Date   DVT ([place]) ([place]    St[facility] ([facility]     Past Surgical History:  Procedure Laterality Date   BIOPSY  04/05/2021   Procedure: BIOPSY;  Surgeon: CDaryel November MD;  [facility];  Service: Gastroenterology;;   COLONOSCOPY N/A 04/05/2021   Procedure: COLONOSCOPY;  Surgeon: CDaryel November MD;  [facility];  Service: Gastroenterology;  Laterality: N/A;   ESOPHAGOGASTRODUODENOSCOPY N/A 04/05/2021   Procedure: ESOPHAGOGASTRODUODENOSCOPY (EGD);  Surgeon: CDaryel November MD;  [place]  Service: Gastroenterology;  Laterality: N/A;   LOWER EXTREMITY VENOGRAPHY Right 01/22/2022   Procedure: LOWER EXTREMITY VENOGRAPHY;  Surgeon: HCherre Robins MD;  [place] [facility];  Service: Cardiovascular;  Laterality: Right;   NO PAST SURGERIES     PERIPHERAL VASCULAR BALLOON ANGIOPLASTY  03/24/2021   Procedure: PERIPHERAL VASCULAR BALLOON ANGIOPLASTY;  Surgeon: HCherre Robins MD;  [place];  Service: Cardiovascular;;  bilateral common iliacs   PERIPHERAL VASCULAR THROMBECTOMY N/A 03/23/2021   Procedure: PERIPHERAL VASCULAR THROMBECTOMY;  Surgeon: CWaynetta Sandy MD;  [place] [facility];   Service: Cardiovascular;  Laterality: N/A;   PERIPHERAL VASCULAR THROMBECTOMY N/A 03/24/2021   Procedure: LYSIS RECHECK;  Surgeon: HCherre Robins MD;  [place] [facility];  Service: Cardiovascular;  Laterality: N/A;    Family History  Problem Relation Age of Onset   Asthma Father    Obesity Neg Hx    Hypertension Neg Hx    Diabetes Neg Hx    Cancer Neg Hx     Social History   Tobacco Use   Smoking status: Never   Smokeless tobacco: Never  Vaping Use   Vaping Use: Never used  Substance Use Topics   Alcohol use: No    Alcohol/week: 0.0 standard drinks of alcohol   Drug use: No    Allergies: No Known Allergies  Medications Prior to Admission  Medication Sig Dispense Refill Last Dose   acetaminophen (TYLENOL) 500 MG tablet Take 1,000 mg by mouth every 6 (six) hours as needed for mild pain.      acetaZOLAMIDE (DIAMOX) 125 MG tablet Take 2 tablets (250 mg total) by mouth 2 (two) times daily. (Patient not taking: Reported on 07/16/2022) 120 tablet 5    cetirizine (ZYRTEC) 10 MG tablet Take 10 mg by mouth daily as needed for allergies.      diclofenac Sodium (VOLTAREN) 1 %  GEL Apply 4 g topically 4 (four) times daily as needed for pain.      docusate sodium (COLACE) 100 MG capsule Take 1 capsule (100 mg total) by mouth at bedtime. (Patient taking differently: Take 100 mg by mouth daily as needed for mild constipation.) 10 capsule 0    enoxaparin (LOVENOX) 100 MG/ML injection Inject 1 mL (100 mg total) into the skin daily for 30 doses. 30 mL 0    metoprolol succinate (TOPROL-XL) 25 MG 24 hr tablet Take 1 tablet (25 mg total) by mouth daily. 30 tablet 1    neomycin-bacitracin-polymyxin [phone] OINT Apply 1 Application topically 2 (two) times daily. 15 g 0    polyethylene glycol (MIRALAX / GLYCOLAX) 17 g packet Take 17 g by mouth daily. (Patient taking differently: Take 17 g by mouth daily as needed for mild constipation.) 14 each 0    Prenatal Vit-Fe Fumarate-FA  (PRENATAL VITAMIN) 27-0.8 MG TABS Take 1 tablet by mouth daily. 30 tablet 12    tiZANidine (ZANAFLEX) 2 MG tablet Take 2 mg by mouth 2 (two) times daily as needed for muscle spasms.      Vitamin D, Ergocalciferol, (DRISDOL) 1.25 MG (50000 UNIT) CAPS capsule Take 50,000 Units by mouth every 7 (seven) days. Saturday      Review of Systems  Constitutional: Negative.   Gastrointestinal: Negative.   Genitourinary:  Positive for vaginal bleeding (spotting).  All other systems reviewed and are negative.  Physical Exam   Blood pressure 114/64, pulse 89, temperature 98.1 F (36.7 C), temperature source Oral, resp. rate 16, height '5\' 4"'$  (1.626 m), weight 67.1 kg, last menstrual period 07/16/2022, SpO2 99 %, not currently breastfeeding.  Physical Exam Vitals and nursing note reviewed.  Constitutional:      General: She is not in acute distress. Eyes:     Extraocular Movements: Extraocular movements intact.     Pupils: Pupils are equal, round, and reactive to light.  Cardiovascular:     Rate and Rhythm: Normal rate.  Pulmonary:     Effort: Pulmonary effort is normal. No respiratory distress.  Abdominal:     Palpations: Abdomen is soft.     Tenderness: There is no abdominal tenderness.  Genitourinary:    Comments: Patient self swabbed  Musculoskeletal:        General: Normal range of motion.     Cervical back: Normal range of motion.  Skin:    General: Skin is warm and dry.  Neurological:     General: No focal deficit present.     Mental Status: She is alert and oriented to person, place, and time.  Psychiatric:        Mood and Affect: Mood normal.        Behavior: Behavior normal.    Results for orders placed or performed during the hospital encounter of 09/08/22 (from the past 24 hour(s))  Pregnancy, urine POC     Status: Abnormal   Collection Time: 09/08/22  5:50 PM  Result Value Ref Range   Preg Test, Ur POSITIVE (A) NEGATIVE  Wet prep, genital     Status: Abnormal    Collection Time: 09/08/22  6:06 PM   Specimen: Vaginal  Result Value Ref Range   Yeast Wet Prep HPF POC NONE SEEN NONE SEEN   Trich, Wet Prep NONE SEEN NONE SEEN   Clue Cells Wet Prep HPF POC NONE SEEN NONE SEEN   WBC, Wet Prep HPF POC >=10 (A) <10   Sperm NONE SEEN  CBC     Status: Abnormal   Collection Time: 09/08/22  6:51 PM  Result Value Ref Range   WBC 5.0 4.0 - 10.5 K/uL   RBC 5.01 3.87 - 5.11 MIL/uL   Hemoglobin 12.2 12.0 - 15.0 g/dL   HCT 39.1 36.0 - 46.0 %   MCV 78.0 (L) 80.0 - 100.0 fL   MCH 24.4 (L) 26.0 - 34.0 pg   MCHC 31.2 30.0 - 36.0 g/dL   RDW 14.6 11.5 - 15.5 %   Platelets 344 150 - 400 K/uL   nRBC 0.0 0.0 - 0.2 %   US OB LESS THAN 14 WEEKS WITH OB TRANSVAGINAL  Addendum Date: 09/08/2022   ADDENDUM REPORT: 09/08/2022 20:05 ADDENDUM: Critical Value/emergent results were called by telephone at the time of interpretation on 09/08/2022 at 8:05 pm to provider [facility] , who verbally acknowledged these results. Electronically Signed   By: Lovey Newcomer M.D.   On: 09/08/2022 20:05   Result Date: 09/08/2022 CLINICAL DATA:  Pregnant patient with vaginal bleeding. EXAM: OBSTETRIC <14 WK [place] AND TRANSVAGINAL OB US TECHNIQUE: Both transabdominal and transvaginal ultrasound examinations were performed for complete evaluation of the gestation as well as the maternal uterus, adnexal regions, and pelvic cul-de-sac. Transvaginal technique was performed to assess early pregnancy. COMPARISON:  None Available. FINDINGS: Intrauterine gestational sac: Single Yolk sac:  Not Visualized. Embryo:  Visualized. Cardiac Activity: Not Visualized. Heart Rate: 0 bpm CRL:  14.7 mm   7 w   6 d Subchorionic hemorrhage:  None visualized. Maternal uterus/adnexae: Normal right and left ovaries. IMPRESSION: Intrauterine gestation with a crown-rump length of 14.7 mm and no fetal heartbeat identified. Findings meet definitive criteria for failed pregnancy. This follows SRU consensus guidelines: Diagnostic  Criteria for Nonviable Pregnancy Early in the First Trimester. Alison Stalling J Med [phone]. Electronically Signed: By: Lovey Newcomer M.D. On: 09/08/2022 19:39    MAU Course  Procedures  MDM UA CBC, HCG Wet prep, GC/CT [place]  CBC unremarkable. Blood type A positive, Rhogam not indicated. Wet prep negative, GC/CT pending. US shows SIUP without FHR present, definitive for failed pregnancy.   Discussed management of missed abortion: expectant management vs misoprostol vs D&E. Risks and benefits of all modalities discussed; all questions answered. Patient opted for misoprostol administration. Risks and benefits of medical management were carefully explained, including a success rate of 80-90%, the need for another person to be at home with her, and to call/come in if she had heavy bleeding, dizziness, or severe pain not relieved by medication.  Verbal consent was obtained.  Misoprostol 800 mcg with 1 refill & oxycodone were prescribed; written instructions given to patient. She will follow up in one week; if there has been no passage of pregnancy, will consider D&E. Bleeding precautions reviewed; she was told to call clinic or come to MAU for any concerns.  Assessment and Plan   1. [redacted] weeks gestation of pregnancy   2. Missed abortion    - Discharge home in stable condition - Rx's sent to pharmacy - F/u in 1 week at [facility]. Message sent to [facility] to schedule - Return to MAU for new/worsening symptoms. Strict return precautions given  Renee Harder, CNM 09/08/2022, 8:48 PM

## 2022-09-10 LAB — GC/CHLAMYDIA PROBE AMP (~~LOC~~) NOT AT ARMC
Chlamydia: NEGATIVE
Comment: NEGATIVE
Comment: NORMAL
Neisseria Gonorrhea: NEGATIVE

## 2022-09-18 ENCOUNTER — Ambulatory Visit: Payer: Medicaid Other | Admitting: Obstetrics and Gynecology

## 2022-09-18 ENCOUNTER — Encounter: Payer: Self-pay | Admitting: Obstetrics and Gynecology

## 2022-09-18 VITALS — BP 104/72 | HR 88 | Ht 63.0 in | Wt 153.0 lb

## 2022-09-18 DIAGNOSIS — Z3A01 Less than 8 weeks gestation of pregnancy: Secondary | ICD-10-CM | POA: Diagnosis not present

## 2022-09-18 DIAGNOSIS — O039 Complete or unspecified spontaneous abortion without complication: Secondary | ICD-10-CM

## 2022-09-18 NOTE — Progress Notes (Signed)
Pt presents for SAB f/u. Was given misoprostol to take to pass failed IUP 09/08/22. Pt is unsure if she has passed the pregnancy. Reports leaking of blood tinged liquid and sometimes brown blood but has not see tissue pass. Denies abdominal pain or cramping. Denies fever or feeling ill.

## 2022-09-18 NOTE — Progress Notes (Signed)
32 yo P1010 s/p misoprostol for the medical management of a missed abortion here for follow up. Patient reports light intermittent spotting over the past few days. She denies period-like flow. She states she had more diarrhea then vaginal bleeding. She denies any cramping pain or abnormal discharge. She is without any other complaints  Past Medical History:  Diagnosis Date   DVT (deep venous thrombosis) ([place])    Stroke [facility])    Past Surgical History:  Procedure Laterality Date   BIOPSY  04/05/2021   Procedure: BIOPSY;  Surgeon: Daryel November, MD;  [facility];  Service: Gastroenterology;;   COLONOSCOPY N/A 04/05/2021   Procedure: COLONOSCOPY;  Surgeon: Daryel November, MD;  Location: [place];  Service: Gastroenterology;  Laterality: N/A;   ESOPHAGOGASTRODUODENOSCOPY N/A 04/05/2021   Procedure: ESOPHAGOGASTRODUODENOSCOPY (EGD);  Surgeon: Daryel November, MD;  Location: [place];  Service: Gastroenterology;  Laterality: N/A;   LOWER EXTREMITY VENOGRAPHY Right 01/22/2022   Procedure: LOWER EXTREMITY VENOGRAPHY;  Surgeon: Cherre Robins, MD;  Location: [place] CV LAB;  Service: Cardiovascular;  Laterality: Right;   NO PAST SURGERIES     PERIPHERAL VASCULAR BALLOON ANGIOPLASTY  03/24/2021   Procedure: PERIPHERAL VASCULAR BALLOON ANGIOPLASTY;  Surgeon: Cherre Robins, MD;  Location: [place] CV LAB;  Service: Cardiovascular;;  bilateral common iliacs   PERIPHERAL VASCULAR THROMBECTOMY N/A 03/23/2021   Procedure: PERIPHERAL VASCULAR THROMBECTOMY;  Surgeon: Waynetta Sandy, MD;  Location: [place] CV LAB;  Service: Cardiovascular;  Laterality: N/A;   PERIPHERAL VASCULAR THROMBECTOMY N/A 03/24/2021   Procedure: LYSIS RECHECK;  Surgeon: Cherre Robins, MD;  Location: [place] CV LAB;  Service: Cardiovascular;  Laterality: N/A;   Family History  Problem Relation Age of Onset   Asthma Father    Obesity Neg Hx    Hypertension Neg Hx    Diabetes  Neg Hx    Cancer Neg Hx    Social History   Tobacco Use   Smoking status: Never   Smokeless tobacco: Never  Vaping Use   Vaping Use: Never used  Substance Use Topics   Alcohol use: No    Alcohol/week: 0.0 standard drinks of alcohol   Drug use: No   ROS See pertinent in HPI. All other systems reviewed and non contributory Blood pressure 104/72, pulse 88, height 5\' 3"  (1.6 m), weight 153 lb (69.4 kg), last menstrual period 07/16/2022, not currently breastfeeding. GENERAL: Well-developed, well-nourished female in no acute distress.  NEURO: alert and oriented x 3  A/P 32 yo with recent diagnosis of missed abortion - Will obtain pelvic ultrasound to rule out retained POC - Patient is interested in D&E if persistent tissue present - Patient desires to conceive again and was encouraged to continue taking prenatal vitamins

## 2022-09-21 ENCOUNTER — Ambulatory Visit (HOSPITAL_BASED_OUTPATIENT_CLINIC_OR_DEPARTMENT_OTHER)
Admission: RE | Admit: 2022-09-21 | Discharge: 2022-09-21 | Disposition: A | Discharge status: Home / Self Care | Payer: Medicaid Other | Source: Ambulatory Visit | Attending: Obstetrics and Gynecology | Admitting: Obstetrics and Gynecology

## 2022-09-21 DIAGNOSIS — O039 Complete or unspecified spontaneous abortion without complication: Secondary | ICD-10-CM | POA: Insufficient documentation

## 2022-09-22 ENCOUNTER — Inpatient Hospital Stay (HOSPITAL_COMMUNITY)
Admission: AD | Admit: 2022-09-22 | Discharge: 2022-09-22 | Disposition: A | Discharge status: Home / Self Care | Payer: Medicaid Other | Attending: Obstetrics and Gynecology | Admitting: Obstetrics and Gynecology

## 2022-09-22 ENCOUNTER — Other Ambulatory Visit: Payer: Self-pay

## 2022-09-22 DIAGNOSIS — O034 Incomplete spontaneous abortion without complication: Secondary | ICD-10-CM | POA: Insufficient documentation

## 2022-09-22 DIAGNOSIS — Z3A09 9 weeks gestation of pregnancy: Secondary | ICD-10-CM | POA: Diagnosis not present

## 2022-09-22 NOTE — MAU Note (Addendum)
Andrea Hayes is a 32 y.o. at [redacted]w[redacted]d here in MAU reporting: she wants to have surgery to remove the POC because she doesn't think the medication she was given to finish passing POC "are not working for me".  Denies pain, but endorses intermittent spotting. LMP: NA Onset of complaint: today Pain score: 0 Vitals:   09/22/22 0726  BP: 119/72  Pulse: 96  Resp: 20  Temp: 98.2 F (36.8 C)  SpO2: 100%     FHT:NA Lab orders placed from triage:  None

## 2022-09-22 NOTE — MAU Provider Note (Signed)
Event Date/Time   First Provider Initiated Contact with Patient 09/22/22 0745      S [name] is a 32 y.o. G2P1001 patient who presents to MAU today with complaint of "need to do a surgery." She reports she needs to remove "the old baby."  She states she has taken "the medicine, but it is not working for me."  She denies pain and has no current bleeding. She endorses having a visit with Dr. Mamie Nick. [name] 3/19 and an ultrasound yesterday.   O BP 119/72 (BP Location: Right Arm)   Pulse 96   Temp 98.2 F (36.8 C) (Oral)   Resp 20   Wt 71 kg   LMP 07/16/2022 (Approximate)   SpO2 100%   BMI 27.74 kg/m   Physical Exam Vitals reviewed.  Constitutional:      General: She is not in acute distress.    Appearance: Normal appearance. She is not ill-appearing or toxic-appearing.  HENT:     Head: Normocephalic and atraumatic.  Eyes:     Conjunctiva/sclera: Conjunctivae normal.  Pulmonary:     Effort: Pulmonary effort is normal. No respiratory distress.  Musculoskeletal:        General: Normal range of motion.     Cervical back: Normal range of motion.  Neurological:     Mental Status: She is alert and oriented to person, place, and time.  Psychiatric:        Mood and Affect: Mood normal.        Behavior: Behavior normal.    US PELVIC COMPLETE WITH TRANSVAGINAL  Result Date: 09/21/2022 CLINICAL DATA:  rule out retained POC status post recent spontaneous abortion. EXAM: ULTRASOUND OF PELVIS TECHNIQUE: Transabdominal and transvaginalultrasound examination of the pelvis was performed including evaluation of the uterus, ovaries, adnexal regions, and pelvic cul-de-sac. COMPARISON:  09/08/2022. FINDINGS: Uterusanteverted, 12 x 8 x 6 cm. Fluid is present in the uterine cavity in the endometrium appears somewhat thickened with the appearance suggestive of trophoblastic tissue. This could represent retained products and correlation with HCG measurements is recommended. Tissue diagnosis may  be necessary. Right ovary 4.4 x 3.1 x 2.9 cm with a 2.4 cm corpus luteum. Left ovary Unremarkable, 4.1 x 3.7 x 2.4 cm. Images of the adnexae demonstrated no masses or fluid collections. IMPRESSION: Fluid in the uterine cavity and suggestion of residual trophoblastic tissue. Retained products of conception not excluded. Correlation with HCG measurements recommended. Tissue diagnosis may be necessary. Electronically Signed   By: Sammie Bench M.D.   On: 09/21/2022 12:00     A Medical screening exam complete Retained Products s/p MAB  P -Patient informed that she will not be receiving a DNC today. -However will send message for it to be scheduled accordingly with request for this week if possible. Message sent to [facility]. [facility].  -Precautions reviewed.  Gavin Pound, CNM 09/22/2022 7:45 AM

## 2022-09-22 NOTE — Progress Notes (Signed)
Milinda Cave, CNM notified regarding pt arrival and status.

## 2022-09-24 ENCOUNTER — Encounter (HOSPITAL_COMMUNITY): Payer: Self-pay | Admitting: Obstetrics & Gynecology

## 2022-09-24 ENCOUNTER — Telehealth: Payer: Self-pay

## 2022-09-24 ENCOUNTER — Other Ambulatory Visit: Payer: Self-pay

## 2022-09-24 ENCOUNTER — Other Ambulatory Visit: Payer: Self-pay | Admitting: Obstetrics & Gynecology

## 2022-09-24 DIAGNOSIS — O034 Incomplete spontaneous abortion without complication: Secondary | ICD-10-CM

## 2022-09-24 NOTE — Telephone Encounter (Signed)
Called patient to notify of surgery date, time, location & pre-op instructions, no answer, unable to leave voicemail, phone rings and eventually you get a prompt to hang up and try your call again later.

## 2022-09-24 NOTE — Anesthesia Preprocedure Evaluation (Signed)
Anesthesia Evaluation  Patient identified by MRN, date of birth, ID band Patient awake    Reviewed: Allergy & Precautions, NPO status , Patient's Chart, lab work & pertinent test results  History of Anesthesia Complications Negative for: history of anesthetic complications  Airway Mallampati: III  TM Distance: >3 FB Neck ROM: Full    Dental  (+) Dental Advisory Given   Pulmonary neg shortness of breath, neg sleep apnea, neg COPD, neg recent URI, PE   Pulmonary exam normal breath sounds clear to auscultation       Cardiovascular hypertension, (-) angina + DVT  (-) Past MI, (-) Cardiac Stents and (-) CABG  Rhythm:Regular Rate:Normal     Neuro/Psych  Headaches Idiopathic intracranial hypertension CVA (in chart, patient denies)    GI/Hepatic negative GI ROS, Neg liver ROS,,,  Endo/Other  negative endocrine ROS    Renal/GU negative Renal ROS     Musculoskeletal   Abdominal   Peds  Hematology  (+) Blood dyscrasia, anemia   Anesthesia Other Findings   Reproductive/Obstetrics                             Anesthesia Physical Anesthesia Plan  ASA: 3  Anesthesia Plan: MAC   Post-op Pain Management: Tylenol PO (pre-op)*   Induction: Intravenous  PONV Risk Score and Plan: 2 and Ondansetron, Dexamethasone and Treatment may vary due to age or medical condition  Airway Management Planned: Natural Airway and Simple Face Mask  Additional Equipment:   Intra-op Plan:   Post-operative Plan:   Informed Consent: I have reviewed the patients History and Physical, chart, labs and discussed the procedure including the risks, benefits and alternatives for the proposed anesthesia with the patient or authorized representative who has indicated his/her understanding and acceptance.     Dental advisory given  Plan Discussed with: CRNA and Anesthesiologist  Anesthesia Plan Comments: (Discussed with  patient risks of MAC including, but not limited to, minor pain or discomfort, hearing people in the room, and possible need for backup general anesthesia. Risks for general anesthesia also discussed including, but not limited to, sore throat, hoarse voice, chipped/damaged teeth, injury to vocal cords, nausea and vomiting, allergic reactions, lung infection, heart attack, stroke, and death. All questions answered. )       Anesthesia Quick Evaluation

## 2022-09-24 NOTE — Progress Notes (Signed)
Spoke with pt for pre-op call. Pt denies cardiac history. Pt has had a DVT in left leg in the past.  Pt states she had allergy symptoms 2 weeks ago, but states no longer has any symptoms. She denied fever.   Shower instructions given to pt .

## 2022-09-25 ENCOUNTER — Ambulatory Visit (HOSPITAL_COMMUNITY): Payer: Medicaid Other | Admitting: Anesthesiology

## 2022-09-25 ENCOUNTER — Ambulatory Visit (HOSPITAL_BASED_OUTPATIENT_CLINIC_OR_DEPARTMENT_OTHER): Payer: Medicaid Other | Admitting: Anesthesiology

## 2022-09-25 ENCOUNTER — Encounter (HOSPITAL_COMMUNITY): Payer: Self-pay | Admitting: Obstetrics & Gynecology

## 2022-09-25 ENCOUNTER — Encounter (HOSPITAL_COMMUNITY)
Admission: RE | Disposition: A | Discharge status: Home / Self Care | Payer: Self-pay | Source: Home / Self Care | Attending: Obstetrics & Gynecology

## 2022-09-25 ENCOUNTER — Ambulatory Visit (HOSPITAL_BASED_OUTPATIENT_CLINIC_OR_DEPARTMENT_OTHER): Payer: Medicaid Other | Admitting: Family Medicine

## 2022-09-25 ENCOUNTER — Ambulatory Visit (HOSPITAL_COMMUNITY)
Admission: RE | Admit: 2022-09-25 | Discharge: 2022-09-25 | Disposition: A | Discharge status: Home / Self Care | Payer: Medicaid Other | Attending: Obstetrics & Gynecology | Admitting: Obstetrics & Gynecology

## 2022-09-25 DIAGNOSIS — O161 Unspecified maternal hypertension, first trimester: Secondary | ICD-10-CM | POA: Diagnosis not present

## 2022-09-25 DIAGNOSIS — D649 Anemia, unspecified: Secondary | ICD-10-CM | POA: Diagnosis not present

## 2022-09-25 DIAGNOSIS — Z3A01 Less than 8 weeks gestation of pregnancy: Secondary | ICD-10-CM

## 2022-09-25 DIAGNOSIS — O034 Incomplete spontaneous abortion without complication: Secondary | ICD-10-CM

## 2022-09-25 DIAGNOSIS — O99011 Anemia complicating pregnancy, first trimester: Secondary | ICD-10-CM | POA: Diagnosis not present

## 2022-09-25 DIAGNOSIS — O021 Missed abortion: Secondary | ICD-10-CM | POA: Diagnosis not present

## 2022-09-25 DIAGNOSIS — Z86718 Personal history of other venous thrombosis and embolism: Secondary | ICD-10-CM | POA: Insufficient documentation

## 2022-09-25 HISTORY — PX: DILATION AND EVACUATION: SHX1459

## 2022-09-25 SURGERY — DILATION AND EVACUATION, UTERUS
Anesthesia: Monitor Anesthesia Care

## 2022-09-25 MED ORDER — LACTATED RINGERS IV SOLN: INTRAVENOUS | Status: DC

## 2022-09-25 MED ORDER — LIDOCAINE 2% (20 MG/ML) 5 ML SYRINGE
INTRAMUSCULAR | Status: DC | PRN
  Administered 2022-09-25: 40 mg via INTRAVENOUS

## 2022-09-25 MED ORDER — OXYCODONE HCL 5 MG PO TABS
5.0000 mg | ORAL_TABLET | Freq: Once | ORAL | Status: AC | PRN
  Administered 2022-09-25: 5 mg via ORAL

## 2022-09-25 MED ORDER — DOXYCYCLINE HYCLATE 100 MG IV SOLR
200.0000 mg | INTRAVENOUS | Status: AC
  Administered 2022-09-25: 200 mg via INTRAVENOUS
  Filled 2022-09-25: qty 200

## 2022-09-25 MED ORDER — CHLORHEXIDINE GLUCONATE 0.12 % MT SOLN
15.0000 mL | OROMUCOSAL | Status: AC
  Administered 2022-09-25: 15 mL via OROMUCOSAL
  Filled 2022-09-25 (×2): qty 15

## 2022-09-25 MED ORDER — OXYCODONE HCL 5 MG PO TABS
ORAL_TABLET | ORAL | Status: AC
  Filled 2022-09-25: qty 1

## 2022-09-25 MED ORDER — PROPOFOL 500 MG/50ML IV EMUL
INTRAVENOUS | Status: AC
  Filled 2022-09-25: qty 200

## 2022-09-25 MED ORDER — KETOROLAC TROMETHAMINE 30 MG/ML IJ SOLN
INTRAMUSCULAR | Status: AC
  Filled 2022-09-25: qty 1

## 2022-09-25 MED ORDER — BUPIVACAINE HCL (PF) 0.5 % IJ SOLN
INTRAMUSCULAR | Status: DC | PRN
  Administered 2022-09-25: 10 mL

## 2022-09-25 MED ORDER — LIDOCAINE 2% (20 MG/ML) 5 ML SYRINGE
INTRAMUSCULAR | Status: AC
  Filled 2022-09-25: qty 5

## 2022-09-25 MED ORDER — PROPOFOL 1000 MG/100ML IV EMUL
INTRAVENOUS | Status: AC
  Filled 2022-09-25: qty 100

## 2022-09-25 MED ORDER — MIDAZOLAM HCL 2 MG/2ML IJ SOLN
INTRAMUSCULAR | Status: AC
  Filled 2022-09-25: qty 2

## 2022-09-25 MED ORDER — ONDANSETRON HCL 4 MG/2ML IJ SOLN
INTRAMUSCULAR | Status: AC
  Filled 2022-09-25: qty 2

## 2022-09-25 MED ORDER — PROPOFOL 10 MG/ML IV BOLUS
INTRAVENOUS | Status: DC | PRN
  Administered 2022-09-25: 70 mg via INTRAVENOUS
  Administered 2022-09-25: 150 ug/kg/min via INTRAVENOUS
  Administered 2022-09-25: 30 mg via INTRAVENOUS

## 2022-09-25 MED ORDER — KETOROLAC TROMETHAMINE 30 MG/ML IJ SOLN: 30.0000 mg | Freq: Once | INTRAMUSCULAR | Status: DC | PRN

## 2022-09-25 MED ORDER — PROMETHAZINE HCL 25 MG/ML IJ SOLN: 6.2500 mg | INTRAMUSCULAR | Status: DC | PRN

## 2022-09-25 MED ORDER — KETOROLAC TROMETHAMINE 30 MG/ML IJ SOLN
INTRAMUSCULAR | Status: DC | PRN
  Administered 2022-09-25: 30 mg via INTRAVENOUS

## 2022-09-25 MED ORDER — ONDANSETRON HCL 4 MG/2ML IJ SOLN
INTRAMUSCULAR | Status: DC | PRN
  Administered 2022-09-25: 4 mg via INTRAVENOUS

## 2022-09-25 MED ORDER — MIDAZOLAM HCL 5 MG/5ML IJ SOLN
INTRAMUSCULAR | Status: DC | PRN
  Administered 2022-09-25: 2 mg via INTRAVENOUS

## 2022-09-25 MED ORDER — FENTANYL CITRATE (PF) 100 MCG/2ML IJ SOLN: 25.0000 ug | INTRAMUSCULAR | Status: DC | PRN

## 2022-09-25 MED ORDER — OXYCODONE-ACETAMINOPHEN 5-325 MG PO TABS: 1.0000 | ORAL_TABLET | ORAL | 0 refills | Status: DC | PRN

## 2022-09-25 MED ORDER — BUPIVACAINE HCL (PF) 0.5 % IJ SOLN
INTRAMUSCULAR | Status: AC
  Filled 2022-09-25: qty 30

## 2022-09-25 MED ORDER — ACETAMINOPHEN 500 MG PO TABS
1000.0000 mg | ORAL_TABLET | Freq: Once | ORAL | Status: AC
  Administered 2022-09-25: 1000 mg via ORAL
  Filled 2022-09-25: qty 2

## 2022-09-25 MED ORDER — OXYCODONE HCL 5 MG/5ML PO SOLN: 5.0000 mg | Freq: Once | ORAL | Status: AC | PRN

## 2022-09-25 MED ORDER — PROPOFOL 10 MG/ML IV BOLUS
INTRAVENOUS | Status: AC
  Filled 2022-09-25: qty 20

## 2022-09-25 MED ORDER — POVIDONE-IODINE 10 % EX SWAB: 2.0000 | Freq: Once | CUTANEOUS | Status: DC

## 2022-09-25 SURGICAL SUPPLY — 20 items
CATH ROBINSON RED A/P 16FR (CATHETERS) ×1 IMPLANT
FILTER UTR ASPR ASSEMBLY (MISCELLANEOUS) ×1 IMPLANT
GLOVE BIO SURGEON STRL SZ 6.5 (GLOVE) ×1 IMPLANT
GLOVE BIOGEL PI IND STRL 7.0 (GLOVE) ×2 IMPLANT
GOWN STRL REUS W/ TWL LRG LVL3 (GOWN DISPOSABLE) ×2 IMPLANT
GOWN STRL REUS W/TWL LRG LVL3 (GOWN DISPOSABLE) ×2
HOSE CONNECTING 18IN BERKELEY (TUBING) ×1 IMPLANT
KIT BERKELEY 1ST TRI 3/8 NO TR (MISCELLANEOUS) ×1 IMPLANT
KIT BERKELEY 1ST TRIMESTER 3/8 (MISCELLANEOUS) ×1 IMPLANT
NS IRRIG 1000ML POUR BTL (IV SOLUTION) ×1 IMPLANT
PACK VAGINAL MINOR WOMEN LF (CUSTOM PROCEDURE TRAY) ×1 IMPLANT
PAD OB MATERNITY 4.3X12.25 (PERSONAL CARE ITEMS) ×1 IMPLANT
SET BERKELEY SUCTION TUBING (SUCTIONS) IMPLANT
SPIKE FLUID TRANSFER (MISCELLANEOUS) IMPLANT
TOWEL GREEN STERILE FF (TOWEL DISPOSABLE) ×2 IMPLANT
UNDERPAD 30X36 HEAVY ABSORB (UNDERPADS AND DIAPERS) ×1 IMPLANT
VACURETTE 10 RIGID CVD (CANNULA) IMPLANT
VACURETTE 7MM CVD STRL WRAP (CANNULA) IMPLANT
VACURETTE 8 RIGID CVD (CANNULA) IMPLANT
VACURETTE 9 RIGID CVD (CANNULA) IMPLANT

## 2022-09-25 NOTE — H&P (Signed)
Andrea Hayes is an 32 y.o. female. [id number] Patient is scheduled for D&C today due to retained POC after receiving Cytotec 3/9 for fetal demise [redacted] weeks gestation. Some bleeding no passage of tissue  Pertinent Gynecological History: Last pap: normal Date: 05/2020    Menstrual History:  Patient's last menstrual period was 07/16/2022 (approximate).    Past Medical History:  Diagnosis Date   DVT (deep venous thrombosis) ([place])    Stroke [facility])    pt denies    Past Surgical History:  Procedure Laterality Date   BIOPSY  04/05/2021   Procedure: BIOPSY;  Surgeon: Daryel November, MD;  Location: [facility] ENDOSCOPY;  Service: Gastroenterology;;   COLONOSCOPY N/A 04/05/2021   Procedure: COLONOSCOPY;  Surgeon: Daryel November, MD;  Location: [place];  Service: Gastroenterology;  Laterality: N/A;   ESOPHAGOGASTRODUODENOSCOPY N/A 04/05/2021   Procedure: ESOPHAGOGASTRODUODENOSCOPY (EGD);  Surgeon: Daryel November, MD;  Location: [place];  Service: Gastroenterology;  Laterality: N/A;   LOWER EXTREMITY VENOGRAPHY Right 01/22/2022   Procedure: LOWER EXTREMITY VENOGRAPHY;  Surgeon: Cherre Robins, MD;  Location: [place] CV LAB;  Service: Cardiovascular;  Laterality: Right;   PERIPHERAL VASCULAR BALLOON ANGIOPLASTY  03/24/2021   Procedure: PERIPHERAL VASCULAR BALLOON ANGIOPLASTY;  Surgeon: Cherre Robins, MD;  Location: [place] CV LAB;  Service: Cardiovascular;;  bilateral common iliacs   PERIPHERAL VASCULAR THROMBECTOMY N/A 03/23/2021   Procedure: PERIPHERAL VASCULAR THROMBECTOMY;  Surgeon: Waynetta Sandy, MD;  Location: [place] CV LAB;  Service: Cardiovascular;  Laterality: N/A;   PERIPHERAL VASCULAR THROMBECTOMY N/A 03/24/2021   Procedure: LYSIS RECHECK;  Surgeon: Cherre Robins, MD;  Location: [place] CV LAB;  Service: Cardiovascular;  Laterality: N/A;    Family History  Problem Relation Age of Onset   Asthma Father    Obesity Neg Hx     Hypertension Neg Hx    Diabetes Neg Hx    Cancer Neg Hx     Social History:  reports that she has never smoked. She has never been exposed to tobacco smoke. She has never used smokeless tobacco. She reports that she does not drink alcohol and does not use drugs.  Allergies: No Known Allergies  Medications Prior to Admission  Medication Sig Dispense Refill Last Dose   acetaminophen (TYLENOL) 500 MG tablet Take 1,000 mg by mouth every 6 (six) hours as needed for mild pain.   Past Week   Vitamin D, Ergocalciferol, (DRISDOL) 1.25 MG (50000 UNIT) CAPS capsule Take 50,000 Units by mouth every 7 (seven) days. Saturdays OR Sundays   Past Week   docusate sodium (COLACE) 100 MG capsule Take 1 capsule (100 mg total) by mouth at bedtime. (Patient not taking: Reported on 09/24/2022) 10 capsule 0 Not Taking   oxyCODONE-acetaminophen (PERCOCET/ROXICET) 5-325 MG tablet Take 1 tablet by mouth every 6 (six) hours as needed for severe pain. (Patient not taking: Reported on 09/24/2022) 15 tablet 0 Not Taking   polyethylene glycol (MIRALAX / GLYCOLAX) 17 g packet Take 17 g by mouth daily. (Patient not taking: Reported on 09/24/2022) 14 each 0 Not Taking   Prenatal Vit-Fe Fumarate-FA (PRENATAL VITAMIN) 27-0.8 MG TABS Take 1 tablet by mouth daily. (Patient not taking: Reported on 09/24/2022) 30 tablet 12 Not Taking   promethazine (PHENERGAN) 25 MG tablet Take 1 tablet (25 mg total) by mouth every 6 (six) hours as needed for nausea or vomiting. (Patient not taking: Reported on 09/24/2022) 30 tablet 0 Not Taking    Review of Systems  Constitutional:  Negative.   Respiratory: Negative.    Cardiovascular: Negative.   Gastrointestinal: Negative.   Genitourinary:  Positive for vaginal bleeding. Negative for pelvic pain.    Blood pressure 110/76, pulse 85, temperature 98 F (36.7 C), temperature source Oral, resp. rate 20, height 5\' 4"  (1.626 m), weight 71 kg, last menstrual period 07/16/2022, SpO2 100 %, not currently  breastfeeding. Physical Exam Vitals and nursing note reviewed. Exam conducted with a chaperone present.  Constitutional:      Appearance: Normal appearance.  HENT:     Head: Normocephalic and atraumatic.  Cardiovascular:     Rate and Rhythm: Normal rate.  Pulmonary:     Effort: Pulmonary effort is normal.  Abdominal:     General: Abdomen is flat.  Musculoskeletal:        General: Normal range of motion.     Cervical back: Normal range of motion.  Skin:    General: Skin is warm and dry.  Neurological:     Mental Status: She is alert.  Psychiatric:        Mood and Affect: Mood normal.        Behavior: Behavior normal.     Narrative & Impression  CLINICAL DATA:  rule out retained POC status post recent spontaneous abortion.   EXAM: ULTRASOUND OF PELVIS   TECHNIQUE: Transabdominal and transvaginalultrasound examination of the pelvis was performed including evaluation of the uterus, ovaries, adnexal regions, and pelvic cul-de-sac.   COMPARISON:  09/08/2022.   FINDINGS: Uterusanteverted, 12 x 8 x 6 cm. Fluid is present in the uterine cavity in the endometrium appears somewhat thickened with the appearance suggestive of trophoblastic tissue. This could represent retained products and correlation with HCG measurements is recommended. Tissue diagnosis may be necessary.   Right ovary   4.4 x 3.1 x 2.9 cm with a 2.4 cm corpus luteum.   Left ovary   Unremarkable, 4.1 x 3.7 x 2.4 cm.   Images of the adnexae demonstrated no masses or fluid collections.   IMPRESSION: Fluid in the uterine cavity and suggestion of residual trophoblastic tissue. Retained products of conception not excluded. Correlation with HCG measurements recommended. Tissue diagnosis may be necessary.     Electronically Signed   By: Sammie Bench M.D.   On: 09/21/2022 12:00      Assessment/Plan: Retained POC, incomplete abortion. Patient desires surgical management with D&C.  The risks of  surgery were discussed in detail with the patient including but not limited to: bleeding which may require transfusion or reoperation; infection which may require prolonged hospitalization or re-hospitalization and antibiotic therapy; injury to bowel, bladder, ureters and major vessels or other surrounding organs which may lead to other procedures; formation of adhesions; need for additional procedures including laparotomy or subsequent procedures secondary to intraoperative injury or abnormal pathology; thromboembolic phenomenon; incisional problems and other postoperative or anesthesia complications.  Patient was told that the likelihood that her condition and symptoms will be treated effectively with this surgical management was very high; the postoperative expectations were also discussed in detail. The patient also understands the alternative treatment options which were discussed in full. All questions were answered.    Emeterio Reeve 09/25/2022, 10:34 AM

## 2022-09-25 NOTE — Transfer of Care (Signed)
Immediate Anesthesia Transfer of Care Note  Patient: Andrea Hayes  Procedure(s) Performed: DILATATION AND EVACUATION  Patient Location: PACU  Anesthesia Type:MAC  Level of Consciousness: awake, drowsy, and patient cooperative  Airway & Oxygen Therapy: Patient Spontanous Breathing and Patient connected to face mask oxygen  Post-op Assessment: Report given to RN, Post -op Vital signs reviewed and stable, and Patient moving all extremities X 4  Post vital signs: Reviewed and stable  Last Vitals:  Vitals Value Taken Time  BP 108/76 09/25/22 1338  Temp    Pulse 92 09/25/22 1340  Resp 18 09/25/22 1340  SpO2 100 % 09/25/22 1340  Vitals shown include unvalidated device data.  Last Pain:  Vitals:   09/25/22 1033  TempSrc:   PainSc: 0-No pain      Patients Stated Pain Goal: 0 ([id number])  Complications: No notable events documented.

## 2022-09-25 NOTE — Anesthesia Procedure Notes (Signed)
Procedure Name: MAC Date/Time: 09/25/2022 12:58 PM  Performed by: Maude Leriche, CRNAPre-anesthesia Checklist: Patient identified, Emergency Drugs available, Suction available, Patient being monitored and Timeout performed Patient Re-evaluated:Patient Re-evaluated prior to induction Oxygen Delivery Method: Simple face mask Placement Confirmation: positive ETCO2 Dental Injury: Teeth and Oropharynx as per pre-operative assessment

## 2022-09-25 NOTE — Op Note (Signed)
Andrea Hayes PROCEDURE DATE: 09/25/2022  PREOPERATIVE DIAGNOSIS: 7 week incomplete abortion POSTOPERATIVE DIAGNOSIS: The same PROCEDURE:     Dilation and Evacuation SURGEON:  Emeterio Reeve MD  INDICATIONS: 32 y.o. G2P1001 with MAB at [redacted] weeks gestation, needing surgical completion.  Risks of surgery were discussed with the patient including but not limited to: bleeding which may require transfusion; infection which may require antibiotics; injury to uterus or surrounding organs; need for additional procedures including laparotomy or laparoscopy; possibility of intrauterine scarring which may impair future fertility; and other postoperative/anesthesia complications. Written informed consent was obtained.    FINDINGS:  A 8 week size uterus, moderate amounts of products of conception, specimen sent to pathology.  ANESTHESIA:    Monitored intravenous sedation, paracervical block. INTRAVENOUS FLUIDS:  500 ml of LR ESTIMATED BLOOD LOSS:  Less than 50 ml. SPECIMENS:  Products of conception sent to pathology COMPLICATIONS:  None immediate.  PROCEDURE DETAILS:  The patient received intravenous Doxycycline while in the preoperative area.  She was then taken to the operating room where monitored intravenous sedation was administered and was found to be adequate.  After an adequate timeout was performed, she was placed in the dorsal lithotomy position and examined; then prepped and draped in the sterile manner.   Her bladder was catheterized for an unmeasured amount of clear, yellow urine. A vaginal speculum was then placed in the patient's vagina and a single tooth tenaculum was applied to the anterior lip of the cervix.  A paracervical block using 10 ml of 0.5% Marcaine was administered. The cervix was gently dilated to accommodate a 9 mm suction curette that was gently advanced to the uterine fundus.  The suction device was then activated and curette slowly rotated to clear the uterus of products of  conception.  A sharp curettage was then performed to confirm complete emptying of the uterus. There was minimal bleeding noted and the tenaculum removed with good hemostasis noted.   All instruments were removed from the patient's vagina.  Sponge and instrument counts were correct times two  The patient tolerated the procedure well and was taken to the recovery area awake, and in stable condition.  Woodroe Mode, MD 09/25/2022 1:32 PM

## 2022-09-25 NOTE — Op Note (Deleted)
.  Andrea Hayes

## 2022-09-25 NOTE — Anesthesia Postprocedure Evaluation (Signed)
Anesthesia Post Note  Patient: Andrea Hayes  Procedure(s) Performed: DILATATION AND EVACUATION     Patient location during evaluation: PACU Anesthesia Type: MAC Level of consciousness: awake and alert Pain management: pain level controlled Vital Signs Assessment: post-procedure vital signs reviewed and stable Respiratory status: spontaneous breathing, nonlabored ventilation and respiratory function stable Cardiovascular status: stable and blood pressure returned to baseline Anesthetic complications: no   No notable events documented.  Last Vitals:  Vitals:   09/25/22 1345 09/25/22 1445  BP: 118/83 (!) 116/92  Pulse: 93 76  Resp: 20 14  Temp:  36.4 C  SpO2: 98% 100%    Last Pain:  Vitals:   09/25/22 1338  TempSrc:   PainSc: 0-No pain                 Audry Pili

## 2022-09-26 ENCOUNTER — Encounter (HOSPITAL_COMMUNITY): Payer: Self-pay | Admitting: Obstetrics & Gynecology

## 2022-09-27 LAB — SURGICAL PATHOLOGY

## 2022-10-01 ENCOUNTER — Telehealth: Payer: Self-pay | Admitting: Nurse Practitioner

## 2022-10-01 NOTE — Telephone Encounter (Signed)
Patient called to reschedule appointment.patient aware of date and time of moved appointment.

## 2022-10-03 ENCOUNTER — Encounter: Payer: Self-pay | Admitting: Obstetrics and Gynecology

## 2022-10-03 ENCOUNTER — Other Ambulatory Visit: Payer: Medicaid Other | Admitting: Obstetrics and Gynecology

## 2022-10-03 NOTE — Patient Outreach (Signed)
Care Coordination  10/03/2022  Dessie Schlup [birth year] [id number]  RNCM returned patient's phone call.  Patient has follow up appointments scheduled.  Aida Raider RN, BSN [place]  [facility] - Managed Medicaid High Risk [phone]

## 2022-10-03 NOTE — Patient Instructions (Signed)
Visit Information  Ms. Lisvet Kaczmarek  - as a part of your Medicaid benefit, you are eligible for care management and care coordination services at no cost or copay. I was unable to reach you by phone today but would be happy to help you with your health related needs. Please feel free to call me at [phone]  A member of the Managed Medicaid care management team will reach out to you again over the next 30 business  days.   Conchita Truxillo RN, BSN [place]  [facility] Care Management Coordinator - Managed Medicaid High Risk [phone]   

## 2022-10-03 NOTE — Patient Outreach (Signed)
  Medicaid Managed Care   Unsuccessful Attempt Note   10/03/2022 Name: Andrea Hayes MRN: [id number] DOB: [birth year]  Referred by: [name], MD Reason for referral : High Risk Managed Medicaid (Unsuccessful telephone outreach)  A second unsuccessful telephone outreach was attempted today. The patient was referred to the case management team for assistance with care management and care coordination.    Follow Up Plan: The Managed Medicaid care management team will reach out to the patient again over the next 30 business  days. and The  Patient has been provided with contact information for the Managed Medicaid care management team and has been advised to call with any health related questions or concerns.   Aida Raider RN, BSN [place]  [facility] - Managed Medicaid High Risk [phone]

## 2022-10-10 ENCOUNTER — Inpatient Hospital Stay: Payer: Medicaid Other | Admitting: Nurse Practitioner

## 2022-10-10 ENCOUNTER — Inpatient Hospital Stay: Payer: Medicaid Other

## 2022-10-15 NOTE — Progress Notes (Unsigned)
Patient Care Team: Fleet Contras, MD as PCP - General (Internal Medicine) Drema Dallas, DO as Consulting Physician (Neurology) Leonie Douglas, MD as Consulting Physician (Vascular Surgery) Malachy Mood, MD as Consulting Physician (Hematology) Craft, Calvert Cantor, RN as Case Manager Shaune Leeks as Social Worker   CHIEF COMPLAINT: Follow-up recurrent DVT/PE  CURRENT THERAPY: Previously on coumadin 10 mg po daily, switched to lovenox daily 05/03/22 for family planning/wanting to get pregnant   INTERVAL HISTORY Ms Shanker returns for follow-up, last seen by me 06/27/2022. She had positive home pregnancy test in March, came in to [facility]U and confirmed [redacted] weeks gestation but no fetal heart beat on [place]. She was given misoprostol but did not pass all products of conception, underwent D&E 09/25/22.   Today she presents by herself, feeling okay in general.  She is still having vaginal spotting but no pain.  For approximately 1 month she has new right leg swelling and pain after ambulation.  About 2 weeks ago she developed a new dry cough and shortness of breath.  She tells me she stopped Lovenox shortly after her last visit in December 2023, she grew afraid of the needles. She doesn't want to take oral AC either.  ROS  All other systems reviewed and negative   Past Medical History:  Diagnosis Date   DVT (deep venous thrombosis)      Past Surgical History:  Procedure Laterality Date   BIOPSY  04/05/2021   Procedure: BIOPSY;  Surgeon: Jenel Lucks, MD;  Location: [facility] ENDOSCOPY;  Service: Gastroenterology;;   COLONOSCOPY N/A 04/05/2021   Procedure: COLONOSCOPY;  Surgeon: Jenel Lucks, MD;  Location: [facility] ENDOSCOPY;  Service: Gastroenterology;  Laterality: N/A;   DILATION AND EVACUATION N/A 09/25/2022   Procedure: DILATATION AND EVACUATION;  Surgeon: Adam Phenix, MD;  Location: [facility] OR;  Service: Gynecology;  Laterality: N/A;   ESOPHAGOGASTRODUODENOSCOPY N/A 04/05/2021   Procedure:  ESOPHAGOGASTRODUODENOSCOPY (EGD);  Surgeon: Jenel Lucks, MD;  Location: [facility] ENDOSCOPY;  Service: Gastroenterology;  Laterality: N/A;   LOWER EXTREMITY VENOGRAPHY Right 01/22/2022   Procedure: LOWER EXTREMITY VENOGRAPHY;  Surgeon: Leonie Douglas, MD;  Location: [facility] INVASIVE CV LAB;  Service: Cardiovascular;  Laterality: Right;   PERIPHERAL VASCULAR BALLOON ANGIOPLASTY  03/24/2021   Procedure: PERIPHERAL VASCULAR BALLOON ANGIOPLASTY;  Surgeon: Leonie Douglas, MD;  Location: [facility] INVASIVE CV LAB;  Service: Cardiovascular;;  bilateral common iliacs   PERIPHERAL VASCULAR THROMBECTOMY N/A 03/23/2021   Procedure: PERIPHERAL VASCULAR THROMBECTOMY;  Surgeon: Maeola Harman, MD;  Location: [facility] INVASIVE CV LAB;  Service: Cardiovascular;  Laterality: N/A;   PERIPHERAL VASCULAR THROMBECTOMY N/A 03/24/2021   Procedure: LYSIS RECHECK;  Surgeon: Leonie Douglas, MD;  Location: [facility] INVASIVE CV LAB;  Service: Cardiovascular;  Laterality: N/A;     Outpatient Encounter Medications as of 10/16/2022  Medication Sig   oxyCODONE-acetaminophen (PERCOCET/ROXICET) 5-325 MG tablet Take 1 tablet by mouth every 4 (four) hours as needed for severe pain.   No facility-administered encounter medications on file as of 10/16/2022.     Today's Vitals   10/16/22 1000 10/16/22 1013  BP:  119/82  Pulse:  85  Resp:  18  Temp:  98.2 F (36.8 C)  TempSrc:  Oral  SpO2:  100%  Weight:  152 lb 3.2 oz (69 kg)  Height:   (1.626 m)  PainSc: 10-Worst pain ever    HR 95 - 102 on ambulation O2 96 - 98% on 6 minute walk test  Body mass index is 26.13 kg/m.   PHYSICAL EXAM GENERAL:alert, no distress and comfortable SKIN: no rash  EYES: sclera clear LUNGS: decreased but clear with normal breathing effort HEART: regular rate & rhythm, R> L lower extremity edema ABDOMEN: abdomen soft, non-tender and normal bowel sounds. Previous subq nodularity over the abdomen has resolved  NEURO: alert & oriented x 3  with fluent speech, no focal motor/sensory deficits   CBC    Component Value Date/Time   WBC 5.4 10/16/2022 0918   WBC 5.0 09/08/2022 1851   RBC 4.91 10/16/2022 0918   HGB 12.1 10/16/2022 0918   HGB 13.6 10/31/2020 1343   HCT 39.4 10/16/2022 0918   HCT 40.7 10/31/2020 1343   PLT 265 10/16/2022 0918   PLT 148 (L) 10/31/2020 1343   MCV 80.2 10/16/2022 0918   MCV 88 10/31/2020 1343   MCH 24.6 (L) 10/16/2022 0918   MCHC 30.7 10/16/2022 0918   RDW 15.4 10/16/2022 0918   RDW 14.2 10/31/2020 1343   LYMPHSABS 1.3 10/16/2022 0918   LYMPHSABS 1.3 05/05/2020 1432   MONOABS 0.6 10/16/2022 0918   EOSABS 0.2 10/16/2022 0918   EOSABS 0.1 05/05/2020 1432   BASOSABS 0.1 10/16/2022 0918   BASOSABS 0.1 05/05/2020 1432     CMP     Component Value Date/Time   NA 137 01/20/2022 1903   K 4.1 01/20/2022 1903   CL 104 01/20/2022 1903   CO2 27 01/20/2022 1903   GLUCOSE 90 01/20/2022 1903   BUN 7 01/20/2022 1903   CREATININE 0.64 01/20/2022 1903   CREATININE 0.63 12/20/2021 0902   CALCIUM 9.4 01/20/2022 1903   PROT 7.8 12/20/2021 0902   ALBUMIN 4.1 12/20/2021 0902   AST 21 12/20/2021 0902   ALT 37 12/20/2021 0902   ALKPHOS 107 12/20/2021 0902   BILITOT 0.3 12/20/2021 0902   GFRNONAA >60 01/20/2022 1903   GFRNONAA >60 12/20/2021 0902   GFRAA >60 01/27/2020 0737     ASSESSMENT & PLAN:32 year old female   1.  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  -She developed acute lower extremity DVT on Xarelto, concerning for Xarelto failure.  Xarelto was stopped and she was treated with heparin in the hospital, and transition to Coumadin with INR goal 2-3 -INR had been difficult to keep therapeutic, she missed 3 doses and developed acute RLE DVT, discharged on lovenox bridge 01/22/22. -She developed hematuria and supratherapeutic INR, lovenox was stopped 02/01/22. She has been on coumadin  -She was seen by vascular surgeon Dr. 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 -Given her desire to conceive, coupled with her difficulty maintaining therapeutic INR, we switched her to lovenox injections in 05/2022. She reports compliance as of office visit on 06/27/22.  -We discussed pregnancy is a hypercoagulable state, and her risk of thrombosis risk increases even after delivery. She understands  -Ms. Buitron appears stable. Self discontinued lovenox ~07/2022. She recently had early 7 wk miscarriage s/p misoprostol and D&E 09/25/22.  -  She has 1 month h/o new RLE pain and swelling and 2 week h/o cough and DOE.  -on Exam she has R>L leg swelling and mild tachycardia on ambulation. No hypoxia -Given her clinical presentation and history, I referred her to ED for quick eval to r/o recurrent DVT/PE. She eventually agreed and was transported in stable condition.  -Will follow if she is positive and gets admitted. If not, will call her to discuss further anticoagulation, right now she doesn't want to continue injections.  -F/up pending above   PLAN: -ED to r/o recurrent DVT/PE -f/up pending    All questions were answered. The patient knows to call the clinic with any problems, questions or concerns. No barriers to learning were detected. I spent 30 minutes counseling the patient face to face. The total time spent in the appointment was 40 minutes and more than 50% was on counseling,  review of test results, and coordination of care.   Santiago Glad, NP-C 10/16/2022

## 2022-10-16 ENCOUNTER — Other Ambulatory Visit: Payer: Self-pay

## 2022-10-16 ENCOUNTER — Emergency Department (HOSPITAL_BASED_OUTPATIENT_CLINIC_OR_DEPARTMENT_OTHER): Payer: Medicaid Other

## 2022-10-16 ENCOUNTER — Other Ambulatory Visit: Payer: Self-pay | Admitting: *Deleted

## 2022-10-16 ENCOUNTER — Encounter (HOSPITAL_COMMUNITY): Payer: Self-pay

## 2022-10-16 ENCOUNTER — Emergency Department (HOSPITAL_COMMUNITY)
Admission: EM | Admit: 2022-10-16 | Discharge: 2022-10-16 | Disposition: A | Discharge status: Home / Self Care | Payer: Medicaid Other | Attending: Emergency Medicine | Admitting: Emergency Medicine

## 2022-10-16 ENCOUNTER — Emergency Department (HOSPITAL_COMMUNITY): Payer: Medicaid Other

## 2022-10-16 ENCOUNTER — Inpatient Hospital Stay: Payer: Medicaid Other | Attending: Nurse Practitioner

## 2022-10-16 ENCOUNTER — Encounter: Payer: Self-pay | Admitting: Nurse Practitioner

## 2022-10-16 ENCOUNTER — Inpatient Hospital Stay (HOSPITAL_BASED_OUTPATIENT_CLINIC_OR_DEPARTMENT_OTHER): Payer: Medicaid Other | Admitting: Nurse Practitioner

## 2022-10-16 VITALS — BP 119/82 | HR 85 | Temp 98.2°F | Resp 18 | Ht 64.0 in | Wt 152.2 lb

## 2022-10-16 DIAGNOSIS — Z86711 Personal history of pulmonary embolism: Secondary | ICD-10-CM | POA: Insufficient documentation

## 2022-10-16 DIAGNOSIS — Z20822 Contact with and (suspected) exposure to covid-19: Secondary | ICD-10-CM | POA: Insufficient documentation

## 2022-10-16 DIAGNOSIS — I10 Essential (primary) hypertension: Secondary | ICD-10-CM | POA: Diagnosis not present

## 2022-10-16 DIAGNOSIS — I82591 Chronic embolism and thrombosis of other specified deep vein of right lower extremity: Secondary | ICD-10-CM

## 2022-10-16 DIAGNOSIS — M7989 Other specified soft tissue disorders: Secondary | ICD-10-CM | POA: Diagnosis not present

## 2022-10-16 DIAGNOSIS — Z86718 Personal history of other venous thrombosis and embolism: Secondary | ICD-10-CM

## 2022-10-16 DIAGNOSIS — Z7901 Long term (current) use of anticoagulants: Secondary | ICD-10-CM | POA: Diagnosis not present

## 2022-10-16 DIAGNOSIS — I82411 Acute embolism and thrombosis of right femoral vein: Secondary | ICD-10-CM

## 2022-10-16 DIAGNOSIS — M79604 Pain in right leg: Secondary | ICD-10-CM | POA: Insufficient documentation

## 2022-10-16 DIAGNOSIS — R000 Tachycardia, unspecified: Secondary | ICD-10-CM | POA: Diagnosis not present

## 2022-10-16 DIAGNOSIS — I82401 Acute embolism and thrombosis of unspecified deep veins of right lower extremity: Secondary | ICD-10-CM | POA: Diagnosis not present

## 2022-10-16 DIAGNOSIS — R059 Cough, unspecified: Secondary | ICD-10-CM | POA: Diagnosis not present

## 2022-10-16 DIAGNOSIS — R0789 Other chest pain: Secondary | ICD-10-CM | POA: Diagnosis not present

## 2022-10-16 DIAGNOSIS — R079 Chest pain, unspecified: Secondary | ICD-10-CM

## 2022-10-16 LAB — CBC WITH DIFFERENTIAL (CANCER CENTER ONLY)
Abs Immature Granulocytes: 0.05 10*3/uL (ref 0.00–0.07)
Basophils Absolute: 0.1 10*3/uL (ref 0.0–0.1)
Basophils Relative: 1 %
Eosinophils Absolute: 0.2 10*3/uL (ref 0.0–0.5)
Eosinophils Relative: 4 %
HCT: 39.4 % (ref 36.0–46.0)
Hemoglobin: 12.1 g/dL (ref 12.0–15.0)
Immature Granulocytes: 1 %
Lymphocytes Relative: 24 %
Lymphs Abs: 1.3 10*3/uL (ref 0.7–4.0)
MCH: 24.6 pg — ABNORMAL LOW (ref 26.0–34.0)
MCHC: 30.7 g/dL (ref 30.0–36.0)
MCV: 80.2 fL (ref 80.0–100.0)
Monocytes Absolute: 0.6 10*3/uL (ref 0.1–1.0)
Monocytes Relative: 10 %
Neutro Abs: 3.2 10*3/uL (ref 1.7–7.7)
Neutrophils Relative %: 60 %
Platelet Count: 265 10*3/uL (ref 150–400)
RBC: 4.91 MIL/uL (ref 3.87–5.11)
RDW: 15.4 % (ref 11.5–15.5)
WBC Count: 5.4 10*3/uL (ref 4.0–10.5)
nRBC: 0 % (ref 0.0–0.2)

## 2022-10-16 LAB — COMPREHENSIVE METABOLIC PANEL
ALT: 17 U/L (ref 0–44)
AST: 20 U/L (ref 15–41)
Albumin: 3.9 g/dL (ref 3.5–5.0)
Alkaline Phosphatase: 73 U/L (ref 38–126)
Anion gap: 3 — ABNORMAL LOW (ref 5–15)
BUN: 13 mg/dL (ref 6–20)
CO2: 27 mmol/L (ref 22–32)
Calcium: 8.9 mg/dL (ref 8.9–10.3)
Chloride: 105 mmol/L (ref 98–111)
Creatinine, Ser: 0.6 mg/dL (ref 0.44–1.00)
GFR, Estimated: >60 mL/min (ref >60)
Glucose, Bld: 101 mg/dL — ABNORMAL HIGH (ref 70–99)
Potassium: 3.7 mmol/L (ref 3.5–5.1)
Sodium: 135 mmol/L (ref 135–145)
Total Bilirubin: 0.4 mg/dL (ref 0.3–1.2)
Total Protein: 7.8 g/dL (ref 6.5–8.1)

## 2022-10-16 LAB — CBC WITH DIFFERENTIAL/PLATELET
Abs Immature Granulocytes: 0.02 10*3/uL (ref 0.00–0.07)
Basophils Absolute: 0.1 10*3/uL (ref 0.0–0.1)
Basophils Relative: 1 %
Eosinophils Absolute: 0.3 10*3/uL (ref 0.0–0.5)
Eosinophils Relative: 6 %
HCT: 38.5 % (ref 36.0–46.0)
Hemoglobin: 11.4 g/dL — ABNORMAL LOW (ref 12.0–15.0)
Immature Granulocytes: 0 %
Lymphocytes Relative: 18 %
Lymphs Abs: 1 10*3/uL (ref 0.7–4.0)
MCH: 23.9 pg — ABNORMAL LOW (ref 26.0–34.0)
MCHC: 29.6 g/dL — ABNORMAL LOW (ref 30.0–36.0)
MCV: 80.7 fL (ref 80.0–100.0)
Monocytes Absolute: 0.6 10*3/uL (ref 0.1–1.0)
Monocytes Relative: 10 %
Neutro Abs: 3.6 10*3/uL (ref 1.7–7.7)
Neutrophils Relative %: 65 %
Platelets: 255 10*3/uL (ref 150–400)
RBC: 4.77 MIL/uL (ref 3.87–5.11)
RDW: 15.5 % (ref 11.5–15.5)
WBC: 5.6 10*3/uL (ref 4.0–10.5)
nRBC: 0 % (ref 0.0–0.2)

## 2022-10-16 LAB — HCG, QUANTITATIVE, PREGNANCY: hCG, Beta Chain, Quant, S: 12 m[IU]/mL — ABNORMAL HIGH (ref <5)

## 2022-10-16 LAB — PROTIME-INR
INR: 0.9 (ref 0.8–1.2)
Prothrombin Time: 12.5 seconds (ref 11.4–15.2)

## 2022-10-16 LAB — SARS CORONAVIRUS 2 BY RT PCR: SARS Coronavirus 2 by RT PCR: NEGATIVE

## 2022-10-16 LAB — D-DIMER, QUANTITATIVE: D-Dimer, Quant: 1.44 ug/mL-FEU — ABNORMAL HIGH (ref 0.00–0.50)

## 2022-10-16 LAB — TROPONIN I (HIGH SENSITIVITY): Troponin I (High Sensitivity): <2 ng/L (ref <18)

## 2022-10-16 LAB — APTT: aPTT: 26 seconds (ref 24–36)

## 2022-10-16 MED ORDER — ENOXAPARIN SODIUM 40 MG/0.4ML IJ SOSY: 100.0000 mg | PREFILLED_SYRINGE | INTRAMUSCULAR | 0 refills | Status: DC

## 2022-10-16 MED ORDER — IOHEXOL 350 MG/ML SOLN
75.0000 mL | Freq: Once | INTRAVENOUS | Status: AC | PRN
  Administered 2022-10-16: 75 mL via INTRAVENOUS

## 2022-10-16 MED ORDER — SODIUM CHLORIDE (PF) 0.9 % IJ SOLN
INTRAMUSCULAR | Status: AC
  Filled 2022-10-16: qty 50

## 2022-10-16 NOTE — ED Triage Notes (Signed)
[facility] brought from [facility] reports right leg pain and swelling x "a long time" chest pain x 2 weeks. Pt hx dvt was on anticoagulant therapy until 3 months ago d/t trying to have a baby.

## 2022-10-16 NOTE — Progress Notes (Signed)
Right lower extremity venous study completed.   Preliminary results relayed to MD and RN.  Please see CV Procedures for preliminary results.  Braven Wolk, RVT  1:54 PM 10/16/22

## 2022-10-16 NOTE — Progress Notes (Signed)
Open in error

## 2022-10-16 NOTE — ED Provider Notes (Signed)
[place] EMERGENCY DEPARTMENT AT [facility] Provider Note   CSN: [id number] Arrival date & time: 10/16/22  1106     History  Chief Complaint  Patient presents with   Chest Pain   Leg Pain    Andrea Hayes is a 32 y.o. female with HTN, h/o recurrent DVTs/PE, IVC thrombosis, cerebral venous thrombosis, h/o R MCA stroke, on coumadin switched to lovenox for wanting to get pregnant, who presents with leg swelling, chest pain.  Patient sent from her hematologist for right leg swelling and CP. She reports she has had several months of right leg pain and swelling. This is a/w pain with walking. She has had this before with other DVTs. She also now reports central substernal constant chest pain x 2 weeks. Thinks she has allergies, so though it was due to coughing/sneezing with allergies. Sometimes feels short of breath at rest but not right now. Patient used to be taking coumadin for extensive coagulopathy and clot history, however she was switched to lovenox due to trying to get pregnant. Patient reports that she stopped taking the lovenox many months ago because the shots in her belly were painful. Also reports she was indeed recently pregnant however she lost the pregnancy and had a miscarriage. Denies any f/c, productive cough, abdominal pain, N/V/D/C, urinary symptoms.   HPI     Home Medications Prior to Admission medications   Medication Sig Start Date End Date Taking? Authorizing Provider  enoxaparin (LOVENOX) 40 MG/0.4ML injection Inject 1 mL (100 mg total) into the skin daily. 10/16/22 11/15/22 Yes Loetta Rough, MD  oxyCODONE-acetaminophen (PERCOCET/ROXICET) 5-325 MG tablet Take 1 tablet by mouth every 4 (four) hours as needed for severe pain. 09/25/22   Adam Phenix, MD      Allergies    Patient has no known allergies.    Review of Systems   Review of Systems Review of systems Negative for fevers.  A 10 point review of systems was performed and is negative  unless otherwise reported in HPI.  Physical Exam Updated Vital Signs BP 106/73   Pulse 97   Temp 98.1 F (36.7 C)   Resp (!) 21   Ht 5\' 4"  (1.626 m)   Wt 69 kg   LMP 10/16/2022   SpO2 97%   BMI 26.11 kg/m  Physical Exam General: Normal appearing female, lying in bed.  HEENT: PERRLA, Sclera anicteric, MMM, trachea midline.  Cardiology: RRR, no murmurs/rubs/gallops. BL radial and DP pulses equal bilaterally.  Resp: Normal respiratory rate and effort. CTAB, no wheezes, rhonchi, crackles.  Abd: Soft, non-tender, non-distended. No rebound tenderness or guarding.  GU: Deferred. MSK: Diffuse RLE swelling in entire RLE >LLE. No swelling in LLE. TTP throughout RLE. No erythema/ induration. Intact distal pulses, NVI, soft compartments. Intact ROM of BL LEs.  Skin: warm, dry. No rashes or lesions. Back: No CVA tenderness Neuro: A&Ox4, CNs II-XII grossly intact. MAEs. Sensation grossly intact.  Psych: Normal mood and affect.   ED Results / Procedures / Treatments   Labs (all labs ordered are listed, but only abnormal results are displayed) Labs Reviewed  CBC WITH DIFFERENTIAL/PLATELET - Abnormal; Notable for the following components:      Result Value   Hemoglobin 11.4 (*)    MCH 23.9 (*)    MCHC 29.6 (*)    All other components within normal limits  COMPREHENSIVE METABOLIC PANEL - Abnormal; Notable for the following components:   Glucose, Bld 101 (*)    Anion gap  3 (*)    All other components within normal limits  HCG, QUANTITATIVE, PREGNANCY - Abnormal; Notable for the following components:   hCG, Beta Chain, Quant, S 12 (*)    All other components within normal limits  D-DIMER, QUANTITATIVE - Abnormal; Notable for the following components:   D-Dimer, Quant 1.44 (*)    All other components within normal limits  SARS CORONAVIRUS 2 BY RT PCR  PROTIME-INR  APTT  TROPONIN I (HIGH SENSITIVITY)  TROPONIN I (HIGH SENSITIVITY)    EKG EKG Interpretation  Date/Time:  Tuesday  October 16 2022 15:06:08 EDT Ventricular Rate:  84 PR Interval:  185 QRS Duration: 78 QT Interval:  369 QTC Calculation: 437 R Axis:   5 Text Interpretation: Sinus rhythm Confirmed by Vivi Barrack [phone]) on 10/16/2022 6:04:36 PM  Radiology CT Angio Chest PE W and/or Wo Contrast  Result Date: 10/16/2022 CLINICAL DATA:  History of dilation and curettage 2 weeks ago. Difficulty breathing/chest pain for a few days. EXAM: CT ANGIOGRAPHY CHEST WITH CONTRAST TECHNIQUE: Multidetector CT imaging of the chest was performed using the standard protocol during bolus administration of intravenous contrast. Multiplanar CT image reconstructions and MIPs were obtained to evaluate the vascular anatomy. RADIATION DOSE REDUCTION: This exam was performed according to the departmental dose-optimization program which includes automated exposure control, adjustment of the mA and/or kV according to patient size and/or use of iterative reconstruction technique. CONTRAST:  75mL OMNIPAQUE IOHEXOL 350 MG/ML SOLN COMPARISON:  CT a chest 01/21/2022. FINDINGS: Cardiovascular: There is adequate opacification of the pulmonary arteries to the segmental level. There is no evidence of pulmonary embolism. The heart size is normal. There is no pericardial effusion. The thoracic aorta is unremarkable. 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 Mediastinum/Nodes: The thyroid is unremarkable. The esophagus is grossly unremarkable. There is no mediastinal or hilar lymphadenopathy. There are prominent bilateral axillary lymph nodes which are similar to the study from 2023. Lungs/Pleura: The trachea and central airways are patent. There is no focal consolidation or pulmonary edema. There is no pleural effusion or pneumothorax. There are no suspicious nodules. Upper Abdomen: The imaged portions of the upper abdominal viscera are unremarkable. Musculoskeletal: There is no acute osseous  abnormality or suspicious osseous lesion. Review of the MIP images confirms the above findings. IMPRESSION: Pulmonary embolism or other acute cardiopulmonary pathology. Electronically Signed   By: Lesia Hausen M.D.   On: 10/16/2022 16:30   VAS [place] LOWER EXTREMITY VENOUS (DVT) (7a-7p)  Result Date: 10/16/2022  Lower Venous DVT Study Patient Name:  Andrea Hayes  Date of Exam:   10/16/2022 Medical Rec #: [id number]       Accession #:    [id number] Date of Birth: May 18, 1991       Patient Gender: F Patient Age:   44 years Exam Location:  [facility] Procedure:      VAS [place] LOWER EXTREMITY VENOUS (DVT) Referring Phys: Shaily Librizzi --------------------------------------------------------------------------------  Indications: Swelling, and SOB.  Comparison Study: Previous 01/20/22 positive. Performing Technologist: McKayla Maag RVT, VT  Examination Guidelines: A complete evaluation includes B-mode imaging, spectral Doppler, color Doppler, and power Doppler as needed of all accessible portions of each vessel. Bilateral testing is considered an integral part of a complete examination. Limited examinations for reoccurring indications may be performed as noted. The reflux portion of the exam is performed with the patient in reverse Trendelenburg.  +---------+---------------+---------+-----------+----------+--------------+ RIGHT    CompressibilityPhasicitySpontaneityPropertiesThrombus Aging +---------+---------------+---------+-----------+----------+--------------+ CFV  Partial        Yes      Yes                  Chronic        +---------+---------------+---------+-----------+----------+--------------+ SFJ      Partial                                      Chronic        +---------+---------------+---------+-----------+----------+--------------+ FV Prox  Partial        Yes      Yes                  Chronic        +---------+---------------+---------+-----------+----------+--------------+  FV Mid   Partial        Yes      Yes                  Chronic        +---------+---------------+---------+-----------+----------+--------------+ FV DistalPartial        Yes      Yes                  Chronic        +---------+---------------+---------+-----------+----------+--------------+ PFV      Partial        Yes      Yes                  Chronic        +---------+---------------+---------+-----------+----------+--------------+ POP      Full                                                        +---------+---------------+---------+-----------+----------+--------------+ PTV      Full                                                        +---------+---------------+---------+-----------+----------+--------------+ PERO     Full                                                        +---------+---------------+---------+-----------+----------+--------------+   +----+---------------+---------+-----------+----------+--------------+ LEFTCompressibilityPhasicitySpontaneityPropertiesThrombus Aging +----+---------------+---------+-----------+----------+--------------+ CFV Full           Yes      Yes                                 +----+---------------+---------+-----------+----------+--------------+ SFJ Full                                                        +----+---------------+---------+-----------+----------+--------------+     Summary: RIGHT: - Findings consistent with chronic deep vein thrombosis involving the right common femoral vein, SF junction, right  femoral vein, and right proximal profunda vein. - Findings appear improved from previous examination. - No cystic structure found in the popliteal fossa. - Ultrasound characteristics of enlarged lymph nodes are noted in the groin.  LEFT: - No evidence of common femoral vein obstruction.  *See table(s) above for measurements and observations. Electronically signed by Lemar Livings MD on 10/16/2022 at  3:32:34 PM.    Final     Procedures Procedures    Medications Ordered in ED Medications  iohexol (OMNIPAQUE) 350 MG/ML injection 75 mL (75 mLs Intravenous Contrast Given 10/16/22 1601)    ED Course/ Medical Decision Making/ A&P                          Medical Decision Making Amount and/or Complexity of Data Reviewed Labs: ordered. Decision-making details documented in ED Course. Radiology: ordered. Decision-making details documented in ED Course.  Risk Prescription drug management.    This patient presents to the ED for concern of CP, leg swelling w/ extensive coagulopathy history; this involves an extensive number of treatment options, and is a complaint that carries with it a high risk of complications and morbidity.  I considered the following differential and admission for this acute, potentially life threatening condition.   MDM:    DDX for chest pain w/ leg swelling includes but is not limited to:  Patient with extensive coagulopathy history and history of DVT/PE/IVC thrombosis/dural venous sinus thrombosis who was previously on Coumadin and switched to Lovenox due to try to get pregnant however she stopped taking the Lovenox several months ago.  She is currently without any anticoagulation at all. Lower suspicion for ACS vs aortic dissection given presenting sx, and EKG with no ischemic signs. Patient with coughing and sneezing, consider allergies vs viral syndrome vs pneumonia though no productive cough or f/c.   Clinical Course as of 10/16/22 1804  Tue Oct 16, 2022  1446 WBC: 5.6 No leukocytosis [HN]  1446 Hemoglobin(!): 11.4 Bl ~12 [HN]  1447 VAS [place] LOWER EXTREMITY VENOUS (DVT) (7a-7p) RIGHT: - Findings consistent with chronic deep vein thrombosis involving the right common femoral vein, SF junction, right femoral vein, and right proximal profunda vein. - Findings appear improved from previous examination. - No cystic structure found in the popliteal fossa. -  Ultrasound characteristics of enlarged lymph nodes are noted in the groin.   LEFT: - No evidence of common femoral vein obstruction.   [HN]  1447 Patient with extensive chronic clot in right lower extremity.  [place] actually reads mildly improved from prior exam.   There are no acute findings of new DVT in the RLE.  [HN]  1447 D-Dimer, Quant(!): 1.44 [HN]  1447 Comprehensive metabolic panel(!) Unremarkable [HN]  1448 HCG, Beta Chain, Quant, S(!): 12 Downtrending, patient reports she recently lost a pregnancy [HN]  1625 SARS Coronavirus 2 by RT PCR: NEGATIVE [HN]  1625 Troponin I (High Sensitivity): <2 [HN]  1635 CT Angio Chest PE W and/or Wo Contrast No pulmonary embolism or other acute cardiopulmonary pathology. [HN]  1635 Patient with chronic DVT and no PE. No other acute findings. No other PNA or respiratory pathology noted on CT. No emergent findings. Discussed with patient and urged her to take her lovenox as this is extremely important and likely life saving medication for her given her extensive clotting history. Discussed with her that clotting could be part of the reason she lost her most recent pregnancy and taking the lovenox could help  that as well. She states she doesn't have it at home and would like a new prescription. Ordered. Patient reports understanding. Advised to f/u with PCP if symptoms do not improve, DC w/ discharge instructions/return precautions. All questions answered to patient's satisfaction.   [HN]    Clinical Course User Index [HN] Loetta Rough, MD    Labs: I Ordered, and personally interpreted labs.  The pertinent results include:  those listed above  Imaging Studies ordered: I ordered imaging studies including DVT US, CT PE I independently visualized and interpreted imaging. I agree with the radiologist interpretation  Additional history obtained from chart review.    Cardiac Monitoring: The patient was maintained on a cardiac monitor.  I personally  viewed and interpreted the cardiac monitored which showed an underlying rhythm of: NSR  Reevaluation: After the interventions noted above, I reevaluated the patient and found that they have :improved  Social Determinants of Health: Patient lives independently   Disposition:  DC  Co morbidities that complicate the patient evaluation  Past Medical History:  Diagnosis Date   DVT (deep venous thrombosis)      Medicines Meds ordered this encounter  Medications   iohexol (OMNIPAQUE) 350 MG/ML injection 75 mL   enoxaparin (LOVENOX) 40 MG/0.4ML injection    Sig: Inject 1 mL (100 mg total) into the skin daily.    Dispense:  30 mL    Refill:  0    I have reviewed the patients home medicines and have made adjustments as needed  Problem List / ED Course: Problem List Items Addressed This Visit       Cardiovascular and Mediastinum   DVT (deep venous thrombosis) - Primary   Relevant Medications   enoxaparin (LOVENOX) 40 MG/0.4ML injection     Other   Chest pain                This note was created using dictation software, which may contain spelling or grammatical errors.    Loetta Rough, MD 10/16/22 [phone]

## 2022-10-16 NOTE — Discharge Instructions (Addendum)
Thank you for coming to [facility] Emergency Department. You were seen for leg swelling and chest pain. We did an exam, labs, and imaging, and these showed chronic DVT in your right leg. Please take your lovenox. This will help prevent the clot from being worse.  Please follow up with your primary care provider within 1 week.   Do not hesitate to return to the ED or call 911 if you experience: -Worsening symptoms -Worsening chest pain or shortness of breath -Lightheadedness, passing out -Fevers/chills -Anything else that concerns you

## 2022-10-17 ENCOUNTER — Ambulatory Visit (INDEPENDENT_AMBULATORY_CARE_PROVIDER_SITE_OTHER): Payer: Medicaid Other | Admitting: Obstetrics & Gynecology

## 2022-10-17 VITALS — BP 102/70 | HR 80 | Ht 65.0 in | Wt 153.0 lb

## 2022-10-17 DIAGNOSIS — O034 Incomplete spontaneous abortion without complication: Secondary | ICD-10-CM

## 2022-10-17 DIAGNOSIS — Z3A01 Less than 8 weeks gestation of pregnancy: Secondary | ICD-10-CM

## 2022-10-17 MED ORDER — IBUPROFEN 600 MG PO TABS: 600.0000 mg | ORAL_TABLET | Freq: Four times a day (QID) | ORAL | 1 refills | Status: DC | PRN

## 2022-10-17 NOTE — Progress Notes (Unsigned)
Subjective:     Myles Ausburn is a 32 y.o. female who presents to the clinic 3 weeks status post  D&E  for  incomplete abortion . Eating a regular diet without difficulty. Bowel movements are normal.  She has some back pain and headache  The following portions of the patient's history were reviewed and updated as appropriate: allergies, current medications, past family history, past medical history, past social history, past surgical history, and problem list.  Review of Systems Musculoskeletal:positive for back pain    Objective:    BP 102/70   Pulse 80   Ht  (1.651 m)   Wt 153 lb (69.4 kg)   LMP 10/16/2022   BMI 25.46 kg/m  General:  alert, cooperative, and no distress  Abdomen: soft, bowel sounds active, non-tender  Incision:   N/a     Assessment:    Vaginal bleeding is light and decreasing Operative findings again reviewed. Pathology report discussed. No POC c/w tissue already passed prior to the procedure   Plan:    1. Continue any current medications. 2. Wound care discussed. 3. Activity restrictions: none 4. Anticipated return to work: now. 5. Follow up: 7  mo  for annual. If bleeding does not resolve return for evaluation. Patient ID: Christiane Sistare, female   DOB: [birth year], 32 y.o.   MRN: [id number]  Adam Phenix, MD

## 2022-10-17 NOTE — Progress Notes (Unsigned)
ED visit yesterday for CP. PE ruled out. Has chronic DVT. Has not been taking lovenox. Says she will pick up today.  Requests refill Percocet.

## 2022-11-02 ENCOUNTER — Other Ambulatory Visit: Payer: Medicaid Other | Admitting: Obstetrics and Gynecology

## 2022-11-02 NOTE — Patient Outreach (Signed)
  Medicaid Managed Care   Unsuccessful Attempt Note   11/02/2022 Name: Andrea Hayes MRN: [id number] DOB: 05-Aug-1990  Referred by: Fleet Contras, MD Reason for referral : High Risk Managed Medicaid (Unsuccessful telephone outreach)  An unsuccessful telephone outreach was attempted today. The patient was referred to the case management team for assistance with care management and care coordination.    Follow Up Plan: The Managed Medicaid care management team will reach out to the patient again over the next 30 business  days. and The  Patient has been provided with contact information for the Managed Medicaid care management team and has been advised to call with any health related questions or concerns.   Kathi Der RN, BSN [place]  [employer] - Managed Medicaid High Risk [phone]

## 2022-11-02 NOTE — Patient Instructions (Signed)
Hi Ms. Suthar, I am sorry to miss you today, I hope you are doing okay- as a part of your Medicaid benefit, you are eligible for care management and care coordination services at no cost or copay. I was unable to reach you by phone today but would be happy to help you with your health related needs. Please feel free to call me at [phone].  A member of the Managed Medicaid care management team will reach out to you again over the next 30 business  days.   Kathi Der RN, BSN [place]  [employer] - Managed Medicaid High Risk [phone]

## 2022-11-08 ENCOUNTER — Other Ambulatory Visit: Payer: Medicaid Other | Admitting: Obstetrics and Gynecology

## 2022-11-08 NOTE — Patient Outreach (Signed)
  Medicaid Managed Care   Unsuccessful Attempt Note   11/08/2022 Name: Andrea Hayes MRN: [id number] DOB: 1991-02-28  Referred by: Fleet Contras, MD Reason for referral : High Risk Managed Medicaid (Unsuccessful telephone outreach)  A second unsuccessful telephone outreach was attempted today. The patient was referred to the case management team for assistance with care management and care coordination.    Follow Up Plan: The Managed Medicaid care management team will reach out to the patient again over the next 30 business  days. and The  Patient has been provided with contact information for the Managed Medicaid care management team and has been advised to call with any health related questions or concerns.   Kathi Der RN, BSN [place]  [employer] - Managed Medicaid High Risk [phone]

## 2022-11-08 NOTE — Patient Instructions (Signed)
Visit Information  Ms. Naveen Pinkney  - as a part of your Medicaid benefit, you are eligible for care management and care coordination services at no cost or copay. I was unable to reach you by phone today but would be happy to help you with your health related needs. Please feel free to call me at [phone]  A member of the Managed Medicaid care management team will reach out to you again over the next 30 business  days.   Jonathan Kirkendoll RN, BSN [place]  [facility] Care Management Coordinator - Managed Medicaid High Risk [phone]   

## 2022-11-15 ENCOUNTER — Telehealth: Payer: Self-pay

## 2022-11-15 NOTE — Telephone Encounter (Signed)
..   Medicaid Managed Care   Unsuccessful Outreach Note  11/15/2022 Name: Andrea Hayes MRN: [id number] DOB: 1991-03-23  Referred by: Fleet Contras, MD Reason for referral : Appointment   Third unsuccessful telephone outreach was attempted today. The patient was referred to the case management team for assistance with care management and care coordination. The patient's primary care provider has been notified of our unsuccessful attempts to make or maintain contact with the patient. The care management team is pleased to engage with this patient at any time in the future should he/she be interested in assistance from the care management team.   Follow Up Plan: We have been unable to make contact with the patient for follow up. The care management team is available to follow up with the patient after provider conversation with the patient regarding recommendation for care management engagement and subsequent re-referral to the care management team.    Weston Settle Care Guide  [facility] Managed  Care Guide [facility]  [phone]   [facility]

## 2022-11-19 ENCOUNTER — Telehealth: Payer: Self-pay

## 2022-11-19 NOTE — Progress Notes (Signed)
ERROR

## 2022-11-29 ENCOUNTER — Other Ambulatory Visit: Payer: Medicaid Other | Admitting: Obstetrics and Gynecology

## 2022-11-29 NOTE — Patient Outreach (Signed)
Care Coordination  11/29/2022  Andrea Hayes 1991-05-27 [id number]   Medicaid Managed Care   Unsuccessful Outreach Note  11/29/2022 Name: Andrea Hayes MRN: [id number] DOB: [birth year]  Referred by: Fleet Contras, MD Reason for referral : High Risk Managed Medicaid (Unsuccessful telephone outreach)  Fourth unsuccessful telephone outreach was attempted today. The patient was referred to the case management team for assistance with care management and care coordination. The patient's primary care provider has been notified of our unsuccessful attempts to make or maintain contact with the patient. The care management team is pleased to engage with this patient at any time in the future should he/she be interested in assistance from the care management team.   Follow Up Plan: The patient has been provided with contact information for the care management team and has been advised to call with any health related questions or concerns.  We have been unable to make contact with the patient for follow up. The care management team is available to follow up with the patient after provider conversation with the patient regarding recommendation for care management engagement and subsequent re-referral to the care management team.   Kathi Der RN, BSN [place]  [facility] Care Management Coordinator - Managed [place] High Risk [phone]

## 2022-11-29 NOTE — Patient Instructions (Signed)
Visit Information  Ms. Marcina Delrossi  - as a part of your Medicaid benefit, you are eligible for care management and care coordination services at no cost or copay. I was unable to reach you by phone today but would be happy to help you with your health related needs. Please feel free to call me at [phone].  Kathi Der RN, BSN [place]  [employer] - Managed Medicaid High Risk [phone]

## 2022-11-29 NOTE — Patient Outreach (Signed)
RNCM returned patient's phone call.  Patient with no complaints today, states she is administering Lovenox as directed.  Has [facility]UR[facility] appt in July.  Kathi Der RN, BSN [place]  [employer] - Managed Medicaid High Risk [phone]

## 2023-01-15 NOTE — Progress Notes (Unsigned)
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   UPDATE: Currently not on acetazolamide.  Last visit, I referred again to ophthalmology ***   HISTORY: She began experiencing blurred vision and intermittent headaches on top of head.  In October.  She also reports visual obscurations as well.  No pulsatile tinnitus.  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  The next day she went to the ED for further evaluation and treatment.  MRI of brain with and without contrast was personally reviewed and demonstrated tortuous optic nerves with prominent optic nerve sheath fluid which may be seen in setting of intracranial hypertension.  Neurology was consulted and LP was recommended.  However, she declined the procedure and left against medical advice.    She followed up with me in January 2020 and underwent workup for intracranial hypertension.  Lumbar puncture from 07/09/18 demonstrated an opening pressure of 47 cm water with closing pressure of 16 cm water.  CSF cell count, glucose, protein and culture were normal.  She was started on Diamox 500mg  twice daily.  MRV of head from 07/12/18 was personally reviewed and was normal with no evidence of thrombosis or stenosis.  She presented [facility] on 07/24/18 for severe headache with nausea, vomiting and photophobia.  CT of head was personally reviewed and  showed dural venous sinus thrombosis involving the superior sagittal sinus, straight sinus, right transverse sinus and sigmoid sinus and smaller branching veins.  Follow up CTV of head confirmed acute venous sinus thrombosis from superior sagittal sinus to right sigmoid sinus and within intracerebral veins.  Hypercoagulable workup was unremarkable, including homocystein 4.8, antithrombin III mildly elevated at 128, low protein S activity of 22, total protein S 109, protein C activity 180, total protein C 127, lupus anticoagulant 25.3, DRVVT 44.7, beta-2-glycoprotein antibodies <9, factor V leiden mutation negative, prothrombin gene mutation negative, cardiolipin antibodies negative, sickle cell screen negative and negative for pregnancy.  She was started on lovenox and bridged to warfarin.  She was discharged on Fioricet for her headaches.  She was started on topiramate 50mg  twice daily.  Headache never improved and returned to the ED on 08/04/18. INR was therapeutic at 2.43. CT personally reviewed and showed subacute dural venous sinus thrombosis and intracerebral veins with partial recanalization and no acute component.  She received IV Reglan and Benadryl and headache resolved.  Headaches subsequently subsided, so she self-discontinued medications - warfarin, acetazolamide and topiramate, and lost to follow up.  She became pregnant in late 2021.  Started having headaches and pulsatile tinnitus again.  MRV of head without contrast on 05/24/2020 showed worsened segmental superior sagittal sinus thrombosis (compared to CTV 08/05/18) and flow in both transverse and sigmoid sinuses but with chronic scarring of right transverse sinus region, likely sequelae of previous thrombosis/stroke but cannot rule out active/recent thrombosis.  Given that she had prematurely stopped warfarin, we took precaution and had her  on Lovenox during her pregnancy.  She was also restarted on acetazolamide 500mg  twice daily.  She was  hospitalized for headache on 08/12/2020.  Repeat MRI/MRV of head without contrast at that time were stable.  Headaches are improved.  Denies visual obscurations, blurred vision or pulsatile tinnitus.  She stopped taking the acetazolamide regularly. Underwent induction of labor at 37 weeks on 11/04/2020 and gave birth to a girl.  Baby is healthy. Patient is nursing.  She presented to the hospital on 11/28/2020 for severe headaches  MRI and MRV of brain with and without contrast was negative for acute stroke/bleed or acute venous thrombosis.  She was restarted on acetazolamide 250mg  twice daily.    PAST MEDICAL HISTORY: Past Medical History:  Diagnosis Date   DVT (deep venous thrombosis) (HCC)     MEDICATIONS: Current Outpatient Medications on File Prior to Visit  Medication Sig Dispense Refill   enoxaparin (LOVENOX) 40 MG/0.4ML injection Inject 1 mL (100 mg total) into the skin daily. (Patient not taking: Reported on 10/17/2022) 30 mL 0   ibuprofen (ADVIL) 600 MG tablet Take 1 tablet (600 mg total) by mouth every 6 (six) hours as needed. 30 tablet 1   oxyCODONE-acetaminophen (PERCOCET/ROXICET) 5-325 MG tablet Take 1 tablet by mouth every 4 (four) hours as needed for severe pain. 10 tablet 0   No current facility-administered medications on file prior to visit.    ALLERGIES: No Known Allergies  FAMILY HISTORY: Family History  Problem Relation Age of Onset   Asthma Father    Obesity Neg Hx    Hypertension Neg Hx    Diabetes Neg Hx    Cancer Neg Hx       Objective:  *** General: No acute distress.  Patient appears well-groomed.   Head:  Normocephalic/atraumatic Eyes:  Fundi examined but not visualized Neck: supple, no paraspinal tenderness, full range of motion Heart:  Regular rate and rhythm Neurological Exam: ***   Shon Millet, DO  CC: Andrea Contras, MD

## 2023-01-16 ENCOUNTER — Encounter: Payer: Self-pay | Admitting: Neurology

## 2023-01-16 ENCOUNTER — Ambulatory Visit: Payer: Medicaid Other | Admitting: Neurology

## 2023-01-16 VITALS — BP 112/78 | HR 72 | Ht 64.0 in | Wt 153.8 lb

## 2023-01-16 DIAGNOSIS — G932 Benign intracranial hypertension: Secondary | ICD-10-CM | POA: Diagnosis not present

## 2023-01-16 DIAGNOSIS — Z86718 Personal history of other venous thrombosis and embolism: Secondary | ICD-10-CM | POA: Diagnosis not present

## 2023-01-16 NOTE — Patient Instructions (Signed)
I would still like you to get a formal eye exam.  Will send referral. If you do not get a call in one week, contact me.  As you are feeling well, no routine follow up needed unless eye exam shows evidence of increased head pressure.

## 2023-01-22 ENCOUNTER — Other Ambulatory Visit: Payer: Self-pay | Admitting: Internal Medicine

## 2023-01-23 LAB — LIPID PANEL
Cholesterol: 113 mg/dL (ref <200)
HDL: 50 mg/dL (ref >=50)
LDL Cholesterol (Calc): 45 mg/dL (calc)
Non-HDL Cholesterol (Calc): 63 mg/dL (calc) (ref <130)
Total CHOL/HDL Ratio: 2.3 (calc) (ref <5.0)
Triglycerides: 97 mg/dL (ref <150)

## 2023-01-23 LAB — CBC
HCT: 35.5 % (ref 35.0–45.0)
Hemoglobin: 10.7 g/dL — ABNORMAL LOW (ref 11.7–15.5)
MCH: 23.8 pg — ABNORMAL LOW (ref 27.0–33.0)
MCHC: 30.1 g/dL — ABNORMAL LOW (ref 32.0–36.0)
MCV: 79.1 fL — ABNORMAL LOW (ref 80.0–100.0)
MPV: 10.6 fL (ref 7.5–12.5)
Platelets: 247 10*3/uL (ref 140–400)
RBC: 4.49 10*6/uL (ref 3.80–5.10)
RDW: 14.9 % (ref 11.0–15.0)
WBC: 3.6 10*3/uL — ABNORMAL LOW (ref 3.8–10.8)

## 2023-01-23 LAB — COMPLETE METABOLIC PANEL WITH GFR
AG Ratio: 1.2 (calc) (ref 1.0–2.5)
ALT: 18 U/L (ref 6–29)
AST: 28 U/L (ref 10–30)
Albumin: 4.3 g/dL (ref 3.6–5.1)
Alkaline phosphatase (APISO): 89 U/L (ref 31–125)
BUN: 15 mg/dL (ref 7–25)
CO2: 22 mmol/L (ref 20–32)
Calcium: 9.5 mg/dL (ref 8.6–10.2)
Chloride: 104 mmol/L (ref 98–110)
Creat: 0.61 mg/dL (ref 0.50–0.97)
Globulin: 3.5 g/dL (calc) (ref 1.9–3.7)
Glucose, Bld: 80 mg/dL (ref 65–99)
Potassium: 4.3 mmol/L (ref 3.5–5.3)
Sodium: 138 mmol/L (ref 135–146)
Total Bilirubin: 0.3 mg/dL (ref 0.2–1.2)
Total Protein: 7.8 g/dL (ref 6.1–8.1)
eGFR: 122 mL/min/{1.73_m2} (ref >=60)

## 2023-01-23 LAB — TSH: TSH: 2.97 mIU/L

## 2023-01-23 LAB — VITAMIN D 25 HYDROXY (VIT D DEFICIENCY, FRACTURES): Vit D, 25-Hydroxy: 74 ng/mL (ref 30–100)

## 2023-01-28 ENCOUNTER — Other Ambulatory Visit (HOSPITAL_COMMUNITY): Payer: Self-pay | Admitting: Internal Medicine

## 2023-01-28 ENCOUNTER — Ambulatory Visit (HOSPITAL_COMMUNITY)
Admission: RE | Admit: 2023-01-28 | Discharge: 2023-01-28 | Disposition: A | Discharge status: Home / Self Care | Payer: Medicaid Other | Source: Ambulatory Visit | Attending: Surgery | Admitting: Surgery

## 2023-01-28 DIAGNOSIS — Z86718 Personal history of other venous thrombosis and embolism: Secondary | ICD-10-CM

## 2023-02-14 ENCOUNTER — Inpatient Hospital Stay (HOSPITAL_COMMUNITY)
Admission: AD | Admit: 2023-02-14 | Discharge: 2023-02-14 | Disposition: A | Discharge status: Home / Self Care | Payer: Medicaid Other | Attending: Obstetrics and Gynecology | Admitting: Obstetrics and Gynecology

## 2023-02-14 ENCOUNTER — Other Ambulatory Visit: Payer: Self-pay

## 2023-02-14 DIAGNOSIS — Z3202 Encounter for pregnancy test, result negative: Secondary | ICD-10-CM | POA: Diagnosis present

## 2023-02-14 DIAGNOSIS — Z3200 Encounter for pregnancy test, result unknown: Secondary | ICD-10-CM

## 2023-02-14 LAB — POCT PREGNANCY, URINE: Preg Test, Ur: NEGATIVE

## 2023-02-14 LAB — HCG, QUANTITATIVE, PREGNANCY: hCG, Beta Chain, Quant, S: <1 m[IU]/mL (ref <5)

## 2023-02-14 NOTE — Discharge Instructions (Signed)
It was a pleasure taking care of you today.  Unfortunately your pregnancy test came back negative.  We did check your blood hCG levels and is also came back showing that you are not pregnant.  If you have any issues or questions please follow-up with your primary care provider.  I hope you have a wonderful day!

## 2023-02-14 NOTE — MAU Note (Signed)
.  Andrea Hayes is a 32 y.o. at Unknown here in MAU reporting: she wants pregnancy confirmation.  Has taken several HPT and has had both positive and negative results.  Denies current pain or VB. LMP: 01/09/2023 Onset of complaint: today Pain score: 0 Vitals:   02/14/23 0759  BP: 120/79  Pulse: 80  Resp: 19  Temp: 98.1 F (36.7 C)     FHT:NA Lab orders placed from triage:   None

## 2023-02-14 NOTE — MAU Provider Note (Signed)
History     CSN: [id number]  Arrival date and time: 02/14/23 0731   None     Chief Complaint  Patient presents with   Possible Pregnancy   HPI Andrea Hayes is a 32 year old presenting for confirmation of pregnancy.  Reports that last night she was having some left-sided back pain and took a pregnancy test.  She took 1 pregnancy test that was positive, repeated the pregnancy test which was negative and then she took a third pregnancy test which came back positive.  She is just unsure if she is pregnant.  LMP was in July.  Denies any vaginal bleeding.  Denies any other symptoms.   OB History     Gravida  2   Para  1   Term  1   Preterm      AB      Living  1      SAB      IAB      Ectopic      Multiple  0   Live Births  1           Past Medical History:  Diagnosis Date   DVT (deep venous thrombosis) (HCC)     Past Surgical History:  Procedure Laterality Date   BIOPSY  04/05/2021   Procedure: BIOPSY;  Surgeon: Jenel Lucks, MD;  Location: [facility] ENDOSCOPY;  Service: Gastroenterology;;   COLONOSCOPY N/A 04/05/2021   Procedure: COLONOSCOPY;  Surgeon: Jenel Lucks, MD;  Location: [facility] ENDOSCOPY;  Service: Gastroenterology;  Laterality: N/A;   DILATION AND EVACUATION N/A 09/25/2022   Procedure: DILATATION AND EVACUATION;  Surgeon: Adam Phenix, MD;  Location: [facility] OR;  Service: Gynecology;  Laterality: N/A;   ESOPHAGOGASTRODUODENOSCOPY N/A 04/05/2021   Procedure: ESOPHAGOGASTRODUODENOSCOPY (EGD);  Surgeon: Jenel Lucks, MD;  Location: [facility] ENDOSCOPY;  Service: Gastroenterology;  Laterality: N/A;   LOWER EXTREMITY VENOGRAPHY Right 01/22/2022   Procedure: LOWER EXTREMITY VENOGRAPHY;  Surgeon: Leonie Douglas, MD;  Location: [facility] INVASIVE CV LAB;  Service: Cardiovascular;  Laterality: Right;   PERIPHERAL VASCULAR BALLOON ANGIOPLASTY  03/24/2021   Procedure: PERIPHERAL VASCULAR BALLOON ANGIOPLASTY;  Surgeon: Leonie Douglas, MD;  Location: [facility]  INVASIVE CV LAB;  Service: Cardiovascular;;  bilateral common iliacs   PERIPHERAL VASCULAR THROMBECTOMY N/A 03/23/2021   Procedure: PERIPHERAL VASCULAR THROMBECTOMY;  Surgeon: Maeola Harman, MD;  Location: [facility] INVASIVE CV LAB;  Service: Cardiovascular;  Laterality: N/A;   PERIPHERAL VASCULAR THROMBECTOMY N/A 03/24/2021   Procedure: LYSIS RECHECK;  Surgeon: Leonie Douglas, MD;  Location: [facility] INVASIVE CV LAB;  Service: Cardiovascular;  Laterality: N/A;    Family History  Problem Relation Age of Onset   Asthma Father    Obesity Neg Hx    Hypertension Neg Hx    Diabetes Neg Hx    Cancer Neg Hx     Social History   Tobacco Use   Smoking status: Never    Passive exposure: Never   Smokeless tobacco: Never  Vaping Use   Vaping status: Never Used  Substance Use Topics   Alcohol use: Never   Drug use: Never    Allergies: No Known Allergies  Medications Prior to Admission  Medication Sig Dispense Refill Last Dose   cholecalciferol (VITAMIN D3) 25 MCG (1000 UNIT) tablet Take 1,000 Units by mouth daily.      enoxaparin (LOVENOX) 40 MG/0.4ML injection Inject 1 mL (100 mg total) into the skin daily. (Patient not taking: Reported on 10/17/2022) 30 mL 0  ibuprofen (ADVIL) 600 MG tablet Take 1 tablet (600 mg total) by mouth every 6 (six) hours as needed. 30 tablet 1     Review of Systems  Gastrointestinal:  Negative for constipation, diarrhea, nausea and vomiting.  Genitourinary:  Negative for vaginal bleeding and vaginal discharge.  Musculoskeletal:  Positive for back pain.  Neurological:  Negative for headaches.  All other systems reviewed and are negative.  Physical Exam   Blood pressure 120/79, pulse 80, temperature 98.1 F (36.7 C), temperature source Oral, resp. rate 19, height 5\' 4"  (1.626 m), weight 69.6 kg, last menstrual period 01/09/2023.  Physical Exam Vitals and nursing note reviewed.  Constitutional:      Appearance: Normal appearance. She is normal  weight.  HENT:     Head: Normocephalic.     Nose: Nose normal.     Mouth/Throat:     Mouth: Mucous membranes are moist.  Eyes:     Extraocular Movements: Extraocular movements intact.     Pupils: Pupils are equal, round, and reactive to light.  Cardiovascular:     Rate and Rhythm: Normal rate.     Pulses: Normal pulses.  Pulmonary:     Effort: Pulmonary effort is normal.  Abdominal:     General: Abdomen is flat. There is no distension.  Musculoskeletal:        General: Normal range of motion.     Cervical back: Normal range of motion.  Skin:    General: Skin is warm.     Capillary Refill: Capillary refill takes less than 2 seconds.  Neurological:     General: No focal deficit present.     Mental Status: She is alert.  Psychiatric:        Mood and Affect: Mood normal.    MAU Course  Procedures  MDM Urine pregnancy test Beta-hCG quant  Assessment and Plan  Andrea Hayes presenting for pregnancy confirmation.  Also having left-sided back pain which has resolved.  Pregnancy confirmation Patient with positive urine pregnancy test at home but took a repeat which was negative.  Urine hCG test in the hospital today was negative.  Beta-hCG quant negative.  Patient's pain is resolved.  Patient discharged home with return precautions.  Celedonio Savage 02/14/2023, 9:22 AM

## 2023-03-01 ENCOUNTER — Other Ambulatory Visit: Payer: Medicaid Other | Admitting: Obstetrics and Gynecology

## 2023-03-01 ENCOUNTER — Encounter: Payer: Self-pay | Admitting: Obstetrics and Gynecology

## 2023-03-01 NOTE — Patient Outreach (Signed)
Care Coordination  03/01/2023  Andrea Hayes December 21, 1990 [id number]  Follow up call to patient.  Patient seen by NEURO in July.  Has not had eye appointment yet.  Encouraged patient to follow up on referral.  No complaints today.  Kathi Der RN, BSN [name]  [employer] - Managed Medicaid High Risk [phone]

## 2023-03-04 NOTE — Progress Notes (Unsigned)
[facility] [place] PROGRESS NOTE  ASSESSMENT / PLAN: Andrea Hayes is a 32 y.o. female who returns to clinic for discussion of chronic venous occlusion of the right lower extremity.  This is currently causing only mild symptoms.  I encouraged her to avoid intervention as long as she can tolerate.  Hopefully collateralization will improve over time and her leg will be less bothersome to her.  I encouraged compression and elevation for symptomatic relief.  SUBJECTIVE: Returns to discuss venogram findings.  I reviewed the findings with her and explained the nature of chronic venous occlusion.  She describes some swelling in bilateral lower extremities, right worse than left.  The swelling is only minimally bothersome to her.  OBJECTIVE: There were no vitals taken for this visit.  No complaints Regular rate and rhythm Unlabored breathing Mild swelling of bilateral lower extremities.     Latest Ref Rng & Units 01/22/2023   10:05 AM 10/16/2022   12:23 PM 10/16/2022    9:18 AM  CBC  WBC 3.8 - 10.8 Thousand/uL 3.6  5.6  5.4   Hemoglobin 11.7 - 15.5 g/dL 52.8  41.3  24.4   Hematocrit 35.0 - 45.0 % 35.5  38.5  39.4   Platelets 140 - 400 Thousand/uL 247  255  265         Latest Ref Rng & Units 01/22/2023   10:05 AM 10/16/2022   12:23 PM 01/20/2022    7:03 PM  CMP  Glucose 65 - 99 mg/dL 80  010  90   BUN 7 - 25 mg/dL 15  13  7    Creatinine 0.50 - 0.97 mg/dL 2.72  5.36  6.44   Sodium 135 - 146 mmol/L 138  135  137   Potassium 3.5 - 5.3 mmol/L 4.3  3.7  4.1   Chloride 98 - 110 mmol/L 104  105  104   CO2 20 - 32 mmol/L 22  27  27    Calcium 8.6 - 10.2 mg/dL 9.5  8.9  9.4   Total Protein 6.1 - 8.1 g/dL 7.8  7.8    Total Bilirubin 0.2 - 1.2 mg/dL 0.3  0.4    Alkaline Phos 38 - 126 U/L  73    AST 10 - 30 U/L 28  20    ALT 6 - 29 U/L 18  17      CrCl cannot be calculated (Patient's most recent lab result is older than the maximum 21 days allowed.).  Rande Brunt.  Lenell Antu, MD [facility] Phone Number: [phone] 03/04/2023 2:08 PM

## 2023-03-05 ENCOUNTER — Other Ambulatory Visit (HOSPITAL_COMMUNITY): Payer: Self-pay | Admitting: Vascular Surgery

## 2023-03-05 ENCOUNTER — Ambulatory Visit: Payer: Medicaid Other | Admitting: Vascular Surgery

## 2023-03-05 ENCOUNTER — Ambulatory Visit (HOSPITAL_COMMUNITY)
Admission: RE | Admit: 2023-03-05 | Discharge: 2023-03-05 | Disposition: A | Discharge status: Home / Self Care | Payer: Medicaid Other | Source: Ambulatory Visit | Attending: Vascular Surgery | Admitting: Vascular Surgery

## 2023-03-05 ENCOUNTER — Encounter: Payer: Self-pay | Admitting: Vascular Surgery

## 2023-03-05 VITALS — BP 97/64 | HR 102 | Temp 98.6°F | Resp 20 | Ht 64.0 in | Wt 152.0 lb

## 2023-03-05 DIAGNOSIS — R52 Pain, unspecified: Secondary | ICD-10-CM | POA: Insufficient documentation

## 2023-03-05 DIAGNOSIS — I82422 Acute embolism and thrombosis of left iliac vein: Secondary | ICD-10-CM | POA: Diagnosis not present

## 2023-03-05 MED ORDER — RIVAROXABAN (XARELTO) VTE STARTER PACK (15 & 20 MG): ORAL_TABLET | ORAL | 0 refills | Status: DC

## 2023-04-02 ENCOUNTER — Other Ambulatory Visit: Payer: Self-pay

## 2023-04-02 ENCOUNTER — Other Ambulatory Visit: Payer: Self-pay | Admitting: Vascular Surgery

## 2023-04-02 MED ORDER — RIVAROXABAN 20 MG PO TABS: 20.0000 mg | ORAL_TABLET | Freq: Every day | ORAL | 4 refills | Status: DC

## 2023-05-14 ENCOUNTER — Ambulatory Visit: Payer: Medicaid Other | Admitting: Obstetrics and Gynecology

## 2023-05-14 ENCOUNTER — Encounter: Payer: Self-pay | Admitting: Obstetrics and Gynecology

## 2023-05-14 ENCOUNTER — Other Ambulatory Visit (HOSPITAL_COMMUNITY)
Admission: RE | Admit: 2023-05-14 | Discharge: 2023-05-14 | Disposition: A | Discharge status: Home / Self Care | Payer: Medicaid Other | Source: Ambulatory Visit | Attending: Obstetrics and Gynecology | Admitting: Obstetrics and Gynecology

## 2023-05-14 VITALS — BP 107/74 | HR 81 | Ht 63.0 in | Wt 151.0 lb

## 2023-05-14 DIAGNOSIS — Z01419 Encounter for gynecological examination (general) (routine) without abnormal findings: Secondary | ICD-10-CM | POA: Insufficient documentation

## 2023-05-14 MED ORDER — PREPLUS 27-1 MG PO TABS: 1.0000 | ORAL_TABLET | Freq: Every day | ORAL | 13 refills | Status: AC

## 2023-05-14 NOTE — Progress Notes (Signed)
Subjective:     Andrea Hayes is a 32 y.o. female G2P1011 with LMP 04/15/23 and BMI 26 who is here for a comprehensive physical exam. The patient reports no problems. Patient reports a monthly period lasting 3-4 days. She is sexually active without contraception as she seeks conception. She denies pelvic pain or abnormal discharge. She reports some occasional postcoital vaginal bleeding. She denies any urinary complaints. 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  Past Medical History:  Diagnosis Date   DVT (deep venous thrombosis) [facility])    Past Surgical History:  Procedure Laterality Date   BIOPSY  04/05/2021   Procedure: BIOPSY;  Surgeon: Jenel Lucks, MD;  Location: [facility] ENDOSCOPY;  Service: Gastroenterology;;   COLONOSCOPY N/A 04/05/2021   Procedure: COLONOSCOPY;  Surgeon: Jenel Lucks, MD;  Location: [facility] ENDOSCOPY;  Service: Gastroenterology;  Laterality: N/A;   DILATION AND EVACUATION N/A 09/25/2022   Procedure: DILATATION AND EVACUATION;  Surgeon: Adam Phenix, MD;  Location: [facility] OR;  Service: Gynecology;  Laterality: N/A;   ESOPHAGOGASTRODUODENOSCOPY N/A 04/05/2021   Procedure: ESOPHAGOGASTRODUODENOSCOPY (EGD);  Surgeon: Jenel Lucks, MD;  Location: [facility] ENDOSCOPY;  Service: Gastroenterology;  Laterality: N/A;   LOWER EXTREMITY VENOGRAPHY Right 01/22/2022   Procedure: LOWER EXTREMITY VENOGRAPHY;  Surgeon: Leonie Douglas, MD;  Location: [facility] INVASIVE CV LAB;  Service: Cardiovascular;  Laterality: Right;   PERIPHERAL VASCULAR BALLOON ANGIOPLASTY  03/24/2021   Procedure: PERIPHERAL VASCULAR BALLOON ANGIOPLASTY;  Surgeon: Leonie Douglas, MD;  Location: [facility] INVASIVE CV LAB;  Service: Cardiovascular;;  bilateral common iliacs   PERIPHERAL VASCULAR THROMBECTOMY N/A 03/23/2021   Procedure: PERIPHERAL VASCULAR THROMBECTOMY;  Surgeon: Maeola Harman, MD;  Location: [facility] INVASIVE CV LAB;  Service: Cardiovascular;  Laterality: N/A;    PERIPHERAL VASCULAR THROMBECTOMY N/A 03/24/2021   Procedure: LYSIS RECHECK;  Surgeon: Leonie Douglas, MD;  Location: [facility] INVASIVE CV LAB;  Service: Cardiovascular;  Laterality: N/A;   Family History  Problem Relation Age of Onset   Asthma Father    Obesity Neg Hx    Hypertension Neg Hx    Diabetes Neg Hx    Cancer Neg Hx    Social History   Socioeconomic History   Marital status: Single    Spouse name: Not on file   Number of children: Not on file   Years of education: Not on file   Highest education level: Not on file  Occupational History   Not on file  Tobacco Use   Smoking status: Never    Passive exposure: Never   Smokeless tobacco: Never  Vaping Use   Vaping status: Never Used  Substance and Sexual Activity   Alcohol use: Never   Drug use: Never   Sexual activity: Yes    Birth control/protection: None  Other Topics Concern   Not on file  Social History Narrative   Right handed   Social Determinants of Health   Financial Resource Strain: Low Risk  (06/06/2021)   Overall Financial Resource Strain (CARDIA)    Difficulty of Paying Living Expenses: Not very hard  Food Insecurity: No Food Insecurity (06/06/2021)   Hunger Vital Sign    Worried About Running Out of Food in the Last Year: Never true    Ran Out of Food in the Last Year: Never true  Transportation Needs: No Transportation Needs (03/01/2023)   PRAPARE - Administrator, Civil Service (Medical): No    Lack of Transportation (Non-Medical): No  Physical Activity:  Inactive (06/06/2021)   Exercise Vital Sign    Days of Exercise per Week: 0 days    Minutes of Exercise per Session: 0 min  Stress: No Stress Concern Present (06/06/2021)   [employer] of Occupational Health - Occupational Stress Questionnaire    Feeling of Stress : Only a little  Social Connections: Unknown (11/01/2021)   Received from [facility]   Social Network    Social Network: Not on file  Intimate Partner  Violence: Not At Risk (08/08/2022)   Humiliation, Afraid, Rape, and Kick questionnaire    Fear of Current or Ex-Partner: No    Emotionally Abused: No    Physically Abused: No    Sexually Abused: No   Health Maintenance  Topic Date Due   INFLUENZA VACCINE  Never done   COVID-19 Vaccine (1 - 2023-24 season) Never done   Cervical Cancer Screening (HPV/Pap Cotest)  05/06/2023   DTaP/Tdap/Td (2 - Td or Tdap) 10/18/2030   Hepatitis C Screening  Completed   HIV Screening  Completed   HPV VACCINES  Aged Out       Review of Systems Pertinent items noted in HPI and remainder of comprehensive ROS otherwise negative.   Objective:  Blood pressure 107/74, pulse 81, height 5\' 3"  (1.6 m), weight 151 lb (68.5 kg), last menstrual period 04/15/2023.   GENERAL: Well-developed, well-nourished female in no acute distress.  HEENT: Normocephalic, atraumatic. Sclerae anicteric.  NECK: Supple. Normal thyroid.  LUNGS: Clear to auscultation bilaterally.  HEART: Regular rate and rhythm. BREASTS: Symmetric in size. No palpable masses or lymphadenopathy, skin changes, or nipple drainage. ABDOMEN: Soft, nontender, nondistended. No organomegaly. PELVIC: Normal external female genitalia. Vagina is pink and rugated.  Normal discharge. Normal appearing cervix. Uterus is normal in size. No adnexal mass or tenderness. Chaperone present during the pelvic exam EXTREMITIES: No cyanosis, clubbing, or edema, 2+ distal pulses.     Assessment:    Healthy female exam.      Plan:    Pap smear collected STI screening per patient request Health maintenance labs ordered Patient will be contacted with abnormal results Patient encouraged to take prenatal vitamins Patient advised to follow up with hematology regarding plans for conception for clearance and change in anticoagulation See After Visit Summary for Counseling Recommendations

## 2023-05-14 NOTE — Progress Notes (Signed)
Pt was referred for irregular vaginal bleeding. Pt states she has been bleeding on and off since October.  Also having bleeding with intercourse. Pt would like to know if she is still able to try for pregnancy.  Pt is not bleeding much today and would like AEX with all testing.

## 2023-05-15 LAB — CERVICOVAGINAL ANCILLARY ONLY
Chlamydia: NEGATIVE
Comment: NEGATIVE
Comment: NORMAL
Neisseria Gonorrhea: NEGATIVE

## 2023-05-16 LAB — RPR: RPR Ser Ql: NONREACTIVE

## 2023-05-16 LAB — LIPID PANEL
Chol/HDL Ratio: 2.2 ratio (ref 0.0–4.4)
Cholesterol, Total: 115 mg/dL (ref 100–199)
HDL: 52 mg/dL (ref >39)
LDL Chol Calc (NIH): 45 mg/dL (ref 0–99)
Triglycerides: 93 mg/dL (ref 0–149)
VLDL Cholesterol Cal: 18 mg/dL (ref 5–40)

## 2023-05-16 LAB — TSH: TSH: 1.39 u[IU]/mL (ref 0.450–4.500)

## 2023-05-16 LAB — COMPREHENSIVE METABOLIC PANEL
ALT: 21 [IU]/L (ref 0–32)
AST: 25 [IU]/L (ref 0–40)
Albumin: 4.5 g/dL (ref 3.9–4.9)
Alkaline Phosphatase: 86 [IU]/L (ref 44–121)
BUN/Creatinine Ratio: 13 (ref 9–23)
BUN: 9 mg/dL (ref 6–20)
Bilirubin Total: 0.5 mg/dL (ref 0.0–1.2)
CO2: 25 mmol/L (ref 20–29)
Calcium: 9.9 mg/dL (ref 8.7–10.2)
Chloride: 101 mmol/L (ref 96–106)
Creatinine, Ser: 0.68 mg/dL (ref 0.57–1.00)
Globulin, Total: 3.6 g/dL (ref 1.5–4.5)
Glucose: 77 mg/dL (ref 70–99)
Potassium: 4.3 mmol/L (ref 3.5–5.2)
Sodium: 137 mmol/L (ref 134–144)
Total Protein: 8.1 g/dL (ref 6.0–8.5)
eGFR: 118 mL/min/{1.73_m2} (ref >59)

## 2023-05-16 LAB — HEMOGLOBIN A1C
Est. average glucose Bld gHb Est-mCnc: 111 mg/dL
Hgb A1c MFr Bld: 5.5 % (ref 4.8–5.6)

## 2023-05-16 LAB — CBC
Hematocrit: 37.8 % (ref 34.0–46.6)
Hemoglobin: 11.5 g/dL (ref 11.1–15.9)
MCH: 23.9 pg — ABNORMAL LOW (ref 26.6–33.0)
MCHC: 30.4 g/dL — ABNORMAL LOW (ref 31.5–35.7)
MCV: 79 fL (ref 79–97)
Platelets: 254 10*3/uL (ref 150–450)
RBC: 4.81 x10E6/uL (ref 3.77–5.28)
RDW: 15.1 % (ref 11.7–15.4)
WBC: 3 10*3/uL — ABNORMAL LOW (ref 3.4–10.8)

## 2023-05-16 LAB — HEPATITIS B SURFACE ANTIGEN: Hepatitis B Surface Ag: NEGATIVE

## 2023-05-16 LAB — HEPATITIS C ANTIBODY: Hep C Virus Ab: NONREACTIVE

## 2023-05-16 LAB — HIV ANTIBODY (ROUTINE TESTING W REFLEX): HIV Screen 4th Generation wRfx: NONREACTIVE

## 2023-05-17 LAB — CYTOLOGY - PAP
Comment: NEGATIVE
Comment: NEGATIVE
Comment: NEGATIVE
Diagnosis: UNDETERMINED — AB
HPV 16: NEGATIVE
HPV 18 / 45: NEGATIVE
High risk HPV: POSITIVE — AB

## 2023-05-23 ENCOUNTER — Encounter (HOSPITAL_BASED_OUTPATIENT_CLINIC_OR_DEPARTMENT_OTHER): Payer: Self-pay | Admitting: Family Medicine

## 2023-05-29 ENCOUNTER — Other Ambulatory Visit: Payer: Self-pay | Admitting: Obstetrics and Gynecology

## 2023-05-29 NOTE — Patient Outreach (Addendum)
RNCM placed a follow up call to patient at her request.  Patient states she is doing well and no current issues.  Recently seen by GYN and Vascular.  Remains on Xarelto for anticoagulation due to recurrent DVT.    Kathi Der RN, BSN [place]  [employer], [employer] - Managed Medicaid High Risk [phone]

## 2023-07-22 ENCOUNTER — Encounter (HOSPITAL_COMMUNITY): Payer: Self-pay

## 2023-07-22 ENCOUNTER — Emergency Department (HOSPITAL_COMMUNITY): Payer: Medicaid Other

## 2023-07-22 ENCOUNTER — Other Ambulatory Visit: Payer: Self-pay

## 2023-07-22 ENCOUNTER — Emergency Department (HOSPITAL_COMMUNITY)
Admission: EM | Admit: 2023-07-22 | Discharge: 2023-07-22 | Disposition: A | Discharge status: Home / Self Care | Payer: Medicaid Other | Attending: Emergency Medicine | Admitting: Emergency Medicine

## 2023-07-22 DIAGNOSIS — M79671 Pain in right foot: Secondary | ICD-10-CM | POA: Insufficient documentation

## 2023-07-22 MED ORDER — KETOROLAC TROMETHAMINE 15 MG/ML IJ SOLN
15.0000 mg | Freq: Once | INTRAMUSCULAR | Status: AC
  Administered 2023-07-22: 15 mg via INTRAMUSCULAR
  Filled 2023-07-22: qty 1

## 2023-07-22 MED ORDER — ACETAMINOPHEN 500 MG PO TABS
1000.0000 mg | ORAL_TABLET | Freq: Once | ORAL | Status: AC
  Administered 2023-07-22: 1000 mg via ORAL
  Filled 2023-07-22: qty 2

## 2023-07-22 NOTE — ED Notes (Signed)
Patient verbalizes understanding of discharge instructions. Opportunity for questioning and answers were provided. Armband removed by staff, pt discharged from ED. Pt taken to ED waiting room via wheel chair.  

## 2023-07-22 NOTE — Progress Notes (Signed)
Orthopedic Tech Progress Note Patient Details:  Andrea Hayes 1991/02/05 [id number]  Ortho Devices Type of Ortho Device: CAM walker Ortho Device/Splint Location: rle Ortho Device/Splint Interventions: Ordered, Application, Adjustment   Post Interventions Patient Tolerated: Well Instructions Provided: Care of device, Adjustment of device  [name] 07/22/2023, 11:40 PM

## 2023-07-22 NOTE — ED Notes (Signed)
Orhto paged for cam boot

## 2023-07-22 NOTE — Discharge Instructions (Addendum)
You have a 5th metatarsal fracture.  You need to keep the fracture boot on anytime you are bearing weight on the foot. He need to follow-up with orthopedics within 1 week.

## 2023-07-22 NOTE — ED Provider Notes (Signed)
[place] EMERGENCY DEPARTMENT AT [facility] Provider Note   CSN: [id number] Arrival date & time: 07/22/23  2132     History Chief Complaint  Patient presents with   Foot Pain    HPI Andrea Hayes is a 33 y.o. female presenting for right foot pain.  Ground-level fall.  States she lost her balance when walking past a post at work.  Fell over her right foot.  Immediate pain at the lateral aspect of her right medial foot.  Is able to bear weight at this time but has significant discomfort.  Denies fevers chills nausea vomiting shortness of breath.  Otherwise ambulatory tolerating p.o. intake..   Patient's recorded medical, surgical, social, medication list and allergies were reviewed in the Snapshot window as part of the initial history.   Review of Systems   Review of Systems  Constitutional:  Negative for chills and fever.  HENT:  Negative for ear pain and sore throat.   Eyes:  Negative for pain and visual disturbance.  Respiratory:  Negative for cough and shortness of breath.   Cardiovascular:  Negative for chest pain and palpitations.  Gastrointestinal:  Negative for abdominal pain and vomiting.  Genitourinary:  Negative for dysuria and hematuria.  Musculoskeletal:  Negative for arthralgias and back pain.  Skin:  Negative for color change and rash.  Neurological:  Negative for seizures and syncope.  All other systems reviewed and are negative.   Physical Exam Updated Vital Signs BP (!) 125/93 (BP Location: Left Arm)   Pulse 90   Temp 97.8 F (36.6 C) (Oral)   Resp 12   Wt 68.5 kg   SpO2 100%   BMI 26.75 kg/m  Physical Exam Constitutional:      General: She is not in acute distress.    Appearance: She is not ill-appearing or toxic-appearing.  HENT:     Head: Normocephalic and atraumatic.  Eyes:     Extraocular Movements: Extraocular movements intact.     Pupils: Pupils are equal, round, and reactive to light.  Cardiovascular:     Rate and Rhythm:  Normal rate.  Pulmonary:     Effort: No respiratory distress.  Abdominal:     General: Abdomen is flat.  Musculoskeletal:        General: No swelling, deformity or signs of injury.     Cervical back: Normal range of motion. No rigidity.  Skin:    General: Skin is warm and dry.  Neurological:     General: No focal deficit present.     Mental Status: She is alert and oriented to person, place, and time.  Psychiatric:        Mood and Affect: Mood normal.      ED Course/ Medical Decision Making/ A&P    Procedures Procedures   Medications Ordered in ED Medications  ketorolac (TORADOL) 15 MG/ML injection 15 mg (has no administration in time range)  acetaminophen (TYLENOL) tablet 1,000 mg (has no administration in time range)    Medical Decision Making:   33 year old female present with chief complaint of ground-level fall.  Right lateral foot pain. Obvious swelling at the site.  Suspect orthopedic injury. X-ray performed Reassessment: X-ray reviewed and demonstrates right fifth metatarsal fracture.  Patient placed into fracture boot with plan to follow-up with orthopedic surgery.  No acute distress at this time stable for outpatient care management.  Clinical Impression:  1. Foot pain, right      Data Unavailable   Final Clinical Impression(s) /  ED Diagnoses Final diagnoses:  Foot pain, right    Rx / DC Orders ED Discharge Orders     None         Glyn Ade, MD 07/22/23 2245

## 2023-07-22 NOTE — ED Triage Notes (Signed)
Pt arrives with c/o right foot pain after a fall. No deformity noted.

## 2023-07-24 ENCOUNTER — Ambulatory Visit (INDEPENDENT_AMBULATORY_CARE_PROVIDER_SITE_OTHER): Payer: No Typology Code available for payment source | Admitting: Orthopaedic Surgery

## 2023-07-24 ENCOUNTER — Telehealth: Payer: Self-pay | Admitting: Orthopaedic Surgery

## 2023-07-24 ENCOUNTER — Encounter: Payer: Self-pay | Admitting: Orthopaedic Surgery

## 2023-07-24 DIAGNOSIS — S92354A Nondisplaced fracture of fifth metatarsal bone, right foot, initial encounter for closed fracture: Secondary | ICD-10-CM | POA: Insufficient documentation

## 2023-07-24 NOTE — Progress Notes (Signed)
Office Visit Note   Patient: Andrea Hayes           Date of Birth: 04/15/1991           MRN: [id number] Visit Date: 07/24/2023              Requested by: Fleet Contras, MD [address] [place],  [place] [address] PCP: Fleet Contras, MD   Assessment & Plan: Visit Diagnoses:  1. Nondisplaced fracture of fifth metatarsal bone, right foot, initial encounter for closed fracture     Plan: Patient is a 33 year old female with a right fifth metatarsal Jones fracture.  Patient is very active at baseline and has a history of DVT and PE and would be at risk for redevelopment if we were to keep her nonweightbearing.  I have recommended surgical repair to allow more reliable fracture healing and to allow early mobilization and weightbearing in order to mitigate risk of DVT and PE.  Risk benefits prognosis of the surgery reviewed with the patient.  We placed her back into the boot.  Work note provided.  My office will get this surgery authorized.  Total face to face encounter time was greater than 45 minutes and over half of this time was spent in counseling and/or coordination of care.   Follow-Up Instructions: No follow-ups on file.   Orders:  No orders of the defined types were placed in this encounter.  No orders of the defined types were placed in this encounter.     Procedures: No procedures performed   Clinical Data: No additional findings.   Subjective: Chief Complaint  Patient presents with   Right Foot - Pain    HPI Patient is a 33 year old female here for evaluation Worker's Comp. injury of the right foot.  This occurred on 07/22/2023.  She was at work and fell back and twisted her foot.  She had immediate pain to the right foot.  Went to the ED and had x-rays which showed a Jones fracture.  She was placed in a cam boot.  She is here with her husband today. Review of Systems  Constitutional: Negative.   HENT: Negative.    Eyes: Negative.   Respiratory: Negative.     Cardiovascular: Negative.   Endocrine: Negative.   Musculoskeletal: Negative.   Neurological: Negative.   Hematological: Negative.   Psychiatric/Behavioral: Negative.    All other systems reviewed and are negative.    Objective: Vital Signs: There were no vitals taken for this visit.  Physical Exam Vitals and nursing note reviewed.  Constitutional:      Appearance: She is well-developed.  HENT:     Head: Atraumatic.     Nose: Nose normal.  Eyes:     Extraocular Movements: Extraocular movements intact.  Cardiovascular:     Pulses: Normal pulses.  Pulmonary:     Effort: Pulmonary effort is normal.  Abdominal:     Palpations: Abdomen is soft.  Musculoskeletal:     Cervical back: Neck supple.  Skin:    General: Skin is warm.     Capillary Refill: Capillary refill takes less than 2 seconds.  Neurological:     Mental Status: She is alert. Mental status is at baseline.  Psychiatric:        Behavior: Behavior normal.        Thought Content: Thought content normal.        Judgment: Judgment normal.     Ortho Exam Examination of right foot shows mild swelling to the  lateral aspect of the foot.  Tenderness at the base of the fifth metatarsal. Specialty Comments:  No specialty comments available.  Imaging: No results found.   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   Acute respiratory failure with hypoxia (HCC) 01/26/2021   Chronic right arterial ischemic stroke, MCA (middle  cerebral artery) 01/23/2021   Pulmonary embolism (HCC) 01/23/2021   Leukocytosis 01/23/2021   Thrombocytopenia (HCC) 01/23/2021   Elevated liver enzymes 01/23/2021   Chest pain 01/23/2021   Back pain 01/23/2021   Vaginal delivery 12/05/2020   Headache 11/28/2020   HTN (hypertension) 11/28/2020   Severe preeclampsia, delivered 11/04/2020   IUGR (intrauterine growth restriction) affecting care of mother 10/31/2020   Thrombocytopenia affecting pregnancy (HCC) 05/27/2020   Acute right arterial ischemic stroke, middle cerebral artery (MCA) (HCC)    Dural venous sinus thrombosis    Idiopathic intracranial hypertension    Cerebral venous thrombosis 07/24/2018   Past Medical History:  Diagnosis Date   DVT (deep venous thrombosis) (HCC)     Family History  Problem Relation Age of Onset   Asthma Father    Obesity Neg Hx    Hypertension Neg Hx    Diabetes Neg Hx    Cancer Neg Hx     Past Surgical History:  Procedure Laterality Date   BIOPSY  04/05/2021   Procedure: BIOPSY;  Surgeon: Jenel Lucks, MD;  Location: [facility] ENDOSCOPY;  Service: Gastroenterology;;   COLONOSCOPY N/A 04/05/2021   Procedure: COLONOSCOPY;  Surgeon: Jenel Lucks, MD;  Location: [facility] ENDOSCOPY;  Service: Gastroenterology;  Laterality: N/A;   DILATION AND EVACUATION N/A 09/25/2022   Procedure: DILATATION AND EVACUATION;  Surgeon: Adam Phenix, MD;  Location: [facility] OR;  Service: Gynecology;  Laterality: N/A;   ESOPHAGOGASTRODUODENOSCOPY N/A 04/05/2021   Procedure: ESOPHAGOGASTRODUODENOSCOPY (EGD);  Surgeon: Jenel Lucks, MD;  Location: [facility] ENDOSCOPY;  Service: Gastroenterology;  Laterality: N/A;   LOWER EXTREMITY VENOGRAPHY Right 01/22/2022   Procedure: LOWER EXTREMITY VENOGRAPHY;  Surgeon: Leonie Douglas, MD;  Location: [facility] INVASIVE CV LAB;  Service: Cardiovascular;  Laterality: Right;   PERIPHERAL VASCULAR BALLOON ANGIOPLASTY  03/24/2021   Procedure: PERIPHERAL VASCULAR BALLOON ANGIOPLASTY;  Surgeon:  Leonie Douglas, MD;  Location: [facility] INVASIVE CV LAB;  Service: Cardiovascular;;  bilateral common iliacs   PERIPHERAL VASCULAR THROMBECTOMY N/A 03/23/2021   Procedure: PERIPHERAL VASCULAR THROMBECTOMY;  Surgeon: Maeola Harman, MD;  Location: [facility] INVASIVE CV LAB;  Service: Cardiovascular;  Laterality: N/A;   PERIPHERAL VASCULAR THROMBECTOMY N/A 03/24/2021   Procedure: LYSIS RECHECK;  Surgeon: Leonie Douglas, MD;  Location: [facility] INVASIVE CV LAB;  Service: Cardiovascular;  Laterality: N/A;   Social History   Occupational History   Not on file  Tobacco Use   Smoking status: Never    Passive exposure: Never   Smokeless tobacco: Never  Vaping Use   Vaping status: Never Used  Substance and Sexual Activity   Alcohol use: Never   Drug use: Never   Sexual activity: Yes  Birth control/protection: None

## 2023-07-24 NOTE — Telephone Encounter (Signed)
Letter created and sent via MyChart, per patient's request.

## 2023-07-24 NOTE — Telephone Encounter (Signed)
Yes she can.

## 2023-07-24 NOTE — Telephone Encounter (Signed)
Patient states her employer is trying to push her to go back to work but she want to go ahead and get her foot taken care of. The employer and wanting her to get another note to say she can do a job sitting.

## 2023-07-30 ENCOUNTER — Telehealth: Payer: Self-pay

## 2023-07-30 ENCOUNTER — Encounter: Payer: Self-pay | Admitting: Orthopaedic Surgery

## 2023-07-30 ENCOUNTER — Ambulatory Visit (INDEPENDENT_AMBULATORY_CARE_PROVIDER_SITE_OTHER): Payer: No Typology Code available for payment source | Admitting: Orthopaedic Surgery

## 2023-07-30 DIAGNOSIS — S92354A Nondisplaced fracture of fifth metatarsal bone, right foot, initial encounter for closed fracture: Secondary | ICD-10-CM

## 2023-07-30 NOTE — Telephone Encounter (Signed)
Patient was in this morning. She was telling Dr.Xu that Workers Comp has approved her surgery. Have you heard anything? Dr.Xu wants to know.  Thanks

## 2023-07-30 NOTE — Progress Notes (Signed)
Office Visit Note   Patient: Andrea Hayes           Date of Birth: 31-Jan-1991           MRN: [id number] Visit Date: 07/30/2023              Requested by: Fleet Contras, MD [address] [place],  [place] [address] PCP: Fleet Contras, MD   Assessment & Plan: Visit Diagnoses:  1. Nondisplaced fracture of fifth metatarsal bone, right foot, initial encounter for closed fracture     Plan: Patient is a 33 year old female with a right foot Jones fracture.  Based on findings recommendations for surgical repair with intramedullary screw.  Again we discussed risk benefits prognosis.  Questions encouraged and answered.  We are still awaiting official approval by [employer]. for the surgery.  Eunice Blase will reach out to the patient to confirm surgery date once we have this.  Total face to face encounter time was greater than 25 minutes and over half of this time was spent in counseling and/or coordination of care.  Follow-Up Instructions: No follow-ups on file.   Orders:  No orders of the defined types were placed in this encounter.  No orders of the defined types were placed in this encounter.     Procedures: No procedures performed   Clinical Data: No additional findings.   Subjective: Chief Complaint  Patient presents with  . Right Foot - Pain    HPI Patient comes in today for follow-up evaluation of right foot injury.  This has been approved by [employer]. Review of Systems  Constitutional: Negative.   HENT: Negative.    Eyes: Negative.   Respiratory: Negative.    Cardiovascular: Negative.   Endocrine: Negative.   Musculoskeletal: Negative.   Neurological: Negative.   Hematological: Negative.   Psychiatric/Behavioral: Negative.    All other systems reviewed and are negative.    Objective: Vital Signs: There were no vitals taken for this visit.  Physical Exam Vitals and nursing note reviewed.  Constitutional:      Appearance: She is  well-developed.  HENT:     Head: Normocephalic and atraumatic.     Nose: Nose normal.  Eyes:     Extraocular Movements: Extraocular movements intact.  Cardiovascular:     Pulses: Normal pulses.  Pulmonary:     Effort: Pulmonary effort is normal.  Abdominal:     Palpations: Abdomen is soft.  Musculoskeletal:     Cervical back: Neck supple.  Skin:    General: Skin is warm.     Capillary Refill: Capillary refill takes less than 2 seconds.  Neurological:     Mental Status: She is alert and oriented to person, place, and time. Mental status is at baseline.  Psychiatric:        Behavior: Behavior normal.        Thought Content: Thought content normal.        Judgment: Judgment normal.    Ortho Exam Examination of right foot is unchanged from prior visit. Specialty Comments:  No specialty comments available.  Imaging: No results found.   PMFS History: Patient Active Problem List   Diagnosis Date Noted  . Nondisplaced fracture of fifth metatarsal bone, right foot, initial encounter for closed fracture 07/24/2023  . Incomplete abortion 09/25/2022  . Acute DVT (deep venous thrombosis) (HCC) 01/21/2022  . History of recurrent deep vein thrombosis (DVT) 09/26/2021  . Fecal occult blood test positive   . Anemia   . Lower extremity  pain, anterior, left 04/01/2021  . Abdominal pain 03/31/2021  . DVT (deep venous thrombosis) (HCC) 03/22/2021  . 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 Acute respiratory failure with hypoxia (HCC) 01/26/2021  . Chronic right arterial ischemic stroke, MCA (middle cerebral artery) 01/23/2021  . Pulmonary embolism (HCC) 01/23/2021  . Leukocytosis 01/23/2021  . Thrombocytopenia (HCC) 01/23/2021  . Elevated liver enzymes 01/23/2021  . Chest pain 01/23/2021  . Back pain 01/23/2021  . Vaginal delivery 12/05/2020  . Headache 11/28/2020  . HTN (hypertension) 11/28/2020  . Severe preeclampsia, delivered 11/04/2020  . IUGR (intrauterine growth restriction)  affecting care of mother 10/31/2020  . Thrombocytopenia affecting pregnancy (HCC) 05/27/2020  . Acute right arterial ischemic stroke, middle cerebral artery (MCA) (HCC)   . Dural venous sinus thrombosis   . Idiopathic intracranial hypertension   . Cerebral venous thrombosis 07/24/2018   Past Medical History:  Diagnosis Date  . DVT (deep venous thrombosis) (HCC)     Family History  Problem Relation Age of Onset  . Asthma Father   . Obesity Neg Hx   . Hypertension Neg Hx   . Diabetes Neg Hx   . Cancer Neg Hx     Past Surgical History:  Procedure Laterality Date  . BIOPSY  04/05/2021   Procedure: BIOPSY;  Surgeon: Jenel Lucks, MD;  Location: [facility] ENDOSCOPY;  Service: Gastroenterology;;  . COLONOSCOPY N/A 04/05/2021   Procedure: COLONOSCOPY;  Surgeon: Jenel Lucks, MD;  Location: [facility] ENDOSCOPY;  Service: Gastroenterology;  Laterality: N/A;  . DILATION AND EVACUATION N/A 09/25/2022   Procedure: DILATATION AND EVACUATION;  Surgeon: Adam Phenix, MD;  Location: [facility] OR;  Service: Gynecology;  Laterality: N/A;  . ESOPHAGOGASTRODUODENOSCOPY N/A 04/05/2021   Procedure: ESOPHAGOGASTRODUODENOSCOPY (EGD);  Surgeon: Jenel Lucks, MD;  Location: [facility] ENDOSCOPY;  Service: Gastroenterology;  Laterality: N/A;  . LOWER EXTREMITY VENOGRAPHY Right 01/22/2022   Procedure: LOWER EXTREMITY VENOGRAPHY;  Surgeon: Leonie Douglas, MD;  Location: [facility] INVASIVE CV LAB;  Service: Cardiovascular;  Laterality: Right;  . PERIPHERAL VASCULAR BALLOON ANGIOPLASTY  03/24/2021   Procedure: PERIPHERAL VASCULAR BALLOON ANGIOPLASTY;  Surgeon: Leonie Douglas, MD;  Location: [facility] INVASIVE CV LAB;  Service: Cardiovascular;;  bilateral common iliacs  . PERIPHERAL VASCULAR THROMBECTOMY N/A 03/23/2021   Procedure: PERIPHERAL VASCULAR THROMBECTOMY;  Surgeon: Maeola Harman, MD;  Location: [facility] INVASIVE CV LAB;  Service: Cardiovascular;  Laterality: N/A;  . PERIPHERAL VASCULAR THROMBECTOMY N/A 03/24/2021    Procedure: LYSIS RECHECK;  Surgeon: Leonie Douglas, MD;  Location: [facility] INVASIVE CV LAB;  Service: Cardiovascular;  Laterality: N/A;   Social History   Occupational History  . Not on file  Tobacco Use  . Smoking status: Never    Passive exposure: Never  . Smokeless tobacco: Never  Vaping Use  . Vaping status: Never Used  Substance and Sexual Activity  . Alcohol use: Never  . Drug use: Never  . Sexual activity: Yes    Birth control/protection: None

## 2023-08-07 NOTE — Progress Notes (Signed)
 Fax received from [facility] on 07/25/23 for medical clearance/medication hold for ORIF right 5th metatarsal fracture to be signed by T. Magda, MD.  Provider signed on 07/27/23, scanned into pt's chart, faxed back to sender on 07/30/23, media routed via MyChart to sender on 08/07/23.

## 2023-08-08 ENCOUNTER — Encounter (HOSPITAL_BASED_OUTPATIENT_CLINIC_OR_DEPARTMENT_OTHER): Payer: Self-pay | Admitting: Orthopaedic Surgery

## 2023-08-08 ENCOUNTER — Other Ambulatory Visit: Payer: Self-pay

## 2023-08-08 NOTE — Progress Notes (Signed)
   08/08/23 0912  PAT Phone Screen  Is the patient taking a GLP-1 receptor agonist? No  Do You Have Diabetes? No  Do You Have Hypertension? No  Have You Ever Been to the ER for Asthma? No  Have You Taken Oral Steroids in the Past 3 Months? No  Do you Take Phenteramine or any Other Diet Drugs? No  Recent  Lab Work, EKG, CXR? Yes  Where was this test performed? EKG4/16/24 [facility]  Do you have a history of heart problems? No  Have you ever had tests on your heart? Yes  What cardiac tests were performed? Echo  What date/year were cardiac tests completed? 12/2020 Normal anatomy EF 60 65%  Results viewable: [facility] Media Tab  Any Recent Hospitalizations? No  Height 5' 3 (1.6 m)  Weight 68.5 kg  Pat Appointment Scheduled No  Reason for No Appointment Not Needed   PT's history of thrombophilia, PE, Stroke and recurrent DVT's reviewed with Dr Keneth. Vascular surgery clearance and Xarelto  hold on chart. No further testing or clearance needed.

## 2023-08-12 ENCOUNTER — Other Ambulatory Visit: Payer: Self-pay | Admitting: Physician Assistant

## 2023-08-12 MED ORDER — HYDROCODONE-ACETAMINOPHEN 5-325 MG PO TABS: 1.0000 | ORAL_TABLET | Freq: Three times a day (TID) | ORAL | 0 refills | Status: AC | PRN

## 2023-08-12 MED ORDER — ONDANSETRON HCL 4 MG PO TABS: 4.0000 mg | ORAL_TABLET | Freq: Three times a day (TID) | ORAL | 0 refills | Status: AC | PRN

## 2023-08-13 NOTE — Anesthesia Preprocedure Evaluation (Signed)
Anesthesia Evaluation  Patient identified by MRN, date of birth, ID band Patient awake    Reviewed: Allergy & Precautions, NPO status , Patient's Chart, lab work & pertinent test results  Airway Mallampati: II  TM Distance: >3 FB Neck ROM: Full    Dental no notable dental hx. (+) Teeth Intact, Dental Advisory Given   Pulmonary neg pulmonary ROS   Pulmonary exam normal breath sounds clear to auscultation       Cardiovascular hypertension, + DVT (on xarelto)  Normal cardiovascular exam Rhythm:Regular Rate:Normal     Neuro/Psych  Headaches  negative psych ROS   GI/Hepatic Neg liver ROS,GERD  ,,  Endo/Other  negative endocrine ROS    Renal/GU negative Renal ROS     Musculoskeletal negative musculoskeletal ROS (+)    Abdominal   Peds  Hematology   Anesthesia Other Findings   Reproductive/Obstetrics negative OB ROS                             Anesthesia Physical Anesthesia Plan  ASA: 2  Anesthesia Plan: MAC   Post-op Pain Management: Regional block* and Tylenol PO (pre-op)*   Induction:   PONV Risk Score and Plan: 2 and Treatment may vary due to age or medical condition and Midazolam  Airway Management Planned: Natural Airway and Nasal Cannula  Additional Equipment: None  Intra-op Plan:   Post-operative Plan:   Informed Consent: I have reviewed the patients History and Physical, chart, labs and discussed the procedure including the risks, benefits and alternatives for the proposed anesthesia with the patient or authorized representative who has indicated his/her understanding and acceptance.     Dental advisory given  Plan Discussed with: CRNA and Surgeon  Anesthesia Plan Comments: (R Popliteal block)       Anesthesia Quick Evaluation

## 2023-08-14 ENCOUNTER — Encounter (HOSPITAL_BASED_OUTPATIENT_CLINIC_OR_DEPARTMENT_OTHER): Payer: Self-pay | Admitting: Orthopaedic Surgery

## 2023-08-14 ENCOUNTER — Ambulatory Visit (HOSPITAL_BASED_OUTPATIENT_CLINIC_OR_DEPARTMENT_OTHER): Payer: Self-pay | Admitting: Anesthesiology

## 2023-08-14 ENCOUNTER — Other Ambulatory Visit: Payer: Self-pay

## 2023-08-14 ENCOUNTER — Ambulatory Visit (HOSPITAL_BASED_OUTPATIENT_CLINIC_OR_DEPARTMENT_OTHER): Payer: Medicaid Other | Admitting: Anesthesiology

## 2023-08-14 ENCOUNTER — Encounter (HOSPITAL_BASED_OUTPATIENT_CLINIC_OR_DEPARTMENT_OTHER)
Admission: RE | Disposition: A | Discharge status: Home / Self Care | Payer: Self-pay | Source: Home / Self Care | Attending: Orthopaedic Surgery

## 2023-08-14 ENCOUNTER — Ambulatory Visit (HOSPITAL_BASED_OUTPATIENT_CLINIC_OR_DEPARTMENT_OTHER)
Admission: RE | Admit: 2023-08-14 | Discharge: 2023-08-14 | Disposition: A | Discharge status: Home / Self Care | Payer: Medicaid Other | Attending: Orthopaedic Surgery | Admitting: Orthopaedic Surgery

## 2023-08-14 ENCOUNTER — Ambulatory Visit (HOSPITAL_COMMUNITY): Payer: Self-pay

## 2023-08-14 DIAGNOSIS — K219 Gastro-esophageal reflux disease without esophagitis: Secondary | ICD-10-CM | POA: Diagnosis not present

## 2023-08-14 DIAGNOSIS — S92354A Nondisplaced fracture of fifth metatarsal bone, right foot, initial encounter for closed fracture: Secondary | ICD-10-CM

## 2023-08-14 DIAGNOSIS — I10 Essential (primary) hypertension: Secondary | ICD-10-CM | POA: Insufficient documentation

## 2023-08-14 DIAGNOSIS — Z86718 Personal history of other venous thrombosis and embolism: Secondary | ICD-10-CM | POA: Diagnosis not present

## 2023-08-14 DIAGNOSIS — S92351A Displaced fracture of fifth metatarsal bone, right foot, initial encounter for closed fracture: Secondary | ICD-10-CM

## 2023-08-14 DIAGNOSIS — Z01818 Encounter for other preprocedural examination: Secondary | ICD-10-CM

## 2023-08-14 DIAGNOSIS — Z7901 Long term (current) use of anticoagulants: Secondary | ICD-10-CM | POA: Insufficient documentation

## 2023-08-14 DIAGNOSIS — X58XXXA Exposure to other specified factors, initial encounter: Secondary | ICD-10-CM | POA: Diagnosis not present

## 2023-08-14 HISTORY — PX: ORIF TOE FRACTURE: SHX5032

## 2023-08-14 HISTORY — DX: Personal history of pulmonary embolism: Z86.711

## 2023-08-14 HISTORY — DX: Severe pre-eclampsia, unspecified trimester: O14.10

## 2023-08-14 HISTORY — DX: Anemia, unspecified: D64.9

## 2023-08-14 HISTORY — DX: Other primary thrombophilia: D68.59

## 2023-08-14 HISTORY — DX: Benign intracranial hypertension: G93.2

## 2023-08-14 LAB — POCT PREGNANCY, URINE: Preg Test, Ur: NEGATIVE

## 2023-08-14 SURGERY — OPEN REDUCTION INTERNAL FIXATION (ORIF) METATARSAL (TOE) FRACTURE
Anesthesia: Monitor Anesthesia Care | Site: Toe | Laterality: Right

## 2023-08-14 MED ORDER — 0.9 % SODIUM CHLORIDE (POUR BTL) OPTIME
TOPICAL | Status: DC | PRN
  Administered 2023-08-14: 200 mL

## 2023-08-14 MED ORDER — MIDAZOLAM HCL 2 MG/2ML IJ SOLN
2.0000 mg | Freq: Once | INTRAMUSCULAR | Status: AC
  Administered 2023-08-14: 2 mg via INTRAVENOUS

## 2023-08-14 MED ORDER — LIDOCAINE 2% (20 MG/ML) 5 ML SYRINGE
INTRAMUSCULAR | Status: DC | PRN
  Administered 2023-08-14: 20 mg via INTRAVENOUS

## 2023-08-14 MED ORDER — HYDROMORPHONE HCL 1 MG/ML IJ SOLN: 0.2500 mg | INTRAMUSCULAR | Status: DC | PRN

## 2023-08-14 MED ORDER — MIDAZOLAM HCL 2 MG/2ML IJ SOLN
INTRAMUSCULAR | Status: AC
  Filled 2023-08-14: qty 2

## 2023-08-14 MED ORDER — CEFAZOLIN SODIUM-DEXTROSE 2-4 GM/100ML-% IV SOLN
2.0000 g | INTRAVENOUS | Status: AC
  Administered 2023-08-14: 2 g via INTRAVENOUS

## 2023-08-14 MED ORDER — CEFAZOLIN SODIUM-DEXTROSE 2-4 GM/100ML-% IV SOLN
INTRAVENOUS | Status: AC
  Filled 2023-08-14: qty 100

## 2023-08-14 MED ORDER — PROPOFOL 500 MG/50ML IV EMUL
INTRAVENOUS | Status: DC | PRN
  Administered 2023-08-14: 150 ug/kg/min via INTRAVENOUS

## 2023-08-14 MED ORDER — LACTATED RINGERS IV SOLN: INTRAVENOUS | Status: DC

## 2023-08-14 MED ORDER — PHENYLEPHRINE 80 MCG/ML (10ML) SYRINGE FOR IV PUSH (FOR BLOOD PRESSURE SUPPORT)
PREFILLED_SYRINGE | INTRAVENOUS | Status: AC
  Filled 2023-08-14: qty 10

## 2023-08-14 MED ORDER — OXYCODONE HCL 5 MG/5ML PO SOLN: 5.0000 mg | Freq: Once | ORAL | Status: DC | PRN

## 2023-08-14 MED ORDER — ONDANSETRON HCL 4 MG/2ML IJ SOLN: 4.0000 mg | Freq: Once | INTRAMUSCULAR | Status: DC | PRN

## 2023-08-14 MED ORDER — SODIUM CHLORIDE 0.9 % IV SOLN: INTRAVENOUS | Status: DC | PRN

## 2023-08-14 MED ORDER — KETOROLAC TROMETHAMINE 30 MG/ML IJ SOLN: 30.0000 mg | Freq: Once | INTRAMUSCULAR | Status: DC | PRN

## 2023-08-14 MED ORDER — FENTANYL CITRATE (PF) 100 MCG/2ML IJ SOLN
100.0000 ug | Freq: Once | INTRAMUSCULAR | Status: AC
  Administered 2023-08-14: 100 ug via INTRAVENOUS

## 2023-08-14 MED ORDER — DEXMEDETOMIDINE HCL IN NACL 80 MCG/20ML IV SOLN
INTRAVENOUS | Status: AC
  Filled 2023-08-14: qty 20

## 2023-08-14 MED ORDER — PHENYLEPHRINE 80 MCG/ML (10ML) SYRINGE FOR IV PUSH (FOR BLOOD PRESSURE SUPPORT)
PREFILLED_SYRINGE | INTRAVENOUS | Status: DC | PRN
Start: 2023-08-14 — End: 2023-08-14
  Administered 2023-08-14: 80 ug via INTRAVENOUS
  Administered 2023-08-14 (×2): 40 ug via INTRAVENOUS

## 2023-08-14 MED ORDER — OXYCODONE HCL 5 MG PO TABS: 5.0000 mg | ORAL_TABLET | Freq: Once | ORAL | Status: DC | PRN

## 2023-08-14 MED ORDER — LIDOCAINE 2% (20 MG/ML) 5 ML SYRINGE
INTRAMUSCULAR | Status: AC
  Filled 2023-08-14: qty 5

## 2023-08-14 MED ORDER — FENTANYL CITRATE (PF) 100 MCG/2ML IJ SOLN
INTRAMUSCULAR | Status: AC
  Filled 2023-08-14: qty 2

## 2023-08-14 MED ORDER — PROPOFOL 10 MG/ML IV BOLUS
INTRAVENOUS | Status: AC
  Filled 2023-08-14: qty 20

## 2023-08-14 SURGICAL SUPPLY — 58 items
BANDAGE ESMARK 6X9 LF (GAUZE/BANDAGES/DRESSINGS) ×1 IMPLANT
BIT DRILL SRG 3.5XCAN FFTH MTR (BIT) IMPLANT
BIT DRL SRG 3.5XCANN FIFTH MTR (BIT) ×1 IMPLANT
BLADE HEX COATED 2.75 (ELECTRODE) ×1 IMPLANT
BLADE SURG 15 STRL LF DISP TIS (BLADE) ×2 IMPLANT
BNDG COHESIVE 4X5 TAN STRL LF (GAUZE/BANDAGES/DRESSINGS) ×1 IMPLANT
BNDG ELASTIC 4INX 5YD STR LF (GAUZE/BANDAGES/DRESSINGS) IMPLANT
BNDG ESMARK 6X9 LF (GAUZE/BANDAGES/DRESSINGS) ×1 IMPLANT
CANISTER SUCT 1200ML W/VALVE (MISCELLANEOUS) ×1 IMPLANT
COVER BACK TABLE 60X90IN (DRAPES) ×1 IMPLANT
CUFF TRNQT CYL 34X4.125X (TOURNIQUET CUFF) IMPLANT
DRAPE C-ARM 42X72 X-RAY (DRAPES) ×1 IMPLANT
DRAPE C-ARMOR (DRAPES) ×1 IMPLANT
DRAPE EXTREMITY T 121X128X90 (DISPOSABLE) ×1 IMPLANT
DRAPE IMP U-DRAPE 54X76 (DRAPES) ×1 IMPLANT
DRAPE OEC MINIVIEW 54X84 (DRAPES) ×1 IMPLANT
DRAPE SURG 17X23 STRL (DRAPES) ×2 IMPLANT
DRAPE U-SHAPE 47X51 STRL (DRAPES) ×1 IMPLANT
DURAPREP 26ML APPLICATOR (WOUND CARE) ×1 IMPLANT
ELECT REM PT RETURN 9FT ADLT (ELECTROSURGICAL) ×1 IMPLANT
ELECTRODE REM PT RTRN 9FT ADLT (ELECTROSURGICAL) ×1 IMPLANT
GAUZE PAD ABD 8X10 STRL (GAUZE/BANDAGES/DRESSINGS) ×2 IMPLANT
GAUZE SPONGE 4X4 12PLY STRL (GAUZE/BANDAGES/DRESSINGS) ×1 IMPLANT
GAUZE XEROFORM 1X8 LF (GAUZE/BANDAGES/DRESSINGS) ×1 IMPLANT
GLOVE BIOGEL PI IND STRL 7.5 (GLOVE) ×1 IMPLANT
GLOVE ECLIPSE 7.0 STRL STRAW (GLOVE) ×1 IMPLANT
GLOVE INDICATOR 7.0 STRL GRN (GLOVE) ×1 IMPLANT
GLOVE SURG SYN 7.5 E (GLOVE) ×1 IMPLANT
GLOVE SURG SYN 7.5 PF PI (GLOVE) ×1 IMPLANT
GOWN STRL REUS W/ TWL LRG LVL3 (GOWN DISPOSABLE) ×1 IMPLANT
GOWN STRL REUS W/ TWL XL LVL3 (GOWN DISPOSABLE) ×1 IMPLANT
GOWN STRL SURGICAL XL XLNG (GOWN DISPOSABLE) ×1 IMPLANT
GUIDEWIRE .07X8 (WIRE) IMPLANT
NDL HYPO 22X1.5 SAFETY MO (MISCELLANEOUS) IMPLANT
NEEDLE HYPO 22X1.5 SAFETY MO (MISCELLANEOUS) IMPLANT
NS IRRIG 1000ML POUR BTL (IV SOLUTION) ×1 IMPLANT
PACK BASIN DAY SURGERY FS (CUSTOM PROCEDURE TRAY) ×1 IMPLANT
PAD CAST 3X4 CTTN HI CHSV (CAST SUPPLIES) IMPLANT
PAD CAST 4YDX4 CTTN HI CHSV (CAST SUPPLIES) IMPLANT
PADDING CAST SYNTHETIC 4X4 STR (CAST SUPPLIES) IMPLANT
PADDING CAST SYNTHETIC 6X4 NS (CAST SUPPLIES) IMPLANT
PENCIL SMOKE EVACUATOR (MISCELLANEOUS) ×1 IMPLANT
SCREW LN PT 40X4.5XST SLD (Screw) IMPLANT
SHEET MEDIUM DRAPE 40X70 STRL (DRAPES) ×2 IMPLANT
SLEEVE SCD COMPRESS KNEE MED (STOCKING) ×1 IMPLANT
SPIKE FLUID TRANSFER (MISCELLANEOUS) IMPLANT
SPONGE T-LAP 18X18 ~~LOC~~+RFID (SPONGE) ×1 IMPLANT
SUCTION TUBE FRAZIER 10FR DISP (SUCTIONS) ×1 IMPLANT
SUT ETHILON 3 0 PS 1 (SUTURE) IMPLANT
SUT VIC AB 0 CT1 27XBRD ANBCTR (SUTURE) IMPLANT
SUT VIC AB 2-0 CT1 TAPERPNT 27 (SUTURE) ×1 IMPLANT
SYR BULB EAR ULCER 3OZ GRN STR (SYRINGE) ×1 IMPLANT
SYR CONTROL 10ML LL (SYRINGE) IMPLANT
TAP BONE CANN 4.5MM (BIT) IMPLANT
TOWEL GREEN STERILE FF (TOWEL DISPOSABLE) ×1 IMPLANT
TUBE CONNECTING 20X1/4 (TUBING) ×1 IMPLANT
UNDERPAD 30X36 HEAVY ABSORB (UNDERPADS AND DIAPERS) ×1 IMPLANT
YANKAUER SUCT BULB TIP NO VENT (SUCTIONS) ×1 IMPLANT

## 2023-08-14 NOTE — H&P (Signed)
PREOPERATIVE H&P  Chief Complaint: right 5th metatarsal fracture  HPI: Andrea Hayes is a 33 y.o. female who presents for surgical treatment of right 5th metatarsal fracture.  She denies any changes in medical history.  Past Surgical History:  Procedure Laterality Date   BIOPSY  04/05/2021   Procedure: BIOPSY;  Surgeon: Jenel Lucks, MD;  Location: [facility] ENDOSCOPY;  Service: Gastroenterology;;   COLONOSCOPY N/A 04/05/2021   Procedure: COLONOSCOPY;  Surgeon: Jenel Lucks, MD;  Location: [facility] ENDOSCOPY;  Service: Gastroenterology;  Laterality: N/A;   DILATION AND EVACUATION N/A 09/25/2022   Procedure: DILATATION AND EVACUATION;  Surgeon: Adam Phenix, MD;  Location: [facility] OR;  Service: Gynecology;  Laterality: N/A;   ESOPHAGOGASTRODUODENOSCOPY N/A 04/05/2021   Procedure: ESOPHAGOGASTRODUODENOSCOPY (EGD);  Surgeon: Jenel Lucks, MD;  Location: [facility] ENDOSCOPY;  Service: Gastroenterology;  Laterality: N/A;   LOWER EXTREMITY VENOGRAPHY Right 01/22/2022   Procedure: LOWER EXTREMITY VENOGRAPHY;  Surgeon: Leonie Douglas, MD;  Location: [facility] INVASIVE CV LAB;  Service: Cardiovascular;  Laterality: Right;   PERIPHERAL VASCULAR BALLOON ANGIOPLASTY  03/24/2021   Procedure: PERIPHERAL VASCULAR BALLOON ANGIOPLASTY;  Surgeon: Leonie Douglas, MD;  Location: [facility] INVASIVE CV LAB;  Service: Cardiovascular;;  bilateral common iliacs   PERIPHERAL VASCULAR THROMBECTOMY N/A 03/23/2021   Procedure: PERIPHERAL VASCULAR THROMBECTOMY;  Surgeon: Maeola Harman, MD;  Location: [facility] INVASIVE CV LAB;  Service: Cardiovascular;  Laterality: N/A;   PERIPHERAL VASCULAR THROMBECTOMY N/A 03/24/2021   Procedure: LYSIS RECHECK;  Surgeon: Leonie Douglas, MD;  Location: [facility] INVASIVE CV LAB;  Service: Cardiovascular;  Laterality: N/A;   Social History   Socioeconomic History   Marital status: Single    Spouse name: Not on file   Number of children: Not on file   Years of education: Not on file    Highest education level: Not on file  Occupational History   Not on file  Tobacco Use   Smoking status: Never    Passive exposure: Never   Smokeless tobacco: Never  Vaping Use   Vaping status: Never Used  Substance and Sexual Activity   Alcohol use: Never   Drug use: Never   Sexual activity: Yes    Birth control/protection: None  Other Topics Concern   Not on file  Social History Narrative   Right handed   Social Drivers of Health   Financial Resource Strain: Low Risk  (06/06/2021)   Overall Financial Resource Strain (CARDIA)    Difficulty of Paying Living Expenses: Not very hard  Food Insecurity: No Food Insecurity (06/06/2021)   Hunger Vital Sign    Worried About Running Out of Food in the Last Year: Never true    Ran Out of Food in the Last Year: Never true  Transportation Needs: No Transportation Needs (03/01/2023)   PRAPARE - Administrator, Civil Service (Medical): No    Lack of Transportation (Non-Medical): No  Physical Activity: Inactive (06/06/2021)   Exercise Vital Sign    Days of Exercise per Week: 0 days    Minutes of Exercise per Session: 0 min  Stress: No Stress Concern Present (06/06/2021)   [employer] of Occupational Health - Occupational Stress Questionnaire    Feeling of Stress : Only a little  Social Connections: Unknown (11/01/2021)   Received from [facility]   Social Network    Social Network: Not on file   Family History  Problem Relation Age of Onset   Asthma Father  Obesity Neg Hx    Hypertension Neg Hx    Diabetes Neg Hx    Cancer Neg Hx    No Known Allergies Prior to Admission medications   Medication Sig Start Date End Date Taking? Authorizing Provider  HYDROcodone-acetaminophen (NORCO/VICODIN) 5-325 MG tablet Take 1 tablet by mouth 3 (three) times daily as needed for moderate pain (pain score 4-6). To be taken after surgery 08/12/23   Cristie Hem, PA-C  ondansetron (ZOFRAN) 4 MG tablet Take 1 tablet  (4 mg total) by mouth every 8 (eight) hours as needed for nausea or vomiting. 08/12/23   Cristie Hem, PA-C  Prenatal Vit-Fe Fumarate-FA (PREPLUS) 27-1 MG TABS Take 1 tablet by mouth daily. 05/14/23  Yes Constant, Peggy, MD  rivaroxaban (XARELTO) 20 MG TABS tablet Take 1 tablet (20 mg total) by mouth daily with supper. 04/02/23  Yes Leonie Douglas, MD  Vitamin D, Ergocalciferol, (DRISDOL) 1.25 MG (50000 UNIT) CAPS capsule Take 50,000 Units by mouth once a week. 01/04/23  Yes [provider]  furosemide (LASIX) 20 MG tablet Take 20 mg by mouth daily as needed. Patient not taking: Reported on 08/08/2023 01/22/23   [provider]     Positive ROS: All other systems have been reviewed and were otherwise negative with the exception of those mentioned in the HPI and as above.  Physical Exam: General: Alert, no acute distress Cardiovascular: No pedal edema Respiratory: No cyanosis, no use of accessory musculature GI: abdomen soft Skin: No lesions in the area of chief complaint Neurologic: Sensation intact distally Psychiatric: Patient is competent for consent with normal mood and affect Lymphatic: no lymphedema  MUSCULOSKELETAL: exam stable  Assessment: right 5th metatarsal fracture  Plan: Plan for Procedure(s): OPEN REDUCTION INTERNAL FIXATION (ORIF) 5TH METATARSAL (TOE) FRACTURE  The risks benefits and alternatives were discussed with the patient including but not limited to the risks of nonoperative treatment, versus surgical intervention including infection, bleeding, nerve injury,  blood clots, cardiopulmonary complications, morbidity, mortality, among others, and they were willing to proceed.   Glee Arvin, MD 08/14/2023 10:44 AM

## 2023-08-14 NOTE — Transfer of Care (Signed)
Immediate Anesthesia Transfer of Care Note  Patient: Andrea Hayes  Procedure(s) Performed: OPEN REDUCTION INTERNAL FIXATION (ORIF) 5TH METATARSAL (TOE) FRACTURE (Right: Toe)  Patient Location: PACU  Anesthesia Type:MAC  Level of Consciousness: awake  Airway & Oxygen Therapy: Patient Spontanous Breathing and Patient connected to face mask oxygen  Post-op Assessment: Report given to RN and Post -op Vital signs reviewed and stable  Post vital signs: Reviewed and stable  Last Vitals:  Vitals Value Taken Time  BP 102/65 08/14/23 1413  Temp    Pulse 81 08/14/23 1413  Resp 16 08/14/23 1413  SpO2 100 % 08/14/23 1413    Last Pain:  Vitals:   08/14/23 1059  TempSrc: Temporal  PainSc: 10-Worst pain ever      Patients Stated Pain Goal: 5 (08/14/23 1059)  Complications: No notable events documented.

## 2023-08-14 NOTE — Discharge Instructions (Addendum)
Postoperative instructions:  Resume xarelto on postoperative day 1 to prevent blood clots.  Weightbearing instructions: non weight bearing  Keep your dressing and/or splint clean and dry at all times.  You can remove your dressing on post-operative day #3 and change with a dry/sterile dressing or Band-Aids as needed thereafter.    Incision instructions:  Do not soak your incision for 3 weeks after surgery.  If the incision gets wet, pat dry and do not scrub the incision.  Pain control:  You have been given a prescription to be taken as directed for post-operative pain control.  In addition, elevate the operative extremity above the heart at all times to prevent swelling and throbbing pain.  Take over-the-counter Colace, 100mg  by mouth twice a day while taking narcotic pain medications to help prevent constipation.  Follow up appointments: 1) 14 days for suture removal and wound check. 2) Dr. Roda Shutters as scheduled.   -------------------------------------------------------------------------------------------------------------  After Surgery Pain Control:  After your surgery, post-surgical discomfort or pain is likely. This discomfort can last several days to a few weeks. At certain times of the day your discomfort may be more intense.  Did you receive a nerve block?  A nerve block can provide pain relief for one hour to two days after your surgery. As long as the nerve block is working, you will experience little or no sensation in the area the surgeon operated on.  As the nerve block wears off, you will begin to experience pain or discomfort. It is very important that you begin taking your prescribed pain medication before the nerve block fully wears off. Treating your pain at the first sign of the block wearing off will ensure your pain is better controlled and more tolerable when full-sensation returns. Do not wait until the pain is intolerable, as the medicine will be less effective. It is  better to treat pain in advance than to try and catch up.  General Anesthesia:  If you did not receive a nerve block during your surgery, you will need to start taking your pain medication shortly after your surgery and should continue to do so as prescribed by your surgeon.  Pain Medication:  Most commonly we prescribe Vicodin and Percocet for post-operative pain. Both of these medications contain a combination of acetaminophen (Tylenol) and a narcotic to help control pain.   It takes between 30 and 45 minutes before pain medication starts to work. It is important to take your medication before your pain level gets too intense.   Nausea is a common side effect of many pain medications. You will want to eat something before taking your pain medicine to help prevent nausea.   If you are taking a prescription pain medication that contains acetaminophen, we recommend that you do not take additional over the counter acetaminophen (Tylenol).  Other pain relieving options:   Using a cold pack to ice the affected area a few times a day (15 to 20 minutes at a time) can help to relieve pain, reduce swelling and bruising.   Elevation of the affected area can also help to reduce pain and swelling.  Per [facility] clinic policy, our goal is ensure optimal postoperative pain control with a multimodal pain management strategy. For all [facility] patients, our goal is to wean post-operative narcotic medications by 6 weeks post-operatively. If this is not possible due to utilization of pain medication prior to surgery, your [facility] doctor will support your acute post-operative pain control for the first 6  weeks postoperatively, with a plan to transition you back to your primary pain team following that. Cyndia Skeeters will work to ensure a Therapist, occupational.   Post Anesthesia Home Care Instructions  Activity: Get plenty of rest for the remainder of the day. A responsible individual must stay with you for 24 hours  following the procedure.  For the next 24 hours, DO NOT: -Drive a car -Advertising copywriter -Drink alcoholic beverages -Take any medication unless instructed by your physician -Make any legal decisions or sign important papers.  Meals: Start with liquid foods such as gelatin or soup. Progress to regular foods as tolerated. Avoid greasy, spicy, heavy foods. If nausea and/or vomiting occur, drink only clear liquids until the nausea and/or vomiting subsides. Call your physician if vomiting continues.  Special Instructions/Symptoms: Your throat may feel dry or sore from the anesthesia or the breathing tube placed in your throat during surgery. If this causes discomfort, gargle with warm salt water. The discomfort should disappear within 24 hours.  If you had a scopolamine patch placed behind your ear for the management of post- operative nausea and/or vomiting:  1. The medication in the patch is effective for 72 hours, after which it should be removed.  Wrap patch in a tissue and discard in the trash. Wash hands thoroughly with soap and water. 2. You may remove the patch earlier than 72 hours if you experience unpleasant side effects which may include dry mouth, dizziness or visual disturbances. 3. Avoid touching the patch. Wash your hands with soap and water after contact with the patch.

## 2023-08-14 NOTE — Anesthesia Postprocedure Evaluation (Signed)
Anesthesia Post Note  Patient: Andrea Hayes  Procedure(s) Performed: OPEN REDUCTION INTERNAL FIXATION (ORIF) 5TH METATARSAL (TOE) FRACTURE (Right: Toe)     Patient location during evaluation: PACU Anesthesia Type: MAC and Regional Level of consciousness: awake and alert Pain management: pain level controlled Vital Signs Assessment: post-procedure vital signs reviewed and stable Respiratory status: spontaneous breathing, nonlabored ventilation, respiratory function stable and patient connected to nasal cannula oxygen Cardiovascular status: stable and blood pressure returned to baseline Postop Assessment: no apparent nausea or vomiting Anesthetic complications: no  No notable events documented.  Last Vitals:  Vitals:   08/14/23 1456 08/14/23 1521  BP: 121/87 (!) 120/97  Pulse: 96 86  Resp: 16 20  Temp:  (!) 36.2 C  SpO2: 98% 99%    Last Pain:  Vitals:   08/14/23 1521  TempSrc: Temporal  PainSc: 0-No pain                 Trevor Iha

## 2023-08-14 NOTE — Progress Notes (Signed)
Assisted Dr. Richardson Landry with right, popliteal, ultrasound guided block. Side rails up, monitors on throughout procedure. See vital signs in flow sheet. Tolerated Procedure well.

## 2023-08-14 NOTE — Op Note (Signed)
   Date of Surgery: 08/14/2023  INDICATIONS: Andrea Hayes is a 33 y.o.-year-old female with a right 5th metatarsal Jones fracture.  The patient did consent to the procedure after discussion of the risks and benefits.  PREOPERATIVE DIAGNOSIS: Right 5th metatarsal jones fracture  POSTOPERATIVE DIAGNOSIS: Same.  PROCEDURE: Open treatment internal fixation of right 5th metatarsal Jones fracture  SURGEON: N. Glee Arvin, M.D.  ASSIST: Starlyn Skeans [place], [place]; necessary for the timely completion of procedure and due to complexity of procedure.  ANESTHESIA:  popliteal block  IV FLUIDS AND URINE: See anesthesia.  ESTIMATED BLOOD LOSS: minimal mL.  IMPLANTS: Arthrex 4.5 mm x 40 mm partially threaded screw  COMPLICATIONS: see description of procedure.  DESCRIPTION OF PROCEDURE: The patient was brought to the operating room.  The patient had been signed prior to the procedure and this was documented. The patient had the anesthesia placed by the anesthesiologist.  A time-out was performed to confirm that this was the correct patient, site, side and location. The patient did receive antibiotics prior to the incision and was re-dosed during the procedure as needed at indicated intervals. The patient had the operative extremity prepped and draped in the standard surgical fashion.    Fluoroscopy was brought in and a guidepin was used to mark the start site at the base of the fifth metatarsal.  This was confirmed on orthogonal views.  The skin was then percutaneously inserted through the skin onto the base of the fifth metatarsal and slowly advanced down the medullary canal using fluoroscopic guidance.  The pin was advanced to the proper depth.  The cannulated drill was advanced over the pin to the proper depth also confirming on serial fluoroscopy shots.  The drill was then removed and a 4.5 mm tap was advanced over the guidepin by hand to a depth of 40 mm.  The threads engaged cortex well distal to the  fracture.  The tap was then removed and the 4.5 mm x 40 mm screw was then inserted by hand to the proper depth under fluoroscopic guidance.  Again the threads engaged the cortex very well.  There was slight compression across the fracture that was visualized on fluoroscopy.  Final x-rays were taken.  Incision was thoroughly irrigated and closed with a 3-0 nylon.  Sterile dressings were applied.  Postop shoe was placed.  Patient tolerated procedure well had no immediate complications.  Andrea Hayes was necessary for opening, closing, retracting, limb positioning and overall facilitation and timely completion of the procedure.  POSTOPERATIVE PLAN: Patient will be discharged home.  She is to be nonweightbearing with crutches and a postop shoe.  Follow-up in 2 weeks for postop check.  She is to resume Xarelto tomorrow.  Mayra Reel, MD 2:05 PM

## 2023-08-15 ENCOUNTER — Telehealth: Payer: Self-pay | Admitting: Orthopaedic Surgery

## 2023-08-15 ENCOUNTER — Other Ambulatory Visit: Payer: Self-pay | Admitting: Physician Assistant

## 2023-08-15 ENCOUNTER — Encounter (HOSPITAL_BASED_OUTPATIENT_CLINIC_OR_DEPARTMENT_OTHER): Payer: Self-pay | Admitting: Orthopaedic Surgery

## 2023-08-15 MED ORDER — OXYCODONE-ACETAMINOPHEN 5-325 MG PO TABS: 1.0000 | ORAL_TABLET | Freq: Three times a day (TID) | ORAL | 0 refills | Status: AC | PRN

## 2023-08-15 NOTE — Telephone Encounter (Signed)
Patient called requesting for another pain medication for her foot. She advised she needs something stronger due to pain.

## 2023-08-15 NOTE — Telephone Encounter (Signed)
Notified patient. She states understanding.

## 2023-08-15 NOTE — Telephone Encounter (Signed)
I sent in small rx for percocet that cannot be refilled

## 2023-08-21 ENCOUNTER — Encounter: Payer: Medicaid Other | Admitting: Orthopaedic Surgery

## 2023-08-28 ENCOUNTER — Other Ambulatory Visit (INDEPENDENT_AMBULATORY_CARE_PROVIDER_SITE_OTHER): Payer: Self-pay

## 2023-08-28 ENCOUNTER — Ambulatory Visit (INDEPENDENT_AMBULATORY_CARE_PROVIDER_SITE_OTHER): Payer: No Typology Code available for payment source | Admitting: Orthopaedic Surgery

## 2023-08-28 ENCOUNTER — Encounter: Payer: Self-pay | Admitting: Orthopaedic Surgery

## 2023-08-28 DIAGNOSIS — S92354A Nondisplaced fracture of fifth metatarsal bone, right foot, initial encounter for closed fracture: Secondary | ICD-10-CM

## 2023-08-28 NOTE — Progress Notes (Signed)
 Post-Op Visit Note   Patient: Andrea Hayes           Date of Birth: 09-Nov-1990           MRN: [id number] Visit Date: 08/28/2023 PCP: Fleet Contras, MD   Assessment & Plan:  Chief Complaint:  Chief Complaint  Patient presents with   Right Foot - Follow-up    ORIF 5th metatarsal fracture 08/14/2023   Visit Diagnoses:  1. Nondisplaced fracture of fifth metatarsal bone, right foot, initial encounter for closed fracture     Plan: Patient is here for first postop check status post ORIF right fifth metatarsal Jones fracture.  She is doing well overall.  She has been taking her blood thinner.  She has been nonweightbearing with crutches and a postop shoe.  Examination shows a healed surgical incision.  No signs of infection or drainage.  Neurovascular intact.  X-rays show stable fixation alignment.  At this point she can begin to advance her weightbearing as tolerated with crutches and a postop shoe.  Sutures removed.  Recheck in 4 weeks with repeat radiographs of the right foot.  Follow-Up Instructions: Return in about 4 weeks (around 09/25/2023).   Orders:  Orders Placed This Encounter  Procedures   XR Foot Complete Right   Vitamin D (25 hydroxy)   Calcium   No orders of the defined types were placed in this encounter.   Imaging: XR Foot Complete Right Result Date: 08/28/2023 X-rays of the right foot show status post ORIF of the right fifth metatarsal fracture without any complications.  The fracture is anatomically aligned.   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   Acute respiratory failure with hypoxia (HCC) 01/26/2021   Chronic right arterial ischemic stroke, MCA (middle cerebral artery) 01/23/2021   Pulmonary embolism (HCC) 01/23/2021   Leukocytosis 01/23/2021   Thrombocytopenia (HCC) 01/23/2021   Elevated liver enzymes 01/23/2021   Chest pain 01/23/2021   Back pain 01/23/2021   Vaginal delivery 12/05/2020   Headache 11/28/2020   HTN (hypertension) 11/28/2020   Severe preeclampsia, delivered 11/04/2020   IUGR (intrauterine growth restriction) affecting care of mother 10/31/2020   Thrombocytopenia affecting pregnancy (HCC) 05/27/2020   Acute right arterial ischemic stroke, middle cerebral artery (MCA) (HCC)    Dural venous sinus thrombosis    Idiopathic intracranial hypertension    Cerebral venous thrombosis 07/24/2018   Past Medical History:  Diagnosis Date   Anemia    DVT (deep venous thrombosis) (HCC)    History of pulmonary embolus (PE)    [id number]   IIH (idiopathic intracranial hypertension)    Severe preeclampsia    Stroke (HCC) 10/30/2020   MCA   Thrombophilia (HCC)     Family History  Problem Relation Age of Onset   Asthma Father    Obesity Neg Hx    Hypertension Neg Hx    Diabetes Neg Hx    Cancer Neg Hx     Past Surgical History:  Procedure Laterality Date   BIOPSY  04/05/2021  Procedure: BIOPSY;  Surgeon: Jenel Lucks, MD;  Location: [facility] ENDOSCOPY;  Service: Gastroenterology;;   COLONOSCOPY N/A 04/05/2021   Procedure: COLONOSCOPY;  Surgeon: Jenel Lucks, MD;  Location: [facility] ENDOSCOPY;  Service: Gastroenterology;  Laterality: N/A;   DILATION AND EVACUATION N/A 09/25/2022   Procedure: DILATATION AND EVACUATION;  Surgeon: Adam Phenix, MD;  Location: [facility] OR;  Service: Gynecology;  Laterality: N/A;   ESOPHAGOGASTRODUODENOSCOPY N/A 04/05/2021   Procedure: ESOPHAGOGASTRODUODENOSCOPY (EGD);  Surgeon: Jenel Lucks, MD;   Location: [facility] ENDOSCOPY;  Service: Gastroenterology;  Laterality: N/A;   LOWER EXTREMITY VENOGRAPHY Right 01/22/2022   Procedure: LOWER EXTREMITY VENOGRAPHY;  Surgeon: Leonie Douglas, MD;  Location: [facility] INVASIVE CV LAB;  Service: Cardiovascular;  Laterality: Right;   ORIF TOE FRACTURE Right 08/14/2023   Procedure: OPEN REDUCTION INTERNAL FIXATION (ORIF) 5TH METATARSAL (TOE) FRACTURE;  Surgeon: Tarry Kos, MD;  Location: [facility];  Service: Orthopedics;  Laterality: Right;   PERIPHERAL VASCULAR BALLOON ANGIOPLASTY  03/24/2021   Procedure: PERIPHERAL VASCULAR BALLOON ANGIOPLASTY;  Surgeon: Leonie Douglas, MD;  Location: [facility] INVASIVE CV LAB;  Service: Cardiovascular;;  bilateral common iliacs   PERIPHERAL VASCULAR THROMBECTOMY N/A 03/23/2021   Procedure: PERIPHERAL VASCULAR THROMBECTOMY;  Surgeon: Maeola Harman, MD;  Location: [facility] INVASIVE CV LAB;  Service: Cardiovascular;  Laterality: N/A;   PERIPHERAL VASCULAR THROMBECTOMY N/A 03/24/2021   Procedure: LYSIS RECHECK;  Surgeon: Leonie Douglas, MD;  Location: [facility] INVASIVE CV LAB;  Service: Cardiovascular;  Laterality: N/A;   Social History   Occupational History   Not on file  Tobacco Use   Smoking status: Never    Passive exposure: Never   Smokeless tobacco: Never  Vaping Use   Vaping status: Never Used  Substance and Sexual Activity   Alcohol use: Never   Drug use: Never   Sexual activity: Yes    Birth control/protection: None

## 2023-08-29 ENCOUNTER — Telehealth: Payer: Self-pay | Admitting: Orthopaedic Surgery

## 2023-08-29 ENCOUNTER — Other Ambulatory Visit: Payer: Self-pay | Admitting: Obstetrics and Gynecology

## 2023-08-29 ENCOUNTER — Encounter: Payer: Self-pay | Admitting: Orthopaedic Surgery

## 2023-08-29 LAB — CALCIUM: Calcium: 9.8 mg/dL (ref 8.6–10.2)

## 2023-08-29 LAB — VITAMIN D 25 HYDROXY (VIT D DEFICIENCY, FRACTURES): Vit D, 25-Hydroxy: 99 ng/mL (ref 30–100)

## 2023-08-29 LAB — EXTRA LAV TOP TUBE

## 2023-08-29 NOTE — Telephone Encounter (Signed)
 Pt called requesting a work restriction note for employer. Please call pt about thus matter and when ready for pick up. Pt phone number is [phone].

## 2023-08-29 NOTE — Telephone Encounter (Signed)
 Messaged her back through Mychart as she originally messaged Dr.Xu.

## 2023-08-29 NOTE — Patient Outreach (Signed)
 RNCM called patient at her request.  Patient remains on Xarelto,  Recent surgery for fracture right foot-post op appointment yesterday-doing well.  All questions answered.  Patient to follow up with PCP as needed.  Has follow up with ORTHO scheduled.  Kathi Der RN, BSN, [employer] [facility] RN Care Manager Direct Dial [phone] [email or web address]

## 2023-08-29 NOTE — Telephone Encounter (Signed)
Out of work until follow up  

## 2023-09-25 ENCOUNTER — Telehealth: Payer: Self-pay

## 2023-09-25 NOTE — Telephone Encounter (Addendum)
 Medication Management: -pt left message stating she doctor to change Xarelto to Lovenox. -returned call- no answer -pt called back stating she is trying to get pregnant so she needs it changed  -MD consulted

## 2023-09-26 ENCOUNTER — Other Ambulatory Visit (INDEPENDENT_AMBULATORY_CARE_PROVIDER_SITE_OTHER): Payer: Self-pay

## 2023-09-26 ENCOUNTER — Ambulatory Visit (INDEPENDENT_AMBULATORY_CARE_PROVIDER_SITE_OTHER): Payer: Medicaid Other | Admitting: Orthopaedic Surgery

## 2023-09-26 DIAGNOSIS — S92354A Nondisplaced fracture of fifth metatarsal bone, right foot, initial encounter for closed fracture: Secondary | ICD-10-CM

## 2023-09-26 NOTE — Progress Notes (Signed)
 Post-Op Visit Note   Patient: Andrea Hayes           Date of Birth: 02-10-1991           MRN: [id number] Visit Date: 09/26/2023 PCP: Fleet Contras, MD   Assessment & Plan:  Chief Complaint:  Chief Complaint  Patient presents with   Right Foot - Follow-up   Visit Diagnoses:  1. Nondisplaced fracture of fifth metatarsal bone, right foot, initial encounter for closed fracture     Plan: Patient is here for postop check status post compression screw fixation for right fifth metatarsal fracture on 08/14/2023.  She is doing well overall.  She has minimal pain.  She has been walking well.  Examinations of the right foot shows a fully healed surgical scar.  She has no swelling.  No bony tenderness.  X-rays show that the fracture is completely healed.  She is walking well.  I do not think she needs formal PT.  She can wean out of the postop shoe.  Activity as tolerated.  Follow-up as needed.  Follow-Up Instructions: No follow-ups on file.   Orders:  Orders Placed This Encounter  Procedures   XR Foot Complete Right   No orders of the defined types were placed in this encounter.   Imaging: XR Foot Complete Right Result Date: 09/26/2023 X-rays of the right foot show a compression screw across the fifth metatarsal.  There is complete healing of the metatarsal fracture.   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   Acute respiratory failure with hypoxia (HCC) 01/26/2021   Chronic right arterial  ischemic stroke, MCA (middle cerebral artery) 01/23/2021   Pulmonary embolism (HCC) 01/23/2021   Leukocytosis 01/23/2021   Thrombocytopenia (HCC) 01/23/2021   Elevated liver enzymes 01/23/2021   Chest pain 01/23/2021   Back pain 01/23/2021   Vaginal delivery 12/05/2020   Headache 11/28/2020   HTN (hypertension) 11/28/2020   Severe preeclampsia, delivered 11/04/2020   IUGR (intrauterine growth restriction) affecting care of mother 10/31/2020   Thrombocytopenia affecting pregnancy (HCC) 05/27/2020   Acute right arterial ischemic stroke, middle cerebral artery (MCA) (HCC)    Dural venous sinus thrombosis    Idiopathic intracranial hypertension    Cerebral venous thrombosis 07/24/2018   Past Medical History:  Diagnosis Date   Anemia    DVT (deep venous thrombosis) (HCC)    History of pulmonary embolus (PE)    [id number]   IIH (idiopathic intracranial hypertension)    Severe preeclampsia    Stroke (HCC) 10/30/2020   MCA   Thrombophilia (HCC)     Family History  Problem Relation Age of Onset   Asthma Father    Obesity Neg Hx    Hypertension Neg Hx    Diabetes Neg Hx    Cancer Neg Hx     Past Surgical History:  Procedure Laterality Date   BIOPSY  04/05/2021   Procedure: BIOPSY;  Surgeon: Jenel Lucks, MD;  Location: [facility] ENDOSCOPY;  Service: Gastroenterology;;   COLONOSCOPY N/A 04/05/2021  Procedure: COLONOSCOPY;  Surgeon: Jenel Lucks, MD;  Location: [facility] ENDOSCOPY;  Service: Gastroenterology;  Laterality: N/A;   DILATION AND EVACUATION N/A 09/25/2022   Procedure: DILATATION AND EVACUATION;  Surgeon: Adam Phenix, MD;  Location: [facility] OR;  Service: Gynecology;  Laterality: N/A;   ESOPHAGOGASTRODUODENOSCOPY N/A 04/05/2021   Procedure: ESOPHAGOGASTRODUODENOSCOPY (EGD);  Surgeon: Jenel Lucks, MD;  Location: [facility] ENDOSCOPY;  Service: Gastroenterology;  Laterality: N/A;   LOWER EXTREMITY VENOGRAPHY Right 01/22/2022   Procedure: LOWER EXTREMITY VENOGRAPHY;  Surgeon:  Leonie Douglas, MD;  Location: [facility] INVASIVE CV LAB;  Service: Cardiovascular;  Laterality: Right;   ORIF TOE FRACTURE Right 08/14/2023   Procedure: OPEN REDUCTION INTERNAL FIXATION (ORIF) 5TH METATARSAL (TOE) FRACTURE;  Surgeon: Tarry Kos, MD;  Location: [facility];  Service: Orthopedics;  Laterality: Right;   PERIPHERAL VASCULAR BALLOON ANGIOPLASTY  03/24/2021   Procedure: PERIPHERAL VASCULAR BALLOON ANGIOPLASTY;  Surgeon: Leonie Douglas, MD;  Location: [facility] INVASIVE CV LAB;  Service: Cardiovascular;;  bilateral common iliacs   PERIPHERAL VASCULAR THROMBECTOMY N/A 03/23/2021   Procedure: PERIPHERAL VASCULAR THROMBECTOMY;  Surgeon: Maeola Harman, MD;  Location: [facility] INVASIVE CV LAB;  Service: Cardiovascular;  Laterality: N/A;   PERIPHERAL VASCULAR THROMBECTOMY N/A 03/24/2021   Procedure: LYSIS RECHECK;  Surgeon: Leonie Douglas, MD;  Location: [facility] INVASIVE CV LAB;  Service: Cardiovascular;  Laterality: N/A;   Social History   Occupational History   Not on file  Tobacco Use   Smoking status: Never    Passive exposure: Never   Smokeless tobacco: Never  Vaping Use   Vaping status: Never Used  Substance and Sexual Activity   Alcohol use: Never   Drug use: Never   Sexual activity: Yes    Birth control/protection: None

## 2023-10-16 ENCOUNTER — Encounter: Payer: Self-pay | Admitting: Orthopaedic Surgery

## 2023-10-22 ENCOUNTER — Other Ambulatory Visit: Payer: Self-pay

## 2023-10-22 DIAGNOSIS — S92354A Nondisplaced fracture of fifth metatarsal bone, right foot, initial encounter for closed fracture: Secondary | ICD-10-CM

## 2023-10-24 ENCOUNTER — Ambulatory Visit (HOSPITAL_COMMUNITY): Admission: RE | Admit: 2023-10-24 | Source: Ambulatory Visit

## 2023-10-24 ENCOUNTER — Other Ambulatory Visit (INDEPENDENT_AMBULATORY_CARE_PROVIDER_SITE_OTHER): Payer: Self-pay

## 2023-10-24 ENCOUNTER — Encounter: Payer: Self-pay | Admitting: Orthopaedic Surgery

## 2023-10-24 ENCOUNTER — Ambulatory Visit (INDEPENDENT_AMBULATORY_CARE_PROVIDER_SITE_OTHER): Admitting: Orthopaedic Surgery

## 2023-10-24 DIAGNOSIS — S92354A Nondisplaced fracture of fifth metatarsal bone, right foot, initial encounter for closed fracture: Secondary | ICD-10-CM

## 2023-10-24 NOTE — Progress Notes (Signed)
 Office Visit Note   Patient: Andrea Hayes           Date of Birth: 28-Jun-1991           MRN: [id number] Visit Date: 10/24/2023              Requested by: Charle Congo, MD [address] [place],  [place] [address] PCP: Charle Congo, MD   Assessment & Plan: Visit Diagnoses:  1. Nondisplaced fracture of fifth metatarsal bone, right foot, initial encounter for closed fracture     Plan: Due to her history of DVTs I will order ultrasound.  She has been fairly noncompliant with anticoagulation completely understand and due to the language barrier I have trouble sorting out what she is actually doing with the old box of Lovenox .  She originally saw Dr. Edgardo Goodwill of vascular surgery for the DVTs so I will send her back to him for official recommendations.  I recommend that she continue to wear compression for her foot.  I do not feel that her symptoms are related to the fracture but more from lower extremity edema.  She has no work restrictions from my standpoint.  Follow-Up Instructions: No follow-ups on file.   Orders:  Orders Placed This Encounter  Procedures   XR Foot Complete Right   Ambulatory referral to Vascular Surgery   VAS US  LOWER EXTREMITY VENOUS (DVT)   No orders of the defined types were placed in this encounter.     Procedures: No procedures performed   Clinical Data: No additional findings.   Subjective: Chief Complaint  Patient presents with   Right Foot - Follow-up    ORIF right 5th metatarsal fracture 08/14/2023    HPI Patient is a 33 year old female who returns for postop check.  She states that her legs are swollen and her feet burn while standing.  She has a history of DVT and PE.  She reports that she stopped taking Xarelto  because she was told she could not get pregnant due to it so she switched herself to Lovenox  and has been injecting herself daily.  The Lovenox  that she has was from an old prescription.  She is not sure if it is expired or not.   She denies any chest pain or shortness of breath. Review of Systems   Objective: Vital Signs: There were no vitals taken for this visit.  Physical Exam  Ortho Exam Exam of the right foot shows fully healed surgical scar.  No significant swelling.  She has some calf tenderness.  Negative Homans. Specialty Comments:  No specialty comments available.  Imaging: XR Foot Complete Right Result Date: 10/24/2023 X-rays of the foot show a fully healed fifth metatarsal fracture and intact hardware.    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   Acute respiratory failure with hypoxia (HCC) 01/26/2021   Chronic right arterial ischemic stroke, MCA (middle cerebral artery) 01/23/2021   Pulmonary embolism (HCC) 01/23/2021   Leukocytosis 01/23/2021   Thrombocytopenia (HCC) 01/23/2021   Elevated liver enzymes  01/23/2021   Chest pain 01/23/2021   Back pain 01/23/2021   Vaginal delivery 12/05/2020   Headache 11/28/2020   HTN (hypertension) 11/28/2020   Severe preeclampsia, delivered 11/04/2020   IUGR (intrauterine growth restriction) affecting care of mother 10/31/2020   Thrombocytopenia affecting pregnancy (HCC) 05/27/2020   Acute right arterial ischemic stroke, middle cerebral artery (MCA) (HCC)    Dural venous sinus thrombosis    Idiopathic intracranial hypertension    Cerebral venous thrombosis 07/24/2018   Past Medical History:  Diagnosis Date   Anemia    DVT (deep venous thrombosis) (HCC)     History of pulmonary embolus (PE)    [id number]   IIH (idiopathic intracranial hypertension)    Severe preeclampsia    Stroke (HCC) 10/30/2020   MCA   Thrombophilia (HCC)     Family History  Problem Relation Age of Onset   Asthma Father    Obesity Neg Hx    Hypertension Neg Hx    Diabetes Neg Hx    Cancer Neg Hx     Past Surgical History:  Procedure Laterality Date   BIOPSY  04/05/2021   Procedure: BIOPSY;  Surgeon: Elois Hair, MD;  Location: [facility] ENDOSCOPY;  Service: Gastroenterology;;   COLONOSCOPY N/A 04/05/2021   Procedure: COLONOSCOPY;  Surgeon: Elois Hair, MD;  Location: [facility] ENDOSCOPY;  Service: Gastroenterology;  Laterality: N/A;   DILATION AND EVACUATION N/A 09/25/2022   Procedure: DILATATION AND EVACUATION;  Surgeon: Tresia Fruit, MD;  Location: [facility] OR;  Service: Gynecology;  Laterality: N/A;   ESOPHAGOGASTRODUODENOSCOPY N/A 04/05/2021   Procedure: ESOPHAGOGASTRODUODENOSCOPY (EGD);  Surgeon: Elois Hair, MD;  Location: [facility] ENDOSCOPY;  Service: Gastroenterology;  Laterality: N/A;   LOWER EXTREMITY VENOGRAPHY Right 01/22/2022   Procedure: LOWER EXTREMITY VENOGRAPHY;  Surgeon: Carlene Che, MD;  Location: [facility] INVASIVE CV LAB;  Service: Cardiovascular;  Laterality: Right;   ORIF TOE FRACTURE Right 08/14/2023   Procedure: OPEN REDUCTION INTERNAL FIXATION (ORIF) 5TH METATARSAL (TOE) FRACTURE;  Surgeon: Wes Hamman, MD;  Location: [facility];  Service: Orthopedics;  Laterality: Right;   PERIPHERAL VASCULAR BALLOON ANGIOPLASTY  03/24/2021   Procedure: PERIPHERAL VASCULAR BALLOON ANGIOPLASTY;  Surgeon: Carlene Che, MD;  Location: [facility];  Service: Cardiovascular;;  bilateral common iliacs   PERIPHERAL VASCULAR THROMBECTOMY N/A 03/23/2021   Procedure: PERIPHERAL VASCULAR THROMBECTOMY;  Surgeon: Adine Hoof, MD;  Location: [facility] INVASIVE CV LAB;  Service: Cardiovascular;  Laterality: N/A;   PERIPHERAL VASCULAR  THROMBECTOMY N/A 03/24/2021   Procedure: LYSIS RECHECK;  Surgeon: Carlene Che, MD;  Location: [facility]VAS[facility] LAB;  Service: Cardiovascular;  Laterality: N/A;   Social History   Occupational History   Not on file  Tobacco Use   Smoking status: Never    Passive exposure: Never   Smokeless tobacco: Never  Vaping Use   Vaping status: Never Used  Substance and Sexual Activity   Alcohol use: Never   Drug use: Never   Sexual activity: Yes    Birth control/protection: None

## 2023-10-31 ENCOUNTER — Encounter: Payer: Self-pay | Admitting: Vascular Surgery

## 2023-12-05 ENCOUNTER — Other Ambulatory Visit: Payer: Self-pay | Admitting: Vascular Surgery

## 2024-01-02 ENCOUNTER — Other Ambulatory Visit: Payer: Self-pay | Admitting: Vascular Surgery

## 2024-01-02 DIAGNOSIS — I872 Venous insufficiency (chronic) (peripheral): Secondary | ICD-10-CM

## 2024-01-07 ENCOUNTER — Other Ambulatory Visit: Payer: Self-pay | Admitting: Vascular Surgery

## 2024-01-08 ENCOUNTER — Other Ambulatory Visit: Payer: Self-pay | Admitting: Vascular Surgery

## 2024-01-16 ENCOUNTER — Other Ambulatory Visit: Payer: Self-pay | Admitting: Vascular Surgery

## 2024-01-16 ENCOUNTER — Ambulatory Visit: Attending: Vascular Surgery | Admitting: Physician Assistant

## 2024-01-16 ENCOUNTER — Ambulatory Visit (HOSPITAL_COMMUNITY)
Admission: RE | Admit: 2024-01-16 | Discharge: 2024-01-16 | Disposition: A | Discharge status: Home / Self Care | Source: Ambulatory Visit | Attending: Vascular Surgery | Admitting: Vascular Surgery

## 2024-01-16 VITALS — BP 114/77 | HR 64 | Temp 97.6°F | Wt 153.6 lb

## 2024-01-16 DIAGNOSIS — I82401 Acute embolism and thrombosis of unspecified deep veins of right lower extremity: Secondary | ICD-10-CM | POA: Diagnosis not present

## 2024-01-16 DIAGNOSIS — I872 Venous insufficiency (chronic) (peripheral): Secondary | ICD-10-CM

## 2024-01-16 DIAGNOSIS — I82502 Chronic embolism and thrombosis of unspecified deep veins of left lower extremity: Secondary | ICD-10-CM

## 2024-01-16 DIAGNOSIS — M7989 Other specified soft tissue disorders: Secondary | ICD-10-CM

## 2024-01-16 MED ORDER — ENOXAPARIN SODIUM 80 MG/0.8ML IJ SOSY: 80.0000 mg | PREFILLED_SYRINGE | Freq: Two times a day (BID) | INTRAMUSCULAR | 11 refills | Status: AC

## 2024-01-16 NOTE — Progress Notes (Signed)
 Office Note     CC:  follow up Requesting Provider:  Jerri Kay HERO, MD  HPI: Andrea Hayes is a 33 y.o. (May 26, 1991) female who presents for evaluation of RLE swelling. She has history of acute DVT bilaterally. She was most recently seen in September of 2024 at which time she had new DVT in her LLE. She was started on Xarelto . She had previous DVT in the RLE in 2022. She underwent mechanical thrombectomy in September 2022 for this and was initially on Coumadin .   Today she reports over past couple weeks she has had some increased pain in her right leg mostly on ambulation or prolonged standing. She has not really noticed any increased swelling. She continues to wear her thigh high compression stockings. She does report not taking her Xarelto . She had a fall at work back in February and had required an ORIF of her 5th metatarsal and some time following her surgery and now she explains that she unknowingly had become pregnant and unfortunately miscarried and was told this was likely due to being on the Xarelto . She since has not been taking her Xarelto . She is here today requesting to be placed on Lovenox  so that she can try to conceive.   Past Medical History:  Diagnosis Date   Anemia    DVT (deep venous thrombosis) (HCC)    History of pulmonary embolus (PE)    [id number]   IIH (idiopathic intracranial hypertension)    Severe preeclampsia    Stroke (HCC) 10/30/2020   MCA   Thrombophilia (HCC)     Past Surgical History:  Procedure Laterality Date   BIOPSY  04/05/2021   Procedure: BIOPSY;  Surgeon: Stacia Glendia BRAVO, MD;  Location: [facility] ENDOSCOPY;  Service: Gastroenterology;;   COLONOSCOPY N/A 04/05/2021   Procedure: COLONOSCOPY;  Surgeon: Stacia Glendia BRAVO, MD;  Location: [facility] ENDOSCOPY;  Service: Gastroenterology;  Laterality: N/A;   DILATION AND EVACUATION N/A 09/25/2022   Procedure: DILATATION AND EVACUATION;  Surgeon: Eveline Lynwood MATSU, MD;  Location: [facility] OR;  Service: Gynecology;   Laterality: N/A;   ESOPHAGOGASTRODUODENOSCOPY N/A 04/05/2021   Procedure: ESOPHAGOGASTRODUODENOSCOPY (EGD);  Surgeon: Stacia Glendia BRAVO, MD;  Location: [facility] ENDOSCOPY;  Service: Gastroenterology;  Laterality: N/A;   LOWER EXTREMITY VENOGRAPHY Right 01/22/2022   Procedure: LOWER EXTREMITY VENOGRAPHY;  Surgeon: Magda Debby SAILOR, MD;  Location: [facility]VAS[facility] LAB;  Service: Cardiovascular;  Laterality: Right;   ORIF TOE FRACTURE Right 08/14/2023   Procedure: OPEN REDUCTION INTERNAL FIXATION (ORIF) 5TH METATARSAL (TOE) FRACTURE;  Surgeon: Jerri Kay HERO, MD;  Location: [facility];  Service: Orthopedics;  Laterality: Right;   PERIPHERAL VASCULAR BALLOON ANGIOPLASTY  03/24/2021   Procedure: PERIPHERAL VASCULAR BALLOON ANGIOPLASTY;  Surgeon: Magda Debby SAILOR, MD;  Location: [facility];  Service: Cardiovascular;;  bilateral common iliacs   PERIPHERAL VASCULAR THROMBECTOMY N/A 03/23/2021   Procedure: PERIPHERAL VASCULAR THROMBECTOMY;  Surgeon: Sheree Penne Bruckner, MD;  Location: [facility] INVASIVE CV LAB;  Service: Cardiovascular;  Laterality: N/A;   PERIPHERAL VASCULAR THROMBECTOMY N/A 03/24/2021   Procedure: LYSIS RECHECK;  Surgeon: Magda Debby SAILOR, MD;  Location: [facility] INVASIVE CV LAB;  Service: Cardiovascular;  Laterality: N/A;    Social History   Socioeconomic History   Marital status: Single    Spouse name: Not on file   Number of children: Not on file   Years of education: Not on file   Highest education level: Not on file  Occupational History   Not on file  Tobacco Use  Smoking status: Never    Passive exposure: Never   Smokeless tobacco: Never  Vaping Use   Vaping status: Never Used  Substance and Sexual Activity   Alcohol use: Never   Drug use: Never   Sexual activity: Yes    Birth control/protection: None  Other Topics Concern   Not on file  Social History Narrative   Right handed   Social Drivers of Health   Financial Resource Strain: Low Risk   (06/06/2021)   Overall Financial Resource Strain (CARDIA)    Difficulty of Paying Living Expenses: Not very hard  Food Insecurity: No Food Insecurity (06/06/2021)   Hunger Vital Sign    Worried About Running Out of Food in the Last Year: Never true    Ran Out of Food in the Last Year: Never true  Transportation Needs: No Transportation Needs (03/01/2023)   PRAPARE - Administrator, Civil Service (Medical): No    Lack of Transportation (Non-Medical): No  Physical Activity: Inactive (06/06/2021)   Exercise Vital Sign    Days of Exercise per Week: 0 days    Minutes of Exercise per Session: 0 min  Stress: No Stress Concern Present (06/06/2021)   [employer] of Occupational Health - Occupational Stress Questionnaire    Feeling of Stress : Only a little  Social Connections: Unknown (11/01/2021)   Received from [facility]   Social Network    Social Network: Not on file  Intimate Partner Violence: Not At Risk (08/08/2022)   Humiliation, Afraid, Rape, and Kick questionnaire    Fear of Current or Ex-Partner: No    Emotionally Abused: No    Physically Abused: No    Sexually Abused: No    Family History  Problem Relation Age of Onset   Asthma Father    Obesity Neg Hx    Hypertension Neg Hx    Diabetes Neg Hx    Cancer Neg Hx     Current Outpatient Medications  Medication Sig Dispense Refill   enoxaparin  (LOVENOX ) 80 MG/0.8ML injection Inject 0.8 mLs (80 mg total) into the skin every 12 (twelve) hours. 48 mL 11   HYDROcodone -acetaminophen  (NORCO/VICODIN) 5-325 MG tablet Take 1 tablet by mouth 3 (three) times daily as needed for moderate pain (pain score 4-6). To be taken after surgery 20 tablet 0   ondansetron  (ZOFRAN ) 4 MG tablet Take 1 tablet (4 mg total) by mouth every 8 (eight) hours as needed for nausea or vomiting. 40 tablet 0   oxyCODONE -acetaminophen  (PERCOCET) 5-325 MG tablet Take 1 tablet by mouth every 8 (eight) hours as needed. 15 tablet 0   Prenatal Vit-Fe  Fumarate-FA (PREPLUS) 27-1 MG TABS Take 1 tablet by mouth daily. 30 tablet 13   Vitamin D , Ergocalciferol , (DRISDOL) 1.25 MG (50000 UNIT) CAPS capsule Take 50,000 Units by mouth once a week.     No current facility-administered medications for this visit.    No Known Allergies   REVIEW OF SYSTEMS:  [X]  denotes positive finding, [ ]  denotes negative finding Cardiac  Comments:  Chest pain or chest pressure:    Shortness of breath upon exertion:    Short of breath when lying flat:    Irregular heart rhythm:        Vascular    Pain in calf, thigh, or hip brought on by ambulation:    Pain in feet at night that wakes you up from your sleep:     Blood clot in your veins: X   Leg swelling:  X       Pulmonary    Oxygen at home:    Productive cough:     Wheezing:         Neurologic    Sudden weakness in arms or legs:     Sudden numbness in arms or legs:     Sudden onset of difficulty speaking or slurred speech:    Temporary loss of vision in one eye:     Problems with dizziness:         Gastrointestinal    Blood in stool:     Vomited blood:         Genitourinary    Burning when urinating:     Blood in urine:        Psychiatric    Major depression:         Hematologic    Bleeding problems:    Problems with blood clotting too easily:        Skin    Rashes or ulcers:        Constitutional    Fever or chills:      PHYSICAL EXAMINATION:  Vitals:   01/16/24 1053  BP: 114/77  Pulse: 64  Temp: 97.6 F (36.4 C)  TempSrc: Oral  Weight: 153 lb 9.6 oz (69.7 kg)    General:  WDWN in NAD; vital signs documented above Gait: Normal HENT: WNL, normocephalic Pulmonary: normal non-labored breathing Cardiac: regular HR Abdomen: soft Vascular Exam/Pulses: 2+ femoral, 2+ DP. Feet warm and well perfused Extremities: BLE mildly edematous, reticular veins present, no ulceration, no lipodermatosclerosis, no hyperpigmentation Musculoskeletal: no muscle wasting or  atrophy  Neurologic: A&O X 3 Psychiatric:  The pt has Normal affect.   Non-Invasive Vascular Imaging:   VAS US  Lower Extremity Venous (DVT) Right: Summary:  RIGHT:  - Findings consistent with acute deep vein thrombosis involving the right femoral vein, SF junction, and right proximal profunda vein.  - Findings consistent with acute superficial vein thrombosis involving the right great saphenous vein.    - Findings consistent with age indeterminate deep vein thrombosis involving the right common femoral vein, and right popliteal vein.    - All other veins visualized appear fully compressible and demonstrate appropriate Doppler characteristics.    LEFT:  - Findings consistent with chronic deep vein thrombosis involving the left common femoral vein.      ASSESSMENT/PLAN:: 33 y.o. female here for follow up for RLE pain and swelling. She has history of BLE DVT. Now presenting with new onset RLE pain and swelling. She has not been compliant with her Xarelto  over at least the past month or more. Duplex today shows some age indeterminate thrombus in her RLE. She needs to be on lifelong anticoagulation and I re discussed this with her today. She is trying to conceive at this time so she wants transitioned to Lovenox . After discussion with our pharmacist, I have sent a new prescription for Lovenox  for her to start taking. This will be a every 12 hour injection. I have recommended she follow up with her OBGYN regarding her use of Lovenox  and higher risk pregnancy as well as monitoring. She voiced her understanding. I will bring her back for follow up in 3 months with BLE DVT duplex. She knows to call if she has any questions or concerns   Teretha Damme, PA-C [facility] [phone]  Clinic MD:   [place]

## 2024-01-20 ENCOUNTER — Other Ambulatory Visit: Payer: Self-pay

## 2024-01-20 DIAGNOSIS — I82401 Acute embolism and thrombosis of unspecified deep veins of right lower extremity: Secondary | ICD-10-CM

## 2024-03-13 ENCOUNTER — Encounter: Payer: Self-pay | Admitting: Orthopaedic Surgery

## 2024-03-23 NOTE — Telephone Encounter (Signed)
 I filled it out last week and gave it to lauren.

## 2024-03-23 NOTE — Telephone Encounter (Signed)
 Hi tisha.  Please see her message about the form I filled out.  Thanks.

## 2024-04-13 ENCOUNTER — Ambulatory Visit

## 2024-04-13 VITALS — BP 112/79 | HR 71

## 2024-04-13 DIAGNOSIS — Z3202 Encounter for pregnancy test, result negative: Secondary | ICD-10-CM | POA: Diagnosis not present

## 2024-04-13 DIAGNOSIS — Z3200 Encounter for pregnancy test, result unknown: Secondary | ICD-10-CM

## 2024-04-13 LAB — POCT URINE PREGNANCY: Preg Test, Ur: NEGATIVE

## 2024-04-13 NOTE — Progress Notes (Signed)
 Andrea Hayes here for a UPT. Pt had a negative upt at home. LMP is mid August (approximate).     UPT in office Negative.    Pt requests to schedule a provider visit for further assessment regarding missing her cycles. Provider visit to be scheduled at check out.

## 2024-04-16 ENCOUNTER — Ambulatory Visit (HOSPITAL_COMMUNITY)
Admission: RE | Admit: 2024-04-16 | Discharge: 2024-04-16 | Disposition: A | Discharge status: Home / Self Care | Source: Ambulatory Visit | Attending: Vascular Surgery | Admitting: Vascular Surgery

## 2024-04-16 ENCOUNTER — Ambulatory Visit (INDEPENDENT_AMBULATORY_CARE_PROVIDER_SITE_OTHER): Admitting: Physician Assistant

## 2024-04-16 VITALS — BP 99/69 | HR 80 | Temp 97.7°F | Wt 150.0 lb

## 2024-04-16 DIAGNOSIS — I82503 Chronic embolism and thrombosis of unspecified deep veins of lower extremity, bilateral: Secondary | ICD-10-CM

## 2024-04-16 DIAGNOSIS — I82401 Acute embolism and thrombosis of unspecified deep veins of right lower extremity: Secondary | ICD-10-CM | POA: Insufficient documentation

## 2024-04-16 DIAGNOSIS — M7989 Other specified soft tissue disorders: Secondary | ICD-10-CM

## 2024-04-16 NOTE — Progress Notes (Signed)
 Office Note   History of Present Illness   Andrea Hayes is a 33 y.o. (Jul 07, 1990) female who presents for DVT follow-up.  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  She also has a history of left lower extremity DVT in 2024. She was recently transitioned to Lovenox  for anticoagulation since she is trying to conceive.  She will need to be on lifelong anticoagulation.  She returns today for follow-up.  She says that she is doing well with giving herself Lovenox  injections daily.  She endorses continued, stable right lower extremity swelling.  This is somewhat uncomfortable for her, but she denies any pain.  She does use her thigh-high compression stockings, which helps significantly with her swelling.  She denies any left lower extremity swelling.  Past Medical History:  Diagnosis Date   Anemia    DVT (deep venous thrombosis) (HCC)    History of pulmonary embolus (PE)    [id number]   IIH (idiopathic intracranial hypertension)    Severe preeclampsia    Stroke (HCC) 10/30/2020   MCA   Thrombophilia     Past Surgical History:  Procedure Laterality Date   BIOPSY  04/05/2021   Procedure: BIOPSY;  Surgeon: Stacia Glendia BRAVO, MD;  Location: [facility]COPY;  Service: Gastroenterology;;   COLONOSCOPY N/A 04/05/2021   Procedure: COLONOSCOPY;  Surgeon: Stacia Glendia BRAVO, MD;  Location: [facility] ENDOSCOPY;  Service: Gastroenterology;  Laterality: N/A;   DILATION AND EVACUATION N/A 09/25/2022   Procedure: DILATATION AND EVACUATION;  Surgeon: Eveline Lynwood MATSU, MD;  Location: [facility] OR;  Service: Gynecology;  Laterality: N/A;   ESOPHAGOGASTRODUODENOSCOPY N/A 04/05/2021   Procedure: ESOPHAGOGASTRODUODENOSCOPY (EGD);  Surgeon: Stacia Glendia BRAVO, MD;  Location: [facility] ENDOSCOPY;  Service: Gastroenterology;  Laterality: N/A;   LOWER EXTREMITY VENOGRAPHY Right 01/22/2022   Procedure: LOWER EXTREMITY VENOGRAPHY;  Surgeon: Magda Debby SAILOR, MD;  Location:  [facility] INVAS[facility];  Service: Cardiovascular;  Laterality: Right;   ORIF TOE FRACTURE Right 08/14/2023   Procedure: OPEN REDUCTION INTERNAL FIXATION (ORIF) 5TH METATARSAL (TOE) FRACTURE;  Surgeon: Jerri Kay HERO, MD;  Location: [facility];  Service: Orthopedics;  Laterality: Right;   PERIPHERAL VASCULAR BALLOON ANGIOPLASTY  03/24/2021   Procedure: PERIPHERAL VASCULAR BALLOON ANGIOPLASTY;  Surgeon: Magda Debby SAILOR, MD;  Location: [facility] CV [facility];  Service: Cardiovascular;;  bilateral common iliacs   PERIPHERAL VASCULAR THROMBECTOMY N/A 03/23/2021   Procedure: PERIPHERAL VASCULAR THROMBECTOMY;  Surgeon: Sheree Penne Bruckner, MD;  Location: [facility] INVASIVE CV LAB;  Service: Cardiovascular;  Laterality: N/A;   PERIPHERAL VASCULAR THROMBECTOMY N/A 03/24/2021   Procedure: LYSIS RECHECK;  Surgeon: Magda Debby SAILOR, MD;  Location: [facility] CV [facility];  Service: Cardiovascular;  Laterality: N/A;    Social History   Socioeconomic History   Marital status: Single    Spouse name: Not on file   Number of children: Not on file   Years of education: Not on file   Highest education level: Not on file  Occupational History   Not on file  Tobacco Use   Smoking status: Never    Passive exposure: Never   Smokeless tobacco: Never  Vaping Use   Vaping status: Never Used  Substance and Sexual Activity   Alcohol use: Never   Drug use: Never   Sexual activity: Yes    Birth control/protection: None  Other Topics Concern   Not on file  Social  History Narrative   Right handed   Social Drivers of Health   Financial Resource Strain: Low Risk  (06/06/2021)   Overall Financial Resource Strain (CARDIA)    Difficulty of Paying Living Expenses: Not very hard  Food Insecurity: No Food Insecurity (06/06/2021)   Hunger Vital Sign    Worried About Running Out of Food in the Last Year: Never true    Ran Out of Food in the Last Year: Never true  Transportation Needs: No Transportation Needs  (03/01/2023)   PRAPARE - Administrator, Civil Service (Medical): No    Lack of Transportation (Non-Medical): No  Physical Activity: Inactive (06/06/2021)   Exercise Vital Sign    Days of Exercise per Week: 0 days    Minutes of Exercise per Session: 0 min  Stress: No Stress Concern Present (06/06/2021)   [employer] of Occupational Health - Occupational Stress Questionnaire    Feeling of Stress : Only a little  Social Connections: Unknown (11/01/2021)   Received from [facility]   Social Network    Social Network: Not on file  Intimate Partner Violence: Not At Risk (08/08/2022)   Humiliation, Afraid, Rape, and Kick questionnaire    Fear of Current or Ex-Partner: No    Emotionally Abused: No    Physically Abused: No    Sexually Abused: No    Family History  Problem Relation Age of Onset   Asthma Father    Obesity Neg Hx    Hypertension Neg Hx    Diabetes Neg Hx    Cancer Neg Hx     Current Outpatient Medications  Medication Sig Dispense Refill   HYDROcodone -acetaminophen  (NORCO/VICODIN) 5-325 MG tablet Take 1 tablet by mouth 3 (three) times daily as needed for moderate pain (pain score 4-6). To be taken after surgery 20 tablet 0   ondansetron  (ZOFRAN ) 4 MG tablet Take 1 tablet (4 mg total) by mouth every 8 (eight) hours as needed for nausea or vomiting. 40 tablet 0   enoxaparin  (LOVENOX ) 80 MG/0.8ML injection Inject 0.8 mLs (80 mg total) into the skin every 12 (twelve) hours. 48 mL 11   oxyCODONE -acetaminophen  (PERCOCET) 5-325 MG tablet Take 1 tablet by mouth every 8 (eight) hours as needed. 15 tablet 0   Prenatal Vit-Fe Fumarate-FA (PREPLUS) 27-1 MG TABS Take 1 tablet by mouth daily. 30 tablet 13   Vitamin D , Ergocalciferol , (DRISDOL) 1.25 MG (50000 UNIT) CAPS capsule Take 50,000 Units by mouth once a week.     No current facility-administered medications for this visit.    No Known Allergies  REVIEW OF SYSTEMS (negative unless checked):   Cardiac:  []   Chest pain or chest pressure? []  Shortness of breath upon activity? []  Shortness of breath when lying flat? []  Irregular heart rhythm?  Vascular:  []  Pain in calf, thigh, or hip brought on by walking? []  Pain in feet at night that wakes you up from your sleep? []  Blood clot in your veins? [x]  Leg swelling?  Pulmonary:  []  Oxygen at home? []  Productive cough? []  Wheezing?  Neurologic:  []  Sudden weakness in arms or legs? []  Sudden numbness in arms or legs? []  Sudden onset of difficult speaking or slurred speech? []  Temporary loss of vision in one eye? []  Problems with dizziness?  Gastrointestinal:  []  Blood in stool? []  Vomited blood?  Genitourinary:  []  Burning when urinating? []  Blood in urine?  Psychiatric:  []  Major depression  Hematologic:  []  Bleeding problems? []  Problems with blood  clotting?  Dermatologic:  []  Rashes or ulcers?  Constitutional:  []  Fever or chills?  Ear/Nose/Throat:  []  Change in hearing? []  Nose bleeds? []  Sore throat?  Musculoskeletal:  []  Back pain? []  Joint pain? []  Muscle pain?   Physical Examination     Vitals:   04/16/24 1240  BP: 99/69  Pulse: 80  Temp: 97.7 F (36.5 C)  TempSrc: Temporal  Weight: 150 lb (68 kg)   Body mass index is 26.57 kg/m.  General:  WDWN in NAD; vital signs documented above Gait: Not observed HENT: WNL, normocephalic Pulmonary: normal non-labored breathing , without Rales, rhonchi,  wheezing Cardiac: Regular Abdomen: soft, NT, no masses Skin: without rashes Vascular Exam/Pulses: 2+ DP pulses bilaterally Extremities: 1+ pitting edema of right lower leg.  No significant edema of left lower extremity Musculoskeletal: no muscle wasting or atrophy  Neurologic: A&O X 3;  No focal weakness or paresthesias are detected Psychiatric:  The pt has Normal affect.  Non-invasive Vascular Imaging   BLE DVT Study (04/16/2024):   +---------+---------------+---------+-----------+----------+---------------  --+  RIGHT   CompressibilityPhasicitySpontaneityPropertiesThrombus Aging      +---------+---------------+---------+-----------+----------+---------------  --+  CFV     Partial                                      Chronic             +---------+---------------+---------+-----------+----------+---------------  --+  SFJ     Partial                                      Chronic             +---------+---------------+---------+-----------+----------+---------------  --+  FV Prox  Partial                                      Age  Indeterminate  +---------+---------------+---------+-----------+----------+---------------  --+  FV Mid   Partial                                      Age  Indeterminate  +---------+---------------+---------+-----------+----------+---------------  --+  FV DistalPartial                                      Age  Indeterminate  +---------+---------------+---------+-----------+----------+---------------  --+  POP     Partial                                      Age  Indeterminate  +---------+---------------+---------+-----------+----------+---------------  --+  PTV     Full                                                             +---------+---------------+---------+-----------+----------+---------------  --+  GSV     Partial  Chronic             +---------+---------------+---------+-----------+----------+---------------  --+     +---------+---------------+---------+-----------+----------+---------------  --+  LEFT    CompressibilityPhasicitySpontaneityPropertiesThrombus Aging      +---------+---------------+---------+-----------+----------+---------------  --+  CFV     Partial                                      Chronic              +---------+---------------+---------+-----------+----------+---------------  --+  SFJ     Full                                                             +---------+---------------+---------+-----------+----------+---------------  --+  FV Prox  Partial                                      Age  Indeterminate  +---------+---------------+---------+-----------+----------+---------------  --+  FV Mid   Partial                                      Age  Indeterminate  +---------+---------------+---------+-----------+----------+---------------  --+  FV DistalPartial                                      Age  Indeterminate  +---------+---------------+---------+-----------+----------+---------------  --+  POP     Partial                                      Age  Indeterminate  +---------+---------------+---------+-----------+----------+---------------  --+  PTV     Full                                                             +---------+---------------+---------+-----------+----------+---------------  --+  PERO    Full                                                             +---------+---------------+---------+-----------+----------+---------------  --+  GSV     Full                                                             +---------+---------------+---------+-----------+----------+---------------  --+    Medical Decision Making   Myan Tena is a 33 y.o. female who presents for repeat DVT evaluation  The patient has  a history of bilateral lower extremity DVTs, with the right lower extremity in 2022 requiring mechanical thrombectomy and left lower extremity in 2024 Repeat bilateral lower extremity DVT study demonstrates no acute DVT.  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 The patient states that her right lower extremity  swelling remains about the same, however which usually gets worse later in the day.  This causes her some discomfort but no significant pain.  She does wear her thigh-high compression stockings daily, which helps a lot with her swelling.  She has also been consistent with her Lovenox  injections daily. On exam she has palpable pedal pulses bilaterally.  She has no significant edema in the left leg.  She does have 1+ pitting edema in the right lower leg and ankle. I have explained to the patient that she does not have any evidence of new DVT in the right lower extremity.  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  She is aware that she will need to be on anticoagulation for life.  I have encouraged her to continue to wear her compression stockings daily to help with her swelling.  I have also encouraged her to elevate her legs above her heart for at least 20 minutes daily to help with swelling. Given that her duplex and symptoms are stable, she does not need any further follow-up with our office at this time.  She can follow-up with our office as needed  Ahmed SHAUNNA Holster, PA-C [facility] Office: [phone]  04/16/2024, 12:46 PM  Clinic MD: [place]

## 2024-04-28 ENCOUNTER — Ambulatory Visit (INDEPENDENT_AMBULATORY_CARE_PROVIDER_SITE_OTHER)

## 2024-04-28 VITALS — BP 97/66 | HR 81 | Ht 63.0 in | Wt 150.9 lb

## 2024-04-28 DIAGNOSIS — N912 Amenorrhea, unspecified: Secondary | ICD-10-CM | POA: Diagnosis not present

## 2024-04-28 LAB — POCT URINE PREGNANCY: Preg Test, Ur: NEGATIVE

## 2024-04-28 NOTE — Progress Notes (Signed)
 Pt presents for missed periods with no pregnancy. No questions or concerns at this time.

## 2024-04-28 NOTE — Addendum Note (Signed)
 Addended by: JOMARIE CAMPI on: 04/28/2024 04:21 PM   Modules accepted: Orders

## 2024-04-28 NOTE — Progress Notes (Addendum)
   GYNECOLOGY PROGRESS NOTE  History:  33 y.o. G2P1011 presents to [facility] office today for problem gyn visit. She reports no periods since August. Estimated LMP of August 15th. She notes cramping in September and October where she thought she was starting her period, but denies any bleeding or spotting. She is actively trying to get pregnant at this time. Had a negative UPT October 13th in clinic. Has not taken any pregnancy tests at home.  She denies h/a, dizziness, shortness of breath, n/v, or fever/chills.    The following portions of the patient's history were reviewed and updated as appropriate: allergies, current medications, past family history, past medical history, past social history, past surgical history and problem list. Last pap smear on 05/14/2023 was abnormal, positive HRHPV, ASCUS.  Health Maintenance Due  Topic Date Due   Hepatitis B Vaccines 19-59 Average Risk (1 of 3 - 19+ 3-dose series) Never done   HPV VACCINES (1 - 3-dose SCDM series) Never done   Influenza Vaccine  Never done   COVID-19 Vaccine (1 - 2025-26 season) Never done     Review of Systems:  Pertinent items are noted in HPI.   Objective:  Physical Exam Blood pressure 97/66, pulse 81, height 5' 3 (1.6 m), weight 150 lb 14.4 oz (68.4 kg), last menstrual period 02/14/2024. VS reviewed, nursing note reviewed,  Constitutional: well developed, well nourished, no distress HEENT: normocephalic CV: normal rate Pulm/chest wall: normal effort Breast Exam: deferred Abdomen: soft Neuro: alert and oriented x 3 Skin: warm, dry Psych: affect normal Pelvic exam: deferred  Assessment & Plan:  1. No periods (Primary) Negative UPT in the office. Patient desires pregnancy. Discussed possibility of irregular periods as she has a history of irregular periods prior to her 1st child. Will do labs to rule out other causes at this time. Follow up in 2 weeks to discuss lab results and complete Pap smear. - POCT urine  pregnancy - Beta hCG quant (ref lab) - Estradiol - FSH/LH - Prolactin - Thyroid  Panel With TSH    Return in about 2 weeks (around 05/12/2024) for Missed periods follow-up, Pap smear.   Charlie DELENA Courts, MD 3:29 PM

## 2024-04-29 LAB — THYROID PANEL WITH TSH
Free Thyroxine Index: 2.1 (ref 1.2–4.9)
T3 Uptake Ratio: 21 % — ABNORMAL LOW (ref 24–39)
T4, Total: 10 ug/dL (ref 4.5–12.0)
TSH: 1.27 u[IU]/mL (ref 0.450–4.500)

## 2024-04-29 LAB — BETA HCG QUANT (REF LAB): hCG Quant: <1 m[IU]/mL

## 2024-04-29 LAB — ESTRADIOL: Estradiol: 90.6 pg/mL

## 2024-04-29 LAB — FSH/LH
FSH: 9.4 m[IU]/mL
LH: 23.9 m[IU]/mL

## 2024-04-29 LAB — PROLACTIN: Prolactin: 27.7 ng/mL (ref 4.8–33.4)

## 2024-05-02 ENCOUNTER — Ambulatory Visit: Payer: Self-pay

## 2024-05-04 ENCOUNTER — Encounter: Payer: Self-pay | Admitting: Radiology

## 2024-05-18 ENCOUNTER — Ambulatory Visit: Admitting: Obstetrics and Gynecology
